# Patient Record
Sex: Female | Born: 1960 | Race: White | Hispanic: No | Marital: Married | Smoking: Current every day smoker
Health system: Southern US, Community
[De-identification: ages and names within clinical notes are randomized; demographics above are authoritative.]

## PROBLEM LIST (undated history)

## (undated) DIAGNOSIS — Z720 Tobacco use: Secondary | ICD-10-CM

## (undated) DIAGNOSIS — E785 Hyperlipidemia, unspecified: Secondary | ICD-10-CM

## (undated) DIAGNOSIS — E039 Hypothyroidism, unspecified: Secondary | ICD-10-CM

## (undated) DIAGNOSIS — L209 Atopic dermatitis, unspecified: Secondary | ICD-10-CM

## (undated) DIAGNOSIS — E119 Type 2 diabetes mellitus without complications: Secondary | ICD-10-CM

## (undated) DIAGNOSIS — I10 Essential (primary) hypertension: Secondary | ICD-10-CM

## (undated) DIAGNOSIS — J302 Other seasonal allergic rhinitis: Secondary | ICD-10-CM

## (undated) DIAGNOSIS — J45909 Unspecified asthma, uncomplicated: Secondary | ICD-10-CM

## (undated) DIAGNOSIS — K922 Gastrointestinal hemorrhage, unspecified: Secondary | ICD-10-CM

## (undated) DIAGNOSIS — F329 Major depressive disorder, single episode, unspecified: Secondary | ICD-10-CM

## (undated) DIAGNOSIS — I2510 Atherosclerotic heart disease of native coronary artery without angina pectoris: Secondary | ICD-10-CM

## (undated) DIAGNOSIS — F32A Depression, unspecified: Secondary | ICD-10-CM

## (undated) DIAGNOSIS — K219 Gastro-esophageal reflux disease without esophagitis: Secondary | ICD-10-CM

## (undated) DIAGNOSIS — L409 Psoriasis, unspecified: Secondary | ICD-10-CM

## (undated) DIAGNOSIS — J449 Chronic obstructive pulmonary disease, unspecified: Secondary | ICD-10-CM

## (undated) HISTORY — DX: Depression, unspecified: F32.A

## (undated) HISTORY — DX: Atherosclerotic heart disease of native coronary artery without angina pectoris: I25.10

## (undated) HISTORY — DX: Essential (primary) hypertension: I10

## (undated) HISTORY — DX: Major depressive disorder, single episode, unspecified: F32.9

## (undated) HISTORY — PX: DILATION AND CURETTAGE OF UTERUS: SHX78

## (undated) HISTORY — DX: Hypothyroidism, unspecified: E03.9

## (undated) HISTORY — DX: Tobacco use: Z72.0

## (undated) HISTORY — DX: Psoriasis, unspecified: L40.9

## (undated) HISTORY — DX: Type 2 diabetes mellitus without complications: E11.9

## (undated) HISTORY — PX: CORONARY ANGIOPLASTY WITH STENT PLACEMENT: SHX49

## (undated) HISTORY — PX: CARPAL TUNNEL RELEASE: SHX101

## (undated) HISTORY — DX: Gastro-esophageal reflux disease without esophagitis: K21.9

## (undated) HISTORY — DX: Hyperlipidemia, unspecified: E78.5

---

## 1997-06-23 ENCOUNTER — Other Ambulatory Visit: Admission: RE | Admit: 1997-06-23 | Discharge: 1997-06-23 | Payer: Self-pay | Admitting: Obstetrics and Gynecology

## 1997-08-02 ENCOUNTER — Ambulatory Visit (HOSPITAL_COMMUNITY): Admission: RE | Admit: 1997-08-02 | Discharge: 1997-08-02 | Payer: Self-pay | Admitting: Obstetrics and Gynecology

## 1997-08-08 ENCOUNTER — Ambulatory Visit (HOSPITAL_COMMUNITY): Admission: RE | Admit: 1997-08-08 | Discharge: 1997-08-08 | Payer: Self-pay | Admitting: Obstetrics and Gynecology

## 1998-03-01 ENCOUNTER — Other Ambulatory Visit: Admission: RE | Admit: 1998-03-01 | Discharge: 1998-03-01 | Payer: Self-pay | Admitting: *Deleted

## 1998-07-13 ENCOUNTER — Other Ambulatory Visit: Admission: RE | Admit: 1998-07-13 | Discharge: 1998-07-13 | Payer: Self-pay | Admitting: *Deleted

## 1999-03-25 ENCOUNTER — Other Ambulatory Visit: Admission: RE | Admit: 1999-03-25 | Discharge: 1999-03-25 | Payer: Self-pay | Admitting: *Deleted

## 1999-06-17 ENCOUNTER — Encounter: Payer: Self-pay | Admitting: *Deleted

## 1999-06-17 ENCOUNTER — Encounter: Admission: RE | Admit: 1999-06-17 | Discharge: 1999-06-17 | Payer: Self-pay | Admitting: *Deleted

## 2000-03-30 ENCOUNTER — Other Ambulatory Visit: Admission: RE | Admit: 2000-03-30 | Discharge: 2000-03-30 | Payer: Self-pay | Admitting: *Deleted

## 2000-08-27 ENCOUNTER — Encounter: Payer: Self-pay | Admitting: *Deleted

## 2000-08-27 ENCOUNTER — Encounter: Admission: RE | Admit: 2000-08-27 | Discharge: 2000-08-27 | Payer: Self-pay | Admitting: *Deleted

## 2000-11-10 ENCOUNTER — Other Ambulatory Visit: Admission: RE | Admit: 2000-11-10 | Discharge: 2000-11-10 | Payer: Self-pay | Admitting: *Deleted

## 2001-08-11 ENCOUNTER — Encounter: Admission: RE | Admit: 2001-08-11 | Discharge: 2001-08-11 | Payer: Self-pay | Admitting: Internal Medicine

## 2001-08-11 ENCOUNTER — Encounter: Payer: Self-pay | Admitting: Internal Medicine

## 2001-08-17 ENCOUNTER — Encounter: Admission: RE | Admit: 2001-08-17 | Discharge: 2001-09-02 | Payer: Self-pay | Admitting: Internal Medicine

## 2002-08-08 ENCOUNTER — Emergency Department (HOSPITAL_COMMUNITY): Admission: EM | Admit: 2002-08-08 | Discharge: 2002-08-08 | Payer: Self-pay | Admitting: Emergency Medicine

## 2003-01-22 ENCOUNTER — Emergency Department (HOSPITAL_COMMUNITY): Admission: EM | Admit: 2003-01-22 | Discharge: 2003-01-22 | Payer: Self-pay | Admitting: Emergency Medicine

## 2003-05-31 ENCOUNTER — Emergency Department (HOSPITAL_COMMUNITY): Admission: EM | Admit: 2003-05-31 | Discharge: 2003-05-31 | Payer: Self-pay | Admitting: Emergency Medicine

## 2003-06-06 ENCOUNTER — Emergency Department (HOSPITAL_COMMUNITY): Admission: EM | Admit: 2003-06-06 | Discharge: 2003-06-06 | Payer: Self-pay | Admitting: Family Medicine

## 2003-06-09 ENCOUNTER — Encounter (HOSPITAL_COMMUNITY): Admission: RE | Admit: 2003-06-09 | Discharge: 2003-09-07 | Payer: Self-pay | Admitting: Family Medicine

## 2003-10-23 ENCOUNTER — Encounter: Admission: RE | Admit: 2003-10-23 | Discharge: 2003-10-23 | Payer: Self-pay | Admitting: Occupational Medicine

## 2003-11-30 ENCOUNTER — Emergency Department (HOSPITAL_COMMUNITY): Admission: EM | Admit: 2003-11-30 | Discharge: 2003-11-30 | Payer: Self-pay | Admitting: Family Medicine

## 2004-05-06 ENCOUNTER — Emergency Department (HOSPITAL_COMMUNITY): Admission: EM | Admit: 2004-05-06 | Discharge: 2004-05-06 | Payer: Self-pay | Admitting: Family Medicine

## 2004-06-12 ENCOUNTER — Encounter: Admission: RE | Admit: 2004-06-12 | Discharge: 2004-06-12 | Payer: Self-pay | Admitting: Internal Medicine

## 2004-09-18 ENCOUNTER — Emergency Department (HOSPITAL_COMMUNITY): Admission: EM | Admit: 2004-09-18 | Discharge: 2004-09-18 | Payer: Self-pay | Admitting: Family Medicine

## 2005-01-07 ENCOUNTER — Emergency Department (HOSPITAL_COMMUNITY): Admission: EM | Admit: 2005-01-07 | Discharge: 2005-01-07 | Payer: Self-pay | Admitting: Family Medicine

## 2005-03-12 ENCOUNTER — Emergency Department (HOSPITAL_COMMUNITY): Admission: EM | Admit: 2005-03-12 | Discharge: 2005-03-12 | Payer: Self-pay | Admitting: Family Medicine

## 2005-05-21 ENCOUNTER — Emergency Department (HOSPITAL_COMMUNITY): Admission: EM | Admit: 2005-05-21 | Discharge: 2005-05-21 | Payer: Self-pay | Admitting: Family Medicine

## 2005-07-02 ENCOUNTER — Emergency Department (HOSPITAL_COMMUNITY): Admission: EM | Admit: 2005-07-02 | Discharge: 2005-07-02 | Payer: Self-pay | Admitting: Family Medicine

## 2005-08-01 ENCOUNTER — Other Ambulatory Visit: Admission: RE | Admit: 2005-08-01 | Discharge: 2005-08-01 | Payer: Self-pay | Admitting: Internal Medicine

## 2005-10-25 ENCOUNTER — Emergency Department (HOSPITAL_COMMUNITY): Admission: EM | Admit: 2005-10-25 | Discharge: 2005-10-25 | Payer: Self-pay | Admitting: Emergency Medicine

## 2006-04-12 ENCOUNTER — Emergency Department (HOSPITAL_COMMUNITY): Admission: EM | Admit: 2006-04-12 | Discharge: 2006-04-12 | Payer: Self-pay | Admitting: Emergency Medicine

## 2006-06-18 ENCOUNTER — Emergency Department (HOSPITAL_COMMUNITY): Admission: EM | Admit: 2006-06-18 | Discharge: 2006-06-18 | Payer: Self-pay | Admitting: Emergency Medicine

## 2006-06-24 ENCOUNTER — Emergency Department (HOSPITAL_COMMUNITY): Admission: EM | Admit: 2006-06-24 | Discharge: 2006-06-25 | Payer: Self-pay | Admitting: *Deleted

## 2006-08-18 ENCOUNTER — Emergency Department (HOSPITAL_COMMUNITY): Admission: EM | Admit: 2006-08-18 | Discharge: 2006-08-18 | Payer: Self-pay | Admitting: Emergency Medicine

## 2006-09-11 ENCOUNTER — Emergency Department (HOSPITAL_COMMUNITY): Admission: EM | Admit: 2006-09-11 | Discharge: 2006-09-11 | Payer: Self-pay | Admitting: Emergency Medicine

## 2007-01-05 ENCOUNTER — Emergency Department (HOSPITAL_COMMUNITY): Admission: EM | Admit: 2007-01-05 | Discharge: 2007-01-05 | Payer: Self-pay | Admitting: *Deleted

## 2007-01-20 ENCOUNTER — Emergency Department (HOSPITAL_COMMUNITY): Admission: EM | Admit: 2007-01-20 | Discharge: 2007-01-20 | Payer: Self-pay | Admitting: Emergency Medicine

## 2007-01-22 ENCOUNTER — Ambulatory Visit (HOSPITAL_COMMUNITY): Admission: RE | Admit: 2007-01-22 | Discharge: 2007-01-22 | Payer: Self-pay | Admitting: Emergency Medicine

## 2007-02-10 ENCOUNTER — Emergency Department (HOSPITAL_COMMUNITY): Admission: EM | Admit: 2007-02-10 | Discharge: 2007-02-10 | Payer: Self-pay | Admitting: Emergency Medicine

## 2007-04-29 ENCOUNTER — Emergency Department (HOSPITAL_COMMUNITY): Admission: EM | Admit: 2007-04-29 | Discharge: 2007-04-30 | Payer: Self-pay | Admitting: Emergency Medicine

## 2007-07-29 ENCOUNTER — Emergency Department (HOSPITAL_COMMUNITY): Admission: EM | Admit: 2007-07-29 | Discharge: 2007-07-29 | Payer: Self-pay | Admitting: Family Medicine

## 2007-09-05 ENCOUNTER — Emergency Department (HOSPITAL_COMMUNITY): Admission: EM | Admit: 2007-09-05 | Discharge: 2007-09-05 | Payer: Self-pay | Admitting: Emergency Medicine

## 2007-09-05 ENCOUNTER — Emergency Department (HOSPITAL_COMMUNITY): Admission: EM | Admit: 2007-09-05 | Discharge: 2007-09-05 | Payer: Self-pay | Admitting: Family Medicine

## 2007-11-27 ENCOUNTER — Emergency Department (HOSPITAL_COMMUNITY): Admission: EM | Admit: 2007-11-27 | Discharge: 2007-11-27 | Payer: Self-pay | Admitting: Family Medicine

## 2007-12-21 ENCOUNTER — Emergency Department (HOSPITAL_COMMUNITY): Admission: EM | Admit: 2007-12-21 | Discharge: 2007-12-21 | Payer: Self-pay | Admitting: Family Medicine

## 2008-01-16 ENCOUNTER — Emergency Department (HOSPITAL_COMMUNITY): Admission: EM | Admit: 2008-01-16 | Discharge: 2008-01-16 | Payer: Self-pay | Admitting: Emergency Medicine

## 2008-02-03 ENCOUNTER — Emergency Department (HOSPITAL_COMMUNITY): Admission: EM | Admit: 2008-02-03 | Discharge: 2008-02-04 | Payer: Self-pay | Admitting: Emergency Medicine

## 2008-03-16 ENCOUNTER — Emergency Department (HOSPITAL_COMMUNITY): Admission: EM | Admit: 2008-03-16 | Discharge: 2008-03-16 | Payer: Self-pay | Admitting: Family Medicine

## 2008-04-09 ENCOUNTER — Emergency Department (HOSPITAL_COMMUNITY): Admission: EM | Admit: 2008-04-09 | Discharge: 2008-04-09 | Payer: Self-pay | Admitting: Family Medicine

## 2008-04-17 ENCOUNTER — Emergency Department (HOSPITAL_COMMUNITY): Admission: EM | Admit: 2008-04-17 | Discharge: 2008-04-17 | Payer: Self-pay | Admitting: Emergency Medicine

## 2008-04-27 ENCOUNTER — Ambulatory Visit: Payer: Self-pay | Admitting: Internal Medicine

## 2008-04-27 ENCOUNTER — Encounter: Payer: Self-pay | Admitting: Internal Medicine

## 2008-04-27 DIAGNOSIS — J309 Allergic rhinitis, unspecified: Secondary | ICD-10-CM | POA: Insufficient documentation

## 2008-04-27 DIAGNOSIS — K219 Gastro-esophageal reflux disease without esophagitis: Secondary | ICD-10-CM | POA: Insufficient documentation

## 2008-04-27 DIAGNOSIS — E039 Hypothyroidism, unspecified: Secondary | ICD-10-CM | POA: Insufficient documentation

## 2008-04-27 DIAGNOSIS — E785 Hyperlipidemia, unspecified: Secondary | ICD-10-CM | POA: Insufficient documentation

## 2008-04-27 DIAGNOSIS — J45909 Unspecified asthma, uncomplicated: Secondary | ICD-10-CM | POA: Insufficient documentation

## 2008-04-27 DIAGNOSIS — F329 Major depressive disorder, single episode, unspecified: Secondary | ICD-10-CM | POA: Insufficient documentation

## 2008-04-27 DIAGNOSIS — F32A Depression, unspecified: Secondary | ICD-10-CM | POA: Insufficient documentation

## 2008-04-27 DIAGNOSIS — R21 Rash and other nonspecific skin eruption: Secondary | ICD-10-CM | POA: Insufficient documentation

## 2008-04-28 ENCOUNTER — Encounter: Payer: Self-pay | Admitting: Internal Medicine

## 2008-04-28 LAB — CONVERTED CEMR LAB
Basophils Absolute: 0.1 10*3/uL (ref 0.0–0.1)
Basophils Relative: 0 % (ref 0–1)
Eosinophils Absolute: 0.6 10*3/uL (ref 0.0–0.7)
Eosinophils Relative: 5 % (ref 0–5)
HCT: 44.4 % (ref 36.0–46.0)
Hemoglobin: 15.3 g/dL — ABNORMAL HIGH (ref 12.0–15.0)
Lymphocytes Relative: 33 % (ref 12–46)
Lymphs Abs: 3.9 10*3/uL (ref 0.7–4.0)
MCHC: 34.5 g/dL (ref 30.0–36.0)
MCV: 92.7 fL (ref 78.0–100.0)
Monocytes Absolute: 0.9 10*3/uL (ref 0.1–1.0)
Monocytes Relative: 8 % (ref 3–12)
Neutro Abs: 6.5 10*3/uL (ref 1.7–7.7)
Neutrophils Relative %: 54 % (ref 43–77)
Platelets: 262 10*3/uL (ref 150–400)
RBC: 4.79 M/uL (ref 3.87–5.11)
RDW: 13.4 % (ref 11.5–15.5)
WBC: 12 10*3/uL — ABNORMAL HIGH (ref 4.0–10.5)

## 2008-05-09 LAB — CONVERTED CEMR LAB
ALT: 29 units/L (ref 0–35)
AST: 22 units/L (ref 0–37)
Albumin: 4.4 g/dL (ref 3.5–5.2)
Alkaline Phosphatase: 83 units/L (ref 39–117)
BUN: 13 mg/dL (ref 6–23)
CO2: 24 meq/L (ref 19–32)
Calcium: 9.6 mg/dL (ref 8.4–10.5)
Chloride: 103 meq/L (ref 96–112)
Cholesterol: 231 mg/dL — ABNORMAL HIGH (ref 0–200)
Creatinine, Ser: 0.79 mg/dL (ref 0.40–1.20)
Free T4: 0.63 ng/dL — ABNORMAL LOW (ref 0.89–1.80)
Glucose, Bld: 111 mg/dL — ABNORMAL HIGH (ref 70–99)
HDL: 32 mg/dL — ABNORMAL LOW (ref >39)
LDL Cholesterol: 148 mg/dL — ABNORMAL HIGH (ref 0–99)
Potassium: 4.2 meq/L (ref 3.5–5.3)
Sodium: 142 meq/L (ref 135–145)
TSH: 12.089 microintl units/mL — ABNORMAL HIGH (ref 0.350–4.50)
Total Bilirubin: 0.3 mg/dL (ref 0.3–1.2)
Total CHOL/HDL Ratio: 7.2
Total Protein: 7.1 g/dL (ref 6.0–8.3)
Triglycerides: 254 mg/dL — ABNORMAL HIGH (ref <150)
VLDL: 51 mg/dL — ABNORMAL HIGH (ref 0–40)

## 2008-05-10 ENCOUNTER — Encounter (INDEPENDENT_AMBULATORY_CARE_PROVIDER_SITE_OTHER): Payer: Self-pay | Admitting: *Deleted

## 2008-05-10 ENCOUNTER — Encounter: Payer: Self-pay | Admitting: Internal Medicine

## 2008-05-10 ENCOUNTER — Other Ambulatory Visit: Admission: RE | Admit: 2008-05-10 | Discharge: 2008-05-10 | Payer: Self-pay | Admitting: *Deleted

## 2008-05-10 ENCOUNTER — Ambulatory Visit: Payer: Self-pay | Admitting: *Deleted

## 2008-05-10 DIAGNOSIS — I152 Hypertension secondary to endocrine disorders: Secondary | ICD-10-CM | POA: Insufficient documentation

## 2008-05-10 DIAGNOSIS — I10 Essential (primary) hypertension: Secondary | ICD-10-CM

## 2008-05-10 DIAGNOSIS — E1159 Type 2 diabetes mellitus with other circulatory complications: Secondary | ICD-10-CM | POA: Insufficient documentation

## 2008-05-11 ENCOUNTER — Encounter (INDEPENDENT_AMBULATORY_CARE_PROVIDER_SITE_OTHER): Payer: Self-pay | Admitting: *Deleted

## 2008-06-10 ENCOUNTER — Emergency Department (HOSPITAL_COMMUNITY): Admission: EM | Admit: 2008-06-10 | Discharge: 2008-06-10 | Payer: Self-pay | Admitting: Family Medicine

## 2008-06-20 ENCOUNTER — Ambulatory Visit: Payer: Self-pay | Admitting: Internal Medicine

## 2008-06-20 ENCOUNTER — Encounter: Payer: Self-pay | Admitting: Internal Medicine

## 2008-06-20 DIAGNOSIS — R82998 Other abnormal findings in urine: Secondary | ICD-10-CM | POA: Insufficient documentation

## 2008-06-20 DIAGNOSIS — K921 Melena: Secondary | ICD-10-CM | POA: Insufficient documentation

## 2008-06-20 DIAGNOSIS — J069 Acute upper respiratory infection, unspecified: Secondary | ICD-10-CM | POA: Insufficient documentation

## 2008-06-21 ENCOUNTER — Encounter: Payer: Self-pay | Admitting: Internal Medicine

## 2008-06-26 LAB — CONVERTED CEMR LAB
BUN: 13 mg/dL (ref 6–23)
Bacteria, UA: NONE SEEN
Bilirubin Urine: NEGATIVE
CO2: 23 meq/L (ref 19–32)
Calcium: 9.5 mg/dL (ref 8.4–10.5)
Chloride: 103 meq/L (ref 96–112)
Creatinine, Ser: 0.8 mg/dL (ref 0.40–1.20)
Glucose, Bld: 146 mg/dL — ABNORMAL HIGH (ref 70–99)
Hemoglobin, Urine: NEGATIVE
Ketones, ur: NEGATIVE mg/dL
Leukocytes, UA: NEGATIVE
Nitrite: NEGATIVE
Potassium: 4.2 meq/L (ref 3.5–5.3)
Protein, ur: 100 mg/dL — AB
Sodium: 139 meq/L (ref 135–145)
Specific Gravity, Urine: 1.02 (ref 1.005–1.030)
TSH: 3.105 microintl units/mL (ref 0.350–4.500)
Urine Glucose: NEGATIVE mg/dL
Urobilinogen, UA: 0.2 (ref 0.0–1.0)
WBC, UA: NONE SEEN cells/hpf (ref <3)
pH: 6 (ref 5.0–8.0)

## 2008-06-27 ENCOUNTER — Ambulatory Visit: Payer: Self-pay | Admitting: Internal Medicine

## 2008-06-27 DIAGNOSIS — R3129 Other microscopic hematuria: Secondary | ICD-10-CM | POA: Insufficient documentation

## 2008-06-27 LAB — CONVERTED CEMR LAB: Hgb A1c MFr Bld: 6.1 %

## 2008-06-29 ENCOUNTER — Ambulatory Visit: Payer: Self-pay | Admitting: Internal Medicine

## 2008-06-29 ENCOUNTER — Encounter (INDEPENDENT_AMBULATORY_CARE_PROVIDER_SITE_OTHER): Payer: Self-pay | Admitting: Internal Medicine

## 2008-06-30 LAB — CONVERTED CEMR LAB
BUN: 16 mg/dL (ref 6–23)
CO2: 26 meq/L (ref 19–32)
Calcium: 9.5 mg/dL (ref 8.4–10.5)
Chloride: 105 meq/L (ref 96–112)
Creatinine, Ser: 0.74 mg/dL (ref 0.40–1.20)
GFR calc Af Amer: >60 mL/min (ref >60)
GFR calc non Af Amer: >60 mL/min (ref >60)
Glucose, Bld: 108 mg/dL — ABNORMAL HIGH (ref 70–99)
Potassium: 4 meq/L (ref 3.5–5.3)
Sodium: 140 meq/L (ref 135–145)

## 2008-07-25 ENCOUNTER — Ambulatory Visit: Payer: Self-pay | Admitting: Internal Medicine

## 2008-07-25 ENCOUNTER — Encounter: Payer: Self-pay | Admitting: Internal Medicine

## 2008-07-25 LAB — HM PAP SMEAR

## 2008-08-24 ENCOUNTER — Ambulatory Visit: Payer: Self-pay | Admitting: Internal Medicine

## 2008-08-24 DIAGNOSIS — M25519 Pain in unspecified shoulder: Secondary | ICD-10-CM | POA: Insufficient documentation

## 2008-09-06 ENCOUNTER — Telehealth: Payer: Self-pay | Admitting: Infectious Diseases

## 2008-09-19 ENCOUNTER — Emergency Department (HOSPITAL_COMMUNITY): Admission: EM | Admit: 2008-09-19 | Discharge: 2008-09-19 | Payer: Self-pay | Admitting: Family Medicine

## 2008-10-18 ENCOUNTER — Ambulatory Visit: Payer: Self-pay | Admitting: Internal Medicine

## 2008-11-01 ENCOUNTER — Ambulatory Visit: Payer: Self-pay | Admitting: Internal Medicine

## 2008-11-01 DIAGNOSIS — M25559 Pain in unspecified hip: Secondary | ICD-10-CM | POA: Insufficient documentation

## 2008-11-28 ENCOUNTER — Ambulatory Visit: Payer: Self-pay | Admitting: Internal Medicine

## 2008-12-13 ENCOUNTER — Ambulatory Visit (HOSPITAL_COMMUNITY): Admission: RE | Admit: 2008-12-13 | Discharge: 2008-12-13 | Payer: Self-pay | Admitting: Internal Medicine

## 2008-12-13 LAB — HM MAMMOGRAPHY: HM Mammogram: NEGATIVE

## 2009-01-01 ENCOUNTER — Telehealth: Payer: Self-pay | Admitting: Internal Medicine

## 2009-01-17 ENCOUNTER — Ambulatory Visit: Payer: Self-pay | Admitting: Infectious Disease

## 2009-01-18 ENCOUNTER — Ambulatory Visit: Payer: Self-pay | Admitting: Internal Medicine

## 2009-01-18 LAB — CONVERTED CEMR LAB
ALT: 30 units/L (ref 0–35)
AST: 24 units/L (ref 0–37)
Albumin: 4.3 g/dL (ref 3.5–5.2)
Alkaline Phosphatase: 107 units/L (ref 39–117)
BUN: 11 mg/dL (ref 6–23)
CO2: 22 meq/L (ref 19–32)
Calcium: 9.7 mg/dL (ref 8.4–10.5)
Chloride: 102 meq/L (ref 96–112)
Cholesterol: 214 mg/dL — ABNORMAL HIGH (ref 0–200)
Creatinine, Ser: 0.68 mg/dL (ref 0.40–1.20)
Glucose, Bld: 113 mg/dL — ABNORMAL HIGH (ref 70–99)
HDL: 34 mg/dL — ABNORMAL LOW (ref >39)
LDL Cholesterol: 148 mg/dL — ABNORMAL HIGH (ref 0–99)
Potassium: 4.8 meq/L (ref 3.5–5.3)
Sodium: 139 meq/L (ref 135–145)
TSH: 25.8 microintl units/mL — ABNORMAL HIGH (ref 0.350–4.5)
Total Bilirubin: 0.3 mg/dL (ref 0.3–1.2)
Total CHOL/HDL Ratio: 6.3
Total Protein: 6.8 g/dL (ref 6.0–8.3)
Triglycerides: 162 mg/dL — ABNORMAL HIGH (ref <150)
VLDL: 32 mg/dL (ref 0–40)

## 2009-01-19 ENCOUNTER — Telehealth: Payer: Self-pay | Admitting: Internal Medicine

## 2009-01-22 ENCOUNTER — Telehealth: Payer: Self-pay | Admitting: Internal Medicine

## 2009-01-25 ENCOUNTER — Emergency Department (HOSPITAL_COMMUNITY): Admission: EM | Admit: 2009-01-25 | Discharge: 2009-01-25 | Payer: Self-pay | Admitting: Emergency Medicine

## 2009-01-29 ENCOUNTER — Ambulatory Visit: Payer: Self-pay | Admitting: Internal Medicine

## 2009-01-31 ENCOUNTER — Telehealth (INDEPENDENT_AMBULATORY_CARE_PROVIDER_SITE_OTHER): Payer: Self-pay | Admitting: *Deleted

## 2009-01-31 ENCOUNTER — Ambulatory Visit: Payer: Self-pay | Admitting: Internal Medicine

## 2009-01-31 ENCOUNTER — Encounter: Payer: Self-pay | Admitting: Internal Medicine

## 2009-02-05 ENCOUNTER — Telehealth: Payer: Self-pay | Admitting: Internal Medicine

## 2009-02-06 ENCOUNTER — Telehealth: Payer: Self-pay | Admitting: Internal Medicine

## 2009-03-06 ENCOUNTER — Emergency Department (HOSPITAL_COMMUNITY): Admission: EM | Admit: 2009-03-06 | Discharge: 2009-03-06 | Payer: Self-pay | Admitting: Emergency Medicine

## 2009-03-08 ENCOUNTER — Emergency Department (HOSPITAL_COMMUNITY): Admission: EM | Admit: 2009-03-08 | Discharge: 2009-03-08 | Payer: Self-pay | Admitting: Family Medicine

## 2009-05-28 ENCOUNTER — Emergency Department (HOSPITAL_COMMUNITY): Admission: EM | Admit: 2009-05-28 | Discharge: 2009-05-28 | Payer: Self-pay | Admitting: Emergency Medicine

## 2009-05-29 ENCOUNTER — Telehealth: Payer: Self-pay | Admitting: Internal Medicine

## 2009-05-29 ENCOUNTER — Ambulatory Visit: Payer: Self-pay | Admitting: Internal Medicine

## 2009-05-29 DIAGNOSIS — J441 Chronic obstructive pulmonary disease with (acute) exacerbation: Secondary | ICD-10-CM | POA: Insufficient documentation

## 2009-06-11 ENCOUNTER — Telehealth: Payer: Self-pay | Admitting: Internal Medicine

## 2009-06-28 ENCOUNTER — Telehealth: Payer: Self-pay | Admitting: Internal Medicine

## 2009-06-30 ENCOUNTER — Emergency Department (HOSPITAL_COMMUNITY): Admission: EM | Admit: 2009-06-30 | Discharge: 2009-06-30 | Payer: Self-pay | Admitting: Family Medicine

## 2009-07-05 ENCOUNTER — Emergency Department (HOSPITAL_COMMUNITY): Admission: EM | Admit: 2009-07-05 | Discharge: 2009-07-05 | Payer: Self-pay | Admitting: Emergency Medicine

## 2009-07-16 ENCOUNTER — Encounter: Payer: Self-pay | Admitting: Internal Medicine

## 2009-07-16 ENCOUNTER — Ambulatory Visit: Payer: Self-pay | Admitting: Internal Medicine

## 2009-07-20 LAB — CONVERTED CEMR LAB: TSH: 7.908 microintl units/mL — ABNORMAL HIGH (ref 0.350–4.5)

## 2009-08-01 ENCOUNTER — Emergency Department (HOSPITAL_COMMUNITY): Admission: EM | Admit: 2009-08-01 | Discharge: 2009-08-02 | Payer: Self-pay | Admitting: Emergency Medicine

## 2009-08-17 ENCOUNTER — Telehealth: Payer: Self-pay | Admitting: *Deleted

## 2009-08-20 ENCOUNTER — Ambulatory Visit: Payer: Self-pay | Admitting: Internal Medicine

## 2009-08-28 ENCOUNTER — Telehealth: Payer: Self-pay | Admitting: *Deleted

## 2009-11-01 ENCOUNTER — Telehealth: Payer: Self-pay | Admitting: Internal Medicine

## 2009-11-12 ENCOUNTER — Emergency Department (HOSPITAL_COMMUNITY): Admission: EM | Admit: 2009-11-12 | Discharge: 2009-11-12 | Payer: Self-pay | Admitting: Emergency Medicine

## 2009-11-14 ENCOUNTER — Emergency Department (HOSPITAL_COMMUNITY): Admission: EM | Admit: 2009-11-14 | Discharge: 2009-11-14 | Payer: Self-pay | Admitting: Emergency Medicine

## 2009-11-14 ENCOUNTER — Telehealth: Payer: Self-pay | Admitting: Internal Medicine

## 2009-11-21 ENCOUNTER — Ambulatory Visit: Payer: Self-pay | Admitting: Internal Medicine

## 2009-11-21 DIAGNOSIS — N390 Urinary tract infection, site not specified: Secondary | ICD-10-CM | POA: Insufficient documentation

## 2009-11-21 LAB — CONVERTED CEMR LAB
BUN: 12 mg/dL (ref 6–23)
CO2: 28 meq/L (ref 19–32)
Calcium: 9.7 mg/dL (ref 8.4–10.5)
Chloride: 103 meq/L (ref 96–112)
Creatinine, Ser: 0.83 mg/dL (ref 0.40–1.20)
Glucose, Bld: 128 mg/dL — ABNORMAL HIGH (ref 70–99)
Potassium: 4.2 meq/L (ref 3.5–5.3)
Sodium: 140 meq/L (ref 135–145)

## 2009-11-28 ENCOUNTER — Ambulatory Visit: Payer: Self-pay | Admitting: Internal Medicine

## 2009-11-30 ENCOUNTER — Telehealth (INDEPENDENT_AMBULATORY_CARE_PROVIDER_SITE_OTHER): Payer: Self-pay | Admitting: *Deleted

## 2009-12-01 ENCOUNTER — Emergency Department (HOSPITAL_COMMUNITY): Admission: EM | Admit: 2009-12-01 | Discharge: 2009-12-01 | Payer: Self-pay | Admitting: Emergency Medicine

## 2009-12-18 ENCOUNTER — Encounter: Payer: Self-pay | Admitting: Internal Medicine

## 2009-12-28 ENCOUNTER — Telehealth: Payer: Self-pay | Admitting: Internal Medicine

## 2009-12-31 ENCOUNTER — Telehealth: Payer: Self-pay | Admitting: Internal Medicine

## 2010-01-02 ENCOUNTER — Encounter: Payer: Self-pay | Admitting: Internal Medicine

## 2010-02-13 ENCOUNTER — Ambulatory Visit: Payer: Self-pay | Admitting: Internal Medicine

## 2010-02-13 DIAGNOSIS — R42 Dizziness and giddiness: Secondary | ICD-10-CM | POA: Insufficient documentation

## 2010-02-22 ENCOUNTER — Encounter: Payer: Self-pay | Admitting: Family Medicine

## 2010-02-22 ENCOUNTER — Ambulatory Visit: Payer: Self-pay | Admitting: Family Medicine

## 2010-03-06 LAB — CONVERTED CEMR LAB
ALT: 19 units/L (ref 0–35)
AST: 17 units/L (ref 0–37)
Albumin: 4.3 g/dL (ref 3.5–5.2)
Alkaline Phosphatase: 104 units/L (ref 39–117)
BUN: 15 mg/dL (ref 6–23)
CO2: 29 meq/L (ref 19–32)
Calcium: 9.8 mg/dL (ref 8.4–10.5)
Chloride: 103 meq/L (ref 96–112)
Cholesterol: 202 mg/dL — ABNORMAL HIGH (ref 0–200)
Creatinine, Ser: 0.71 mg/dL (ref 0.40–1.20)
Glucose, Bld: 135 mg/dL — ABNORMAL HIGH (ref 70–99)
HDL: 32 mg/dL — ABNORMAL LOW (ref >39)
LDL Cholesterol: 121 mg/dL — ABNORMAL HIGH (ref 0–99)
Potassium: 4.7 meq/L (ref 3.5–5.3)
Sodium: 140 meq/L (ref 135–145)
TSH: 6.177 microintl units/mL — ABNORMAL HIGH (ref 0.350–4.5)
Total Bilirubin: 0.3 mg/dL (ref 0.3–1.2)
Total CHOL/HDL Ratio: 6.3
Total Protein: 6.8 g/dL (ref 6.0–8.3)
Triglycerides: 245 mg/dL — ABNORMAL HIGH (ref <150)
VLDL: 49 mg/dL — ABNORMAL HIGH (ref 0–40)

## 2010-03-10 DIAGNOSIS — E118 Type 2 diabetes mellitus with unspecified complications: Secondary | ICD-10-CM | POA: Insufficient documentation

## 2010-03-14 ENCOUNTER — Ambulatory Visit: Admission: RE | Admit: 2010-03-14 | Discharge: 2010-03-14 | Payer: Self-pay | Source: Home / Self Care

## 2010-03-14 DIAGNOSIS — M542 Cervicalgia: Secondary | ICD-10-CM | POA: Insufficient documentation

## 2010-03-14 DIAGNOSIS — B351 Tinea unguium: Secondary | ICD-10-CM | POA: Insufficient documentation

## 2010-04-11 ENCOUNTER — Ambulatory Visit: Payer: Self-pay | Admitting: Internal Medicine

## 2010-04-11 NOTE — Assessment & Plan Note (Signed)
Summary: FU VISIT/DS   Vital Signs:  Patient profile:   50 year old female Height:      64 inches (162.56 cm) Weight:      251.2 pounds (114.18 kg) BMI:     43.27 Temp:     97.4 degrees F (36.33 degrees C) oral Pulse rate:   103 / minute BP sitting:   142 / 88  (right arm)  Vitals Entered By: Morrison Old RN (June 20, 2008 3:09 PM) CC: F/U on ?scabies - states bumps/itching resolved. States she has not had any childhood  diseases."Problem with vertigo" Is Patient Diabetic? No Pain Assessment Patient in pain? no      Nutritional Status BMI of > 30 = obese  Have you ever been in a relationship where you felt threatened, hurt or afraid?No   Does patient need assistance? Functional Status Self care Ambulation Normal   CC:  F/U on ?scabies - states bumps/itching resolved. States she has not had any childhood  diseases."Problem with vertigo".  History of Present Illness: 50 yr old woman with pmhx as described below comes to the clinic for follow up. Patient states that itching and rash has resolved.    Patient has compliants of chest congestion, coughing, clear sputum for 14 days. Associated with wheezing and subjective fevers. Last fever was sunday night.  Patient went to the urgent center and was prescribed Iophen C NR liquid, and erythromycin.  The wheezing has been controlled with albuterol mdi. Patient states to have a nebulizer at home but can't afford the liquid formulation of bronchodilators. Patient continues to smoke cigarettes.  Patient reports that  last week during a bowel movement she saw some blood in the toilet mixed with the bowel. Described as bright red blood. Only about a teaspoon of blood. Patient has not menstruated in 7 years. Denies weight loss or family history of colon cancer.  Patient also complains of having strong smelling urine, and frequency but denies dysuria.  Depression History:      The patient denies a depressed mood most of the day and a  diminished interest in her usual daily activities.         Preventive Screening-Counseling & Management     Alcohol type: occasionally     Smoking Status: current     Smoking Cessation Counseling: yes     Packs/Day: <1     Year Started: smoking 33 yrs     Does Patient Exercise: no  Problems Prior to Update: 1)  Tobacco Abuse  (ICD-305.1) 2)  Elevated Blood Pressure Without Diagnosis of Hypertension  (ICD-796.2) 3)  Skin Rash  (ICD-782.1) 4)  Family History Diabetes 1st Degree Relative  (ICD-V18.0) 5)  Hypothyroidism  (ICD-244.9) 6)  Hyperlipidemia  (ICD-272.4) 7)  Gerd  (ICD-530.81) 8)  Depression  (ICD-311) 9)  Asthma  (ICD-493.90) 10)  Allergic Rhinitis  (ICD-477.9)  Medications Prior to Update: 1)  Ventolin Hfa 108 (90 Base) Mcg/act Aers (Albuterol Sulfate) .... 2 Puffs Inhaled Every 4-6 Hours As Needed 2)  Fish Oil 1000 Mg Caps (Omega-3 Fatty Acids) .... Takes 2 Tablets By Mouth Daily 3)  Aspirin 81 Mg Tabs (Aspirin) .... Take One Tablet By Mouth Daily 4)  Cod Liver Oil 1000 Mg Caps (Cod Liver Oil) 5)  Chlor-Trimeton Allergy 12 Mg Cr-Tabs (Chlorpheniramine Maleate) .... Take Two Tablets By Mouth Dailly 6)  Vitamin B-6 250 Mg Tabs (Pyridoxine Hcl) .... Take Two Tables By Mouth Daily 7)  Dramamine 50 Mg Tabs (Dimenhydrinate) .... Taking  2 Tablets By Mouth Daily 8)  Hydrocortisone 2.5 % Oint (Hydrocortisone) .... Apply Thin Layer Over Area of Itching Twice A Day 9)  Celexa 20 Mg Tabs (Citalopram Hydrobromide) .... Take One Tablet By Mouth Daily 10)  Synthroid 50 Mcg Tabs (Levothyroxine Sodium) .... Take 1 Tablet By Mouth Once A Day 11)  Lisinopril 5 Mg Tabs (Lisinopril) .... Take 1 Tablet By Mouth Once A Day  Current Medications (verified): 1)  Ventolin Hfa 108 (90 Base) Mcg/act Aers (Albuterol Sulfate) .... 2 Puffs Inhaled Every 4-6 Hours As Needed 2)  Fish Oil 1000 Mg Caps (Omega-3 Fatty Acids) .... Takes 2 Tablets By Mouth Daily 3)  Aspirin 81 Mg Tabs (Aspirin) .... Take  One Tablet By Mouth Daily 4)  Cod Liver Oil 1000 Mg Caps (Cod Liver Oil) 5)  Chlor-Trimeton Allergy 12 Mg Cr-Tabs (Chlorpheniramine Maleate) .... Take Two Tablets By Mouth Dailly 6)  Vitamin B-6 250 Mg Tabs (Pyridoxine Hcl) .... Take Two Tables By Mouth Daily 7)  Dramamine 50 Mg Tabs (Dimenhydrinate) .... Taking 2 Tablets By Mouth Daily 8)  Hydrocortisone 2.5 % Oint (Hydrocortisone) .... Apply Thin Layer Over Area of Itching Twice A Day 9)  Celexa 20 Mg Tabs (Citalopram Hydrobromide) .... Take One Tablet By Mouth Daily 10)  Synthroid 50 Mcg Tabs (Levothyroxine Sodium) .... Take 1 Tablet By Mouth Once A Day 11)  Lisinopril 5 Mg Tabs (Lisinopril) .... Take 1 Tablet By Mouth Once A Day 12)  E.e.s. 400 400 Mg Tabs (Erythromycin Ethylsuccinate) .... Take 1 Tablet By Mouth Every 6 Hours  Allergies: 1)  ! Vicodin  Past History:  Past Medical History:    Carpal Tunnel syndrome (right hand surgery 01-Jun-1996, left hand surgery 1997/06/01)    Allergic rhinitis    Asthma    Depression    GERD    Hyperlipidemia    Hypothyroidism    Psoriasis  (04/27/2008)  Family History:    Family History Diabetes 1st degree relative    Family History Hypertension    Family History of Father died of Suicide (gunshot)  in 06-01-1996     (04/27/2008)  Social History:    Married    Current Smoker    Alcohol use-no    Drug use-no     (04/27/2008)  Risk Factors:    Alcohol Use: N/A    >5 drinks/d w/in last 3 months: N/A    Caffeine Use: N/A    Diet: N/A    Exercise: no (06/20/2008)  Risk Factors:    Smoking Status: current (06/20/2008)    Packs/Day: <1 (06/20/2008)    Cigars/wk: N/A    Pipe Use/wk: N/A    Cans of tobacco/wk: N/A    Passive Smoke Exposure: N/A  Family History:    Reviewed history from 04/27/2008 and no changes required:       Family History Diabetes 1st degree relative       Family History Hypertension       Family History of Father died of Suicide (gunshot)  in [place] History:     Reviewed history from 04/27/2008 and no changes required:       Married       Current Smoker       Alcohol use-no       Drug use-no  Review of Systems       The patient complains of hematochezia.  The patient denies fever, chest pain, dyspnea on exertion, peripheral edema, headaches, abdominal pain, and weight loss.  Physical Exam  General:  overweight-appearing.   Ears:  no external deformities.   Nose:  no external deformity.   Mouth:  pharynx pink and moist, no erythema, and no exudates.   Neck:  supple and full ROM.   Lungs:  No wheezes, rhonchi or crackles.  Heart:  Normal rate and regular rhythm. S1 and S2 normal without gallop, murmur, click, rub or other extra sounds. Abdomen:  soft, non-tender, and normal bowel sounds.   Rectal:  no external abnormalities, no hemorrhoids, normal sphincter tone, no masses, and no tenderness.  Kaufmann stool in vault. No melena or hematochezia. Hemoccult negative Msk:  no CVA tenderness bilaterally Skin:  scaly rash on hands     Impression & Recommendations:  Problem # 1:  UPPER RESPIRATORY INFECTION (ICD-465.9) Resolved. Patient's respiratory exam is normal and there are no exudates or erythema on examination on oropharynx.  No further treatment is needed. Don't think that patient even needs antibiotics but since she started taking them will have patient finish the course. Will have patient continue using albuterol inhaler as directed for wheezing or shortness of breath. Patient was instructed to return to the clinic if she develops worsening of shortness of breath, and was told that smoking is counterproductive and needed to be stopped. Will continue to provide smoking cessation tips and counseling on subsequent visits.  Her updated medication list for this problem includes:    Aspirin 81 Mg Tabs (Aspirin) .Marland Kitchen... Take one tablet by mouth daily    Chlor-trimeton Allergy 12 Mg Cr-tabs (Chlorpheniramine maleate) .Marland Kitchen... Take two tablets by mouth  dailly  Problem # 2:  SKIN RASH (ICD-782.1) Resolved. Skin biopsy results where discussed with patient.   Her updated medication list for this problem includes:    Hydrocortisone 2.5 % Oint (Hydrocortisone) .Marland Kitchen... Apply thin layer over area of itching twice a day  Problem # 3:  HEMATOCHEZIA (ICD-578.1) Rectal exam performed. Results as stated above. Recommended that patient have a colonoscopy done. Patient at this time  deferred referral to GI for colonoscopy because she has no insurance and would not be able to pay visit. Expressed my concern for colon cancer and the importance of screening. Patient understood consequences of not getting a colonoscopy done as soon as possible. Instructed patient to talk to Clarice Pole to see if she can get registered for card. Patient has receptive and agreed to take to [facility]. Patient was scheduled to meet with Clarice Pole on April 16. Will further inquire about card registration and schedule colonoscopy during the next visit.  Problem # 4:  OTHER NONSPECIFIC FINDING EXAMINATION OF URINE (ICD-791.9) Strong smell of urine and frequency concerning for urinary tract infection. Will further investigate symptoms. Will start proper antibiotic treatment if any evidence of urinary infection on labs. Will follow up.  Orders: T-Urinalysis ([id number]) T-Culture, Urine ([id number]) T-Basic Metabolic Panel ([id number]) T-TSH [phone])  Problem # 5:  HYPERTENSION (ICD-401.9) BP better almost at goal <140/90. No changes to medication today. Patient had not returned to clinic since starting medication. Will get bmet today. If BP still not at goal next visit will consider increasing lisinopril. Will continue to follow.  Her updated medication list for this problem includes:    Lisinopril 5 Mg Tabs (Lisinopril) .Marland Kitchen... Take 1 tablet by mouth once a day  BP today: 142/88 Prior BP: 149/96 (05/10/2008)  Labs Reviewed: K+: 4.2 (04/28/2008) Creat: : 0.79  (04/28/2008)   Chol: 231 (04/28/2008)   HDL: 32 (04/28/2008)   LDL: 148 (  04/28/2008)   TG: 254 (04/28/2008)  Problem # 6:  HYPOTHYROIDISM (ICD-244.9) 6 weeks since starting medication. Will check TSH and further titrate medication if needed.  Her updated medication list for this problem includes:    Synthroid 50 Mcg Tabs (Levothyroxine sodium) .Marland Kitchen... Take 1 tablet by mouth once a day  Labs Reviewed: TSH: 12.089 (04/28/2008)    Chol: 231 (04/28/2008)   HDL: 32 (04/28/2008)   LDL: 148 (04/28/2008)   TG: 254 (04/28/2008)  Problem # 7:  DEPRESSION (ICD-311) Mood has improved since starting medication. Continue current treatment. No HSI/SSI.  Her updated medication list for this problem includes:    Celexa 20 Mg Tabs (Citalopram hydrobromide) .Marland Kitchen... Take one tablet by mouth daily  Complete Medication List: 1)  Ventolin Hfa 108 (90 Base) Mcg/act Aers (Albuterol sulfate) .... 2 puffs inhaled every 4-6 hours as needed 2)  Fish Oil 1000 Mg Caps (Omega-3 fatty acids) .... Takes 2 tablets by mouth daily 3)  Aspirin 81 Mg Tabs (Aspirin) .... Take one tablet by mouth daily 4)  Cod Liver Oil 1000 Mg Caps (Cod liver oil) 5)  Chlor-trimeton Allergy 12 Mg Cr-tabs (Chlorpheniramine maleate) .... Take two tablets by mouth dailly 6)  Vitamin B-6 250 Mg Tabs (Pyridoxine hcl) .... Take two tables by mouth daily 7)  Dramamine 50 Mg Tabs (Dimenhydrinate) .... Taking 2 tablets by mouth daily 8)  Hydrocortisone 2.5 % Oint (Hydrocortisone) .... Apply thin layer over area of itching twice a day 9)  Celexa 20 Mg Tabs (Citalopram hydrobromide) .... Take one tablet by mouth daily 10)  Synthroid 50 Mcg Tabs (Levothyroxine sodium) .... Take 1 tablet by mouth once a day 11)  Lisinopril 5 Mg Tabs (Lisinopril) .... Take 1 tablet by mouth once a day 12)  E.e.s. 400 400 Mg Tabs (Erythromycin ethylsuccinate) .... Take 1 tablet by mouth every 6 hours  Patient Instructions: 1)  Please schedule a follow-up appointment in 1  month. 2)  You will be called with any abnormalities in the tests scheduled or performed today.  If you don't hear from [place] within a week from when the test was performed, you can assume that your test was normal.  3)  Continue taking all medication as indicated.

## 2010-04-11 NOTE — Assessment & Plan Note (Signed)
Summary: NP,RT SHOULDER PAIN,MC   Vital Signs:  Patient profile:   50 year old female Height:      63 inches Weight:      253 pounds BMI:     44.98 Pulse rate:   97 / minute BP sitting:   107 / 75  (right arm)  Vitals Entered By: Caesar Chestnut RN (February 22, 2010 10:38 AM) CC: rt anterior shoulder pain   Primary Care Provider:  Kathlen Brunswick MD  CC:  rt anterior shoulder pain.  History of Present Illness: Pt presents to clinic for evaluation of rt shoulder pain, anterior and lateral location.  States she has experienced the pain since 2008.  Has hx of bone spur removal on rt shoulder done by Dr. Noemi Chapel in 1998.  Pt states the pain is worse at rest, particularly when lying on that side.  She takes 2-3 aleve two times a day for pain, and this is helpful.  Is able to work in yard with minimal issue, but does have problem lifting things in front and over her head.  Has prior SA CSI with great benefit and would like to try again.  Habits & Providers  Alcohol-Tobacco-Diet     Tobacco Status: current     Tobacco Counseling: to quit use of tobacco products     Cigarette Packs/Day: 0.5  Allergies: 1)  ! Vicodin 2)  ! Tramadol Hcl  Social History: Smoking Status:  current  Physical Exam  General:  overweight-appearing.   Msk:  Shoulder: Inspection reveals no abnormalities, atrophy or asymmetry. Palpation  with no tenderness over AC joint, does have some mild ttp anteriorly sup to bic groove and also lateral over SSP. ROM is full in all planes, except mildly decreased with IR (T10) Rotator cuff strength mildly weak with ER and empty can (4+/5) + impingement with Neer, but neg Hawkin's tests Speeds and Yergason's tests normal. Nl bic strength. No labral pathology noted with negative Obrien's, negative clunk and good stability. Normal scapular function observed. Mildly painful arc, but no drop arm sign. 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  However never rehabed her shoulder. - SA CSI today, see procedure note - take a few days off, then begin rehab program.  I gave her handout and theraband - concentrate on IR, ER, F flexion, rows, and reverse rows. - f/u 4-6 weeks for re eval  Consent obtained and verified. Sterile betadine prep. Furthur cleansed with alcohol. Topical analgesic spray: Ethyl chloride. Joint: Rt SA Approached in typical fashion with:posterior approach Completed without difficulty Meds: 1 cc 40 mg kenalog and 4 cc 1% lidocaine Needle: 23 G 1.5 inch Aftercare instructions and Red flags advised.  Orders: Joint Aspirate / Injection, Large (20610) Kenalog 10 mg inj (J3301) Theraband per yard (A9300)  Complete Medication List: 1)  Ventolin Hfa 108 (90 Base) Mcg/act Aers (Albuterol sulfate) .... 2 puffs inhaled every 4-6 hours as needed 2)  Fish Oil 1360 Mg Caps (Omega-3 fatty acids) .... Take 1 tablet by mouth once a day 3)  Aspirin 81 Mg Tabs (Aspirin) .... Take one tablet by mouth daily 4)  Cod Liver Oil 1000 Mg Caps (Cod liver oil) 5)  Vitamin B-6 250 Mg Tabs (Pyridoxine hcl) .... Take two tables by mouth daily 6)  Hydrocortisone 2.5 %  Oint (Hydrocortisone) .... Apply thin layer over area of itching twice a day 7)  Synthroid 75 Mcg Tabs (Levothyroxine sodium) .... Take 1 tablet by mouth once a day 8)  Lisinopril 5 Mg Tabs (Lisinopril) .... Take 1 tablet by mouth once a day 9)  Omeprazole 40 Mg Cpdr (Omeprazole) .... Take 1 tablet by mouth two times a day 10)  Pravachol 40 Mg Tabs (Pravastatin sodium) .... Take 1 tablet by mouth once a day 11)  Sertraline Hcl 25 Mg Tabs (Sertraline hcl) .... Take 1 tab by mouth every night 12)  Advair Diskus 250-50 Mcg/dose Aepb (Fluticasone-salmeterol) .... Take 1 puff two times a day   Orders  Added: 1)  Est. Patient Level III [11941] 2)  Joint Aspirate / Injection, Large [20610] 3)  Kenalog 10 mg inj [J3301] 4)  Theraband per yard [D4081]

## 2010-04-11 NOTE — Progress Notes (Signed)
Summary: refill/gg  Phone Note Refill Request  on November 01, 2009 1:48 PM  Refills Requested: Medication #1:  SYNTHROID 75 MCG TABS Take 1 tablet by mouth once a day   Last Refilled: 09/28/2009  Method Requested: Electronic Initial call taken by: Gevena Cotton RN,  November 01, 2009 1:48 PM    Prescriptions: SYNTHROID 75 MCG TABS (LEVOTHYROXINE SODIUM) Take 1 tablet by mouth once a day  #30 x 3   Entered and Authorized by:   Rudie Meyer MD   Signed by:   Rudie Meyer MD on 11/01/2009   Method used:   Electronically to        [facility] [phone]* (retail)       [address]       [place], [place]  [address]       Ph: [phone]       Fax: [phone]   RxID:   [id number]

## 2010-04-11 NOTE — Assessment & Plan Note (Signed)
Summary: 37MONTH F/U/VEGA/VS   Vital Signs:  Patient profile:   50 year old female Height:      63 inches (160.02 cm) Weight:      256.2 pounds (116.45 kg) BMI:     45.55 Temp:     97.6 degrees F (36.44 degrees C) Pulse rate:   97 / minute BP sitting:   124 / 85  (left arm) Cuff size:   regular  Vitals Entered By: Yvonna Alanis RN (August 24, 2008 3:31 PM) CC: follow-up visit- needs refills on meds, Depression Is Patient Diabetic? No Pain Assessment Patient in pain? yes     Location: body pain Intensity: none now - occurs when starts to settle down at night Type: aching Onset of pain  family dx fibromyalgia- chronic- esp right hip, right shoulder Nutritional Status BMI of > 30 = obese  Have you ever been in a relationship where you felt threatened, hurt or afraid?No   Does patient need assistance? Functional Status Self care Ambulation Normal Comments Tylenol and Advil do not help with pain described above   Primary Care Provider:  Kathlen Brunswick MD  CC:  follow-up visit- needs refills on meds and Depression.  History of Present Illness: 50 years old female with Past Medical History: Carpal Tunnel syndrome (right hand surgery 1998, left hand surgery 1999), Allergic rhinitis, Asthma, Depression, Hyperlipidemia, Hypothyroidism and Psoriasis  presents for follow up.  She reports generalised body pain time to time that is easily relieved with over the counter medications. She especially hurts at right shoulder and tail bone area. Pain is chronic and not precipitated by any recent trauma. She often gets pain if she were to just lay in bed. She gets betters with movement and activity.   She has not used narcotics before. She has no fevers, chills, urinary or bowel symptoms. She is still getting occassional Mosley colored discharge but has not been able to see gynecologist. She has deferred colonoscopy in past until she obtains orange card.   Depression History:      The  patient denies a depressed mood most of the day and a diminished interest in her usual daily activities.        Comments:  has family member who stresses her.   Preventive Screening-Counseling & Management  Alcohol-Tobacco     Smoking Status: never     Smoking Cessation Counseling: yes     Packs/Day: <1.0  Comments: wants to try otc patch  Current Medications (verified): 1)  Ventolin Hfa 108 (90 Base) Mcg/act Aers (Albuterol Sulfate) .... 2 Puffs Inhaled Every 4-6 Hours As Needed 2)  Fish Oil 1360 Mg Caps (Omega-3 Fatty Acids) .... Take 1 Tablet By Mouth Once A Day 3)  Aspirin 81 Mg Tabs (Aspirin) .... Take One Tablet By Mouth Daily 4)  Cod Liver Oil 1000 Mg Caps (Cod Liver Oil) 5)  Chlor-Trimeton Allergy 12 Mg Cr-Tabs (Chlorpheniramine Maleate) .... Take Two Tablets By Mouth Dailly 6)  Vitamin B-6 250 Mg Tabs (Pyridoxine Hcl) .... Take Two Tables By Mouth Daily 7)  Hydrocortisone 2.5 % Oint (Hydrocortisone) .... Apply Thin Layer Over Area of Itching Twice A Day 8)  Synthroid 50 Mcg Tabs (Levothyroxine Sodium) .... Take 1 Tablet By Mouth Once A Day 9)  Lisinopril 5 Mg Tabs (Lisinopril) .... Take 1 Tablet By Mouth Once A Day 10)  Amitriptyline Hcl 25 Mg Tabs (Amitriptyline Hcl) .... Take 1 Tablet By Mouth At Bedtime For One Week Then Take 2 Tablets By Mouth  At Bedtime 11)  Omeprazole 20 Mg Tbec (Omeprazole) .... Take 1 Tablet By Mouth Once A Day  Allergies (verified): 1)  ! Vicodin  Past History:  Past Medical History: Last updated: 04/27/2008 Carpal Tunnel syndrome (right hand surgery May 15, 1996, left hand surgery May 15, 1997) Allergic rhinitis Asthma Depression GERD Hyperlipidemia Hypothyroidism Psoriasis   Family History: Last updated: 04/27/2008 Family History Diabetes 1st degree relative Family History Hypertension Family History of Father died of Suicide (gunshot)  in 1996-05-15  Social History: Last updated: 08/24/2008 Married Current Smoker- one pack per day Alcohol use-no  Drug use-no  Risk Factors: Smoking Status: never (08/24/2008) Packs/Day: <1.0 (08/24/2008)  Social History: Reviewed history from 04/27/2008 and no changes required. Married Current Smoker- one pack per day Alcohol use-no Drug use-no Smoking Status:  never Packs/Day:  <1.0  Review of Systems      See HPI  Physical Exam  General:  overweight-appearing.   Eyes:  vision grossly intact.   Ears:  no external deformities.   Nose:  no external deformity.   Mouth:  pharynx pink and moist, no erythema, and no exudates.   Neck:  supple and full ROM.   Lungs:  Normal respiratory effort, chest expands symmetrically. Lungs are clear to auscultation, no crackles or wheezes. Heart:  Normal rate and regular rhythm. S1 and S2 normal without gallop, murmur, click, rub or other extra sounds. Abdomen:  soft, non-tender, and normal bowel sounds.   Msk:  normal ROM.   Extremities:  No c/c/e Neurologic:  Grossly non-focal Psych:  Euthymic.   Impression & Recommendations:  Problem # 1:  PAIN IN JOINT, SHOULDER REGION (ICD-719.41) minimal pain. Could not be elicited by any movement or deep palpation. I have advised her to continue taking over the counter medication as needed. She does not want to start narcotics and I dont see any need to do so. She is high risk of dependence given her past history of depression. However, if her generalised body pain continue to worsen and somehow limit her ability to work, she can be given tramadol.   Her updated medication list for this problem includes:    Aspirin 81 Mg Tabs (Aspirin) .Marland Kitchen... Take one tablet by mouth daily  Problem # 2:  HYPERTENSION (ICD-401.9) Assessment: Improved WEll controled. No new therapy.  Her updated medication list for this problem includes:    Lisinopril 5 Mg Tabs (Lisinopril) .Marland Kitchen... Take 1 tablet by mouth once a day  BP today: 124/85 Prior BP: 128/87 (07/25/2008)  Labs Reviewed: K+: 4.0 (06/29/2008) Creat: : 0.74  (06/29/2008)   Chol: 231 (04/28/2008)   HDL: 32 (04/28/2008)   LDL: 148 (04/28/2008)   TG: 254 (04/28/2008)  Problem # 3:  HEMATOCHEZIA (ICD-578.1) Resolved.   Problem # 4:  HYPOTHYROIDISM (ICD-244.9) Continue levothyroxine. No new symptoms.  Her updated medication list for this problem includes:    Synthroid 50 Mcg Tabs (Levothyroxine sodium) .Marland Kitchen... Take 1 tablet by mouth once a day  Labs Reviewed: TSH: 3.105 (06/20/2008)    HgBA1c: 6.1 (06/27/2008) Chol: 231 (04/28/2008)   HDL: 32 (04/28/2008)   LDL: 148 (04/28/2008)   TG: 254 (04/28/2008)  Problem # 5:  HYPERLIPIDEMIA (ICD-272.4) Patient in past used to be on lipitor. She has no trouble with medications. I will start her on pravachol and monitor her progress.  Labs Reviewed: SGOT: 22 (04/28/2008)   SGPT: 29 (04/28/2008)   HDL:32 (04/28/2008)  LDL:148 (04/28/2008)  Chol:231 (04/28/2008)  Trig:254 (04/28/2008)  Her updated medication list for this problem includes:  Pravastatin Sodium 20 Mg Tabs (Pravastatin sodium) .Marland Kitchen... Take 1 tablet by mouth once a day  Problem # 6:  Preventive Health Care (ICD-V70.0) Had PAP smear last time that was not satisfactory. She wants to go to Gynecologist and at this time will not like to repeat pelvic exam or smear as her symptoms are very minimal   Complete Medication List: 1)  Ventolin Hfa 108 (90 Base) Mcg/act Aers (Albuterol sulfate) .... 2 puffs inhaled every 4-6 hours as needed 2)  Fish Oil 1360 Mg Caps (Omega-3 fatty acids) .... Take 1 tablet by mouth once a day 3)  Aspirin 81 Mg Tabs (Aspirin) .... Take one tablet by mouth daily 4)  Cod Liver Oil 1000 Mg Caps (Cod liver oil) 5)  Chlor-trimeton Allergy 12 Mg Cr-tabs (Chlorpheniramine maleate) .... Take two tablets by mouth dailly 6)  Vitamin B-6 250 Mg Tabs (Pyridoxine hcl) .... Take two tables by mouth daily 7)  Hydrocortisone 2.5 % Oint (Hydrocortisone) .... Apply thin layer over area of itching twice a day 8)  Synthroid 50 Mcg Tabs  (Levothyroxine sodium) .... Take 1 tablet by mouth once a day 9)  Lisinopril 5 Mg Tabs (Lisinopril) .... Take 1 tablet by mouth once a day 10)  Amitriptyline Hcl 25 Mg Tabs (Amitriptyline hcl) .... Take 1 tablet by mouth at bedtime for one week then take 2 tablets by mouth at bedtime 11)  Omeprazole 20 Mg Tbec (Omeprazole) .... Take 1 tablet by mouth once a day 12)  Pravastatin Sodium 20 Mg Tabs (Pravastatin sodium) .... Take 1 tablet by mouth once a day  Patient Instructions: 1)  Please schedule a follow-up appointment in 3 months. Prescriptions: OMEPRAZOLE 20 MG TBEC (OMEPRAZOLE) Take 1 tablet by mouth once a day  #30 x 3   Entered and Authorized by:   Pershing Cox MD   Signed by:   Pershing Cox MD on 08/24/2008   Method used:   Electronically to        [facility] [phone]* (retail)       [address]       [place], [place]  [address]       Ph: [phone]       Fax: [phone]   RxID:   [id number] AMITRIPTYLINE HCL 25 MG TABS (AMITRIPTYLINE HCL) Take 1 tablet by mouth at bedtime for one week then Take 2 tablets by mouth at bedtime  #60 x 3   Entered and Authorized by:   Pershing Cox MD   Signed by:   Pershing Cox MD on 08/24/2008   Method used:   Electronically to        [facility] [phone]* (retail)       [place], [place]  [address]       Ph: [phone]       Fax: [phone]   RxID:   [id number] LISINOPRIL 5 MG TABS (LISINOPRIL) Take 1 tablet by mouth once a day  #30 x 3   Entered and Authorized by:   Pershing Cox MD   Signed by:   Pershing Cox MD on 08/24/2008   Method used:   Electronically to        [facility] [phone]* (retail)       [address]       [place], [place]  [address]       Ph: [phone]       Fax: [phone]   RxID:  [id number] SYNTHROID 50 MCG TABS (LEVOTHYROXINE SODIUM) Take 1 tablet by mouth once a day  #30 x 3   Entered and Authorized by:   Pershing Cox MD   Signed by:   Pershing Cox MD on 08/24/2008   Method used:   Electronically to        [facility] [phone]* (retail)       [place], [place]  [address]       Ph: [phone]       Fax: [phone]   RxID:   [id number] PRAVASTATIN SODIUM 20 MG TABS (PRAVASTATIN SODIUM) Take 1 tablet by mouth once a day  #30 x 3   Entered and Authorized by:   Pershing Cox MD   Signed by:   Pershing Cox MD on 08/24/2008   Method used:   Electronically to        [facility] [phone]* (retail)       [address]       [place], [place]  [address]       Ph: [phone]       Fax: [phone]   RxID:   [id number]

## 2010-04-11 NOTE — Progress Notes (Signed)
Summary: med refill/gp  Phone Note Refill Request Message from:  Fax from Pharmacy on June 11, 2009 3:47 PM  Refills Requested: Medication #1:  PRAVACHOL 40 MG TABS Take 1 tablet by mouth once a day   Last Refilled: 04/30/2009  Method Requested: Electronic Initial call taken by: Morrison Old RN,  June 11, 2009 3:47 PM    Prescriptions: PRAVACHOL 40 MG TABS (PRAVASTATIN SODIUM) Take 1 tablet by mouth once a day  #30 x 6   Entered and Authorized by:   Rudie Meyer MD   Signed by:   Rudie Meyer MD on 06/12/2009   Method used:   Electronically to        [facility] [phone]* (retail)       [address]       [place], [place]  [address]       Ph: [phone]       Fax: [phone]   RxID:   [id number]

## 2010-04-11 NOTE — Assessment & Plan Note (Signed)
Summary: FU VISIT/DS/VEGA   Vital Signs:  Patient profile:   50 year old female Height:      63 inches Weight:      258.1 pounds BMI:     45.89 O2 Sat:      96 % on Room air Temp:     97.2 degrees F Pulse rate:   90 / minute BP sitting:   125 / 84  (right arm) BP standing:   114 / 82  (right arm) Cuff size:   large  Vitals Entered By: Yvonna Alanis RN (November 01, 2008 4:10 PM)  O2 Flow:  Room air CC: continued asthma - has nebulizer at home but no meds - (nebulizer was not ordered for pt - she got it from someone else) - states cough continues - occasional green "tint at times"- did note cough got better while on meds but "not enough b/c it came back" Is Patient Diabetic? No Pain Assessment Patient in pain? yes     Location: right buttocks/hip Intensity: 0 Type: "hurts" Onset of pain  on and off since 2000 when fell on keys on hip taking care of mother - hurts when sitting or lying down - not while ambulatory Nutritional Status BMI of > 30 = obese  Have you ever been in a relationship where you felt threatened, hurt or afraid?No   Does patient need assistance? Functional Status Self care Ambulation Normal Comments pulse 97 when standing - generic Vicodiin helps hip - got one from her cousin recently   Primary Care Provider:  Kathlen Brunswick MD  CC:  continued asthma - has nebulizer at home but no meds - (nebulizer was not ordered for pt - she got it from someone else) - states cough continues - occasional green "tint at times"- did note cough got better while on meds but "not enough b/c it came back".  History of Present Illness: 50 yo F, with pmh of seasonal asthma (spring, fall) for 10 years presented last week to the outpatient clinic c/o productive cough since last friday, sputum is thich green-yellow in the morning, throughout the day it becomes clear white.  Pt denies fever, chills, denies any blood in sputum. pt was given Prednisone taper and deoxcycline. pt  today still have cough, worse when lay down. sputum still green. pt denies chest pain. fever, chills.  Patient is otherwise well, and only complains of mild R.hip pain that has been there 2000 after an injury, and hot flashes 2-3/day for the last 5 years.   Preventive Screening-Counseling & Management  Alcohol-Tobacco     Smoking Status: current     Packs/Day: 0.5  Current Medications (verified): 1)  Ventolin Hfa 108 (90 Base) Mcg/act Aers (Albuterol Sulfate) .... 2 Puffs Inhaled Every 4-6 Hours As Needed 2)  Fish Oil 1360 Mg Caps (Omega-3 Fatty Acids) .... Take 1 Tablet By Mouth Once A Day 3)  Aspirin 81 Mg Tabs (Aspirin) .... Take One Tablet By Mouth Daily 4)  Cod Liver Oil 1000 Mg Caps (Cod Liver Oil) 5)  Chlor-Trimeton Allergy 12 Mg Cr-Tabs (Chlorpheniramine Maleate) .... Take Two Tablets By Mouth Dailly 6)  Vitamin B-6 250 Mg Tabs (Pyridoxine Hcl) .... Take Two Tables By Mouth Daily 7)  Hydrocortisone 2.5 % Oint (Hydrocortisone) .... Apply Thin Layer Over Area of Itching Twice A Day 8)  Synthroid 50 Mcg Tabs (Levothyroxine Sodium) .... Take 1 Tablet By Mouth Once A Day 9)  Lisinopril 5 Mg Tabs (Lisinopril) .... Take 1 Tablet By  Mouth Once A Day 10)  Amitriptyline Hcl 25 Mg Tabs (Amitriptyline Hcl) .... Take 1 Tablet By Mouth At Bedtime For One Week Then Take 2 Tablets By Mouth At Bedtime 11)  Omeprazole 20 Mg Tbec (Omeprazole) .... Take 1 Tablet By Mouth Once A Day 12)  Pravastatin Sodium 20 Mg Tabs (Pravastatin Sodium) .... Take 1 Tablet By Mouth Once A Day 13)  Doxycycline Hyclate 100 Mg Caps (Doxycycline Hyclate) .... Two Times A Day 14)  Prednisone 10 Mg Tabs (Prednisone) .... Take 4 Tablets For 2 Days, Then 3 Tablets For 2 Days,then 2 Tablets For 2 Days,then 1 Tablets For 2 Days.  Allergies (verified): 1)  ! Vicodin  Social History: Smoking Status:  current Packs/Day:  0.5  Review of Systems General:  Denies chills, fatigue, and fever. ENT:  Complains of nasal  congestion. CV:  Denies fainting, fatigue, swelling of feet, and swelling of hands. Resp:  Complains of cough; denies chest discomfort, chest pain with inspiration, and sputum productive. GI:  Denies abdominal pain, bloody stools, constipation, diarrhea, nausea, and vomiting. GU:  Denies discharge and dysuria. MS:  Denies joint pain, joint redness, and joint swelling. Derm:  Denies dryness. Neuro:  Denies poor balance, seizures, and sensation of room spinning. Psych:  Denies anxiety and depression. Endo:  Denies cold intolerance and excessive urination. Heme:  Denies bleeding. Allergy:  Denies seasonal allergies.  Physical Exam  General:  alert, well-developed, and cooperative to examination.    Head:  normocephalic and atraumatic.    Eyes:  vision grossly intact, pupils equal, pupils round, pupils reactive to light, no injection and anicteric.    Mouth:   pharynx pink and moist, no erythema, and no exudates.    Neck:  supple, full ROM, no thyromegaly, no JVD, and no carotid bruits.    Lungs:  normal respiratory effort, no accessory muscle use, normal breath sounds, no crackles, + end exp. wheezes Heart:  normal rate, regular rhythm, no murmur, no gallop, and no rub.    Abdomen:  soft, non-tender, normal bowel sounds, no distention, no guarding, no rebound tenderness, no hepatomegaly, and no splenomegaly.    Msk:  no joint swelling, no joint warmth, and no redness over joints.    Pulses:  2+ DP/PT pulses bilaterally  Extremities:  No cyanosis, clubbing, edema  Neurologic:  alert & oriented X3, cranial nerves II-XII intact, strength normal in all extremities, sensation intact to light touch, and gait normal.     Skin:   turgor normal and no rashes.  Psych:  Oriented X3, memory intact for recent and remote, normally interactive, good eye contact, not anxious appearing, and not depressed appearing.     Impression & Recommendations:  Problem # 1:  ASTHMA (ICD-493.90) gave adavir  sample. reassess 2 weeks.   The following medications were removed from the medication list:    Prednisone 10 Mg Tabs (Prednisone) .Marland Kitchen... Take 4 tablets for 2 days, then 3 tablets for 2 days,then 2 tablets for 2 days,then 1 tablets for 2 days. Her updated medication list for this problem includes:    Ventolin Hfa 108 (90 Base) Mcg/act Aers (Albuterol sulfate) .Marland Kitchen... 2 puffs inhaled every 4-6 hours as needed  Problem # 2:  HIP PAIN, RIGHT (ICD-719.45) Pain present for one week, if pain persists on next visit consider plain x ray to evaluate for osteoarthritis.   Complete Medication List: 1)  Ventolin Hfa 108 (90 Base) Mcg/act Aers (Albuterol sulfate) .... 2 puffs inhaled every 4-6 hours as  needed 2)  Fish Oil 1360 Mg Caps (Omega-3 fatty acids) .... Take 1 tablet by mouth once a day 3)  Aspirin 81 Mg Tabs (Aspirin) .... Take one tablet by mouth daily 4)  Cod Liver Oil 1000 Mg Caps (Cod liver oil) 5)  Chlor-trimeton Allergy 12 Mg Cr-tabs (Chlorpheniramine maleate) .... Take two tablets by mouth dailly 6)  Vitamin B-6 250 Mg Tabs (Pyridoxine hcl) .... Take two tables by mouth daily 7)  Hydrocortisone 2.5 % Oint (Hydrocortisone) .... Apply thin layer over area of itching twice a day 8)  Synthroid 50 Mcg Tabs (Levothyroxine sodium) .... Take 1 tablet by mouth once a day 9)  Lisinopril 5 Mg Tabs (Lisinopril) .... Take 1 tablet by mouth once a day 10)  Amitriptyline Hcl 25 Mg Tabs (Amitriptyline hcl) .... Take 1 tablet by mouth at bedtime for one week then take 2 tablets by mouth at bedtime 11)  Omeprazole 20 Mg Tbec (Omeprazole) .... Take 1 tablet by mouth once a day 12)  Pravastatin Sodium 20 Mg Tabs (Pravastatin sodium) .... Take 1 tablet by mouth once a day 13)  Doxycycline Hyclate 100 Mg Caps (Doxycycline hyclate) .... Two times a day  Patient Instructions: 1)  Please schedule a follow-up appointment in 2 weeks. 2)  use Advair, twice per day only.   Prevention & Chronic Care Immunizations    Influenza vaccine: Not documented   Influenza vaccine deferral: Deferred  (11/01/2008)    Tetanus booster: Not documented   Td booster deferral: Deferred  (11/01/2008)    Pneumococcal vaccine: Not documented  Other Screening   Pap smear: UNSATISFACTORY FOR EVALUATION.  THE SPECIMEN IS PROCESSED  (07/25/2008)    Mammogram: Not documented   Mammogram action/deferral: Ordered  (10/18/2008)   Smoking status: current  (11/01/2008)   Smoking cessation counseling: yes  (10/18/2008)  Lipids   Total Cholesterol: 231  (04/28/2008)   LDL: 148  (04/28/2008)   LDL Direct: Not documented   HDL: 32  (04/28/2008)   Triglycerides: 254  (04/28/2008)    SGOT (AST): 22  (04/28/2008)   SGPT (ALT): 29  (04/28/2008)   Alkaline phosphatase: 83  (04/28/2008)   Total bilirubin: 0.3  (04/28/2008)    Lipid flowsheet reviewed?: Yes   Progress toward LDL goal: Unchanged  Hypertension   Last Blood Pressure: 125 / 84  (11/01/2008)   Serum creatinine: 0.74  (06/29/2008)   Serum potassium 4.0  (06/29/2008)    Hypertension flowsheet reviewed?: Yes   Progress toward BP goal: At goal  Self-Management Support :    Patient will work on the following items until the next clinic visit to reach self-care goals:     Medications and monitoring: check my blood pressure  (11/01/2008)     Eating: eat more vegetables, use fresh or frozen vegetables, eat foods that are low in salt  (11/01/2008)     Activity: take a 30 minute walk every day  (11/01/2008)    Hypertension self-management support: Not documented    Lipid self-management support: Not documented    Appended Document: FU VISIT/DS/VEGA

## 2010-04-11 NOTE — Assessment & Plan Note (Signed)
Summary: CHECKUP/SB.   Vital Signs:  Patient profile:   50 year old female Height:      63 inches Weight:      264.0 pounds BMI:     46.93 Temp:     97.5 degrees F oral Pulse rate:   90 / minute BP sitting:   120 / 70  (right arm)  Vitals Entered By: Silverio Decamp NT II (January 18, 2009 3:35 PM) CC: NEED REFILLS ON THYROID MEDICINE, OMEPRAZOLE, AMITRIPTYLINE, Is Patient Diabetic? No Pain Assessment Patient in pain? no      Nutritional Status BMI of > 30 = obese  Have you ever been in a relationship where you felt threatened, hurt or afraid?No   Does patient need assistance? Functional Status Self care Ambulation Normal   Primary Care Provider:  Kathlen Brunswick MD  CC:  NEED REFILLS ON THYROID MEDICINE, OMEPRAZOLE, AMITRIPTYLINE, and .  History of Present Illness: 50 yr old woman with pmhx comes to the clinic for follow up. Patient no complains. Denies chest pain, SOB, abdominal pain, palpitations.   Patient has not been taking synthroid for 2 weeks because she ran out of medication.  Depression History:      The patient denies a depressed mood most of the day and a diminished interest in her usual daily activities.         Preventive Screening-Counseling & Management  Alcohol-Tobacco     Alcohol type: occasionally     Smoking Status: current     Smoking Cessation Counseling: yes     Packs/Day: 0.5     Year Started: smoking 33 yrs  Caffeine-Diet-Exercise     Does Patient Exercise: yes     Type of exercise: YARD WORK  Problems Prior to Update: 1)  Hip Pain, Right  (ICD-719.45) 2)  Pain in Joint, Shoulder Region  (ICD-719.41) 3)  Postmenopausal Bleeding  (ICD-627.1) 4)  Microscopic Hematuria  (ICD-599.72) 5)  Hematochezia  (ICD-578.1) 6)  Other Nonspecific Finding Examination of Urine  (ICD-791.9) 7)  Tobacco Abuse  (ICD-305.1) 8)  Hypertension  (ICD-401.9) 9)  Skin Rash  (ICD-782.1) 10)  Family History Diabetes 1st Degree Relative  (ICD-V18.0) 11)   Hypothyroidism  (ICD-244.9) 12)  Hyperlipidemia  (ICD-272.4) 13)  Gerd  (ICD-530.81) 14)  Depression  (ICD-311) 15)  Asthma  (ICD-493.90) 16)  Allergic Rhinitis  (ICD-477.9)  Medications Prior to Update: 1)  Ventolin Hfa 108 (90 Base) Mcg/act Aers (Albuterol Sulfate) .... 2 Puffs Inhaled Every 4-6 Hours As Needed 2)  Fish Oil 1360 Mg Caps (Omega-3 Fatty Acids) .... Take 1 Tablet By Mouth Once A Day 3)  Aspirin 81 Mg Tabs (Aspirin) .... Take One Tablet By Mouth Daily 4)  Cod Liver Oil 1000 Mg Caps (Cod Liver Oil) 5)  Chlor-Trimeton Allergy 12 Mg Cr-Tabs (Chlorpheniramine Maleate) .... Take Two Tablets By Mouth Dailly 6)  Vitamin B-6 250 Mg Tabs (Pyridoxine Hcl) .... Take Two Tables By Mouth Daily 7)  Hydrocortisone 2.5 % Oint (Hydrocortisone) .... Apply Thin Layer Over Area of Itching Twice A Day 8)  Synthroid 50 Mcg Tabs (Levothyroxine Sodium) .... Take 1 Tablet By Mouth Once A Day 9)  Lisinopril 5 Mg Tabs (Lisinopril) .... Take 1 Tablet By Mouth Once A Day 10)  Amitriptyline Hcl 25 Mg Tabs (Amitriptyline Hcl) .... Take 1 Tablet By Mouth At Bedtime For One Week Then Take 2 Tablets By Mouth At Bedtime 11)  Omeprazole 20 Mg Tbec (Omeprazole) .... Take 1 Tablet By Mouth Once A Day  12)  Pravastatin Sodium 20 Mg Tabs (Pravastatin Sodium) .... Take 2 Tablets By Mouth Once A Day 13)  Singulair 10 Mg Tabs (Montelukast Sodium) .... Take 1 Tablet By Mouth Once A Day 14)  Voltaren 1 % Gel (Diclofenac Sodium) .... Apply 4g of Gel To Affected Area 4 Times A Daily  Current Medications (verified): 1)  Ventolin Hfa 108 (90 Base) Mcg/act Aers (Albuterol Sulfate) .... 2 Puffs Inhaled Every 4-6 Hours As Needed 2)  Fish Oil 1360 Mg Caps (Omega-3 Fatty Acids) .... Take 1 Tablet By Mouth Once A Day 3)  Aspirin 81 Mg Tabs (Aspirin) .... Take One Tablet By Mouth Daily 4)  Cod Liver Oil 1000 Mg Caps (Cod Liver Oil) 5)  Chlor-Trimeton Allergy 12 Mg Cr-Tabs (Chlorpheniramine Maleate) .... Take Two Tablets By Mouth  Dailly 6)  Vitamin B-6 250 Mg Tabs (Pyridoxine Hcl) .... Take Two Tables By Mouth Daily 7)  Hydrocortisone 2.5 % Oint (Hydrocortisone) .... Apply Thin Layer Over Area of Itching Twice A Day 8)  Synthroid 50 Mcg Tabs (Levothyroxine Sodium) .... Take 1 Tablet By Mouth Once A Day 9)  Lisinopril 5 Mg Tabs (Lisinopril) .... Take 1 Tablet By Mouth Once A Day 10)  Amitriptyline Hcl 25 Mg Tabs (Amitriptyline Hcl) .... Take 1 Tablet By Mouth At Bedtime For One Week Then Take 2 Tablets By Mouth At Bedtime 11)  Omeprazole 20 Mg Tbec (Omeprazole) .... Take 1 Tablet By Mouth Once A Day 12)  Pravastatin Sodium 20 Mg Tabs (Pravastatin Sodium) .... Take 2 Tablets By Mouth Once A Day 13)  Singulair 10 Mg Tabs (Montelukast Sodium) .... Take 1 Tablet By Mouth Once A Day 14)  Voltaren 1 % Gel (Diclofenac Sodium) .... Apply 4g of Gel To Affected Area 4 Times A Daily  Allergies: 1)  ! Vicodin  Past History:  Past Medical History: Last updated: 04/27/2008 Carpal Tunnel syndrome (right hand surgery 1996-05-25, left hand surgery 05-25-1997) Allergic rhinitis Asthma Depression GERD Hyperlipidemia Hypothyroidism Psoriasis   Family History: Last updated: 04/27/2008 Family History Diabetes 1st degree relative Family History Hypertension Family History of Father died of Suicide (gunshot)  in 05/25/1996  Social History: Last updated: 08/24/2008 Married Current Smoker- one pack per day Alcohol use-no Drug use-no  Risk Factors: Exercise: yes (01/18/2009)  Risk Factors: Smoking Status: current (01/18/2009) Packs/Day: 0.5 (01/18/2009)  Family History: Reviewed history from 04/27/2008 and no changes required. Family History Diabetes 1st degree relative Family History Hypertension Family History of Father died of Suicide (gunshot)  in 05-25-96  Social History: Reviewed history from 08/24/2008 and no changes required. Married Current Smoker- one pack per day Alcohol use-no Drug use-no  Review of Systems       The  patient complains of difficulty walking.  The patient denies fever, chest pain, dyspnea on exertion, peripheral edema, headaches, hemoptysis, abdominal pain, melena, hematochezia, hematuria, and muscle weakness.    Physical Exam  General:  NAD Head:  normocephalic and atraumatic.    Eyes:  vision grossly intact.   Ears:  no external deformities.   Nose:  no external deformity.   Mouth:   pharynx pink and moist, no erythema, and no exudates.    Neck:  supple, full ROM, no thyromegaly, no JVD, and no carotid bruits.    Lungs:  normal respiratory effort, no accessory muscle use, normal breath sounds, no crackles, no wheezes Heart:  normal rate, regular rhythm, no murmur, no gallop, and no rub.    Abdomen:  soft, non-tender, normal bowel  sounds, no distention, no guarding, no rebound tenderness, no hepatomegaly, and no splenomegaly.    Extremities:  No cyanosis, clubbing, edema  Neurologic:  Nonfocal Psych:  normally interactive.     Impression & Recommendations:  Problem # 1:  HYPOTHYROIDISM (ICD-244.9) Patient will be restarted on medication. Recheck TSH in 6 weeks.  Her updated medication list for this problem includes:    Synthroid 50 Mcg Tabs (Levothyroxine sodium) .Marland Kitchen... Take 1 tablet by mouth once a day  Problem # 2:  HYPERTENSION (ICD-401.9) At goal. No change.  Her updated medication list for this problem includes:    Lisinopril 5 Mg Tabs (Lisinopril) .Marland Kitchen... Take 1 tablet by mouth once a day  BP today: 120/70 Prior BP: 128/93 (11/28/2008)  Labs Reviewed: K+: 4.8 (01/17/2009) Creat: : 0.68 (01/17/2009)   Chol: 214 (01/17/2009)   HDL: 34 (01/17/2009)   LDL: 148 (01/17/2009)   TG: 162 (01/17/2009)  Problem # 3:  HYPERLIPIDEMIA (ICD-272.4) Increase pravastatin to 44m by mouth daily. Recheck FLP next clinic visit.  Her updated medication list for this problem includes:    Pravachol 40 Mg Tabs (Pravastatin sodium) ..Marland Kitchen.. Take 1 tablet by mouth once a day  Labs Reviewed:  SGOT: 24 (01/17/2009)   SGPT: 30 (01/17/2009)   HDL:34 (01/17/2009), 32 (04/28/2008)  LDL:148 (01/17/2009), 148 (04/28/2008)  Chol:214 (01/17/2009), 231 (04/28/2008)  Trig:162 (01/17/2009), 254 (04/28/2008)  Problem # 4:  TOBACCO ABUSE (ICD-305.1) Encouraged smoking cessation and discussed different methods for smoking cessation.   Problem # 5:  ASTHMA (ICD-493.90) Stable. Patient did not pick up refill to singulair because it was too expensive. Information about MAP program was given. Will follow up.  Her updated medication list for this problem includes:    Ventolin Hfa 108 (90 Base) Mcg/act Aers (Albuterol sulfate) ..Marland Kitchen.. 2 puffs inhaled every 4-6 hours as needed    Singulair 10 Mg Tabs (Montelukast sodium) ..Marland Kitchen.. Take 1 tablet by mouth once a day  Problem # 6:  DEPRESSION (ICD-311) Stable. No SSI/HI.   Her updated medication list for this problem includes:    Amitriptyline Hcl 25 Mg Tabs (Amitriptyline hcl) ..Marland Kitchen.. Take 1 tablet by mouth at bedtime for one week then take 2 tablets by mouth at bedtime  Complete Medication List: 1)  Ventolin Hfa 108 (90 Base) Mcg/act Aers (Albuterol sulfate) .... 2 puffs inhaled every 4-6 hours as needed 2)  Fish Oil 1360 Mg Caps (Omega-3 fatty acids) .... Take 1 tablet by mouth once a day 3)  Aspirin 81 Mg Tabs (Aspirin) .... Take one tablet by mouth daily 4)  Cod Liver Oil 1000 Mg Caps (Cod liver oil) 5)  Chlor-trimeton Allergy 12 Mg Cr-tabs (Chlorpheniramine maleate) .... Take two tablets by mouth dailly 6)  Vitamin B-6 250 Mg Tabs (Pyridoxine hcl) .... Take two tables by mouth daily 7)  Hydrocortisone 2.5 % Oint (Hydrocortisone) .... Apply thin layer over area of itching twice a day 8)  Synthroid 50 Mcg Tabs (Levothyroxine sodium) .... Take 1 tablet by mouth once a day 9)  Lisinopril 5 Mg Tabs (Lisinopril) .... Take 1 tablet by mouth once a day 10)  Amitriptyline Hcl 25 Mg Tabs (Amitriptyline hcl) .... Take 1 tablet by mouth at bedtime for one week  then take 2 tablets by mouth at bedtime 11)  Omeprazole 20 Mg Tbec (Omeprazole) .... Take 1 tablet by mouth once a day 12)  Pravachol 40 Mg Tabs (Pravastatin sodium) .... Take 1 tablet by mouth once a day 13)  Singulair 10 Mg  Tabs (Montelukast sodium) .... Take 1 tablet by mouth once a day 14)  Voltaren 1 % Gel (Diclofenac sodium) .... Apply 4g of gel to affected area 4 times a daily  Patient Instructions: 1)  Please schedule a follow-up appointment in 2 months. 2)  Take all medication as directed. 3)  Tobacco is very bad for your health and your loved ones! You Should stop smoking!. 4)  Stop Smoking Tips: Choose a Quit date. Cut down before the Quit date. decide what you will do as a substitute when you feel the urge to smoke(gum,toothpick,exercise). 5)  Limit your Sodium (Salt). 6)  It is important that you exercise regularly at least 20 minutes 5 times a week. If you develop chest pain, have severe difficulty breathing, or feel very tired , stop exercising immediately and seek medical attention. Prescriptions: PRAVACHOL 40 MG TABS (PRAVASTATIN SODIUM) Take 1 tablet by mouth once a day  #30 x 3   Entered and Authorized by:   Rudie Meyer MD   Signed by:   Rudie Meyer MD on 01/21/2009   Method used:   Electronically to        [facility] [phone]* (retail)       [address]       [place], [place]  [address]       Ph: [phone]       Fax: [phone]   RxID:   [id number] SYNTHROID 50 MCG TABS (LEVOTHYROXINE SODIUM) Take 1 tablet by mouth once a day  #30 x 6   Entered and Authorized by:   Rudie Meyer MD   Signed by:   Rudie Meyer MD on 01/21/2009   Method used:   Electronically to        [facility] [phone]* (retail)       [address]       [place], [place]  [address]       Ph: [phone]       Fax: [phone]   RxID:   [id number]   Prevention & Chronic Care Immunizations   Influenza vaccine: Fluvax 3+   (11/28/2008)   Influenza vaccine deferral: Deferred  (11/01/2008)    Tetanus booster: Not documented   Td booster deferral: Deferred  (11/01/2008)    Pneumococcal vaccine: Not documented  Other Screening   Pap smear: UNSATISFACTORY FOR EVALUATION.  THE SPECIMEN IS PROCESSED  (07/25/2008)    Mammogram: ASSESSMENT: Negative - BI-RADS 1^MS DIGITAL SCREENING  (12/13/2008)   Mammogram action/deferral: Ordered  (10/18/2008)   Smoking status: current  (01/18/2009)   Smoking cessation counseling: yes  (01/18/2009)  Lipids   Total Cholesterol: 214  (01/17/2009)   Lipid panel action/deferral: Lipid Panel ordered   LDL: 148  (01/17/2009)   LDL Direct: Not documented   HDL: 34  (01/17/2009)   Triglycerides: 162  (01/17/2009)    SGOT (AST): 24  (01/17/2009)   BMP action: Ordered   SGPT (ALT): 30  (01/17/2009)   Alkaline phosphatase: 107  (01/17/2009)   Total bilirubin: 0.3  (01/17/2009)  Hypertension   Last Blood Pressure: 120 / 70  (01/18/2009)   Serum creatinine: 0.68  (01/17/2009)   BMP action: Ordered   Serum potassium 4.8  (01/17/2009)  Self-Management Support :   Personal Goals (by the next clinic visit) :      Personal blood pressure goal: 130/80  (11/28/2008)     Personal LDL goal: 130  (11/28/2008)    Patient will work on the following items until the  next clinic visit to reach self-care goals:     Medications and monitoring: take my medicines every day, bring all of my medications to every visit  (01/18/2009)     Eating: drink diet soda or water instead of juice or soda, eat more vegetables, use fresh or frozen vegetables, eat foods that are low in salt, eat baked foods instead of fried foods, eat fruit for snacks and desserts  (01/18/2009)     Activity: take a 30 minute walk every day  (01/18/2009)     Other: yard work  (01/18/2009)    Hypertension self-management support: Written self-care plan  (11/28/2008)    Lipid self-management support: Written self-care plan   (11/28/2008)

## 2010-04-11 NOTE — Assessment & Plan Note (Signed)
Summary: URI f/u , see note/pcp-vega/hla   Vital Signs:  Patient profile:   50 year old female Height:      63 inches (160.02 cm) Weight:      273.9 pounds (124.50 kg) BMI:     48.69 Temp:     98.6 degrees F (37.00 degrees C) oral Pulse rate:   112 / minute BP sitting:   118 / 87  (left arm) Cuff size:   large  Vitals Entered By: Nadine Counts Deborra Medina) (May 29, 2009 2:23 PM) CC: pt was seen in ED yesterday for URI symptoms that started 3-4 days ago,  and rx'd doxy 123m bid x 10 days, pt doesnt think this will work and has requested rx for Pen VK 506mTID for 10 days, neck and body pain  Is Patient Diabetic? No Pain Assessment Patient in pain? yes     Location: throat Intensity: 3 Type: sore Onset of pain  sx started 3-4 days ago Nutritional Status BMI of > 30 = obese  Have you ever been in a relationship where you felt threatened, hurt or afraid?No   Does patient need assistance? Functional Status Self care, Cook/clean Ambulation Normal   Primary Care Provider:  RaKathlen BrunswickD  CC:  pt was seen in ED yesterday for URI symptoms that started 3-4 days ago, and rx'd doxy 10076mid x 10 days, pt doesnt think this will work and has requested rx for Pen VK 500m50mD for 10 days, and neck and body pain .  History of Present Illness: Kendra Adams 48 o66 woman current smoker with PMH as described in EMR is here today for a ED follow up yesterday.  She went ot ED yesterday as she was using her rescue inhalers >6 times a day and she was short of breath and wheezing. She was evaluated with CXR and a lateral view of neck, both of them were negative for any active disease or abnormality. She was given doxycycline on discharge which she thinks is not going to work. She has been smoking 1 PPD for last 30 years now and has been having difficulty in breathing with multiple episodes of URI recently. She has not been ever evaluated for COPD before and says that she does have  asthamatic bronchitis. She had laryngitis a month ago and was treated with penicillin by an ENT doctor.   No other complaints at this time.  Preventive Screening-Counseling & Management  Alcohol-Tobacco     Alcohol type: occasionally     Smoking Status: quit     Smoking Cessation Counseling: yes     Packs/Day: 0.5     Year Started: smoking 33 yrs  Problems Prior to Update: 1)  Hip Pain, Right  (ICD-719.45) 2)  Pain in Joint, Shoulder Region  (ICD-719.41) 3)  Postmenopausal Bleeding  (ICD-627.1) 4)  Microscopic Hematuria  (ICD-599.72) 5)  Hematochezia  (ICD-578.1) 6)  Other Nonspecific Finding Examination of Urine  (ICD-791.9) 7)  Tobacco Abuse  (ICD-305.1) 8)  Hypertension  (ICD-401.9) 9)  Skin Rash  (ICD-782.1) 10)  Family History Diabetes 1st Degree Relative  (ICD-V18.0) 11)  Hypothyroidism  (ICD-244.9) 12)  Hyperlipidemia  (ICD-272.4) 13)  Gerd  (ICD-530.81) 14)  Depression  (ICD-311) 15)  Asthma  (ICD-493.90) 16)  Allergic Rhinitis  (ICD-477.9)  Medications Prior to Update: 1)  Ventolin Hfa 108 (90 Base) Mcg/act Aers (Albuterol Sulfate) .... 2 Puffs Inhaled Every 4-6 Hours As Needed 2)  Fish Oil 1360 Mg Caps (Omega-3 Fatty  Acids) .... Take 1 Tablet By Mouth Once A Day 3)  Aspirin 81 Mg Tabs (Aspirin) .... Take One Tablet By Mouth Daily 4)  Cod Liver Oil 1000 Mg Caps (Cod Liver Oil) 5)  Chlor-Trimeton Allergy 12 Mg Cr-Tabs (Chlorpheniramine Maleate) .... Take Two Tablets By Mouth Dailly 6)  Vitamin B-6 250 Mg Tabs (Pyridoxine Hcl) .... Take Two Tables By Mouth Daily 7)  Hydrocortisone 2.5 % Oint (Hydrocortisone) .... Apply Thin Layer Over Area of Itching Twice A Day 8)  Synthroid 50 Mcg Tabs (Levothyroxine Sodium) .... Take 1 Tablet By Mouth Once A Day 9)  Lisinopril 5 Mg Tabs (Lisinopril) .... Take 1 Tablet By Mouth Once A Day 10)  Amitriptyline Hcl 25 Mg Tabs (Amitriptyline Hcl) .... Take 1 Tablet By Mouth At Bedtime For One Week Then Take 2 Tablets By Mouth At  Bedtime 11)  Omeprazole 20 Mg Tbec (Omeprazole) .... Take 1 Tablet By Mouth Once A Day 12)  Pravachol 40 Mg Tabs (Pravastatin Sodium) .... Take 1 Tablet By Mouth Once A Day 13)  Singulair 10 Mg Tabs (Montelukast Sodium) .... Take 1 Tablet By Mouth Once A Day 14)  Voltaren 1 % Gel (Diclofenac Sodium) .... Apply 4g of Gel To Affected Area 4 Times A Daily 15)  Prednisone 10 Mg Tabs (Prednisone) .... Take 4 Tablet For 2 Days Then 3 Tablets For 1 Day The 2 Tablets For 1 Day The 1 Tablet For 1 Day. 16)  Guiatuss Ac 100-10 Mg/73m Syrp (Guaifenesin-Codeine) .... Take 531mThree Times  A Day For Cough As Needed.  Current Medications (verified): 1)  Ventolin Hfa 108 (90 Base) Mcg/act Aers (Albuterol Sulfate) .... 2 Puffs Inhaled Every 4-6 Hours As Needed 2)  Fish Oil 1360 Mg Caps (Omega-3 Fatty Acids) .... Take 1 Tablet By Mouth Once A Day 3)  Aspirin 81 Mg Tabs (Aspirin) .... Take One Tablet By Mouth Daily 4)  Cod Liver Oil 1000 Mg Caps (Cod Liver Oil) 5)  Vitamin B-6 250 Mg Tabs (Pyridoxine Hcl) .... Take Two Tables By Mouth Daily 6)  Hydrocortisone 2.5 % Oint (Hydrocortisone) .... Apply Thin Layer Over Area of Itching Twice A Day 7)  Synthroid 50 Mcg Tabs (Levothyroxine Sodium) .... Take 1 Tablet By Mouth Once A Day 8)  Lisinopril 5 Mg Tabs (Lisinopril) .... Take 1 Tablet By Mouth Once A Day 9)  Amitriptyline Hcl 25 Mg Tabs (Amitriptyline Hcl) .... Take 1 Tablet By Mouth At Bedtime For One Week and Then Stop 10)  Omeprazole 20 Mg Tbec (Omeprazole) .... Take 1 Tablet By Mouth Once A Day 11)  Pravachol 40 Mg Tabs (Pravastatin Sodium) .... Take 1 Tablet By Mouth Once A Day 12)  Singulair 10 Mg Tabs (Montelukast Sodium) .... Take 1 Tablet By Mouth Once A Day 13)  Sertraline Hcl 25 Mg Tabs (Sertraline Hcl) .... Take 1 Tab By Mouth Every Night 14)  Advair Hfa 115-21 Mcg/act Aero (Fluticasone-Salmeterol) .... Inhale 2 Puffs 2 Times A Day 15)  Doxycycline Hyclate 100 Mg Tabs (Doxycycline Hyclate) .... Take 1  Tablet By Mouth Two Times A Day 16)  Prednisone 20 Mg Tabs (Prednisone) .... Take 3 Tabs For 3 Days, Then 2 Tabs For 2 Days, 1 Tab For Next 2 Days and 1/2 Tab For Next 2 Days and Then Stop.  Allergies: 1)  ! Vicodin  Past History:  Past Medical History: Last updated: 04/27/2008 Carpal Tunnel syndrome (right hand surgery 1998, left hand surgery 1999) Allergic rhinitis Asthma Depression GERD Hyperlipidemia Hypothyroidism  Psoriasis   Family History: Last updated: 04/27/2008 Family History Diabetes 1st degree relative Family History Hypertension Family History of Father died of Suicide (gunshot)  in 1996/06/01  Social History: Last updated: 08/24/2008 Married Current Smoker- one pack per day Alcohol use-no Drug use-no  Risk Factors: Exercise: yes (01/31/2009)  Risk Factors: Smoking Status: quit (05/29/2009) Packs/Day: 0.5 (05/29/2009)  Review of Systems      See HPI  Physical Exam  Additional Exam:  Gen: AOx3, in no acute distress Eyes: PERRL, EOMI ENT:MMM, No erythema noted in posterior pharynx Neck: No JVD, No LAP Chest: CTAB with few scattered wheeze and bronchial breath sounds,  good respiratory effort CVS: regular rhythmic rate, NO M/R/G, S1 S2 normal Abdo: soft,ND, BS+x4, Non tender and No hepatosplenomegaly EXT: No odema noted Neuro: Non focal, gait is normal Skin: no rashes noted.    Impression & Recommendations:  Problem # 1:  CHRONIC OBSTRUCTIVE PULMONARY DISEASE, ACUTE EXACERBATION (ICD-491.21) Assessment Deteriorated Since patient has been a smoker for last 33 years and also since PE and CXR  is consistent with bronchial breathing, I will starte the patient on advair, give her prednisone taper, continue Doxy as prescribed and obtain PFT's for better assessment of her lung function. Will review in a month. Orders: PFT Baseline-Pre/Post Bronchodiolator (PFT Baseline-Pre/Pos)  Problem # 2:  TOBACCO ABUSE (ICD-305.1) Assessment: Unchanged Discussed in  detail about the the lung function being deteriorated by continued smoking and also she being at high risk of heart attacks and strokes if she continues to smoke.  Problem # 3:  HYPERTENSION (ICD-401.9) Assessment: Comment Only Well controlled at this time. Her updated medication list for this problem includes:    Lisinopril 5 Mg Tabs (Lisinopril) .Marland Kitchen... Take 1 tablet by mouth once a day  BP today: 118/87 Prior BP: 120/97 (01/31/2009)  Labs Reviewed: K+: 4.8 (01/17/2009) Creat: : 0.68 (01/17/2009)   Chol: 214 (01/17/2009)   HDL: 34 (01/17/2009)   LDL: 148 (01/17/2009)   TG: 162 (01/17/2009)  Problem # 4:  HYPERLIPIDEMIA (ICD-272.4) Assessment: Comment Only Patient is compliant with her cholesterol meds. Will check in another month when she comes for a follow up of her PFT results. Her updated medication list for this problem includes:    Pravachol 40 Mg Tabs (Pravastatin sodium) .Marland Kitchen... Take 1 tablet by mouth once a day  Labs Reviewed: SGOT: 24 (01/17/2009)   SGPT: 30 (01/17/2009)   HDL:34 (01/17/2009), 32 (04/28/2008)  LDL:148 (01/17/2009), 148 (04/28/2008)  Chol:214 (01/17/2009), 231 (04/28/2008)  Trig:162 (01/17/2009), 254 (04/28/2008)  Problem # 5:  DEPRESSION (ICD-311) Assessment: Deteriorated Discontinued amitryptaline but will taper it for 1 week. Started her on Sertraline as she was on it before and it worked better. No SI/HI. Her updated medication list for this problem includes:    Amitriptyline Hcl 25 Mg Tabs (Amitriptyline hcl) .Marland Kitchen... Take 1 tablet by mouth at bedtime for one week and then stop    Sertraline Hcl 25 Mg Tabs (Sertraline hcl) .Marland Kitchen... Take 1 tab by mouth every night  Discussed treatment options, including trial of antidpressant medication. Will refer to behavioral health. Follow-up call in in 24-48 hours and recheck in 2 weeks, sooner as needed. Patient agrees to call if any worsening of symptoms or thoughts of doing harm arise. Verified that the patient has no  suicidal ideation at this time.   Problem # 6:  Preventive Health Care (ICD-V70.0) Assessment: Comment Only Tetanus shot given.  Complete Medication List: 1)  Ventolin Hfa 108 (90 Base) Mcg/act  Aers (Albuterol sulfate) .... 2 puffs inhaled every 4-6 hours as needed 2)  Fish Oil 1360 Mg Caps (Omega-3 fatty acids) .... Take 1 tablet by mouth once a day 3)  Aspirin 81 Mg Tabs (Aspirin) .... Take one tablet by mouth daily 4)  Cod Liver Oil 1000 Mg Caps (Cod liver oil) 5)  Vitamin B-6 250 Mg Tabs (Pyridoxine hcl) .... Take two tables by mouth daily 6)  Hydrocortisone 2.5 % Oint (Hydrocortisone) .... Apply thin layer over area of itching twice a day 7)  Synthroid 50 Mcg Tabs (Levothyroxine sodium) .... Take 1 tablet by mouth once a day 8)  Lisinopril 5 Mg Tabs (Lisinopril) .... Take 1 tablet by mouth once a day 9)  Amitriptyline Hcl 25 Mg Tabs (Amitriptyline hcl) .... Take 1 tablet by mouth at bedtime for one week and then stop 10)  Omeprazole 20 Mg Tbec (Omeprazole) .... Take 1 tablet by mouth once a day 11)  Pravachol 40 Mg Tabs (Pravastatin sodium) .... Take 1 tablet by mouth once a day 12)  Singulair 10 Mg Tabs (Montelukast sodium) .... Take 1 tablet by mouth once a day 13)  Sertraline Hcl 25 Mg Tabs (Sertraline hcl) .... Take 1 tab by mouth every night 14)  Advair Hfa 115-21 Mcg/act Aero (Fluticasone-salmeterol) .... Inhale 2 puffs 2 times a day 15)  Doxycycline Hyclate 100 Mg Tabs (Doxycycline hyclate) .... Take 1 tablet by mouth two times a day 16)  Prednisone 20 Mg Tabs (Prednisone) .... Take 3 tabs for 3 days, then 2 tabs for 2 days, 1 tab for next 2 days and 1/2 tab for next 2 days and then stop.  Patient Instructions: 1)  Please schedule a follow-up appointment as needed. 2)  Please schedule an appointment with your primary doctor. 3)  Tobacco is very bad for your health and your loved ones! You Should stop smoking!. 4)  Stop Smoking Tips: Choose a Quit date. Cut down before the Quit  date. decide what you will do as a substitute when you feel the urge to smoke(gum,toothpick,exercise). 5)  It is important that you exercise regularly at least 20 minutes 5 times a week. If you develop chest pain, have severe difficulty breathing, or feel very tired , stop exercising immediately and seek medical attention. 6)  You need to lose weight. Consider a lower calorie diet and regular exercise.  7)  Check your Blood Pressure regularly. If it is above: 140/90 you should make an appointment. 8)  Get plenty of rest, drink lots of clear liquids, and use Tylenol or Ibuprofen for fever and comfort. Return in 7-10 days if you're not better:sooner if you're feeling worse. 9)  Take your antibiotic as prescribed until ALL of it is gone, but stop if you develop a rash or swelling and contact our office as soon as possible. Prescriptions: SERTRALINE HCL 25 MG TABS (SERTRALINE HCL) take 1 tab by mouth every night  #30 x 1   Entered and Authorized by:   Janell Quiet MD   Signed by:   Janell Quiet MD on 05/29/2009   Method used:   Electronically to        [facility] [phone]* (retail)       [place],   [address]       Ph: [phone]       Fax: [phone]   RxID:   [id number] PREDNISONE 20 MG TABS (PREDNISONE) take 3 tabs for 3  days, then 2 tabs for 2 days, 1 tab for next 2 days and 1/2 tab for next 2 days and then stop.  #16 x 0   Entered and Authorized by:   Janell Quiet MD   Signed by:   Janell Quiet MD on 05/29/2009   Method used:   Print then Give to Patient   RxID:   [id number] ADVAIR HFA 115-21 MCG/ACT AERO (FLUTICASONE-SALMETEROL) inhale 2 puffs 2 times a day  #1 x 11   Entered and Authorized by:   Janell Quiet MD   Signed by:   Janell Quiet MD on 05/29/2009   Method used:   Print then Give to Patient   RxID:   [id number] SERTRALINE HCL 25 MG TABS (SERTRALINE HCL) take 1 tab by mouth every night  #30 x 1   Entered and Authorized by:   Janell Quiet  MD   Signed by:   Janell Quiet MD on 05/29/2009   Method used:   Electronically to        [facility] [phone]* (retail)       [address]       [place], [place]  [address]       Ph: [phone]       Fax: [phone]   RxID:   [id number]    Prevention & Chronic Care Immunizations   Influenza vaccine: Fluvax 3+  (11/28/2008)   Influenza vaccine deferral: Deferred  (11/01/2008)    Tetanus booster: Not documented   Td booster deferral: Deferred  (11/01/2008)    Pneumococcal vaccine: Not documented  Colorectal Screening   Hemoccult: Not documented   Hemoccult action/deferral: Deferred  (05/29/2009)    Colonoscopy: Not documented   Colonoscopy action/deferral: Deferred  (05/29/2009)  Other Screening   Pap smear: UNSATISFACTORY FOR EVALUATION.  THE SPECIMEN IS PROCESSED  (07/25/2008)    Mammogram: ASSESSMENT: Negative - BI-RADS 1^Kendra DIGITAL SCREENING  (12/13/2008)   Mammogram action/deferral: Ordered  (10/18/2008)   Smoking status: quit  (05/29/2009)  Lipids   Total Cholesterol: 214  (01/17/2009)   Lipid panel action/deferral: Lipid Panel ordered   LDL: 148  (01/17/2009)   LDL Direct: Not documented   HDL: 34  (01/17/2009)   Triglycerides: 162  (01/17/2009)    SGOT (AST): 24  (01/17/2009)   BMP action: Ordered   SGPT (ALT): 30  (01/17/2009)   Alkaline phosphatase: 107  (01/17/2009)   Total bilirubin: 0.3  (01/17/2009)    Lipid flowsheet reviewed?: Yes   Progress toward LDL goal: Unchanged  Hypertension   Last Blood Pressure: 118 / 87  (05/29/2009)   Serum creatinine: 0.68  (01/17/2009)   BMP action: Ordered   Serum potassium 4.8  (01/17/2009)    Hypertension flowsheet reviewed?: Yes   Progress toward BP goal: At goal  Self-Management Support :   Personal Goals (by the next clinic visit) :      Personal blood pressure goal: 130/80  (11/28/2008)     Personal LDL goal: 130  (11/28/2008)    Patient will work on the following items until the next  clinic visit to reach self-care goals:     Medications and monitoring: take my medicines every day  (05/29/2009)     Eating: eat foods that are low in salt, eat baked foods instead of fried foods  (05/29/2009)     Activity: take a 30 minute walk every day  (05/29/2009)     Other: yard work  (01/18/2009)    Hypertension self-management support: Pre-printed educational  material, Resources for patients handout, Education handout  (05/29/2009)   Hypertension education handout printed    Lipid self-management support: Pre-printed educational material, Resources for patients handout, Education handout  (05/29/2009)     Lipid education handout printed      Resource handout printed.

## 2010-04-11 NOTE — Assessment & Plan Note (Signed)
Summary: allergies/gg   Vital Signs:  Patient profile:   50 year old female Height:      63 inches Weight:      268.5 pounds BMI:     47.73 Temp:     98.3 degrees F oral Pulse rate:   93 / minute BP sitting:   124 / 89  (right arm)  Vitals Entered By: Silverio Decamp NT II (August 20, 2009 2:37 PM) CC: [place], Depression Is Patient Diabetic? No Pain Assessment Patient in pain? no      Nutritional Status BMI of > 30 = obese  Have you ever been in a relationship where you felt threatened, hurt or afraid?No   Does patient need assistance? Functional Status Self care Ambulation Normal   Primary Care Provider:  Kathlen Brunswick MD  CC:  SINUSAND CHEST CONJESTION SINCE TUESDAY and Depression.  History of Present Illness: Patient has history of asthmatic bronchitis and has susceptibility for infections in the past per her report. One week ago, started having increased mucus production and trouble with chest congestion in upper airways. Patient has sick contact in household from whom she believes she and her husband contracted the illness. Patient denies feeling like there is any congestion in the lower lungs. Also complains of sinus pain and headaches and coughing spells that make the headaches worse. Husband has same complaints and was recently diagnosed with viral URI and given Mucinex. Patient states that she has felt warm at times, but no has not had fever or chills. Cough is mildly productive, but it seems like a lot of phlegm is stuck in upper airways. Has been using rescue inhaler somewhat more frequently during past week.  Depression History:      The patient denies a depressed mood most of the day and a diminished interest in her usual daily activities.         Preventive Screening-Counseling & Management  Alcohol-Tobacco     Alcohol type: occasionally     Smoking Status: quit     Smoking Cessation Counseling: yes     Packs/Day: 0.5  Year Started: smoking 33 yrs  Caffeine-Diet-Exercise     Does Patient Exercise: yes     Type of exercise: YARD WORK  Current Medications (verified): 1)  Ventolin Hfa 108 (90 Base) Mcg/act Aers (Albuterol Sulfate) .... 2 Puffs Inhaled Every 4-6 Hours As Needed 2)  Fish Oil 1360 Mg Caps (Omega-3 Fatty Acids) .... Take 1 Tablet By Mouth Once A Day 3)  Aspirin 81 Mg Tabs (Aspirin) .... Take One Tablet By Mouth Daily 4)  Cod Liver Oil 1000 Mg Caps (Cod Liver Oil) 5)  Vitamin B-6 250 Mg Tabs (Pyridoxine Hcl) .... Take Two Tables By Mouth Daily 6)  Hydrocortisone 2.5 % Oint (Hydrocortisone) .... Apply Thin Layer Over Area of Itching Twice A Day 7)  Synthroid 75 Mcg Tabs (Levothyroxine Sodium) .... Take 1 Tablet By Mouth Once A Day 8)  Lisinopril 5 Mg Tabs (Lisinopril) .... Take 1 Tablet By Mouth Once A Day 9)  Omeprazole 40 Mg Cpdr (Omeprazole) .... Take 1 Tablet By Mouth Two Times A Day 10)  Pravachol 40 Mg Tabs (Pravastatin Sodium) .... Take 1 Tablet By Mouth Once A Day 11)  Singulair 10 Mg Tabs (Montelukast Sodium) .... Take 1 Tablet By Mouth Once A Day 12)  Sertraline Hcl 25 Mg Tabs (Sertraline Hcl) .... Take 1 Tab By Mouth Every Night 13)  Advair Hfa 115-21 Mcg/act Aero (Fluticasone-Salmeterol) .... Inhale  2 Puffs 2 Times A Day  Allergies (verified): 1)  ! Vicodin  Past History:  Past Medical History: Last updated: 04/27/2008 Carpal Tunnel syndrome (right hand surgery 1998, left hand surgery 1999) Allergic rhinitis Asthma Depression GERD Hyperlipidemia Hypothyroidism Psoriasis   Social History: Last updated: 08/20/2009 Married Current Smoker- one quarter pack per day, slowly quitting. Alcohol use-no Drug use-no  Social History: Married Current Smoker- one quarter pack per day, slowly quitting. Alcohol use-no Drug use-no   Impression & Recommendations:  Problem # 1:  UPPER RESPIRATORY INFECTION (ICD-465.9) Patient with likely viral URI, but has been 7 days since  start of symptoms and patient with congestion and mild wheezing in upper airways. Given history of asthma, will provide sample of Advair 250/50 as she has been unable to afford prescription (which was for Advair HFA 115-21, 2 puffs twice daily), as well as a short course of doxy in case bacterial infection superimposed on viral is beginning. Patient states that a similar course in March was treated with doxy and she had a good outcome and resolution of her symptoms.  Her updated medication list for this problem includes:    Aspirin 81 Mg Tabs (Aspirin) .Marland Kitchen... Take one tablet by mouth daily  Problem # 2:  HYPERTENSION (ICD-401.9) Well controlled at appointment today. No changes.  Her updated medication list for this problem includes:    Lisinopril 5 Mg Tabs (Lisinopril) .Marland Kitchen... Take 1 tablet by mouth once a day  BP today: 124/89 Prior BP: 137/75 (07/16/2009)  Labs Reviewed: K+: 4.8 (01/17/2009) Creat: : 0.68 (01/17/2009)   Chol: 214 (01/17/2009)   HDL: 34 (01/17/2009)   LDL: 148 (01/17/2009)   TG: 162 (01/17/2009)  Problem # 3:  ASTHMA (ICD-493.90) Gave sample of Advair as patient has some wheezing on exam and URI. Will reassess more thouroughly once URI resolves. Cannot find history of PFTs and has another problem listed as COPD - may benefit from Advair Diskus (if cheaper than HFA) daily if we can get patient medications at lower cost which has been the prohibitive factor in her obtaining Advair HFA in the past. Referrred to the MAP program.  Her updated medication list for this problem includes:    Ventolin Hfa 108 (90 Base) Mcg/act Aers (Albuterol sulfate) .Marland Kitchen... 2 puffs inhaled every 4-6 hours as needed    Singulair 10 Mg Tabs (Montelukast sodium) .Marland Kitchen... Take 1 tablet by mouth once a day    Advair Diskus 250-50 Mcg/dose Aepb (Fluticasone-salmeterol) .Marland Kitchen... Take 1 puff two times a day  Pulmonary Functions Reviewed: O2 sat: 96 (01/31/2009)  Problem # 4:  Preventive Health Care  (ICD-V70.0) Patient refuses colonoscopy referral at this time as she is without any symptoms. Discussed reasons for screening and possibility of occult blood.  Problem # 5:  OBESITY (ICD-278.00) BMI over 40, discussed with patient to be at a goal BMI of less than 30 to improve health. Did not discuss changes at length, but patient aware of the need to decrease intake and increase exercise. Plan to followup with patient on progress and monitor weight loss at next visit(s). Congratulated patient about losing weight since prior visit as she has already started working on diet and exercise.  Problem # 6:  TOBACCO ABUSE (ICD-305.1) Patient continues to smoke but is cutting back. Advised patient to continue working toward quitting completely, but states that cutting back slowly is the only way that she can do so. Plan to continue to ask patient and provide medication/patch if not progressing at  next visit(s).  Complete Medication List: 1)  Ventolin Hfa 108 (90 Base) Mcg/act Aers (Albuterol sulfate) .... 2 puffs inhaled every 4-6 hours as needed 2)  Fish Oil 1360 Mg Caps (Omega-3 fatty acids) .... Take 1 tablet by mouth once a day 3)  Aspirin 81 Mg Tabs (Aspirin) .... Take one tablet by mouth daily 4)  Cod Liver Oil 1000 Mg Caps (Cod liver oil) 5)  Vitamin B-6 250 Mg Tabs (Pyridoxine hcl) .... Take two tables by mouth daily 6)  Hydrocortisone 2.5 % Oint (Hydrocortisone) .... Apply thin layer over area of itching twice a day 7)  Synthroid 75 Mcg Tabs (Levothyroxine sodium) .... Take 1 tablet by mouth once a day 8)  Lisinopril 5 Mg Tabs (Lisinopril) .... Take 1 tablet by mouth once a day 9)  Omeprazole 40 Mg Cpdr (Omeprazole) .... Take 1 tablet by mouth two times a day 10)  Pravachol 40 Mg Tabs (Pravastatin sodium) .... Take 1 tablet by mouth once a day 11)  Singulair 10 Mg Tabs (Montelukast sodium) .... Take 1 tablet by mouth once a day 12)  Sertraline Hcl 25 Mg Tabs (Sertraline hcl) .... Take 1 tab by  mouth every night 13)  Advair Diskus 250-50 Mcg/dose Aepb (Fluticasone-salmeterol) .... Take 1 puff two times a day 14)  Doxycycline Hyclate 100 Mg Caps (Doxycycline hyclate) .... Take 1 capsule by mouth two times a day  Patient Instructions: 1)  Please schedule a follow-up appointment in 3 months. 2)  Tobacco is very bad for your health and your loved ones! You Should stop smoking!. 3)  Stop Smoking Tips: Choose a Quit date. Cut down before the Quit date. decide what you will do as a substitute when you feel the urge to smoke(gum,toothpick,exercise). 4)  It is important that you exercise regularly at least 20 minutes 5 times a week. If you develop chest pain, have severe difficulty breathing, or feel very tired , stop exercising immediately and seek medical attention. 5)  Take your antibiotic as prescribed until ALL of it is gone, but stop if you develop a rash or swelling and contact our office as soon as possible. 6)  Please see the Health Department for medication assistance with Advair. Prescriptions: ADVAIR DISKUS 250-50 MCG/DOSE AEPB (FLUTICASONE-SALMETEROL) Take 1 puff two times a day  #1 x 0   Entered and Authorized by:   Luvenia Starch MD   Signed by:   Luvenia Starch MD on 08/21/2009   Method used:   Samples Given   RxID:   [id number] DOXYCYCLINE HYCLATE 100 MG CAPS (DOXYCYCLINE HYCLATE) Take 1 capsule by mouth two times a day  #10 x 0   Entered and Authorized by:   Luvenia Starch MD   Signed by:   Luvenia Starch MD on 08/20/2009   Method used:   Electronically to        [facility] [phone]* (retail)       [place], [place]  [address]       Ph: [phone]       Fax: [phone]   RxID:   [id number] SERTRALINE HCL 25 MG TABS (SERTRALINE HCL) take 1 tab by mouth every night  #30 x 1   Entered and Authorized by:   Luvenia Starch MD   Signed by:   Luvenia Starch MD on 08/20/2009   Method used:   Electronically to        [facility]  [phone]* (  retail)       [place], [place]  [address]       Ph: [phone]       Fax: [phone]   RxID:   [id number] OMEPRAZOLE 40 MG CPDR (OMEPRAZOLE) Take 1 tablet by mouth two times a day  #60 x 3   Entered and Authorized by:   Luvenia Starch MD   Signed by:   Luvenia Starch MD on 08/20/2009   Method used:   Electronically to        [facility] [phone]* (retail)       [address]       [place], [place]  [address]       Ph: [phone]       Fax: [phone]   RxID:   [id number]     Prevention & Chronic Care Immunizations   Influenza vaccine: Fluvax 3+  (11/28/2008)   Influenza vaccine deferral: Deferred  (11/01/2008)    Tetanus booster: Not documented   Td booster deferral: Deferred  (11/01/2008)    Pneumococcal vaccine: Not documented  Colorectal Screening   Hemoccult: Not documented   Hemoccult action/deferral: Deferred  (05/29/2009)    Colonoscopy: Not documented   Colonoscopy action/deferral: Refused  (08/20/2009)  Other Screening   Pap smear: UNSATISFACTORY FOR EVALUATION.  THE SPECIMEN IS PROCESSED  (07/25/2008)    Mammogram: ASSESSMENT: Negative - BI-RADS 1^MS DIGITAL SCREENING  (12/13/2008)   Mammogram action/deferral: Ordered  (10/18/2008)   Smoking status: quit  (08/20/2009)    Screening comments: No complaints with bowels at this time - wants to defer colonoscopy at this time.  Lipids   Total Cholesterol: 214  (01/17/2009)   Lipid panel action/deferral: Lipid Panel ordered   LDL: 148  (01/17/2009)   LDL Direct: Not documented   HDL: 34  (01/17/2009)   Triglycerides: 162  (01/17/2009)    SGOT (AST): 24  (01/17/2009)   BMP action: Ordered   SGPT (ALT): 30  (01/17/2009)   Alkaline phosphatase: 107  (01/17/2009)   Total bilirubin: 0.3  (01/17/2009)    Lipid flowsheet reviewed?: Yes   Progress toward LDL goal: At goal  Hypertension   Last Blood Pressure: 124 / 89  (08/20/2009)   Serum creatinine: 0.68   (01/17/2009)   BMP action: Ordered   Serum potassium 4.8  (01/17/2009)    Hypertension flowsheet reviewed?: Yes   Progress toward BP goal: At goal  Self-Management Support :   Personal Goals (by the next clinic visit) :      Personal blood pressure goal: 130/80  (11/28/2008)     Personal LDL goal: 130  (11/28/2008)    Hypertension self-management support: Written self-care plan  (08/20/2009)   Hypertension self-care plan printed.    Lipid self-management support: Written self-care plan  (08/20/2009)   Lipid self-care plan printed.

## 2010-04-11 NOTE — Progress Notes (Signed)
Summary: Refill/gh  Phone Note Refill Request Message from:  Fax from Pharmacy on September 06, 2008 9:19 AM  Refills Requested: Medication #1:  SYNTHROID 50 MCG TABS Take 1 tablet by mouth once a day   Last Refilled: 08/06/2008  Medication #2:  LISINOPRIL 5 MG TABS Take 1 tablet by mouth once a day   Last Refilled: 08/06/2008  Method Requested: Electronic Initial call taken by: Sander Nephew RN,  September 06, 2008 9:20 AM  Follow-up for Phone Call        Refilled electronically.  Follow-up by: Adrian Prows MD,  September 08, 2008 3:38 PM      Prescriptions: LISINOPRIL 5 MG TABS (LISINOPRIL) Take 1 tablet by mouth once a day  #30 x 3   Entered by:   Adrian Prows MD   Authorized by:   Rudie Meyer MD   Signed by:   Adrian Prows MD on 09/08/2008   Method used:   Electronically to        [facility] [phone]* (retail)       [place], [place]  [address]       Ph: [phone]       Fax: [phone]   RxID:   [id number] SYNTHROID 50 MCG TABS (LEVOTHYROXINE SODIUM) Take 1 tablet by mouth once a day  #30 x 3   Entered by:   Adrian Prows MD   Authorized by:   Rudie Meyer MD   Signed by:   Adrian Prows MD on 09/08/2008   Method used:   Electronically to        [facility] [phone]* (retail)       [address]       [place], [place]  [address]       Ph: [phone]       Fax: [phone]   RxID:   [id number]

## 2010-04-11 NOTE — Assessment & Plan Note (Signed)
Summary: NEW TO CLINIC AND MD/DS   Vital Signs:  Patient Profile:   50 Years Old Female Height:     64 inches (162.56 cm) Weight:      255.3 pounds (116.05 kg) BMI:     43.98 Temp:     97.1 degrees F (36.17 degrees C) oral Pulse rate:   112 / minute BP sitting:   121 / 87  (right arm)  Pt. in pain?   no  Vitals Entered By: Morrison Old RN (April 27, 2008 3:27 PM)              Is Patient Diabetic? No Nutritional Status BMI of > 30 = obese  Have you ever been in a relationship where you felt threatened, hurt or afraid?No   Does patient need assistance? Functional Status Self care Ambulation Normal Comments Husband' cousin, who is mentally challenged, lives with her.     Chief Complaint:  New to clinic.  History of Present Illness: 50 year old woman with pmhx as described below is new to the clinic. Main concern is depression. Patient has not been taking medication since 2 weeks. Since patient has not taking medication she is experiencing more mood swings.  Patient states that she would never commit suicide. Patient complains of feeling of the room spinning. Patient states that every time she gets off depression medication she gets these symptoms. Patient was taking celexa for depression. Patient states that she is taking dramamine which is helping feeling of the room spining. Patient went to emergency room in Feb 8 for this problem. 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   Patient has been itching and burning in her abdominal area, groin, lower extremities, crease of buttocks since three weeks. Husband also has symptoms. Feels it more at night. Patient is using scabies cream that she was given by emergency room.  [facility] was patient's primary physician.  Acute Visit History: The patient has a prior history of GERD.         Depression History:      The patient denies a depressed mood most of the day and a diminished interest in her usual daily activities.            Updated Prior Medication List: VENTOLIN HFA 108 (90 BASE) MCG/ACT AERS (ALBUTEROL SULFATE) 2 puffs inhaled every 4-6 hours as needed FISH OIL 1000 MG CAPS (OMEGA-3 FATTY ACIDS) Takes 2 tablets by mouth daily ASPIRIN 81 MG TABS (ASPIRIN) Take one tablet by mouth daily COD LIVER OIL 1000 MG CAPS (COD LIVER OIL)   Current Allergies: ! VICODIN  Past Medical History:    Reviewed history and no changes required:       Carpal Tunnel syndrome (right hand surgery 1998, left hand surgery 1999)       Allergic rhinitis       Asthma       Depression       GERD       Hyperlipidemia       Hypothyroidism       Psoriasis    Family History:    Reviewed history and no changes required:       Family History Diabetes 1st degree relative       Family History Hypertension       Family History of Father died of Suicide (gunshot)  in 31  Social History:    Reviewed history and no changes required:       Married  Current Smoker       Alcohol use-no       Drug use-no   Risk Factors:  Tobacco use:  current    Year started:  smoking 31 yrs    Cigarettes:  Yes -- <1 pack(s) per day    Counseled to quit/cut down tobacco use:  yes Drug use:  no Alcohol use:  no Exercise:  no Seatbelt use:  100 %   Review of Systems       The patient complains of dyspnea on exertion, headaches, and severe indigestion/heartburn.  The patient denies fever, chest pain, peripheral edema, prolonged cough, abdominal pain, melena, hematochezia, hematuria, muscle weakness, and abnormal bleeding.         Hot flashes for the last 3-4 years.   Physical Exam  General:     NAD Eyes:     EOMI, pupils equal, pupils round, and pupils reactive to light.   Ears:     no external deformities.   Nose:     no external erythema.   Mouth:     pharynx pink and moist.   Lungs:     Normal respiratory effort, chest expands symmetrically. Lungs are clear to auscultation, no crackles or wheezes. Heart:     Normal  rate and regular rhythm. S1 and S2 normal without gallop, murmur, click, rub or other extra sounds. Abdomen:     soft, non-tender, and normal bowel sounds.   Msk:     normal ROM.   Extremities:     No edema or cyanosis Neurologic:     alert & oriented X3, cranial nerves II-XII intact, strength normal in all extremities, and sensation intact to light touch.   Skin:     rash on crease of abdominal fold, groin area, crease of buttocks  scaly rash on hands    Impression & Recommendations:  Problem # 1:  SKIN RASH (ICD-782.1) Unlikely to be scabies. Patient has given herself 3 treatments of permetrin cream from neck to toe as directed without resolution of rashes. There is a possibility that husband could be reinfecting patient if this is truely scabies. Differential includes bedbugs (as there seemed to be a linear distribution in one area), and tinea. Will have patient stop using permetrin cream and start symptomatic relief with topical steroid. Will reasses in two weeks.  Her updated medication list for this problem includes:    Hydrocortisone 2.5 % Oint (Hydrocortisone) .Marland Kitchen... Apply thin layer over area of itching twice a day  Orders: T-CBC w/Diff ([id number])   Problem # 2:  DEPRESSION (ICD-311) Patient has not suicidal ideation at this time. Will restart patient on celexa. Will rule out other etiologies that may be causing symptoms such as hypothyroidism. Will get consent to get records from previous primary care physician.   Her updated medication list for this problem includes:    Celexa 20 Mg Tabs (Citalopram hydrobromide) .Marland Kitchen... Take one tablet by mouth daily  Future Orders: T-Lipid Profile ([id number]) ... 04/28/2008 T-Comprehensive Metabolic Panel ([id number]) ... 04/28/2008 T-TSH [id number]) ... 04/28/2008 T-T4, Free [id number]) ... 04/28/2008   Problem # 3:  HYPERLIPIDEMIA (ICD-272.4) Will review records from previous primary physician to see what medication if  any patient was on. In the mean time will check Lipid panel and cmet. Will review results of labs and reasses.  Problem # 4:  ASTHMA (ICD-493.90) Stable. Will have patient continue current treatment.   Her updated medication list for this problem includes:    Ventolin Hfa 108 (90  Base) Mcg/act Aers (Albuterol sulfate) .Marland Kitchen... 2 puffs inhaled every 4-6 hours as needed   Complete Medication List: 1)  Ventolin Hfa 108 (90 Base) Mcg/act Aers (Albuterol sulfate) .... 2 puffs inhaled every 4-6 hours as needed 2)  Fish Oil 1000 Mg Caps (Omega-3 fatty acids) .... Takes 2 tablets by mouth daily 3)  Aspirin 81 Mg Tabs (Aspirin) .... Take one tablet by mouth daily 4)  Cod Liver Oil 1000 Mg Caps (Cod liver oil) 5)  Chlor-trimeton Allergy 12 Mg Cr-tabs (Chlorpheniramine maleate) .... Take two tablets by mouth dailly 6)  Vitamin B-6 250 Mg Tabs (Pyridoxine hcl) .... Take two tables by mouth daily 7)  Dramamine 50 Mg Tabs (Dimenhydrinate) .... Taking 2 tablets by mouth daily 8)  Hydrocortisone 2.5 % Oint (Hydrocortisone) .... Apply thin layer over area of itching twice a day 9)  Celexa 20 Mg Tabs (Citalopram hydrobromide) .... Take one tablet by mouth daily   Patient Instructions: 1)  Please schedule a follow-up appointment in 2 weeks. 2)  Place hydrocortisone cream thin layer of area that is itching twice a day do not put on face. 3)  Start Celexa 45m one tablet by mouth daily. 4)  Come fasting to get blood test done. 5)  Tobacco is very bad for your health and your loved ones! You Should stop smoking!. 6)  Stop Smoking Tips: Choose a Quit date. Cut down before the Quit date. decide what you will do as a substitute when you feel the urge to smoke(gum,toothpick,exercise).   Prescriptions: CELEXA 20 MG TABS (CITALOPRAM HYDROBROMIDE) Take one tablet by mouth daily  #30 x 3   Entered and Authorized by:   RRudie MeyerMD   Signed by:   RRudie MeyerMD on 04/27/2008   Method used:    Print then Give to Patient   RxID:   [id number] 2.5 % OINT (HYDROCORTISONE) Apply thin layer over area of itching twice a day  #30gm x 1   Entered and Authorized by:   RRudie MeyerMD   Signed by:   RRudie MeyerMD on 04/27/2008   Method used:   Print then Give to Patient   RxID:   [id number]

## 2010-04-11 NOTE — Assessment & Plan Note (Signed)
Summary: 2WK FU/VEGA/VS   Vital Signs:  Patient Profile:   50 Years Old Female Height:     64 inches (162.56 cm) Weight:      256.6 pounds (116.64 kg) BMI:     44.20 Temp:     97.5 degrees F (36.39 degrees C) oral Pulse rate:   91 / minute BP sitting:   149 / 96  (right arm)  Pt. in pain?   no  Vitals Entered By: Morrison Old RN (May 10, 2008 2:44 PM)              Is Patient Diabetic? No Nutritional Status BMI of > 30 = obese  Have you ever been in a relationship where you felt threatened, hurt or afraid?No   Does patient need assistance? Functional Status Self care Ambulation Normal     Chief Complaint:  3 week f/u - states rash is better.  History of Present Illness: Pt is 50 yo F with depression, hypothyroidism who presents for 2 week follow-up. Pt had been seen previously for groin rash (erythematous, scaling, non-confluent, pruritic x 2 months), 1st in ED and given permerthin for potential scabies and then given hydrocortisone cream for rash. Pt's rash has improved some with hydrocortisone cream, but it is still bothering patient and she continues to scratch at it some. Of note, pt's husband has similar lesion and both of them had been treated with permerthin at same time. No recent travel, no new contact allergens.    Depression History:      The patient denies a depressed mood most of the day and a diminished interest in her usual daily activities.           Current Allergies: ! VICODIN    Risk Factors: Tobacco use:  current    Year started:  smoking 33 yrs    Cigarettes:  Yes -- <1 pack(s) per day Drug use:  no Alcohol use:  no Exercise:  no Seatbelt use:  100 %    Physical Exam  General:     alert and well-developed.   Lungs:     normal respiratory effort, no crackles, and no wheezes.   Heart:     normal rate, regular rhythm, no murmur, no gallop, and no rub.   Abdomen:     soft and non-tender.  Obese. Skin:     Scattered erythematous  round lesions on inner thighs bilaterally with scaling.   Confluent red rash in abdominal skin fold  Chronic thick scaling lesion in thenar crease - previously seen by dermatologist and diagnosed as eczema. Psych:     Euthymic.    Impression & Recommendations:  Problem # 1:  HYPOTHYROIDISM (ICD-244.9) Pt TSH elevated and T4 low on last visit. Pt had been previously on synthroid, but stopped it 2/2 finances. Will start back on synthroid at 50 mcg/ day and check TSH in 2 weeks.  Her updated medication list for this problem includes:    Synthroid 50 Mcg Tabs (Levothyroxine sodium) .Marland Kitchen... Take 1 tablet by mouth once a day  Labs Reviewed: TSH: 12.089 (04/28/2008)    Chol: 231 (04/28/2008)   HDL: 32 (04/28/2008)   LDL: 148 (04/28/2008)   TG: 254 (04/28/2008)   Problem # 2:  ELEVATED BLOOD PRESSURE WITHOUT DIAGNOSIS OF HYPERTENSION (ICD-796.2) Will start pt on lisinopril as pt concerned about blood pressure and only have one elevated reading, so can't technically diagnose her with HTN, but it is likely that she carries the diagnosis. Last  BMET done two weeks ago WNL. Will check BMET at next appt in two weeks.  BP today: 149/96 Prior BP: 121/87 (04/27/2008)  Labs Reviewed: Creat: 0.79 (04/28/2008) Chol: 231 (04/28/2008)   HDL: 32 (04/28/2008)   LDL: 148 (04/28/2008)   TG: 254 (04/28/2008)  Instructed in low sodium diet (DASH Handout) and behavior modification.   Her updated medication list for this problem includes:    Lisinopril 5 Mg Tabs (Lisinopril) .Marland Kitchen... Take 1 tablet by mouth once a day   Problem # 3:  SKIN RASH (ICD-782.1) Unsure of etiology. Have performed punch biopsy and will send results off for pathology and fungal culture. Will follow up results and see if this changes treatment. If scabies or fungal lesion, will treat accordingly. Will continue hydrocortisone for now.  Her updated medication list for this problem includes:    Hydrocortisone 2.5 % Oint (Hydrocortisone)  .Marland Kitchen... Apply thin layer over area of itching twice a day  Orders: T-KOH Prep Fungal ([id number]) Punch Biopsy- [facility] (11100)   Problem # 4:  DEPRESSION (ICD-311) Doing well with celexa. Will follow up at next appt.  Her updated medication list for this problem includes:    Celexa 20 Mg Tabs (Citalopram hydrobromide) .Marland Kitchen... Take one tablet by mouth daily   Problem # 5:  TOBACCO ABUSE (ICD-305.1) Pt smokes because she does not want to gain weight and become like an obese aunt that just died. I stressed to her the health risks of smoking. She acknowledged and will consider cessation in the future. Will follow-up at the next visit.   Complete Medication List: 1)  Ventolin Hfa 108 (90 Base) Mcg/act Aers (Albuterol sulfate) .... 2 puffs inhaled every 4-6 hours as needed 2)  Fish Oil 1000 Mg Caps (Omega-3 fatty acids) .... Takes 2 tablets by mouth daily 3)  Aspirin 81 Mg Tabs (Aspirin) .... Take one tablet by mouth daily 4)  Cod Liver Oil 1000 Mg Caps (Cod liver oil) 5)  Chlor-trimeton Allergy 12 Mg Cr-tabs (Chlorpheniramine maleate) .... Take two tablets by mouth dailly 6)  Vitamin B-6 250 Mg Tabs (Pyridoxine hcl) .... Take two tables by mouth daily 7)  Dramamine 50 Mg Tabs (Dimenhydrinate) .... Taking 2 tablets by mouth daily 8)  Hydrocortisone 2.5 % Oint (Hydrocortisone) .... Apply thin layer over area of itching twice a day 9)  Celexa 20 Mg Tabs (Citalopram hydrobromide) .... Take one tablet by mouth daily 10)  Synthroid 50 Mcg Tabs (Levothyroxine sodium) .... Take 1 tablet by mouth once a day 11)  Lisinopril 5 Mg Tabs (Lisinopril) .... Take 1 tablet by mouth once a day   Patient Instructions: 1)  Please schedule a follow-up appointment in 2 weeks. 2)  We should have the results of the skin biopsy from today within a few days. We may change your treatment based on those findings. 3)  Please take the synthroid as directed for low thyroid. We will check your thyroid level at your next  appointment.   Prescriptions: LISINOPRIL 5 MG TABS (LISINOPRIL) Take 1 tablet by mouth once a day  #30 x 3   Entered and Authorized by:   Darlyne Russian MD   Signed by:   Darlyne Russian MD on 05/10/2008   Method used:   Print then Give to Patient   RxID:   [id number] SYNTHROID 50 MCG TABS (LEVOTHYROXINE SODIUM) Take 1 tablet by mouth once a day  #30 x 3   Entered and Authorized by:   Darlyne Russian MD   Signed  by:   Darlyne Russian MD on 05/10/2008   Method used:   Print then Give to Patient   RxID:   [id number]   Appended Document: Orders Update    Clinical Lists Changes  Orders: Added new Test order of T-Culture, Fungus w/o Smear ([id number]) - Signed

## 2010-04-11 NOTE — Progress Notes (Signed)
Summary: head injury/ hla  Phone Note Call from Patient   Summary of Call: pt states she fell at home sun night, hit head on kitchen table, went to [facility] ED, states she was told "nothing was broke". she has started having severe sudden h/a's, blurry vision and N&V. she is asked to go to [facility] asap, do not drive. she is agreeable. she is asked to bring her meds w/ her. nothing aggravates, nothing relieves. she states she is leaving now and spouse will drive Initial call taken by: Freddy Finner RN,  November 14, 2009 11:41 AM  Follow-up for Phone Call        Agree with ER Eval. Thanks Follow-up by: Larey Dresser MD,  November 14, 2009 11:54 AM

## 2010-04-11 NOTE — Assessment & Plan Note (Signed)
Summary: PER GLADYS/SB.   Vital Signs:  Patient profile:   50 year old female Height:      63 inches (160.02 cm) Weight:      263.6 pounds (120.41 kg) BMI:     47.09 Temp:     97.0 degrees F (36.11 degrees C) oral Pulse rate:   108 / minute BP sitting:   120 / 97  (left arm)  Vitals Entered By: Hilda Blades Ditzler RN (January 31, 2009 1:36 PM) [MLI_FORM:[id number]] [MLI_FORM:[id number]][MLI_FORM:[id number]] [XYV_OPFY:[id number]][FBX_UXYB:[id number]][HTX_HFSF:[id number]][SHU_OHFG:[id number]][KPQ_AESL:[id number]][APO_LIDC:[id number]][QAS_UORV:[id number]] [MLI_FORM:[id number]][MLI_FORM:[id number]][MLI_FORM:[id number]][MLI_FORM:[id number]][MLI_FORM:[id number]] [KHV_FMBB:[id number]]

## 2010-04-11 NOTE — Progress Notes (Signed)
Summary: URI sxs  Phone Note Call from Patient Call back at [phone]   Caller: Patient Summary of Call: Patient called stating that she has URI sxs and was seen at the ED yesterday. Was given abx but she doesn't think it will work because the only thing that usually works is 500 mg pcn. Patients wants an appt. or change in med. Initial call taken by: Jarrett Ables CMA,  May 29, 2009 12:26 PM  Follow-up for Phone Call        called spoke w/ pt, she wants another abx, appt made for this pm. Follow-up by: Freddy Finner RN,  May 29, 2009 1:55 PM  Additional Follow-up for Phone Call Additional follow up Details #1::        Pt already here for her appt. Additional Follow-up by: Larey Dresser MD,  May 29, 2009 2:42 PM

## 2010-04-11 NOTE — Assessment & Plan Note (Signed)
Summary: asthma exacerbation/pcp-vega-[place]/hla   Vital Signs:  Patient profile:   50 year old female Height:      63 inches (160.02 cm) Weight:      257.4 pounds (117.00 kg) BMI:     45.76 O2 Sat:      95 % on Room air Temp:     97.2 degrees F (36.22 degrees C) oral Pulse rate:   104 / minute BP sitting:   146 / 99  (right arm)  Vitals Entered By: Hilda Blades Ditzler RN (October 18, 2008 1:45 PM)  O2 Flow:  Room air Is Patient Diabetic? No Pain Assessment Patient in pain? yes     Location: right hip Intensity: 5-10+ Onset of pain  Since '00 off and on Nutritional Status BMI of > 30 = obese Nutritional Status Detail appetite down  Have you ever been in a relationship where you felt threatened, hurt or afraid?denies   Does patient need assistance? Functional Status Self care Ambulation Normal Comments Past 5 days - chest congestio with lt green-yellow productive cough. Head congestion. Refill on Ventolin. Hot flashes.   Primary Care Provider:  Kathlen Brunswick MD   History of Present Illness: 50 yo F, with pmh of seasonal asthma (spring, fall) for 10 years presents to the outpatient clinic c/o productive cough since last friday, sputum is thich green-yellow in the morning, throughout the day it becomes clear white.  Pt denies fever, chills, denies any blood in sputum.  Patient is otherwise well, and only complains of mild R.hip pain that has been there 2000 after an injury, and hot flashes 2-3/day for the last 5 years.   On review of systems, she denies any prior history of stroke, TIA, deep venous thrombosis, pulmonary embolism, bleeding at the time of surgery, myalgias, joint pains, cough, hemoptysis, black stools or red stools. She denies recent fevers, chills or rigors. She denies exertional buttock or calf pain. All of the other review of systems were negative.     Depression History:      The patient denies a depressed mood most of the day and a diminished  interest in her usual daily activities.  Positive alarm features for depression include fatigue (loss of energy).  However, she denies recurrent thoughts of death or suicide.         Preventive Screening-Counseling & Management  Alcohol-Tobacco     Alcohol type: occasionally     Smoking Status: never     Smoking Cessation Counseling: yes     Packs/Day: 1/3 ppd     Year Started: smoking 33 yrs  Caffeine-Diet-Exercise     Does Patient Exercise: no  Current Medications (verified): 1)  Ventolin Hfa 108 (90 Base) Mcg/act Aers (Albuterol Sulfate) .... 2 Puffs Inhaled Every 4-6 Hours As Needed 2)  Fish Oil 1360 Mg Caps (Omega-3 Fatty Acids) .... Take 1 Tablet By Mouth Once A Day 3)  Aspirin 81 Mg Tabs (Aspirin) .... Take One Tablet By Mouth Daily 4)  Cod Liver Oil 1000 Mg Caps (Cod Liver Oil) 5)  Chlor-Trimeton Allergy 12 Mg Cr-Tabs (Chlorpheniramine Maleate) .... Take Two Tablets By Mouth Dailly 6)  Vitamin B-6 250 Mg Tabs (Pyridoxine Hcl) .... Take Two Tables By Mouth Daily 7)  Hydrocortisone 2.5 % Oint (Hydrocortisone) .... Apply Thin Layer Over Area of Itching Twice A Day 8)  Synthroid 50 Mcg Tabs (Levothyroxine Sodium) .... Take 1 Tablet By Mouth Once A Day 9)  Lisinopril 10 Mg Tabs (Lisinopril) .... Take 1 Tablet By Mouth  Once A Day 10)  Amitriptyline Hcl 25 Mg Tabs (Amitriptyline Hcl) .... Take 1 Tablet By Mouth At Bedtime For One Week Then Take 2 Tablets By Mouth At Bedtime 11)  Omeprazole 20 Mg Tbec (Omeprazole) .... Take 1 Tablet By Mouth Once A Day 12)  Pravastatin Sodium 20 Mg Tabs (Pravastatin Sodium) .... Take 1 Tablet By Mouth Once A Day  Allergies: 1)  ! Vicodin  Social History: Packs/Day:  1/3 ppd  Review of Systems Eyes:  Denies blurring, discharge, double vision, eye irritation, and eye pain. ENT:  Complains of difficulty swallowing, earache, and nasal congestion; dental cavity. . CV:  Complains of difficulty breathing while lying down and fatigue; denies chest pain  or discomfort, difficulty breathing at night, fainting, palpitations, and weight gain. Resp:  Complains of wheezing; denies chest discomfort, chest pain with inspiration, and cough. GI:  Denies abdominal pain, bloody stools, constipation, and diarrhea. GU:  Denies abnormal vaginal bleeding, discharge, and dysuria. MS:  Denies joint pain, joint redness, and joint swelling.  Physical Exam  General:  alert, well-developed, and cooperative to examination.    Head:  normocephalic and atraumatic.    Eyes:  vision grossly intact, pupils equal, pupils round, pupils reactive to light, no injection and anicteric.    Mouth:   pharynx pink and moist, no erythema, and no exudates.    Neck:  supple, full ROM, no thyromegaly, no JVD, and no carotid bruits.    Lungs:  normal respiratory effort, no accessory muscle use, no crackles, Mild B/L wheezes.  Heart:  normal rate, regular rhythm, no murmur, no gallop, and no rub.    Abdomen:  soft, non-tender, normal bowel sounds, no distention, no guarding, no rebound tenderness, no hepatomegaly, and no splenomegaly.    Msk:  no joint swelling, no joint warmth, and no redness over joints.    Pulses:  2+ DP/PT pulses bilaterally  Extremities:  No cyanosis, clubbing, edema  Neurologic:  alert & oriented X3, cranial nerves II-XII intact, strength normal in all extremities, sensation intact to light touch, and gait normal.     Skin:   turgor normal and no rashes.  Psych:  Oriented X3, memory intact for recent and remote, normally interactive, good eye contact, not anxious appearing, and not depressed appearing.    Impression & Recommendations:  Problem # 1:  ASTHMA (ICD-493.90) productive cough since last friday, sputum is thich green-yellow in the morning, throughout the day it becomes clear white.  Pt denies fever, chills, denies any blood in sputum. Will give prednisone taper for 8 days and deoxycycline for 1 week.  Her updated medication list for this problem  includes:    Ventolin Hfa 108 (90 Base) Mcg/act Aers (Albuterol sulfate) .Marland Kitchen... 2 puffs inhaled every 4-6 hours as needed    Prednisone 10 Mg Tabs (Prednisone) .Marland Kitchen... Take 4 tablets for 2 days, then 3 tablets for 2 days,then 2 tablets for 2 days,then 1 tablets for 2 days.  Pulmonary Functions Reviewed: O2 sat: 95 (10/18/2008)  Problem # 2:  TOBACCO ABUSE (ICD-305.1) Patient was counseled on smoking cessation strategies including medications and behavior modification options. Patient said she was not ready to stop smoking at this time.    Problem # 3:  HYPERTENSION (ICD-401.9) BP elevated today, if elevated by next visit consider increasing lisinopril to 71m.   BP today: 146/99 Prior BP: 124/85 (08/24/2008)  Labs Reviewed: K+: 4.0 (06/29/2008) Creat: : 0.74 (06/29/2008)   Chol: 231 (04/28/2008)   HDL: 32 (04/28/2008)  LDL: 148 (04/28/2008)   TG: 254 (04/28/2008)  Complete Medication List: 1)  Ventolin Hfa 108 (90 Base) Mcg/act Aers (Albuterol sulfate) .... 2 puffs inhaled every 4-6 hours as needed 2)  Fish Oil 1360 Mg Caps (Omega-3 fatty acids) .... Take 1 tablet by mouth once a day 3)  Aspirin 81 Mg Tabs (Aspirin) .... Take one tablet by mouth daily 4)  Cod Liver Oil 1000 Mg Caps (Cod liver oil) 5)  Chlor-trimeton Allergy 12 Mg Cr-tabs (Chlorpheniramine maleate) .... Take two tablets by mouth dailly 6)  Vitamin B-6 250 Mg Tabs (Pyridoxine hcl) .... Take two tables by mouth daily 7)  Hydrocortisone 2.5 % Oint (Hydrocortisone) .... Apply thin layer over area of itching twice a day 8)  Synthroid 50 Mcg Tabs (Levothyroxine sodium) .... Take 1 tablet by mouth once a day 9)  Lisinopril 5 Mg Tabs (Lisinopril) .... Take 1 tablet by mouth once a day 10)  Amitriptyline Hcl 25 Mg Tabs (Amitriptyline hcl) .... Take 1 tablet by mouth at bedtime for one week then take 2 tablets by mouth at bedtime 11)  Omeprazole 20 Mg Tbec (Omeprazole) .... Take 1 tablet by mouth once a day 12)  Pravastatin  Sodium 20 Mg Tabs (Pravastatin sodium) .... Take 1 tablet by mouth once a day 13)  Doxycycline Hyclate 100 Mg Caps (Doxycycline hyclate) .... Two times a day 14)  Prednisone 10 Mg Tabs (Prednisone) .... Take 4 tablets for 2 days, then 3 tablets for 2 days,then 2 tablets for 2 days,then 1 tablets for 2 days.  Other Orders: Mammogram (Screening) (Mammo)  Patient Instructions: 1)  Please schedule a follow-up appointment in 2 weeks. 2)  Take your medications as instructed Prescriptions: LISINOPRIL 5 MG TABS (LISINOPRIL) Take 1 tablet by mouth once a day  #30 x 3   Entered by:   Vertell Limber MD   Authorized by:   Thomes Lolling MD   Signed by:   Vertell Limber MD on 10/18/2008   Method used:   Electronically to        [facility] [phone]* (retail)       [place], [place]  [address]       Ph: [phone]       Fax: [phone]   RxID:   [id number] PREDNISONE 10 MG TABS (PREDNISONE) take 4 tablets for 2 days, then 3 tablets for 2 days,then 2 tablets for 2 days,then 1 tablets for 2 days.  #20 x 0   Entered by:   Vertell Limber MD   Authorized by:   Thomes Lolling MD   Signed by:   Vertell Limber MD on 10/18/2008   Method used:   Electronically to        [facility] [phone]* (retail)       [place], [place]  [address]       Ph: [phone]       Fax: [phone]   RxID:   [id number] DOXYCYCLINE HYCLATE 100 MG CAPS (DOXYCYCLINE HYCLATE) two times a day  #14 x 0   Entered by:   Vertell Limber MD   Authorized by:   Thomes Lolling MD   Signed by:   Vertell Limber MD on 10/18/2008   Method used:   Electronically to        [facility] [phone]* (retail)       [address]  [place], [place]  [address]       Ph: [phone]       Fax: [phone]   RxID:   [id number] PRAVASTATIN SODIUM 20 MG TABS (PRAVASTATIN SODIUM) Take 1 tablet by mouth once a day  #30 x 3   Entered by:   Vertell Limber MD   Authorized by:    Thomes Lolling MD   Signed by:   Vertell Limber MD on 10/18/2008   Method used:   Electronically to        [facility] [phone]* (retail)       [address]       [place], [place]  [address]       Ph: [phone]       Fax: [phone]   RxID:   [id number] VENTOLIN HFA 108 (90 BASE) MCG/ACT AERS (ALBUTEROL SULFATE) 2 puffs inhaled every 4-6 hours as needed  #1 x 0   Entered by:   Vertell Limber MD   Authorized by:   Thomes Lolling MD   Signed by:   Vertell Limber MD on 10/18/2008   Method used:   Electronically to        [facility] [phone]* (retail)       [address]       [place], [place]  [address]       Ph: [phone]       Fax: [phone]   RxID:   [id number] LISINOPRIL 10 MG TABS (LISINOPRIL) Take 1 tablet by mouth once a day  #30 x 3   Entered by:   Vertell Limber MD   Authorized by:   Thomes Lolling MD   Signed by:   Vertell Limber MD on 10/18/2008   Method used:   Electronically to        [facility] [phone]* (retail)       [place], [place]  [address]       Ph: [phone]       Fax: [phone]   RxID:   [id number] PRAVASTATIN SODIUM 40 MG TABS (PRAVASTATIN SODIUM) take one tablet by mouth daily  #30 x 3   Entered by:   Vertell Limber MD   Authorized by:   Thomes Lolling MD   Signed by:   Vertell Limber MD on 10/18/2008   Method used:   Electronically to        [facility] [phone]* (retail)       [address]       [place], [place]  [address]       Ph: [phone]       Fax: [phone]   RxID:   [id number]     Prevention & Chronic Care Immunizations   Influenza vaccine: Not documented    Tetanus booster: Not documented    Pneumococcal vaccine: Not documented  Other Screening   Pap smear: UNSATISFACTORY FOR EVALUATION.  THE SPECIMEN IS PROCESSED  (07/25/2008)    Mammogram: Not documented   Mammogram action/deferral: Ordered  (10/18/2008)   Smoking status: never  (10/18/2008)   Lipids   Total Cholesterol: 231  (04/28/2008)   LDL: 148  (04/28/2008)   LDL Direct: Not documented   HDL: 32  (04/28/2008)   Triglycerides: 254  (04/28/2008)    SGOT (AST): 22  (04/28/2008)   SGPT (ALT): 29  (04/28/2008)   Alkaline phosphatase: 83  (04/28/2008)   Total bilirubin: 0.3  (04/28/2008)  Hypertension   Last Blood  Pressure: 146 / 99  (10/18/2008)   Serum creatinine: 0.74  (06/29/2008)   Serum potassium 4.0  (06/29/2008)    Hypertension flowsheet reviewed?: Yes   Progress toward BP goal: Deteriorated  Self-Management Support :    Hypertension self-management support: Not documented    Lipid self-management support: Not documented    Nursing Instructions: Schedule screening mammogram (see order)

## 2010-04-11 NOTE — Progress Notes (Signed)
Summary: phone/gg  Phone Note Call from Patient   Caller: Patient Summary of Call: Pt called stating her chest congestion was getting better but since last night it has returned.  She has a productive cough, [place]/green color.  She is okay during the day but when she lays down cough returns.  She is using inhalers and meds. but not better.  Denies fever. Pt was seen for this on the 13th.   She has completed the antibiotic. Pt # [id number] Initial call taken by: Gevena Cotton RN,  August 28, 2009 2:17 PM  Follow-up for Phone Call        If she is getting worse, then she needs seen.  She might need a CXR.  If she is overall better but the cough is just lingerig, it can take a few weeks for the cough to completly go away.  I tried to all pt and got no answer.   Follow-up by: Larey Dresser MD,  August 28, 2009 3:09 PM  Additional Follow-up for Phone Call Additional follow up Details #1::        I talked to pt and explained above.  She is feeling better and will call for any changes.  She denies fever at this time. Additional Follow-up by: Gevena Cotton RN,  August 29, 2009 2:25 PM

## 2010-04-11 NOTE — Progress Notes (Signed)
Summary: ?yeast infection//kg  Phone Note Call from Patient Call back at [phone]   Caller: Patient Call For: Rudie Meyer MD Complaint: Urinary/GYN Problems Summary of Call: Received message from pt stating that she has a yeast infection.  Has them in the past and and knows what she has. Does not want to come in for a visit as she as no money for an office visit.  Has requested  rx be called into [facility].Mateo Flow ([facility])  December 28, 2009 5:00 PM     New/Updated Medications: DIFLUCAN 150 MG TABS (FLUCONAZOLE) Take 1 tablet by mouth once a day X 1 Prescriptions: DIFLUCAN 150 MG TABS (FLUCONAZOLE) Take 1 tablet by mouth once a day X 1  #1 x 0   Entered and Authorized by:   Rudie Meyer MD   Signed by:   Rudie Meyer MD on 12/28/2009   Method used:   Electronically to        [facility] [phone]* (retail)       [address]       [place], [place]  [address]       Ph: [phone]       Fax: [phone]   RxID:   [id number]

## 2010-04-11 NOTE — Op Note (Signed)
Summary: consent  consent   Imported By: Tobin Chad 02/25/2010 10:05:31  _____________________________________________________________________  External Attachment:    Type:   Image     Comment:   External Document

## 2010-04-11 NOTE — Progress Notes (Signed)
Summary: Sore throat  Phone Note Call from Patient   Caller: Patient Call For: Rudie Meyer MD Summary of Call: Call from pt was here on Monday.  Given Singular and Prednisone.  Her throat continues to be sore.  The Mucix is not working.  Wants 10 days of an Antibiotic.  No fevers.  Coughing " violently" nothing comes up.  Problem swallowing 8 hours ago but is better.  Uses the [facility] or [facility]. Sander Nephew RN  January 31, 2009 9:07 AM   Initial call taken by: Sander Nephew RN,  January 31, 2009 9:07 AM  Follow-up for Phone Call        Dr. Tyrell Antonio will see her this morning.  Be sure the patient knows this will be a very quick visit only to look at her throat and decide what kind of antibiotic. Follow-up by: Milta Deiters MD,  January 31, 2009 9:56 AM  Additional Follow-up for Phone Call Additional follow up Details #1::        Call to pt message left with a there to tell pt she has an appointment this afternoon at 1:30 in the Clinics. Additional Follow-up by: Sander Nephew RN,  January 31, 2009 11:51 AM    New/Updated Medications: GUIATUSS AC 100-10 MG/5ML SYRP (GUAIFENESIN-CODEINE) Take 31m three times  a day for cough as needed.

## 2010-04-11 NOTE — Progress Notes (Signed)
Summary: refill/gg  Phone Note Refill Request  on January 01, 2009 4:31 PM  Refills Requested: Medication #1:  SINGULAIR 10 MG TABS Take 1 tablet by mouth once a day Pt has appointment 11/11   Method Requested: Electronic Initial call taken by: Gevena Cotton RN,  January 01, 2009 4:31 PM    Prescriptions: SINGULAIR 10 MG TABS (MONTELUKAST SODIUM) Take 1 tablet by mouth once a day  #30 x 6   Entered and Authorized by:   Rudie Meyer MD   Signed by:   Rudie Meyer MD on 01/01/2009   Method used:   Electronically to        [facility] [phone]* (retail)       [address]       [place], [place]  [address]       Ph: [phone]       Fax: [phone]   RxID:   [id number]

## 2010-04-11 NOTE — Assessment & Plan Note (Signed)
Summary: ACUTE-F/U WITH HEMATURIA PER DR VEGA/[facility]   Vital Signs:  Patient profile:   50 year old female Height:      64 inches (162.56 cm) Weight:      250.0 pounds (113.64 kg) BMI:     43.07 Temp:     98.2 degrees F (36.78 degrees C) oral Pulse rate:   99 / minute BP sitting:   134 / 89  (right arm)  Vitals Entered By: Silverio Decamp NT II (June 27, 2008 4:20 PM) CC: follow-up visit/ LABS AND RESULTS OF BIOSEY Is Patient Diabetic? No Pain Assessment Patient in pain? yes     Location: lower back Intensity: 7 Type: aching Onset of pain  Chronic Nutritional Status BMI of > 30 = obese  Have you ever been in a relationship where you felt threatened, hurt or afraid?No   Does patient need assistance? Functional Status Self care Ambulation Normal   CC:  follow-up visit/ LABS AND RESULTS OF BIOSEY.  History of Present Illness: 50 y/o WF with PMH as outlined in EMR who presents for follow up on slightly abnormal UA about one week ago in which she was noticed to have microhematuria on UA.  No infection was noted on UA or culture however.  Pt has no fevers or chills over the past week.  Denies dysuria or vaginal discharge/irritation  but still having "foul smelling" urine as before which prompted her initial UA.  Pt has no personal or family h/o bladder or kidney cancer but does smoke 1ppd for 30 years.  States that she has no polyuria, polydyspia or wt loss recently.  Also denies any recent CP, SOB, N/V, abd pain, diarrhea.    Depression History:      The patient denies a depressed mood most of the day and a diminished interest in her usual daily activities.         Preventive Screening-Counseling & Management     Alcohol type: occasionally     Smoking Status: current     Smoking Cessation Counseling: yes     Packs/Day: <1     Year Started: smoking 33 yrs     Does Patient Exercise: no  Medications Prior to Update: 1)  Ventolin Hfa 108 (90 Base) Mcg/act Aers (Albuterol Sulfate)  .... 2 Puffs Inhaled Every 4-6 Hours As Needed 2)  Fish Oil 1000 Mg Caps (Omega-3 Fatty Acids) .... Takes 2 Tablets By Mouth Daily 3)  Aspirin 81 Mg Tabs (Aspirin) .... Take One Tablet By Mouth Daily 4)  Cod Liver Oil 1000 Mg Caps (Cod Liver Oil) 5)  Chlor-Trimeton Allergy 12 Mg Cr-Tabs (Chlorpheniramine Maleate) .... Take Two Tablets By Mouth Dailly 6)  Vitamin B-6 250 Mg Tabs (Pyridoxine Hcl) .... Take Two Tables By Mouth Daily 7)  Dramamine 50 Mg Tabs (Dimenhydrinate) .... Taking 2 Tablets By Mouth Daily 8)  Hydrocortisone 2.5 % Oint (Hydrocortisone) .... Apply Thin Layer Over Area of Itching Twice A Day 9)  Celexa 20 Mg Tabs (Citalopram Hydrobromide) .... Take One Tablet By Mouth Daily 10)  Synthroid 50 Mcg Tabs (Levothyroxine Sodium) .... Take 1 Tablet By Mouth Once A Day 11)  Lisinopril 5 Mg Tabs (Lisinopril) .... Take 1 Tablet By Mouth Once A Day 12)  E.e.s. 400 400 Mg Tabs (Erythromycin Ethylsuccinate) .... Take 1 Tablet By Mouth Every 6 Hours  Current Medications (verified): 1)  Ventolin Hfa 108 (90 Base) Mcg/act Aers (Albuterol Sulfate) .... 2 Puffs Inhaled Every 4-6 Hours As Needed 2)  Fish Oil  1000 Mg Caps (Omega-3 Fatty Acids) .... Takes 2 Tablets By Mouth Daily 3)  Aspirin 81 Mg Tabs (Aspirin) .... Take One Tablet By Mouth Daily 4)  Cod Liver Oil 1000 Mg Caps (Cod Liver Oil) 5)  Chlor-Trimeton Allergy 12 Mg Cr-Tabs (Chlorpheniramine Maleate) .... Take Two Tablets By Mouth Dailly 6)  Vitamin B-6 250 Mg Tabs (Pyridoxine Hcl) .... Take Two Tables By Mouth Daily 7)  Dramamine 50 Mg Tabs (Dimenhydrinate) .... Taking 2 Tablets By Mouth Daily 8)  Hydrocortisone 2.5 % Oint (Hydrocortisone) .... Apply Thin Layer Over Area of Itching Twice A Day 9)  Celexa 20 Mg Tabs (Citalopram Hydrobromide) .... Take One Tablet By Mouth Daily 10)  Synthroid 50 Mcg Tabs (Levothyroxine Sodium) .... Take 1 Tablet By Mouth Once A Day 11)  Lisinopril 5 Mg Tabs (Lisinopril) .... Take 1 Tablet By Mouth Once A  Day  Allergies (verified): 1)  ! Vicodin  Past History:  Past Medical History:    Carpal Tunnel syndrome (right hand surgery May 21, 1996, left hand surgery 05/21/97)    Allergic rhinitis    Asthma    Depression    GERD    Hyperlipidemia    Hypothyroidism    Psoriasis  (04/27/2008)  Family History:    Family History Diabetes 1st degree relative    Family History Hypertension    Family History of Father died of Suicide (gunshot)  in 1996-05-21     (04/27/2008)  Social History:    Married    Current Smoker    Alcohol use-no    Drug use-no     (04/27/2008)  Review of Systems      See HPI  Physical Exam  General:  NAD, A&Ox3 Lungs:  CTAB Heart:  RRR, No M/R/G Abdomen:  +BS, S/NTND, no suprapubic or CVA tenderness Extremities:  No c/c/e Neurologic:  Grossly non-focal   Impression & Recommendations:  Problem # 1:  MICROSCOPIC HEMATURIA (ICD-599.72) Discussed with Dr. Drucilla Schmidt.  Pt's microscopic hematuria unlikely to be due to neoplasm despite smoking history and more likely due to occult DM esp considering pt has mild protein on UA and has FH of DM.  Last random BMET showed elevated BG of 144 (although no overt symptoms of diabetes at that time) so will have pt's A1c checked today and have pt come back later this week for fasting BMET to see if she meets diagnostic criteria of DM.    Orders: T-Hgb A1C (in-house) ([id number])Future Orders: T-Basic Metabolic Panel ([id number]) ... 06/29/2008  Problem # 2:  TOBACCO ABUSE (ICD-305.1) Pt counseled on cutting back on tobacco use and she was agreeable. Did not want formal counseling at this time but will assess progress at next visit.    Problem # 3:  HYPERTENSION (ICD-401.9) Well controlled on current meds so will continue w/o changes.  Pt does not meet diagnostic criteria for DM but if she does in the future, will need tighter control.    Her updated medication list for this problem includes:    Lisinopril 5 Mg Tabs (Lisinopril) .Marland Kitchen... Take  1 tablet by mouth once a day  Problem # 4:  HEMATOCHEZIA (ICD-578.1) Pt met with Mrs. Hill earlier today but still working on paperwork to get her orange card.  Once this goes through, can set her up with colonoscopy.    Complete Medication List: 1)  Ventolin Hfa 108 (90 Base) Mcg/act Aers (Albuterol sulfate) .... 2 puffs inhaled every 4-6 hours as needed 2)  Fish Oil 1000 Mg Caps (Omega-3  fatty acids) .... Takes 2 tablets by mouth daily 3)  Aspirin 81 Mg Tabs (Aspirin) .... Take one tablet by mouth daily 4)  Cod Liver Oil 1000 Mg Caps (Cod liver oil) 5)  Chlor-trimeton Allergy 12 Mg Cr-tabs (Chlorpheniramine maleate) .... Take two tablets by mouth dailly 6)  Vitamin B-6 250 Mg Tabs (Pyridoxine hcl) .... Take two tables by mouth daily 7)  Dramamine 50 Mg Tabs (Dimenhydrinate) .... Taking 2 tablets by mouth daily 8)  Hydrocortisone 2.5 % Oint (Hydrocortisone) .... Apply thin layer over area of itching twice a day 9)  Celexa 20 Mg Tabs (Citalopram hydrobromide) .... Take one tablet by mouth daily 10)  Synthroid 50 Mcg Tabs (Levothyroxine sodium) .... Take 1 tablet by mouth once a day 11)  Lisinopril 5 Mg Tabs (Lisinopril) .... Take 1 tablet by mouth once a day  Patient Instructions: 1)  Please schedule an appointment with your primary doctor in at next available. 2)  Please come in for bloodwork in the morning later this week.  Do not eat or drink anything prior to coming in.    Laboratory Results   Blood Tests   Date/Time Received: June 27, 2008 5:12 PM. Date/Time Reported: Maryan Rued  June 27, 2008 5:12 PM  HGBA1C: 6.1%   (Normal Range: Non-Diabetic - 3-6%   Control Diabetic - 6-8%)

## 2010-04-11 NOTE — Progress Notes (Signed)
Summary: refill/gg  Phone Note Refill Request  on June 28, 2009 11:35 AM  Refills Requested: Medication #1:  SYNTHROID 50 MCG TABS Take 1 tablet by mouth once a day  Medication #2:  LISINOPRIL 5 MG TABS Take 1 tablet by mouth once a day  Method Requested: Electronic Initial call taken by: Gevena Cotton RN,  June 28, 2009 11:35 AM    Prescriptions: LISINOPRIL 5 MG TABS (LISINOPRIL) Take 1 tablet by mouth once a day  #30 x 3   Entered and Authorized by:   Rudie Meyer MD   Signed by:   Rudie Meyer MD on 06/28/2009   Method used:   Electronically to        [facility] [phone]* (retail)       [place], [place]  [address]       Ph: [phone]       Fax: [phone]   RxID:   [id number] SYNTHROID 50 MCG TABS (LEVOTHYROXINE SODIUM) Take 1 tablet by mouth once a day  #30 x 3   Entered and Authorized by:   Rudie Meyer MD   Signed by:   Rudie Meyer MD on 06/28/2009   Method used:   Electronically to        [facility] [phone]* (retail)       [address]       [place], [place]  [address]       Ph: [phone]       Fax: [phone]   RxID:   [id number]

## 2010-04-11 NOTE — Progress Notes (Signed)
Summary: phone/gg  Phone Note Call from Patient   Caller: Patient Summary of Call: Pt called c/o stuffy nose, sneezing,  congestion in chest .  Singular not helping.  Denies fever.   onset 2 - 3 days ago.   appointment given for Monday.  If better she will call and cancel. Initial call taken by: Gevena Cotton RN,  August 17, 2009 11:03 AM

## 2010-04-11 NOTE — Assessment & Plan Note (Signed)
Summary: ACUTE/VEGA/ED F/U FOR FALL/CH   Vital Signs:  Patient profile:   50 year old female Height:      63 inches Weight:      262.4 pounds BMI:     46.65 Temp:     97.0 degrees F oral Pulse rate:   86 / minute BP sitting:   124 / 85  (right arm)  Vitals Entered By: Silverio Decamp NT II (November 21, 2009 10:04 AM) CC: NEED REFILLS, ER FOLLOWUP,  Is Patient Diabetic? No Pain Assessment Patient in pain? yes     Location: HEADACHES Intensity: 3 Type: aching Onset of pain  1 week ago Nutritional Status BMI of > 30 = obese  Have you ever been in a relationship where you felt threatened, hurt or afraid?No   Does patient need assistance? Functional Status Self care Ambulation Normal   Primary Care Provider:  Kathlen Brunswick MD  CC:  NEED REFILLS, ER FOLLOWUP, and .  History of Present Illness: Kendra Adams is a 50 year old woman with pmh significant for COPD, Tobacco abuse, hypothyroidism, HLD, GERD, Depression who presents to clinic for ED follow up.  Patient was seen in the ED for fall and headache. Pt had fall one week ago. Pt states she had walked into the kitchen and looked down and saw that floor was wet and was wearing crocs and states she slipped and fell. She states she hit head on table. Pt had CT of head and cervical spine done which showed no acute abnormality. Shortly after patient started to develop headaches s/p fall. Went to ER again, and was given pain medicine, of which she cannot remember the name. She states her headaches have improved a lot.   Patient also believes she has a UTI. Denies burning on urination or vaginal discharge. Complains of frequency and some itchiness, pain in lower abdomen.     Depression History:      The patient denies a depressed mood most of the day and a diminished interest in her usual daily activities.         Preventive Screening-Counseling & Management  Alcohol-Tobacco     Alcohol type: occasionally     Smoking  Status: quit     Smoking Cessation Counseling: yes     Packs/Day: 0.5     Year Started: smoking 33 yrs  Caffeine-Diet-Exercise     Does Patient Exercise: yes     Type of exercise: YARD WORK  Current Medications (verified): 1)  Ventolin Hfa 108 (90 Base) Mcg/act Aers (Albuterol Sulfate) .... 2 Puffs Inhaled Every 4-6 Hours As Needed 2)  Fish Oil 1360 Mg Caps (Omega-3 Fatty Acids) .... Take 1 Tablet By Mouth Once A Day 3)  Aspirin 81 Mg Tabs (Aspirin) .... Take One Tablet By Mouth Daily 4)  Cod Liver Oil 1000 Mg Caps (Cod Liver Oil) 5)  Vitamin B-6 250 Mg Tabs (Pyridoxine Hcl) .... Take Two Tables By Mouth Daily 6)  Hydrocortisone 2.5 % Oint (Hydrocortisone) .... Apply Thin Layer Over Area of Itching Twice A Day 7)  Synthroid 75 Mcg Tabs (Levothyroxine Sodium) .... Take 1 Tablet By Mouth Once A Day 8)  Lisinopril 5 Mg Tabs (Lisinopril) .... Take 1 Tablet By Mouth Once A Day 9)  Omeprazole 40 Mg Cpdr (Omeprazole) .... Take 1 Tablet By Mouth Two Times A Day 10)  Pravachol 40 Mg Tabs (Pravastatin Sodium) .... Take 1 Tablet By Mouth Once A Day 11)  Sertraline Hcl 25 Mg Tabs (Sertraline Hcl) .Marland KitchenMarland KitchenMarland Kitchen  Take 1 Tab By Mouth Every Night 12)  Advair Diskus 250-50 Mcg/dose Aepb (Fluticasone-Salmeterol) .... Take 1 Puff Two Times A Day  Allergies (verified): 1)  ! Vicodin  Past History:  Past Medical History: Last updated: 04/27/2008 Carpal Tunnel syndrome (right hand surgery May 30, 1996, left hand surgery 1997-05-30) Allergic rhinitis Asthma Depression GERD Hyperlipidemia Hypothyroidism Psoriasis   Family History: Last updated: 04/27/2008 Family History Diabetes 1st degree relative Family History Hypertension Family History of Father died of Suicide (gunshot)  in 05-30-96  Social History: Last updated: 08/20/2009 Married Current Smoker- one quarter pack per day, slowly quitting. Alcohol use-no Drug use-no  Risk Factors: Exercise: yes (11/21/2009)  Risk Factors: Smoking Status: quit  (11/21/2009) Packs/Day: 0.5 (11/21/2009)  Review of Systems      See HPI  Physical Exam  General:  alert and well-developed.   Head:  normocephalic and atraumatic.  there is a bruise located behind her right ear from fall, mild residual bleeding, no other deformity  Eyes:  vision grossly intact, pupils equal, pupils round, and pupils reactive to light.   Neck:  supple.   Lungs:  normal respiratory effort, no accessory muscle use, and normal breath sounds.   Heart:  normal rate, regular rhythm, no murmur, no gallop, and no rub.   Abdomen:  soft, non-tender, and normal bowel sounds.   Msk:  normal ROM.   Neurologic:  alert & oriented X3 and cranial nerves II-XII intact.     Impression & Recommendations:  Problem # 1:  URINARY TRACT INFECTION (ICD-599.0) Urine dipstick positive for leukocytes, no nitrites. Pt is symptomatic. Will treat with bactrim and advised patient to return to clinic if symptoms worsen.  Her updated medication list for this problem includes:    Bactrim Ds 800-160 Mg Tabs (Sulfamethoxazole-trimethoprim) .Marland Kitchen... Take 1 tablet by mouth two times a day for 3 days.  Orders: T-Culture, Urine ([id number])  Problem # 2:  OTHER FALL (ICD-E888.8) Fall in kitchen due to wet floor. No acute abnormalities or fractures evident from CT head and spine scan. There is a bruise present from fall, but no new swelling or abnormalities seen.   Problem # 3:  HYPERTENSION (ICD-401.9) Well controlled. Continue current regimen and check Bmet today.   Her updated medication list for this problem includes:    Lisinopril 5 Mg Tabs (Lisinopril) .Marland Kitchen... Take 1 tablet by mouth once a day  BP today: 124/85 Prior BP: 124/89 (08/20/2009)  Labs Reviewed: K+: 4.8 (01/17/2009) Creat: : 0.68 (01/17/2009)   Chol: 214 (01/17/2009)   HDL: 34 (01/17/2009)   LDL: 148 (01/17/2009)   TG: 162 (01/17/2009)  Orders: T-Basic Metabolic Panel ([id number])  Problem # 4:  Preventive Health Care  (ICD-V70.0) Flu shot today.   Problem # 5:  HYPOTHYROIDISM (ICD-244.9) Continue current synthroid dose. Well controlled.  Her updated medication list for this problem includes:    Synthroid 75 Mcg Tabs (Levothyroxine sodium) .Marland Kitchen... Take 1 tablet by mouth once a day  Labs Reviewed: TSH: 7.908 (07/16/2009)    HgBA1c: 6.1 (06/27/2008) Chol: 214 (01/17/2009)   HDL: 34 (01/17/2009)   LDL: 148 (01/17/2009)   TG: 162 (01/17/2009)  Complete Medication List: 1)  Ventolin Hfa 108 (90 Base) Mcg/act Aers (Albuterol sulfate) .... 2 puffs inhaled every 4-6 hours as needed 2)  Fish Oil 1360 Mg Caps (Omega-3 fatty acids) .... Take 1 tablet by mouth once a day 3)  Aspirin 81 Mg Tabs (Aspirin) .... Take one tablet by mouth daily 4)  Cod Liver Oil 1000 Mg  Caps (Cod liver oil) 5)  Vitamin B-6 250 Mg Tabs (Pyridoxine hcl) .... Take two tables by mouth daily 6)  Hydrocortisone 2.5 % Oint (Hydrocortisone) .... Apply thin layer over area of itching twice a day 7)  Synthroid 75 Mcg Tabs (Levothyroxine sodium) .... Take 1 tablet by mouth once a day 8)  Lisinopril 5 Mg Tabs (Lisinopril) .... Take 1 tablet by mouth once a day 9)  Omeprazole 40 Mg Cpdr (Omeprazole) .... Take 1 tablet by mouth two times a day 10)  Pravachol 40 Mg Tabs (Pravastatin sodium) .... Take 1 tablet by mouth once a day 11)  Sertraline Hcl 25 Mg Tabs (Sertraline hcl) .... Take 1 tab by mouth every night 12)  Advair Diskus 250-50 Mcg/dose Aepb (Fluticasone-salmeterol) .... Take 1 puff two times a day 13)  Bactrim Ds 800-160 Mg Tabs (Sulfamethoxazole-trimethoprim) .... Take 1 tablet by mouth two times a day for 3 days.  Other Orders: Influenza Vaccine NON MCR ([id number])  Patient Instructions: 1)  Follow up in 6 months or as needed. 2)  Tobacco is very bad for your health and your loved ones! You Should stop smoking!. 3)  Stop Smoking Tips: Choose a Quit date. Cut down before the Quit date. decide what you will do as a substitute when you feel  the urge to smoke(gum,toothpick,exercise). 4)  It is important that you exercise regularly at least 20 minutes 5 times a week. If you develop chest pain, have severe difficulty breathing, or feel very tired , stop exercising immediately and seek medical attention. 5)  You need to lose weight. Consider a lower calorie diet and regular exercise.  Prescriptions: BACTRIM DS 800-160 MG TABS (SULFAMETHOXAZOLE-TRIMETHOPRIM) Take 1 tablet by mouth two times a day for 3 days.  #6 x 0   Entered and Authorized by:   Jolene Provost MD   Signed by:   Jolene Provost MD on 11/21/2009   Method used:   Electronically to        [facility] [phone]* (retail)       [place], [place]  [address]       Ph: [phone]       Fax: [phone]   RxID:   [id number] SERTRALINE HCL 25 MG TABS (SERTRALINE HCL) take 1 tab by mouth every night  #30 x 3   Entered and Authorized by:   Jolene Provost MD   Signed by:   Jolene Provost MD on 11/21/2009   Method used:   Electronically to        [facility] [phone]* (retail)       [place], [place]  [address]       Ph: [phone]       Fax: [phone]   RxID:   [id number] LISINOPRIL 5 MG TABS (LISINOPRIL) Take 1 tablet by mouth once a day  #30 x 6   Entered and Authorized by:   Jolene Provost MD   Signed by:   Jolene Provost MD on 11/21/2009   Method used:   Electronically to        [facility] [phone]* (retail)       [address]       [place], [place]  [address]       Ph: [phone]       Fax: [phone]   RxID:   [id number]   [place]  Influenza vaccine: Fluvax Non-MCR  (11/21/2009)   Influenza vaccine deferral: Deferred  (11/01/2008)    Tetanus booster: Not documented   Td booster deferral: Deferred  (11/01/2008)    Pneumococcal vaccine: Not documented  Colorectal Screening   Hemoccult: Not documented   Hemoccult action/deferral: Deferred   (05/29/2009)    Colonoscopy: Not documented   Colonoscopy action/deferral: Refused  (08/20/2009)  Other Screening   Pap smear: UNSATISFACTORY FOR EVALUATION.  THE SPECIMEN IS PROCESSED  (07/25/2008)    Mammogram: ASSESSMENT: Negative - BI-RADS 1^MS DIGITAL SCREENING  (12/13/2008)   Mammogram action/deferral: Ordered  (10/18/2008)   Smoking status: quit  (11/21/2009)  Lipids   Total Cholesterol: 214  (01/17/2009)   Lipid panel action/deferral: Lipid Panel ordered   LDL: 148  (01/17/2009)   LDL Direct: Not documented   HDL: 34  (01/17/2009)   Triglycerides: 162  (01/17/2009)    SGOT (AST): 24  (01/17/2009)   BMP action: Ordered   SGPT (ALT): 30  (01/17/2009)   Alkaline phosphatase: 107  (01/17/2009)   Total bilirubin: 0.3  (01/17/2009)    Lipid flowsheet reviewed?: Yes   Progress toward LDL goal: Unchanged  Hypertension   Last Blood Pressure: 124 / 85  (11/21/2009)   Serum creatinine: 0.68  (01/17/2009)   BMP action: Ordered   Serum potassium 4.8  (01/17/2009)    Hypertension flowsheet reviewed?: Yes   Progress toward BP goal: At goal  Self-Management Support :   Personal Goals (by the next clinic visit) :      Personal blood pressure goal: 140/90  (11/21/2009)     Personal LDL goal: 130  (11/28/2008)    Patient will work on the following items until the next clinic visit to reach self-care goals:     Medications and monitoring: take my medicines every day  (11/21/2009)     Eating: drink diet soda or water instead of juice or soda, eat more vegetables, use fresh or frozen vegetables, eat foods that are low in salt, eat baked foods instead of fried foods, eat fruit for snacks and desserts  (11/21/2009)     Activity: take a 30 minute walk every day  (11/21/2009)     Other: yard work  (01/18/2009)    Hypertension self-management support: Water quality scientist, Resources for patients handout, Written self-care plan  (11/21/2009)   Hypertension self-care plan printed.    Hypertension education handout printed    Lipid self-management support: Education handout, Resources for patients handout  (11/21/2009)     Lipid education handout printed      Resource handout printed.   Nursing Instructions: Give Flu vaccine today    Process Orders Check Orders Results:     [facility]: ABN not required for this insurance Tests Sent for requisitioning (November 21, 2009 11:12 AM):     11/21/2009: [facility] -- T-Culture, Urine [[id number]] (signed)     11/21/2009: [facility] -- T-Basic Metabolic Panel [[id number]] (signed)      Influenza Vaccine    Vaccine Type: Fluvax Non-MCR    Site: right deltoid    Mfr: [employer]    Dose: 0.5 ml    Route: IM    Given by: Hilda Blades Ditzler RN    Exp. Date: 09/07/2010    Lot #: [id number]    VIS given: 10/02/09 version given November 21, 2009.  Flu Vaccine Consent Questions    Do you have a history of severe allergic reactions to this vaccine? no    Any prior history  of allergic reactions to egg and/or gelatin? no    Do you have a sensitivity to the preservative Thimersol? no    Do you have a past history of Guillan-Barre Syndrome? no    Do you currently have an acute febrile illness? no    Have you ever had a severe reaction to latex? no    Vaccine information given and explained to patient? yes    Are you currently pregnant? no

## 2010-04-11 NOTE — Assessment & Plan Note (Signed)
Summary: asthma, whispering/pcp-vega/hla   Vital Signs:  Patient profile:   50 year old female Height:      63 inches (160.02 cm) Weight:      264.9 pounds (120.41 kg) BMI:     47.09 O2 Sat:      94 % on Room air Temp:     97.8 degrees F (36.56 degrees C) oral Pulse rate:   98 / minute BP sitting:   118 / 71  (left arm)  Vitals Entered By: Hilda Blades Ditzler RN (January 29, 2009 11:02 AM)  O2 Flow:  Room air Is Patient Diabetic? No Pain Assessment Patient in pain? yes     Location: throat Intensity: 7 Onset of pain  past week Nutritional Status BMI of > 30 = obese Nutritional Status Detail appetite fine  Have you ever been in a relationship where you felt threatened, hurt or afraid?denies   Does patient need assistance? Functional Status Self care Ambulation Normal Comments Problems with asthma - went to ER last week - finish with Prednisone. Voice comes  and goes. Has ins for Singulair.   Primary Care Provider:  Kathlen Brunswick MD   History of Present Illness: 50 year old Past Medical History: Carpal Tunnel syndrome (right hand surgery 1998, left hand surgery 1999) Allergic rhinitis Asthma Depression GERD Hyperlipidemia Hypothyroidism Psoriasis   She present to ED last  thursday and she was given prednisone, breathing treatment. She finished prednisone this morning. She is feeling shortness of breath since last night. She has been using albuterol  3 times in the day. She is having dry cough. She denies fever.  She is current smoker, she hasnt smoked for last 2 days. I encourage her to quit smoking.  Depression History:      The patient denies a depressed mood most of the day and a diminished interest in her usual daily activities.         Preventive Screening-Counseling & Management  Alcohol-Tobacco     Alcohol type: occasionally     Smoking Status: quit     Smoking Cessation Counseling: yes     Packs/Day: 0.5     Year Started: smoking 33 yrs   Caffeine-Diet-Exercise     Does Patient Exercise: yes     Type of exercise: YARD WORK  Current Medications (verified): 1)  Ventolin Hfa 108 (90 Base) Mcg/act Aers (Albuterol Sulfate) .... 2 Puffs Inhaled Every 4-6 Hours As Needed 2)  Fish Oil 1360 Mg Caps (Omega-3 Fatty Acids) .... Take 1 Tablet By Mouth Once A Day 3)  Aspirin 81 Mg Tabs (Aspirin) .... Take One Tablet By Mouth Daily 4)  Cod Liver Oil 1000 Mg Caps (Cod Liver Oil) 5)  Chlor-Trimeton Allergy 12 Mg Cr-Tabs (Chlorpheniramine Maleate) .... Take Two Tablets By Mouth Dailly 6)  Vitamin B-6 250 Mg Tabs (Pyridoxine Hcl) .... Take Two Tables By Mouth Daily 7)  Hydrocortisone 2.5 % Oint (Hydrocortisone) .... Apply Thin Layer Over Area of Itching Twice A Day 8)  Synthroid 50 Mcg Tabs (Levothyroxine Sodium) .... Take 1 Tablet By Mouth Once A Day 9)  Lisinopril 5 Mg Tabs (Lisinopril) .... Take 1 Tablet By Mouth Once A Day 10)  Amitriptyline Hcl 25 Mg Tabs (Amitriptyline Hcl) .... Take 1 Tablet By Mouth At Bedtime For One Week Then Take 2 Tablets By Mouth At Bedtime 11)  Omeprazole 20 Mg Tbec (Omeprazole) .... Take 1 Tablet By Mouth Once A Day 12)  Pravachol 40 Mg Tabs (Pravastatin Sodium) .... Take 1 Tablet  By Mouth Once A Day 13)  Singulair 10 Mg Tabs (Montelukast Sodium) .... Take 1 Tablet By Mouth Once A Day 14)  Voltaren 1 % Gel (Diclofenac Sodium) .... Apply 4g of Gel To Affected Area 4 Times A Daily  Allergies: 1)  ! Vicodin  Social History: Smoking Status:  quit  Review of Systems  The patient denies fever, chest pain, syncope, peripheral edema, headaches, hemoptysis, abdominal pain, and melena.    Physical Exam  General:  alert and well-developed.  Speaking full sentences. Head:  normocephalic and atraumatic.   Eyes:  vision grossly intact.   Lungs:  normal respiratory effort, no intercostal retractions, and no accessory muscle use.  Mild wheezes mild ronchy. Heart:  normal rate and regular rhythm.   Abdomen:  soft,  non-tender, normal bowel sounds, and no distention.   Extremities:  no edema.   Impression & Recommendations:  Problem # 1:  ASTHMA (ICD-493.90) She present with symptoms asthma exacerbation, she is only using albutero 3 times a day as needed. she was given 4 days of prednisone. I will give her 3 more days. I advised her to use albutero every 4 hours, schedule for 2-3 days. She can now afford singular, i will give refill. She was speaking in full sentences, Sat at 94 room air.  Her updated medication list for this problem includes:    Ventolin Hfa 108 (90 Base) Mcg/act Aers (Albuterol sulfate) .Marland Kitchen... 2 puffs inhaled every 4-6 hours as needed    Singulair 10 Mg Tabs (Montelukast sodium) .Marland Kitchen... Take 1 tablet by mouth once a day    Prednisone 10 Mg Tabs (Prednisone) .Marland Kitchen... Take 4 tablet for 2 days then 3 tablets for 1 day the 2 tablets for 1 day the 1 tablet for 1 day.  Problem # 2:  TOBACCO ABUSE (ICD-305.1) Counseling was provied. I stress the importance in quiting smoking for her asthma controlled.  Complete Medication List: 1)  Ventolin Hfa 108 (90 Base) Mcg/act Aers (Albuterol sulfate) .... 2 puffs inhaled every 4-6 hours as needed 2)  Fish Oil 1360 Mg Caps (Omega-3 fatty acids) .... Take 1 tablet by mouth once a day 3)  Aspirin 81 Mg Tabs (Aspirin) .... Take one tablet by mouth daily 4)  Cod Liver Oil 1000 Mg Caps (Cod liver oil) 5)  Chlor-trimeton Allergy 12 Mg Cr-tabs (Chlorpheniramine maleate) .... Take two tablets by mouth dailly 6)  Vitamin B-6 250 Mg Tabs (Pyridoxine hcl) .... Take two tables by mouth daily 7)  Hydrocortisone 2.5 % Oint (Hydrocortisone) .... Apply thin layer over area of itching twice a day 8)  Synthroid 50 Mcg Tabs (Levothyroxine sodium) .... Take 1 tablet by mouth once a day 9)  Lisinopril 5 Mg Tabs (Lisinopril) .... Take 1 tablet by mouth once a day 10)  Amitriptyline Hcl 25 Mg Tabs (Amitriptyline hcl) .... Take 1 tablet by mouth at bedtime for one week then take 2  tablets by mouth at bedtime 11)  Omeprazole 20 Mg Tbec (Omeprazole) .... Take 1 tablet by mouth once a day 12)  Pravachol 40 Mg Tabs (Pravastatin sodium) .... Take 1 tablet by mouth once a day 13)  Singulair 10 Mg Tabs (Montelukast sodium) .... Take 1 tablet by mouth once a day 14)  Voltaren 1 % Gel (Diclofenac sodium) .... Apply 4g of gel to affected area 4 times a daily 15)  Prednisone 10 Mg Tabs (Prednisone) .... Take 4 tablet for 2 days then 3 tablets for 1 day the 2 tablets  for 1 day the 1 tablet for 1 day.  Patient Instructions: 1)  I will give you couple more days of prednisone. 2)  Use inhale every for 4 hour, for 2-3 days then as needed. 3)  keep your prior appointment. Prescriptions: PREDNISONE 10 MG TABS (PREDNISONE) Take 4 tablet for 2 days then 3 tablets for 1 day the 2 tablets for 1 day the 1 tablet for 1 day.  #14 x 0   Entered and Authorized by:   Niel Hummer MD   Signed by:   Niel Hummer MD on 01/29/2009   Method used:   Print then Give to Patient   RxID:   [id number] SINGULAIR 10 MG TABS (MONTELUKAST SODIUM) Take 1 tablet by mouth once a day  #30 x 6   Entered and Authorized by:   Niel Hummer MD   Signed by:   Niel Hummer MD on 01/29/2009   Method used:   Print then Give to Patient   RxID:   [id number] VENTOLIN HFA 108 (90 BASE) MCG/ACT AERS (ALBUTEROL SULFATE) 2 puffs inhaled every 4-6 hours as needed  #1 x 6   Entered and Authorized by:   Niel Hummer MD   Signed by:   Niel Hummer MD on 01/29/2009   Method used:   Print then Give to Patient   RxID:   [id number]

## 2010-04-11 NOTE — Progress Notes (Signed)
Summary: refill/ hla  Phone Note Refill Request Message from:  Patient on January 19, 2009 11:01 AM  Refills Requested: Medication #1:  SYNTHROID 50 MCG TABS Take 1 tablet by mouth once a day Initial call taken by: Freddy Finner RN,  January 19, 2009 11:01 AM    Prescriptions: SYNTHROID 50 MCG TABS (LEVOTHYROXINE SODIUM) Take 1 tablet by mouth once a day  #30 x 3   Entered and Authorized by:   Rudie Meyer MD   Signed by:   Rudie Meyer MD on 01/19/2009   Method used:   Electronically to        [facility] [phone]* (retail)       [address]       [place], [place]  [address]       Ph: [phone]       Fax: [phone]   RxID:   [id number]
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
## 2010-04-11 NOTE — Assessment & Plan Note (Signed)
Summary: ACUTE-RIGHT SHOULDER PAIN/CFB(VEGA)   Vital Signs:  Patient profile:   50 year old female Height:      63 inches (160.02 cm) Weight:      253.4 pounds (115.18 kg) BMI:     45.05 Temp:     97.3 degrees F (36.28 degrees C) oral Pulse rate:   101 / minute BP sitting:   128 / 90  (left arm)  Vitals Entered By: Hilda Blades Ditzler RN (February 13, 2010 9:51 AM) Is Patient Diabetic? No Pain Assessment Patient in pain? yes     Location: all over Intensity: 8-9 Type: sharp Onset of pain  past month Nutritional Status BMI of > 30 = obese Nutritional Status Detail appetite down  Have you ever been in a relationship where you felt threatened, hurt or afraid?denies   Does patient need assistance? Functional Status Self care Ambulation Normal Comments Hot flashes - LMP years. Dizziness, back pain, shooting pains in head, more h/a, confusion, Tramadol makes her nausea, Refill Pravastatin and right shoulder pain 2-4 weeks.   Primary Care Provider:  Kathlen Brunswick MD   History of Present Illness: 50 yo woman with PMH of COPD, HTN, HLP, hypothyroidism, depression, asthma, pain in joint presents today for cortisone shot because of sharp right shoulder pain x few years and Dr. Wendee Beavers gave her a shot over the summer which relieved her pain for about 4 months.  She had 2 bone spurs and had been operated in 1998 but she reinjured her shoulder.  She has not been able to see an orthopedist because she does not have insurance.  She has been taking 2 aleve two times a day for pain.  Tramadol makes her nauseous.    She has been experience dizziness, even when she is in bed and moving her head she feels the room is moving/spinning x 2 days.        Depression History:      The patient denies a depressed mood most of the day and a diminished interest in her usual daily activities.         Preventive Screening-Counseling & Management  Alcohol-Tobacco     Alcohol type: occasionally  Smoking Status: quit     Smoking Cessation Counseling: yes     Packs/Day: 0.5     Year Started: smoking 33 yrs  Caffeine-Diet-Exercise     Does Patient Exercise: yes     Type of exercise: YARD WORK  Allergies: 1)  ! Vicodin  Social History: Smoking Status:  quit  Review of Systems       The patient complains of dyspnea on exertion.    Physical Exam  General:  alert, well-developed, well-nourished, and well-hydrated.   Lungs:  normal respiratory effort, no intercostal retractions, no accessory muscle use, normal breath sounds, no crackles, and no wheezes.   Heart:  normal rate, regular rhythm, no murmur, no gallop, and no JVD.   Abdomen:  soft, non-tender, normal bowel sounds, no distention, no masses, and no rebound tenderness.   Msk:  Full ROM on left shoulder. Mild limited ROM on right shoulder.   Normal internal ROM of left and right upper extremities.    Pulses:  R and L carotid,radial,femoral,dorsalis pedis and posterior tibial pulses are full and equal bilaterally Extremities:  No clubbing, cyanosis, edema, or deformity noted with normal full range of motion of all joints.   Neurologic:  alert & oriented X3, cranial nerves II-XII intact, strength normal in all extremities, and sensation intact  to light touch.     Impression & Recommendations:  Problem # 1:  PAIN IN JOINT, SHOULDER REGION (ICD-719.41) I reviewed her right shoulder Xray performed on 07/2009: no fracture/dislocation, but there is degenerative change on humeral head.  Patient reported that steroid injection help relieve pain x 4 months.  She does not want to continue taking Tramadol because it makes her nauseous, and she cannot take NSAIDs secondary to her GI issues.   Injection?  The following medications were removed from the medication list:    Tramadol Hcl 50 Mg Tabs (Tramadol hcl) .Marland Kitchen... Take 1 tab every 4 hours as needed for pain Her updated medication list for this problem includes:    Aspirin 81 Mg  Tabs (Aspirin) .Marland Kitchen... Take one tablet by mouth daily  Orders: Sports Medicine (Sports Med)  Problem # 2:  DIZZINESS (ICD-780.4) Most likely Benign Paroxysmal Positional Vertigo.  Patient has similar episodes in the past and resolved on its own.  Her dizziness is provoked with certain position/movement of her head. I explained to patient about BPPV and she admitted having that diagnosis in the past and received exercise techniques which helped with her dizziness.  She will try the same techniques again and does not think she needs to take antivert medication at this time.  Problem # 3:  HYPERTENSION (ICD-401.9)  Well-controlled. BP 128/90.  Will continue current medication regimen Her updated medication list for this problem includes:    Lisinopril 5 Mg Tabs (Lisinopril) .Marland Kitchen... Take 1 tablet by mouth once a day  Orders: T-CMP with Estimated GFR ([id number])  Problem # 4:  HYPOTHYROIDISM (ICD-244.9)  Continue Synthroid 106m by mouth once daily.  Last TSH was 7.908 in 07/2009.  Will recheck TSH level today.   Her updated medication list for this problem includes:    Synthroid 75 Mcg Tabs (Levothyroxine sodium) ..Marland Kitchen.. Take 1 tablet by mouth once a day  Orders: T-CMP with Estimated GFR ([id number] T-TSH (([id number]  Problem # 5:  HYPERLIPIDEMIA (ICD-272.4) Last Lipid Panel was 01/2009.  LDL 148, Total chol 214.  WIll recheck lipid panel and refill Pravachol 468mby mouth at bedtime.   Follow up with PCP in 2-4 weeks.  Her updated medication list for this problem includes:    Pravachol 40 Mg Tabs (Pravastatin sodium) ...Marland Kitchen. Take 1 tablet by mouth once a day  Orders: T-Lipid Profile ([id number] Complete Medication List: 1)  Ventolin Hfa 108 (90 Base) Mcg/act Aers (Albuterol sulfate) .... 2 puffs inhaled every 4-6 hours as needed 2)  Fish Oil 1360 Mg Caps (Omega-3 fatty acids) .... Take 1 tablet by mouth once a day 3)  Aspirin 81 Mg Tabs (Aspirin) .... Take one tablet by mouth  daily 4)  Cod Liver Oil 1000 Mg Caps (Cod liver oil) 5)  Vitamin B-6 250 Mg Tabs (Pyridoxine hcl) .... Take two tables by mouth daily 6)  Hydrocortisone 2.5 % Oint (Hydrocortisone) .... Apply thin layer over area of itching twice a day 7)  Synthroid 75 Mcg Tabs (Levothyroxine sodium) .... Take 1 tablet by mouth once a day 8)  Lisinopril 5 Mg Tabs (Lisinopril) .... Take 1 tablet by mouth once a day 9)  Omeprazole 40 Mg Cpdr (Omeprazole) .... Take 1 tablet by mouth two times a day 10)  Pravachol 40 Mg Tabs (Pravastatin sodium) .... Take 1 tablet by mouth once a day 11)  Sertraline Hcl 25 Mg Tabs (Sertraline hcl) .... Take 1 tab by mouth every night 12)  Advair Diskus 250-50  Mcg/dose Aepb (Fluticasone-salmeterol) .... Take 1 puff two times a day  Patient Instructions: 1)  Will get labworks today and I will call you for results 2)  Will refer you to sport medicine for right shoulder pain: physical therapy vs. injection 3)  Continue to take current medication 4)  Follow up with PCP in 2-3 weeks Prescriptions: PRAVACHOL 40 MG TABS (PRAVASTATIN SODIUM) Take 1 tablet by mouth once a day  #30 x 6   Entered and Authorized by:   Marcelle Smiling MD   Signed by:   Marcelle Smiling MD on 02/13/2010   Method used:   Electronically to        [facility] [phone]* (retail)       [address]       [place], [place]  [address]       Ph: [phone]       Fax: [phone]   RxID:   [id number]    Orders Added: 1)  T-CMP with Estimated GFR [[id number]] 2)  T-Lipid Profile [[id number]] 3)  T-TSH [[id number]] 4)  Sports Medicine [Sports Med] 5)  Est. Patient Level IV [73428]   Process Orders Check Orders Results:     [facility]: ABN not required for this insurance Tests Sent for requisitioning (February 13, 2010 10:54 AM):     02/13/2010: [facility] -- T-CMP with Estimated GFR [[id number]] (signed)     02/13/2010: [facility] -- T-Lipid  Profile [phone] (signed)     02/13/2010: [facility] -- T-TSH [phone] (signed)     Prevention & Chronic Care Immunizations   Influenza vaccine: Fluvax Non-MCR  (11/21/2009)   Influenza vaccine deferral: Deferred  (11/01/2008)    Tetanus booster: Not documented   Td booster deferral: Deferred  (11/01/2008)    Pneumococcal vaccine: Not documented  Colorectal Screening   Hemoccult: Not documented   Hemoccult action/deferral: Deferred  (05/29/2009)    Colonoscopy: Not documented   Colonoscopy action/deferral: GI referral  (11/28/2009)  Other Screening   Pap smear: UNSATISFACTORY FOR EVALUATION.  THE SPECIMEN IS PROCESSED  (07/25/2008)    Mammogram: ASSESSMENT: Negative - BI-RADS 1^MS DIGITAL SCREENING  (12/13/2008)   Mammogram action/deferral: Ordered  (10/18/2008)   Smoking status: quit  (02/13/2010)  Lipids   Total Cholesterol: 214  (01/17/2009)   Lipid panel action/deferral: Lipid Panel ordered   LDL: 148  (01/17/2009)   LDL Direct: Not documented   HDL: 34  (01/17/2009)   Triglycerides: 162  (01/17/2009)    SGOT (AST): 24  (01/17/2009)   BMP action: Ordered   SGPT (ALT): 30  (01/17/2009)   Alkaline phosphatase: 107  (01/17/2009)   Total bilirubin: 0.3  (01/17/2009)  Hypertension   Last Blood Pressure: 128 / 90  (02/13/2010)   Serum creatinine: 0.83  (11/21/2009)   BMP action: Ordered   Serum potassium 4.2  (11/21/2009)  Self-Management Support :   Personal Goals (by the next clinic visit) :      Personal blood pressure goal: 140/90  (11/21/2009)     Personal LDL goal: 130  (11/28/2008)    Patient will work on the following items until the next clinic visit to reach self-care goals:     Medications and monitoring: take my medicines every day, bring all of my medications to every visit  (02/13/2010)     Eating: eat more vegetables, use fresh or frozen vegetables, eat foods that are low in salt, eat fruit for snacks and desserts,  limit or avoid  alcohol  (02/13/2010)     Activity: take a 30 minute walk every day  (02/13/2010)     Other: yard work  (01/18/2009)    Hypertension self-management support: Written self-care plan, Education handout, Resources for patients handout  (02/13/2010)   Hypertension self-care plan printed.   Hypertension education handout printed    Lipid self-management support: Written self-care plan, Education handout, Resources for patients handout  (02/13/2010)   Lipid self-care plan printed.   Lipid education handout printed      Resource handout printed.

## 2010-04-11 NOTE — Miscellaneous (Signed)
Summary: HIPPA  HIPPA   Imported By: Susette Racer 04/27/2008 16:41:51  _____________________________________________________________________  External Attachment:    Type:   Image     Comment:   External Document

## 2010-04-11 NOTE — Assessment & Plan Note (Signed)
Summary: F/U VISIT/CH   Vital Signs:  Patient profile:   50 year old female Height:      63 inches (160.02 cm) Weight:      263.6 pounds (119.82 kg) BMI:     46.86 O2 Sat:      96 % on Room air Temp:     97.0 degrees F (36.11 degrees C) oral Pulse rate:   108 / minute BP sitting:   120 / 97  (left arm)  Vitals Entered By: Hilda Blades Ditzler RN (January 31, 2009 2:27 PM)  O2 Flow:  Room air Is Patient Diabetic? No Pain Assessment Patient in pain? yes     Location: throat Intensity: 7 Onset of pain  no change Nutritional Status BMI of > 30 = obese Nutritional Status Detail appetite ok  Have you ever been in a relationship where you felt threatened, hurt or afraid?denies   Does patient need assistance? Functional Status Self care Ambulation Normal Comments Breathing better - no change with throat.      Primary Care Provider:  Kathlen Brunswick MD   History of Present Illness: 50 year old with Past Medical History: Carpal Tunnel syndrome (right hand surgery 1998, left hand surgery 1999) Allergic rhinitis Asthma Depression GERD Hyperlipidemia Hypothyroidism Psoriasis  I saw patient two days ago for asthma exacerbation. She call today saying that she is having sorethroat, and guafenesin is not helping with the cough. She relates that her dyspnea is much better. She denies fever. Able to swallow well.    Depression History:      The patient denies a depressed mood most of the day and a diminished interest in her usual daily activities.         Preventive Screening-Counseling & Management  Alcohol-Tobacco     Alcohol type: occasionally     Smoking Status: quit     Smoking Cessation Counseling: yes     Packs/Day: 0.5     Year Started: smoking 33 yrs  Caffeine-Diet-Exercise     Does Patient Exercise: yes     Type of exercise: YARD WORK  Current Medications (verified): 1)  Ventolin Hfa 108 (90 Base) Mcg/act Aers (Albuterol Sulfate) .... 2 Puffs Inhaled  Every 4-6 Hours As Needed 2)  Fish Oil 1360 Mg Caps (Omega-3 Fatty Acids) .... Take 1 Tablet By Mouth Once A Day 3)  Aspirin 81 Mg Tabs (Aspirin) .... Take One Tablet By Mouth Daily 4)  Cod Liver Oil 1000 Mg Caps (Cod Liver Oil) 5)  Chlor-Trimeton Allergy 12 Mg Cr-Tabs (Chlorpheniramine Maleate) .... Take Two Tablets By Mouth Dailly 6)  Vitamin B-6 250 Mg Tabs (Pyridoxine Hcl) .... Take Two Tables By Mouth Daily 7)  Hydrocortisone 2.5 % Oint (Hydrocortisone) .... Apply Thin Layer Over Area of Itching Twice A Day 8)  Synthroid 50 Mcg Tabs (Levothyroxine Sodium) .... Take 1 Tablet By Mouth Once A Day 9)  Lisinopril 5 Mg Tabs (Lisinopril) .... Take 1 Tablet By Mouth Once A Day 10)  Amitriptyline Hcl 25 Mg Tabs (Amitriptyline Hcl) .... Take 1 Tablet By Mouth At Bedtime For One Week Then Take 2 Tablets By Mouth At Bedtime 11)  Omeprazole 20 Mg Tbec (Omeprazole) .... Take 1 Tablet By Mouth Once A Day 12)  Pravachol 40 Mg Tabs (Pravastatin Sodium) .... Take 1 Tablet By Mouth Once A Day 13)  Singulair 10 Mg Tabs (Montelukast Sodium) .... Take 1 Tablet By Mouth Once A Day 14)  Voltaren 1 % Gel (Diclofenac Sodium) .... Apply 4g of  Gel To Affected Area 4 Times A Daily 15)  Prednisone 10 Mg Tabs (Prednisone) .... Take 4 Tablet For 2 Days Then 3 Tablets For 1 Day The 2 Tablets For 1 Day The 1 Tablet For 1 Day. 16)  Guiatuss Ac 100-10 Mg/52m Syrp (Guaifenesin-Codeine) .... Take 551mThree Times  A Day For Cough As Needed.  Allergies: 1)  ! Vicodin  Physical Exam  General:  alert, well-developed, and well-nourished.   Mouth:  pharyngeal erythema.  no plaque no whitish secretion. Lungs:  normal respiratory effort, no intercostal retractions, no accessory muscle use, and normal breath sounds.   Heart:  normal rate and regular rhythm.     Impression & Recommendations:  Problem # 1:  ASTHMA (ICD-493.90) Her athma exacerbation has improved. She is having sore throat. I will give her augmentin for 7 days. I  will give guafenesin plus codein for cough. No sign of candidiasis on physical exam. Her updated medication list for this problem includes:    Ventolin Hfa 108 (90 Base) Mcg/act Aers (Albuterol sulfate) ...Marland Kitchen. 2 puffs inhaled every 4-6 hours as needed    Singulair 10 Mg Tabs (Montelukast sodium) ...Marland Kitchen. Take 1 tablet by mouth once a day    Prednisone 10 Mg Tabs (Prednisone) ...Marland Kitchen. Take 4 tablet for 2 days then 3 tablets for 1 day the 2 tablets for 1 day the 1 tablet for 1 day.  Complete Medication List: 1)  Ventolin Hfa 108 (90 Base) Mcg/act Aers (Albuterol sulfate) .... 2 puffs inhaled every 4-6 hours as needed 2)  Fish Oil 1360 Mg Caps (Omega-3 fatty acids) .... Take 1 tablet by mouth once a day 3)  Aspirin 81 Mg Tabs (Aspirin) .... Take one tablet by mouth daily 4)  Cod Liver Oil 1000 Mg Caps (Cod liver oil) 5)  Chlor-trimeton Allergy 12 Mg Cr-tabs (Chlorpheniramine maleate) .... Take two tablets by mouth dailly 6)  Vitamin B-6 250 Mg Tabs (Pyridoxine hcl) .... Take two tables by mouth daily 7)  Hydrocortisone 2.5 % Oint (Hydrocortisone) .... Apply thin layer over area of itching twice a day 8)  Synthroid 50 Mcg Tabs (Levothyroxine sodium) .... Take 1 tablet by mouth once a day 9)  Lisinopril 5 Mg Tabs (Lisinopril) .... Take 1 tablet by mouth once a day 10)  Amitriptyline Hcl 25 Mg Tabs (Amitriptyline hcl) .... Take 1 tablet by mouth at bedtime for one week then take 2 tablets by mouth at bedtime 11)  Omeprazole 20 Mg Tbec (Omeprazole) .... Take 1 tablet by mouth once a day 12)  Pravachol 40 Mg Tabs (Pravastatin sodium) .... Take 1 tablet by mouth once a day 13)  Singulair 10 Mg Tabs (Montelukast sodium) .... Take 1 tablet by mouth once a day 14)  Voltaren 1 % Gel (Diclofenac sodium) .... Apply 4g of gel to affected area 4 times a daily 15)  Prednisone 10 Mg Tabs (Prednisone) .... Take 4 tablet for 2 days then 3 tablets for 1 day the 2 tablets for 1 day the 1 tablet for 1 day. 16)  Guiatuss Ac  100-10 Mg/58m41myrp (Guaifenesin-codeine) .... Take 58ml103mree times  a day for cough as needed.

## 2010-04-11 NOTE — Assessment & Plan Note (Signed)
Summary: EST-CK-ASTHMA/BRONCHITIS/CFB   Vital Signs:  Patient profile:   50 year old female Height:      63 inches (160.02 cm) Weight:      258.6 pounds (117.32 kg) BMI:     45.89 Temp:     97.0 degrees F (36.11 degrees C) oral Pulse rate:   90 / minute BP sitting:   128 / 93  (right arm) Cuff size:   large  Vitals Entered By: Lucky Rathke NT II (November 28, 2008 3:29 PM) CC: ASTHMA  / BROCHITIS  /  FLU SHOT DONE  / MEDICATION REFILL  / , Depression Is Patient Diabetic? No Pain Assessment Patient in pain? yes     Location: RIGHT HIP Intensity:      11 Type: DULL AT TIMES Onset of pain  FOR THE PAST YEAR , PAIN HAS GOTTEN WORSE IN THE RIGHT HIP JOINT  Have you ever been in a relationship where you felt threatened, hurt or afraid?No   Does patient need assistance? Functional Status Self care Ambulation Normal Comments ASTHMA /  BRONCHITIS / FLU SHOT DONE / MEDICATION REFILL   Primary Care Provider:  Kathlen Brunswick MD  CC:  ASTHMA  / BROCHITIS  /  FLU SHOT DONE  / MEDICATION REFILL  /  and Depression.  History of Present Illness: 50 yr old woman with pmhx as described below comes to the clinicPatient has been doing well with her Asthma especially since starting Singulair which she was given by a friend of hers.   Patient complains of right lateral Hip pain that has gotten worse in the last year. Described as nagging pain. Intensity of pain is 11/10. Patient experiences pain when she closes legs. Patient tried ibuprofen, goody powder, "pain pill" has not helped pain.   Patient hasn't scheduled appointment with Gynecologist.   Depression History:      The patient denies a depressed mood most of the day but notes a diminished interest in her usual daily activities.         Preventive Screening-Counseling & Management  Alcohol-Tobacco     Alcohol type: occasionally     Smoking Status: current     Smoking Cessation Counseling: yes     Packs/Day: 0.5     Year  Started: smoking 33 yrs  Caffeine-Diet-Exercise     Does Patient Exercise: yes     Type of exercise: YARD WORK  Problems Prior to Update: 1)  Hip Pain, Right  (ICD-719.45) 2)  Pain in Joint, Shoulder Region  (ICD-719.41) 3)  Postmenopausal Bleeding  (ICD-627.1) 4)  Microscopic Hematuria  (ICD-599.72) 5)  Hematochezia  (ICD-578.1) 6)  Other Nonspecific Finding Examination of Urine  (ICD-791.9) 7)  Tobacco Abuse  (ICD-305.1) 8)  Hypertension  (ICD-401.9) 9)  Skin Rash  (ICD-782.1) 10)  Family History Diabetes 1st Degree Relative  (ICD-V18.0) 11)  Hypothyroidism  (ICD-244.9) 12)  Hyperlipidemia  (ICD-272.4) 13)  Gerd  (ICD-530.81) 14)  Depression  (ICD-311) 15)  Asthma  (ICD-493.90) 16)  Allergic Rhinitis  (ICD-477.9)  Medications Prior to Update: 1)  Ventolin Hfa 108 (90 Base) Mcg/act Aers (Albuterol Sulfate) .... 2 Puffs Inhaled Every 4-6 Hours As Needed 2)  Fish Oil 1360 Mg Caps (Omega-3 Fatty Acids) .... Take 1 Tablet By Mouth Once A Day 3)  Aspirin 81 Mg Tabs (Aspirin) .... Take One Tablet By Mouth Daily 4)  Cod Liver Oil 1000 Mg Caps (Cod Liver Oil) 5)  Chlor-Trimeton Allergy 12 Mg Cr-Tabs (Chlorpheniramine Maleate) .... Take Two  Tablets By Mouth Dailly 6)  Vitamin B-6 250 Mg Tabs (Pyridoxine Hcl) .... Take Two Tables By Mouth Daily 7)  Hydrocortisone 2.5 % Oint (Hydrocortisone) .... Apply Thin Layer Over Area of Itching Twice A Day 8)  Synthroid 50 Mcg Tabs (Levothyroxine Sodium) .... Take 1 Tablet By Mouth Once A Day 9)  Lisinopril 5 Mg Tabs (Lisinopril) .... Take 1 Tablet By Mouth Once A Day 10)  Amitriptyline Hcl 25 Mg Tabs (Amitriptyline Hcl) .... Take 1 Tablet By Mouth At Bedtime For One Week Then Take 2 Tablets By Mouth At Bedtime 11)  Omeprazole 20 Mg Tbec (Omeprazole) .... Take 1 Tablet By Mouth Once A Day 12)  Pravastatin Sodium 20 Mg Tabs (Pravastatin Sodium) .... Take 1 Tablet By Mouth Once A Day 13)  Doxycycline Hyclate 100 Mg Caps (Doxycycline Hyclate) .... Two  Times A Day  Current Medications (verified): 1)  Ventolin Hfa 108 (90 Base) Mcg/act Aers (Albuterol Sulfate) .... 2 Puffs Inhaled Every 4-6 Hours As Needed 2)  Fish Oil 1360 Mg Caps (Omega-3 Fatty Acids) .... Take 1 Tablet By Mouth Once A Day 3)  Aspirin 81 Mg Tabs (Aspirin) .... Take One Tablet By Mouth Daily 4)  Cod Liver Oil 1000 Mg Caps (Cod Liver Oil) 5)  Chlor-Trimeton Allergy 12 Mg Cr-Tabs (Chlorpheniramine Maleate) .... Take Two Tablets By Mouth Dailly 6)  Vitamin B-6 250 Mg Tabs (Pyridoxine Hcl) .... Take Two Tables By Mouth Daily 7)  Hydrocortisone 2.5 % Oint (Hydrocortisone) .... Apply Thin Layer Over Area of Itching Twice A Day 8)  Synthroid 50 Mcg Tabs (Levothyroxine Sodium) .... Take 1 Tablet By Mouth Once A Day 9)  Lisinopril 5 Mg Tabs (Lisinopril) .... Take 1 Tablet By Mouth Once A Day 10)  Amitriptyline Hcl 25 Mg Tabs (Amitriptyline Hcl) .... Take 1 Tablet By Mouth At Bedtime For One Week Then Take 2 Tablets By Mouth At Bedtime 11)  Omeprazole 20 Mg Tbec (Omeprazole) .... Take 1 Tablet By Mouth Once A Day 12)  Pravastatin Sodium 20 Mg Tabs (Pravastatin Sodium) .... Take 1 Tablet By Mouth Once A Day 13)  Singulair 10 Mg Tabs (Montelukast Sodium) .... Take 1 Tablet By Mouth Once A Day  Allergies: 1)  ! Vicodin  Past History:  Past Medical History: Last updated: 04/27/2008 Carpal Tunnel syndrome (right hand surgery 05/15/1996, left hand surgery 05-15-1997) Allergic rhinitis Asthma Depression GERD Hyperlipidemia Hypothyroidism Psoriasis   Family History: Last updated: 04/27/2008 Family History Diabetes 1st degree relative Family History Hypertension Family History of Father died of Suicide (gunshot)  in 1996/05/15  Social History: Last updated: 08/24/2008 Married Current Smoker- one pack per day Alcohol use-no Drug use-no  Risk Factors: Exercise: yes (11/28/2008)  Risk Factors: Smoking Status: current (11/28/2008) Packs/Day: 0.5 (11/28/2008)  Family History: Reviewed  history from 04/27/2008 and no changes required. Family History Diabetes 1st degree relative Family History Hypertension Family History of Father died of Suicide (gunshot)  in 05/15/1996  Social History: Reviewed history from 08/24/2008 and no changes required. Married Current Smoker- one pack per day Alcohol use-no Drug use-no Does Patient Exercise:  yes  Review of Systems  The patient denies fever, chest pain, dyspnea on exertion, headaches, hemoptysis, abdominal pain, melena, hematochezia, hematuria, muscle weakness, difficulty walking, unusual weight change, and abnormal bleeding.    Physical Exam  General:  NAD Eyes:  vision grossly intact.   Ears:  no external deformities.   Nose:  no external deformity.   Mouth:   pharynx pink and moist,  no erythema, and no exudates.    Neck:  supple, full ROM, no thyromegaly, no JVD, and no carotid bruits.    Lungs:  normal respiratory effort, no accessory muscle use, normal breath sounds, no crackles, + end exp. wheezes Heart:  normal rate, regular rhythm, no murmur, no gallop, and no rub.    Abdomen:  soft, non-tender, normal bowel sounds, no distention, no guarding, no rebound tenderness, no hepatomegaly, and no splenomegaly.    Msk:  point tenderness on lateral right hip area, no joint warmth, no redness over joints, no joint deformities, and no joint instability.   Pulses:  2+ DP/PT pulses bilaterally  Extremities:  No cyanosis, clubbing, edema  Neurologic:  Nonfocal Psych:  normally interactive.     Impression & Recommendations:  Problem # 1:  HYPERTENSION (ICD-401.9) At goal. Continue current regimen.  Her updated medication list for this problem includes:    Lisinopril 5 Mg Tabs (Lisinopril) .Marland Kitchen... Take 1 tablet by mouth once a day  BP today: 128/93 Prior BP: 114/82 (11/01/2008)  Labs Reviewed: K+: 4.0 (06/29/2008) Creat: : 0.74 (06/29/2008)   Chol: 231 (04/28/2008)   HDL: 32 (04/28/2008)   LDL: 148 (04/28/2008)   TG: 254  (04/28/2008)  Problem # 2:  HIP PAIN, RIGHT (ICD-719.45) Most likely due to bursitis. Patient may benefit from steroid injection but she has no insurance so deferred on Sports medicine referral because she will not be able to pay for office visit. Will have patient start appling voltaren get as directed. In subsequent visit will consider steroid injection if she continues to experience current pain.  Her updated medication list for this problem includes:    Aspirin 81 Mg Tabs (Aspirin) .Marland Kitchen... Take one tablet by mouth daily  Problem # 3:  HYPERLIPIDEMIA (ICD-272.4) Will rechech FLP and reasses.  Her updated medication list for this problem includes:    Pravastatin Sodium 20 Mg Tabs (Pravastatin sodium) .Marland Kitchen... Take 1 tablet by mouth once a day  Future Orders: T-Lipid Profile ([id number]) ... 11/29/2008  Labs Reviewed: SGOT: 22 (04/28/2008)   SGPT: 29 (04/28/2008)   HDL:32 (04/28/2008)  LDL:148 (04/28/2008)  Chol:231 (04/28/2008)  Trig:254 (04/28/2008)  Problem # 4:  HYPOTHYROIDISM (ICD-244.9) Will review labs and reasses need to change medicaiton  Her updated medication list for this problem includes:    Synthroid 50 Mcg Tabs (Levothyroxine sodium) .Marland Kitchen... Take 1 tablet by mouth once a day  Future Orders: T-TSH ([id number]) ... 11/29/2008  Labs Reviewed: TSH: 3.105 (06/20/2008)    HgBA1c: 6.1 (06/27/2008) Chol: 231 (04/28/2008)   HDL: 32 (04/28/2008)   LDL: 148 (04/28/2008)   TG: 254 (04/28/2008)  Problem # 5:  ASTHMA (ICD-493.90) Stable. Will continue current regimen.  Her updated medication list for this problem includes:    Ventolin Hfa 108 (90 Base) Mcg/act Aers (Albuterol sulfate) .Marland Kitchen... 2 puffs inhaled every 4-6 hours as needed    Singulair 10 Mg Tabs (Montelukast sodium) .Marland Kitchen... Take 1 tablet by mouth once a day  Problem # 6:  TOBACCO ABUSE (ICD-305.1) Encouraged smoking cessation and discussed different methods for smoking cessation.   Complete Medication List: 1)   Ventolin Hfa 108 (90 Base) Mcg/act Aers (Albuterol sulfate) .... 2 puffs inhaled every 4-6 hours as needed 2)  Fish Oil 1360 Mg Caps (Omega-3 fatty acids) .... Take 1 tablet by mouth once a day 3)  Aspirin 81 Mg Tabs (Aspirin) .... Take one tablet by mouth daily 4)  Cod Liver Oil 1000 Mg Caps (Cod liver oil) 5)  Chlor-trimeton Allergy 12 Mg Cr-tabs (Chlorpheniramine maleate) .... Take two tablets by mouth dailly 6)  Vitamin B-6 250 Mg Tabs (Pyridoxine hcl) .... Take two tables by mouth daily 7)  Hydrocortisone 2.5 % Oint (Hydrocortisone) .... Apply thin layer over area of itching twice a day 8)  Synthroid 50 Mcg Tabs (Levothyroxine sodium) .... Take 1 tablet by mouth once a day 9)  Lisinopril 5 Mg Tabs (Lisinopril) .... Take 1 tablet by mouth once a day 10)  Amitriptyline Hcl 25 Mg Tabs (Amitriptyline hcl) .... Take 1 tablet by mouth at bedtime for one week then take 2 tablets by mouth at bedtime 11)  Omeprazole 20 Mg Tbec (Omeprazole) .... Take 1 tablet by mouth once a day 12)  Pravastatin Sodium 20 Mg Tabs (Pravastatin sodium) .... Take 1 tablet by mouth once a day 13)  Singulair 10 Mg Tabs (Montelukast sodium) .... Take 1 tablet by mouth once a day 14)  Voltaren 1 % Gel (Diclofenac sodium) .... Apply 4g of gel to affected area 4 times a daily  Other Orders: Flu Vaccine 68yr + (([id number] Admin 1st Vaccine (([id number] Future Orders: T-Comprehensive Metabolic Panel ([id number] ... 11/29/2008  Patient Instructions: 1)  Please schedule a follow-up appointment in 2 months. 2)  You will be called with any abnormalities in the tests scheduled or performed today.  If you don't hear from [facility] [place] week from when the test was performed, you can assume that your test was normal. 3)  Return to clinic fasting to get blood work. 4)  Take all medication as directed. Prescriptions: VOLTAREN 1 % GEL (DICLOFENAC SODIUM) Apply 4g of gel to affected area 4 times a daily  #100g x 1   Entered and Authorized by:    RRudie MeyerMD   Signed by:   RRudie MeyerMD on 11/28/2008   Method used:   Print then Give to Patient   RxID:   [id number] Process Orders Check Orders Results:     [facility]: AGURnot required for this insurance Tests Sent for requisitioning (November 30, 2008 3:58 PM):     11/29/2008: [facility] -- T-Lipid Profile [[id number](signed)     11/29/2008: [facility] -- T-Comprehensive Metabolic Panel [[[id number]](signed)     11/29/2008: [facility] -- T-TSH [[id number](signed)    Prevention & Chronic Care Immunizations   Influenza vaccine: Fluvax 3+  (11/28/2008)   Influenza vaccine deferral: Deferred  (11/01/2008)    Tetanus booster: Not documented   Td booster deferral: Deferred  (11/01/2008)    Pneumococcal vaccine: Not documented  Other Screening   Pap smear: UNSATISFACTORY FOR EVALUATION.  THE SPECIMEN IS PROCESSED  (07/25/2008)    Mammogram: Not documented   Mammogram action/deferral: Ordered  (10/18/2008)   Smoking status: current  (11/28/2008)   Smoking cessation counseling: yes  (11/28/2008)  Lipids   Total Cholesterol: 231  (04/28/2008)   Lipid panel action/deferral: Lipid Panel ordered   LDL: 148  (04/28/2008)   LDL Direct: Not documented   HDL: 32  (04/28/2008)   Triglycerides: 254  (04/28/2008)    SGOT (AST): 22  (04/28/2008)   BMP action: Ordered   SGPT (ALT): 29  (04/28/2008) CMP ordered    Alkaline phosphatase: 83  (04/28/2008)   Total bilirubin: 0.3  (04/28/2008)    Lipid flowsheet reviewed?: Yes   Progress toward LDL goal: Unchanged  Hypertension   Last Blood Pressure: 128 / 93  (11/28/2008)   Serum creatinine: 0.74  (  06/29/2008)   BMP action: Ordered   Serum potassium 4.0  (06/29/2008) CMP ordered     Hypertension flowsheet reviewed?: Yes   Progress toward BP goal: At goal  Self-Management Support :   Personal Goals (by the next  clinic visit) :      Personal blood pressure goal: 130/80  (11/28/2008)     Personal LDL goal: 130  (11/28/2008)    Patient will work on the following items until the next clinic visit to reach self-care goals:     Medications and monitoring: take my medicines every day, bring all of my medications to every visit  (11/28/2008)     Eating: eat more vegetables, use fresh or frozen vegetables, eat foods that are low in salt, eat baked foods instead of fried foods, eat fruit for snacks and desserts  (11/28/2008)     Activity: take a 30 minute walk every day  (11/01/2008)    Hypertension self-management support: Written self-care plan  (11/28/2008)   Hypertension self-care plan printed.    Lipid self-management support: Written self-care plan  (11/28/2008)   Lipid self-care plan printed.   Process Orders Check Orders Results:     [facility]: [facility] not required for this insurance Tests Sent for requisitioning (November 30, 2008 3:58 PM):     11/29/2008: [facility] -- T-Lipid Profile [id number] (signed)     11/29/2008: [facility] -- T-Comprehensive Metabolic Panel [[id number]] (signed)     11/29/2008: [facility] -- T-TSH [phone] (signed)    Immunizations Administered:  Influenza Vaccine # 1:    Vaccine Type: Fluvax 3+    Site: left deltoid    Mfr: novartis    Dose: 0.5 ml    Route: IM    Given by: Jarrett Ables CMA    Exp. Date: 03/    Lot #: [id number]    VIS given: 10/01/06 version given November 28, 2008.  Flu Vaccine Consent Questions:    Do you have a history of severe allergic reactions to this vaccine? no    Any prior history of allergic reactions to egg and/or gelatin? no    Do you have a sensitivity to the preservative Thimersol? no    Do you have a past history of Guillan-Barre Syndrome? no    Do you currently have an acute febrile illness? no    Have you ever had a severe reaction to  latex? no    Vaccine information given and explained to patient? yes    Are you currently pregnant? no

## 2010-04-11 NOTE — Assessment & Plan Note (Signed)
Summary: 37MONTH F/U/EST/VS   Vital Signs:  Patient profile:   50 year old female Height:      64 inches (162.56 cm) Weight:      255.4 pounds (116.09 kg) BMI:     44.00 Temp:     97.8 degrees F (36.56 degrees C) oral Pulse rate:   99 / minute BP sitting:   128 / 87  (right arm)  Vitals Entered By: Silverio Decamp NT II (Jul 25, 2008 10:59 AM) CC: HIP PAIN, CREAKING IN NECK, ARM AND SHOULDER PAIN  TENSION HEADACHES, Depression Is Patient Diabetic? No Pain Assessment Patient in pain? yes     Location: shoulder Intensity: 5 Type: aching Nutritional Status BMI of > 30 = obese  Does patient need assistance? Functional Status Self care Ambulation Normal   CC:  HIP PAIN, CREAKING IN NECK, ARM AND SHOULDER PAIN  TENSION HEADACHES, and Depression.  History of Present Illness: 50 yr old woman with pmhx as described below comes to the clinic complaining of feeling more depressed than usual. Associated with body aches and headaches. Her depressed mood has been more severe in the last couple of days. Denies suicidal ideation or homocidal ideation.  Hasn't had a pap smear in 4 years. Patient states that she has had some House staining on her underwear recently that looks like dried blood and that sometimes after sexual intercourse she has noticed blood on undergarments. Patient has had symptoms for 2 months. Last menstrual period was 5 years ago.   Depression History:      The patient denies a depressed mood most of the day and a diminished interest in her usual daily activities.         Preventive Screening-Counseling & Management     Alcohol type: occasionally     Smoking Status: current     Smoking Cessation Counseling: yes     Packs/Day: <1     Year Started: smoking 33 yrs     Does Patient Exercise: no  Problems Prior to Update: 1)  Microscopic Hematuria  (ICD-599.72) 2)  Hematochezia  (ICD-578.1) 3)  Other Nonspecific Finding Examination of Urine  (ICD-791.9) 4)  Tobacco Abuse   (ICD-305.1) 5)  Hypertension  (ICD-401.9) 6)  Skin Rash  (ICD-782.1) 7)  Family History Diabetes 1st Degree Relative  (ICD-V18.0) 8)  Hypothyroidism  (ICD-244.9) 9)  Hyperlipidemia  (ICD-272.4) 10)  Gerd  (ICD-530.81) 11)  Depression  (ICD-311) 12)  Asthma  (ICD-493.90) 13)  Allergic Rhinitis  (ICD-477.9)  Medications Prior to Update: 1)  Ventolin Hfa 108 (90 Base) Mcg/act Aers (Albuterol Sulfate) .... 2 Puffs Inhaled Every 4-6 Hours As Needed 2)  Fish Oil 1000 Mg Caps (Omega-3 Fatty Acids) .... Takes 2 Tablets By Mouth Daily 3)  Aspirin 81 Mg Tabs (Aspirin) .... Take One Tablet By Mouth Daily 4)  Cod Liver Oil 1000 Mg Caps (Cod Liver Oil) 5)  Chlor-Trimeton Allergy 12 Mg Cr-Tabs (Chlorpheniramine Maleate) .... Take Two Tablets By Mouth Dailly 6)  Vitamin B-6 250 Mg Tabs (Pyridoxine Hcl) .... Take Two Tables By Mouth Daily 7)  Dramamine 50 Mg Tabs (Dimenhydrinate) .... Taking 2 Tablets By Mouth Daily 8)  Hydrocortisone 2.5 % Oint (Hydrocortisone) .... Apply Thin Layer Over Area of Itching Twice A Day 9)  Celexa 20 Mg Tabs (Citalopram Hydrobromide) .... Take One Tablet By Mouth Daily 10)  Synthroid 50 Mcg Tabs (Levothyroxine Sodium) .... Take 1 Tablet By Mouth Once A Day 11)  Lisinopril 5 Mg Tabs (Lisinopril) .... Take  1 Tablet By Mouth Once A Day  Current Medications (verified): 1)  Ventolin Hfa 108 (90 Base) Mcg/act Aers (Albuterol Sulfate) .... 2 Puffs Inhaled Every 4-6 Hours As Needed 2)  Fish Oil 1360 Mg Caps (Omega-3 Fatty Acids) .... Take 1 Tablet By Mouth Once A Day 3)  Aspirin 81 Mg Tabs (Aspirin) .... Take One Tablet By Mouth Daily 4)  Cod Liver Oil 1000 Mg Caps (Cod Liver Oil) 5)  Chlor-Trimeton Allergy 12 Mg Cr-Tabs (Chlorpheniramine Maleate) .... Take Two Tablets By Mouth Dailly 6)  Vitamin B-6 250 Mg Tabs (Pyridoxine Hcl) .... Take Two Tables By Mouth Daily 7)  Hydrocortisone 2.5 % Oint (Hydrocortisone) .... Apply Thin Layer Over Area of Itching Twice A Day 8)  Synthroid  50 Mcg Tabs (Levothyroxine Sodium) .... Take 1 Tablet By Mouth Once A Day 9)  Lisinopril 5 Mg Tabs (Lisinopril) .... Take 1 Tablet By Mouth Once A Day 10)  Amitriptyline Hcl 25 Mg Tabs (Amitriptyline Hcl) .... Take 1 Tablet By Mouth Once A Day For One Week Then Take 2 Tablets By Mouth Once A Day 11)  Acid Control Maximum Strength 150 Mg Tabs (Ranitidine Hcl)  Allergies: 1)  ! Vicodin  Past History:  Past Medical History:    Carpal Tunnel syndrome (right hand surgery May 22, 1996, left hand surgery 05/22/1997)    Allergic rhinitis    Asthma    Depression    GERD    Hyperlipidemia    Hypothyroidism    Psoriasis  (04/27/2008)  Family History:    Family History Diabetes 1st degree relative    Family History Hypertension    Family History of Father died of Suicide (gunshot)  in 05/22/1996     (04/27/2008)  Social History:    Married    Current Smoker    Alcohol use-no    Drug use-no     (04/27/2008)  Risk Factors:    Alcohol Use: N/A    >5 drinks/d w/in last 3 months: N/A    Caffeine Use: N/A    Diet: N/A    Exercise: no (07/25/2008)  Risk Factors:    Smoking Status: current (07/25/2008)    Packs/Day: <1 (07/25/2008)    Cigars/wk: N/A    Pipe Use/wk: N/A    Cans of tobacco/wk: N/A    Passive Smoke Exposure: N/A  Family History:    Reviewed history from 04/27/2008 and no changes required:       Family History Diabetes 1st degree relative       Family History Hypertension       Family History of Father died of Suicide (gunshot)  in May 22, 1996  Social History:    Reviewed history from 04/27/2008 and no changes required:       Married       Current Smoker       Alcohol use-no       Drug use-no  Review of Systems       The patient complains of headaches.  The patient denies fever, syncope, prolonged cough, abdominal pain, melena, hematochezia, severe indigestion/heartburn, hematuria, muscle weakness, and difficulty walking.    Physical Exam  General:  overweight-appearing.   Eyes:   vision grossly intact.   Ears:  no external deformities.   Nose:  no external deformity.   Neck:  supple and full ROM.   Lungs:  Normal respiratory effort, chest expands symmetrically. Lungs are clear to auscultation, no crackles or wheezes. Heart:  Normal rate and regular rhythm. S1 and S2 normal  without gallop, murmur, click, rub or other extra sounds. Abdomen:  soft, non-tender, and normal bowel sounds.   Msk:  normal ROM.    Pelvic Exam  Vulva:      normal appearance, normal hair distribution, no lesions or masses.   Vagina:      normal, ruggated, physiologic discharge, no lesions, no masses, no cystocele, adequate pelvic support.   Cervix:      scant petechia at 9 o'clock    Impression & Recommendations:  Problem # 1:  POSTMENOPAUSAL BLEEDING (ICD-627.1) Differential includes:Atrophy, Polyps, Endometrial cancer, Endometrial hyperplasia, Hormonal effect, Cervical cancer. Primary concern is to exclude malignancy. Will referr to Gynecologists for further management such as endometrial biopsy and/or transvaginal ultrasound.   Orders: Gynecologic Referral (Gyn) T-Pap Smear, Thin Prep ([id number])  Problem # 2:  HYPERTENSION (ICD-401.9) Good control. Will continue current treatment.   Her updated medication list for this problem includes:    Lisinopril 5 Mg Tabs (Lisinopril) .Marland Kitchen... Take 1 tablet by mouth once a day  BP today: 128/87 Prior BP: 134/89 (06/27/2008)  Labs Reviewed: K+: 4.0 (06/29/2008) Creat: : 0.74 (06/29/2008)   Chol: 231 (04/28/2008)   HDL: 32 (04/28/2008)   LDL: 148 (04/28/2008)   TG: 254 (04/28/2008)  Problem # 3:  DEPRESSION (ICD-311) Increased depressed mood with body aches and migraine headaches. Will change depression medication and call patient next week to follow up on symptoms.  Patient denies SSI/HSI.   The following medications were removed from the medication list:    Celexa 20 Mg Tabs (Citalopram hydrobromide) .Marland Kitchen... Take one tablet by mouth daily  Her updated medication list for this problem includes:    Amitriptyline Hcl 25 Mg Tabs (Amitriptyline hcl) .Marland Kitchen... Take 1 tablet by mouth at bedtime for one week then take 2 tablets by mouth at bedtime  Problem # 4:  GERD (ICD-530.81) Patient stated that Ranitidine providing minor relief of reflux symptoms. Will have her stop Ranitidine and start PPI. Will follow up.  Her updated medication list for this problem includes:    Omeprazole 20 Mg Tbec (Omeprazole) .Marland Kitchen... Take 1 tablet by mouth once a day  Problem # 5:  HEMATOCHEZIA (ICD-578.1) Patient denied any recent episodes of hematochezia. Patient deferred colonoscopy until orange card is acquired which she is still working on with [employer].  Complete Medication List: 1)  Ventolin Hfa 108 (90 Base) Mcg/act Aers (Albuterol sulfate) .... 2 puffs inhaled every 4-6 hours as needed 2)  Fish Oil 1360 Mg Caps (Omega-3 fatty acids) .... Take 1 tablet by mouth once a day 3)  Aspirin 81 Mg Tabs (Aspirin) .... Take one tablet by mouth daily 4)  Cod Liver Oil 1000 Mg Caps (Cod liver oil) 5)  Chlor-trimeton Allergy 12 Mg Cr-tabs (Chlorpheniramine maleate) .... Take two tablets by mouth dailly 6)  Vitamin B-6 250 Mg Tabs (Pyridoxine hcl) .... Take two tables by mouth daily 7)  Hydrocortisone 2.5 % Oint (Hydrocortisone) .... Apply thin layer over area of itching twice a day 8)  Synthroid 50 Mcg Tabs (Levothyroxine sodium) .... Take 1 tablet by mouth once a day 9)  Lisinopril 5 Mg Tabs (Lisinopril) .... Take 1 tablet by mouth once a day 10)  Amitriptyline Hcl 25 Mg Tabs (Amitriptyline hcl) .... Take 1 tablet by mouth at bedtime for one week then take 2 tablets by mouth at bedtime 11)  Omeprazole 20 Mg Tbec (Omeprazole) .... Take 1 tablet by mouth once a day  Patient Instructions: 1)  Please schedule a follow-up  appointment in 1 month. 2)  Go to Gynecology referral. 3)  Take all medication as indicated. Prescriptions: AMITRIPTYLINE HCL 25 MG TABS  (AMITRIPTYLINE HCL) Take 1 tablet by mouth at bedtime for one week then Take 2 tablets by mouth at bedtime  #60 x 1   Entered and Authorized by:   Rudie Meyer MD   Signed by:   Rudie Meyer MD on 07/25/2008   Method used:   Print then Give to Patient   RxID:   [id number] OMEPRAZOLE 20 MG TBEC (OMEPRAZOLE) Take 1 tablet by mouth once a day  #30 x 1   Entered and Authorized by:   Rudie Meyer MD   Signed by:   Rudie Meyer MD on 07/25/2008   Method used:   Print then Give to Patient   RxID:   [id number]

## 2010-04-11 NOTE — Progress Notes (Signed)
Summary: med refill/gp  Phone Note Refill Request Message from:  Fax from Pharmacy on December 31, 2009 2:39 PM  Refills Requested: Medication #1:  OMEPRAZOLE 40 MG CPDR Take 1 tablet by mouth two times a day   Last Refilled: 12/14/2009 Last appt. 11/28/09.   Method Requested: Electronic Initial call taken by: Morrison Old RN,  December 31, 2009 2:39 PM    Prescriptions: OMEPRAZOLE 40 MG CPDR (OMEPRAZOLE) Take 1 tablet by mouth two times a day  #60 x 3   Entered and Authorized by:   Rudie Meyer MD   Signed by:   Rudie Meyer MD on 12/31/2009   Method used:   Electronically to        [facility] [phone]* (retail)       [address]       [place], [place]  [address]       Ph: [phone]       Fax: [phone]   RxID:   [id number]

## 2010-04-11 NOTE — Miscellaneous (Signed)
Summary: [facility]   [facility]   Imported By: Garlan Fillers 07/31/2009 10:56:26  _____________________________________________________________________  External Attachment:    Type:   Image     Comment:   External Document

## 2010-04-11 NOTE — Assessment & Plan Note (Signed)
Summary: ACUTE- A LITTLE BLOOD IN STOOL X 2 WKS/CFB(VEGA)   Vital Signs:  Patient profile:   50 year old female Height:      63 inches (160.02 cm) Weight:      261.0 pounds (118.64 kg) BMI:     46.40 Temp:     97.2 degrees F oral Pulse rate:   96 / minute BP sitting:   104 / 72  (right arm) Cuff size:   large  Vitals Entered By: Morrison Old RN (November 28, 2009 2:13 PM) CC: Blood in stools - bright red- x 2 weeks. Right shoulder pain. Left arm was swollen; started  last night. Is Patient Diabetic? No Pain Assessment Patient in pain? yes     Location: right shoulder Intensity: 8 Type: sharp Onset of pain  Intermittent Nutritional Status BMI of > 30 = obese  Have you ever been in a relationship where you felt threatened, hurt or afraid?No   Does patient need assistance? Functional Status Self care Ambulation Normal   Primary Care Provider:  Kathlen Brunswick MD  CC:  Blood in stools - bright red- x 2 weeks. Right shoulder pain. Left arm was swollen; started  last night..  History of Present Illness: Kendra Adams is a 50 year old woman with pmh significant for COPD, Tobacco abuse, hypothyroidism, HLD, GERD, Depression who presents to clinic for follow up.  1) Patient was seen in the ED for fall and headache. Pt had fall 2 weeks ago. No further complications from fall. CT head and spine were normal.   2) Patient also had a UTI with UC showing EColi resistant to cipro, bactrim and levo and sensitive to macrobid, thus abx were changed. Pt has almost completed course.   3) Blood in stool - light streaks of blood in stool for the last 2 weeks. Pt has had this in the past. Denies abdominal pain, no nausea or vomiting. No hx of hemorrhoids. Denies straining on defecation, has normal and regular bm. No family hx of colon cancer. No previous colonoscopy.   4) R Shoulder pain - overworked from yardwork  5) Pain in left forearm from using wheel barrel      Depression  History:      The patient denies a depressed mood most of the day and a diminished interest in her usual daily activities.         Preventive Screening-Counseling & Management  Alcohol-Tobacco     Smoking Status: current     Smoking Cessation Counseling: yes     Packs/Day: 0.5     Year Started: smoking 33 yrs  Caffeine-Diet-Exercise     Does Patient Exercise: yes     Type of exercise: YARD WORK  Current Medications (verified): 1)  Ventolin Hfa 108 (90 Base) Mcg/act Aers (Albuterol Sulfate) .... 2 Puffs Inhaled Every 4-6 Hours As Needed 2)  Fish Oil 1360 Mg Caps (Omega-3 Fatty Acids) .... Take 1 Tablet By Mouth Once A Day 3)  Aspirin 81 Mg Tabs (Aspirin) .... Take One Tablet By Mouth Daily 4)  Cod Liver Oil 1000 Mg Caps (Cod Liver Oil) 5)  Vitamin B-6 250 Mg Tabs (Pyridoxine Hcl) .... Take Two Tables By Mouth Daily 6)  Hydrocortisone 2.5 % Oint (Hydrocortisone) .... Apply Thin Layer Over Area of Itching Twice A Day 7)  Synthroid 75 Mcg Tabs (Levothyroxine Sodium) .... Take 1 Tablet By Mouth Once A Day 8)  Lisinopril 5 Mg Tabs (Lisinopril) .... Take 1 Tablet By Mouth Once A  Day 9)  Omeprazole 40 Mg Cpdr (Omeprazole) .... Take 1 Tablet By Mouth Two Times A Day 10)  Pravachol 40 Mg Tabs (Pravastatin Sodium) .... Take 1 Tablet By Mouth Once A Day 11)  Sertraline Hcl 25 Mg Tabs (Sertraline Hcl) .... Take 1 Tab By Mouth Every Night 12)  Advair Diskus 250-50 Mcg/dose Aepb (Fluticasone-Salmeterol) .... Take 1 Puff Two Times A Day 13)  Macrobid 100 Mg Caps (Nitrofurantoin Monohyd Macro) .... Take 1 Tablet By Mouth Two Times A Day For 7 Days. 14)  Tramadol Hcl 50 Mg Tabs (Tramadol Hcl) .... Take 1 Tab Every 4 Hours As Needed For Pain  Allergies (verified): 1)  ! Vicodin  Past History:  Past Medical History: Last updated: 04/27/2008 Carpal Tunnel syndrome (right hand surgery Jun 01, 1996, left hand surgery 06-01-97) Allergic  rhinitis Asthma Depression GERD Hyperlipidemia Hypothyroidism Psoriasis   Family History: Last updated: 04/27/2008 Family History Diabetes 1st degree relative Family History Hypertension Family History of Father died of Suicide (gunshot)  in 01-Jun-1996  Social History: Last updated: 08/20/2009 Married Current Smoker- one quarter pack per day, slowly quitting. Alcohol use-no Drug use-no  Risk Factors: Exercise: yes (11/28/2009)  Risk Factors: Smoking Status: current (11/28/2009) Packs/Day: 0.5 (11/28/2009)  Social History: Smoking Status:  current  Review of Systems      See HPI  Physical Exam  General:  alert and well-developed.   Head:  normocephalic and atraumatic.   Eyes:  vision grossly intact, pupils equal, pupils round, pupils reactive to light, pupils react to accomodation, and corneas and lenses clear.   Neck:  supple.   Lungs:  normal respiratory effort and normal breath sounds.   Heart:  normal rate, regular rhythm, no murmur, no gallop, and no rub.   Abdomen:  soft, non-tender, and normal bowel sounds.   Rectal:  no external abnormalities, no hemorrhoids, normal sphincter tone, no masses, no tenderness, no fissures, no fistulae, and no perianal rash.   no blood found on glove FOBT negative   Impression & Recommendations:  Problem # 1:  HEMATOCHEZIA (ICD-578.1) Assessment Deteriorated Hx of hematochezia in the past. Pt not found to have hemorrhoids. Blood was in stool, not on toilet bowl or tissue paper. Pt has never had colonoscopy due to lack of insurance. Pt would like to pursue further work up now, and get a colonoscopy of which she will pay for it herself. Heme occult was negative. Pt also denies abdominal pain, no fever, unlikely to be infectious.  Plan: -Colonoscopy for further evaluation  Orders: Gastroenterology Referral (GI)  Complete Medication List: 1)  Ventolin Hfa 108 (90 Base) Mcg/act Aers (Albuterol sulfate) .... 2 puffs inhaled every  4-6 hours as needed 2)  Fish Oil 1360 Mg Caps (Omega-3 fatty acids) .... Take 1 tablet by mouth once a day 3)  Aspirin 81 Mg Tabs (Aspirin) .... Take one tablet by mouth daily 4)  Cod Liver Oil 1000 Mg Caps (Cod liver oil) 5)  Vitamin B-6 250 Mg Tabs (Pyridoxine hcl) .... Take two tables by mouth daily 6)  Hydrocortisone 2.5 % Oint (Hydrocortisone) .... Apply thin layer over area of itching twice a day 7)  Synthroid 75 Mcg Tabs (Levothyroxine sodium) .... Take 1 tablet by mouth once a day 8)  Lisinopril 5 Mg Tabs (Lisinopril) .... Take 1 tablet by mouth once a day 9)  Omeprazole 40 Mg Cpdr (Omeprazole) .... Take 1 tablet by mouth two times a day 10)  Pravachol 40 Mg Tabs (Pravastatin sodium) .... Take 1  tablet by mouth once a day 11)  Sertraline Hcl 25 Mg Tabs (Sertraline hcl) .... Take 1 tab by mouth every night 12)  Advair Diskus 250-50 Mcg/dose Aepb (Fluticasone-salmeterol) .... Take 1 puff two times a day 13)  Macrobid 100 Mg Caps (Nitrofurantoin monohyd macro) .... Take 1 tablet by mouth two times a day for 7 days. 14)  Tramadol Hcl 50 Mg Tabs (Tramadol hcl) .... Take 1 tab every 4 hours as needed for pain  Patient Instructions: 1)  Please follow up with [name] for colonoscopy  Prescriptions: TRAMADOL HCL 50 MG TABS (TRAMADOL HCL) Take 1 tab every 4 hours as needed for pain  #60 x 0   Entered and Authorized by:   Jolene Provost MD   Signed by:   Jolene Provost MD on 11/28/2009   Method used:   Electronically to        [facility] [phone]* (retail)       [address]       [place], [place]  [address]       Ph: [phone]       Fax: [phone]   RxID:   [id number]   Prevention & Chronic Care Immunizations   Influenza vaccine: Fluvax Non-MCR  (11/21/2009)   Influenza vaccine deferral: Deferred  (11/01/2008)    Tetanus booster: Not documented   Td booster deferral: Deferred  (11/01/2008)    Pneumococcal vaccine: Not documented  Colorectal Screening    Hemoccult: Not documented   Hemoccult action/deferral: Deferred  (05/29/2009)    Colonoscopy: Not documented   Colonoscopy action/deferral: GI referral  (11/28/2009)  Other Screening   Pap smear: UNSATISFACTORY FOR EVALUATION.  THE SPECIMEN IS PROCESSED  (07/25/2008)    Mammogram: ASSESSMENT: Negative - BI-RADS 1^MS DIGITAL SCREENING  (12/13/2008)   Mammogram action/deferral: Ordered  (10/18/2008)   Smoking status: current  (11/28/2009)   Smoking cessation counseling: yes  (11/28/2009)  Lipids   Total Cholesterol: 214  (01/17/2009)   Lipid panel action/deferral: Lipid Panel ordered   LDL: 148  (01/17/2009)   LDL Direct: Not documented   HDL: 34  (01/17/2009)   Triglycerides: 162  (01/17/2009)    SGOT (AST): 24  (01/17/2009)   BMP action: Ordered   SGPT (ALT): 30  (01/17/2009)   Alkaline phosphatase: 107  (01/17/2009)   Total bilirubin: 0.3  (01/17/2009)  Hypertension   Last Blood Pressure: 104 / 72  (11/28/2009)   Serum creatinine: 0.83  (11/21/2009)   BMP action: Ordered   Serum potassium 4.2  (11/21/2009)  Self-Management Support :   Personal Goals (by the next clinic visit) :      Personal blood pressure goal: 140/90  (11/21/2009)     Personal LDL goal: 130  (11/28/2008)    Patient will work on the following items until the next clinic visit to reach self-care goals:     Medications and monitoring: take my medicines every day, bring all of my medications to every visit  (11/28/2009)     Eating: eat more vegetables, use fresh or frozen vegetables  (11/28/2009)     Activity: take a 30 minute walk every day  (11/28/2009)     Other: yard work  (01/18/2009)    Hypertension self-management support: Written self-care plan  (11/28/2009)   Hypertension self-care plan printed.    Lipid self-management support: Written self-care plan  (11/28/2009)   Lipid self-care plan printed.   Nursing Instructions: GI referral for screening colonoscopy (see order)

## 2010-04-11 NOTE — Progress Notes (Signed)
Summary: phone/gg  Phone Note Call from Patient   Caller: Patient Summary of Call: Pt given tramadol on last visit 9/21 and it causes her nausea.  She has taken in past and was given promethazine ( 9/5 #5 ) with it.  She has tramadol but is asking for some more promethazine.   Initial call taken by: Gevena Cotton RN,  November 30, 2009 3:05 PM  Follow-up for Phone Call        I dont see phenergan on her med list??? Dr. Ina Homes just saw patient. Follow-up by: Rhea Pink  DO,  November 30, 2009 4:30 PM  Additional Follow-up for Phone Call Additional follow up Details #1::        Tawanna Sat was given in the ED by Dr Eulis Foster. She got #5 and cut them in half. Additional Follow-up by: Gevena Cotton RN,  November 30, 2009 5:07 PM    Additional Follow-up for Phone Call Additional follow up Details #2::    no phenergan will have to be seen -should not take tramadol if she is getting nauseated.  Follow-up by: Rhea Pink  DO,  November 30, 2009 5:09 PM  Additional Follow-up for Phone Call Additional follow up Details #3:: Details for Additional Follow-up Action Taken: Pt informed Patient/caller verbalizes understanding of these instructions.  Additional Follow-up by: Gevena Cotton RN,  November 30, 2009 5:13 PM

## 2010-04-11 NOTE — Consult Note (Signed)
Summary: [facility]   Imported By: Lacy Duverney 12/27/2009 15:03:50  _____________________________________________________________________  External Attachment:    Type:   Image     Comment:   External Document

## 2010-04-11 NOTE — Progress Notes (Signed)
Summary: refill/gg  Phone Note Refill Request  on February 06, 2009 4:08 PM  Refills Requested: Medication #1:  AMITRIPTYLINE HCL 25 MG TABS Take 1 tablet by mouth at bedtime for one week then Take 2 tablets by mouth at bedtime Initial call taken by: Gevena Cotton RN,  February 06, 2009 4:08 PM    Done.

## 2010-04-11 NOTE — Assessment & Plan Note (Signed)
Summary: CHECKUP/SB.   Vital Signs:  Patient profile:   50 year old female Height:      63 inches (160.02 cm) Weight:      275 pounds (125.00 kg) BMI:     48.89 Temp:     98.1 degrees F (36.72 degrees C) oral Pulse rate:   99 / minute BP sitting:   137 / 75  (left arm)  Vitals Entered By: Sander Nephew RN (Jul 16, 2009 3:44 PM) CC: Depression Is Patient Diabetic? No Pain Assessment Patient in pain? yes     Location: right shoulder Intensity: 10 plus Type: aching, sharp Onset of pain  Constant Nutritional Status BMI of > 30 = obese  Have you ever been in a relationship where you felt threatened, hurt or afraid?No   Does patient need assistance? Functional Status Self care Ambulation Normal Comments Problems with shoulder right rotator cuff.  Constantly hurting.  Taking Ibuprobenn 800 mg.  Helps sometimes.  Feeling poking in shoulder.  Dr. Para March did pt's surgery on her shoulder before.  Problems with 2 middle toes   Primary Care Provider:  Kathlen Brunswick MD  CC:  Depression.  History of Present Illness: 50 yr old woman with pmhx as described below comes to the clinic follow up. Patient states that hoarness has resolved after increasing omeprazole. Patient completed course of doxycycline and prednisone. Patient has right shoulder pain. Patient has pain at night and when lifting arm above head. Pattient has no complains. Denies shortness of breath, chest pain, palpitations, diaphoresis.   Depression History:      The patient denies a depressed mood most of the day and a diminished interest in her usual daily activities.        Comments:  Medication is helping.   Preventive Screening-Counseling & Management  Alcohol-Tobacco     Alcohol type: occasionally     Smoking Status: quit     Smoking Cessation Counseling: yes     Packs/Day: 0.5     Year Started: smoking 33 yrs  Comments: Smokes less than a pack per day sometimes less than that.  Problems Prior to  Update: 1)  Chronic Obstructive Pulmonary Disease, Acute Exacerbation  (ICD-491.21) 2)  Hip Pain, Right  (ICD-719.45) 3)  Pain in Joint, Shoulder Region  (ICD-719.41) 4)  Postmenopausal Bleeding  (ICD-627.1) 5)  Microscopic Hematuria  (ICD-599.72) 6)  Hematochezia  (ICD-578.1) 7)  Other Nonspecific Finding Examination of Urine  (ICD-791.9) 8)  Tobacco Abuse  (ICD-305.1) 9)  Hypertension  (ICD-401.9) 10)  Skin Rash  (ICD-782.1) 11)  Family History Diabetes 1st Degree Relative  (ICD-V18.0) 12)  Hypothyroidism  (ICD-244.9) 13)  Hyperlipidemia  (ICD-272.4) 14)  Gerd  (ICD-530.81) 15)  Depression  (ICD-311) 16)  Asthma  (ICD-493.90) 17)  Allergic Rhinitis  (ICD-477.9)  Medications Prior to Update: 1)  Ventolin Hfa 108 (90 Base) Mcg/act Aers (Albuterol Sulfate) .... 2 Puffs Inhaled Every 4-6 Hours As Needed 2)  Fish Oil 1360 Mg Caps (Omega-3 Fatty Acids) .... Take 1 Tablet By Mouth Once A Day 3)  Aspirin 81 Mg Tabs (Aspirin) .... Take One Tablet By Mouth Daily 4)  Cod Liver Oil 1000 Mg Caps (Cod Liver Oil) 5)  Vitamin B-6 250 Mg Tabs (Pyridoxine Hcl) .... Take Two Tables By Mouth Daily 6)  Hydrocortisone 2.5 % Oint (Hydrocortisone) .... Apply Thin Layer Over Area of Itching Twice A Day 7)  Synthroid 50 Mcg Tabs (Levothyroxine Sodium) .... Take 1 Tablet By Mouth Once A Day 8)  Lisinopril 5 Mg Tabs (Lisinopril) .... Take 1 Tablet By Mouth Once A Day 9)  Amitriptyline Hcl 25 Mg Tabs (Amitriptyline Hcl) .... Take 1 Tablet By Mouth At Bedtime For One Week and Then Stop 10)  Omeprazole 20 Mg Tbec (Omeprazole) .... Take 1 Tablet By Mouth Once A Day 11)  Pravachol 40 Mg Tabs (Pravastatin Sodium) .... Take 1 Tablet By Mouth Once A Day 12)  Singulair 10 Mg Tabs (Montelukast Sodium) .... Take 1 Tablet By Mouth Once A Day 13)  Sertraline Hcl 25 Mg Tabs (Sertraline Hcl) .... Take 1 Tab By Mouth Every Night 14)  Advair Hfa 115-21 Mcg/act Aero (Fluticasone-Salmeterol) .... Inhale 2 Puffs 2 Times A  Day 15)  Doxycycline Hyclate 100 Mg Tabs (Doxycycline Hyclate) .... Take 1 Tablet By Mouth Two Times A Day 16)  Prednisone 20 Mg Tabs (Prednisone) .... Take 3 Tabs For 3 Days, Then 2 Tabs For 2 Days, 1 Tab For Next 2 Days and 1/2 Tab For Next 2 Days and Then Stop.  Current Medications (verified): 1)  Ventolin Hfa 108 (90 Base) Mcg/act Aers (Albuterol Sulfate) .... 2 Puffs Inhaled Every 4-6 Hours As Needed 2)  Fish Oil 1360 Mg Caps (Omega-3 Fatty Acids) .... Take 1 Tablet By Mouth Once A Day 3)  Aspirin 81 Mg Tabs (Aspirin) .... Take One Tablet By Mouth Daily 4)  Cod Liver Oil 1000 Mg Caps (Cod Liver Oil) 5)  Vitamin B-6 250 Mg Tabs (Pyridoxine Hcl) .... Take Two Tables By Mouth Daily 6)  Hydrocortisone 2.5 % Oint (Hydrocortisone) .... Apply Thin Layer Over Area of Itching Twice A Day 7)  Synthroid 50 Mcg Tabs (Levothyroxine Sodium) .... Take 1 Tablet By Mouth Once A Day 8)  Lisinopril 5 Mg Tabs (Lisinopril) .... Take 1 Tablet By Mouth Once A Day 9)  Amitriptyline Hcl 25 Mg Tabs (Amitriptyline Hcl) .... Take 1 Tablet By Mouth At Bedtime For One Week and Then Stop 10)  Omeprazole 40 Mg Cpdr (Omeprazole) .... Take 1 Tablet By Mouth Two Times A Day 11)  Pravachol 40 Mg Tabs (Pravastatin Sodium) .... Take 1 Tablet By Mouth Once A Day 12)  Singulair 10 Mg Tabs (Montelukast Sodium) .... Take 1 Tablet By Mouth Once A Day 13)  Sertraline Hcl 25 Mg Tabs (Sertraline Hcl) .... Take 1 Tab By Mouth Every Night 14)  Advair Hfa 115-21 Mcg/act Aero (Fluticasone-Salmeterol) .... Inhale 2 Puffs 2 Times A Day  Allergies: 1)  ! Vicodin  Past History:  Past Medical History: Last updated: 04/27/2008 Carpal Tunnel syndrome (right hand surgery May 31, 1996, left hand surgery 31-May-1997) Allergic rhinitis Asthma Depression GERD Hyperlipidemia Hypothyroidism Psoriasis   Family History: Last updated: 04/27/2008 Family History Diabetes 1st degree relative Family History Hypertension Family History of Father died of  Suicide (gunshot)  in 05-31-1996  Social History: Last updated: 08/24/2008 Married Current Smoker- one pack per day Alcohol use-no Drug use-no  Risk Factors: Exercise: yes (01/31/2009)  Risk Factors: Smoking Status: quit (07/16/2009) Packs/Day: 0.5 (07/16/2009)  Family History: Reviewed history from 04/27/2008 and no changes required. Family History Diabetes 1st degree relative Family History Hypertension Family History of Father died of Suicide (gunshot)  in May 31, 1996  Social History: Reviewed history from 08/24/2008 and no changes required. Married Current Smoker- one pack per day Alcohol use-no Drug use-no  Review of Systems  The patient denies fever, chest pain, dyspnea on exertion, peripheral edema, headaches, hemoptysis, abdominal pain, melena, and hematochezia.    Physical Exam  General:  alert, well-developed,  and well-nourished.   Mouth:  MMM Neck:  supple.   Lungs:  normal respiratory effort, no intercostal retractions, no accessory muscle use, and normal breath sounds.   Heart:  normal rate and regular rhythm.   Abdomen:  Obese.  soft, non-tender, normal bowel sounds, and no distention.   Msk:  empty can test positive on right side, ttp anterior aspects of right shoulder Pulses:  2+ pulses throughout Extremities:  no edema. Neurologic:  Nonfocal   Impression & Recommendations:  Problem # 1:  PAIN IN JOINT, SHOULDER REGION (ICD-719.41)  Most likely rotator cuff tendinopathy, no evidence of rotator cuff tear on exam. After written consent was obtained, pt was placed in appropriate position. Landmarks identified by palpation. Skin was sterilized with iodine and alcohol. Numbing spray was applied to the injection site. The subacromial space was penetrated easily with an 22 gauge 1 1/2" needle. 27m of Kenalog (446mmL) and 88m43mf 1% lidocaine were injected in the joint w/o resistance. Pressure applied to injection site. Sterile dressing placed. Procedure well tolerated.  No complications.   Her updated medication list for this problem includes:    Aspirin 81 Mg Tabs (Aspirin) ....Marland Kitchen Take one tablet by mouth daily  Orders: Joint Aspirate / Injection, Large (20610)  Problem # 2:  HYPOTHYROIDISM (ICD-244.9) TSH elevated (7.9). Will increase Synthroid and recheck TSH in 6 weeks.   Her updated medication list for this problem includes:    Synthroid 75 Mcg Tabs (Levothyroxine sodium) ....Marland Kitchen Take 1 tablet by mouth once a day  Orders: T-TSH (84([id number] Reviewed: TSH: 25.800 (01/17/2009)    HgBA1c: 6.1 (06/27/2008) Chol: 214 (01/17/2009)   HDL: 34 (01/17/2009)   LDL: 148 (01/17/2009)   TG: 162 (01/17/2009)  Problem # 3:  HYPERTENSION (ICD-401.9) Controlled. Continue current regimen.  Her updated medication list for this problem includes:    Lisinopril 5 Mg Tabs (Lisinopril) ....Marland Kitchen Take 1 tablet by mouth once a day  BP today: 137/75 Prior BP: 118/87 (05/29/2009)  Labs Reviewed: K+: 4.8 (01/17/2009) Creat: : 0.68 (01/17/2009)   Chol: 214 (01/17/2009)   HDL: 34 (01/17/2009)   LDL: 148 (01/17/2009)   TG: 162 (01/17/2009)  Problem # 4:  HYPERLIPIDEMIA (ICD-272.4) Recheck FLP and cmet on follow up.   Her updated medication list for this problem includes:    Pravachol 40 Mg Tabs (Pravastatin sodium) ....Marland Kitchen Take 1 tablet by mouth once a day  Labs Reviewed: SGOT: 24 (01/17/2009)   SGPT: 30 (01/17/2009)   HDL:34 (01/17/2009), 32 (04/28/2008)  LDL:148 (01/17/2009), 148 (04/28/2008)  Chol:214 (01/17/2009), 231 (04/28/2008)  Trig:162 (01/17/2009), 254 (04/28/2008)  Problem # 5:  ASTHMA (ICD-493.90) Stable. continue current regimen.  The following medications were removed from the medication list:    Prednisone 20 Mg Tabs (Prednisone) ....Marland Kitchen Take 3 tabs for 3 days, then 2 tabs for 2 days, 1 tab for next 2 days and 1/2 tab for next 2 days and then stop. Her updated medication list for this problem includes:    Ventolin Hfa 108 (90 Base) Mcg/act Aers  (Albuterol sulfate) ....Marland Kitchen 2 puffs inhaled every 4-6 hours as needed    Singulair 10 Mg Tabs (Montelukast sodium) ....Marland Kitchen Take 1 tablet by mouth once a day    Advair Hfa 115-21 Mcg/act Aero (Fluticasone-salmeterol) ..... Inhale 2 puffs 2 times a day  Problem # 6:  DEPRESSION (ICD-311) Stable. No SSI/HSI. Continue current regimen.  Her updated medication list for this problem includes:    Amitriptyline Hcl 25 Mg Tabs (Amitriptyline  hcl) ..... Take 1 tablet by mouth at bedtime for one week and then stop    Sertraline Hcl 25 Mg Tabs (Sertraline hcl) .Marland Kitchen... Take 1 tab by mouth every night  Problem # 7:  TOBACCO ABUSE (ICD-305.1) Encouraged smoking cessation and discussed different methods for smoking cessation.   Problem # 8:  Preventive Health Care (ICD-V70.0) Will discuss Colonoscopy vs Hemoccult cards on follow up.  Complete Medication List: 1)  Ventolin Hfa 108 (90 Base) Mcg/act Aers (Albuterol sulfate) .... 2 puffs inhaled every 4-6 hours as needed 2)  Fish Oil 1360 Mg Caps (Omega-3 fatty acids) .... Take 1 tablet by mouth once a day 3)  Aspirin 81 Mg Tabs (Aspirin) .... Take one tablet by mouth daily 4)  Cod Liver Oil 1000 Mg Caps (Cod liver oil) 5)  Vitamin B-6 250 Mg Tabs (Pyridoxine hcl) .... Take two tables by mouth daily 6)  Hydrocortisone 2.5 % Oint (Hydrocortisone) .... Apply thin layer over area of itching twice a day 7)  Synthroid 75 Mcg Tabs (Levothyroxine sodium) .... Take 1 tablet by mouth once a day 8)  Lisinopril 5 Mg Tabs (Lisinopril) .... Take 1 tablet by mouth once a day 9)  Amitriptyline Hcl 25 Mg Tabs (Amitriptyline hcl) .... Take 1 tablet by mouth at bedtime for one week and then stop 10)  Omeprazole 40 Mg Cpdr (Omeprazole) .... Take 1 tablet by mouth two times a day 11)  Pravachol 40 Mg Tabs (Pravastatin sodium) .... Take 1 tablet by mouth once a day 12)  Singulair 10 Mg Tabs (Montelukast sodium) .... Take 1 tablet by mouth once a day 13)  Sertraline Hcl 25 Mg Tabs  (Sertraline hcl) .... Take 1 tab by mouth every night 14)  Advair Hfa 115-21 Mcg/act Aero (Fluticasone-salmeterol) .... Inhale 2 puffs 2 times a day  Patient Instructions: 1)  Please schedule a follow-up appointment in 3 months. 2)  Ice shoulder at least three times a day for a week. 3)  Take all medication as directed. 4)  You will be called with any abnormalities in the tests scheduled or performed today.  If you don't hear from [place] within a week from when the test was performed, you can assume that your test was normal.  Prescriptions: SYNTHROID 75 MCG TABS (LEVOTHYROXINE SODIUM) Take 1 tablet by mouth once a day  #30 x 2   Entered and Authorized by:   Rudie Meyer MD   Signed by:   Rudie Meyer MD on 07/20/2009   Method used:   Electronically to        [facility] [phone]* (retail)       [address]       [place], [place]  [address]       Ph: [phone]       Fax: [phone]   RxID:   [id number]   Prevention & Chronic Care Immunizations   Influenza vaccine: Fluvax 3+  (11/28/2008)   Influenza vaccine deferral: Deferred  (11/01/2008)    Tetanus booster: Not documented   Td booster deferral: Deferred  (11/01/2008)    Pneumococcal vaccine: Not documented  Colorectal Screening   Hemoccult: Not documented   Hemoccult action/deferral: Deferred  (05/29/2009)    Colonoscopy: Not documented   Colonoscopy action/deferral: Deferred  (05/29/2009)  Other Screening   Pap smear: UNSATISFACTORY FOR EVALUATION.  THE SPECIMEN IS PROCESSED  (07/25/2008)    Mammogram: ASSESSMENT: Negative - BI-RADS 1^MS DIGITAL SCREENING  (12/13/2008)   Mammogram action/deferral: Ordered  (10/18/2008)  Smoking status: quit  (07/16/2009)  Lipids   Total Cholesterol: 214  (01/17/2009)   Lipid panel action/deferral: Lipid Panel ordered   LDL: 148  (01/17/2009)   LDL Direct: Not documented   HDL: 34  (01/17/2009)   Triglycerides: 162  (01/17/2009)    SGOT (AST):  24  (01/17/2009)   BMP action: Ordered   SGPT (ALT): 30  (01/17/2009)   Alkaline phosphatase: 107  (01/17/2009)   Total bilirubin: 0.3  (01/17/2009)    Lipid flowsheet reviewed?: Yes   Progress toward LDL goal: Unchanged  Hypertension   Last Blood Pressure: 137 / 75  (07/16/2009)   Serum creatinine: 0.68  (01/17/2009)   BMP action: Ordered   Serum potassium 4.8  (01/17/2009)    Hypertension flowsheet reviewed?: Yes   Progress toward BP goal: At goal  Self-Management Support :   Personal Goals (by the next clinic visit) :      Personal blood pressure goal: 130/80  (11/28/2008)     Personal LDL goal: 130  (11/28/2008)    Patient will work on the following items until the next clinic visit to reach self-care goals:     Medications and monitoring: take my medicines every day, bring all of my medications to every visit  (07/16/2009)     Eating: drink diet soda or water instead of juice or soda, eat more vegetables, eat baked foods instead of fried foods, eat fruit for snacks and desserts, limit or avoid alcohol  (07/16/2009)     Activity: take a 30 minute walk every day  (07/16/2009)     Other: yard work  (01/18/2009)    Hypertension self-management support: Education handout, Engineer, technical sales, Written self-care plan  (07/16/2009)   Hypertension self-care plan printed.   Hypertension education handout printed    Lipid self-management support: Written self-care plan, Education handout, Pre-printed educational material  (07/16/2009)   Lipid self-care plan printed.   Lipid education handout printed  Process Orders Check Orders Results:     [facility]: ABN not required for this insurance Tests Sent for requisitioning (Jul 20, 2009 12:19 PM):     07/16/2009: [facility] -- T-TSH [phone] (signed)     Vital Signs:  Patient profile:   50 year old female Height:      63 inches (160.02 cm) Weight:      275 pounds (125.00  kg) BMI:     48.89 Temp:     98.1 degrees F (36.72 degrees C) oral Pulse rate:   99 / minute BP sitting:   137 / 75  (left arm)  Vitals Entered By: Sander Nephew RN (Jul 16, 2009 3:44 PM)

## 2010-04-11 NOTE — Consult Note (Signed)
Summary: [facility]   Imported By: Lacy Duverney 01/09/2010 16:27:39  _____________________________________________________________________  External Attachment:    Type:   Image     Comment:   External Document  Appended Document: [place] Repeat Colonoscopy in 5 years.

## 2010-04-19 ENCOUNTER — Encounter: Payer: Self-pay | Admitting: Internal Medicine

## 2010-04-19 ENCOUNTER — Ambulatory Visit (INDEPENDENT_AMBULATORY_CARE_PROVIDER_SITE_OTHER): Payer: Self-pay | Admitting: Internal Medicine

## 2010-04-19 DIAGNOSIS — M25519 Pain in unspecified shoulder: Secondary | ICD-10-CM

## 2010-04-19 DIAGNOSIS — I10 Essential (primary) hypertension: Secondary | ICD-10-CM

## 2010-04-19 MED ORDER — SERTRALINE HCL 25 MG PO TABS: 25.0000 mg | ORAL_TABLET | Freq: Every evening | ORAL | Status: DC

## 2010-04-19 MED ORDER — OMEPRAZOLE 40 MG PO CPDR: 40.0000 mg | DELAYED_RELEASE_CAPSULE | Freq: Two times a day (BID) | ORAL | Status: DC

## 2010-04-19 MED ORDER — HYDROCODONE-ACETAMINOPHEN 5-500 MG PO TABS: 1.0000 | ORAL_TABLET | ORAL | Status: AC | PRN

## 2010-04-19 NOTE — Patient Instructions (Signed)
Please make sure to make an appointment with sports medicine within the next 1-2 weeks. Whenever you have shoulder pain, pls make sure to call their office first. Let us know if you have any questions or concerns.

## 2010-04-19 NOTE — Progress Notes (Signed)
  Subjective:    Patient ID: Kendra Adams, female    DOB: November 19, 1960, 50 y.o.   MRN: [id number]  HPI Pt is a 50 y/o woman with pmh of COPD, obesity, HTN, HL, a host of other co morbidities as outlined in her chart, as well as rotator cuff syndrome presenting with right shoulder pain. She is actually being followed by Dr. Nori Riis of sports medicine for this very problem and has been undergoing conservative management that has also included joint injections. However, she feels that her pain isn't being well controlled so decided to come into the clinic for further evaluation. Otherwise, she has no other complaints today.   Review of Systems  Constitutional: Negative for fever and unexpected weight change.  Respiratory: Negative for cough and shortness of breath.   Cardiovascular: Negative for chest pain and palpitations.  Genitourinary: Negative for dysuria and frequency.  Musculoskeletal: Negative for back pain, joint swelling and gait problem.  Neurological: Negative for weakness.       Objective:   Physical Exam  Constitutional: She is oriented to person, place, and time. She appears well-developed and well-nourished. No distress.  Cardiovascular: Normal rate, regular rhythm and normal heart sounds.  Exam reveals no gallop and no friction rub.   No murmur heard. Pulmonary/Chest: Effort normal and breath sounds normal. No respiratory distress. She has no wheezes. She has no rales.  Abdominal: Soft. Bowel sounds are normal. There is no tenderness.  Musculoskeletal:       Right shoulder: ROM significantly limited by pain, especially on abduction. No joint swelling or erythema or warmth noted. Mildly tender to palpation.  Neurological: She is alert and oriented to person, place, and time.  Psychiatric: She has a normal mood and affect.          Assessment & Plan:

## 2010-04-24 NOTE — Assessment & Plan Note (Addendum)
2/2 Rotator cuff syndrome. Followed by sports medicine, conservative management so far. Pain not optimally controlled. For now, will treat symptomatically with a short course of Vicodin. Follow up with sports medicine as needed.

## 2010-04-24 NOTE — Assessment & Plan Note (Signed)
Very well controlled. Blood pressure 121/82 today. On Lisinopril, most recent labs in December 2011, electrolytes wnl. Continue current regimen.

## 2010-05-08 ENCOUNTER — Ambulatory Visit: Payer: Self-pay | Admitting: Family Medicine

## 2010-05-22 ENCOUNTER — Encounter: Payer: Self-pay | Admitting: Family Medicine

## 2010-05-22 ENCOUNTER — Ambulatory Visit (INDEPENDENT_AMBULATORY_CARE_PROVIDER_SITE_OTHER): Payer: Self-pay | Admitting: Family Medicine

## 2010-05-22 DIAGNOSIS — M25519 Pain in unspecified shoulder: Secondary | ICD-10-CM

## 2010-05-28 NOTE — Assessment & Plan Note (Signed)
Summary: RT SHOULDER PAIN,[facility] [phone]   Vital Signs:  Patient profile:   50 year old female BP sitting:   125 / 67  Vitals Entered By: Kendra Adams CMA (May 22, 2010 3:20 PM)  Primary Provider:  Kathlen Brunswick MD   History of Present Illness: 50 yo F f/u Rt shoulder pain, s/p CSI 3 months ago, got no relief.  Now pain is on the Lt side as well x 1-2 months.  Had to get vicodin from IM clinic 2 months ago, though I see no record of this in the chart.  Tried theraband exercises that made pain worse per her. Has not had x rays before. Pain worst with lifting in front of her. Has to sleep on back, unable to sleep on either side. Unable to hook bra in back. Denies F/S/C. She thinks there may be some rheum disease in her family.  Allergies: 1)  ! Vicodin 2)  ! Tramadol Hcl  Physical Exam  General:  overweight-appearing.   Msk:  Rt Shoulder: Inspection reveals no abnormalities, atrophy or asymmetry. Palpation is normal with mild  tenderness over bicipital groove, no ttp over SSP ROM is full in all planes. Rotator cuff strength 4+/5 on empty can, IR, ER. Mild impingement with Hawkin's tests, neg neer's. Speeds and Yergason's tests normal. No labral pathology noted with negative Obrien's, negative clunk and good stability. Normal scapular function observed. No painful arc and no drop arm sign. No apprehension sign  Lt Shoulder: Inspection reveals no abnormalities, atrophy or asymmetry. Palpation is normal with no tenderness over AC joint or bicipital groove. ROM is full in all planes. Rotator cuff strength normal throughout. No signs of impingement with negative Neer and Hawkin's tests, empty can. Speeds and Yergason's tests normal. No labral pathology noted with negative Obrien's, negative clunk and good stability. Normal scapular function observed. No painful arc and no drop arm sign. No apprehension sign  B/l hands: hypertrophy and synovitis of b/l 1st PIP  joints and obvious nodules on 1st DIP joints. No true finger or wrist ulnar deviation.   Impression & Recommendations:  Problem # 1:  PAIN IN JOINT, SHOULDER REGION (ICD-719.41) Assessment Deteriorated Persistent on Rt shoulder resistant to CSI and theraband (though I'm unsure how much effort she gave), but now spread to Lt shoulder as well.  No sign of frozen shoulder.  I am concerned based on this hx about some rheum/inflammtory condition.  Reviewing her chart, sounds like she has had shoulder pain for 4+ years now. - check b/l 3-V shoulders - rheum labs (CBC, CMP, Uric Acid, CRP, ESR, ANA, RF, Anti-CCP) - Mobic for pain - May benefit from [facility] [place] of shoulders, but unable to do 2/2 time costraints today as patient showed up 15-30 minutes late for her appt. - Stop home PT for now as doesn't seem to be tolerating - Will call pt with xray and lab results, based on these will determine management - Would likely benefit from formal PT, but deferred for now  Her updated medication list for this problem includes:    Aspirin 81 Mg Tabs (Aspirin) .Marland Kitchen... Take one tablet by mouth daily    Mobic 15 Mg Tabs (Meloxicam) .Marland Kitchen... Take 1 by mouth once daily with food  Orders: CBC-[facility] (80321) Comp Met-[facility] [id number]) CRP, high sensitivity-[facility] ([phone]) Sed Rate (ESR)-[facility] [phone]) ANA-[facility] [phone]) Rheum Fact-[facility] (91505) Uric Acid-[facility] ([id number]) Miscellaneous Lab Charge-[facility] (53748) Diagnostic X-Ray/Fluoroscopy (Diagnostic X-Ray/Flu)  Complete Medication List: 1)  Ventolin Hfa 108 (90 Base) Mcg/act  Aers (Albuterol sulfate) .... 2 puffs inhaled every 4-6 hours as needed 2)  Fish Oil 1360 Mg Caps (Omega-3 fatty acids) .... Take 1 tablet by mouth once a day 3)  Aspirin 81 Mg Tabs (Aspirin) .... Take one tablet by mouth daily 4)  Cod Liver Oil 1000 Mg Caps (Cod liver oil) 5)  Vitamin B-6 250 Mg Tabs (Pyridoxine hcl) .... Take two tables by mouth daily 6)  Hydrocortisone 2.5 % Oint  (Hydrocortisone) .... Apply thin layer over area of itching twice a day 7)  Synthroid 100 Mcg Tabs (Levothyroxine sodium) .... Take 1 tablet by mouth daily 8)  Lisinopril 5 Mg Tabs (Lisinopril) .... Take 1 tablet by mouth once a day 9)  Omeprazole 40 Mg Cpdr (Omeprazole) .... Take 1 tablet by mouth two times a day 10)  Pravachol 40 Mg Tabs (Pravastatin sodium) .... Take 1 tablet by mouth once a day 11)  Sertraline Hcl 25 Mg Tabs (Sertraline hcl) .... Take 1 tab by mouth every night 12)  Advair Diskus 250-50 Mcg/dose Aepb (Fluticasone-salmeterol) .... Take 1 puff two times a day 13)  Terbinafine Hcl 250 Mg Tabs (Terbinafine hcl) .... Take 1 tab by mouth daily for 12 weeks 14)  Mobic 15 Mg Tabs (Meloxicam) .... Take 1 by mouth once daily with food Prescriptions: MOBIC 15 MG TABS (MELOXICAM) take 1 by mouth once daily with food  #30 x 0   Entered by:   Caesar Chestnut RN   Authorized by:   Lennie Muckle MD   Signed by:   Caesar Chestnut RN on 05/22/2010   Method used:   Electronically to        [facility] [phone]* (retail)       [address]       [place], [place]  [address]       Ph: [phone]       Fax: [phone]   RxID:   [id number]    Orders Added: 1)  CBC-[facility] [96789] 2)  Comp Met-[facility] [[id number]] 3)  CRP, high sensitivity-[facility] [[id number]] 4)  Sed Rate (ESR)-[facility] [85651] 5)  ANA-[facility] [[id number]] 6)  Rheum Fact-[facility] [40086] 7)  Uric Acid-[facility] [[id number]] 8)  Miscellaneous Lab Charge-[facility] [99999] 9)  Diagnostic X-Ray/Fluoroscopy [Diagnostic X-Ray/Flu] 10)  Est. Patient Level IV [71245]

## 2010-05-29 ENCOUNTER — Ambulatory Visit (HOSPITAL_COMMUNITY)
Admission: RE | Admit: 2010-05-29 | Discharge: 2010-05-29 | Disposition: A | Discharge status: Home / Self Care | Payer: Self-pay | Source: Ambulatory Visit | Attending: Family Medicine | Admitting: Family Medicine

## 2010-05-29 ENCOUNTER — Other Ambulatory Visit: Payer: Self-pay

## 2010-05-29 ENCOUNTER — Other Ambulatory Visit: Payer: Self-pay | Admitting: Family Medicine

## 2010-05-29 DIAGNOSIS — M19019 Primary osteoarthritis, unspecified shoulder: Secondary | ICD-10-CM | POA: Insufficient documentation

## 2010-05-29 DIAGNOSIS — R52 Pain, unspecified: Secondary | ICD-10-CM

## 2010-05-29 DIAGNOSIS — M25519 Pain in unspecified shoulder: Secondary | ICD-10-CM

## 2010-05-29 LAB — COMPREHENSIVE METABOLIC PANEL
ALT: 21 U/L (ref 0–35)
AST: 18 U/L (ref 0–37)
Albumin: 4.2 g/dL (ref 3.5–5.2)
Alkaline Phosphatase: 108 U/L (ref 39–117)
BUN: 15 mg/dL (ref 6–23)
CO2: 24 mEq/L (ref 19–32)
Calcium: 9.8 mg/dL (ref 8.4–10.5)
Chloride: 104 mEq/L (ref 96–112)
Creat: 0.62 mg/dL (ref 0.40–1.20)
Glucose, Bld: 179 mg/dL — ABNORMAL HIGH (ref 70–99)
Potassium: 4 mEq/L (ref 3.5–5.3)
Sodium: 139 mEq/L (ref 135–145)
Total Bilirubin: 0.3 mg/dL (ref 0.3–1.2)
Total Protein: 6.8 g/dL (ref 6.0–8.3)

## 2010-05-29 LAB — URIC ACID: Uric Acid, Serum: 4 mg/dL (ref 2.4–7.0)

## 2010-05-30 LAB — ANTI-NEUTROPHIL ANTIBODY
Atypical p-ANCA Screen: NEGATIVE
c-ANCA Screen: NEGATIVE
p-ANCA Screen: NEGATIVE

## 2010-05-30 LAB — CBC
HCT: 43.6 % (ref 36.0–46.0)
Hemoglobin: 14.4 g/dL (ref 12.0–15.0)
MCH: 31.2 pg (ref 26.0–34.0)
MCHC: 33 g/dL (ref 30.0–36.0)
MCV: 94.4 fL (ref 78.0–100.0)
Platelets: 284 10*3/uL (ref 150–400)
RBC: 4.62 MIL/uL (ref 3.87–5.11)
RDW: 13.5 % (ref 11.5–15.5)
WBC: 14 10*3/uL — ABNORMAL HIGH (ref 4.0–10.5)

## 2010-05-30 LAB — HIGH SENSITIVITY CRP: CRP, High Sensitivity: 8.1 mg/L — ABNORMAL HIGH

## 2010-05-30 LAB — ANA: Anti Nuclear Antibody(ANA): NEGATIVE

## 2010-05-30 LAB — MPO/PR-3 (ANCA) ANTIBODIES
Myeloperoxidase Abs: <1 AU/mL (ref <20)
Serine Protease 3: 1 AU/mL (ref <20)

## 2010-05-30 LAB — SEDIMENTATION RATE: Sed Rate: 8 mm/hr (ref 0–22)

## 2010-05-30 LAB — RHEUMATOID FACTOR: Rhuematoid fact SerPl-aCnc: <10 IU/mL (ref <=14)

## 2010-06-03 ENCOUNTER — Encounter: Payer: Self-pay | Admitting: Family Medicine

## 2010-06-05 ENCOUNTER — Emergency Department (HOSPITAL_COMMUNITY)
Admission: EM | Admit: 2010-06-05 | Discharge: 2010-06-06 | Disposition: A | Discharge status: Home / Self Care | Payer: Self-pay | Attending: Emergency Medicine | Admitting: Emergency Medicine

## 2010-06-05 DIAGNOSIS — R0989 Other specified symptoms and signs involving the circulatory and respiratory systems: Secondary | ICD-10-CM | POA: Insufficient documentation

## 2010-06-05 DIAGNOSIS — F3289 Other specified depressive episodes: Secondary | ICD-10-CM | POA: Insufficient documentation

## 2010-06-05 DIAGNOSIS — R070 Pain in throat: Secondary | ICD-10-CM | POA: Insufficient documentation

## 2010-06-05 DIAGNOSIS — R059 Cough, unspecified: Secondary | ICD-10-CM | POA: Insufficient documentation

## 2010-06-05 DIAGNOSIS — R0982 Postnasal drip: Secondary | ICD-10-CM | POA: Insufficient documentation

## 2010-06-05 DIAGNOSIS — E669 Obesity, unspecified: Secondary | ICD-10-CM | POA: Insufficient documentation

## 2010-06-05 DIAGNOSIS — R0609 Other forms of dyspnea: Secondary | ICD-10-CM | POA: Insufficient documentation

## 2010-06-05 DIAGNOSIS — F329 Major depressive disorder, single episode, unspecified: Secondary | ICD-10-CM | POA: Insufficient documentation

## 2010-06-05 DIAGNOSIS — K219 Gastro-esophageal reflux disease without esophagitis: Secondary | ICD-10-CM | POA: Insufficient documentation

## 2010-06-05 DIAGNOSIS — R05 Cough: Secondary | ICD-10-CM | POA: Insufficient documentation

## 2010-06-05 DIAGNOSIS — J309 Allergic rhinitis, unspecified: Secondary | ICD-10-CM | POA: Insufficient documentation

## 2010-06-05 DIAGNOSIS — E039 Hypothyroidism, unspecified: Secondary | ICD-10-CM | POA: Insufficient documentation

## 2010-06-05 DIAGNOSIS — R0602 Shortness of breath: Secondary | ICD-10-CM | POA: Insufficient documentation

## 2010-06-05 DIAGNOSIS — J45909 Unspecified asthma, uncomplicated: Secondary | ICD-10-CM | POA: Insufficient documentation

## 2010-06-06 ENCOUNTER — Emergency Department (HOSPITAL_COMMUNITY): Payer: Self-pay

## 2010-06-10 LAB — RAPID STREP SCREEN (MED CTR MEBANE ONLY): Streptococcus, Group A Screen (Direct): NEGATIVE

## 2010-06-12 ENCOUNTER — Encounter: Payer: Self-pay | Admitting: Family Medicine

## 2010-06-12 ENCOUNTER — Ambulatory Visit (INDEPENDENT_AMBULATORY_CARE_PROVIDER_SITE_OTHER): Payer: Self-pay | Admitting: Family Medicine

## 2010-06-12 VITALS — BP 126/84 | HR 106

## 2010-06-12 DIAGNOSIS — M25519 Pain in unspecified shoulder: Secondary | ICD-10-CM

## 2010-06-12 MED ORDER — MELOXICAM 15 MG PO TABS: 15.0000 mg | ORAL_TABLET | Freq: Every day | ORAL | Status: DC
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
## 2010-06-13 ENCOUNTER — Telehealth: Payer: Self-pay | Admitting: *Deleted

## 2010-06-13 DIAGNOSIS — R059 Cough, unspecified: Secondary | ICD-10-CM

## 2010-06-13 DIAGNOSIS — R05 Cough: Secondary | ICD-10-CM

## 2010-06-13 MED ORDER — HYDROCODONE-HOMATROPINE 5-1.5 MG/5ML PO SYRP: 5.0000 mL | ORAL_SOLUTION | ORAL | Status: AC | PRN

## 2010-06-13 NOTE — Telephone Encounter (Signed)
Rx called in and pt informed

## 2010-06-13 NOTE — Telephone Encounter (Signed)
Pt called to report she was seen in ED 1 1/2 weeks ago for cough.  She  was given prednisone which has helped. She still has productive cough. Pt still smoking small amount, almost quit.   She is asking for cough meds, she used inhaler every 3 - 4 hours. Kristopher Oppenheim on [address]. Pt # [id number]

## 2010-06-13 NOTE — Telephone Encounter (Signed)
Call in hycodan cough syrup, 8 oz, 5 cc q 4-6 hrs prn no rf

## 2010-06-20 ENCOUNTER — Ambulatory Visit (INDEPENDENT_AMBULATORY_CARE_PROVIDER_SITE_OTHER): Payer: Self-pay | Admitting: Internal Medicine

## 2010-06-20 ENCOUNTER — Encounter: Payer: Self-pay | Admitting: Internal Medicine

## 2010-06-20 VITALS — BP 131/78 | HR 96 | Temp 98.1°F | Ht 63.0 in | Wt 257.7 lb

## 2010-06-20 DIAGNOSIS — M25519 Pain in unspecified shoulder: Secondary | ICD-10-CM

## 2010-06-20 DIAGNOSIS — S20219A Contusion of unspecified front wall of thorax, initial encounter: Secondary | ICD-10-CM | POA: Insufficient documentation

## 2010-06-20 DIAGNOSIS — J45909 Unspecified asthma, uncomplicated: Secondary | ICD-10-CM

## 2010-06-20 DIAGNOSIS — I10 Essential (primary) hypertension: Secondary | ICD-10-CM

## 2010-06-20 MED ORDER — LEVOTHYROXINE SODIUM 100 MCG PO TABS: 100.0000 ug | ORAL_TABLET | Freq: Every day | ORAL | Status: DC

## 2010-06-20 MED ORDER — BUDESONIDE 0.25 MG/2ML IN SUSP: 0.2500 mg | Freq: Every day | RESPIRATORY_TRACT | Status: DC

## 2010-06-20 MED ORDER — ACETAMINOPHEN-CODEINE 300-30 MG PO TABS: 1.0000 | ORAL_TABLET | Freq: Four times a day (QID) | ORAL | Status: DC | PRN

## 2010-06-20 MED ORDER — LISINOPRIL 5 MG PO TABS: 5.0000 mg | ORAL_TABLET | Freq: Every day | ORAL | Status: DC

## 2010-06-20 NOTE — Assessment & Plan Note (Signed)
Her pressure continues to be well controlled. No changes were made today.

## 2010-06-20 NOTE — Assessment & Plan Note (Signed)
Patient reports that she has psoriasis. I am unable to find evidence of such diagnosis. I am unclear if she had this diagnosis given to her by her physicians which she was going to recommend to her clinic. Her arthropathy may be related to the psoriasis as was her skin lesions. She might need further workup for rheumatological diseases when she is better.

## 2010-06-20 NOTE — Patient Instructions (Signed)
Return in two weeks.

## 2010-06-20 NOTE — Assessment & Plan Note (Signed)
I can't locate her function test on this patient. Patient does not have orange cordis she doesn't qualify for it. She says that her husband make too much money to qualify for it. Patient is unable to forward inhalers we are patient uses albuterol inhaler throughout the day. Her try to find steroid controller inhaler for her which would be reasonable cost and not succeeded. I prescribed her nebulizing solution for she will with the hope that she may be able to afford that. She's advised to take it once a day to control her symptoms. Especially during the spring season her breathing gets worse because of her probable allergic component to her asthma.
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
## 2010-06-24 LAB — POCT URINALYSIS DIP (DEVICE)
Glucose, UA: NEGATIVE mg/dL
Hgb urine dipstick: NEGATIVE
Nitrite: NEGATIVE
Protein, ur: 100 mg/dL — AB
Specific Gravity, Urine: 1.02 (ref 1.005–1.030)
Urobilinogen, UA: 1 mg/dL (ref 0.0–1.0)
pH: 6 (ref 5.0–8.0)

## 2010-06-24 LAB — URINE CULTURE
Colony Count: NO GROWTH
Culture: NO GROWTH

## 2010-06-24 LAB — POCT PREGNANCY, URINE: Preg Test, Ur: NEGATIVE

## 2010-06-25 LAB — POCT I-STAT, CHEM 8
BUN: 11 mg/dL (ref 6–23)
Calcium, Ion: 1.22 mmol/L (ref 1.12–1.32)
Chloride: 103 mEq/L (ref 96–112)
Creatinine, Ser: 0.9 mg/dL (ref 0.4–1.2)
Glucose, Bld: 133 mg/dL — ABNORMAL HIGH (ref 70–99)
HCT: 46 % (ref 36.0–46.0)
Hemoglobin: 15.6 g/dL — ABNORMAL HIGH (ref 12.0–15.0)
Potassium: 4.1 mEq/L (ref 3.5–5.1)
Sodium: 141 mEq/L (ref 135–145)
TCO2: 29 mmol/L (ref 0–100)

## 2010-06-25 LAB — URINE CULTURE: Colony Count: 30000

## 2010-06-25 LAB — URINALYSIS, ROUTINE W REFLEX MICROSCOPIC
Bilirubin Urine: NEGATIVE
Glucose, UA: NEGATIVE mg/dL
Hgb urine dipstick: NEGATIVE
Ketones, ur: NEGATIVE mg/dL
Nitrite: NEGATIVE
Protein, ur: NEGATIVE mg/dL
Specific Gravity, Urine: 1.023 (ref 1.005–1.030)
Urobilinogen, UA: 0.2 mg/dL (ref 0.0–1.0)
pH: 6.5 (ref 5.0–8.0)

## 2010-06-25 LAB — POCT PREGNANCY, URINE: Preg Test, Ur: NEGATIVE

## 2010-06-27 ENCOUNTER — Ambulatory Visit (INDEPENDENT_AMBULATORY_CARE_PROVIDER_SITE_OTHER): Payer: Self-pay | Admitting: Ophthalmology

## 2010-06-27 DIAGNOSIS — J441 Chronic obstructive pulmonary disease with (acute) exacerbation: Secondary | ICD-10-CM

## 2010-06-27 DIAGNOSIS — F329 Major depressive disorder, single episode, unspecified: Secondary | ICD-10-CM

## 2010-06-27 DIAGNOSIS — J309 Allergic rhinitis, unspecified: Secondary | ICD-10-CM

## 2010-06-27 DIAGNOSIS — S20219A Contusion of unspecified front wall of thorax, initial encounter: Secondary | ICD-10-CM

## 2010-06-27 MED ORDER — SALINE NASAL SPRAY 0.65 % NA SOLN: 1.0000 | NASAL | Status: DC | PRN

## 2010-06-27 MED ORDER — BENZONATATE 100 MG PO CAPS: 100.0000 mg | ORAL_CAPSULE | Freq: Four times a day (QID) | ORAL | Status: DC | PRN

## 2010-06-27 MED ORDER — FLUTICASONE-SALMETEROL 250-50 MCG/DOSE IN AEPB: 1.0000 | INHALATION_SPRAY | Freq: Two times a day (BID) | RESPIRATORY_TRACT | Status: DC

## 2010-06-27 MED ORDER — SERTRALINE HCL 25 MG PO TABS: 50.0000 mg | ORAL_TABLET | Freq: Every evening | ORAL | Status: DC

## 2010-06-27 NOTE — Assessment & Plan Note (Signed)
The patient has a history of allergic rhinitis, and I think it is likely that postnasal drip in combination with her COPD is likely responsible for her cough, I do not suspect a pneumonia or others more serious etiology at this time. At this point, I will prescribe the patient Tessalon Perles for cough suppression, as well as nasal saline spray for symptomatic relief of her postnasal drip.

## 2010-06-27 NOTE — Assessment & Plan Note (Signed)
Unfortunately, the patient has only been able to afford her albuterol inhalers, and needs a controlling medication. I will give the patient a sample of Advair today, with a plan for her to discuss the matter with Clarice Pole to determine whether or not she is eligible for any financial assistance. I do think that the patient's COPD is in part contributing to her cough over the last several weeks.
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
## 2010-06-27 NOTE — Assessment & Plan Note (Signed)
The patient's bruises are improving, but she continues to have some mild chest pain to palpation and with deep coughing.

## 2010-06-27 NOTE — Assessment & Plan Note (Signed)
This is under poor control, will increase the patient's sertraline from 25 mg daily to 50 mg today, with a plan for the patient to followup in 3-4 weeks for further assessment of her depression.

## 2010-06-27 NOTE — Patient Instructions (Signed)
Please plan to see Kendra Adams to try to get medication assistance for her inhalers, I like to see you again in 3-4 weeks to followup on your depression symptoms. Please call earlier if needed, and if you note worsening depression or thoughts of suicide please contact the clinic or go to the ED immediately.

## 2010-07-04 ENCOUNTER — Emergency Department (HOSPITAL_COMMUNITY): Payer: Self-pay

## 2010-07-04 ENCOUNTER — Emergency Department (HOSPITAL_COMMUNITY)
Admission: EM | Admit: 2010-07-04 | Discharge: 2010-07-04 | Disposition: A | Discharge status: Home / Self Care | Payer: Self-pay | Attending: Emergency Medicine | Admitting: Emergency Medicine

## 2010-07-04 DIAGNOSIS — Y92009 Unspecified place in unspecified non-institutional (private) residence as the place of occurrence of the external cause: Secondary | ICD-10-CM | POA: Insufficient documentation

## 2010-07-04 DIAGNOSIS — E039 Hypothyroidism, unspecified: Secondary | ICD-10-CM | POA: Insufficient documentation

## 2010-07-04 DIAGNOSIS — W1809XA Striking against other object with subsequent fall, initial encounter: Secondary | ICD-10-CM | POA: Insufficient documentation

## 2010-07-04 DIAGNOSIS — J45909 Unspecified asthma, uncomplicated: Secondary | ICD-10-CM | POA: Insufficient documentation

## 2010-07-04 DIAGNOSIS — E669 Obesity, unspecified: Secondary | ICD-10-CM | POA: Insufficient documentation

## 2010-07-04 DIAGNOSIS — R0789 Other chest pain: Secondary | ICD-10-CM | POA: Insufficient documentation

## 2010-07-10 ENCOUNTER — Encounter: Payer: Self-pay | Admitting: Family Medicine

## 2010-07-10 ENCOUNTER — Ambulatory Visit (INDEPENDENT_AMBULATORY_CARE_PROVIDER_SITE_OTHER): Payer: Self-pay | Admitting: Family Medicine

## 2010-07-10 VITALS — BP 136/89

## 2010-07-10 DIAGNOSIS — S8000XA Contusion of unspecified knee, initial encounter: Secondary | ICD-10-CM | POA: Insufficient documentation

## 2010-07-10 MED ORDER — CEPHALEXIN 500 MG PO CAPS: 500.0000 mg | ORAL_CAPSULE | Freq: Three times a day (TID) | ORAL | Status: AC

## 2010-07-10 MED ORDER — CEPHALEXIN 500 MG PO CAPS: 500.0000 mg | ORAL_CAPSULE | Freq: Three times a day (TID) | ORAL | Status: DC
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
## 2010-07-16 ENCOUNTER — Telehealth: Payer: Self-pay | Admitting: Licensed Clinical Social Worker

## 2010-07-16 ENCOUNTER — Encounter: Payer: Self-pay | Admitting: Internal Medicine

## 2010-07-16 NOTE — Telephone Encounter (Signed)
Left message for pt to call Social Work.  Also called Marlana Latus to see where she was with certification.
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
## 2010-07-18 NOTE — Telephone Encounter (Signed)
The patient was also encouraged to consult with Marlana Latus regarding outstanding bills as she would like to make payments and also to meet with social work at next appointment.

## 2010-07-26 ENCOUNTER — Telehealth: Payer: Self-pay | Admitting: *Deleted

## 2010-07-26 NOTE — Telephone Encounter (Signed)
Pt calls to ask for abx, states she has been coughing but this morning she coughed up green/ [place] thick mucous, she would like abx called in. Pt is informed that she would have to be seen for this and is offered an appt for early next week, she is agreeable and is transferred to scheduling

## 2010-07-29 ENCOUNTER — Encounter: Payer: Self-pay | Admitting: Internal Medicine

## 2010-07-29 ENCOUNTER — Ambulatory Visit (INDEPENDENT_AMBULATORY_CARE_PROVIDER_SITE_OTHER): Payer: Self-pay | Admitting: Internal Medicine

## 2010-07-29 VITALS — BP 147/98 | HR 74 | Temp 97.4°F | Ht 67.0 in | Wt 257.2 lb

## 2010-07-29 DIAGNOSIS — J309 Allergic rhinitis, unspecified: Secondary | ICD-10-CM

## 2010-07-29 DIAGNOSIS — J45909 Unspecified asthma, uncomplicated: Secondary | ICD-10-CM

## 2010-07-29 DIAGNOSIS — J40 Bronchitis, not specified as acute or chronic: Secondary | ICD-10-CM

## 2010-07-29 MED ORDER — HYDROCODONE-HOMATROPINE 5-1.5 MG/5ML PO SYRP: 2.5000 mL | ORAL_SOLUTION | Freq: Four times a day (QID) | ORAL | Status: AC | PRN
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
## 2010-07-29 NOTE — Assessment & Plan Note (Signed)
Increased use of inhaler lately. Explain her to quit smoking to prevent worsening of her COPD and preventing asthma exacerbation. She is understanding and says that she will try quit smoking but doesn't need any help for now. Try to counsel her again during next visit if she is still smoking.

## 2010-07-29 NOTE — Patient Instructions (Signed)
Please make a followup appointment in 3-4 months. Please come fasting at that time for about 8 hours so we can check cholesterol levels. Also take the cough syrup as instructed for severe cough and also continue taking over-the-counter allergy medication which you are taking right now. You don't seem to have any infection as of now and so I will avoid giving antibiotic to you due to the side effects and resistance development. Also as was discussed stopping smoking is the best thing for you to prevent this bronchitis and also preventing your COPD getting worse and also will help you using less inhalers and spending less money on medications.

## 2010-07-29 NOTE — Assessment & Plan Note (Signed)
She likely has the cough for due to her allergic rhinitis and chronic bronchitis. She doesn't seem to have any infection clinically considering her baseline on exam, no fever. Would not pursue any radiographic studies for now or will not given antibiotics. Explained to her to continue taking over-the-counter Benadryl and prescribed her Hycodan cough syrup 120 mL without any refills- to help break the vicious cycle of cough.

## 2010-08-02 ENCOUNTER — Telehealth: Payer: Self-pay | Admitting: *Deleted

## 2010-08-02 NOTE — Telephone Encounter (Signed)
Called patient. She's not having any fever, cough is better but sputum production is the same. She was worried about her having a pneumonia because she and her husband both had similar symptoms and her husband got antibiotic from his doctor and he was getting better. As she was afebrile and her cough is getting better, I reassured her and advised her to continue taking Benadryl and cough syrup as considering her COPD we just don't want to create antibiotic resistance in her. She is understanding and she said she will call if needed.

## 2010-08-02 NOTE — Telephone Encounter (Signed)
Pt called and states she was seen in clinic Wednesday and she states Dr did not feel there was infection in lungs and head. Her husband was seen by his doctor and got antibiotics and is doing better. She was given cough meds, which helps but she is asking for something for the infection.  She does not want to come back in and feels like she will get pneumonia if not treated.  She gets this every spring. Pt # [id number]  Please advise.

## 2010-08-08 ENCOUNTER — Emergency Department (HOSPITAL_COMMUNITY)
Admission: EM | Admit: 2010-08-08 | Discharge: 2010-08-08 | Disposition: A | Discharge status: Home / Self Care | Payer: Self-pay | Attending: Emergency Medicine | Admitting: Emergency Medicine

## 2010-08-08 ENCOUNTER — Emergency Department (HOSPITAL_COMMUNITY): Payer: Self-pay

## 2010-08-08 DIAGNOSIS — K219 Gastro-esophageal reflux disease without esophagitis: Secondary | ICD-10-CM | POA: Insufficient documentation

## 2010-08-08 DIAGNOSIS — R000 Tachycardia, unspecified: Secondary | ICD-10-CM | POA: Insufficient documentation

## 2010-08-08 DIAGNOSIS — J069 Acute upper respiratory infection, unspecified: Secondary | ICD-10-CM | POA: Insufficient documentation

## 2010-08-08 DIAGNOSIS — R059 Cough, unspecified: Secondary | ICD-10-CM | POA: Insufficient documentation

## 2010-08-08 DIAGNOSIS — F329 Major depressive disorder, single episode, unspecified: Secondary | ICD-10-CM | POA: Insufficient documentation

## 2010-08-08 DIAGNOSIS — J3489 Other specified disorders of nose and nasal sinuses: Secondary | ICD-10-CM | POA: Insufficient documentation

## 2010-08-08 DIAGNOSIS — Z79899 Other long term (current) drug therapy: Secondary | ICD-10-CM | POA: Insufficient documentation

## 2010-08-08 DIAGNOSIS — R0982 Postnasal drip: Secondary | ICD-10-CM | POA: Insufficient documentation

## 2010-08-08 DIAGNOSIS — E039 Hypothyroidism, unspecified: Secondary | ICD-10-CM | POA: Insufficient documentation

## 2010-08-08 DIAGNOSIS — J40 Bronchitis, not specified as acute or chronic: Secondary | ICD-10-CM | POA: Insufficient documentation

## 2010-08-08 DIAGNOSIS — R0609 Other forms of dyspnea: Secondary | ICD-10-CM | POA: Insufficient documentation

## 2010-08-08 DIAGNOSIS — R0989 Other specified symptoms and signs involving the circulatory and respiratory systems: Secondary | ICD-10-CM | POA: Insufficient documentation

## 2010-08-08 DIAGNOSIS — J45909 Unspecified asthma, uncomplicated: Secondary | ICD-10-CM | POA: Insufficient documentation

## 2010-08-08 DIAGNOSIS — R51 Headache: Secondary | ICD-10-CM | POA: Insufficient documentation

## 2010-08-08 DIAGNOSIS — R05 Cough: Secondary | ICD-10-CM | POA: Insufficient documentation

## 2010-08-08 DIAGNOSIS — F3289 Other specified depressive episodes: Secondary | ICD-10-CM | POA: Insufficient documentation

## 2010-08-08 DIAGNOSIS — J309 Allergic rhinitis, unspecified: Secondary | ICD-10-CM | POA: Insufficient documentation

## 2010-08-14 ENCOUNTER — Other Ambulatory Visit: Payer: Self-pay | Admitting: Sports Medicine

## 2010-08-19 ENCOUNTER — Other Ambulatory Visit: Payer: Self-pay | Admitting: *Deleted

## 2010-08-19 DIAGNOSIS — M25512 Pain in left shoulder: Secondary | ICD-10-CM

## 2010-08-19 DIAGNOSIS — M25511 Pain in right shoulder: Secondary | ICD-10-CM

## 2010-08-22 ENCOUNTER — Telehealth: Payer: Self-pay | Admitting: *Deleted

## 2010-08-22 NOTE — Telephone Encounter (Addendum)
Pt called stating she had not heard from PT yet, and she is requesting pain medication.   Called PT- they said they called her and left message with her husband to return call.   Per Dr. Truman Hayward he could rx for pain, but nothing else. Advised pt of the above- she states tramadol does not help her - she has been on it before.  Per Dr. Truman Hayward PT should help her pain, she should start the sessions and f/u here, he will not prescribe narcotics for this type of pain. Pt agreeable.

## 2010-09-05 ENCOUNTER — Ambulatory Visit: Payer: Self-pay | Attending: Family Medicine | Admitting: Physical Therapy

## 2010-09-05 DIAGNOSIS — M6281 Muscle weakness (generalized): Secondary | ICD-10-CM | POA: Insufficient documentation

## 2010-09-05 DIAGNOSIS — IMO0001 Reserved for inherently not codable concepts without codable children: Secondary | ICD-10-CM | POA: Insufficient documentation

## 2010-09-05 DIAGNOSIS — M2560 Stiffness of unspecified joint, not elsewhere classified: Secondary | ICD-10-CM | POA: Insufficient documentation

## 2010-09-05 DIAGNOSIS — R293 Abnormal posture: Secondary | ICD-10-CM | POA: Insufficient documentation

## 2010-09-17 ENCOUNTER — Ambulatory Visit: Payer: Self-pay | Attending: Family Medicine | Admitting: Physical Therapy

## 2010-09-17 DIAGNOSIS — R293 Abnormal posture: Secondary | ICD-10-CM | POA: Insufficient documentation

## 2010-09-17 DIAGNOSIS — M2560 Stiffness of unspecified joint, not elsewhere classified: Secondary | ICD-10-CM | POA: Insufficient documentation

## 2010-09-17 DIAGNOSIS — IMO0001 Reserved for inherently not codable concepts without codable children: Secondary | ICD-10-CM | POA: Insufficient documentation

## 2010-09-17 DIAGNOSIS — M6281 Muscle weakness (generalized): Secondary | ICD-10-CM | POA: Insufficient documentation

## 2010-09-23 ENCOUNTER — Ambulatory Visit: Payer: Self-pay | Admitting: Rehabilitative and Restorative Service Providers"

## 2010-09-25 ENCOUNTER — Ambulatory Visit: Payer: Self-pay | Admitting: Rehabilitative and Restorative Service Providers"

## 2010-09-30 ENCOUNTER — Ambulatory Visit: Payer: Self-pay | Admitting: Rehabilitative and Restorative Service Providers"

## 2010-10-02 ENCOUNTER — Ambulatory Visit: Payer: Self-pay | Admitting: Rehabilitative and Restorative Service Providers"

## 2010-10-07 ENCOUNTER — Ambulatory Visit: Payer: Self-pay | Admitting: Rehabilitative and Restorative Service Providers"

## 2010-10-07 ENCOUNTER — Other Ambulatory Visit: Payer: Self-pay | Admitting: *Deleted

## 2010-10-07 MED ORDER — BENZONATATE 100 MG PO CAPS: 100.0000 mg | ORAL_CAPSULE | Freq: Four times a day (QID) | ORAL | Status: DC | PRN

## 2010-10-07 NOTE — Telephone Encounter (Signed)
Benzonatate rx called to [facility].

## 2010-10-07 NOTE — Telephone Encounter (Signed)
Last appt 07/29/10.

## 2010-10-09 ENCOUNTER — Ambulatory Visit: Payer: Self-pay | Attending: Family Medicine | Admitting: Rehabilitative and Restorative Service Providers"

## 2010-10-09 DIAGNOSIS — R293 Abnormal posture: Secondary | ICD-10-CM | POA: Insufficient documentation

## 2010-10-09 DIAGNOSIS — IMO0001 Reserved for inherently not codable concepts without codable children: Secondary | ICD-10-CM | POA: Insufficient documentation

## 2010-10-09 DIAGNOSIS — M2560 Stiffness of unspecified joint, not elsewhere classified: Secondary | ICD-10-CM | POA: Insufficient documentation

## 2010-10-09 DIAGNOSIS — M6281 Muscle weakness (generalized): Secondary | ICD-10-CM | POA: Insufficient documentation

## 2010-10-16 ENCOUNTER — Ambulatory Visit: Payer: Self-pay | Admitting: Physical Therapy

## 2010-10-18 ENCOUNTER — Ambulatory Visit: Payer: Self-pay | Admitting: Physical Therapy

## 2010-10-21 ENCOUNTER — Encounter: Payer: Self-pay | Admitting: Physical Therapy

## 2010-10-23 ENCOUNTER — Ambulatory Visit: Payer: Self-pay | Admitting: Physical Therapy

## 2010-10-30 ENCOUNTER — Ambulatory Visit (INDEPENDENT_AMBULATORY_CARE_PROVIDER_SITE_OTHER): Payer: Self-pay | Admitting: Internal Medicine

## 2010-10-30 ENCOUNTER — Encounter: Payer: Self-pay | Admitting: Internal Medicine

## 2010-10-30 ENCOUNTER — Encounter: Payer: Self-pay | Admitting: Rehabilitative and Restorative Service Providers"

## 2010-10-30 VITALS — BP 132/90 | HR 97 | Temp 97.8°F | Resp 20 | Ht 62.5 in | Wt 244.8 lb

## 2010-10-30 DIAGNOSIS — R21 Rash and other nonspecific skin eruption: Secondary | ICD-10-CM

## 2010-10-30 DIAGNOSIS — I10 Essential (primary) hypertension: Secondary | ICD-10-CM

## 2010-10-30 DIAGNOSIS — E039 Hypothyroidism, unspecified: Secondary | ICD-10-CM

## 2010-10-30 DIAGNOSIS — E785 Hyperlipidemia, unspecified: Secondary | ICD-10-CM

## 2010-10-30 LAB — LIPID PANEL
Cholesterol: 234 mg/dL — ABNORMAL HIGH (ref 0–200)
HDL: 31 mg/dL — ABNORMAL LOW (ref >39)
LDL Cholesterol: 155 mg/dL — ABNORMAL HIGH (ref 0–99)
Total CHOL/HDL Ratio: 7.5 Ratio
Triglycerides: 238 mg/dL — ABNORMAL HIGH (ref <150)
VLDL: 48 mg/dL — ABNORMAL HIGH (ref 0–40)

## 2010-10-31 ENCOUNTER — Encounter: Payer: Self-pay | Admitting: Internal Medicine

## 2010-10-31 LAB — TSH: TSH: 9.611 u[IU]/mL — ABNORMAL HIGH (ref 0.350–4.500)

## 2010-10-31 MED ORDER — PRAVASTATIN SODIUM 40 MG PO TABS: 40.0000 mg | ORAL_TABLET | Freq: Every day | ORAL | Status: DC

## 2010-10-31 NOTE — Assessment & Plan Note (Signed)
Macular rash mostly consistent with allergic reaction and currently of unclear etiology. Possibly secondary to work-related exposure. I have advised patient to wash her skin regularly with lukewarm water and gentle soap, avoid changing detergents and soaps. I have also educated her on supportive care with Benadryl over-the-counter as needed for itching either topical or by mouth. I have advised her to call us back or come back to clinic for further evaluation if her symptoms get worse or do not improve over the next several days.

## 2010-10-31 NOTE — Progress Notes (Signed)
  Subjective:    Patient ID: Kendra Adams, female    DOB: 12-Apr-1960, 50 y.o.   MRN: [id number]  HPI  Patient is 50 year old female with past medical history outlined below who presents to clinic for followup on blood pressure, cholesterol, thyroid, and has additional concern about new skin rash she has developed approximately 4 days ago. She is unsure what the provoking factor was but tells me she has been working in the garden and her job is Biomedical scientist. In addition she has been taking care of friends dogs and was bathing them and is not sure if it could be related to the rash. Her rash is located around the stomach area as well as thighs, she tells me it is not spreading over the past 2-3 days, she notices multiple small red dots, no blisters, no pus or blood noted, itching slightly and mostly at nighttime. She has had similar episodes of skin rash in the past and was told it was related to medication but she is not sure which medication was it. She tells me she has not started any new medicines recently. Last medication she was started on was approximately month ago and was codeine with Tylenol. She tells me that last time she took codeine with Tylenol was approximately one week ago. In addition she denies recent sicknesses or hospitalizations, no episodes of chest pain or shortness of breath, no abdominal or urinary concerns, no weakness, no headaches.  Review of Systems Constitutional: Denies fever, chills, diaphoresis, appetite change and fatigue.  HEENT: Denies photophobia, eye pain, redness, hearing loss, ear pain, congestion, sore throat, rhinorrhea, sneezing, mouth sores, trouble swallowing, neck pain, neck stiffness and tinnitus.   Respiratory: Denies SOB, DOE, cough, chest tightness,  and wheezing.   Cardiovascular: Denies chest pain, palpitations and leg swelling.  Gastrointestinal: Denies nausea, vomiting, abdominal pain, diarrhea, constipation, blood in stool and abdominal distention.    Genitourinary: Denies dysuria, urgency, frequency, hematuria, flank pain and difficulty urinating.  Musculoskeletal: Denies myalgias, back pain, joint swelling, arthralgias and gait problem.  Neurological: Reports dizziness when turning head in bed, intermittent, denies seizures, syncope, weakness, light-headedness, numbness and headaches.  Hematological: Denies adenopathy. Easy bruising, personal or family bleeding history  Psychiatric/Behavioral: Denies suicidal ideation, mood changes, confusion, nervousness, sleep disturbance and agitation      Objective:   Physical Exam Constitutional: Vital signs reviewed.  Patient is a well-developed and well-nourished in no acute distress and cooperative with exam. Alert and oriented x3.  Neck: Supple, Trachea midline normal ROM, No JVD, mass, thyromegaly, or carotid bruit present.  Cardiovascular: RRR, S1 normal, S2 normal, no MRG, pulses symmetric and intact bilaterally Pulmonary/Chest: CTAB, no wheezes, rales, or rhonchi Abdominal: Soft. Non-tender, non-distended, bowel sounds are normal, no masses, organomegaly, or guarding present.  Musculoskeletal: No joint deformities, erythema, or stiffness, ROM full and no nontender Hematology: no cervical, inginal, or axillary adenopathy.  Neurological: A&O x3, Strenght is normal and symmetric bilaterally, cranial nerve II-XII are grossly intact, no focal motor deficit, sensory intact to light touch bilaterally.  Skin: Warm, dry and intact. Rash present abdominal area, macular rash, no papules or blisters noted, spots or approximately half centimeter to 1 cm in size, round, and  nonbleeding with no pus. The same type of rash present on upper thighs mostly above the knees bilaterally. There are a few areas of excoriations consistent with itching activities.           Assessment & Plan:

## 2010-10-31 NOTE — Assessment & Plan Note (Signed)
TSH checked today and slightly elevated compared to last value. Patient reports compliance with medication and denies skipping any doses. We will increase the dose of Synthroid to 125 mcg tablet every day. TSH will need to be rechecked in 2-3 months.

## 2010-10-31 NOTE — Assessment & Plan Note (Signed)
Fasting lipid panel checked today and LDL is elevated compared to last reading. This could be related to hypothyroidism but patient tells me she has not been taking medication because she did not know she was supposed to take anticholesterol med. We will provide refill today and I have educated her on importance of compliance with medication as well as recommended diet and exercise.

## 2010-10-31 NOTE — Assessment & Plan Note (Signed)
Well-controlled on current medication regimen. I have rechecked blood pressure 15 minutes into the visit and the reading was 125/77. We will continue same medication regimen. I advised patient to check her blood pressure regularly and to call us back if the numbers are higher than 140/90. We'll provide refill today.

## 2010-12-03 ENCOUNTER — Other Ambulatory Visit: Payer: Self-pay | Admitting: *Deleted

## 2010-12-03 MED ORDER — OMEPRAZOLE 40 MG PO CPDR: 40.0000 mg | DELAYED_RELEASE_CAPSULE | Freq: Two times a day (BID) | ORAL | Status: DC

## 2010-12-10 LAB — DIFFERENTIAL
Basophils Absolute: 0.1
Basophils Relative: 1
Eosinophils Absolute: 0.6
Eosinophils Relative: 5
Lymphocytes Relative: 33
Lymphs Abs: 3.7
Monocytes Absolute: 0.9
Monocytes Relative: 8
Neutro Abs: 6.2
Neutrophils Relative %: 54

## 2010-12-10 LAB — CBC
HCT: 42
Hemoglobin: 14.5
MCHC: 34.5
MCV: 94.2
Platelets: 261
RBC: 4.46
RDW: 13.8
WBC: 11.4 — ABNORMAL HIGH

## 2010-12-10 LAB — POCT I-STAT, CHEM 8
BUN: 12
Calcium, Ion: 1.17
Chloride: 104
Creatinine, Ser: 0.9
Glucose, Bld: 101 — ABNORMAL HIGH
HCT: 44
Hemoglobin: 15
Potassium: 4.2
Sodium: 141
TCO2: 30

## 2010-12-10 LAB — POCT CARDIAC MARKERS
CKMB, poc: 2.3
Myoglobin, poc: 78.1
Troponin i, poc: <0.05

## 2010-12-10 LAB — D-DIMER, QUANTITATIVE (NOT AT ARMC): D-Dimer, Quant: 0.36

## 2010-12-11 ENCOUNTER — Emergency Department (HOSPITAL_COMMUNITY): Payer: Self-pay

## 2010-12-11 ENCOUNTER — Inpatient Hospital Stay (HOSPITAL_COMMUNITY)
Admission: EM | Admit: 2010-12-11 | Discharge: 2010-12-14 | DRG: 249 | Disposition: A | Discharge status: Home / Self Care | Payer: Self-pay | Attending: Cardiology | Admitting: Cardiology

## 2010-12-11 DIAGNOSIS — K219 Gastro-esophageal reflux disease without esophagitis: Secondary | ICD-10-CM | POA: Diagnosis present

## 2010-12-11 DIAGNOSIS — F3289 Other specified depressive episodes: Secondary | ICD-10-CM | POA: Diagnosis present

## 2010-12-11 DIAGNOSIS — E039 Hypothyroidism, unspecified: Secondary | ICD-10-CM | POA: Diagnosis present

## 2010-12-11 DIAGNOSIS — M129 Arthropathy, unspecified: Secondary | ICD-10-CM | POA: Diagnosis present

## 2010-12-11 DIAGNOSIS — E669 Obesity, unspecified: Secondary | ICD-10-CM | POA: Diagnosis present

## 2010-12-11 DIAGNOSIS — F329 Major depressive disorder, single episode, unspecified: Secondary | ICD-10-CM | POA: Diagnosis present

## 2010-12-11 DIAGNOSIS — E785 Hyperlipidemia, unspecified: Secondary | ICD-10-CM | POA: Diagnosis present

## 2010-12-11 DIAGNOSIS — I10 Essential (primary) hypertension: Secondary | ICD-10-CM | POA: Diagnosis present

## 2010-12-11 DIAGNOSIS — R7309 Other abnormal glucose: Secondary | ICD-10-CM | POA: Diagnosis present

## 2010-12-11 DIAGNOSIS — I2510 Atherosclerotic heart disease of native coronary artery without angina pectoris: Principal | ICD-10-CM | POA: Diagnosis present

## 2010-12-11 DIAGNOSIS — E7800 Pure hypercholesterolemia, unspecified: Secondary | ICD-10-CM | POA: Diagnosis present

## 2010-12-11 LAB — URINALYSIS, ROUTINE W REFLEX MICROSCOPIC
Bilirubin Urine: NEGATIVE
Glucose, UA: NEGATIVE mg/dL
Hgb urine dipstick: NEGATIVE
Ketones, ur: NEGATIVE mg/dL
Leukocytes, UA: NEGATIVE
Nitrite: NEGATIVE
Protein, ur: NEGATIVE mg/dL
Specific Gravity, Urine: 1.027 (ref 1.005–1.030)
Urobilinogen, UA: 0.2 mg/dL (ref 0.0–1.0)
pH: 6.5 (ref 5.0–8.0)

## 2010-12-11 LAB — DIFFERENTIAL
Basophils Absolute: 0.1 10*3/uL (ref 0.0–0.1)
Basophils Relative: 1 % (ref 0–1)
Eosinophils Absolute: 0.4 10*3/uL (ref 0.0–0.7)
Eosinophils Relative: 4 % (ref 0–5)
Lymphocytes Relative: 32 % (ref 12–46)
Lymphs Abs: 3.5 10*3/uL (ref 0.7–4.0)
Monocytes Absolute: 0.7 10*3/uL (ref 0.1–1.0)
Monocytes Relative: 7 % (ref 3–12)
Neutro Abs: 6.3 10*3/uL (ref 1.7–7.7)
Neutrophils Relative %: 57 % (ref 43–77)

## 2010-12-11 LAB — HEPATIC FUNCTION PANEL
ALT: 20 U/L (ref 0–35)
AST: 15 U/L (ref 0–37)
Albumin: 3.5 g/dL (ref 3.5–5.2)
Alkaline Phosphatase: 114 U/L (ref 39–117)
Bilirubin, Direct: <0.1 mg/dL (ref 0.0–0.3)
Total Bilirubin: 0.1 mg/dL — ABNORMAL LOW (ref 0.3–1.2)
Total Protein: 6.7 g/dL (ref 6.0–8.3)

## 2010-12-11 LAB — POCT I-STAT, CHEM 8
BUN: 14 mg/dL (ref 6–23)
Calcium, Ion: 1.19 mmol/L (ref 1.12–1.32)
Chloride: 103 mEq/L (ref 96–112)
Creatinine, Ser: 0.6 mg/dL (ref 0.50–1.10)
Glucose, Bld: 141 mg/dL — ABNORMAL HIGH (ref 70–99)
HCT: 43 % (ref 36.0–46.0)
Hemoglobin: 14.6 g/dL (ref 12.0–15.0)
Potassium: 3.7 mEq/L (ref 3.5–5.1)
Sodium: 141 mEq/L (ref 135–145)
TCO2: 26 mmol/L (ref 0–100)

## 2010-12-11 LAB — CBC
HCT: 41.3 % (ref 36.0–46.0)
Hemoglobin: 14.1 g/dL (ref 12.0–15.0)
MCH: 30.7 pg (ref 26.0–34.0)
MCHC: 34.1 g/dL (ref 30.0–36.0)
MCV: 89.8 fL (ref 78.0–100.0)
Platelets: 246 10*3/uL (ref 150–400)
RBC: 4.6 MIL/uL (ref 3.87–5.11)
RDW: 13.7 % (ref 11.5–15.5)
WBC: 11 10*3/uL — ABNORMAL HIGH (ref 4.0–10.5)

## 2010-12-11 LAB — POCT I-STAT TROPONIN I: Troponin i, poc: 0.01 ng/mL (ref 0.00–0.08)

## 2010-12-12 ENCOUNTER — Observation Stay (HOSPITAL_COMMUNITY): Payer: Self-pay

## 2010-12-12 DIAGNOSIS — R079 Chest pain, unspecified: Secondary | ICD-10-CM

## 2010-12-12 LAB — CK TOTAL AND CKMB (NOT AT ARMC)
CK, MB: 2.9 ng/mL (ref 0.3–4.0)
Relative Index: INVALID (ref 0.0–2.5)
Total CK: 76 U/L (ref 7–177)

## 2010-12-12 LAB — POCT I-STAT TROPONIN I
Troponin i, poc: 0 ng/mL (ref 0.00–0.08)
Troponin i, poc: 0.01 ng/mL (ref 0.00–0.08)

## 2010-12-12 LAB — CARDIAC PANEL(CRET KIN+CKTOT+MB+TROPI)
CK, MB: 3 ng/mL (ref 0.3–4.0)
Relative Index: INVALID (ref 0.0–2.5)
Total CK: 76 U/L (ref 7–177)
Troponin I: <0.3 ng/mL (ref <0.30)

## 2010-12-12 LAB — TROPONIN I: Troponin I: <0.3 ng/mL (ref <0.30)

## 2010-12-12 LAB — TSH: TSH: 4.411 u[IU]/mL (ref 0.350–4.500)

## 2010-12-12 LAB — HEMOGLOBIN A1C
Hgb A1c MFr Bld: 6.9 % — ABNORMAL HIGH (ref <5.7)
Mean Plasma Glucose: 151 mg/dL — ABNORMAL HIGH (ref <117)

## 2010-12-12 LAB — PROTIME-INR
INR: 1 (ref 0.00–1.49)
Prothrombin Time: 13.4 seconds (ref 11.6–15.2)

## 2010-12-12 LAB — PRO B NATRIURETIC PEPTIDE: Pro B Natriuretic peptide (BNP): 35.9 pg/mL (ref 0–125)

## 2010-12-12 LAB — APTT: aPTT: 28 seconds (ref 24–37)

## 2010-12-12 MED ORDER — IOHEXOL 350 MG/ML SOLN
80.0000 mL | Freq: Once | INTRAVENOUS | Status: AC | PRN
  Administered 2010-12-12: 80 mL via INTRAVENOUS

## 2010-12-13 DIAGNOSIS — I2510 Atherosclerotic heart disease of native coronary artery without angina pectoris: Secondary | ICD-10-CM

## 2010-12-13 LAB — COMPREHENSIVE METABOLIC PANEL
ALT: 18 U/L (ref 0–35)
AST: 16 U/L (ref 0–37)
Albumin: 3.4 g/dL — ABNORMAL LOW (ref 3.5–5.2)
Alkaline Phosphatase: 118 U/L — ABNORMAL HIGH (ref 39–117)
BUN: 14 mg/dL (ref 6–23)
CO2: 30 mEq/L (ref 19–32)
Calcium: 9.6 mg/dL (ref 8.4–10.5)
Chloride: 102 mEq/L (ref 96–112)
Creatinine, Ser: 0.59 mg/dL (ref 0.50–1.10)
GFR calc Af Amer: >90 mL/min (ref >90)
GFR calc non Af Amer: >90 mL/min (ref >90)
Glucose, Bld: 147 mg/dL — ABNORMAL HIGH (ref 70–99)
Potassium: 3.7 mEq/L (ref 3.5–5.1)
Sodium: 138 mEq/L (ref 135–145)
Total Bilirubin: 0.2 mg/dL — ABNORMAL LOW (ref 0.3–1.2)
Total Protein: 6.4 g/dL (ref 6.0–8.3)

## 2010-12-13 LAB — CARDIAC PANEL(CRET KIN+CKTOT+MB+TROPI)
CK, MB: 3.2 ng/mL (ref 0.3–4.0)
CK, MB: 3.1 ng/mL (ref 0.3–4.0)
Relative Index: INVALID (ref 0.0–2.5)
Relative Index: INVALID (ref 0.0–2.5)
Total CK: 76 U/L (ref 7–177)
Total CK: 76 U/L (ref 7–177)
Troponin I: <0.3 ng/mL (ref <0.30)
Troponin I: <0.3 ng/mL (ref <0.30)

## 2010-12-13 LAB — GLUCOSE, CAPILLARY: Glucose-Capillary: 151 mg/dL — ABNORMAL HIGH (ref 70–99)

## 2010-12-13 LAB — LIPID PANEL
Cholesterol: 192 mg/dL (ref 0–200)
HDL: 24 mg/dL — ABNORMAL LOW (ref >39)
LDL Cholesterol: 93 mg/dL (ref 0–99)
Total CHOL/HDL Ratio: 8 RATIO
Triglycerides: 375 mg/dL — ABNORMAL HIGH (ref <150)
VLDL: 75 mg/dL — ABNORMAL HIGH (ref 0–40)

## 2010-12-13 LAB — HEPARIN LEVEL (UNFRACTIONATED): Heparin Unfractionated: 0.1 IU/mL — ABNORMAL LOW (ref 0.30–0.70)

## 2010-12-13 LAB — CBC
HCT: 42.2 % (ref 36.0–46.0)
Hemoglobin: 14.4 g/dL (ref 12.0–15.0)
MCH: 30.6 pg (ref 26.0–34.0)
MCHC: 34.1 g/dL (ref 30.0–36.0)
MCV: 89.6 fL (ref 78.0–100.0)
Platelets: 250 10*3/uL (ref 150–400)
RBC: 4.71 MIL/uL (ref 3.87–5.11)
RDW: 13.6 % (ref 11.5–15.5)
WBC: 12.1 10*3/uL — ABNORMAL HIGH (ref 4.0–10.5)

## 2010-12-13 LAB — POCT ACTIVATED CLOTTING TIME: Activated Clotting Time: 386 seconds

## 2010-12-14 LAB — URINE CULTURE
Colony Count: 50000
Culture  Setup Time: 201210051034
Special Requests: NEGATIVE

## 2010-12-14 LAB — BASIC METABOLIC PANEL
BUN: 9 mg/dL (ref 6–23)
CO2: 27 mEq/L (ref 19–32)
Calcium: 9.5 mg/dL (ref 8.4–10.5)
Chloride: 103 mEq/L (ref 96–112)
Creatinine, Ser: 0.55 mg/dL (ref 0.50–1.10)
GFR calc Af Amer: >90 mL/min (ref >90)
GFR calc non Af Amer: >90 mL/min (ref >90)
Glucose, Bld: 136 mg/dL — ABNORMAL HIGH (ref 70–99)
Potassium: 3.9 mEq/L (ref 3.5–5.1)
Sodium: 138 mEq/L (ref 135–145)

## 2010-12-14 LAB — GLUCOSE, CAPILLARY
Glucose-Capillary: 143 mg/dL — ABNORMAL HIGH (ref 70–99)
Glucose-Capillary: 161 mg/dL — ABNORMAL HIGH (ref 70–99)

## 2010-12-14 LAB — CBC
HCT: 42.7 % (ref 36.0–46.0)
Hemoglobin: 14.7 g/dL (ref 12.0–15.0)
MCH: 30.4 pg (ref 26.0–34.0)
MCHC: 34.4 g/dL (ref 30.0–36.0)
MCV: 88.2 fL (ref 78.0–100.0)
Platelets: 254 10*3/uL (ref 150–400)
RBC: 4.84 MIL/uL (ref 3.87–5.11)
RDW: 13.5 % (ref 11.5–15.5)
WBC: 15 10*3/uL — ABNORMAL HIGH (ref 4.0–10.5)

## 2010-12-16 LAB — DIFFERENTIAL
Basophils Absolute: 0
Basophils Relative: 0
Eosinophils Absolute: 0.3
Eosinophils Relative: 3
Lymphocytes Relative: 23
Lymphs Abs: 2.1
Monocytes Absolute: 0.8
Monocytes Relative: 9
Neutro Abs: 5.9
Neutrophils Relative %: 65

## 2010-12-16 LAB — I-STAT 8, (EC8 V) (CONVERTED LAB)
Acid-Base Excess: 1
BUN: 11
Bicarbonate: 26.8 — ABNORMAL HIGH
Chloride: 105
Glucose, Bld: 121 — ABNORMAL HIGH
HCT: 49 — ABNORMAL HIGH
Hemoglobin: 16.7 — ABNORMAL HIGH
Operator id: 196461
Potassium: 4.3
Sodium: 139
TCO2: 28
pCO2, Ven: 46.7
pH, Ven: 7.367 — ABNORMAL HIGH

## 2010-12-16 LAB — ETHANOL: Alcohol, Ethyl (B): <5

## 2010-12-16 LAB — RAPID URINE DRUG SCREEN, HOSP PERFORMED
Amphetamines: NOT DETECTED
Barbiturates: NOT DETECTED
Benzodiazepines: NOT DETECTED
Cocaine: POSITIVE — AB
Opiates: NOT DETECTED
Tetrahydrocannabinol: POSITIVE — AB

## 2010-12-16 LAB — CBC
HCT: 45.3
Hemoglobin: 15.5 — ABNORMAL HIGH
MCHC: 34.1
MCV: 93.2
Platelets: 273
RBC: 4.86
RDW: 13.8
WBC: 9

## 2010-12-17 NOTE — Cardiovascular Report (Signed)
  NAME:  Kendra Adams, GRANLUND NO.:  [id number]  MEDICAL RECORD NO.:  [id number]  LOCATION:  2506                         FACILITY:  [facility]  PHYSICIAN:  Peter M. Martinique, M.D.  DATE OF BIRTH:  07-20-1960  DATE OF PROCEDURE: DATE OF DISCHARGE:                           CARDIAC CATHETERIZATION   INDICATIONS FOR PROCEDURE:  A 50 year old white female with history of hypertension, hypercholesterolemia and obesity presents with unstable angina.  Diagnostic cardiac catheterization demonstrates a sequential 95% lesions in the mid right coronary artery.  This was a very large dominant vessel.  Percutaneous intervention was recommended.  PROCEDURE:  Intracoronary stenting of the mid right coronary artery with a bare-metal stent.  ACCESS:  Via the right femoral artery from her diagnostic cardiac catheterization.  We exchanged out for a 6-French sheath.  She received Angiomax 0.75 mg/kg IV followed by continuous infusion of 1.75 mg/kg/hour.  Subsequent ACT was 387 seconds.  She received an additional 2 mg of Versed and 50 mg of fentanyl IV, Plavix 600 mg p.o. was given, nitroglycerin 200 mcg intracoronary x1.  After initial guide shots were obtained, we used a 6-French FR-4 guide and a Prowater wire to cross the lesion with modest difficulty.  We then predilated the lesion using a 3.0 x 20 mm Sprinter balloon to 8 atmospheres x2.  We then attempted to cross with a 5.0 x 38 mm Multi- Link Ultra stent.  We were unable to cross this because of the tortuosity in the proximal vessel.  Using a BMW as a buddy wire and deep seating of the guide, we were able to pass the stent and deployed this at 12 atmospheres.  We then postdilated the stent using a 5.5 x 20 mm Aviator balloon to 10 atmospheres x2.  This yielded an excellent angiographic result with 0% residual stenosis and TIMI grade 3 flow.  FINAL ASSESSMENT:  Successful stenting of the mid right coronary artery with a bare-metal  stent.  PLAN:  Continue on aspirin and Plavix.          ______________________________ Peter M. Martinique, M.D.     PMJ/MEDQ  D:  12/13/2010  T:  12/14/2010  Job:  [id number]  cc:   Thomas C. Verl Blalock, MD, [facility] Thayer Headings, M.D.  Electronically Signed by PETER Martinique M.D. on 12/17/2010 01:03:53 PM

## 2010-12-24 ENCOUNTER — Telehealth: Payer: Self-pay | Admitting: *Deleted

## 2010-12-24 LAB — I-STAT 8, (EC8 V) (CONVERTED LAB)
Acid-Base Excess: 1
BUN: 8
Bicarbonate: 26.7 — ABNORMAL HIGH
Chloride: 108
Glucose, Bld: 112 — ABNORMAL HIGH
HCT: 45
Hemoglobin: 15.3 — ABNORMAL HIGH
Operator id: 272551
Potassium: 3.8
Sodium: 141
TCO2: 28
pCO2, Ven: 45.4
pH, Ven: 7.378 — ABNORMAL HIGH

## 2010-12-24 LAB — POCT CARDIAC MARKERS
CKMB, poc: 3.1
Myoglobin, poc: 144
Operator id: 272551
Troponin i, poc: <0.05

## 2010-12-24 LAB — POCT I-STAT CREATININE
Creatinine, Ser: 0.9
Operator id: 272551

## 2010-12-24 NOTE — Telephone Encounter (Signed)
Refill request form Kendra Adams for Benzonatate 100 mg Cap.  Call to pt to ask about cough.  Said that coughing up slightly green phlegm x 2 weeks.  No fevers or chills.  Said that she does have hot flashes at times.  Pt said that she has been around a 84 month old who has had a cold.  Pt also has an appointment this coming Friday to see Dr. Marcello Moores.  Pt also said that she had a Stent put in an artery a few days ago.  Is doing ok from this.  Pt said that she had th e cough before she had the Stent put in as well.

## 2010-12-26 ENCOUNTER — Encounter: Payer: Self-pay | Admitting: Physician Assistant

## 2010-12-26 ENCOUNTER — Ambulatory Visit (INDEPENDENT_AMBULATORY_CARE_PROVIDER_SITE_OTHER): Payer: Self-pay | Admitting: Physician Assistant

## 2010-12-26 ENCOUNTER — Inpatient Hospital Stay (INDEPENDENT_AMBULATORY_CARE_PROVIDER_SITE_OTHER)
Admission: RE | Admit: 2010-12-26 | Discharge: 2010-12-26 | Disposition: A | Discharge status: Home / Self Care | Payer: Self-pay | Source: Ambulatory Visit | Attending: Family Medicine | Admitting: Family Medicine

## 2010-12-26 VITALS — BP 120/88 | HR 88 | Ht 63.0 in | Wt 234.0 lb

## 2010-12-26 DIAGNOSIS — I2510 Atherosclerotic heart disease of native coronary artery without angina pectoris: Secondary | ICD-10-CM

## 2010-12-26 DIAGNOSIS — E119 Type 2 diabetes mellitus without complications: Secondary | ICD-10-CM

## 2010-12-26 DIAGNOSIS — E785 Hyperlipidemia, unspecified: Secondary | ICD-10-CM

## 2010-12-26 DIAGNOSIS — S91009A Unspecified open wound, unspecified ankle, initial encounter: Secondary | ICD-10-CM

## 2010-12-26 DIAGNOSIS — R58 Hemorrhage, not elsewhere classified: Secondary | ICD-10-CM

## 2010-12-26 DIAGNOSIS — S81009A Unspecified open wound, unspecified knee, initial encounter: Secondary | ICD-10-CM

## 2010-12-26 DIAGNOSIS — I10 Essential (primary) hypertension: Secondary | ICD-10-CM

## 2010-12-26 NOTE — Cardiovascular Report (Signed)
  NAMEMarland Kitchen  LATONDRA, GEBHART NO.:  [id number]  MEDICAL RECORD NO.:  [id number]  LOCATION:  2506                         FACILITY:  [facility]  PHYSICIAN:  Thayer Headings, M.D. DATE OF BIRTH:  1960/12/10  DATE OF PROCEDURE:  12/13/2010 DATE OF DISCHARGE:                           CARDIAC CATHETERIZATION   INDICATIONS:  Kendra Adams is a 50 year old female with a recent onset of chest pain.  She had a CT angiogram performed in the emergency room that revealed significant sequential stenosis in the right coronary artery. She was admitted for cardiac catheterization.  PROCEDURE:  Left heart catheterization with coronary angiography.  PROCEDURE TECHNIQUE:  The right femoral artery was easily cannulated using modified Seldinger technique.  HEMODYNAMICS:  LV pressure was 152/0.  The aortic pressure was 153/91.  ANGIOGRAPHY:  Left main:  The left main is normal.  Left anterior descending artery is smooth and normal.  There is a large diagonal branch, which is normal.  The left circumflex artery is smooth and normal.  The right coronary artery is extremely large.  There are tandem 99% stenosis in the mid right coronary artery.  There is still TIMI grade 2 flow through the stenosis.  The posterior descending artery and posterolateral segment artery are fairly normal.  Left ventriculogram was performed in a 30-RAO position.  It reveals overall normal left ventricular systolic function.  In particular, the inferior wall contracts normally.  Ejection fraction is around 65% to 70%.  COMPLICATIONS:  None.  CONCLUSIONS: 1. Significant coronary artery disease involving the mid right     coronary artery.  I have discussed the case with Dr. Martinique.  She     will need PCI today. 2. Normal left ventricular systolic function.    Thayer Headings, M.D.    PJN/MEDQ  D:  12/13/2010  T:  12/14/2010  Job:  [id number]  Electronically Signed by Mertie Moores M.D. on 12/26/2010  09:52:29 AM

## 2010-12-26 NOTE — Assessment & Plan Note (Signed)
Managed by PCP

## 2010-12-26 NOTE — Assessment & Plan Note (Signed)
New dx.  Follow up with PCP.

## 2010-12-26 NOTE — Assessment & Plan Note (Signed)
Leg wound will need a stitch to stop bleeding.  She cannot stop ASA or Plavix.  Will send her to urgent care or ED now.

## 2010-12-26 NOTE — Patient Instructions (Addendum)
NO CHANGES TODAY.   PLEASE CALL AND MAKE AN APPT TO SEE DR. WALL IN 3 MONTHS.  ....03/25/11 11:30 AM  PLEASE FOLLOW UP WITH YOUR PRIMARY CARE PHYSICIAN 12/27/10 AS PER SCOTT WEAVER, PA-C  PLEASE GO TO [facility] TODAY TO HAVE YOUR LEFT LEG EVALUATED FOR A STITCH PER SCOTT WEAVER, PA-C

## 2010-12-26 NOTE — Assessment & Plan Note (Signed)
Controlled.  Continue current therapy.  

## 2010-12-26 NOTE — Assessment & Plan Note (Signed)
Currently doing well post PCI.  No angina.  Continue aspirin and Plavix.  She will continue to increase activity on her own.  She is trying to quit smoking.  Followup with Dr. Verl Blalock in 3 months.

## 2010-12-26 NOTE — Progress Notes (Signed)
History of Present Illness: Primary Cardiologist:  Dr. Jenell Milliner   Kendra Adams is a 50 y.o. female who presents for post hospital follow up.  She was admitted 10/4-10/6 with unstable angina.  MI was ruled out.  Cath 10/5 demonstrated tandem 99% mRCA lesions treated with BMS.  EF was 65-70%.  Labs: Hgb 14.7, K 3.9, creatinine 0.55, ALT 18, A1C 6.9 (new dx), TC 192, TG 375, HDL 24, LDL 93, TSH 4.411.  CXR was unremarkable.    She cut her right leg shaving this am.  She was in the restroom and noted to a significant amount of bleeding from the area.  I looked at her leg and she has a small cut with a steady high pressure stream of blood coming from the area.  She will be sent directly to the ED from here for possible stitch to stop the bleeding.   The patient denies chest pain, shortness of breath, syncope, orthopnea, PND or significant pedal edema.   Past Medical History  Diagnosis Date  . Carpal tunnel syndrome on both sides     right hand surgery 1998, left hand surgery 1999.  . Allergic rhinitis   . Asthma   . Depression   . GERD (gastroesophageal reflux disease)   . Hyperlipidemia   . Hypothyroidism   . Psoriasis   . CAD (coronary artery disease)     cath 12/13/10: mRCA 99%, EF 65-70%;  s/p BMS to mRCA  . HTN (hypertension)   . DM2 (diabetes mellitus, type 2)     Current Outpatient Prescriptions  Medication Sig Dispense Refill  . albuterol (VENTOLIN HFA) 108 (90 BASE) MCG/ACT inhaler Inhale 2 puffs into the lungs. Every 4 - 6 hours as needed       . aspirin 81 MG tablet Take 81 mg by mouth daily.        . clopidogrel (PLAVIX) 75 MG tablet Take 75 mg by mouth daily.        Marland Kitchen Cod Liver Oil 1000 MG CAPS Take by mouth.        . Fluticasone-Salmeterol (ADVAIR) 250-50 MCG/DOSE AEPB Inhale 1 puff into the lungs 2 (two) times daily. Uses as needed       . levothyroxine (SYNTHROID, LEVOTHROID) 125 MCG tablet Take 125 mcg by mouth daily.        Marland Kitchen lisinopril (PRINIVIL,ZESTRIL) 5 MG tablet  Take 1 tablet (5 mg total) by mouth daily.  30 tablet  5  . metoprolol tartrate (LOPRESSOR) 25 MG tablet Take 25 mg by mouth 2 (two) times daily.        . Omega-3 Fatty Acids (FISH OIL) 1360 MG CAPS Take 1 capsule by mouth daily.        Marland Kitchen omeprazole (PRILOSEC) 40 MG capsule Take 1 capsule (40 mg total) by mouth 2 (two) times daily.  180 capsule  1  . pravastatin (PRAVACHOL) 40 MG tablet Take 40 mg by mouth 2 (two) times daily.        . Pyridoxine HCl (VITAMIN B-6) 250 MG tablet Take 500 mg by mouth daily.        . sertraline (ZOLOFT) 25 MG tablet Take 50 mg by mouth Nightly. Taking 61m daily       . sodium chloride (OCEAN NASAL SPRAY) 0.65 % nasal spray 1 spray by Nasal route as needed for congestion.  45 mL  3  . DISCONTD: pravastatin (PRAVACHOL) 40 MG tablet Take 1 tablet (40 mg total) by mouth daily.  31 tablet  11    Allergies: Allergies  Allergen Reactions  . Tramadol Hcl     REACTION: nausea    Vital Signs: BP 120/88  Pulse 88  Ht 5' 3"  (1.6 m)  Wt 234 lb (106.142 kg)  BMI 41.45 kg/m2  PHYSICAL EXAM: Well nourished, well developed, in no acute distress HEENT: normal Neck: no JVD At 90 Cardiac:  normal S1, S2; RRR; no murmur Lungs:  clear to auscultation bilaterally, no wheezing, rhonchi or rales Abd: soft, nontender, no hepatomegaly Ext: no edema; Right femoral arteriotomy site without hematoma or bruit, small cut on right leg with high pressure stream of blood Skin: warm and dry Neuro:  CNs 2-12 intact, no focal abnormalities noted Psych: normal affect  EKG:  Sinus rhythm, heart rate 85, Normal axis, no ischemic changes  ASSESSMENT AND PLAN:

## 2010-12-27 ENCOUNTER — Encounter: Payer: Self-pay | Admitting: Ophthalmology

## 2010-12-27 ENCOUNTER — Ambulatory Visit (INDEPENDENT_AMBULATORY_CARE_PROVIDER_SITE_OTHER): Payer: Self-pay | Admitting: Ophthalmology

## 2010-12-27 DIAGNOSIS — I2510 Atherosclerotic heart disease of native coronary artery without angina pectoris: Secondary | ICD-10-CM

## 2010-12-27 DIAGNOSIS — E119 Type 2 diabetes mellitus without complications: Secondary | ICD-10-CM

## 2010-12-27 DIAGNOSIS — E039 Hypothyroidism, unspecified: Secondary | ICD-10-CM

## 2010-12-27 DIAGNOSIS — B351 Tinea unguium: Secondary | ICD-10-CM | POA: Insufficient documentation

## 2010-12-27 LAB — GLUCOSE, CAPILLARY: Glucose-Capillary: 150 mg/dL — ABNORMAL HIGH (ref 70–99)

## 2010-12-27 MED ORDER — SERTRALINE HCL 50 MG PO TABS: 75.0000 mg | ORAL_TABLET | Freq: Every evening | ORAL | Status: DC

## 2010-12-27 MED ORDER — LEVOTHYROXINE SODIUM 150 MCG PO TABS: 150.0000 ug | ORAL_TABLET | Freq: Every day | ORAL | Status: DC

## 2010-12-27 MED ORDER — LISINOPRIL 5 MG PO TABS: 5.0000 mg | ORAL_TABLET | Freq: Every day | ORAL | Status: DC

## 2010-12-27 NOTE — Assessment & Plan Note (Signed)
Patient is post cath and bare metal stent. Is asymptomatic currently.

## 2010-12-27 NOTE — Assessment & Plan Note (Signed)
Patient was recently diagnosed while inpatient with DM type II via HgA1c of 6.9. Is currently at a level that can be treated with diet alone. Will refer to diabetes educator to help patient make good dietary decisions.

## 2010-12-27 NOTE — Assessment & Plan Note (Signed)
Patient started treatment with terbinafine recently. Will refer to podiatry to filing down of toenails which are very raised and difficult to wear proper shoes with.

## 2010-12-27 NOTE — Patient Instructions (Signed)
-  Please work on your diet, focusing on less sugars and carbohydrates with a goal of weight loss in next 3 months. Will refer to Nutritionist/diabetes educator.  -podiatry for toenail fungus -please come into lab to have thyroid levels checked in next week

## 2010-12-27 NOTE — Assessment & Plan Note (Signed)
Patient has been taking 150 mcg of levothyroxine. Will check TSH this week to assess her on this level.

## 2010-12-27 NOTE — Progress Notes (Signed)
Subjective:   Patient ID: BREEANNA GALGANO female   DOB: 1961-02-10 50 y.o.   MRN: [id number]  HPI: Ms.Howard D Krone is a 50 y.o. woman with several complaints today:  Irritable- 'gets bent out of shape' if things don't go smoothly In the past has taken zoloft in 2003 but lost job and went without meds for several years. Was using lexapro that she got from her aunt. She would take every other day to conserve meds. She says lexapro made her almost completely emotionless at that time. Patient says she has been taking 1.5 tablet of zoloft which is 63m/day. Don't see a change in her mood in past month. Reports good relationship with her husband but he is somewhat 'spoiled' so that gets on her nerves.  Patient has been taking 1.5 tablets of levothyroxine which is  150 mcg levothroxine a day. She was instructed to take 1258m but this was difficult to administer in real life with pill cutting. She was borderline high in TSH at last visit so this may be appropriate dosing.  Newly diagnosed type II diabetic with HgbA1c of 6.9. Limiting soda to one can since had stent put in. Is switching to calorie free sparkling beverage. Has lost about 20 lbs in last few years.  Stent- had chest discomfort, radiated to both side os of neck- felt full. Radiated down both arms. Had sweatiness. Has not had any symptoms since her hospitalization. Is taking aspirin, plavix, pravachol.  Toenail fungus, Very distorted and yellowed. Had been prescribed terbinafine for 12 weeks earlier in the year but didn't fill due to high cost. [facility] [place]   Past Medical History  Diagnosis Date  . Carpal tunnel syndrome on both sides     right hand surgery 1998, left hand surgery 1999.  . Allergic rhinitis   . Asthma   . Depression   . GERD (gastroesophageal reflux disease)   . Hyperlipidemia   . Hypothyroidism   . Psoriasis   . CAD (coronary artery disease)    cath 12/13/10: mRCA 99%, EF 65-70%;  s/p BMS to mRCA  . HTN (hypertension)   . DM2 (diabetes mellitus, type 2)    Current Outpatient Prescriptions  Medication Sig Dispense Refill  . albuterol (VENTOLIN HFA) 108 (90 BASE) MCG/ACT inhaler Inhale 2 puffs into the lungs. Every 4 - 6 hours as needed       . aspirin 81 MG tablet Take 81 mg by mouth daily.        . clopidogrel (PLAVIX) 75 MG tablet Take 75 mg by mouth daily.        . Fluticasone-Salmeterol (ADVAIR) 250-50 MCG/DOSE AEPB Inhale 1 puff into the lungs 2 (two) times daily. Uses as needed       . lisinopril (PRINIVIL,ZESTRIL) 5 MG tablet Take 1 tablet (5 mg total) by mouth daily.  90 tablet  3  . metoprolol tartrate (LOPRESSOR) 25 MG tablet Take 25 mg by mouth 2 (two) times daily.        . Omega-3 Fatty Acids (FISH OIL) 1360 MG CAPS Take 1 capsule by mouth daily.        . Marland Kitchenmeprazole (PRILOSEC) 40 MG capsule Take 1 capsule (40 mg total) by mouth 2 (two) times daily.  180 capsule  1  . pravastatin (PRAVACHOL) 40 MG tablet Take 40 mg by mouth 2 (two) times daily.        . sertraline (ZOLOFT) 50 MG tablet  Take 1.5 tablets (75 mg total) by mouth Nightly. Taking 73m daily  135 tablet  3  . sodium chloride (OCEAN NASAL SPRAY) 0.65 % nasal spray 1 spray by Nasal route as needed for congestion.  45 mL  3  . Cod Liver Oil 1000 MG CAPS Take by mouth.        . levothyroxine (SYNTHROID, LEVOTHROID) 150 MCG tablet Take 1 tablet (150 mcg total) by mouth daily.  90 tablet  3  . Pyridoxine HCl (VITAMIN B-6) 250 MG tablet Take 500 mg by mouth daily.         Family History  Problem Relation Age of Onset  . Diabetes Mother   . Hypertension Mother   . Diabetes Father   . Hypertension Father    History   Social History  . Marital Status: Married    Spouse Name: N/A    Number of Children: 0  . Years of Education: 10th grade   Occupational History  . lawn mower     self-employed   Social History Main Topics  . Smoking status: Current Everyday  Smoker -- 0.5 packs/day for 35 years    Types: Cigarettes  . Smokeless tobacco: Current User    Types: Chew   Comment: trying to quit/ USES SMOKELESS CIGARETTES  . Alcohol Use: No  . Drug Use: No  . Sexually Active: None   Other Topics Concern  . 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 Thickened, yellow green toenails that are raised almost 0.5 inch. Patient wearing crocs. Neuro: alert and oriented X3, cranial nerves II-XII grossly intact, strength and sensation to light touch equal in bilateral upper and lower extremities  Assessment & Plan:

## 2010-12-29 NOTE — H&P (Signed)
Kendra Adams, Kendra Adams NO.:  [id number]  MEDICAL RECORD NO.:  [id number]  LOCATION:  2020                         FACILITY:  [facility]  PHYSICIAN:  Marijo Conception. Briell Paulette, MD, FACCDATE OF BIRTH:  1960-09-05  DATE OF ADMISSION:  12/11/2010 DATE OF DISCHARGE:                             HISTORY & PHYSICAL   CHIEF COMPLAINT:  Chest tightness and burning.  HISTORY OF PRESENT ILLNESS:  [name] is a very pleasant 50 year old married white female who has noted substernal chest tightness with exertion for the last several months.  It usually goes away after a couple of minutes of rest.  Yesterday, while doing some yard work, she began to have this discomfort that went into her jaw.  It would last about 3 minutes and then it would go away but it came back.  She came to the emergency room.  This was associated with shortness of breath and mild nausea.  Cardiac markers here have been negative.  EKG showed no acute changeswith normal sinus rhythm.  Cardiac CT showed what appeared to be a normal left main, focal calcified noncalcified plaque in the mid LAD that was nonobstructive, calcification in the left circumflex, which was nonobstructive plaque, and the right coronary artery that had 2 focal segments consistent with significant obstruction of the mid right coronary artery caused predominantly by noncalcified plaque.  Her left ventricular measurements were normal in terms of size as well as Jernard Reiber thickness.  Aortic root was also normal.  We were asked to see her as Cardiology consult and admission.  PAST MEDICAL HISTORY: 1. She has a history of hypertension. 2. Hyperlipidemia on Crestor. 3. GERD. 4. Osteoarthritis. 5. History of colonic polyps with a history of polypectomy and     colonoscopy.  There is no history of diabetes.  MEDICATIONS ON ADMISSION: 1. Crestor unknown dose daily. 2. Aspirin 81 mg a day. 3. Zoloft unknown dose daily. 4. Synthroid unknown  dose daily. 5. Some blood pressure medicine which she cannot recall. 6. Prilosec.  SOCIAL HISTORY:  She lives in [place] with her husband who is in the room with her.  She smokes less than a pack of cigarettes per day and has for years.  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  She does not drink alcohol.  She is followed for weight issues for years.  FAMILY HISTORY:  Positive for diabetes in her father.  She had a sister and a father who had hypertension and a brother who died of hypertensive cardiomyopathy.  REVIEW OF SYSTEMS:  She has lost about 20 pounds over the summer but is not tired.  She denies any fever, chills, sweats.  There is no bleeding diathesis.  She has a good appetite and normal bowels.  There is no melena or hematochezia.  She does have GERD reflux symptoms.  She denies any polyuria, polydipsia, heat intolerance, cold intolerance.  All other review of systems are negative.  She is a full code.  PHYSICAL EXAMINATION:  GENERAL:  Pleasant lady in no acute distress.VITAL SIGNS:  Her blood pressure is 144/87, pulse is 72 and regular.  She is in sinus rhythm.  O2 sats 94% on room air, respiratory rate 18. She is afebrile. HEENT:  Normocephalic, atraumatic.  PERRLA.  Extraocular movements intact.  Sclerae are clear.  Facial symmetry is normal.  Dentition is fair. NECK:  Supple.  Carotids upstrokes are equal bilaterally without bruits. No thyromegaly.  No tracheal deviation. CHEST:  Lungs are clear to auscultation and percussion except for some expiratory rhonchi.  She has a raspy cough. HEART:  Normal PMI, normal S1-S2, soft heart sounds.  No gallop, rub. ABDOMEN:  Soft, good bowel sounds.  Nondistended.  No tenderness. EXTREMITIES:  No cyanosis, clubbing or edema.  Pulses are present both dorsalis pedis and posterior tibial. NEURO:  Intact. SKIN:  Unremarkable.  All laboratory data, chest CT, and x-rays  reviewed.  ASSESSMENT: 1. New-onset exertional angina. 2. Probable obstructive right coronary artery disease by cardiac CT. 3. Multiple cardiac risk factors including tobacco use,     hyperlipidemia, hypertension, and obesity.  PLAN: 1. Hydrate tonight after receiving 80 mL of contrast today for a     cardiac CT. 2. Cardiac cath tomorrow.  I discussed this at length with the     patient, husband, and her niece who is a Marine scientist here on 5500.     Indications, risks, potential benefits were discussed.  They agreed     to proceed.     Kendra Adams C. Verl Blalock, MD, [facility]     TCW/MEDQ  D:  12/12/2010  T:  12/13/2010  Job:  [id number]  Electronically Signed by Kendra Milliner MD [facility] on 12/29/2010 01:50:45 PM

## 2011-01-10 NOTE — Discharge Summary (Signed)
[name], Adams NO.:  [id number]  MEDICAL RECORD NO.:  [id number]  LOCATION:                                 FACILITY:  PHYSICIAN:  Shaune Pascal. Bensimhon, MDDATE OF BIRTH:  [birth year]  DATE OF ADMISSION:  12/12/2010 DATE OF DISCHARGE:  12/14/2010                              DISCHARGE SUMMARY   PRIMARY CARDIOLOGIST:  Marcello Moores C. Wall, MD, [facility]  PRIMARY CARE PROVIDER:  Joya Martyr, MD at [facility]: 1. Coronary artery disease status post bare-metal stent to the mid     right coronary artery this admission.     a.     Normal left ventricular systolic function. 2. Hypertension. 3. Hyperlipidemia. 4. Borderline diabetes mellitus.  SECONDARY DIAGNOSES: 1. Gastroesophageal reflux disease. 2. Osteoarthritis. 3. Depression. 4. Hypothyroidism.  PROCEDURES/DIAGNOSTICS PERFORMED DURING HOSPITALIZATION: 1. Left heart catheterization with coronary angiography.     a.     Left main is normal, left anterior descending is normal, and      left circumflex is normal.  Right coronary artery with 10%-99%      stenosis in the mid right coronary artery with TIMI grade 2 flow      through the stenosis.  Posterior descending artery and      posterolateral segment artery are fairly normal.     b.     Normal left ventricular systolic function, ejection fraction      65%-70%. 2. Cardiac CT demonstrating a coronary calcium total score of 287     demonstrating positive for coronary artery disease.  Significant     cannulation of the mid right coronary artery due to noncalcified     plaque with likely hemodynamically significant stenosis present.     Nonobstructive disease present in the proximal RCA, proximal LAD,     and mid left circumflex artery.  Right coronary artery dominant. 3. Chest x-ray on December 11, 2010, no active cardiopulmonary disease     or interval change.  REASON FOR HOSPITALIZATION:  This is a  50 year old female with the above- stated problem list who noted exertional substernal chest tightness over the last several months that is resolved with rest.  On the day of admission, her chest discomfort appeared while she was in the yard and went to her jaw.  She had complaints of shortness of breath and mild nausea.  The patient underwent a cardiac CT demonstrating 2 focal segments in the right coronary artery consistent with significant obstruction, otherwise nonobstructive disease.  Therefore, the patient was admitted by the Cardiology Service for new-onset exertional angina and cardiac catheterization was scheduled.  HOSPITAL COURSE:  The patient was admitted to the telemetry unit.  She ruled out for myocardial infarction.  Her EKG was without acute changes. To the catheterization lab on December 13, 2010, for diagnostic left heart catheterization.  As above, there was significant coronary artery disease involving the mid right coronary artery.  After discussion with Dr. Martinique, they felt the patient should proceed with PCI.  Dr. Martinique then completed successful stenting of the mid right coronary artery with a 5.0 x 38-mm Multi-Link Ultra stent.  The patient tolerated the procedure well and was taken for overnight observation.  The patient had no further complaints of chest pain or shortness of breath.  She was ambulating without difficulty.  Her right groin site was without evidence of hematoma.  The patient has been continued on aspirin, Pravachol, and lisinopril.  Lopressor and Plavix have been added to her medical regimen, she tolerated these well.  It was noted that the patient's hemoglobin A1c was elevated at 6.9, and diabetes education was discussed with the patient.  We will have her follow up with her primary care provider for further management; and at this time, we will not start given her recent cardiac CT and catheterization.  On the day of discharge, the patient was  felt stable for home. Discharge plans and instructions were discussed with the patient, and she voiced understanding.  She will follow up in the outpatient setting.  DISCHARGE LABORATORY DATA:  Cardiac enzymes negative x3.  Sodium 138, potassium 3.9, BUN 9, creatinine 0.55.  Hemoglobin 14.7, hematocrit 42.7.  DISCHARGE INSTRUCTIONS: 1. The patient is to increase activity as tolerated.  She is not to     lift anything, weightbearing 5 pounds.  She is not to drive for 2     days.  She is not to have sexual activity for 1 week.  She is to     continue with low-sodium, heart-healthy, and diabetic diet.  She is     to keep her cath site clean and dry and call the office for any     problems. 2. The patient will follow up with [facility] in approximately     2 weeks, call and schedule this appointment. 3. The patient is to follow up with her primary care provider within     the next 2-3 weeks, the patient states she will call to schedule     this. 4. The patient should avoid straining and stop any activity that     causes chest pain or shortness of breath. 5. The patient is to call office in the interim for any problems or     concerns.  DISCHARGE MEDICATIONS: 1. Plavix 75 mg 1 tablet daily. 2. Metoprolol tartrate 25 mg 1 tablet twice daily. 3. Nitroglycerin sublingual 0.4 mg 1 tablet under tongue at onset of     chest pain and may repeat every 5 minutes up to 3 doses as needed. 4. Aspirin 81 mg daily. 5. Benzonatate 100 mg 1 capsule every 6 hours as needed for cough. 6. Fish oil over-the-counter 1 tablet daily. 7. Lisinopril 5 mg 1 tablet daily. 8. Omeprazole 40 mg 1 tablet daily. 9. Pravachol 80 mg 1 tablet daily. 10.Synthroid 125 mcg 1 tablet daily. 11.Zinc OTC 1 tablet daily. 12.Zoloft 50 mg 1 tablet daily.  DURATION OF DISCHARGE:  Greater than 30 minutes with physician and physician extender time.     Cecille Amsterdam,  PA-C   ______________________________ Shaune Pascal. Bensimhon, MD    NB/MEDQ  D:  12/14/2010  T:  12/14/2010  Job:  [id number]  cc:   Thomas C. Wall, MD, [facility] Joya Martyr, MD  Electronically Signed by Pennie Rushing P.A. on 12/24/2010 11:49:15 AM Electronically Signed by Glori Bickers MD on 01/10/2011 02:04:50 PM

## 2011-01-23 ENCOUNTER — Telehealth: Payer: Self-pay | Admitting: Cardiology

## 2011-01-23 NOTE — Telephone Encounter (Signed)
New problem Pt has had headache every day for a month since starting these meds She has been taking metoprolol tartrate and plavix since 12/14/10 Please call

## 2011-01-23 NOTE — Telephone Encounter (Signed)
Complaining of headache since starting the Metoprolol and Plavix.  She states she didn't get headaches in the past.  The headache is the only side effect she is having.  She is taking tylenol for the headache which relieves it and is taking her meds with a meal.  She is aware that Dr Verl Blalock will not be in the office until tomorrow and is ok waiting for his advice.

## 2011-01-24 NOTE — Progress Notes (Signed)
Addended by: Hulan Fray on: 01/24/2011 02:44 PM   Modules accepted: Orders

## 2011-01-24 NOTE — Progress Notes (Signed)
Addended by: Hulan Fray on: 01/24/2011 02:53 PM   Modules accepted: Orders

## 2011-01-24 NOTE — Telephone Encounter (Signed)
I have tried to reach pt on mobile phone and have called home phone. I have left message with,friend, Mickel Baas, per Dr. Verl Blalock pt can stop the metoprolol. Continue Plavix.  She will call back if she has further questions. Horton Chin RN

## 2011-01-24 NOTE — Telephone Encounter (Signed)
She can stop the metoprolol which is probably causing the headaches.

## 2011-02-03 ENCOUNTER — Encounter: Payer: Self-pay | Admitting: Ophthalmology

## 2011-02-03 ENCOUNTER — Ambulatory Visit (INDEPENDENT_AMBULATORY_CARE_PROVIDER_SITE_OTHER): Payer: Self-pay | Admitting: Ophthalmology

## 2011-02-03 VITALS — BP 125/85 | HR 84 | Temp 97.7°F | Ht 63.5 in | Wt 238.9 lb

## 2011-02-03 DIAGNOSIS — B351 Tinea unguium: Secondary | ICD-10-CM

## 2011-02-03 DIAGNOSIS — E039 Hypothyroidism, unspecified: Secondary | ICD-10-CM

## 2011-02-03 DIAGNOSIS — E119 Type 2 diabetes mellitus without complications: Secondary | ICD-10-CM

## 2011-02-03 DIAGNOSIS — Z0000 Encounter for general adult medical examination without abnormal findings: Secondary | ICD-10-CM

## 2011-02-03 DIAGNOSIS — R1032 Left lower quadrant pain: Secondary | ICD-10-CM | POA: Insufficient documentation

## 2011-02-03 LAB — GLUCOSE, CAPILLARY: Glucose-Capillary: 130 mg/dL — ABNORMAL HIGH (ref 70–99)

## 2011-02-03 NOTE — Progress Notes (Signed)
Subjective:   Patient ID: Kendra Adams female   DOB: Jan 01, 1961 50 y.o.   MRN: [id number]  HPI: [name] is a 50 y.o. woman for routine follow up.  Hip pain: When lay on left side, have pain in her left groin. Doesn't bother her if she is on her stomach or back. Watches a lot of TV on her left side. Pain for at least 2 years, worse over the last year, tired of dealing with it. Takes a couple of minutes after laying on her side.   Gyn: Had burning with intercourse, has never had in the past. No changes in condom or positions. Pain during deep thrust not external tissue. History of chlamydia and gonorrhea in her 30s. Does not have sex with her husband due to his morbid obesity. Has other partner for last 10 years, he is married.  Always uses a condom with her partner. Doesn't know for certain if he has other partners. Hasn't been to gynecologist for financial reasons. Hasn't had menses for 9-10 years. Had other partner 2 years ago who likely had other partners. Hasn't been tested for STIs in may years. No constipation.  DM: Didn't go to see diabetes educator for concern for cost. Blood sugar is normal today. Patient says she is not working on her diet very much since her budget is very tight. Has been trying to eat more vegetables.  Onychomycosis: is taking terbinafine regularly. Not able to go to podiatrist due to financial reasons. Has papers to see if she qualifies for orange card.  Hypothyroidism: will check TSH today since not done after last visit to see her TSH on 150 mcg.  Past Medical History  Diagnosis Date  . Carpal tunnel syndrome on both sides     right hand surgery 1998, left hand surgery 1999.  . Allergic rhinitis   . Asthma   . Depression   . GERD (gastroesophageal reflux disease)   . Hyperlipidemia   . Hypothyroidism   . Psoriasis   . CAD (coronary artery disease)     cath 12/13/10: mRCA 99%, EF 65-70%;  s/p BMS to mRCA  . HTN (hypertension)   . DM2 (diabetes  mellitus, type 2)    Current Outpatient Prescriptions  Medication Sig Dispense Refill  . albuterol (VENTOLIN HFA) 108 (90 BASE) MCG/ACT inhaler Inhale 2 puffs into the lungs. Every 4 - 6 hours as needed       . aspirin 81 MG tablet Take 81 mg by mouth daily.        . clopidogrel (PLAVIX) 75 MG tablet Take 75 mg by mouth daily.        Marland Kitchen Cod Liver Oil 1000 MG CAPS Take by mouth.        . Fluticasone-Salmeterol (ADVAIR) 250-50 MCG/DOSE AEPB Inhale 1 puff into the lungs 2 (two) times daily. Uses as needed       . levothyroxine (SYNTHROID, LEVOTHROID) 150 MCG tablet Take 1 tablet (150 mcg total) by mouth daily.  90 tablet  3  . lisinopril (PRINIVIL,ZESTRIL) 5 MG tablet Take 1 tablet (5 mg total) by mouth daily.  90 tablet  3  . metoprolol tartrate (LOPRESSOR) 25 MG tablet Take 25 mg by mouth 2 (two) times daily.        . Omega-3 Fatty Acids (FISH OIL) 1360 MG CAPS Take 1 capsule by mouth daily.        Marland Kitchen omeprazole (PRILOSEC) 40 MG capsule Take 1 capsule (40 mg total) by mouth  2 (two) times daily.  180 capsule  1  . pravastatin (PRAVACHOL) 40 MG tablet Take 40 mg by mouth 2 (two) times daily.        . Pyridoxine HCl (VITAMIN B-6) 250 MG tablet Take 500 mg by mouth daily.        . sertraline (ZOLOFT) 50 MG tablet Take 1.5 tablets (75 mg total) by mouth Nightly. Taking 52m daily  135 tablet  3  . sodium chloride (OCEAN NASAL SPRAY) 0.65 % nasal spray 1 spray by Nasal route as needed for congestion.  45 mL  3   Family History  Problem Relation Age of Onset  . Diabetes Mother   . Hypertension Mother   . Diabetes Father   . Hypertension Father    History   Social History  . Marital Status: Married    Spouse Name: N/A    Number of Children: 0  . Years of Education: 10th grade   Occupational History  . lawn mower     self-employed   Social History Main Topics  . Smoking status: Current Everyday Smoker -- 0.5 packs/day for 35 years    Types: Cigarettes  . Smokeless tobacco: Current User      Types: Chew   Comment: trying to quit/ USES SMOKELESS CIGARETTES  . Alcohol Use: No  . Drug Use: No  . Sexually Active: None   Other Topics Concern  . 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  Patient is a well-developed and well-nourished woman in no acute distress and cooperative with exam. Eyes: PERRL, EOMI, conjunctivae normal, No scleral icterus.  Abdominal: Soft. Non-tender, non-distended, bowel sounds are normal, no masses, or guarding present. Spongy, fibrous tissue present in LLQ, less notable in RLQ. No inguinal hernia present with cough. Neurological: A&Ox 3, cranial nerve II-XII are grossly intact.  Pelvic: external genitalia is free of lesions, slightly erythematous in peri-introitus area, no cervical motion tenderness or pain with bimanual exam of adnexa. No masses felt.  Psychiatric: Normal mood and affect. speech and behavior is normal. Judgment and thought content normal. Cognition and memory are normal.  Foot: bunion present on Left foot. Reddened area proximal to the left 2nd toe nail bed. Thickened yellow toenails diffusely with sparing of 1-2 nails on R foot.  Assessment & Plan:

## 2011-02-03 NOTE — Patient Instructions (Signed)
Please continue to work on your diet and try to lose some weight. Will call with any abnormal test results.

## 2011-02-03 NOTE — Assessment & Plan Note (Signed)
Continue terbinafine and topical OTC solution. Try to go to podiatrist for reddened nail bed.

## 2011-02-03 NOTE — Assessment & Plan Note (Signed)
Check HgA1c at next visit and determine if patient should be started on metformin. Has only been 1 month since her last reading at this visit so not performed yet.

## 2011-02-03 NOTE — Assessment & Plan Note (Signed)
Will check GC and chlamydia. Checking TSH and RPR as well. Patient reports pain with position so it is likely musculoskeletal in origin. Advised her to use a pillow between her legs when laying on her side to keep alignment neutral.

## 2011-02-03 NOTE — Assessment & Plan Note (Signed)
Pap smear performed today. 

## 2011-02-03 NOTE — Assessment & Plan Note (Signed)
Will check TSH on 148mg of levothyroxine.

## 2011-02-04 LAB — GC/CHLAMYDIA PROBE AMP, GENITAL
Chlamydia, DNA Probe: NEGATIVE
GC Probe Amp, Genital: NEGATIVE

## 2011-02-04 LAB — TSH: TSH: 2.549 u[IU]/mL (ref 0.350–4.500)

## 2011-02-04 LAB — RPR

## 2011-02-04 LAB — WET PREP BY MOLECULAR PROBE
Candida species: NEGATIVE
Gardnerella vaginalis: NEGATIVE
Trichomonas vaginosis: NEGATIVE

## 2011-02-04 LAB — HIV ANTIBODY (ROUTINE TESTING W REFLEX): HIV: NONREACTIVE

## 2011-02-05 NOTE — Progress Notes (Addendum)
I agree with assessment and plan as per Dr. Marcello Moores.

## 2011-02-14 ENCOUNTER — Emergency Department (INDEPENDENT_AMBULATORY_CARE_PROVIDER_SITE_OTHER)
Admission: EM | Admit: 2011-02-14 | Discharge: 2011-02-14 | Disposition: A | Discharge status: Home / Self Care | Payer: Self-pay | Source: Home / Self Care | Attending: Family Medicine | Admitting: Family Medicine

## 2011-02-14 ENCOUNTER — Telehealth: Payer: Self-pay | Admitting: *Deleted

## 2011-02-14 ENCOUNTER — Encounter (HOSPITAL_COMMUNITY): Payer: Self-pay | Admitting: *Deleted

## 2011-02-14 DIAGNOSIS — J329 Chronic sinusitis, unspecified: Secondary | ICD-10-CM

## 2011-02-14 DIAGNOSIS — J45901 Unspecified asthma with (acute) exacerbation: Secondary | ICD-10-CM

## 2011-02-14 MED ORDER — PSEUDOEPH-HYDROCODONE 60-5 MG/5ML PO SOLN: 5.0000 mL | Freq: Three times a day (TID) | ORAL | Status: DC | PRN

## 2011-02-14 MED ORDER — AMOXICILLIN 500 MG PO CAPS: 500.0000 mg | ORAL_CAPSULE | Freq: Three times a day (TID) | ORAL | Status: AC

## 2011-02-14 MED ORDER — PREDNISONE 20 MG PO TABS: ORAL_TABLET | ORAL | Status: AC

## 2011-02-14 NOTE — Telephone Encounter (Signed)
Call from pt stated having congestion.  Coughing up clear -white mucous.  No fevers .  Wants to be seen.  Has been around a child with a cold.  Pt was advised to go to the Urgent Care or the ED for assessment. Sander Nephew, RN 02/14/2011 2:34 PM.

## 2011-02-14 NOTE — ED Provider Notes (Signed)
History     CSN: [id number] Arrival date & time: 02/14/2011  6:28 PM   First MD Initiated Contact with Patient 02/14/11 1720      Chief Complaint  Patient presents with  . Cough  . Nasal Congestion    (Consider location/radiation/quality/duration/timing/severity/associated sxs/prior treatment) HPI Comments: H/o asthma and recurrent sinusitis here c/o cold like symptoms about 2 weeks ago while taking care of her infant grand son who also had a cold. Most symptoms have resolved but has perssitent sinus pressure congestion and non productive cough making her to use her recue inhaler more frequent than usual used 2 times today. She barely uses her albuterol normally. No fever, good apetite. No chest pain.  The history is provided by the patient.    Past Medical History  Diagnosis Date  . Carpal tunnel syndrome on both sides     right hand surgery 1998, left hand surgery 1999.  . Allergic rhinitis   . Asthma   . Depression   . GERD (gastroesophageal reflux disease)   . Hyperlipidemia   . Hypothyroidism   . Psoriasis   . CAD (coronary artery disease)     cath 12/13/10: mRCA 99%, EF 65-70%;  s/p BMS to mRCA  . HTN (hypertension)   . DM2 (diabetes mellitus, type 2)   . Hyperlipidemia     Past Surgical History  Procedure Date  . Coronary angioplasty with stent placement   . Carpal tunnel release     w/ bone spurs  . Dilation and curettage of uterus     Family History  Problem Relation Age of Onset  . Diabetes Mother   . Hypertension Mother   . Diabetes Father   . Hypertension Father     History  Substance Use Topics  . Smoking status: Current Some Day Smoker -- 0.5 packs/day for 35 years    Types: Cigarettes  . Smokeless tobacco: Current User    Types: Chew   Comment: trying to quit/ USES SMOKELESS CIGARETTES  . Alcohol Use: No    OB History    Grav Para Term Preterm Abortions TAB SAB Ect Mult Living                  Review of Systems  Constitutional:  Negative for fever, chills and appetite change.  HENT: Positive for congestion and sinus pressure.   Respiratory: Positive for cough and shortness of breath.     Allergies  Tramadol hcl  Home Medications   Current Outpatient Rx  Name Route Sig Dispense Refill  . ALBUTEROL SULFATE HFA 108 (90 BASE) MCG/ACT IN AERS Inhalation Inhale 2 puffs into the lungs. Every 4 - 6 hours as needed     . ASPIRIN 81 MG PO TABS Oral Take 81 mg by mouth daily.      Marland Kitchen CLOPIDOGREL BISULFATE 75 MG PO TABS Oral Take 75 mg by mouth daily.      Marland Kitchen FLUTICASONE-SALMETEROL 250-50 MCG/DOSE IN AEPB Inhalation Inhale 1 puff into the lungs 2 (two) times daily. Uses as needed     . LEVOTHYROXINE SODIUM 150 MCG PO TABS Oral Take 1 tablet (150 mcg total) by mouth daily. 90 tablet 3  . LISINOPRIL 5 MG PO TABS Oral Take 1 tablet (5 mg total) by mouth daily. 90 tablet 3  . FISH OIL 1360 MG PO CAPS Oral Take 1 capsule by mouth daily.      Marland Kitchen OMEPRAZOLE 40 MG PO CPDR Oral Take 1 capsule (40 mg total) by  mouth 2 (two) times daily. 180 capsule 1  . PRAVASTATIN SODIUM 40 MG PO TABS Oral Take 40 mg by mouth 2 (two) times daily.      . SERTRALINE HCL 50 MG PO TABS Oral Take 1.5 tablets (75 mg total) by mouth Nightly. Taking 53m daily 135 tablet 3  . SALINE NASAL SPRAY 0.65 % NA SOLN Nasal 1 spray by Nasal route as needed for congestion. 45 mL 3  . AMOXICILLIN 500 MG PO CAPS Oral Take 1 capsule (500 mg total) by mouth 3 (three) times daily. 30 capsule 0    Take for 10 days  . PREDNISONE 20 MG PO TABS  2 tabs po daily for 5 days 10 tablet 0  . PSEUDOEPH-HYDROCODONE 60-5 MG/5ML PO SOLN Oral Take 5 mLs by mouth 3 (three) times daily as needed. 80 mL 0    BP 135/91  Pulse 108  Temp(Src) 98.2 F (36.8 C) (Oral)  Resp 20  SpO2 97%  Physical Exam  Nursing note and vitals reviewed. Constitutional: She is oriented to person, place, and time. She appears well-developed and well-nourished. No distress.  HENT:  Head: Normocephalic and  atraumatic.  Right Ear: External ear normal.  Left Ear: External ear normal.       Nasal congestion with swelling of nasal turbinates. Rhinorrhea. Pharyngeal erythema no exudates. Postnasal drip. Reported frontomaxillary sinus pressure and tenderness to palpation.  Eyes: Conjunctivae and EOM are normal. Pupils are equal, round, and reactive to light.  Neck: Neck supple.  Cardiovascular: Normal rate, regular rhythm and normal heart sounds.   Pulmonary/Chest: Effort normal and breath sounds normal. No respiratory distress. She has no wheezes. She has no rales. She exhibits no tenderness.  Lymphadenopathy:    She has no cervical adenopathy.  Neurological: She is alert and oriented to person, place, and time.  Skin: No rash noted.    ED Course  Procedures (including critical care time)  Labs Reviewed - No data to display No results found.   1. Sinusitis   2. Asthma exacerbation       MDM  Treated with amoxicillin, prednisone and (decongestant for short period). Discussed risk for increase blood pressure and blood sugar with decongestant and prednisone. pt will monitor and continue BP and diabetes med's. Encouraged to quit smoking.        ARanda Spike MD 02/15/11 1511

## 2011-02-14 NOTE — ED Notes (Signed)
C/O productive cough "that has turned into dry cough" x 14 days.  C/O head and chest congestion w/ post-nasal drainage.  Has been taking OTC allergy med, Tylenol, Robitussin DM, Delsym (only slight relief), albuterol HFA bid-tid over past 2-3 days.  Unsure if any fevers.

## 2011-03-17 ENCOUNTER — Ambulatory Visit (INDEPENDENT_AMBULATORY_CARE_PROVIDER_SITE_OTHER): Payer: Self-pay | Admitting: Internal Medicine

## 2011-03-17 ENCOUNTER — Ambulatory Visit (HOSPITAL_COMMUNITY)
Admission: RE | Admit: 2011-03-17 | Discharge: 2011-03-17 | Disposition: A | Discharge status: Home / Self Care | Payer: Self-pay | Source: Ambulatory Visit | Attending: Ophthalmology | Admitting: Ophthalmology

## 2011-03-17 ENCOUNTER — Encounter: Payer: Self-pay | Admitting: Internal Medicine

## 2011-03-17 VITALS — BP 120/85 | HR 80 | Temp 97.4°F | Ht 63.5 in | Wt 239.6 lb

## 2011-03-17 DIAGNOSIS — R059 Cough, unspecified: Secondary | ICD-10-CM

## 2011-03-17 DIAGNOSIS — R079 Chest pain, unspecified: Secondary | ICD-10-CM | POA: Insufficient documentation

## 2011-03-17 DIAGNOSIS — E119 Type 2 diabetes mellitus without complications: Secondary | ICD-10-CM

## 2011-03-17 DIAGNOSIS — R05 Cough: Secondary | ICD-10-CM

## 2011-03-17 HISTORY — DX: Cough, unspecified: R05.9

## 2011-03-17 LAB — GLUCOSE, CAPILLARY: Glucose-Capillary: 144 mg/dL — ABNORMAL HIGH (ref 70–99)

## 2011-03-17 LAB — POCT GLYCOSYLATED HEMOGLOBIN (HGB A1C): Hemoglobin A1C: 7

## 2011-03-17 MED ORDER — DOXYCYCLINE HYCLATE 100 MG PO TABS: 100.0000 mg | ORAL_TABLET | Freq: Two times a day (BID) | ORAL | Status: DC

## 2011-03-17 MED ORDER — DOXYCYCLINE HYCLATE 100 MG PO TABS: 100.0000 mg | ORAL_TABLET | Freq: Two times a day (BID) | ORAL | Status: AC

## 2011-03-17 NOTE — Assessment & Plan Note (Addendum)
Atypical chest pain. Has been experiencing chest pain next 2 weeks, very localized to right side had episode of nausea which was similar to discomfort she felt during stent placement. An EKG was obtained which did not show any significant changes. An appointment with her cardiologist was scheduled for Wednesday, 03/18/2010. I recommended the patient if she has worsening symptoms she needs to be seen in the ED urgently. I do not think any changes to her medication regimen at this point.

## 2011-03-17 NOTE — Progress Notes (Signed)
Appt has scheduled w/Dr. Verl Blalock for Wednesday at [facility] [place].

## 2011-03-17 NOTE — Patient Instructions (Addendum)
1. If you experience worsening chest pain you should call 911  to be evaluated in the emergency room for your pain. 2. Please check your sugars twice a day and bring in the meter with you at the next office visit

## 2011-03-17 NOTE — Progress Notes (Signed)
Subjective:   Patient ID: Kendra Adams female   DOB: 1960/05/10 51 y.o.   MRN: [id number]  HPI: Kendra Adams is a 51 y.o. female with past any significant as outlined below who presented to the clinic for chest pain and on cough's since one week. 1. cough started when half weeks ago which has been progressing getting worse." Reports it is productive in nature with whitish tissue yellowish greenish brownish discharge. Noted significant nasal congestion and postnasal dripping. Has hot flashes. No recent travel or sick contact. Unclear she got the flu vaccination. 2. Chest pain which started last week after lifting heavy things. Located on the right-hand side sharp in character nonradiating. She has tried nitroglycerin which improved her pain. Since then has been having twice a day symptoms not associated with exertion. Denies any shortness of breath nausea or or dizziness. Although the patient reports the fact she had one episode of severe nausea on 03/10/2011 culture a heavy meal. She did not drink any alcohol. She felt similar take when she caught her stent placed in October 2012.    Past Medical History  Diagnosis Date  . Carpal tunnel syndrome on both sides     right hand surgery 1998, left hand surgery 1999.  . Allergic rhinitis   . Asthma   . Depression   . GERD (gastroesophageal reflux disease)   . Hyperlipidemia   . Hypothyroidism   . Psoriasis   . CAD (coronary artery disease)     cath 12/13/10: mRCA 99%, EF 65-70%;  s/p BMS to mRCA  . HTN (hypertension)   . DM2 (diabetes mellitus, type 2)   . Hyperlipidemia    Current Outpatient Prescriptions  Medication Sig Dispense Refill  . albuterol (VENTOLIN HFA) 108 (90 BASE) MCG/ACT inhaler Inhale 2 puffs into the lungs. Every 4 - 6 hours as needed       . aspirin 81 MG tablet Take 81 mg by mouth daily.        . clopidogrel (PLAVIX) 75 MG tablet Take 75 mg by mouth daily.        . Fluticasone-Salmeterol (ADVAIR) 250-50 MCG/DOSE AEPB  Inhale 1 puff into the lungs 2 (two) times daily. Uses as needed       . levothyroxine (SYNTHROID, LEVOTHROID) 150 MCG tablet Take 1 tablet (150 mcg total) by mouth daily.  90 tablet  3  . lisinopril (PRINIVIL,ZESTRIL) 5 MG tablet Take 1 tablet (5 mg total) by mouth daily.  90 tablet  3  . Omega-3 Fatty Acids (FISH OIL) 1360 MG CAPS Take 1 capsule by mouth daily.        Marland Kitchen omeprazole (PRILOSEC) 40 MG capsule Take 1 capsule (40 mg total) by mouth 2 (two) times daily.  180 capsule  1  . pravastatin (PRAVACHOL) 40 MG tablet Take 40 mg by mouth 2 (two) times daily.        . Pseudoeph-Hydrocodone 60-5 MG/5ML SOLN Take 5 mLs by mouth 3 (three) times daily as needed.  80 mL  0  . sertraline (ZOLOFT) 50 MG tablet Take 1.5 tablets (75 mg total) by mouth Nightly. Taking 86m daily  135 tablet  3  . sodium chloride (OCEAN NASAL SPRAY) 0.65 % nasal spray 1 spray by Nasal route as needed for congestion.  45 mL  3  Review of Systems: Constitutional: Denies fever, chills, diaphoresis, appetite change and fatigue.  Respiratory: Denies SOB, but noted chest tightness,  and wheezing.   Cardiovascular: Noted  chest pain, palpitations  but denies leg swelling.  Gastrointestinal: Denies nausea, vomiting, abdominal pain, diarrhea, constipation, blood in stool and abdominal distention.  Genitourinary: Denies dysuria, urgency, frequency, hematuria, flank pain and difficulty urinating.  Skin: Denies pallor, rash and wound.  Neurological: Denies dizziness, yncope, weakness, light-headedness, numbness and headaches.    Objective:  Physical Exam: Filed Vitals:   03/17/11 1005  BP: 120/85  Pulse: 80  Temp: 97.4 F (36.3 C)  TempSrc: Oral  Height: 5' 3.5" (1.613 m)  Weight: 239 lb 9.6 oz (108.682 kg)  SpO2: 96%   Constitutional: Vital signs reviewed.  Patient is a well-developed and well-nourished woman in no acute distress and cooperative with exam. Alert and oriented x3.  Mouth: no erythema or exudates, MMM Neck:  Supple,   Cardiovascular: RRR, S1 normal, S2 normal, no MRG, pulses symmetric and intact bilaterally., mild chest wall tenderness on the right side Pulmonary/Chest: few rhonchi at the basis, good air movent,  no wheezes or rales, Abdominal: Soft. Non-tender, non-distended, bowel sounds are normal, no masses, organomegaly, or guarding present.  GU: no CVA tenderness Neurological: A&O x3,no focal motor deficit, sensory intact to light touch bilaterally.  Skin: Warm, dry and intact. No rash, cyanosis, or clubbing.

## 2011-03-18 NOTE — Assessment & Plan Note (Signed)
Most likely bronchitis in combination with sinusitis. Prescribed a seven-day course of doxycycline I recommended over-the-counter cough syrup. I don't think so there is an indication at this point for steroids. Patient has received amoxicillin and prednisone beginning of December for similar symptoms urgent care.

## 2011-03-19 ENCOUNTER — Encounter: Payer: Self-pay | Admitting: Cardiology

## 2011-03-19 ENCOUNTER — Ambulatory Visit (INDEPENDENT_AMBULATORY_CARE_PROVIDER_SITE_OTHER): Payer: Self-pay | Admitting: Cardiology

## 2011-03-19 VITALS — BP 138/92 | HR 96 | Ht 63.0 in | Wt 234.0 lb

## 2011-03-19 DIAGNOSIS — I2510 Atherosclerotic heart disease of native coronary artery without angina pectoris: Secondary | ICD-10-CM

## 2011-03-19 DIAGNOSIS — E785 Hyperlipidemia, unspecified: Secondary | ICD-10-CM

## 2011-03-19 DIAGNOSIS — I10 Essential (primary) hypertension: Secondary | ICD-10-CM

## 2011-03-19 DIAGNOSIS — E118 Type 2 diabetes mellitus with unspecified complications: Secondary | ICD-10-CM

## 2011-03-19 MED ORDER — VARENICLINE TARTRATE 0.5 MG X 11 & 1 MG X 42 PO MISC: ORAL | Status: DC

## 2011-03-19 MED ORDER — METOPROLOL SUCCINATE ER 50 MG PO TB24: 50.0000 mg | ORAL_TABLET | Freq: Every day | ORAL | Status: DC

## 2011-03-19 MED ORDER — VARENICLINE TARTRATE 1 MG PO TABS: 1.0000 mg | ORAL_TABLET | Freq: Two times a day (BID) | ORAL | Status: DC

## 2011-03-19 NOTE — Patient Instructions (Addendum)
Stop taking Plavix.  Your physician recommends that you return for fasting lab work.  Lipids and Liver Panel.  Your physician wants you to follow-up in: October 2013    You will receive a reminder letter in the mail two months in advance. If you don't receive a letter, please call our office to schedule the follow-up appointment.  Your physician has recommended you make the following change in your medication: Begin taking Chantix as directed to help stop smoking.

## 2011-03-19 NOTE — Assessment & Plan Note (Signed)
She can stop her Plavix for her dental extraction. We will not restart it.  She must stop smoking. We have prescribed Chantix.  With a resting heart rate in the 90s, we'll add metoprolol succinate 50 mg q.a.m. This should also help her blood pressure.

## 2011-03-19 NOTE — Progress Notes (Signed)
HPI Kendra Adams comes in today for evaluation and management of her history of unstable angina, history of 2 bare-metal stents to the proximal right coronary artery in October, hyperlipidemia, tobacco use, type 2 diabetes and hypertension.  He is concerned that she did have some dental extraction. She needs to come off Plavix.  He still smokes but has cut way back. Her heart rate is increased today as a pressure. Her last hemoglobin A1c was 7.0. She needs lipids and LFTs drawn.  She wants to know if she can take Chantix.  Past Medical History  Diagnosis Date  . Carpal tunnel syndrome on both sides     right hand surgery 1998, left hand surgery 1999.  . Allergic rhinitis   . Asthma   . Depression   . GERD (gastroesophageal reflux disease)   . Hyperlipidemia   . Hypothyroidism   . Psoriasis   . CAD (coronary artery disease)     cath 12/13/10: mRCA 99%, EF 65-70%;  s/p BMS to mRCA  . HTN (hypertension)   . DM2 (diabetes mellitus, type 2)   . Hyperlipidemia     Current Outpatient Prescriptions  Medication Sig Dispense Refill  . albuterol (VENTOLIN HFA) 108 (90 BASE) MCG/ACT inhaler Inhale 2 puffs into the lungs. Every 4 - 6 hours as needed       . aspirin 81 MG tablet Take 81 mg by mouth daily.        . clopidogrel (PLAVIX) 75 MG tablet Take 75 mg by mouth daily.        Marland Kitchen doxycycline (VIBRA-TABS) 100 MG tablet Take 1 tablet (100 mg total) by mouth 2 (two) times daily.  14 tablet  0  . Fluticasone-Salmeterol (ADVAIR) 250-50 MCG/DOSE AEPB Inhale 1 puff into the lungs 2 (two) times daily. Uses as needed       . levothyroxine (SYNTHROID, LEVOTHROID) 150 MCG tablet Take 1 tablet (150 mcg total) by mouth daily.  90 tablet  3  . lisinopril (PRINIVIL,ZESTRIL) 5 MG tablet Take 1 tablet (5 mg total) by mouth daily.  90 tablet  3  . Omega-3 Fatty Acids (FISH OIL) 1360 MG CAPS Take 1 capsule by mouth daily.        Marland Kitchen omeprazole (PRILOSEC) 40 MG capsule Take 1 capsule (40 mg total) by mouth 2 (two)  times daily.  180 capsule  1  . pravastatin (PRAVACHOL) 40 MG tablet Take 40 mg by mouth 2 (two) times daily.        . sertraline (ZOLOFT) 50 MG tablet Take 1.5 tablets (75 mg total) by mouth Nightly. Taking 68m daily  135 tablet  3  . sodium chloride (OCEAN NASAL SPRAY) 0.65 % nasal spray 1 spray by Nasal route as needed for congestion.  45 mL  3  . terbinafine (LAMISIL) 250 MG tablet Take 250 mg by mouth daily. Prescribed by Dr JMartinique10/2012 for 12 weeks.         Allergies  Allergen Reactions  . Tramadol Hcl     REACTION: nausea    Family History  Problem Relation Age of Onset  . Diabetes Mother   . Hypertension Mother   . Diabetes Father   . Hypertension Father     History   Social History  . Marital Status: Married    Spouse Name: N/A    Number of Children: 0  . Years of Education: 10th grade   Occupational History  . lConservation officer, nature    self-employed  . 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 Smoking status: Current Some Day Smoker -- 0.3 packs/day for 35 years    Types: Cigarettes  . Smokeless tobacco: Current User    Types: Chew   Comment: trying to quit/ USES SMOKELESS CIGARETTES  . Alcohol Use: No  . Drug Use: No  . Sexually Active: Not on file   Other Topics Concern  . Not on file   Social History Narrative   Father died of suicide (gunshot) in 23. 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    ROS ALL NEGATIVE EXCEPT THOSE NOTED IN HPI  PE  General Appearance: well developed, well nourished in no acute distress, obese HEENT: symmetrical face, PERRLA,  Poor dentition Neck: no JVD, thyromegaly, or adenopathy, trachea midline Chest: symmetric without deformity Cardiac: PMI non-displaced, RRR, normal S1, S2, no gallop or murmur Lung: clear to ausculation and percussion Vascular: all pulses full without bruits  Abdominal: nondistended,  nontender, good bowel sounds, no HSM, no bruits Extremities: no cyanosis, clubbing or edema, no sign of DVT, no varicosities  Skin: normal color, no rashes Neuro: alert and oriented x 3, non-focal Pysch: normal affect  EKG  BMET    Component Value Date/Time   NA 138 12/14/2010 0500   K 3.9 12/14/2010 0500   CL 103 12/14/2010 0500   CO2 27 12/14/2010 0500   GLUCOSE 136* 12/14/2010 0500   BUN 9 12/14/2010 0500   CREATININE 0.55 12/14/2010 0500   CREATININE 0.62 05/29/2010 1607   CALCIUM 9.5 12/14/2010 0500   GFRNONAA >90 12/14/2010 0500   GFRAA >90 12/14/2010 0500    Lipid Panel     Component Value Date/Time   CHOL 192 12/13/2010 0335   TRIG 375* 12/13/2010 0335   HDL 24* 12/13/2010 0335   CHOLHDL 8.0 12/13/2010 0335   VLDL 75* 12/13/2010 0335   LDLCALC 93 12/13/2010 0335    CBC    Component Value Date/Time   WBC 15.0* 12/14/2010 0500   RBC 4.84 12/14/2010 0500   HGB 14.7 12/14/2010 0500   HCT 42.7 12/14/2010 0500   PLT 254 12/14/2010 0500   MCV 88.2 12/14/2010 0500   MCH 30.4 12/14/2010 0500   MCHC 34.4 12/14/2010 0500   RDW 13.5 12/14/2010 0500   LYMPHSABS 3.5 12/11/2010 2116   MONOABS 0.7 12/11/2010 2116   EOSABS 0.4 12/11/2010 2116   BASOSABS 0.1 12/11/2010 2116

## 2011-03-21 ENCOUNTER — Other Ambulatory Visit (INDEPENDENT_AMBULATORY_CARE_PROVIDER_SITE_OTHER): Payer: Self-pay | Admitting: *Deleted

## 2011-03-21 ENCOUNTER — Other Ambulatory Visit: Payer: Self-pay | Admitting: Cardiology

## 2011-03-21 DIAGNOSIS — E785 Hyperlipidemia, unspecified: Secondary | ICD-10-CM

## 2011-03-21 DIAGNOSIS — E118 Type 2 diabetes mellitus with unspecified complications: Secondary | ICD-10-CM

## 2011-03-21 LAB — LDL CHOLESTEROL, DIRECT: Direct LDL: 142.4 mg/dL

## 2011-03-21 LAB — HEPATIC FUNCTION PANEL
ALT: 19 U/L (ref 0–35)
AST: 17 U/L (ref 0–37)
Albumin: 3.8 g/dL (ref 3.5–5.2)
Alkaline Phosphatase: 110 U/L (ref 39–117)
Bilirubin, Direct: 0 mg/dL (ref 0.0–0.3)
Total Bilirubin: 0.3 mg/dL (ref 0.3–1.2)
Total Protein: 6.8 g/dL (ref 6.0–8.3)

## 2011-03-21 LAB — LIPID PANEL
Cholesterol: 211 mg/dL — ABNORMAL HIGH (ref 0–200)
HDL: 36.9 mg/dL — ABNORMAL LOW (ref >39.00)
Total CHOL/HDL Ratio: 6
Triglycerides: 170 mg/dL — ABNORMAL HIGH (ref 0.0–149.0)
VLDL: 34 mg/dL (ref 0.0–40.0)

## 2011-03-25 ENCOUNTER — Ambulatory Visit: Payer: Self-pay | Admitting: Cardiology

## 2011-03-27 ENCOUNTER — Other Ambulatory Visit: Payer: Self-pay | Admitting: *Deleted

## 2011-03-27 DIAGNOSIS — E785 Hyperlipidemia, unspecified: Secondary | ICD-10-CM

## 2011-03-27 MED ORDER — ATORVASTATIN CALCIUM 80 MG PO TABS: 80.0000 mg | ORAL_TABLET | Freq: Every day | ORAL | Status: DC

## 2011-04-11 ENCOUNTER — Other Ambulatory Visit: Payer: Self-pay | Admitting: Internal Medicine

## 2011-04-18 ENCOUNTER — Encounter: Payer: Self-pay | Admitting: Ophthalmology

## 2011-05-02 ENCOUNTER — Ambulatory Visit (INDEPENDENT_AMBULATORY_CARE_PROVIDER_SITE_OTHER): Payer: Self-pay | Admitting: Ophthalmology

## 2011-05-02 ENCOUNTER — Encounter: Payer: Self-pay | Admitting: Ophthalmology

## 2011-05-02 VITALS — BP 126/85 | HR 79 | Temp 97.0°F | Ht 63.5 in | Wt 247.3 lb

## 2011-05-02 DIAGNOSIS — E118 Type 2 diabetes mellitus with unspecified complications: Secondary | ICD-10-CM

## 2011-05-02 DIAGNOSIS — Z0000 Encounter for general adult medical examination without abnormal findings: Secondary | ICD-10-CM

## 2011-05-02 DIAGNOSIS — G8929 Other chronic pain: Secondary | ICD-10-CM

## 2011-05-02 DIAGNOSIS — F329 Major depressive disorder, single episode, unspecified: Secondary | ICD-10-CM

## 2011-05-02 DIAGNOSIS — E785 Hyperlipidemia, unspecified: Secondary | ICD-10-CM

## 2011-05-02 DIAGNOSIS — E119 Type 2 diabetes mellitus without complications: Secondary | ICD-10-CM | POA: Insufficient documentation

## 2011-05-02 DIAGNOSIS — R1032 Left lower quadrant pain: Secondary | ICD-10-CM

## 2011-05-02 DIAGNOSIS — I10 Essential (primary) hypertension: Secondary | ICD-10-CM

## 2011-05-02 LAB — CBC
HCT: 43 % (ref 36.0–46.0)
Hemoglobin: 14.3 g/dL (ref 12.0–15.0)
MCH: 29.7 pg (ref 26.0–34.0)
MCHC: 33.3 g/dL (ref 30.0–36.0)
MCV: 89.2 fL (ref 78.0–100.0)
Platelets: 278 10*3/uL (ref 150–400)
RBC: 4.82 MIL/uL (ref 3.87–5.11)
RDW: 13.9 % (ref 11.5–15.5)
WBC: 11.8 10*3/uL — ABNORMAL HIGH (ref 4.0–10.5)

## 2011-05-02 LAB — GLUCOSE, CAPILLARY: Glucose-Capillary: 101 mg/dL — ABNORMAL HIGH (ref 70–99)

## 2011-05-02 LAB — FERRITIN: Ferritin: 24 ng/mL (ref 10–291)

## 2011-05-02 MED ORDER — METFORMIN HCL 500 MG PO TABS: 500.0000 mg | ORAL_TABLET | Freq: Two times a day (BID) | ORAL | Status: DC

## 2011-05-02 NOTE — Assessment & Plan Note (Signed)
Well controlled currently.

## 2011-05-02 NOTE — Assessment & Plan Note (Signed)
Well controlled 

## 2011-05-02 NOTE — Assessment & Plan Note (Addendum)
Patient has had this pain since she saw me last in November. Concerning for GI or gynecologic pathology. She had colonoscopy which was essentially normal in 2011 so will send her for pelvic ultrasound. She will have to pay out of pocket.

## 2011-05-02 NOTE — Assessment & Plan Note (Addendum)
She is due for mammogram, last done in 2010. Please discuss at next visit.

## 2011-05-02 NOTE — Patient Instructions (Addendum)
-  Go have pelvic ultrasound done to evaluate your lower belly pain. -Please keep measuring your blood sugar sp we can see how metformin is working

## 2011-05-02 NOTE — Progress Notes (Signed)
Subjective:   Patient ID: Kendra Adams female   DOB: 03/03/1961 51 y.o.   MRN: [id number]  HPI: [name] is a 51 y.o. presents for routine follow up  Smoking cessation: patient has been trying to quit smoking since January when she saw her cardiologist. Had samples of chantix from her sister in law. Is tempted to smoke cigarettes since she is around people who smoke at home.  She has a HgA1c that has been diabetic since October. Is not on any medications currently but she has a blood sugar log for the last 2 months since she saw Kendra Adams.   She recently saw cardiologist Kendra Adams and he wanted her to stop plavix and started metoprolol.  She has not needed inhalers since she stopped smoking. She was getting coughing fits and these are also better.  Is also concerned for pain in left lower abdomen since November. Improves with applying pressure to the area. Is not constipated. Will have 2-3 stools a day. Has blood in stool occasionally, is mostly on outside of stool. Had noticed before she had colonoscopy 2 years ago. Had 2 polyps removed. Then bleeding stopped and has restarted in last couple of months. Doesn't take any ibuprofen or other pain meds. Gastroenterologist told not to take.   Has sweats at night. Not enough to have to change the sheets or changes pajamas. No fevers that she knows of. Has lost quite a bit of weight in past 2 years, she she relates to being depressed and not feeling hungry.   Past Medical History  Diagnosis Date  . Carpal tunnel syndrome on both sides     right hand surgery 1998, left hand surgery 1999.  . Allergic rhinitis   . Asthma   . Depression   . GERD (gastroesophageal reflux disease)   . Hyperlipidemia   . Hypothyroidism   . Psoriasis   . CAD (coronary artery disease)     cath 12/13/10: mRCA 99%, EF 65-70%;  s/p BMS to mRCA  . HTN (hypertension)   . DM2 (diabetes mellitus, type 2)   . Hyperlipidemia    Current Outpatient Prescriptions    Medication Sig Dispense Refill  . aspirin 81 MG tablet Take 81 mg by mouth daily.        Marland Kitchen atorvastatin (LIPITOR) 80 MG tablet Take 1 tablet (80 mg total) by mouth at bedtime.  30 tablet  11  . Fluticasone-Salmeterol (ADVAIR) 250-50 MCG/DOSE AEPB Inhale 1 puff into the lungs 2 (two) times daily. Uses as needed       . levothyroxine (SYNTHROID, LEVOTHROID) 150 MCG tablet Take 1 tablet (150 mcg total) by mouth daily.  90 tablet  3  . lisinopril (PRINIVIL,ZESTRIL) 5 MG tablet Take 1 tablet (5 mg total) by mouth daily.  90 tablet  3  . metoprolol (TOPROL XL) 50 MG 24 hr tablet Take 1 tablet (50 mg total) by mouth daily.  30 tablet  11  . Omega-3 Fatty Acids (FISH OIL) 1360 MG CAPS Take 1 capsule by mouth daily.        Marland Kitchen omeprazole (PRILOSEC) 40 MG capsule Take 1 capsule (40 mg total) by mouth 2 (two) times daily.  180 capsule  1  . sertraline (ZOLOFT) 50 MG tablet Take 1.5 tablets (75 mg total) by mouth Nightly. Taking 41m daily  135 tablet  3  . sodium chloride (OCEAN NASAL SPRAY) 0.65 % nasal spray 1 spray by Nasal route as needed for congestion.  45 mL  3  . terbinafine (LAMISIL) 250 MG tablet Take 250 mg by mouth daily. Prescribed by Kendra Adams 12/2010 for 12 weeks.       . varenicline (CHANTIX CONTINUING MONTH PAK) 1 MG tablet Take 1 tablet (1 mg total) by mouth 2 (two) times daily.  60 tablet  1  . varenicline (CHANTIX STARTING MONTH PAK) 0.5 MG X 11 & 1 MG X 42 tablet Take one 0.39m tablet by mouth once daily for 3 days, then increase to one 0.583mtablet twice daily for 3 days, then increase to one 47m23mablet twice daily.  53 tablet  0  . VENTOLIN HFA 108 (90 BASE) MCG/ACT inhaler INHALE 2 PUFFS BY MOUTH EVERY 4 TO 6 HOURS AS NEEDED  18 g  5   Family History  Problem Relation Age of Onset  . Diabetes Mother   . Hypertension Mother   . Diabetes Father   . Hypertension Father    History   Social History  . Marital Status: Married    Spouse Name: N/A    Number of Children: 0  . Years  of Education: 10th grade   Occupational History  . 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 Smoking status: Current Some Day Smoker -- 0.3 packs/day for 35 years    Types: Cigarettes  . Smokeless tobacco: Current User    Types: Chew   Comment: trying to quit/ USES SMOKELESS CIGARETTES  . Alcohol Use: No  . Drug Use: No  . Sexually Active: Not on file   Other Topics Concern  . 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 [employer]  Objective:  Physical Exam: Filed Vitals:   05/02/11 1515  BP: 126/85  Pulse: 79  Temp: 97 F (36.1 C)  TempSrc: Oral  Height: 5' 3.5" (1.613 m)  Weight: 247 lb 4.8 oz (112.175 kg)  SpO2: 96%   General: obese talkative woman sitting in chair in no acute distress HEENT: PERRL, EOMI, no scleral icterus Cardiac: RRR, no rubs, murmurs or gallops Pulm: clear to auscultation bilaterally, moving normal volumes of air Abd: soft, mild tenderness to left lower quadrant in focal area, nondistended, BS present, no visible hernia or mass Ext: warm and well perfused, no pedal edema Hip Exam: no pain elicited by hip flexion, extension, internal or external rotation or palpation of joints Neuro: alert and oriented X3, cranial nerves II-XII grossly intact  Assessment & Plan:

## 2011-05-02 NOTE — Assessment & Plan Note (Signed)
Starting patient on metformin 500 mg BID. Her blood sugars have been 90-160 mostly in 140s-150s. Asked her to keep measuring blood sugar and educated her about the symptoms of hypoglycemia. Will return in 1 month. Should refer to [facility] since new diagnosis.

## 2011-05-08 ENCOUNTER — Other Ambulatory Visit: Payer: Self-pay

## 2011-05-15 ENCOUNTER — Other Ambulatory Visit (INDEPENDENT_AMBULATORY_CARE_PROVIDER_SITE_OTHER): Payer: Self-pay

## 2011-05-15 DIAGNOSIS — E785 Hyperlipidemia, unspecified: Secondary | ICD-10-CM

## 2011-05-16 ENCOUNTER — Telehealth: Payer: Self-pay | Admitting: *Deleted

## 2011-05-16 LAB — BASIC METABOLIC PANEL
BUN: 12 mg/dL (ref 6–23)
CO2: 29 mEq/L (ref 19–32)
Calcium: 9 mg/dL (ref 8.4–10.5)
Chloride: 103 mEq/L (ref 96–112)
Creatinine, Ser: 0.6 mg/dL (ref 0.4–1.2)
GFR: 105.35 mL/min (ref >60.00)
Glucose, Bld: 97 mg/dL (ref 70–99)
Potassium: 4.4 mEq/L (ref 3.5–5.1)
Sodium: 137 mEq/L (ref 135–145)

## 2011-05-16 LAB — HEPATIC FUNCTION PANEL
ALT: 19 U/L (ref 0–35)
AST: 19 U/L (ref 0–37)
Albumin: 3.8 g/dL (ref 3.5–5.2)
Alkaline Phosphatase: 107 U/L (ref 39–117)
Bilirubin, Direct: 0 mg/dL (ref 0.0–0.3)
Total Bilirubin: 0.2 mg/dL — ABNORMAL LOW (ref 0.3–1.2)
Total Protein: 6.5 g/dL (ref 6.0–8.3)

## 2011-05-16 LAB — LIPID PANEL
Cholesterol: 142 mg/dL (ref 0–200)
HDL: 34.2 mg/dL — ABNORMAL LOW (ref >39.00)
LDL Cholesterol: 79 mg/dL (ref 0–99)
Total CHOL/HDL Ratio: 4
Triglycerides: 146 mg/dL (ref 0.0–149.0)
VLDL: 29.2 mg/dL (ref 0.0–40.0)

## 2011-05-16 NOTE — Telephone Encounter (Signed)
Pt is aware of lab results. She would like Dr. Verl Blalock to know she has quit smoking. She has changed her dietary habits.   Her CBGs are better at home. She is working on cutting out the candy bars.   Horton Chin RN

## 2011-05-16 NOTE — Telephone Encounter (Signed)
Message copied by Verna Czech on Fri May 16, 2011  3:43 PM ------      Message from: Jenell Milliner C      Created: Fri May 16, 2011  1:19 PM       Numbers look good. No change in treatment.

## 2011-06-01 ENCOUNTER — Emergency Department (HOSPITAL_COMMUNITY)
Admission: EM | Admit: 2011-06-01 | Discharge: 2011-06-01 | Disposition: A | Discharge status: Home / Self Care | Payer: Self-pay | Attending: Emergency Medicine | Admitting: Emergency Medicine

## 2011-06-01 ENCOUNTER — Encounter (HOSPITAL_COMMUNITY): Payer: Self-pay | Admitting: *Deleted

## 2011-06-01 DIAGNOSIS — R000 Tachycardia, unspecified: Secondary | ICD-10-CM | POA: Insufficient documentation

## 2011-06-01 DIAGNOSIS — G8929 Other chronic pain: Secondary | ICD-10-CM | POA: Insufficient documentation

## 2011-06-01 DIAGNOSIS — R112 Nausea with vomiting, unspecified: Secondary | ICD-10-CM | POA: Insufficient documentation

## 2011-06-01 DIAGNOSIS — R1110 Vomiting, unspecified: Secondary | ICD-10-CM | POA: Insufficient documentation

## 2011-06-01 DIAGNOSIS — I10 Essential (primary) hypertension: Secondary | ICD-10-CM | POA: Insufficient documentation

## 2011-06-01 DIAGNOSIS — E119 Type 2 diabetes mellitus without complications: Secondary | ICD-10-CM | POA: Insufficient documentation

## 2011-06-01 DIAGNOSIS — J4489 Other specified chronic obstructive pulmonary disease: Secondary | ICD-10-CM | POA: Insufficient documentation

## 2011-06-01 DIAGNOSIS — J449 Chronic obstructive pulmonary disease, unspecified: Secondary | ICD-10-CM | POA: Insufficient documentation

## 2011-06-01 DIAGNOSIS — R5383 Other fatigue: Secondary | ICD-10-CM | POA: Insufficient documentation

## 2011-06-01 DIAGNOSIS — R197 Diarrhea, unspecified: Secondary | ICD-10-CM | POA: Insufficient documentation

## 2011-06-01 DIAGNOSIS — Z951 Presence of aortocoronary bypass graft: Secondary | ICD-10-CM | POA: Insufficient documentation

## 2011-06-01 DIAGNOSIS — E785 Hyperlipidemia, unspecified: Secondary | ICD-10-CM | POA: Insufficient documentation

## 2011-06-01 DIAGNOSIS — Z7982 Long term (current) use of aspirin: Secondary | ICD-10-CM | POA: Insufficient documentation

## 2011-06-01 DIAGNOSIS — R5381 Other malaise: Secondary | ICD-10-CM | POA: Insufficient documentation

## 2011-06-01 DIAGNOSIS — R109 Unspecified abdominal pain: Secondary | ICD-10-CM | POA: Insufficient documentation

## 2011-06-01 LAB — COMPREHENSIVE METABOLIC PANEL
ALT: 21 U/L (ref 0–35)
AST: 18 U/L (ref 0–37)
Albumin: 3.7 g/dL (ref 3.5–5.2)
Alkaline Phosphatase: 114 U/L (ref 39–117)
BUN: 12 mg/dL (ref 6–23)
CO2: 27 mEq/L (ref 19–32)
Calcium: 9.4 mg/dL (ref 8.4–10.5)
Chloride: 102 mEq/L (ref 96–112)
Creatinine, Ser: 0.55 mg/dL (ref 0.50–1.10)
GFR calc Af Amer: >90 mL/min (ref >90)
GFR calc non Af Amer: >90 mL/min (ref >90)
Glucose, Bld: 138 mg/dL — ABNORMAL HIGH (ref 70–99)
Potassium: 3.8 mEq/L (ref 3.5–5.1)
Sodium: 139 mEq/L (ref 135–145)
Total Bilirubin: 0.5 mg/dL (ref 0.3–1.2)
Total Protein: 6.7 g/dL (ref 6.0–8.3)

## 2011-06-01 LAB — URINALYSIS, ROUTINE W REFLEX MICROSCOPIC
Bilirubin Urine: NEGATIVE
Glucose, UA: NEGATIVE mg/dL
Hgb urine dipstick: NEGATIVE
Ketones, ur: NEGATIVE mg/dL
Leukocytes, UA: NEGATIVE
Nitrite: NEGATIVE
Protein, ur: NEGATIVE mg/dL
Specific Gravity, Urine: 1.011 (ref 1.005–1.030)
Urobilinogen, UA: 0.2 mg/dL (ref 0.0–1.0)
pH: 6.5 (ref 5.0–8.0)

## 2011-06-01 LAB — CBC
HCT: 43.3 % (ref 36.0–46.0)
Hemoglobin: 14.9 g/dL (ref 12.0–15.0)
MCH: 30.7 pg (ref 26.0–34.0)
MCHC: 34.4 g/dL (ref 30.0–36.0)
MCV: 89.3 fL (ref 78.0–100.0)
Platelets: 232 10*3/uL (ref 150–400)
RBC: 4.85 MIL/uL (ref 3.87–5.11)
RDW: 14.1 % (ref 11.5–15.5)
WBC: 9.4 10*3/uL (ref 4.0–10.5)

## 2011-06-01 LAB — GLUCOSE, CAPILLARY: Glucose-Capillary: 145 mg/dL — ABNORMAL HIGH (ref 70–99)

## 2011-06-01 LAB — DIFFERENTIAL
Basophils Absolute: 0 10*3/uL (ref 0.0–0.1)
Basophils Relative: 0 % (ref 0–1)
Eosinophils Absolute: 0.1 10*3/uL (ref 0.0–0.7)
Eosinophils Relative: 1 % (ref 0–5)
Lymphocytes Relative: 10 % — ABNORMAL LOW (ref 12–46)
Lymphs Abs: 1 10*3/uL (ref 0.7–4.0)
Monocytes Absolute: 0.6 10*3/uL (ref 0.1–1.0)
Monocytes Relative: 6 % (ref 3–12)
Neutro Abs: 7.8 10*3/uL — ABNORMAL HIGH (ref 1.7–7.7)
Neutrophils Relative %: 82 % — ABNORMAL HIGH (ref 43–77)

## 2011-06-01 LAB — LIPASE, BLOOD: Lipase: 16 U/L (ref 11–59)

## 2011-06-01 MED ORDER — SODIUM CHLORIDE 0.9 % IV BOLUS (SEPSIS)
2000.0000 mL | Freq: Once | INTRAVENOUS | Status: AC
  Administered 2011-06-01: 1000 mL via INTRAVENOUS

## 2011-06-01 MED ORDER — SODIUM CHLORIDE 0.9 % IV SOLN
INTRAVENOUS | Status: DC
  Administered 2011-06-01: 18:00:00 via INTRAVENOUS

## 2011-06-01 MED ORDER — ONDANSETRON HCL 4 MG/2ML IJ SOLN
4.0000 mg | Freq: Once | INTRAMUSCULAR | Status: AC
Start: 2011-06-01 — End: 2011-06-01
  Administered 2011-06-01: 4 mg via INTRAVENOUS
  Filled 2011-06-01: qty 2

## 2011-06-01 MED ORDER — LOPERAMIDE HCL 2 MG PO CAPS: ORAL_CAPSULE | ORAL | Status: AC

## 2011-06-01 MED ORDER — ONDANSETRON 8 MG PO TBDP: ORAL_TABLET | ORAL | Status: AC

## 2011-06-01 MED ORDER — METOCLOPRAMIDE HCL 10 MG PO TABS: 10.0000 mg | ORAL_TABLET | Freq: Four times a day (QID) | ORAL | Status: AC | PRN

## 2011-06-01 NOTE — ED Notes (Signed)
Pt presents to department for evaluation of N/V, abdominal pain and bloody stools. Pt states bloody stools x1 month. Also states 7/10 diffuse abdominal pain with several episodes of vomiting. No bloody stools today. Abdomen tender to palpation. Bowel sounds present all quadrants. Skin warm and dry. She is alert and oriented x4. No signs of distress noted at the time.

## 2011-06-01 NOTE — ED Provider Notes (Signed)
History     CSN: [id number]  Arrival date & time 06/01/11  1429   First MD Initiated Contact with Patient 06/01/11 1523      Chief Complaint  Patient presents with  . Emesis  . Diarrhea    (Consider location/radiation/quality/duration/timing/severity/associated sxs/prior treatment) HPI This 51 year old female has chronic abdominal pain 24 hours a day for several months mostly localized left lower quadrant described as a mild chronic baseline pain, for the last 3-4 weeks she has also noticed some intermittent bright red blood mixed in with her form stools, now she describes about 24 hours of nonbloody vomiting and nonbloody diarrhea multiple episodes of each with no change in her baseline chronic abdominal pain which is still very mild to the left lower quadrant. She is no fever no confusion no chest pain no shortness of breath no body aches. There's been no treatment prior to arrival. She has prior diverticulosis diagnosed by colonoscopy but no episodes of diverticulitis. Past Medical History  Diagnosis Date  . Carpal tunnel syndrome on both sides     right hand surgery 1998, left hand surgery 1999.  . Allergic rhinitis   . Asthma   . Depression   . GERD (gastroesophageal reflux disease)   . Hyperlipidemia   . Hypothyroidism   . Psoriasis   . CAD (coronary artery disease)     cath 12/13/10: mRCA 99%, EF 65-70%;  s/p BMS to mRCA  . HTN (hypertension)   . DM2 (diabetes mellitus, type 2)   . Hyperlipidemia    asthma, COPD, former smoker  Past Surgical History  Procedure Date  . Coronary angioplasty with stent placement   . Carpal tunnel release     w/ bone spurs  . Dilation and curettage of uterus     Family History  Problem Relation Age of Onset  . Diabetes Mother   . Hypertension Mother   . Diabetes Father   . Hypertension Father     History  Substance Use Topics  . Smoking status: Current Some Day Smoker -- 0.2 packs/day for 35 years    Types: Cigarettes  .  Smokeless tobacco: Current User    Types: Chew   Comment: trying to quit/ USES SMOKELESS CIGARETTES.  Cutting back." '"`  . Alcohol Use: No    OB History    Grav Para Term Preterm Abortions TAB SAB Ect Mult Living                  Review of Systems  Constitutional: Negative for fever.       10 Systems reviewed and are negative for acute change except as noted in the HPI.  HENT: Negative for congestion.   Eyes: Negative for discharge and redness.  Respiratory: Negative for cough and shortness of breath.   Cardiovascular: Negative for chest pain.  Gastrointestinal: Positive for nausea, vomiting, abdominal pain and diarrhea.  Genitourinary: Negative for dysuria.  Musculoskeletal: Negative for back pain.  Skin: Negative for rash.  Neurological: Positive for weakness. Negative for syncope, numbness and headaches.  Psychiatric/Behavioral:       No behavior change.    Allergies  Tramadol hcl  Home Medications   Current Outpatient Rx  Name Route Sig Dispense Refill  . ALBUTEROL SULFATE HFA 108 (90 BASE) MCG/ACT IN AERS Inhalation Inhale 2 puffs into the lungs every 4 (four) hours as needed. For shortness of breath    . ASPIRIN 325 MG PO TABS Oral Take 81 mg by mouth daily.    Marland Kitchen  ATORVASTATIN CALCIUM 80 MG PO TABS Oral Take 80 mg by mouth at bedtime.    Marland Kitchen LEVOTHYROXINE SODIUM 150 MCG PO TABS Oral Take 150 mcg by mouth daily.    Marland Kitchen LISINOPRIL 5 MG PO TABS Oral Take 5 mg by mouth daily.    Marland Kitchen METFORMIN HCL 500 MG PO TABS Oral Take 500 mg by mouth 2 (two) times daily with a meal.    . METOPROLOL SUCCINATE ER 50 MG PO TB24 Oral Take 50 mg by mouth daily.    Marland Kitchen FISH OIL 1360 MG PO CAPS Oral Take 1 capsule by mouth daily.     Marland Kitchen OMEPRAZOLE 40 MG PO CPDR Oral Take 40 mg by mouth 2 (two) times daily.    . SERTRALINE HCL 50 MG PO TABS Oral Take 75 mg by mouth daily.    Marland Kitchen VARENICLINE TARTRATE 1 MG PO TABS Oral Take 1 mg by mouth 2 (two) times daily.    Marland Kitchen LOPERAMIDE HCL 2 MG PO CAPS  Take two  tabs po initially, then one tab after each loose stool: max 8 tabs in 24 hours 12 capsule 0  . METOCLOPRAMIDE HCL 10 MG PO TABS Oral Take 1 tablet (10 mg total) by mouth every 6 (six) hours as needed (nausea/headache). 6 tablet 0  . ONDANSETRON 8 MG PO TBDP  53m ODT q4 hours prn nausea 4 tablet 0    BP 143/81  Pulse 86  Temp(Src) 98.1 F (36.7 C) (Oral)  Resp 16  Ht 5' 3"  (1.6 m)  Wt 248 lb (112.492 kg)  BMI 43.93 kg/m2  SpO2 98%  Physical Exam  Nursing note and vitals reviewed. Constitutional:       Awake, alert, nontoxic appearance.  HENT:  Head: Atraumatic.  Eyes: Right eye exhibits no discharge. Left eye exhibits no discharge.  Neck: Neck supple.  Cardiovascular: Regular rhythm.   No murmur heard.      Tachycardic rate  Pulmonary/Chest: Effort normal and breath sounds normal. No respiratory distress. She has no wheezes. She has no rales. She exhibits no tenderness.  Abdominal: Soft. Bowel sounds are normal. She exhibits no distension and no mass. There is tenderness. There is no rebound and no guarding.       Minimal left lower quadrant tenderness with rest the abdomen nontender the patient states this is baseline tenderness if not less tender than at baseline for her  Musculoskeletal: She exhibits no tenderness.       Baseline ROM, no obvious new focal weakness.  Neurological:       Mental status and motor strength appears baseline for patient and situation.  Skin: No rash noted.  Psychiatric: She has a normal mood and affect.    ED Course  Procedures (including critical care time)  Labs Reviewed  GLUCOSE, CAPILLARY - Abnormal; Notable for the following:    Glucose-Capillary 145 (*)    All other components within normal limits  DIFFERENTIAL - Abnormal; Notable for the following:    Neutrophils Relative 82 (*)    Neutro Abs 7.8 (*)    Lymphocytes Relative 10 (*)    All other components within normal limits  COMPREHENSIVE METABOLIC PANEL - Abnormal; Notable for the  following:    Glucose, Bld 138 (*)    All other components within normal limits  CBC  LIPASE, BLOOD  URINALYSIS, ROUTINE W REFLEX MICROSCOPIC  LAB REPORT - SCANNED   No results found.   1. Vomiting and diarrhea   2. Chronic abdominal pain  MDM  Patient / Family / Caregiver understand and agree with initial ED impression and plan with expectations set for ED visit.  Pt stable in ED with no significant deterioration in condition.  Patient / Family / Caregiver informed of clinical course, understand medical decision-making process, and agree with plan.  I doubt any other EMC precluding discharge at this time including, but not necessarily limited to the following:SBI.        Babette Relic, MD 06/02/11 [id number]

## 2011-06-01 NOTE — ED Notes (Signed)
Pt reports n/v/d since yesterday, unable to eat or drink, has severe headache. Mask on pt at triage.

## 2011-06-01 NOTE — Discharge Instructions (Signed)

## 2011-06-21 ENCOUNTER — Other Ambulatory Visit: Payer: Self-pay | Admitting: Ophthalmology

## 2011-07-09 ENCOUNTER — Ambulatory Visit (INDEPENDENT_AMBULATORY_CARE_PROVIDER_SITE_OTHER): Payer: Self-pay | Admitting: Ophthalmology

## 2011-07-09 ENCOUNTER — Encounter: Payer: Self-pay | Admitting: Ophthalmology

## 2011-07-09 VITALS — BP 121/83 | HR 83 | Temp 97.9°F | Ht 63.0 in | Wt 248.8 lb

## 2011-07-09 DIAGNOSIS — K219 Gastro-esophageal reflux disease without esophagitis: Secondary | ICD-10-CM

## 2011-07-09 DIAGNOSIS — M25552 Pain in left hip: Secondary | ICD-10-CM

## 2011-07-09 DIAGNOSIS — K08409 Partial loss of teeth, unspecified cause, unspecified class: Secondary | ICD-10-CM

## 2011-07-09 DIAGNOSIS — E039 Hypothyroidism, unspecified: Secondary | ICD-10-CM

## 2011-07-09 DIAGNOSIS — Z79899 Other long term (current) drug therapy: Secondary | ICD-10-CM

## 2011-07-09 DIAGNOSIS — I10 Essential (primary) hypertension: Secondary | ICD-10-CM

## 2011-07-09 DIAGNOSIS — F329 Major depressive disorder, single episode, unspecified: Secondary | ICD-10-CM

## 2011-07-09 DIAGNOSIS — F3289 Other specified depressive episodes: Secondary | ICD-10-CM

## 2011-07-09 DIAGNOSIS — I2510 Atherosclerotic heart disease of native coronary artery without angina pectoris: Secondary | ICD-10-CM

## 2011-07-09 DIAGNOSIS — M25559 Pain in unspecified hip: Secondary | ICD-10-CM

## 2011-07-09 DIAGNOSIS — E785 Hyperlipidemia, unspecified: Secondary | ICD-10-CM

## 2011-07-09 DIAGNOSIS — E118 Type 2 diabetes mellitus with unspecified complications: Secondary | ICD-10-CM

## 2011-07-09 LAB — GLUCOSE, CAPILLARY: Glucose-Capillary: 145 mg/dL — ABNORMAL HIGH (ref 70–99)

## 2011-07-09 LAB — POCT GLYCOSYLATED HEMOGLOBIN (HGB A1C): Hemoglobin A1C: 6.7

## 2011-07-09 MED ORDER — LISINOPRIL 5 MG PO TABS: 5.0000 mg | ORAL_TABLET | Freq: Every day | ORAL | Status: DC

## 2011-07-09 MED ORDER — CLOPIDOGREL BISULFATE 75 MG PO TABS: 75.0000 mg | ORAL_TABLET | Freq: Every day | ORAL | Status: DC

## 2011-07-09 MED ORDER — HYDROCODONE-ACETAMINOPHEN 5-500 MG PO TABS: 2.0000 | ORAL_TABLET | Freq: Four times a day (QID) | ORAL | Status: DC | PRN

## 2011-07-09 MED ORDER — SERTRALINE HCL 50 MG PO TABS: 75.0000 mg | ORAL_TABLET | Freq: Every day | ORAL | Status: DC

## 2011-07-09 MED ORDER — OMEPRAZOLE 40 MG PO CPDR: 40.0000 mg | DELAYED_RELEASE_CAPSULE | Freq: Two times a day (BID) | ORAL | Status: DC

## 2011-07-09 MED ORDER — LEVOTHYROXINE SODIUM 150 MCG PO TABS: 150.0000 ug | ORAL_TABLET | Freq: Every day | ORAL | Status: DC

## 2011-07-09 NOTE — Assessment & Plan Note (Signed)
Patient reports being more stressed after having to comfort her god-daughter after her boyfriend's death.

## 2011-07-09 NOTE — Patient Instructions (Signed)
-  Take tylenol regularly, every 6 hours. -heat area for 15-20 minutes - Please do the following stretching routine:  -cross leg stretches  -lower back stretches -avoid laying on your left side -when you sit please sit with right leg out   To perform cross-leg pulls, the affected leg is crossed over the other leg while sitting with the spine in a neutral position either in a chair or on the floor. The knee on the affected side is grasped, and the leg should be pulled to the opposite side. The buttocks should be kept flat, and the patient should not roll the pelvis, as the maximum amount of stretch is obtained when both ischial tuberosities of the buttocks are kept flat on a hard surface. A gentle pulling sensation should be felt in the outer buttocks or hip area. After a brief rest, the maneuver should be repeated 20 times. Cross-leg pulls are followed by low back and sacroiliac stretches, of which a set of each is also initially performed once daily. To enhance the flexibility of the lower lumbosacral spine, knee-chest pulls (first pulling one knee up to the chest followed by the other and finally lying flat and gently pulling up the knees to the chest) and side bends (standing straight and bending to the right then to the left) are each performed in sets of 20 repetitions. The pelvic rock exercise provides increased flexibility to the sacroiliac joint (on all fours the pelvis is gently rotated forward and backwards). Sets of 20 repetitions of the sacroiliac exercise are performed after the lower back stretches. Stretching these areas increases flexibility of the lower spine, the sacroiliac joints, and the hips. Therapeutic ultrasound provides deep heating to the area and can be combined with stretching exercises. Daily stretching exercises may be cut back to three times a week once local symptoms have resolved.

## 2011-07-09 NOTE — Assessment & Plan Note (Addendum)
Patient has complained of this pain for several months, current symptoms are consistent with trochanteric bursitis. Advised patient to take NSAIDs scheduled but she says she is unable to take them since she had a GI bleed when taking ibuprofen. Told her to do heat treatments and 2 sets of stretches and to follow up with [place] in 3 weeks. If she has not improved will refer her to sports medicine doctors who may be able to do an injection.

## 2011-07-09 NOTE — Assessment & Plan Note (Signed)
Well controlled, no changes 

## 2011-07-09 NOTE — Assessment & Plan Note (Signed)
Well controlled, total cholesterol 140s and HDL in mid 30's

## 2011-07-09 NOTE — Assessment & Plan Note (Signed)
A1c is 6.7 today. Well controlled. Patient is trying to avoid candy bars.

## 2011-07-09 NOTE — Progress Notes (Signed)
Subjective:   Patient ID: Kendra Adams female   DOB: Jul 17, 1960 51 y.o.   MRN: [id number]  HPI: Kendra Adams is a 51 y.o. woman who presents for follow up for chronic issues.  DM- sugars seem to be fairly well controlled. Has had hypoglycemic episodes where she feels nauseous and weak and she knows that she has to eat something. Weight is unchanged. She feels like she has lost weight and like her bra size is one size less.  HTN- well controlled currently  Depression- she has been biting heads off. Tammy, husband's cousin lives with them for 6-7 years. She is mentally challenged and Ms. Thoennes is her payee. Tammy's boyfriend died at the end of Apr 13, 2022 so she has had to give much more attention to [facility] since that time.  HLD- well controlled   CAD- patient stopped plavix due to having teeth pulled, wants to know if she can start it on Thursday as dentist said.  Tobacco cessation- patient is on chantix currently. Her goddaughter and her best friend who live in the house both smoke so she feels that she needs it. Seems like she has been on chantix since 2022/04/13.   Back pain- patient fell in yard and hurt back and ever since then she has had pain with leaing forward and is unable to do a sit up.   Hip pain- patient has had lateral hip pain for several months that is irritated by laying on the side. She has a lot of pain when she first stands up that is improved with walking.  Past Medical History  Diagnosis Date  . Carpal tunnel syndrome on both sides     right hand surgery 1998, left hand surgery 1999.  . Allergic rhinitis   . Asthma   . Depression   . GERD (gastroesophageal reflux disease)   . Hyperlipidemia   . Hypothyroidism   . Psoriasis   . CAD (coronary artery disease)     cath 12/13/10: mRCA 99%, EF 65-70%;  s/p BMS to mRCA  . HTN (hypertension)   . DM2 (diabetes mellitus, type 2)   . Hyperlipidemia    Current Outpatient Prescriptions  Medication Sig Dispense Refill  .  albuterol (PROVENTIL HFA;VENTOLIN HFA) 108 (90 BASE) MCG/ACT inhaler Inhale 2 puffs into the lungs every 4 (four) hours as needed. For shortness of breath      . aspirin 325 MG tablet Take 81 mg by mouth daily.      Marland Kitchen atorvastatin (LIPITOR) 80 MG tablet Take 80 mg by mouth at bedtime.      Marland Kitchen levothyroxine (SYNTHROID, LEVOTHROID) 150 MCG tablet Take 150 mcg by mouth daily.      Marland Kitchen lisinopril (PRINIVIL,ZESTRIL) 5 MG tablet Take 5 mg by mouth daily.      . metFORMIN (GLUCOPHAGE) 500 MG tablet Take 500 mg by mouth 2 (two) times daily with a meal.      . metFORMIN (GLUCOPHAGE) 500 MG tablet TAKE 1 TABLET (500 MG TOTAL) BY MOUTH 2 (TWO) TIMES DAILY WITH A MEAL.  60 tablet  4  . metoprolol succinate (TOPROL-XL) 50 MG 24 hr tablet Take 50 mg by mouth daily.      . Omega-3 Fatty Acids (FISH OIL) 1360 MG CAPS Take 1 capsule by mouth daily.       Marland Kitchen omeprazole (PRILOSEC) 40 MG capsule Take 40 mg by mouth 2 (two) times daily.      . sertraline (ZOLOFT) 50 MG tablet Take 75 mg by  mouth daily.      Marland Kitchen DISCONTD: Fluticasone-Salmeterol (ADVAIR DISKUS) 250-50 MCG/DOSE AEPB Inhale 1 puff into the lungs 2 (two) times daily.      Marland Kitchen DISCONTD: sodium chloride (OCEAN NASAL SPRAY) 0.65 % nasal spray 1 spray by Nasal route as needed for congestion.  45 mL  3   Family History  Problem Relation Age of Onset  . Diabetes Mother   . Hypertension Mother   . Diabetes Father   . Hypertension Father    History   Social History  . Marital Status: Married    Spouse Name: N/A    Number of Children: 0  . Years of Education: 10th grade   Occupational History  . Conservation officer, nature     self-employed  . 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 Smoking status: Current Some Day Smoker -- 0.2 packs/day for 35 years    Types: Cigarettes  . Smokeless tobacco: Current User    Types: Chew   Comment: trying to quit/ USES SMOKELESS CIGARETTES.  Cutting back." '"`  . Alcohol Use: No  . Drug Use: No  . Sexually Active:  None   Other Topics Concern  . None   Social History Narrative   Father died of suicide (gunshot) in 05-22-96. 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   Hematological: Denies adenopathy. Easy bruising, personal or family bleeding history  Psychiatric/Behavioral: Denies suicidal ideation, mood changes, confusion, nervousness, sleep disturbance and agitation  Objective:  Physical Exam: Filed Vitals:   07/09/11 1119  BP: 121/83  Pulse: 83  Temp: 97.9 F (36.6 C)  TempSrc: Oral  Height: 5' 3"  (1.6 m)  Weight: 248 lb 12.8 oz (112.855 kg)  SpO2: 97%   General: talkative pleasant woman sitting in chair in no distress HEENT: PERRL, EOMI, no scleral icterus, bruises present on bilateral chin with raw gums where teeth have been extracted Hip: patient has point tenderness over greater trochanter. No ASIS tenderness or SI tenderness. Gait: patient has uneven gait and seems to walk tilting to the side. Neuro: alert and oriented X3, cranial nerves II-XII grossly intact  Assessment & Plan:

## 2011-08-05 ENCOUNTER — Ambulatory Visit (INDEPENDENT_AMBULATORY_CARE_PROVIDER_SITE_OTHER): Payer: Self-pay | Admitting: Ophthalmology

## 2011-08-05 ENCOUNTER — Encounter: Payer: Self-pay | Admitting: Ophthalmology

## 2011-08-05 VITALS — BP 113/83 | HR 80 | Temp 97.7°F | Ht 63.0 in | Wt 242.8 lb

## 2011-08-05 DIAGNOSIS — M545 Low back pain, unspecified: Secondary | ICD-10-CM

## 2011-08-05 DIAGNOSIS — R8290 Unspecified abnormal findings in urine: Secondary | ICD-10-CM

## 2011-08-05 DIAGNOSIS — E118 Type 2 diabetes mellitus with unspecified complications: Secondary | ICD-10-CM

## 2011-08-05 DIAGNOSIS — K08409 Partial loss of teeth, unspecified cause, unspecified class: Secondary | ICD-10-CM

## 2011-08-05 DIAGNOSIS — N3941 Urge incontinence: Secondary | ICD-10-CM

## 2011-08-05 LAB — POCT URINALYSIS DIPSTICK
Bilirubin, UA: NEGATIVE
Blood, UA: NEGATIVE
Glucose, UA: NEGATIVE
Ketones, UA: NEGATIVE
Leukocytes, UA: NEGATIVE
Nitrite, UA: NEGATIVE
Spec Grav, UA: >=1.03
Urobilinogen, UA: 0.2
pH, UA: 6

## 2011-08-05 LAB — GLUCOSE, CAPILLARY: Glucose-Capillary: 118 mg/dL — ABNORMAL HIGH (ref 70–99)

## 2011-08-05 MED ORDER — HYDROCODONE-ACETAMINOPHEN 5-500 MG PO TABS: 1.0000 | ORAL_TABLET | Freq: Four times a day (QID) | ORAL | Status: DC | PRN

## 2011-08-05 NOTE — Progress Notes (Signed)
Subjective:   Patient ID: Kendra Adams female   DOB: October 17, 1960 51 y.o.   MRN: [id number]  HPI: Kendra Adams is a 51 y.o. woman here for back pain. Started 4 days ago. Had fall but was about 10 days ago. Is having trouble with bending down due to pain. She initially had right flank pain but now pain is mostly central lumbar. She fell in '09 but didn't have any imaging. She says she was in an unusual sexual position about 5 days ago. Hurts back when she coughs. Denies vaginal discharge or dysuria. Noticed strong urine smell. No fevers or chills. No history of kidney stones. No blood in urine. Wears a pad when she goes out since she has some incontinence. Not changed. Has urge incontinence. She sometimes doesn't make it to bathroom without urinating. Been taking tylenol since she has had GI bleeding in the past.  Made a splint for her thumb when she fell last week.  Now stopped using the splint.  Past Medical History  Diagnosis Date  . Carpal tunnel syndrome on both sides     right hand surgery 1998, left hand surgery 1999.  . Allergic rhinitis   . Asthma   . Depression   . GERD (gastroesophageal reflux disease)   . Hyperlipidemia   . Hypothyroidism   . Psoriasis   . CAD (coronary artery disease)     cath 12/13/10: mRCA 99%, EF 65-70%;  s/p BMS to mRCA  . HTN (hypertension)   . DM2 (diabetes mellitus, type 2)   . Hyperlipidemia    Current Outpatient Prescriptions  Medication Sig Dispense Refill  . aspirin 325 MG tablet Take 81 mg by mouth daily.      Marland Kitchen atorvastatin (LIPITOR) 80 MG tablet Take 80 mg by mouth at bedtime.      . clopidogrel (PLAVIX) 75 MG tablet Take 1 tablet (75 mg total) by mouth daily.  90 tablet  4  . HYDROcodone-acetaminophen (VICODIN) 5-500 MG per tablet Take 2 tablets by mouth every 6 (six) hours as needed for pain.  10 tablet  0  . levothyroxine (SYNTHROID, LEVOTHROID) 150 MCG tablet Take 1 tablet (150 mcg total) by mouth daily.  90 tablet  4  . lisinopril  (PRINIVIL,ZESTRIL) 5 MG tablet Take 1 tablet (5 mg total) by mouth daily.  90 tablet  4  . metFORMIN (GLUCOPHAGE) 500 MG tablet TAKE 1 TABLET (500 MG TOTAL) BY MOUTH 2 (TWO) TIMES DAILY WITH A MEAL.  60 tablet  4  . metoprolol succinate (TOPROL-XL) 50 MG 24 hr tablet Take 50 mg by mouth daily.      . Omega-3 Fatty Acids (FISH OIL) 1360 MG CAPS Take 1 capsule by mouth daily.       Marland Kitchen omeprazole (PRILOSEC) 40 MG capsule Take 1 capsule (40 mg total) by mouth 2 (two) times daily.  90 capsule  4  . sertraline (ZOLOFT) 50 MG tablet Take 1.5 tablets (75 mg total) by mouth daily.  90 tablet  4  . DISCONTD: Fluticasone-Salmeterol (ADVAIR DISKUS) 250-50 MCG/DOSE AEPB Inhale 1 puff into the lungs 2 (two) times daily.      Marland Kitchen DISCONTD: sodium chloride (OCEAN NASAL SPRAY) 0.65 % nasal spray 1 spray by Nasal route as needed for congestion.  45 mL  3   Family History  Problem Relation Age of Onset  . Diabetes Mother   . Hypertension Mother   . Diabetes Father   . Hypertension Father    History  Social History  . Marital Status: Married    Spouse Name: N/A    Number of Children: 0  . Years of Education: 10th grade   Occupational History  . Conservation officer, nature     self-employed  . 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 Smoking status: Current Some Day Smoker -- 0.2 packs/day for 35 years    Types: Cigarettes  . Smokeless tobacco: Current User    Types: Chew   Comment: trying to quit/ USES SMOKELESS CIGARETTES.  Cutting back." '"`  . Alcohol Use: No  . Drug Use: No  . Sexually Active: None   Other Topics Concern  . None   Social History Narrative   Father died of suicide (gunshot) in 05/18/96. 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 Patient lives with her husband, her husband's niece Tammy who is mentally retarded and her best friend and the friend's daughter and 6 dogs    Objective:    Physical Exam: Filed Vitals:   08/05/11 1503  BP: 141/91  Pulse: 80  Temp: 97.7 F (36.5 C)  TempSrc: Oral  Height: 5' 3"  (1.6 m)  Weight: 242 lb 12.8 oz (110.133 kg)  SpO2: 95%   General: obese pleasant woman sitting in chair HEENT: PERRL, EOMI, no scleral icterus Back: pain is elicited in right paraspinal area with right hip flexion, and back flexion, palpation of lumbar spine and right paraspinal area. Ext: warm and well perfused, no pedal edema Neuro: alert and oriented X3, cranial nerves II-XII grossly intact, sensation and strength full in bilateral lower extremities  Assessment & Plan:

## 2011-08-05 NOTE — Patient Instructions (Addendum)
-  Heat pads for 20 minutes 1-2 times a day -Massage area for 10 minutes daily -Do cat stretch and child's pose stretch for 20 minutes every day - Call us back if you are not improving in a week

## 2011-08-05 NOTE — Assessment & Plan Note (Signed)
Did not address this issue on this visit, please discuss possibility of anticholinergic agents vs. Estrogen topical therapy with patient based on severity of symptoms.

## 2011-08-05 NOTE — Assessment & Plan Note (Signed)
Is due for pneumovax, ophthalmology exam. Please address at next visit.

## 2011-08-05 NOTE — Assessment & Plan Note (Signed)
Advised patient to try conservative treatment with vicodin, heating pads, stretching and massage. Demonstrated two lower back stretches for her. Asked to call back if pain not improved in 1 week.

## 2011-08-26 ENCOUNTER — Encounter: Payer: Self-pay | Admitting: Internal Medicine

## 2011-08-26 ENCOUNTER — Ambulatory Visit (INDEPENDENT_AMBULATORY_CARE_PROVIDER_SITE_OTHER): Payer: Self-pay | Admitting: Internal Medicine

## 2011-08-26 ENCOUNTER — Telehealth: Payer: Self-pay | Admitting: Internal Medicine

## 2011-08-26 ENCOUNTER — Ambulatory Visit (HOSPITAL_COMMUNITY)
Admission: RE | Admit: 2011-08-26 | Discharge: 2011-08-26 | Disposition: A | Discharge status: Home / Self Care | Payer: Self-pay | Source: Ambulatory Visit | Attending: Internal Medicine | Admitting: Internal Medicine

## 2011-08-26 VITALS — BP 109/78 | HR 73 | Temp 97.1°F | Ht 63.0 in | Wt 244.9 lb

## 2011-08-26 DIAGNOSIS — S6980XA Other specified injuries of unspecified wrist, hand and finger(s), initial encounter: Secondary | ICD-10-CM | POA: Insufficient documentation

## 2011-08-26 DIAGNOSIS — S6991XA Unspecified injury of right wrist, hand and finger(s), initial encounter: Secondary | ICD-10-CM

## 2011-08-26 DIAGNOSIS — S6990XA Unspecified injury of unspecified wrist, hand and finger(s), initial encounter: Secondary | ICD-10-CM

## 2011-08-26 DIAGNOSIS — E039 Hypothyroidism, unspecified: Secondary | ICD-10-CM

## 2011-08-26 DIAGNOSIS — X58XXXA Exposure to other specified factors, initial encounter: Secondary | ICD-10-CM

## 2011-08-26 MED ORDER — ACETAMINOPHEN 325 MG PO TABS: 650.0000 mg | ORAL_TABLET | ORAL | Status: DC | PRN

## 2011-08-26 MED ORDER — IBUPROFEN 200 MG PO TABS: 400.0000 mg | ORAL_TABLET | Freq: Three times a day (TID) | ORAL | Status: DC | PRN

## 2011-08-26 NOTE — Telephone Encounter (Signed)
   Patient hand x-ray shows No acute fracture. Degenerative change particularly involving the articulation of the base of the first metacarpal with the trapezium.  I called the patient and I told her that she does not have any bone fracture in her right thumb. She is happy about it  Ivor Costa, MD PGY1, Internal Medicine Teaching Service Pager: [phone]

## 2011-08-26 NOTE — Patient Instructions (Addendum)
1. Please get X-ray of your right hand to rule out any bony fracture. We will call you for the results. I will give you tylenol prescription for your pain. 2. Please take all medications as prescribed.  3. If you have worsening of your symptoms or new symptoms arise, please call the clinic ([phone]), or go to the ER immediately if symptoms are severe.  You have done great job in taking all your medications. I appreciate it very much. Please continue doing that.

## 2011-08-26 NOTE — Assessment & Plan Note (Signed)
Right thumb injury: After injury, patient has been having pain and mild swelling in her right thumb for more than 4 weeks. It is concerning for fracture of bone. Will get x-ray of right hand. Will treat accordingly depending on the x-ray result. Patient reports that she cannot take ibuprofen because of past GI bleeding. Will give her Tylenol prescription for her pain.

## 2011-08-26 NOTE — Progress Notes (Signed)
Patient ID: Kendra Adams, female   DOB: April 27, 1960, 51 y.o.   MRN: [id number]  Subjective:   Patient ID: Kendra Adams female   DOB: [birth year] 51 y.o.   MRN: [id number]  HPI: Kendra Adams is a 51 y.o. with a past medical history of hypothyroidism, diabetes, CAD, hyperlipidemia, asthma, depression, GERD, psoriasis and hypertension, who presents for an acute visit because of right thumb pain.  Per patient, proximately 4-5 weeks ago when she was trimming bush, she tripped her step and fell on the ground and injured her right thumb and index finger. She started having pain and swelling in her thumb and index finger after that fall. Her index finger pain recovered. But the thumb pain persists until now. She described the pain is 10 out of 10 in severity in her PIP and MCP joints, and also in her proximal phalanx of the thumb. This thumb pain interferes with her routine activities. She cannot use it normally without pain. Her sensation in right thumb is normal.   Regarding her hypothyroidism, she does not have cold feeling all the time, no dry skin or dry hairs. She is not constipated. She has been taking her Synthroid regularly.   Past Medical History  Diagnosis Date  . Carpal tunnel syndrome on both sides     right hand surgery 1998, left hand surgery 1999.  . Allergic rhinitis   . Asthma   . Depression   . GERD (gastroesophageal reflux disease)   . Hyperlipidemia   . Hypothyroidism   . Psoriasis   . CAD (coronary artery disease)     cath 12/13/10: mRCA 99%, EF 65-70%;  s/p BMS to mRCA  . HTN (hypertension)   . DM2 (diabetes mellitus, type 2)   . Hyperlipidemia    Current Outpatient Prescriptions  Medication Sig Dispense Refill  . aspirin 325 MG tablet Take 81 mg by mouth daily.      Marland Kitchen atorvastatin (LIPITOR) 80 MG tablet Take 80 mg by mouth at bedtime.      . clopidogrel (PLAVIX) 75 MG tablet Take 1 tablet (75 mg total) by mouth daily.  90 tablet  4  . HYDROcodone-acetaminophen  (VICODIN) 5-500 MG per tablet Take 1 tablet by mouth every 6 (six) hours as needed for pain.  18 tablet  0  . levothyroxine (SYNTHROID, LEVOTHROID) 150 MCG tablet Take 1 tablet (150 mcg total) by mouth daily.  90 tablet  4  . lisinopril (PRINIVIL,ZESTRIL) 5 MG tablet Take 1 tablet (5 mg total) by mouth daily.  90 tablet  4  . metFORMIN (GLUCOPHAGE) 500 MG tablet TAKE 1 TABLET (500 MG TOTAL) BY MOUTH 2 (TWO) TIMES DAILY WITH A MEAL.  60 tablet  4  . metoprolol succinate (TOPROL-XL) 50 MG 24 hr tablet Take 50 mg by mouth daily.      . Omega-3 Fatty Acids (FISH OIL) 1360 MG CAPS Take 1 capsule by mouth daily.       . sertraline (ZOLOFT) 50 MG tablet Take 1.5 tablets (75 mg total) by mouth daily.  90 tablet  4  . omeprazole (PRILOSEC) 40 MG capsule Take 1 capsule (40 mg total) by mouth 2 (two) times daily.  90 capsule  4  . DISCONTD: Fluticasone-Salmeterol (ADVAIR DISKUS) 250-50 MCG/DOSE AEPB Inhale 1 puff into the lungs 2 (two) times daily.      Marland Kitchen DISCONTD: sodium chloride (OCEAN NASAL SPRAY) 0.65 % nasal spray 1 spray by Nasal route as needed for congestion.  45 mL  3   Family History  Problem Relation Age of Onset  . Diabetes Mother   . Hypertension Mother   . Diabetes Father   . Hypertension Father    History   Social History  . Marital Status: Married    Spouse Name: N/A    Number of Children: 0  . Years of Education: 10th grade   Occupational History  . Conservation officer, nature     self-employed  . 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 Smoking status: Current Some Day Smoker -- 0.2 packs/day for 35 years    Types: Cigarettes  . Smokeless tobacco: Current User    Types: Chew   Comment: trying to quit/ USES SMOKELESS CIGARETTES.  Cutting back." '"`  . Alcohol Use: No  . Drug Use: No  . Sexually Active: None   Other Topics Concern  . None   Social History Narrative   Father died of suicide (gunshot) in 1996-05-28. 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 Patient lives with her husband, her husband's niece Tammy who is mentally retarded and her best friend and the friend's daughter and 6 dogs   Review of Systems: Constitutional: Denies fever, chills, diaphoresis, appetite change and fatigue.  HEENT: Denies photophobia, eye pain, redness, hearing loss, ear pain, congestion, sore throat, rhinorrhea, sneezing, mouth sores, trouble swallowing, neck pain, neck stiffness and tinnitus.   Respiratory: Denies SOB, DOE, cough, chest tightness,  and wheezing.   Cardiovascular: Denies chest pain, palpitations and leg swelling.  Gastrointestinal: Denies nausea, vomiting, abdominal pain, diarrhea, constipation, blood in stool and abdominal distention.  Genitourinary: Denies dysuria, urgency, frequency, hematuria, flank pain and difficulty urinating.  Musculoskeletal: pain in right thumb  Skin: Denies pallor, rash and wound.  Neurological: Denies dizziness, seizures, syncope, weakness, light-headedness, numbness and headaches.  Hematological: Denies adenopathy. Easy bruising, personal or family bleeding history  Psychiatric/Behavioral: Denies suicidal ideation, mood changes, confusion, nervousness, sleep disturbance and agitation  Objective:  Physical Exam: Filed Vitals:   08/26/11 1535  BP: 109/78  Pulse: 73  Temp: 97.1 F (36.2 C)  TempSrc: Oral  Height: 5' 3"  (1.6 m)  Weight: 244 lb 14.4 oz (111.086 kg)  SpO2: 100%   General: resting in bed, not in acute distress HEENT: PERRL, EOMI, no scleral icterus Cardiac: S1/S2, RRR, No murmurs, gallops or rubs Pulm: Good air movement bilaterally, Clear to auscultation bilaterally, No rales, wheezing, rhonchi or rubs. Abd: Soft,  nondistended, nontender, no rebound pain, no organomegaly, BS present Ext: No rashes or edema, 2+DP/PT pulse bilaterally Musculoskeletal: There is mild swelling in the PIP joint of right thumb. There is  tenderness over PIP and MCP joints, and also over proximal phalanx of the right thumb. There is no decreased sensation. Skin: no rashes. No skin bruise. Neuro: alert and oriented X3, cranial nerves II-XII grossly intact, muscle strength 5/5 in all extremeties,  sensation to light touch intact.  Psych.: patient is not psychotic, no suicidal or hemocidal ideation.   Assessment & Plan:   #. Right thumb injury: After injury, patient has been having pain and mild swelling in her right thumb for more than 4 weeks. It is concerning for fracture of bone. Will get x-ray of right hand. Will treat accordingly depending on the x-ray result. Patient reports that she cannot take ibuprofen because of past GI bleeding.  Will give her Tylenol prescription for her pain.   #. Hypothyroidism: She reports that she has been taking her Synthroid regularly as prescribed. Patient doesn't have symptoms for hypothyroidism, such as feeling cold all the time, dry skin, dry hair and constipation. Will not change management today.  Ivor Costa, MD PGY1, Internal Medicine Teaching Service Pager: [phone]

## 2011-09-02 ENCOUNTER — Encounter: Payer: Self-pay | Admitting: Internal Medicine

## 2011-09-03 ENCOUNTER — Emergency Department (HOSPITAL_COMMUNITY)
Admission: EM | Admit: 2011-09-03 | Discharge: 2011-09-03 | Disposition: A | Discharge status: Home / Self Care | Payer: Self-pay | Attending: Emergency Medicine | Admitting: Emergency Medicine

## 2011-09-03 DIAGNOSIS — W57XXXA Bitten or stung by nonvenomous insect and other nonvenomous arthropods, initial encounter: Secondary | ICD-10-CM | POA: Insufficient documentation

## 2011-09-03 DIAGNOSIS — Z7982 Long term (current) use of aspirin: Secondary | ICD-10-CM | POA: Insufficient documentation

## 2011-09-03 DIAGNOSIS — Z1889 Other specified retained foreign body fragments: Secondary | ICD-10-CM | POA: Insufficient documentation

## 2011-09-03 DIAGNOSIS — F3289 Other specified depressive episodes: Secondary | ICD-10-CM | POA: Insufficient documentation

## 2011-09-03 DIAGNOSIS — E119 Type 2 diabetes mellitus without complications: Secondary | ICD-10-CM | POA: Insufficient documentation

## 2011-09-03 DIAGNOSIS — E039 Hypothyroidism, unspecified: Secondary | ICD-10-CM | POA: Insufficient documentation

## 2011-09-03 DIAGNOSIS — K219 Gastro-esophageal reflux disease without esophagitis: Secondary | ICD-10-CM | POA: Insufficient documentation

## 2011-09-03 DIAGNOSIS — F17200 Nicotine dependence, unspecified, uncomplicated: Secondary | ICD-10-CM | POA: Insufficient documentation

## 2011-09-03 DIAGNOSIS — E785 Hyperlipidemia, unspecified: Secondary | ICD-10-CM | POA: Insufficient documentation

## 2011-09-03 DIAGNOSIS — I10 Essential (primary) hypertension: Secondary | ICD-10-CM | POA: Insufficient documentation

## 2011-09-03 DIAGNOSIS — F329 Major depressive disorder, single episode, unspecified: Secondary | ICD-10-CM | POA: Insufficient documentation

## 2011-09-03 DIAGNOSIS — J45909 Unspecified asthma, uncomplicated: Secondary | ICD-10-CM | POA: Insufficient documentation

## 2011-09-03 DIAGNOSIS — Z886 Allergy status to analgesic agent status: Secondary | ICD-10-CM | POA: Insufficient documentation

## 2011-09-03 DIAGNOSIS — M795 Residual foreign body in soft tissue: Secondary | ICD-10-CM | POA: Insufficient documentation

## 2011-09-03 DIAGNOSIS — Z79899 Other long term (current) drug therapy: Secondary | ICD-10-CM | POA: Insufficient documentation

## 2011-09-03 DIAGNOSIS — S30860A Insect bite (nonvenomous) of lower back and pelvis, initial encounter: Secondary | ICD-10-CM | POA: Insufficient documentation

## 2011-09-03 MED ORDER — DOXYCYCLINE HYCLATE 100 MG PO CAPS: 100.0000 mg | ORAL_CAPSULE | Freq: Two times a day (BID) | ORAL | Status: AC

## 2011-09-03 NOTE — ED Notes (Signed)
Patient states she works outside every day and found a tick on her this evening.

## 2011-09-03 NOTE — Discharge Instructions (Signed)
use tick repellent when outside and especially when mowing lawns. be evaluated immediately for any fever, rash or any concerning symptoms. Take antibiotics as prescribed.  Wood Tick Bite  Ticks are insects that attach themselves to the skin. Most tick bites are harmless, but sometimes ticks carry diseases that can make a person quite ill. The chance of getting ill depends on:  The kind of tick that bites you.  Time of year.  How long the tick is attached.  Geographic location.  Wood ticks are also called dog ticks. They are generally black. They can have white markings. They live in shrubs and grassy areas. They are larger than deer ticks. Wood ticks are about the size of a watermelon seed. They have a hard body.  The most common places for ticks to attach themselves are the scalp, neck, armpits, waist, and groin. Wood ticks may stay attached for up to 2 weeks.  TICKS MUST BE REMOVED AS SOON AS POSSIBLE TO HELP PREVENT DISEASES CAUSED BY TICK BITES.  TO REMOVE A TICK:  1. If available, put on latex gloves before trying to remove a tick.  2. Grasp the tick as close to the skin as possible, with curved forceps, fine tweezers or a special tick removal tool.  3. Pull gently with steady pressure until the tick lets go. Do not twist the tick or jerk it suddenly. This may break off the tick's head or mouth parts.  4. Do not crush the tick's body. This could force disease-carrying fluids from the tick into your body.  5. After the tick is removed, wash the bite area and your hands with soap and water or other disinfectant.  6. Apply a small amount of antiseptic cream or ointment to the bite site.  7. Wash and disinfect any instruments that were used.  8. Save the tick in a jar or plastic bag for later identification. Preserve the tick with a bit of alcohol or put it in the freezer.  9. Do not apply a hot match, petroleum jelly, or fingernail polish to the tick. This does not work and may increase the  chances of disease from the tick bite.  YOU MAY NEED TO SEE YOUR CAREGIVER FOR A TETANUS SHOT NOW IF:  You have no idea when you had the last one.  You have never had a tetanus shot before.  If you need a tetanus shot, and you decide not to get one, there is a rare chance of getting tetanus. Sickness from tetanus can be serious.  If you get a tetanus shot, your arm may swell, get red and warm to the touch at the shot site. This is common and not a problem.  PREVENTION  Wear protective clothing. Long sleeves and pants are best.  Wear white clothes to see ticks more easily  Tuck your pant legs into your socks.  If walking on trail, stay in the middle of the trail to avoid brushing against bushes.  Put insect repellent on all exposed skin and along boot tops, pant legs and sleeve cuffs  Check clothing, hair and skin repeatedly and before coming inside.  Brush off any ticks that are not attached.  SEEK MEDICAL CARE IF:  You cannot remove a tick or part of the tick that is left in the skin.  Unexplained fever.  Redness and swelling in the area of the tick bite.  Tender, swollen lymph glands.  Diarrhea.  Weight loss.  Cough.  Fatigue.  Muscle, joint  or bone pain.  Belly pain.  Headache.  Rash.  SEEK IMMEDIATE MEDICAL CARE IF:  You develop an oral temperature above 102 F (38.9 C).  You are having trouble walking or moving your legs.  Numbness in the legs.  Shortness of breath.  Confusion.  Repeated vomiting.

## 2011-09-03 NOTE — ED Provider Notes (Signed)
History     CSN: [id number]  Arrival date & time 09/03/11  7628   First MD Initiated Contact with Patient 09/03/11 [phone]      Chief Complaint  Patient presents with  . Insect Bite    (Consider location/radiation/quality/duration/timing/severity/associated sxs/prior treatment) HPI History provided by patient. Found a tick on her left flank tonight. Had been itching for a while and patient was not able to see it. At a family member look to try to pull it out but only got half of the tick. No fevers or rash. Uncertain how long it had been there. Patient mows lawns and works outside without any other known tick bites. Moderate in severity. No history of Lyme disease or known tick borne illness. Past Medical History  Diagnosis Date  . Carpal tunnel syndrome on both sides     right hand surgery 1998, left hand surgery 1999.  . Allergic rhinitis   . Asthma   . Depression   . GERD (gastroesophageal reflux disease)   . Hyperlipidemia   . Hypothyroidism   . Psoriasis   . CAD (coronary artery disease)     cath 12/13/10: mRCA 99%, EF 65-70%;  s/p BMS to mRCA  . HTN (hypertension)   . DM2 (diabetes mellitus, type 2)   . Hyperlipidemia     Past Surgical History  Procedure Date  . Coronary angioplasty with stent placement   . Carpal tunnel release     w/ bone spurs  . Dilation and curettage of uterus     Family History  Problem Relation Age of Onset  . Diabetes Mother   . Hypertension Mother   . Diabetes Father   . Hypertension Father     History  Substance Use Topics  . Smoking status: Current Some Day Smoker -- 0.2 packs/day for 35 years    Types: Cigarettes  . Smokeless tobacco: Current User    Types: Chew   Comment: trying to quit/ USES SMOKELESS CIGARETTES.  Cutting back." '"`  . Alcohol Use: No    OB History    Grav Para Term Preterm Abortions TAB SAB Ect Mult Living                  Review of Systems  Constitutional: Negative for fever and chills.  HENT:  Negative for neck pain and neck stiffness.   Eyes: Negative for pain.  Respiratory: Negative for shortness of breath.   Cardiovascular: Negative for chest pain.  Gastrointestinal: Negative for abdominal pain.  Genitourinary: Negative for dysuria.  Musculoskeletal: Negative for back pain.  Skin: Positive for wound. Negative for rash.  Neurological: Negative for headaches.  All other systems reviewed and are negative.    Allergies  Tramadol hcl  Home Medications   Current Outpatient Rx  Name Route Sig Dispense Refill  . ACETAMINOPHEN 325 MG PO TABS Oral Take 2 tablets (650 mg total) by mouth every 4 (four) hours as needed for pain. 50 tablet 2  . ALBUTEROL SULFATE HFA 108 (90 BASE) MCG/ACT IN AERS Inhalation Inhale 2 puffs into the lungs every 6 (six) hours as needed. For breathing    . ASPIRIN 81 MG PO CHEW Oral Chew 81 mg by mouth daily.    . ATORVASTATIN CALCIUM 80 MG PO TABS Oral Take 80 mg by mouth at bedtime.    . CLOPIDOGREL BISULFATE 75 MG PO TABS Oral Take 1 tablet (75 mg total) by mouth daily. 90 tablet 4  . FLUTICASONE-SALMETEROL 250-50 MCG/DOSE IN AEPB Inhalation  Inhale 1 puff into the lungs every 12 (twelve) hours.    Marland Kitchen LEVOTHYROXINE SODIUM 150 MCG PO TABS Oral Take 1 tablet (150 mcg total) by mouth daily. 90 tablet 4  . LISINOPRIL 5 MG PO TABS Oral Take 1 tablet (5 mg total) by mouth daily. 90 tablet 4  . METFORMIN HCL 500 MG PO TABS  TAKE 1 TABLET (500 MG TOTAL) BY MOUTH 2 (TWO) TIMES DAILY WITH A MEAL. 60 tablet 4    No refills available  . METOPROLOL SUCCINATE ER 50 MG PO TB24 Oral Take 50 mg by mouth daily.    Marland Kitchen FISH OIL 1360 MG PO CAPS Oral Take 1 capsule by mouth daily.     Marland Kitchen OMEPRAZOLE 40 MG PO CPDR Oral Take 1 capsule (40 mg total) by mouth 2 (two) times daily. 90 capsule 4  . SERTRALINE HCL 50 MG PO TABS Oral Take 1.5 tablets (75 mg total) by mouth daily. 90 tablet 4  . VARENICLINE TARTRATE 1 MG PO TABS Oral Take 1 mg by mouth 2 (two) times daily.      BP  140/75  Temp 97.4 F (36.3 C) (Oral)  Resp 18  SpO2 96%  Physical Exam  Constitutional: She is oriented to person, place, and time. She appears well-developed and well-nourished.  HENT:  Head: Normocephalic and atraumatic.  Eyes: Conjunctivae and EOM are normal. Pupils are equal, round, and reactive to light.  Neck: Trachea normal. Neck supple. No thyromegaly present.  Cardiovascular: Normal rate, regular rhythm, S1 normal, S2 normal and normal pulses.     No systolic murmur is present   No diastolic murmur is present  Pulses:      Radial pulses are 2+ on the right side, and 2+ on the left side.  Pulmonary/Chest: Effort normal and breath sounds normal. She has no wheezes. She has no rhonchi. She has no rales. She exhibits no tenderness.  Abdominal: Soft. Normal appearance and bowel sounds are normal. There is no tenderness. There is no CVA tenderness and negative Murphy's sign.  Musculoskeletal:       Small area of erythema to left posterior flank with tick embedded in the skin. No rash otherwise. No tenderness. No swelling.  Neurological: She is alert and oriented to person, place, and time. She has normal strength. No cranial nerve deficit or sensory deficit. GCS eye subscore is 4. GCS verbal subscore is 5. GCS motor subscore is 6.  Skin: Skin is warm and dry. No rash noted. She is not diaphoretic.  Psychiatric: Her speech is normal.       Cooperative and appropriate    ED Course  FOREIGN BODY REMOVAL Date/Time: 09/03/2011 4:22 AM Performed by: Teressa Lower Authorized by: Teressa Lower Consent: Verbal consent obtained. Risks and benefits: risks, benefits and alternatives were discussed Consent given by: patient Patient understanding: patient states understanding of the procedure being performed Patient consent: the patient's understanding of the procedure matches consent given Procedure consent: procedure consent matches procedure scheduled Required items: required blood  products, implants, devices, and special equipment available Patient identity confirmed: verbally with patient Time out: Immediately prior to procedure a "time out" was called to verify the correct patient, procedure, equipment, support staff and site/side marked as required. Body area: skin General location: trunk Location details: back Patient cooperative: yes Removal mechanism: forceps Dressing: antibiotic ointment Depth: subcutaneous Complexity: simple 1 objects recovered. Objects recovered: 1 Post-procedure assessment: foreign body removed Patient tolerance: Patient tolerated the procedure well with no immediate complications.  Comments: Alcohol prep and tick removed. No residual tick parts identified. No bleeding.   (including critical care time)    MDM   Residual tick part removed as above. Given uncertain timeframe/ exposure, prescription for doxycycline provided. Reliable historian verbalizes understanding all discharge and followup instructions.       Teressa Lower, MD 09/03/11 [phone]

## 2011-09-15 ENCOUNTER — Telehealth: Payer: Self-pay | Admitting: *Deleted

## 2011-09-15 NOTE — Telephone Encounter (Signed)
Pt called was treated recently for tick bite in ER with antibiotics. Now has fungus vag infection. Uses [facility]. Hilda Blades Jams Trickett RN 09/15/11 5:30PM

## 2011-09-15 NOTE — Telephone Encounter (Signed)
I would advise her to try over-the-counter Monistat vaginal cream with dosing as directed on the product.  If her problems persist or worsen, she should be seen in [facility] and examined.

## 2011-09-25 ENCOUNTER — Encounter (HOSPITAL_COMMUNITY): Payer: Self-pay | Admitting: *Deleted

## 2011-09-25 ENCOUNTER — Emergency Department (HOSPITAL_COMMUNITY): Payer: Self-pay

## 2011-09-25 ENCOUNTER — Observation Stay (HOSPITAL_COMMUNITY)
Admission: EM | Admit: 2011-09-25 | Discharge: 2011-09-25 | Disposition: A | Discharge status: Home / Self Care | Payer: Self-pay | Attending: Internal Medicine | Admitting: Internal Medicine

## 2011-09-25 DIAGNOSIS — X500XXA Overexertion from strenuous movement or load, initial encounter: Secondary | ICD-10-CM | POA: Insufficient documentation

## 2011-09-25 DIAGNOSIS — E669 Obesity, unspecified: Secondary | ICD-10-CM

## 2011-09-25 DIAGNOSIS — B373 Candidiasis of vulva and vagina: Secondary | ICD-10-CM | POA: Diagnosis present

## 2011-09-25 DIAGNOSIS — F329 Major depressive disorder, single episode, unspecified: Secondary | ICD-10-CM

## 2011-09-25 DIAGNOSIS — F3289 Other specified depressive episodes: Secondary | ICD-10-CM

## 2011-09-25 DIAGNOSIS — E118 Type 2 diabetes mellitus with unspecified complications: Secondary | ICD-10-CM

## 2011-09-25 DIAGNOSIS — E039 Hypothyroidism, unspecified: Secondary | ICD-10-CM

## 2011-09-25 DIAGNOSIS — R079 Chest pain, unspecified: Secondary | ICD-10-CM

## 2011-09-25 DIAGNOSIS — S6991XA Unspecified injury of right wrist, hand and finger(s), initial encounter: Secondary | ICD-10-CM

## 2011-09-25 DIAGNOSIS — E785 Hyperlipidemia, unspecified: Secondary | ICD-10-CM | POA: Insufficient documentation

## 2011-09-25 DIAGNOSIS — I2510 Atherosclerotic heart disease of native coronary artery without angina pectoris: Secondary | ICD-10-CM

## 2011-09-25 DIAGNOSIS — B3731 Acute candidiasis of vulva and vagina: Secondary | ICD-10-CM | POA: Diagnosis present

## 2011-09-25 DIAGNOSIS — Z9861 Coronary angioplasty status: Secondary | ICD-10-CM | POA: Insufficient documentation

## 2011-09-25 DIAGNOSIS — IMO0002 Reserved for concepts with insufficient information to code with codable children: Principal | ICD-10-CM | POA: Insufficient documentation

## 2011-09-25 DIAGNOSIS — S29011A Strain of muscle and tendon of front wall of thorax, initial encounter: Secondary | ICD-10-CM

## 2011-09-25 DIAGNOSIS — I152 Hypertension secondary to endocrine disorders: Secondary | ICD-10-CM | POA: Diagnosis present

## 2011-09-25 DIAGNOSIS — J45909 Unspecified asthma, uncomplicated: Secondary | ICD-10-CM | POA: Insufficient documentation

## 2011-09-25 DIAGNOSIS — I10 Essential (primary) hypertension: Secondary | ICD-10-CM

## 2011-09-25 DIAGNOSIS — Y92009 Unspecified place in unspecified non-institutional (private) residence as the place of occurrence of the external cause: Secondary | ICD-10-CM | POA: Insufficient documentation

## 2011-09-25 DIAGNOSIS — E1159 Type 2 diabetes mellitus with other circulatory complications: Secondary | ICD-10-CM | POA: Diagnosis present

## 2011-09-25 LAB — POCT I-STAT, CHEM 8
BUN: 15 mg/dL (ref 6–23)
Calcium, Ion: 1.26 mmol/L — ABNORMAL HIGH (ref 1.12–1.23)
Chloride: 104 mEq/L (ref 96–112)
Creatinine, Ser: 0.8 mg/dL (ref 0.50–1.10)
Glucose, Bld: 122 mg/dL — ABNORMAL HIGH (ref 70–99)
HCT: 39 % (ref 36.0–46.0)
Hemoglobin: 13.3 g/dL (ref 12.0–15.0)
Potassium: 3.6 mEq/L (ref 3.5–5.1)
Sodium: 142 mEq/L (ref 135–145)
TCO2: 24 mmol/L (ref 0–100)

## 2011-09-25 LAB — HEMOGLOBIN A1C
Hgb A1c MFr Bld: 6.6 % — ABNORMAL HIGH (ref <5.7)
Mean Plasma Glucose: 143 mg/dL — ABNORMAL HIGH (ref <117)

## 2011-09-25 LAB — GLUCOSE, CAPILLARY
Glucose-Capillary: 159 mg/dL — ABNORMAL HIGH (ref 70–99)
Glucose-Capillary: 149 mg/dL — ABNORMAL HIGH (ref 70–99)

## 2011-09-25 LAB — LIPID PANEL
Cholesterol: 155 mg/dL (ref 0–200)
HDL: 26 mg/dL — ABNORMAL LOW (ref >39)
LDL Cholesterol: 94 mg/dL (ref 0–99)
Total CHOL/HDL Ratio: 6 RATIO
Triglycerides: 176 mg/dL — ABNORMAL HIGH (ref <150)
VLDL: 35 mg/dL (ref 0–40)

## 2011-09-25 LAB — CBC
HCT: 37.1 % (ref 36.0–46.0)
Hemoglobin: 13 g/dL (ref 12.0–15.0)
MCH: 31.3 pg (ref 26.0–34.0)
MCHC: 35 g/dL (ref 30.0–36.0)
MCV: 89.4 fL (ref 78.0–100.0)
Platelets: 227 10*3/uL (ref 150–400)
RBC: 4.15 MIL/uL (ref 3.87–5.11)
RDW: 14.1 % (ref 11.5–15.5)
WBC: 12.4 10*3/uL — ABNORMAL HIGH (ref 4.0–10.5)

## 2011-09-25 LAB — CARDIAC PANEL(CRET KIN+CKTOT+MB+TROPI)
CK, MB: 3.7 ng/mL (ref 0.3–4.0)
CK, MB: 3.8 ng/mL (ref 0.3–4.0)
Relative Index: 2.3 (ref 0.0–2.5)
Relative Index: 2.7 — ABNORMAL HIGH (ref 0.0–2.5)
Total CK: 164 U/L (ref 7–177)
Total CK: 143 U/L (ref 7–177)
Troponin I: <0.3 ng/mL (ref <0.30)
Troponin I: <0.3 ng/mL (ref <0.30)

## 2011-09-25 LAB — BASIC METABOLIC PANEL
BUN: 15 mg/dL (ref 6–23)
CO2: 25 mEq/L (ref 19–32)
Calcium: 8.8 mg/dL (ref 8.4–10.5)
Chloride: 105 mEq/L (ref 96–112)
Creatinine, Ser: 0.62 mg/dL (ref 0.50–1.10)
GFR calc Af Amer: >90 mL/min (ref >90)
GFR calc non Af Amer: >90 mL/min (ref >90)
Glucose, Bld: 176 mg/dL — ABNORMAL HIGH (ref 70–99)
Potassium: 3.3 mEq/L — ABNORMAL LOW (ref 3.5–5.1)
Sodium: 140 mEq/L (ref 135–145)

## 2011-09-25 LAB — POCT I-STAT TROPONIN I: Troponin i, poc: 0 ng/mL (ref 0.00–0.08)

## 2011-09-25 LAB — TSH: TSH: 3.85 u[IU]/mL (ref 0.350–4.500)

## 2011-09-25 LAB — D-DIMER, QUANTITATIVE (NOT AT ARMC): D-Dimer, Quant: 0.3 ug/mL-FEU (ref 0.00–0.48)

## 2011-09-25 MED ORDER — METFORMIN HCL 500 MG PO TABS
500.0000 mg | ORAL_TABLET | Freq: Every day | ORAL | Status: DC
  Administered 2011-09-25: 500 mg via ORAL
  Filled 2011-09-25 (×2): qty 1

## 2011-09-25 MED ORDER — ONDANSETRON HCL 4 MG PO TABS: 4.0000 mg | ORAL_TABLET | Freq: Four times a day (QID) | ORAL | Status: DC | PRN

## 2011-09-25 MED ORDER — SODIUM CHLORIDE 0.9 % IJ SOLN: 3.0000 mL | INTRAMUSCULAR | Status: DC | PRN

## 2011-09-25 MED ORDER — MORPHINE SULFATE 4 MG/ML IJ SOLN
4.0000 mg | Freq: Once | INTRAMUSCULAR | Status: AC
  Administered 2011-09-25: 4 mg via INTRAVENOUS
  Filled 2011-09-25: qty 1

## 2011-09-25 MED ORDER — SERTRALINE HCL 50 MG PO TABS
75.0000 mg | ORAL_TABLET | Freq: Every day | ORAL | Status: DC
  Administered 2011-09-25: 75 mg via ORAL
  Filled 2011-09-25: qty 1

## 2011-09-25 MED ORDER — ALBUTEROL SULFATE HFA 108 (90 BASE) MCG/ACT IN AERS: 2.0000 | INHALATION_SPRAY | Freq: Four times a day (QID) | RESPIRATORY_TRACT | Status: DC | PRN

## 2011-09-25 MED ORDER — ENOXAPARIN SODIUM 40 MG/0.4ML ~~LOC~~ SOLN
40.0000 mg | Freq: Every day | SUBCUTANEOUS | Status: DC
  Administered 2011-09-25: 40 mg via SUBCUTANEOUS
  Filled 2011-09-25: qty 0.4

## 2011-09-25 MED ORDER — PNEUMOCOCCAL VAC POLYVALENT 25 MCG/0.5ML IJ INJ
0.5000 mL | INJECTION | INTRAMUSCULAR | Status: DC
  Filled 2011-09-25: qty 0.5

## 2011-09-25 MED ORDER — ACETAMINOPHEN 325 MG PO TABS: 650.0000 mg | ORAL_TABLET | ORAL | Status: AC | PRN

## 2011-09-25 MED ORDER — CLOPIDOGREL BISULFATE 75 MG PO TABS
75.0000 mg | ORAL_TABLET | Freq: Every day | ORAL | Status: DC
  Administered 2011-09-25: 75 mg via ORAL
  Filled 2011-09-25: qty 1

## 2011-09-25 MED ORDER — MORPHINE SULFATE 2 MG/ML IJ SOLN: 1.0000 mg | INTRAMUSCULAR | Status: DC | PRN

## 2011-09-25 MED ORDER — NITROGLYCERIN 0.4 MG SL SUBL: 0.4000 mg | SUBLINGUAL_TABLET | SUBLINGUAL | Status: DC | PRN

## 2011-09-25 MED ORDER — METOPROLOL SUCCINATE ER 50 MG PO TB24
50.0000 mg | ORAL_TABLET | Freq: Every day | ORAL | Status: DC
  Administered 2011-09-25: 50 mg via ORAL
  Filled 2011-09-25: qty 1

## 2011-09-25 MED ORDER — LEVOTHYROXINE SODIUM 150 MCG PO TABS
150.0000 ug | ORAL_TABLET | Freq: Every day | ORAL | Status: DC
  Administered 2011-09-25: 150 ug via ORAL
  Filled 2011-09-25 (×2): qty 1

## 2011-09-25 MED ORDER — LISINOPRIL 5 MG PO TABS
5.0000 mg | ORAL_TABLET | Freq: Every day | ORAL | Status: DC
  Administered 2011-09-25: 5 mg via ORAL
  Filled 2011-09-25: qty 1

## 2011-09-25 MED ORDER — PNEUMOCOCCAL VAC POLYVALENT 25 MCG/0.5ML IJ INJ: 0.5000 mL | INJECTION | INTRAMUSCULAR | Status: DC

## 2011-09-25 MED ORDER — ONDANSETRON HCL 4 MG/2ML IJ SOLN: 4.0000 mg | Freq: Four times a day (QID) | INTRAMUSCULAR | Status: DC | PRN

## 2011-09-25 MED ORDER — ASPIRIN 81 MG PO CHEW
324.0000 mg | CHEWABLE_TABLET | Freq: Once | ORAL | Status: AC
  Administered 2011-09-25: 324 mg via ORAL
  Filled 2011-09-25: qty 4

## 2011-09-25 MED ORDER — FLUCONAZOLE 100 MG PO TABS
150.0000 mg | ORAL_TABLET | Freq: Once | ORAL | Status: AC
  Administered 2011-09-25: 150 mg via ORAL
  Filled 2011-09-25: qty 2

## 2011-09-25 MED ORDER — ASPIRIN 81 MG PO CHEW: 81.0000 mg | CHEWABLE_TABLET | Freq: Every day | ORAL | Status: DC

## 2011-09-25 MED ORDER — ONDANSETRON HCL 4 MG/2ML IJ SOLN
4.0000 mg | Freq: Once | INTRAMUSCULAR | Status: AC
  Administered 2011-09-25: 4 mg via INTRAVENOUS
  Filled 2011-09-25: qty 2

## 2011-09-25 MED ORDER — ATORVASTATIN CALCIUM 80 MG PO TABS
80.0000 mg | ORAL_TABLET | Freq: Every day | ORAL | Status: DC
  Filled 2011-09-25: qty 1

## 2011-09-25 NOTE — Progress Notes (Signed)
Subjective: Pt sitting up in bed eating breakfast and talking on phone when I enter. Says she is pain free and feels great. Denies any chest pain, chest wall tenderness, shortness of breath, orthopnea or PND. 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 Denies N/V, new swelling, dizziness. Pt comes in on plavix after receiving BMS in 12/2010. Per her cardiology note from 03/2011, Plavix was to be discontinued in January. Pt said she misunderstood instructions, and thought she was supposed to stop taking it just for a dental procedure.   Objective: Vital signs in last 24 hours: Filed Vitals:   09/25/11 0600 09/25/11 0615 09/25/11 0648 09/25/11 0949  BP: 127/62 103/63 109/68 110/76  Pulse: 72 70 63 76  Temp:   97.8 F (36.6 C)   TempSrc:   Oral   Resp: 19 16 16    Height:   5' 3"  (1.6 m)   Weight:   246 lb 11.1 oz (111.9 kg)   SpO2: 96% 93% 94%    Weight change:  No intake or output data in the 24 hours ending 09/25/11 1058  Vitals reviewed. General: sitting up in bed, eating breakfast and talking on the phone, NAD HEENT: partial absent dentition, PERRL, EOMI, no scleral icterus Cardiac: RRR, no rubs, murmurs or gallops Pulm: clear to auscultation bilaterally, no wheezes, rales, or rhonchi Abd: soft, nontender, nondistended, BS present Ext: warm and well perfused, no pedal edema Neuro: alert and oriented X3, cranial nerves II-XII grossly intact  Lab Results: Basic Metabolic Panel:  Lab 94/49/67 0840 09/25/11 0336  NA 140 142  K 3.3* 3.6  CL 105 104  CO2 25 --  GLUCOSE 176* 122*  BUN 15 15  CREATININE 0.62 0.80  CALCIUM 8.8 --  MG -- --  PHOS -- --   CBC:  Lab 09/25/11 0336 09/25/11 0317  WBC -- 12.4*  NEUTROABS -- --  HGB 13.3 13.0  HCT 39.0 37.1  MCV -- 89.4  PLT -- 227   Cardiac Enzymes:  Lab 09/25/11 0831  CKTOTAL 164  CKMB 3.7  CKMBINDEX --  TROPONINI <0.30    D-Dimer:  Lab 09/25/11 0431  DDIMER 0.30   CBG:  Lab 09/25/11 0733  GLUCAP 159*   Fasting Lipid Panel:  Lab 09/25/11 0832  CHOL 155  HDL 26*  LDLCALC 94  TRIG 176*  CHOLHDL 6.0  LDLDIRECT --   Studies/Results: Dg Chest Portable 1 View  09/25/2011  *RADIOLOGY REPORT*  Clinical Data: Sternal chest pain.  PORTABLE CHEST - 1 VIEW  Comparison: 12/11/2010  Findings: Shallow inspiration.  Normal heart size and pulmonary vascularity.  Scattered fibrosis and peribronchial thickening suggesting chronic bronchitis.  Linear atelectasis in the left lung base.  No focal consolidation.  No edema.  No blunting of costophrenic angles.  IMPRESSION: Shallow inspiration with linear atelectasis in the left lung base. Chronic bronchitic changes.  No active consolidation.  Original Report Authenticated By: Neale Burly, M.D.   Medications: I have reviewed the patient's current medications. Scheduled Meds:   . aspirin  324 mg Oral Once  . aspirin  81 mg Oral Daily  . atorvastatin  80 mg Oral QHS  . enoxaparin (LOVENOX) injection  40 mg Subcutaneous Daily  . fluconazole  150 mg Oral Once  . levothyroxine  150 mcg Oral QAC breakfast  . lisinopril  5 mg Oral Daily  . metFORMIN  500 mg Oral Q  breakfast  . metoprolol succinate  50 mg Oral Daily  .  morphine injection  4 mg Intravenous Once  . ondansetron  4 mg Intravenous Once  . pneumococcal 23 valent vaccine  0.5 mL Intramuscular Tomorrow-1000  . sertraline  75 mg Oral Daily  . DISCONTD: clopidogrel  75 mg Oral Q breakfast   Continuous Infusions:  PRN Meds:.albuterol, morphine injection, nitroGLYCERIN, ondansetron (ZOFRAN) IV, ondansetron, sodium chloride  Assessment/Plan: 1. Chest pain  Some concern for ACS given risk factors of DM II, HTN, CAD with history of stents so pt admitted to r/o ACS. No EKG changes and first CE negative. Likely to be musculoskeletal given recent lifting and stress.  -Cycle CE, 3 sets pending  - Morphine and  nitro prn for pain.  -Watch on Tele  - Risk stratification  - FLP: TChol 155, LDL 95, HDL 26  - HbA1c pending, TSH pending  2. Hypothyroidism -  Chronic history. Will f/u TSH, continue home dose synthroid  3. CAD s/p stents in 10/12.  Still taking ASA and plavix daily and had BMS in 12/2010. Reviewed records and saw that cardiology recommended d/c'ing Plavix in January, pt unaware that she supposed to stop taking Plavix. - D/c Plavix, told pt she no longer needs to continue.  4. DM II -  On metformin and last HgA1c was 6.7. Repeat HbA1c pending. - Continue metformin  5. Hyperlipidemia -  Patient is on simvastatin 80 mg daily. FLP w low HDL as above. - Continue simvastatin while inpatient.  6. Asthma -  Stable, no exacerbation. Continue prn albuterol but suspect that multiple pets in the home may contribute more allergies than asthma.   7. Morbid Obesity -  BMI 43.2, advised weight loss and extensively counseled about dietary indiscretions.   8. Yeast infection-  She called in earlier for a possible yeast infection s/p doxy tx for tick bite and received fluconazole from the ED.  - No further treatment  9. DVT ppx - lovenox [place] daily.   LOS: 0 days   Tonia Brooms 09/25/2011, 10:58 AM

## 2011-09-25 NOTE — Progress Notes (Signed)
Pt and family provided with d/c instructions and education. Pt verbalized understanding. All medications reviewed with pt with verbalized understanding back. NO questions at this time. IV removed with tip intact. Heart monitor cleaned and returend to front. Raffi Milstein burris, Rn

## 2011-09-25 NOTE — ED Notes (Signed)
Pt also st's she has not taken her Metoprolol in 2 days.

## 2011-09-25 NOTE — H&P (Signed)
Hospital Admission Note Date: 09/25/2011  Patient name: Kendra Adams Medical record number: [id number] Date of birth: Aug 05, 1960 Age: 51 y.o. Gender: female PCP: Clinton Gallant, MD  Medical Service: [facility]  Attending physician:  Dr. Eppie Gibson    Internal Medicine Teaching Service Contact Information  1st Contact:  Dr. Sandy Salaam  Pager: [phone] 2nd Contact:  Dr. Posey Pronto  Pager: [phone] After 5 pm or weekends: 1st Contact:      Pager: [phone] 2nd Contact:      Pager: [phone]  Chief Complaint: R sided CP  History of Present Illness: Kendra Adams pmh DM type 2, HTN, and CAD s/p stent 10/12. She states that for the past couple days she has had some intermittent R CP that sometimes radiates to her right mandible and sometimes accompanies a HA. She said pain is worse with exertion and is somewhat relieved by rest. The pain is intermittent in nature and lasts about 1-2 min at a time she was more concerned that the frequency of the pain episodes have increased and therefore came in to be evaluated. She states this doesn't feel like her previous heart attack. She has been compliant with her medication including the plavix and ASA. She has noticed an increase in work lately as she [employer] for a living. Her work is only seasonal and therefore she doesn't do anything during "off season" and with current rain she hasn't been busy. She doesn't do anyother work except during the [place]. So on Tuesday she noticed a slipped belt on the mower and therefore decided to lift the front of the sit-down mower to fix it and she identifies this as the start of her pain. She has been nauseous at home but no vomiting. She denied any abdominal pain, LOC, diaphoresis, SOB, URI sx, or decreased exercise tolerance. She does express some increased difficulty in walking down stairs 2/2 hip pain and worsening OA. Her other main concern is increased stress and frustration with dealing with the MR niece that lives with  her. She feels they are experiencing more strain on their relationship and that is causing some stress and anxiety combined with the increase demands of her job currently.   Meds: Current Outpatient Rx  Name Route Sig Dispense Refill  . ACETAMINOPHEN 325 MG PO TABS Oral Take 2 tablets (650 mg total) by mouth every 4 (four) hours as needed for pain. 50 tablet 2  . ALBUTEROL SULFATE HFA 108 (90 BASE) MCG/ACT IN AERS Inhalation Inhale 2 puffs into the lungs every 6 (six) hours as needed. For asthma    . ASPIRIN 81 MG PO CHEW Oral Chew 81 mg by mouth daily.    . ATORVASTATIN CALCIUM 80 MG PO TABS Oral Take 80 mg by mouth at bedtime.    . CLOPIDOGREL BISULFATE 75 MG PO TABS Oral Take 1 tablet (75 mg total) by mouth daily. 90 tablet 4  . LEVOTHYROXINE SODIUM 150 MCG PO TABS Oral Take 1 tablet (150 mcg total) by mouth daily. 90 tablet 4  . LISINOPRIL 5 MG PO TABS Oral Take 1 tablet (5 mg total) by mouth daily. 90 tablet 4  . METFORMIN HCL 500 MG PO TABS Oral Take 500 mg by mouth daily with breakfast.    . METOPROLOL SUCCINATE ER 50 MG PO TB24 Oral Take 50 mg by mouth daily.    Marland Kitchen FISH OIL 1000 MG PO CAPS Oral Take 1 capsule by mouth daily.    Marland Kitchen OMEPRAZOLE 40 MG  PO CPDR Oral Take 1 capsule (40 mg total) by mouth 2 (two) times daily. 90 capsule 4  . SERTRALINE HCL 50 MG PO TABS Oral Take 1.5 tablets (75 mg total) by mouth daily. 90 tablet 4  . VARENICLINE TARTRATE 1 MG PO TABS Oral Take 1 mg by mouth 2 (two) times daily.      Allergies: Allergies as of 09/25/2011 - Review Complete 09/25/2011  Allergen Reaction Noted  . Nsaids  09/25/2011  . Tramadol hcl Nausea Only 02/22/2010   Past Medical History  Diagnosis Date  . Carpal tunnel syndrome on both sides     right hand surgery 1998, left hand surgery 1999.  . Allergic rhinitis   . Asthma   . Depression   . GERD (gastroesophageal reflux disease)   . Hyperlipidemia   . Hypothyroidism   . Psoriasis   . CAD (coronary artery disease)     cath  12/13/10: mRCA 99%, EF 65-70%;  s/p BMS to mRCA  . HTN (hypertension)   . DM2 (diabetes mellitus, type 2)   . Hyperlipidemia   . MI (myocardial infarction)    Past Surgical History  Procedure Date  . Coronary angioplasty with stent placement   . Carpal tunnel release     w/ bone spurs  . Dilation and curettage of uterus    Family History  Problem Relation Age of Onset  . Diabetes Mother   . Hypertension Mother   . Diabetes Father   . Hypertension Father    History   Social History  . Marital Status: Married    Spouse Name: N/A    Number of Children: 0  . Years of Education: 10th grade   Occupational History  . Conservation officer, nature     self-employed  . 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 Smoking status: Former Smoker -- 0.2 packs/day for 35 years    Types: Cigarettes  . Smokeless tobacco: Current User    Types: Chew   Comment: trying to quit/ USES SMOKELESS CIGARETTES.  Cutting back." '"`  . Alcohol Use: No  . Drug Use: No  . Sexually Active: Not on file   Other Topics Concern  . Not on file   Social History Narrative   Father died of suicide (gunshot) in 83. 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 Patient lives with her husband, her husband's niece Tammy who is mentally retarded and her best friend and the friend's daughter and 6 dogs    Review of Systems: Pertinent items are noted in HPI.  Physical Exam: Blood pressure 108/58, pulse 102, temperature 97.9 F (36.6 C), resp. rate 16, SpO2 96.00%. General: resting in bed comfortably, pleasant HEENT: PERRL, EOMI, no scleral icterus, very poor dentition missing most teeth, no JVD Cardiac: soft, RRR, normal S1/S2, no S3/S4, rubs, murmurs or gallops Pulm: CTAB, moving normal volumes of air, no crackles or wheezes Abd: obese, soft, nontender, nondistended, BS present, no organomegaly Ext: warm and well  perfused, no pedal edema, R pectoralis muscle tight with no tenderness to palpation Neuro: alert and oriented X3, cranial nerves II-XII grossly intact  Lab results: Basic Metabolic Panel:  Basename 09/25/11 0336  NA 142  K 3.6  CL 104  CO2 --  GLUCOSE 122*  BUN 15  CREATININE 0.80  CALCIUM --  MG --  PHOS --  CBC:  Basename 09/25/11 0336 09/25/11 0317  WBC -- 12.4*  NEUTROABS -- --  HGB 13.3 13.0  HCT 39.0 37.1  MCV -- 89.4  PLT -- 227   Cardiac Enzymes: Troponin [facility] Test)  [facility] 09/25/11 0334  TROPIPOC 0.00   Imaging results:  Dg Chest Portable 1 View  09/25/2011  *RADIOLOGY REPORT*  Clinical Data: Sternal chest pain.  PORTABLE CHEST - 1 VIEW  Comparison: 12/11/2010  Findings: Shallow inspiration.  Normal heart size and pulmonary vascularity.  Scattered fibrosis and peribronchial thickening suggesting chronic bronchitis.  Linear atelectasis in the left lung base.  No focal consolidation.  No edema.  No blunting of costophrenic angles.  IMPRESSION: Shallow inspiration with linear atelectasis in the left lung base. Chronic bronchitic changes.  No active consolidation.  Original Report Authenticated By: Neale Burly, M.D.    Other results: EKG: unchanged from previous tracings, sinus tachycardia.  Assessment & Plan by Problem: 1. Chest pain - some concern for ACS given risk factors of DM II, HTN, CAD with history of stents and will need to rule out ACS. No EKG changes and first CE negative. Likely to be musculoskeletal given recent lifting and stress.  -Cycle CE  - Morphine and nitro prn for pain.  -Watch on Tele  -Check TSH, FLP, HgA1c   2. Hypothyroidism - Check TSH, on stable dose of synthroid and will continue.   3. CAD s/p stents in 10/12. Still taking ASA and plavix daily and had BMS. See above, on toprol x-l.   4. DM II - On metformin and last HgA1c was 6.7 and will recheck and leave her on metformin.   5. Hyperlipidemia - Patient is on  simvastatin 80 mg daily and will continue and check FLP.   6. Asthma - Continue prn albuterol but suspect that multiple pets in the home may contribute more allergies than asthma.   7. Morbid Obesity - BMI 43.2, advised weight loss and extensively counseled about dietary indiscretions.   8. Yeast infection- She called in earlier for a possible yeast infection s/p doxy tx for tick bite and received fluconazole from the ED.   9. DVT ppx - lovenox [place] daily.   Signed: Clinton Gallant 09/25/2011, 5:01 AM

## 2011-09-25 NOTE — ED Notes (Signed)
Pt states pain also radiates to jaw

## 2011-09-25 NOTE — H&P (Signed)
Internal Medicine Attending Admission Note Date: 09/25/2011  Patient name: Kendra Adams Medical record number: [id number] Date of birth: 09/16/1960 Age: 51 y.o. Gender: female  I saw and evaluated the patient. I reviewed the resident's note and I agree with the resident's findings and plan as documented in the resident's note with the following corrections.  Chief Complaint(s): Sharp right-sided chest pain.  History - key components related to admission:  Ms. Bagsby is a 51 year old woman with a history of coronary artery disease status post a bare-metal stent in October 2012, diabetes, hypertension, hyperlipidemia, and history of tobacco abuse who presents to the hospital with a four-day history of intermittent sharp chest pain. She was in her usual state of health until approximately 4 days ago when she lifted a heavy lawnmower up on a log in order to change a belt. Her husband and her have also been doing a lot of over the head work by trimming branches using a prolonged cutter. She began to notice intermittent sharp chest pains along the right side of her chest. She stated these pains would occur at any time whether with exertion or rest and last from one to several seconds. The pains would spontaneously resolve. They would then would return at various times, in seconds, minutes, or hours. These very short pains were not associated with shortness of breath, diaphoresis, dizziness, or nausea. These pains are different than the previous anginal pain that led to her coronary stent. She received morphine in the emergency department last night and has had no further episodes of pain. She is without other complaints at this time.  Physical Exam - key components related to admission:  Filed Vitals:   09/25/11 0615 09/25/11 0648 09/25/11 0949 09/25/11 1400  BP: 103/63 109/68 110/76 102/68  Pulse: 70 63 76 68  Temp:  97.8 F (36.6 C)  97.8 F (36.6 C)  TempSrc:  Oral  Oral  Resp: 16 16  16   Height:   5' 3"  (1.6 m)    Weight:  246 lb 11.1 oz (111.9 kg)    SpO2: 93% 94%  98%   General: Well-developed, well-nourished, woman lying comfortably in bed in no acute distress. Chest wall: No muscle spasm, pain not reproducible with chest palpation. Lungs: Clear to auscultation bilaterally without wheezes, rhonchi, or rales. Heart: Regular rate and rhythm without murmurs, rubs, or gallops. Abdomen: Soft, nontender, active bowel sounds. Extremities: Without edema.  Lab results:  Basic Metabolic Panel:  Basename 09/25/11 0840 09/25/11 0336  NA 140 142  K 3.3* 3.6  CL 105 104  CO2 25 --  GLUCOSE 176* 122*  BUN 15 15  CREATININE 0.62 0.80  CALCIUM 8.8 --  MG -- --  PHOS -- --   CBC:  Basename 09/25/11 0336 09/25/11 0317  WBC -- 12.4*  NEUTROABS -- --  HGB 13.3 13.0  HCT 39.0 37.1  MCV -- 89.4  PLT -- 227   Cardiac Enzymes:  Basename 09/25/11 1353 09/25/11 0831  CKTOTAL 143 164  CKMB 3.8 3.7  CKMBINDEX -- --  TROPONINI <0.30 <0.30   D-Dimer:  Basename 09/25/11 0431  DDIMER 0.30   CBG:  Basename 09/25/11 1157 09/25/11 0733  GLUCAP 149* 159*   Hemoglobin A1C:  Basename 09/25/11 0840  HGBA1C 6.6*   Fasting Lipid Panel:  Basename 09/25/11 0832  CHOL 155  HDL 26*  LDLCALC 94  TRIG 176*  CHOLHDL 6.0  LDLDIRECT --   Thyroid Function Tests:  Basename 09/25/11 0840  TSH 3.850  T4TOTAL --  FREET4 --  T3FREE --  THYROIDAB --   Imaging results:  Dg Chest Portable 1 View  09/25/2011  *RADIOLOGY REPORT*  Clinical Data: Sternal chest pain.  PORTABLE CHEST - 1 VIEW  Comparison: 12/11/2010  Findings: Shallow inspiration.  Normal heart size and pulmonary vascularity.  Scattered fibrosis and peribronchial thickening suggesting chronic bronchitis.  Linear atelectasis in the left lung base.  No focal consolidation.  No edema.  No blunting of costophrenic angles.  IMPRESSION: Shallow inspiration with linear atelectasis in the left lung base. Chronic bronchitic changes.   No active consolidation.  Original Report Authenticated By: Neale Burly, M.D.   Other results:  EKG: Normal sinus tachycardia at 102 beats per minute, normal axis, normal intervals, no LVH, no significant Q waves, good R wave progression, nonspecific ST-T changes unchanged from the previous ECG on 03/17/2011.  Assessment & Plan by Problem:  Ms. Mccollister is a 51 year old woman with a history of coronary artery disease status post a previous bare-metal stent, hypertension, hyperlipidemia, and diabetes who presents with a four-day history of intermittent chest pain that would last for seconds and spontaneously resolve. This pain was right-sided and without any other associated symptoms. It was therefore felt to be very atypical chest pain not consistent with coronary artery disease. She has ruled out by serial enzymes. The likely cause of her pain is musculoskeletal related to her heavy lifting and overhead work cutting branches.  1) Right-sided chest pain: Atypical and likely noncardiac, has ruled out for MI. Will start Tylenol over-the-counter for chest pain. She has a followup in 2 weeks in the [place] with her primary care provider which will be kept. She will also followup with Dr. Verl Blalock her cardiologist in October. She's been encouraged to work on risk factor modification in particular to continue to not smoke and to try to lose weight with diet. She will be continued on her aspirin but there is no need to maintain the Plavix and this will be stopped. Her other chronic medications will be continued. I agree with the housestaff's plan to discharge her home today.

## 2011-09-25 NOTE — ED Provider Notes (Signed)
History     CSN: [id number]  Arrival date & time 09/25/11  0235   First MD Initiated Contact with Patient 09/25/11 [phone]      Chief Complaint  Patient presents with  . Chest Pain    (Consider location/radiation/quality/duration/timing/severity/associated sxs/prior treatment) HPI History provided by patient. Right-sided chest pain that radiates to her jaw with onset yesterday. Has history of coronary artery disease with a stent placed and followed by Dr. Jenell Milliner. Pain seems to be worse with exertion and walking up stairs - is somewhat relieved by rest. Pain not worse with movement or deep breath. Moderate in severity. Currently pain-free. No difficulty breathing. Has nausea. No vomiting. Patient had recently lifted up a lawnmower to change a belt and she is concerned that she may have pulled a muscle. No rash. No trauma otherwise. Has stopped smoking and takes all medications as prescribed. Recently finished a prescription of antibiotics after a tick bite believes she has developed a yeast infection, is requesting medication for the same. Past Medical History  Diagnosis Date  . Carpal tunnel syndrome on both sides     right hand surgery 1998, left hand surgery 1999.  . Allergic rhinitis   . Asthma   . Depression   . GERD (gastroesophageal reflux disease)   . Hyperlipidemia   . Hypothyroidism   . Psoriasis   . CAD (coronary artery disease)     cath 12/13/10: mRCA 99%, EF 65-70%;  s/p BMS to mRCA  . HTN (hypertension)   . DM2 (diabetes mellitus, type 2)   . Hyperlipidemia   . MI (myocardial infarction)     Past Surgical History  Procedure Date  . Coronary angioplasty with stent placement   . Carpal tunnel release     w/ bone spurs  . Dilation and curettage of uterus     Family History  Problem Relation Age of Onset  . Diabetes Mother   . Hypertension Mother   . Diabetes Father   . Hypertension Father     History  Substance Use Topics  . Smoking status: Former  Smoker -- 0.2 packs/day for 35 years    Types: Cigarettes  . Smokeless tobacco: Current User    Types: Chew   Comment: trying to quit/ USES SMOKELESS CIGARETTES.  Cutting back." '"`  . Alcohol Use: No    OB History    Grav Para Term Preterm Abortions TAB SAB Ect Mult Living                  Review of Systems  Constitutional: Negative for fever and chills.  HENT: Negative for neck pain and neck stiffness.   Eyes: Negative for pain.  Respiratory: Negative for shortness of breath.   Cardiovascular: Positive for chest pain.  Gastrointestinal: Negative for abdominal pain.  Genitourinary: Negative for dysuria.  Musculoskeletal: Negative for back pain.  Skin: Negative for rash.  Neurological: Negative for headaches.  All other systems reviewed and are negative.    Allergies  Nsaids and Tramadol hcl  Home Medications   Current Outpatient Rx  Name Route Sig Dispense Refill  . ACETAMINOPHEN 325 MG PO TABS Oral Take 2 tablets (650 mg total) by mouth every 4 (four) hours as needed for pain. 50 tablet 2  . ALBUTEROL SULFATE HFA 108 (90 BASE) MCG/ACT IN AERS Inhalation Inhale 2 puffs into the lungs every 6 (six) hours as needed. For asthma    . ASPIRIN 81 MG PO CHEW Oral Chew 81 mg by mouth  daily.    . ATORVASTATIN CALCIUM 80 MG PO TABS Oral Take 80 mg by mouth at bedtime.    . CLOPIDOGREL BISULFATE 75 MG PO TABS Oral Take 1 tablet (75 mg total) by mouth daily. 90 tablet 4  . LEVOTHYROXINE SODIUM 150 MCG PO TABS Oral Take 1 tablet (150 mcg total) by mouth daily. 90 tablet 4  . LISINOPRIL 5 MG PO TABS Oral Take 1 tablet (5 mg total) by mouth daily. 90 tablet 4  . METFORMIN HCL 500 MG PO TABS Oral Take 500 mg by mouth daily with breakfast.    . METOPROLOL SUCCINATE ER 50 MG PO TB24 Oral Take 50 mg by mouth daily.    Marland Kitchen FISH OIL 1000 MG PO CAPS Oral Take 1 capsule by mouth daily.    Marland Kitchen OMEPRAZOLE 40 MG PO CPDR Oral Take 1 capsule (40 mg total) by mouth 2 (two) times daily. 90 capsule 4  .  SERTRALINE HCL 50 MG PO TABS Oral Take 1.5 tablets (75 mg total) by mouth daily. 90 tablet 4  . VARENICLINE TARTRATE 1 MG PO TABS Oral Take 1 mg by mouth 2 (two) times daily.      BP 108/58  Pulse 102  Temp 97.9 F (36.6 C)  Resp 16  SpO2 96%  Physical Exam  Constitutional: She is oriented to person, place, and time. She appears well-developed and well-nourished.  HENT:  Head: Normocephalic and atraumatic.  Eyes: Conjunctivae and EOM are normal. Pupils are equal, round, and reactive to light.  Neck: Trachea normal. Neck supple. No thyromegaly present.  Cardiovascular: Normal rate, regular rhythm, S1 normal, S2 normal and normal pulses.     No systolic murmur is present   No diastolic murmur is present  Pulses:      Radial pulses are 2+ on the right side, and 2+ on the left side.  Pulmonary/Chest: Effort normal and breath sounds normal. She has no wheezes. She has no rhonchi. She has no rales. She exhibits no tenderness.       No rash or crepitus  Abdominal: Soft. Normal appearance and bowel sounds are normal. There is no tenderness. There is no CVA tenderness and negative Murphy's sign.  Musculoskeletal:       BLE:s Calves nontender, no cords or erythema, negative Homans sign  Neurological: She is alert and oriented to person, place, and time. She has normal strength. No cranial nerve deficit or sensory deficit. GCS eye subscore is 4. GCS verbal subscore is 5. GCS motor subscore is 6.  Skin: Skin is warm and dry. No rash noted. She is not diaphoretic.  Psychiatric: Her speech is normal.       Cooperative and appropriate    ED Course  Procedures (including critical care time)  Results for orders placed during the hospital encounter of 09/25/11  CBC      Component Value Range   WBC 12.4 (*) 4.0 - 10.5 K/uL   RBC 4.15  3.87 - 5.11 MIL/uL   Hemoglobin 13.0  12.0 - 15.0 g/dL   HCT 37.1  36.0 - 46.0 %   MCV 89.4  78.0 - 100.0 fL   MCH 31.3  26.0 - 34.0 pg   MCHC 35.0  30.0 -  36.0 g/dL   RDW 14.1  11.5 - 15.5 %   Platelets 227  150 - 400 K/uL  POCT I-STAT, CHEM 8      Component Value Range   Sodium 142  135 - 145 mEq/L  Potassium 3.6  3.5 - 5.1 mEq/L   Chloride 104  96 - 112 mEq/L   BUN 15  6 - 23 mg/dL   Creatinine, Ser 0.80  0.50 - 1.10 mg/dL   Glucose, Bld 122 (*) 70 - 99 mg/dL   Calcium, Ion 1.26 (*) 1.12 - 1.23 mmol/L   TCO2 24  0 - 100 mmol/L   Hemoglobin 13.3  12.0 - 15.0 g/dL   HCT 39.0  36.0 - 46.0 %  POCT I-STAT TROPONIN I      Component Value Range   Troponin i, poc 0.00  0.00 - 0.08 ng/mL   Comment 3            Dg Chest Portable 1 View  09/25/2011  *RADIOLOGY REPORT*  Clinical Data: Sternal chest pain.  PORTABLE CHEST - 1 VIEW  Comparison: 12/11/2010  Findings: Shallow inspiration.  Normal heart size and pulmonary vascularity.  Scattered fibrosis and peribronchial thickening suggesting chronic bronchitis.  Linear atelectasis in the left lung base.  No focal consolidation.  No edema.  No blunting of costophrenic angles.  IMPRESSION: Shallow inspiration with linear atelectasis in the left lung base. Chronic bronchitic changes.  No active consolidation.  Original Report Authenticated By: Neale Burly, M.D.     Date: 09/25/2011  Rate: 103  Rhythm: sinus tachycardia  QRS Axis: normal  Intervals: normal  ST/T Wave abnormalities: nonspecific ST changes  Conduction Disutrbances:none  Narrative Interpretation:   Old EKG Reviewed: unchanged  Aspirin. Nitroglycerin. IV morphine.  EKG, labs and x-ray obtained and reviewed as above. Medicine consult for admission.  4:24 AM case discussed as above with outpatient clinics resident on call who agrees to evaluation and admission. MDM   Atypical chest pain in patient with history of coronary artery disease and stenting. Pain-free and emergency department with workup as above. Plan medical admission.        Teressa Lower, MD 09/25/11 [phone]

## 2011-09-25 NOTE — ED Notes (Signed)
Pt st's the pain she is having comes and goes and is sharp.  While assessing pt she stated that she was having a pain at that time.  No rhythm change on monitor noted.

## 2011-09-25 NOTE — Discharge Summary (Signed)
[facility] Discharge Note  Name: Kendra Adams MRN: [id number] DOB: 1960/12/20 51 y.o.  Date of Admission: 09/25/2011  2:42 AM Date of Discharge: 09/25/2011 Attending Physician: Karren Cobble, MD  Discharge Diagnosis: Principal Problem:  *Muscle strain of chest wall Active Problems:  HYPOTHYROIDISM  HYPERLIPIDEMIA  HYPERTENSION  ASTHMA  CAD (coronary artery disease)  Diabetes mellitus type 2 with complications  Obesity   Discharge Medications: Medication List  As of 09/25/2011  3:58 PM   STOP taking these medications         clopidogrel 75 MG tablet         TAKE these medications         acetaminophen 325 MG tablet   Commonly known as: TYLENOL   Take 2 tablets (650 mg total) by mouth every 4 (four) hours as needed for pain.      albuterol 108 (90 BASE) MCG/ACT inhaler   Commonly known as: PROVENTIL HFA;VENTOLIN HFA   Inhale 2 puffs into the lungs every 6 (six) hours as needed. For asthma      aspirin 81 MG chewable tablet   Chew 81 mg by mouth daily.      atorvastatin 80 MG tablet   Commonly known as: LIPITOR   Take 80 mg by mouth at bedtime.      Fish Oil 1000 MG Caps   Take 1 capsule by mouth daily.      levothyroxine 150 MCG tablet   Commonly known as: SYNTHROID, LEVOTHROID   Take 1 tablet (150 mcg total) by mouth daily.      lisinopril 5 MG tablet   Commonly known as: PRINIVIL,ZESTRIL   Take 1 tablet (5 mg total) by mouth daily.      metFORMIN 500 MG tablet   Commonly known as: GLUCOPHAGE   Take 500 mg by mouth daily with breakfast.      metoprolol succinate 50 MG 24 hr tablet   Commonly known as: TOPROL-XL   Take 50 mg by mouth daily.      omeprazole 40 MG capsule   Commonly known as: PRILOSEC   Take 1 capsule (40 mg total) by mouth 2 (two) times daily.      sertraline 50 MG tablet   Commonly known as: ZOLOFT   Take 1.5 tablets (75 mg total) by mouth daily.      varenicline 1 MG tablet   Commonly known as:  CHANTIX   Take 1 mg by mouth 2 (two) times daily.            Disposition and follow-up:   Kendra Adams was discharged from [facility] in Good condition.  At the hospital follow up visit please address   1) Chest pain  - Pt's symptoms not consistent with cardiac etiology, suspect muscle strain of R pectoralis muscle. Please evaluate for resolution/improvement of symptoms.  2) H/o CAD s/p BMS x2 in 12/2010  - Pt s/p BMS in 2012. Per cardiology note, pt was to d/c plavix in January; however, she has continued taking it in the interim. Instructed her to d/c plavix but continue ASA. Please make sure pt is not taking Plavix at follow-up.  3) Hyperlipidemia  - Pt w low HDL (26) on FLP. On Lipitor as outpatient. May benefit from agent that will increase HDL, please assess.   Follow-up Appointments: Follow-up Information    Follow up with Clinton Gallant, MD on 10/10/2011. (Please come to your appointment at the Internal Medicine  Center at 10:45 am.)    Contact information:   [address] [place] [phone]         Discharge Orders    Future Appointments: Provider: Department: Dept Phone: Center:   10/10/2011 11:00 AM Clinton Gallant, MD [facility] [phone] [facility]     Future Orders Please Complete By Expires   Diet - low sodium heart healthy      Increase activity slowly      Discharge instructions      Comments:   1. We do not think your pain is coming from your heart. It is probably caused by a muscle strain. Please take Tylenol 675m every 4 hours as needed for pain. 2. Please STOP TAKING Plavix. You no longer need this medication. Continue taking aspirin. 3. Please go to your appointment with Dr[facility]g[facility] [facility]. Please seek immediate medical attention for crushing chest pain, shortness of breath, dizziness, or persistent nausea/vomiting.   No wound care      Call MD  for:  persistant nausea and vomiting      Call MD for:  temperature >100.4      Call MD for:  severe uncontrolled pain      Call MD for:  difficulty breathing, headache or visual disturbances      Call MD for:  extreme fatigue      Call MD for:  persistant dizziness or light-headedness         Procedures Performed:  Dg Chest Portable 1 View  09/25/2011  *RADIOLOGY REPORT*  Clinical Data: Sternal chest pain.  PORTABLE CHEST - 1 VIEW  Comparison: 12/11/2010  Findings: Shallow inspiration.  Normal heart size and pulmonary vascularity.  Scattered fibrosis and peribronchial thickening suggesting chronic bronchitis.  Linear atelectasis in the left lung base.  No focal consolidation.  No edema.  No blunting of costophrenic angles.  IMPRESSION: Shallow inspiration with linear atelectasis in the left lung base. Chronic bronchitic changes.  No active consolidation.  Original Report Authenticated By: WNeale Burly M.D.    Admission HPI: Ms. BBrionpmh DM type 2, HTN, and CAD s/p stent 10/12. She states that for the past couple days she has had some intermittent R CP that sometimes radiates to her right mandible and sometimes accompanies a HA. She said pain is worse with exertion and is somewhat relieved by rest. The pain is intermittent in nature and lasts about 1-2 min at a time she was more concerned that the frequency of the pain episodes have increased and therefore came in to be evaluated. She states this doesn't feel like her previous heart attack. She has been compliant with her medication including the plavix and ASA. 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 She doesn't do anyother work except during the [employer] So on Tuesday she noticed a slipped belt on the mower and therefore decided to lift the front of the sit-down mower to fix  it and she identifies this as the start of her pain. She has been nauseous at home but no vomiting. She denied any abdominal pain, LOC, diaphoresis, SOB, URI sx, or decreased exercise tolerance. She does express some increased difficulty in walking down stairs 2/2 hip pain and worsening OA. Her other  main concern is increased stress and frustration with dealing with the MR niece that lives with her. She feels they are experiencing more strain on their relationship and that is causing some stress and anxiety combined with the increase demands of her job currently.    Hospital Course by problem list:  1. Chest wall muscle strain Initially, there was concern for ACS as source of chest pain as pt w DM, HTN, and h/o CAD s/p BMSx2 in October of 2012 and a question of worsening chest pain on exertion. On further evaluation, pt does not describe true chest pain or DOE, but increased pain w lifting. No EKG changes, tropo istat and CE negative x2. Risk stratification notable for Tchol 155, LDL 95, HDL 26, normal HbA1c and TSH. CXR poor quality, no acute changes.   Overnight pt did well and did not complain of any chest pain or shortness of breath. Said that after morphine her pain vanished. Suspect pain is likely to be musculoskeletal given recent lifting and stress, and mild tenderness to palpation of R pectoralis muscle (however exam performed after pain med administration). Pt discharged on Tylenol for pain and will follow up w Dr. Algis Liming in clinic 10/10/11.  2. CAD s/p stents in 10/12.  Pt is s/p  BMSx2 in 12/2010 and still taking ASA and plavix daily. Reviewed records and saw that cardiology recommended d/c'ing Plavix in January, pt unaware that she supposed to stop taking Plavix. Discontinued it during this admission and instructed her that she no longer needs this medication.   3. Hypothyroidism -  Chronic history, TSH wnl (3.85) on admission. Continued home synthroid dose.   4. DM II  On metformin and  last HgA1c was 6.7 in May 2013. Repeat HbA1c 6.6 during this admission. Continued metformin.    5. Hyperlipidemia -  Patient wh/o hyperlipidemia and is on atorvastatin 80 mg daily. FLP notable for low HDL as above. Continued simvastatin while inpatient. Recommend PCP consider adding agent to raise HDL.   6. Asthma -  Stable, no exacerbation. Continued prn albuterol but suspect that multiple pets in the home may contribute more allergies than asthma.   7. Morbid Obesity -  BMI 43.2, advised weight loss and extensively counseled about dietary indiscretions.   8. Yeast infection-  She described symptoms of yeast infection and recently completed course of doxy for tick bite a few weeks ago. Was given one time Diflucan in ED. No further treatment required.  Discharge Vitals:  BP 110/76  Pulse 76  Temp 97.8 F (36.6 C) (Oral)  Resp 16  Ht 5' 3"  (1.6 m)  Wt 246 lb 11.1 oz (111.9 kg)  BMI 43.70 kg/m2  SpO2 94%  Discharge Day Physical Exam General: sitting up in bed, eating breakfast and talking on the phone, NAD  HEENT: partial absent dentition, PERRL, EOMI, no scleral icterus  Cardiac: RRR, no rubs, murmurs or gallops  Pulm: clear to auscultation bilaterally, no wheezes, rales, or rhonchi  Abd: soft, nontender, nondistended, BS present  Ext: warm and well perfused, no pedal edema  Neuro: alert and oriented X3, cranial nerves II-XII grossly intact  Discharge Labs:  Results for orders placed during the hospital encounter of 09/25/11 (from the past 24 hour(s))  CBC     Status: Abnormal   Collection Time   09/25/11  3:17 AM      Component Value Range   WBC 12.4 (*) 4.0 - 10.5 K/uL   RBC 4.15  3.87 - 5.11 MIL/uL  Hemoglobin 13.0  12.0 - 15.0 g/dL   HCT 37.1  36.0 - 46.0 %   MCV 89.4  78.0 - 100.0 fL   MCH 31.3  26.0 - 34.0 pg   MCHC 35.0  30.0 - 36.0 g/dL   RDW 14.1  11.5 - 15.5 %   Platelets 227  150 - 400 K/uL  POCT I-STAT TROPONIN I     Status: Normal   Collection Time    09/25/11  3:34 AM      Component Value Range   Troponin i, poc 0.00  0.00 - 0.08 ng/mL   Comment 3           POCT I-STAT, CHEM 8     Status: Abnormal   Collection Time   09/25/11  3:36 AM      Component Value Range   Sodium 142  135 - 145 mEq/L   Potassium 3.6  3.5 - 5.1 mEq/L   Chloride 104  96 - 112 mEq/L   BUN 15  6 - 23 mg/dL   Creatinine, Ser 0.80  0.50 - 1.10 mg/dL   Glucose, Bld 122 (*) 70 - 99 mg/dL   Calcium, Ion 1.26 (*) 1.12 - 1.23 mmol/L   TCO2 24  0 - 100 mmol/L   Hemoglobin 13.3  12.0 - 15.0 g/dL   HCT 39.0  36.0 - 46.0 %  D-DIMER, QUANTITATIVE     Status: Normal   Collection Time   09/25/11  4:31 AM      Component Value Range   D-Dimer, Quant 0.30  0.00 - 0.48 ug/mL-FEU  GLUCOSE, CAPILLARY     Status: Abnormal   Collection Time   09/25/11  7:33 AM      Component Value Range   Glucose-Capillary 159 (*) 70 - 99 mg/dL   Comment 1 Notify RN    CARDIAC PANEL(CRET KIN+CKTOT+MB+TROPI)     Status: Normal   Collection Time   09/25/11  8:31 AM      Component Value Range   Total CK 164  7 - 177 U/L   CK, MB 3.7  0.3 - 4.0 ng/mL   Troponin I <0.30  <0.30 ng/mL   Relative Index 2.3  0.0 - 2.5  LIPID PANEL     Status: Abnormal   Collection Time   09/25/11  8:32 AM      Component Value Range   Cholesterol 155  0 - 200 mg/dL   Triglycerides 176 (*) <150 mg/dL   HDL 26 (*) >39 mg/dL   Total CHOL/HDL Ratio 6.0     VLDL 35  0 - 40 mg/dL   LDL Cholesterol 94  0 - 99 mg/dL  BASIC METABOLIC PANEL     Status: Abnormal   Collection Time   09/25/11  8:40 AM      Component Value Range   Sodium 140  135 - 145 mEq/L   Potassium 3.3 (*) 3.5 - 5.1 mEq/L   Chloride 105  96 - 112 mEq/L   CO2 25  19 - 32 mEq/L   Glucose, Bld 176 (*) 70 - 99 mg/dL   BUN 15  6 - 23 mg/dL   Creatinine, Ser 0.62  0.50 - 1.10 mg/dL   Calcium 8.8  8.4 - 10.5 mg/dL   GFR calc non Af Amer >90  >90 mL/min   GFR calc Af Amer >90  >90 mL/min  TSH     Status: Normal   Collection Time   09/25/11  8:40  AM       Component Value Range   TSH 3.850  0.350 - 4.500 uIU/mL  HEMOGLOBIN A1C     Status: Abnormal   Collection Time   09/25/11  8:40 AM      Component Value Range   Hemoglobin A1C 6.6 (*) <5.7 %   Mean Plasma Glucose 143 (*) <117 mg/dL  GLUCOSE, CAPILLARY     Status: Abnormal   Collection Time   09/25/11 11:57 AM      Component Value Range   Glucose-Capillary 149 (*) 70 - 99 mg/dL   Comment 1 Notify RN    CARDIAC PANEL(CRET KIN+CKTOT+MB+TROPI)     Status: Abnormal   Collection Time   09/25/11  1:53 PM      Component Value Range   Total CK 143  7 - 177 U/L   CK, MB 3.8  0.3 - 4.0 ng/mL   Troponin I <0.30  <0.30 ng/mL   Relative Index 2.7 (*) 0.0 - 2.5    Signed: Tonia Brooms 09/25/2011, 3:58 PM   Time Spent on Discharge: 76mn

## 2011-09-25 NOTE — Care Management Note (Signed)
    Page 1 of 1   09/25/2011     4:25:08 PM   CARE MANAGEMENT NOTE 09/25/2011  Patient:  NALDA, SHACKLEFORD   Account Number:  [id number]  Date Initiated:  09/25/2011  Documentation initiated by:  GRAVES-BIGELOW,Defne Gerling  Subjective/Objective Assessment:   Pt admitted with cp.     Action/Plan:   CM will speak to pt about PCP  and medication assist. Willf/u   Anticipated DC Date:  09/26/2011   Anticipated DC Plan:  [place]  CM consult      Choice offered to / List presented to:             Status of service:  Completed, signed off Medicare Important Message given?   (If response is "NO", the following Medicare IM given date fields will be blank) Date Medicare IM given:   Date Additional Medicare IM given:    Discharge Disposition:  HOME/SELF CARE  Per UR Regulation:  Reviewed for med. necessity/level of care/duration of stay  If discussed at [place] of Stay Meetings, dates discussed:    Comments:  09-25-11 Kane, RN,BSN [phone] CM did speak to pt and she goes to the clinic and she uses [facility] for meds cost 3.99. No needs at this time from CM.

## 2011-09-25 NOTE — ED Notes (Signed)
Pt c/o CP "on and off for the past couple days", no SOB.  Associated with nausea, HA, and dizziness.

## 2011-09-27 ENCOUNTER — Other Ambulatory Visit: Payer: Self-pay | Admitting: Internal Medicine

## 2011-09-30 ENCOUNTER — Telehealth: Payer: Self-pay | Admitting: *Deleted

## 2011-09-30 NOTE — Telephone Encounter (Signed)
Dr. Eulas Post called the patient on all available numbers. Unable to get in touch of patient. I have not seen the patient in hospital as was off that day. Reviewing the chart and hospital stay, no mention of Anxiety and so agree with Dr. Marinda Elk to call patient for early appointment in clinic for further evaluation and management.

## 2011-09-30 NOTE — Telephone Encounter (Signed)
Pt called back. She has been seen in clinic for Anxiety and Depression by previous PCP- Dr. Marcello Moores. Pt on Zoloft- 75 mg daily. Discussed with patient in detail about her current anxiety and she says she has "a lot on her plate" at present and so is stressed out. Also she has some anxiety prior to recent hospital admission, but didn't mention to the team. She says is okay to wait until 10/10/2011 to see her new Doc - Dr. Algis Liming, who she met in hospital and discuss about this issue again and go ahead from there. Offered her to be seen earlier by some other MD, but she is okay to wait to see her new PCP. She will call back if needed.  Kendra Adams.

## 2011-09-30 NOTE — Telephone Encounter (Signed)
Pt called - was admitted to hospital 09/25/11. Still having some problem with anxiety. Has appt in clinic 10/10/11. Pt would like something for anxiety to have on hand till appt time. Hilda Blades Richelle Glick RN 09/30/11 2:30PM

## 2011-09-30 NOTE — Telephone Encounter (Signed)
Talked with pt. Hilda Blades Brezlyn Manrique RN 09/30/11 10:30AM

## 2011-09-30 NOTE — Telephone Encounter (Signed)
I would refer this question to the inpatient team who recently discharged patient from the hospital.  Anxiety is not listed in her problem list.  She may need to be seen and assessed in clinic if she is having significant anxiety.

## 2011-10-10 ENCOUNTER — Encounter: Payer: Self-pay | Admitting: Internal Medicine

## 2011-10-21 ENCOUNTER — Encounter: Payer: Self-pay | Admitting: Internal Medicine

## 2011-10-21 ENCOUNTER — Ambulatory Visit (INDEPENDENT_AMBULATORY_CARE_PROVIDER_SITE_OTHER): Payer: Self-pay | Admitting: Internal Medicine

## 2011-10-21 VITALS — BP 102/72 | HR 77 | Temp 97.2°F | Ht 65.3 in | Wt 245.5 lb

## 2011-10-21 DIAGNOSIS — F411 Generalized anxiety disorder: Secondary | ICD-10-CM

## 2011-10-21 DIAGNOSIS — E118 Type 2 diabetes mellitus with unspecified complications: Secondary | ICD-10-CM

## 2011-10-21 DIAGNOSIS — E785 Hyperlipidemia, unspecified: Secondary | ICD-10-CM

## 2011-10-21 DIAGNOSIS — Z23 Encounter for immunization: Secondary | ICD-10-CM

## 2011-10-21 DIAGNOSIS — Z0000 Encounter for general adult medical examination without abnormal findings: Secondary | ICD-10-CM

## 2011-10-21 DIAGNOSIS — S29011A Strain of muscle and tendon of front wall of thorax, initial encounter: Secondary | ICD-10-CM

## 2011-10-21 DIAGNOSIS — IMO0002 Reserved for concepts with insufficient information to code with codable children: Secondary | ICD-10-CM

## 2011-10-21 DIAGNOSIS — I2510 Atherosclerotic heart disease of native coronary artery without angina pectoris: Secondary | ICD-10-CM

## 2011-10-21 DIAGNOSIS — E039 Hypothyroidism, unspecified: Secondary | ICD-10-CM

## 2011-10-21 DIAGNOSIS — F329 Major depressive disorder, single episode, unspecified: Secondary | ICD-10-CM

## 2011-10-21 LAB — GLUCOSE, CAPILLARY: Glucose-Capillary: 128 mg/dL — ABNORMAL HIGH (ref 70–99)

## 2011-10-21 MED ORDER — SERTRALINE HCL 50 MG PO TABS: 150.0000 mg | ORAL_TABLET | Freq: Every day | ORAL | Status: DC

## 2011-10-21 NOTE — Assessment & Plan Note (Signed)
Pt was given Tdap shot today. Pt refused colonoscopy and states had a mammogram 1 year ago at a different facility that she believes was normal.

## 2011-10-21 NOTE — Assessment & Plan Note (Signed)
Pt continues to have feelings of sadness regarding passing of her immediate family members. Describes feelings of loneliness and has bouts of tearfulness at home. She is compliant with her Zoloft. New life stressors including financial poverty, incarcerated mentally challenged niece whom she is primary caregiver, and generalized anxiety. Pt is denies SI or HI at this time.  -educated pt on crisis line if at any point felt suicidal/homicidal -gave pt information on [place] services and encouraged pt to set up counseling for depression and anxiety -increased Zoloft 13m

## 2011-10-21 NOTE — Patient Instructions (Signed)
It was a pleasure seeing you again. I am glad you are feeling better after your recent hospitalization. Please stop taking the plavix totally starting today.   In terms of your mood and feelings. Please increase your Zoloft to 177m to help with your feelings about your family and frustration. I highly recommend that you see MBeverly Sessionsto discuss things and hope it will help with your family relationships.  In terms of your smoking. I think improving how you feel and keep talking with JSonia Sideit will help :) And I know you can quit soon, keep trying.  I hope also as your mood starts feeling better you will be able to eat better also once you get your dentures.   Thank you.

## 2011-10-21 NOTE — Assessment & Plan Note (Signed)
Pt reports no symptoms and compliant with medications. No changes.  -continue Synthroid 115mg
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
## 2011-10-21 NOTE — Assessment & Plan Note (Signed)
Pt last HgbA1c was 6.6 on 7/13. Pt reports compliance with her Metformin.  -will continue current medications and no new changes

## 2011-10-21 NOTE — Assessment & Plan Note (Signed)
Pt main concern today was "nerves." Her husband, Sonia Side, also states pt has been more anxious lately and easily irritable. GAD-7 was performed with pt and her score was a 19 along with somewhat difficulty performing daily activities.  -Pt was given information of [facility] services and highly encouraged to seek counseling options there -Increase of Zoloft to 18m  -did inform pt on the need to consistently take new prescribed dose and that mood changes may take affect in 2-4 weeks after this change.  -f/u with pt in 1 month for possible re-evaluation of medication changes

## 2011-10-21 NOTE — Assessment & Plan Note (Signed)
Pt on last hospital admission 7/13 found to have elevated TG and low HDL. She is currently taking Atorvastatin 26m and reports compliance with this medication. Pt reports life style changes of increasingly poor eating habits (fast food, red meats, and fried foods) along with continuation of smoking recently. She believes her barrier to smoking cessation is the life stresses that she is under. Medication options of niacin or a fibrate would seem plausible but both increase the risk of statin myopathy. Also pt seems to intermittently take Omega-3-fatty acids.  -educated pt on positive life-style changes of better food choices, smoking cessation, continuing Omega-3 fish oil, exercise benefits -will continue to monitor with no medication changes at this point

## 2011-10-21 NOTE — Assessment & Plan Note (Signed)
Pt had BMS placed in 9/12 and was on Plavix at recent hospitalization. Pt still states that she takes plavix "intermittently," believing it has protective qualities. Per Cardiology recommendations pt should d/c Plavix at this point and continue ASA 65m daily.  -Pt was educated on the uses of Plavix and that she no longer needs to continue taking Plavix -Pt understood and agreed to d/c Plavix

## 2011-10-21 NOTE — Progress Notes (Signed)
INTERNAL MEDICINE TEACHING ATTENDING ADDENDUM - Karren Cobble, MD: I personally saw and evaluated Ms. Tomasetti in this clinic visit in conjunction with the resident, Dr. Algis Liming. I have discussed the patient's plan of care with Dr. Algis Liming during this visit. I have confirmed the physical exam findings and have read and agree with the clinic note including the plan.

## 2011-10-21 NOTE — Assessment & Plan Note (Signed)
Pt here to f/u after recent hospitalization regarding CP 2/2 to muscle strain after lifting a lawn mower. Pt states that pain is fully resolved and doing well. She is no longer lifting heavy machinery on her own.  -Pt was counseled on proper limits and techniques of lifting heavy objects

## 2011-11-06 ENCOUNTER — Telehealth: Payer: Self-pay | Admitting: *Deleted

## 2011-11-06 NOTE — Telephone Encounter (Signed)
Pt left message that her nerves were bad and Zoloft is not helping. Message forward to Dr Algis Liming and left message that we are working on her problem.  Phone # pt left on recording - [phone]. Hilda Blades Saher Davee RN 11/06/11 2:10PM

## 2011-12-10 ENCOUNTER — Encounter: Payer: Self-pay | Admitting: Sports Medicine

## 2011-12-10 ENCOUNTER — Ambulatory Visit
Admission: RE | Admit: 2011-12-10 | Discharge: 2011-12-10 | Disposition: A | Discharge status: Home / Self Care | Payer: No Typology Code available for payment source | Source: Ambulatory Visit | Attending: Sports Medicine | Admitting: Sports Medicine

## 2011-12-10 ENCOUNTER — Ambulatory Visit (INDEPENDENT_AMBULATORY_CARE_PROVIDER_SITE_OTHER): Payer: Self-pay | Admitting: Sports Medicine

## 2011-12-10 VITALS — BP 114/77 | HR 84 | Ht 63.0 in | Wt 245.0 lb

## 2011-12-10 DIAGNOSIS — M79609 Pain in unspecified limb: Secondary | ICD-10-CM

## 2011-12-10 DIAGNOSIS — M79641 Pain in right hand: Secondary | ICD-10-CM

## 2011-12-10 NOTE — Progress Notes (Signed)
  Subjective:    Patient ID: Kendra Adams, female    DOB: 1960-12-28, 51 y.o.   MRN: [id number]  HPI Pt states that about 1 month she got the 5th digit of the R hand caught in the wheels of a lawn mower while loading it onto a truck.  Since that time she has had intermittent sharp pain at the MCP joint with occasional swelling and bruising at the 5th metacarpal.    Pt states that she is L-handed and perhaps ambidextrous.   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 Right fifth finger pain with ultrasound evidence of possible avulsion fracture  Three-view x-ray of the right hand was obtained. He was independently viewed by myself. Although the radiologist interpretation is for no acute findings, there does appear to be an area of lucency along the ulnar most aspect of the proximal phalanx at the MCP joint. It is best seen on the AP view. There is no joint subluxation. She has diffuse degenerative changes throughout the hand and wrist.  My recommendation is for the patient to buddy taped her fourth and fifth fingers for the next month and followup with me in 4 weeks. We will repeat her ultrasound. I explained to her that these types of fractures are quite stable but just take a little time to heal. At her followup visit she would also like to address some chronic bilateral knee pain.

## 2011-12-15 ENCOUNTER — Encounter: Payer: Self-pay | Admitting: Internal Medicine

## 2011-12-15 ENCOUNTER — Telehealth: Payer: Self-pay | Admitting: *Deleted

## 2011-12-15 ENCOUNTER — Ambulatory Visit (INDEPENDENT_AMBULATORY_CARE_PROVIDER_SITE_OTHER): Payer: Self-pay | Admitting: Internal Medicine

## 2011-12-15 VITALS — BP 126/85 | HR 85 | Temp 98.4°F | Ht 65.3 in | Wt 244.6 lb

## 2011-12-15 DIAGNOSIS — E118 Type 2 diabetes mellitus with unspecified complications: Secondary | ICD-10-CM

## 2011-12-15 DIAGNOSIS — W57XXXA Bitten or stung by nonvenomous insect and other nonvenomous arthropods, initial encounter: Secondary | ICD-10-CM | POA: Insufficient documentation

## 2011-12-15 DIAGNOSIS — T148 Other injury of unspecified body region: Secondary | ICD-10-CM

## 2011-12-15 DIAGNOSIS — Z0000 Encounter for general adult medical examination without abnormal findings: Secondary | ICD-10-CM

## 2011-12-15 LAB — GLUCOSE, CAPILLARY: Glucose-Capillary: 129 mg/dL — ABNORMAL HIGH (ref 70–99)

## 2011-12-15 NOTE — Patient Instructions (Signed)
-  Apply benadryl cream as directed.  You may also try Claritin 53m daily to help with itching.  -If you notice increased wound woozing or discharge, return to clinic  Please be sure to bring all of your medications with you to every visit.  Should you have any new or worsening symptoms, please be sure to call the clinic at [phone]

## 2011-12-15 NOTE — Assessment & Plan Note (Signed)
Flu shot today 

## 2011-12-15 NOTE — Assessment & Plan Note (Signed)
Suggest application of benadryl topical cream and antihistamine use (claritin daily) to treat pruritis.  No new fatigue/fevers/arthralgias so less likely tick borne.  Instructed to RTC if symptoms worsen or fail to improve.  Also instructed to avoid use of EtOH or H2O2

## 2011-12-15 NOTE — Progress Notes (Signed)
Subjective:   Patient ID: Kendra Adams female   DOB: [birth year] 51 y.o.   MRN: [id number]  HPI: Kendra Adams is a 51 y.o. woman with h/o depression, hypothyroidism, CAD, HLD & DM presents for an acute visit.  On Friday (12/12/11) she was working in yard and cutting some wistera (a type of strong vine), a vine struck her neck and when she went in she had started itching and had two marks on her neck, like a bite, it has gotten progressively worse and looks like a burn now, it is "itching me to death, i could claw the skin off" she is afraid it is a spider bite and wishes to be seen. Has tried hydrogen peroxide & rubbing alcohol, which worsened the itching. Has not tried anything else. No new joint pains/fevers/chills.  No new nausea/vomiting.    Past Medical History  Diagnosis Date  . Carpal tunnel syndrome on both sides     right hand surgery 1998, left hand surgery 1999.  . Allergic rhinitis   . Asthma   . Depression   . GERD (gastroesophageal reflux disease)   . Hyperlipidemia   . Hypothyroidism   . Psoriasis   . CAD (coronary artery disease)     cath 12/13/10: mRCA 99%, EF 65-70%;  s/p BMS to mRCA  . HTN (hypertension)   . DM2 (diabetes mellitus, type 2)   . Hyperlipidemia   . MI (myocardial infarction)    Current Outpatient Prescriptions  Medication Sig Dispense Refill  . acetaminophen (TYLENOL) 325 MG tablet Take 2 tablets (650 mg total) by mouth every 4 (four) hours as needed for pain.  50 tablet  2  . albuterol (PROVENTIL HFA;VENTOLIN HFA) 108 (90 BASE) MCG/ACT inhaler Inhale 2 puffs into the lungs every 6 (six) hours as needed. For asthma      . aspirin 81 MG chewable tablet Chew 81 mg by mouth daily.      Marland Kitchen atorvastatin (LIPITOR) 80 MG tablet Take 80 mg by mouth at bedtime.      Marland Kitchen levothyroxine (SYNTHROID, LEVOTHROID) 150 MCG tablet Take 1 tablet (150 mcg total) by mouth daily.  90 tablet  4  . lisinopril (PRINIVIL,ZESTRIL) 5 MG tablet Take 1 tablet (5 mg total) by mouth  daily.  90 tablet  4  . metFORMIN (GLUCOPHAGE) 500 MG tablet Take 500 mg by mouth daily with breakfast.      . metoprolol succinate (TOPROL-XL) 50 MG 24 hr tablet Take 50 mg by mouth daily.      . Omega-3 Fatty Acids (FISH OIL) 1000 MG CAPS Take 1 capsule by mouth daily.      Marland Kitchen omeprazole (PRILOSEC) 40 MG capsule TAKE 1 CAPSULE BY MOUTH TWICE DAILY  60 capsule  2  . sertraline (ZOLOFT) 50 MG tablet Take 3 tablets (150 mg total) by mouth daily.  90 tablet  4  . varenicline (CHANTIX) 1 MG tablet Take 1 mg by mouth 2 (two) times daily.      Marland Kitchen DISCONTD: sodium chloride (OCEAN NASAL SPRAY) 0.65 % nasal spray 1 spray by Nasal route as needed for congestion.  45 mL  3   Family History  Problem Relation Age of Onset  . Diabetes Mother   . Hypertension Mother   . Diabetes Father   . Hypertension Father    History   Social History  . Marital Status: Married    Spouse Name: N/A    Number of Children: 0  . Years of Education:  10th grade   Occupational History  . Conservation officer, nature     self-employed  . 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 Smoking status: Current Some Day Smoker -- 0.4 packs/day for 35 years    Types: Cigarettes  . Smokeless tobacco: Current User    Types: Chew   Comment: trying to quit/ USES SMOKELESS CIGARETTES.  Cutting back." '"`  . Alcohol Use: No  . Drug Use: No  . Sexually Active: None   Other Topics Concern  . None   Social History Narrative   Father died of suicide (gunshot) in May 07, 1996. 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 Patient lives with her husband, her husband's niece Tammy who is mentally retarded and her best friend and the friend's daughter and 6 dogs   Review of Systems: Constitutional: Denies fever, chills, diaphoresis, appetite change and fatigue.  HEENT: Denies photophobia, eye pain, redness, hearing loss, ear pain, congestion, sore  throat, rhinorrhea, sneezing, mouth sores, trouble swallowing, neck pain, neck stiffness and tinnitus.   Respiratory: Denies SOB, DOE, cough, chest tightness,  and wheezing.   Cardiovascular: Denies chest pain, palpitations and leg swelling.  Gastrointestinal: Denies nausea, vomiting, abdominal pain, diarrhea, constipation, blood in stool and abdominal distention.  Genitourinary: Denies dysuria, urgency, frequency, hematuria, flank pain and difficulty urinating.  Musculoskeletal: Denies myalgias, back pain, joint swelling, arthralgias and gait problem.  Skin: Denies pallor, rash and wound.  Neurological: Denies dizziness, seizures, syncope, weakness, light-headedness, numbness and headaches.  Psychiatric/Behavioral: Denies suicidal ideation, mood changes, confusion, nervousness, sleep disturbance and agitation  Objective:  Physical Exam: Filed Vitals:   12/15/11 1552  BP: 126/85  Pulse: 85  Temp: 98.4 F (36.9 C)  TempSrc: Oral  Height: 5' 5.3" (1.659 m)  Weight: 244 lb 9.6 oz (110.95 kg)  SpO2: 94%   Constitutional: Vital signs reviewed.  Patient is a well-developed and well-nourished woman in no acute distress and cooperative with exam.  Mouth: no erythema or exudates, MMM Eyes: PERRL, EOMI, conjunctivae normal, No scleral icterus.  Cardiovascular: RRR, S1 normal, S2 normal, no MRG, pulses symmetric and intact bilaterally Pulmonary/Chest: CTAB, no wheezes, rales, or rhonchi Abdominal: Soft. Non-tender, non-distended, bowel sounds are normal, no masses, organomegaly, or guarding present.  Neurological: A&O x3 Skin: four 0.5 cm diameter brownish skin lesions with surrounding erythema but without purulent discharge/serous drainage - 2 on right anterior chest, one on right neck and one behind right ear Psychiatric: Normal mood and affect. speech and behavior is normal. Judgment and thought content normal. Cognition and memory are normal.   Assessment & Plan:  Case and care discussed  with Dr. Murlean Caller. Please see problem oriented charting for further details. Patient to return prn regarding acute issue, otherwise as per PCP.

## 2011-12-15 NOTE — Telephone Encounter (Signed)
States Friday she was working in yard and cutting some wisteria, a vine struck her neck and when she went in she had started itching and had two marks on her neck, like a bite, it has gotten progressively worse and looks like a burn now, it is "itching me to death, i could claw the skin off" she is afraid it is a spider bite and wishes to be seen. appt per [place]. [id number] today dr Burnard Bunting

## 2011-12-15 NOTE — Telephone Encounter (Signed)
Needs to be seen today or tomorrow, if not go to the ED. Thanks.

## 2012-01-13 ENCOUNTER — Other Ambulatory Visit: Payer: Self-pay | Admitting: Ophthalmology

## 2012-01-13 DIAGNOSIS — E118 Type 2 diabetes mellitus with unspecified complications: Secondary | ICD-10-CM

## 2012-01-13 DIAGNOSIS — I10 Essential (primary) hypertension: Secondary | ICD-10-CM

## 2012-01-14 MED ORDER — LISINOPRIL 5 MG PO TABS: 5.0000 mg | ORAL_TABLET | Freq: Every day | ORAL | Status: DC

## 2012-03-18 ENCOUNTER — Encounter: Payer: Self-pay | Admitting: Internal Medicine

## 2012-04-05 ENCOUNTER — Other Ambulatory Visit: Payer: Self-pay

## 2012-04-05 ENCOUNTER — Other Ambulatory Visit: Payer: Self-pay | Admitting: *Deleted

## 2012-04-05 DIAGNOSIS — F411 Generalized anxiety disorder: Secondary | ICD-10-CM

## 2012-04-05 DIAGNOSIS — F329 Major depressive disorder, single episode, unspecified: Secondary | ICD-10-CM

## 2012-04-05 MED ORDER — METOPROLOL SUCCINATE ER 50 MG PO TB24: 50.0000 mg | ORAL_TABLET | Freq: Every day | ORAL | Status: DC

## 2012-04-05 MED ORDER — SERTRALINE HCL 50 MG PO TABS: 150.0000 mg | ORAL_TABLET | Freq: Every day | ORAL | Status: DC

## 2012-04-07 ENCOUNTER — Other Ambulatory Visit: Payer: Self-pay

## 2012-04-08 ENCOUNTER — Ambulatory Visit: Payer: Self-pay | Admitting: Internal Medicine

## 2012-04-08 ENCOUNTER — Encounter: Payer: Self-pay | Admitting: Internal Medicine

## 2012-04-08 ENCOUNTER — Ambulatory Visit (HOSPITAL_COMMUNITY)
Admission: RE | Admit: 2012-04-08 | Discharge: 2012-04-08 | Disposition: A | Discharge status: Home / Self Care | Payer: Self-pay | Source: Ambulatory Visit | Attending: Internal Medicine | Admitting: Internal Medicine

## 2012-04-08 ENCOUNTER — Ambulatory Visit (INDEPENDENT_AMBULATORY_CARE_PROVIDER_SITE_OTHER): Payer: Self-pay | Admitting: Internal Medicine

## 2012-04-08 VITALS — BP 108/76 | HR 78 | Temp 97.6°F | Ht 64.0 in | Wt 248.0 lb

## 2012-04-08 DIAGNOSIS — M545 Low back pain, unspecified: Secondary | ICD-10-CM | POA: Insufficient documentation

## 2012-04-08 DIAGNOSIS — Z79899 Other long term (current) drug therapy: Secondary | ICD-10-CM

## 2012-04-08 DIAGNOSIS — E119 Type 2 diabetes mellitus without complications: Secondary | ICD-10-CM

## 2012-04-08 DIAGNOSIS — M47817 Spondylosis without myelopathy or radiculopathy, lumbosacral region: Secondary | ICD-10-CM | POA: Insufficient documentation

## 2012-04-08 DIAGNOSIS — I10 Essential (primary) hypertension: Secondary | ICD-10-CM

## 2012-04-08 DIAGNOSIS — M549 Dorsalgia, unspecified: Secondary | ICD-10-CM

## 2012-04-08 DIAGNOSIS — G8929 Other chronic pain: Secondary | ICD-10-CM

## 2012-04-08 DIAGNOSIS — F17200 Nicotine dependence, unspecified, uncomplicated: Secondary | ICD-10-CM

## 2012-04-08 DIAGNOSIS — E118 Type 2 diabetes mellitus with unspecified complications: Secondary | ICD-10-CM

## 2012-04-08 LAB — POCT GLYCOSYLATED HEMOGLOBIN (HGB A1C): Hemoglobin A1C: 6.8

## 2012-04-08 LAB — GLUCOSE, CAPILLARY: Glucose-Capillary: 132 mg/dL — ABNORMAL HIGH (ref 70–99)

## 2012-04-08 MED ORDER — VARENICLINE TARTRATE 1 MG PO TABS: 1.0000 mg | ORAL_TABLET | Freq: Two times a day (BID) | ORAL | Status: DC

## 2012-04-08 NOTE — Progress Notes (Signed)
Subjective:   Patient ID: Kendra Adams female   DOB: 08-30-1960 52 y.o.   MRN: [id number]  HPI: [name] is a 52 y.o. woman with a past medical history of hyperlipidemia, diabetes type 2, and hypertension here to address some ongoing back pain. Patient states that she had a very remote injury several years ago and was even using a walker for extended period of time. Patient stated that she did not seek medical care as symptoms improved. Patient has not noticed any saddle anesthesia, shooting pain, trouble ambulating, weakness, urinary or bowel incontinence, or limited range of motion. Patient has not had any associated fevers or chills or rashes. Patient states the pain is mild aching in nature intermittent and usually self resolving, with worse in the morning and then gradual improvement. Patient is still interested in quitting smoking at this time and ran out of prescription of Chantix. Patient smoked since the age of 61 and feels that Chantix is helping her. Patient has been compliant with all other medications  Patient also complaining of mild temporal headache self resolving for the past one to 2 days. Patient has had increase anxiety and stress regarding Medicare applications. Patient is still able to sleep well. Patient denied any photophobia or phonophobia associated with the headache and no blurry vision.   Past Medical History  Diagnosis Date  . Carpal tunnel syndrome on both sides     right hand surgery 1998, left hand surgery 1999.  . Allergic rhinitis   . Asthma   . Depression   . GERD (gastroesophageal reflux disease)   . Hyperlipidemia   . Hypothyroidism   . Psoriasis   . CAD (coronary artery disease)     cath 12/13/10: mRCA 99%, EF 65-70%;  s/p BMS to mRCA  . HTN (hypertension)   . DM2 (diabetes mellitus, type 2)   . Hyperlipidemia   . MI (myocardial infarction)    Current Outpatient Prescriptions  Medication Sig Dispense Refill  . acetaminophen (TYLENOL) 325 MG  tablet Take 2 tablets (650 mg total) by mouth every 4 (four) hours as needed for pain.  50 tablet  2  . albuterol (PROVENTIL HFA;VENTOLIN HFA) 108 (90 BASE) MCG/ACT inhaler Inhale 2 puffs into the lungs every 6 (six) hours as needed. For asthma      . aspirin 81 MG chewable tablet Chew 81 mg by mouth daily.      Marland Kitchen atorvastatin (LIPITOR) 80 MG tablet Take 80 mg by mouth at bedtime.      Marland Kitchen levothyroxine (SYNTHROID, LEVOTHROID) 150 MCG tablet Take 1 tablet (150 mcg total) by mouth daily.  90 tablet  4  . lisinopril (PRINIVIL,ZESTRIL) 5 MG tablet Take 1 tablet (5 mg total) by mouth daily.  90 tablet  1  . metFORMIN (GLUCOPHAGE) 500 MG tablet Take 500 mg by mouth daily with breakfast.      . metoprolol succinate (TOPROL-XL) 50 MG 24 hr tablet Take 1 tablet (50 mg total) by mouth daily.  30 tablet  0  . Omega-3 Fatty Acids (FISH OIL) 1000 MG CAPS Take 1 capsule by mouth daily.      Marland Kitchen omeprazole (PRILOSEC) 40 MG capsule TAKE 1 CAPSULE BY MOUTH TWICE DAILY  60 capsule  2  . sertraline (ZOLOFT) 50 MG tablet Take 3 tablets (150 mg total) by mouth daily.  270 tablet  1  . varenicline (CHANTIX) 1 MG tablet Take 1 mg by mouth 2 (two) times daily.      . [  DISCONTINUED] sodium chloride (OCEAN NASAL SPRAY) 0.65 % nasal spray 1 spray by Nasal route as needed for congestion.  45 mL  3   Family History  Problem Relation Age of Onset  . Diabetes Mother   . Hypertension Mother   . Diabetes Father   . Hypertension Father    History   Social History  . Marital Status: Married    Spouse Name: N/A    Number of Children: 0  . Years of Education: 10th grade   Occupational History  . Conservation officer, nature     self-employed  . 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 Smoking status: Current Some Day Smoker -- 0.4 packs/day for 35 years    Types: Cigarettes  . Smokeless tobacco: Current User    Types: Chew     Comment: trying to quit/ USES SMOKELESS CIGARETTES.  Cutting back." '"`  . Alcohol Use:  No  . Drug Use: No  . Sexually Active: None   Other Topics Concern  . None   Social History Narrative   Father died of suicide (gunshot) in 27-May-1996. 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 Patient lives with her husband, her husband's niece Kendra Adams who is mentally retarded and her best friend and the friend's daughter and 6 dogs   Review of Systems: otherwise negative unless listed in HPI  Objective:  Physical Exam: Filed Vitals:   04/08/12 1015  BP: 108/76  Pulse: 78  Temp: 97.6 F (36.4 C)  TempSrc: Oral  Height: 5' 4"  (1.626 m)  Weight: 248 lb (112.492 kg)  SpO2: 96%   General: NAD HEENT: PERRL, EOMI, no scleral icterus Cardiac: RRR, no rubs, murmurs or gallops Pulm: clear to auscultation bilaterally, moving normal volumes of air Abd: soft, obese, nontender, nondistended, BS present MS: no paraspinal tenderness, no bone tenderness palpation over spine, FROM, normal walking on heels and tip topes w/o pain, normal ambulation, normal sensation, normal reflexes Ext: warm and well perfused, no pedal edema Neuro: alert and oriented X3, cranial nerves II-XII grossly intact  Assessment & Plan:  1.chronic low back pain: Patient states that had a remote injury back in 1998-05-27 and has to use a walker for an extended period of time and has had some residual problems at that time. Per her extensive chart review no indication that patient sought care for this injury or that any imaging of her back has ever been done in the past. Patient's physical exam is nonlocalizing and normal today. Pt does appear to possibly have hx of psoriasis and therefore psoriatic arthritis maybe a possibility will await imaging results as pt did describe some morning stiffness.  -X-ray lumbar spine -Possible referral to PT  2.DM: Patient hemoglobin A1c is stable today at 6.8 compared to 6.6 on 7/13. Patient is compliant with  medications and no new changes were made today. -Patient received foot exam -Patient had flu shot back in 10/13 -Smoking cessation materials and prescription for Chantix  3.hypertension: Patient continues to have good control on current medications and no changes were made.  Pt discussed with Dr. Daryll Drown

## 2012-04-12 ENCOUNTER — Other Ambulatory Visit: Payer: Self-pay | Admitting: *Deleted

## 2012-04-12 MED ORDER — METOPROLOL SUCCINATE ER 50 MG PO TB24: 50.0000 mg | ORAL_TABLET | Freq: Every day | ORAL | Status: DC

## 2012-04-12 NOTE — Telephone Encounter (Signed)
Calling to refill Metoprolol ER 46m, #30, 0 refills Patient needs to schedule follow for more refills (jan 2013)

## 2012-04-23 ENCOUNTER — Other Ambulatory Visit: Payer: Self-pay | Admitting: Cardiology

## 2012-04-23 ENCOUNTER — Other Ambulatory Visit: Payer: Self-pay | Admitting: Ophthalmology

## 2012-04-24 ENCOUNTER — Other Ambulatory Visit: Payer: Self-pay | Admitting: Ophthalmology

## 2012-04-26 MED ORDER — METFORMIN HCL 500 MG PO TABS: 500.0000 mg | ORAL_TABLET | Freq: Every day | ORAL | Status: DC

## 2012-04-29 ENCOUNTER — Encounter: Payer: Self-pay | Admitting: Internal Medicine

## 2012-04-29 ENCOUNTER — Ambulatory Visit (INDEPENDENT_AMBULATORY_CARE_PROVIDER_SITE_OTHER): Payer: Self-pay | Admitting: Internal Medicine

## 2012-04-29 VITALS — BP 115/82 | HR 99 | Temp 99.0°F | Ht 64.0 in | Wt 253.9 lb

## 2012-04-29 DIAGNOSIS — R059 Cough, unspecified: Secondary | ICD-10-CM

## 2012-04-29 DIAGNOSIS — J019 Acute sinusitis, unspecified: Secondary | ICD-10-CM

## 2012-04-29 DIAGNOSIS — E119 Type 2 diabetes mellitus without complications: Secondary | ICD-10-CM

## 2012-04-29 DIAGNOSIS — J329 Chronic sinusitis, unspecified: Secondary | ICD-10-CM | POA: Insufficient documentation

## 2012-04-29 DIAGNOSIS — E118 Type 2 diabetes mellitus with unspecified complications: Secondary | ICD-10-CM

## 2012-04-29 DIAGNOSIS — R05 Cough: Secondary | ICD-10-CM

## 2012-04-29 LAB — GLUCOSE, CAPILLARY: Glucose-Capillary: 140 mg/dL — ABNORMAL HIGH (ref 70–99)

## 2012-04-29 MED ORDER — AMOXICILLIN 875 MG PO TABS: 875.0000 mg | ORAL_TABLET | Freq: Two times a day (BID) | ORAL | Status: DC

## 2012-04-29 MED ORDER — NITROGLYCERIN 0.4 MG SL SUBL: 0.4000 mg | SUBLINGUAL_TABLET | SUBLINGUAL | Status: DC | PRN

## 2012-04-29 NOTE — Progress Notes (Signed)
Patient ID: Kendra Adams, female   DOB: 02-27-1961, 52 y.o.   MRN: [id number] History of present illness: Kendra Adams is a 52 year woman with past medical history of hypertension, GERD, asthma, hypothyroidism, generalized anxiety disorder presents today for sinus problem. She reported having yellow purulent nasal drainage in the past 3 weeks. She also has tenderness under her left eye as well as a headache. She has subjective fever but did not measure her temperature. She also has green sputum along with her cough. She has recent sick contact with her great nephew who recently had a cold. She states that she just feels "bad" overall.  She has some midsternal chest pain that was sharp yesterday lasting for a few seconds. No radiation. Denies any nausea vomiting, shortness of breath. She denies any ear pain.  Review of system: As per history of present illness  Physical examination: General: alert, well-developed, and cooperative to examination.  HEENT: Erythematous nasal mucosa with purulent drainage, tenderness to palpation of the maxillary sinus. No tenderness over the frontal sinus. Tympanic membranes unremarkable. Mouth no oral exudate or erythema noted.  Lungs: normal respiratory effort, no accessory muscle use, normal breath sounds, no crackles, and no wheezes. Heart: normal rate, regular rhythm, no murmur, no gallop, and no rub.  Abdomen: soft, non-tender, normal bowel sounds, no distention, no guarding, no rebound tenderness Neurologic: nonfocal Skin: turgor normal and no rashes.  Psych: appropriate

## 2012-04-29 NOTE — Assessment & Plan Note (Signed)
She probably has viral sinusitis with superimposed bacterial infection given the 3 weeks duration as well as purulent nasal drainage as well as green sputum. She also has subjective fever and cough. -Will treat with amoxicillin 875 mg by mouth twice a day. Although it is recommended that we treat bacterial sinusitis with Augmentin however patient does not have insurance and will not be able to afford Augmentin) -Will also get a chest x-ray to rule out pneumonia -Patient was instructed to followup in 2 weeks if no improvement

## 2012-04-29 NOTE — Patient Instructions (Addendum)
Get Chest Xray today Take Amoxicillin 875 mg one tablet twice daily x 14 days Follow up in 2 weeks if no improvement

## 2012-05-07 ENCOUNTER — Other Ambulatory Visit: Payer: Self-pay | Admitting: Cardiology

## 2012-05-12 ENCOUNTER — Other Ambulatory Visit: Payer: Self-pay | Admitting: *Deleted

## 2012-05-12 MED ORDER — METOPROLOL SUCCINATE ER 50 MG PO TB24: 50.0000 mg | ORAL_TABLET | Freq: Every day | ORAL | Status: DC

## 2012-07-19 ENCOUNTER — Other Ambulatory Visit: Payer: Self-pay | Admitting: Ophthalmology

## 2012-07-19 ENCOUNTER — Other Ambulatory Visit: Payer: Self-pay | Admitting: Cardiology

## 2012-07-20 ENCOUNTER — Other Ambulatory Visit: Payer: Self-pay | Admitting: Cardiology

## 2012-07-22 ENCOUNTER — Other Ambulatory Visit: Payer: Self-pay | Admitting: *Deleted

## 2012-07-22 DIAGNOSIS — E118 Type 2 diabetes mellitus with unspecified complications: Secondary | ICD-10-CM

## 2012-07-22 DIAGNOSIS — I10 Essential (primary) hypertension: Secondary | ICD-10-CM

## 2012-07-22 MED ORDER — LISINOPRIL 5 MG PO TABS: 5.0000 mg | ORAL_TABLET | Freq: Every day | ORAL | Status: DC

## 2012-08-03 ENCOUNTER — Other Ambulatory Visit: Payer: Self-pay | Admitting: *Deleted

## 2012-08-03 MED ORDER — OMEPRAZOLE 40 MG PO CPDR: DELAYED_RELEASE_CAPSULE | ORAL | Status: DC

## 2012-08-18 ENCOUNTER — Encounter: Payer: Self-pay | Admitting: Dietician

## 2012-09-16 ENCOUNTER — Other Ambulatory Visit: Payer: Self-pay

## 2012-09-29 ENCOUNTER — Other Ambulatory Visit: Payer: Self-pay | Admitting: *Deleted

## 2012-09-29 MED ORDER — METOPROLOL SUCCINATE ER 50 MG PO TB24: ORAL_TABLET | ORAL | Status: DC

## 2012-10-04 ENCOUNTER — Telehealth: Payer: Self-pay | Admitting: *Deleted

## 2012-10-04 NOTE — Telephone Encounter (Signed)
Pt called again, left message to call her.  No answer.

## 2012-10-04 NOTE — Telephone Encounter (Signed)
Return call to pt but no answer.  Message left

## 2012-10-19 ENCOUNTER — Other Ambulatory Visit: Payer: Self-pay | Admitting: *Deleted

## 2012-10-19 DIAGNOSIS — F329 Major depressive disorder, single episode, unspecified: Secondary | ICD-10-CM

## 2012-10-19 DIAGNOSIS — F411 Generalized anxiety disorder: Secondary | ICD-10-CM

## 2012-10-22 MED ORDER — SERTRALINE HCL 50 MG PO TABS: 150.0000 mg | ORAL_TABLET | Freq: Every day | ORAL | Status: DC

## 2012-11-19 ENCOUNTER — Ambulatory Visit (INDEPENDENT_AMBULATORY_CARE_PROVIDER_SITE_OTHER): Payer: Self-pay | Admitting: Internal Medicine

## 2012-11-19 ENCOUNTER — Encounter: Payer: Self-pay | Admitting: Internal Medicine

## 2012-11-19 VITALS — BP 103/73 | HR 90 | Temp 97.3°F | Ht 64.0 in | Wt 265.5 lb

## 2012-11-19 DIAGNOSIS — L408 Other psoriasis: Secondary | ICD-10-CM

## 2012-11-19 DIAGNOSIS — L600 Ingrowing nail: Secondary | ICD-10-CM

## 2012-11-19 DIAGNOSIS — Z23 Encounter for immunization: Secondary | ICD-10-CM

## 2012-11-19 DIAGNOSIS — E118 Type 2 diabetes mellitus with unspecified complications: Secondary | ICD-10-CM

## 2012-11-19 DIAGNOSIS — L409 Psoriasis, unspecified: Secondary | ICD-10-CM

## 2012-11-19 LAB — GLUCOSE, CAPILLARY: Glucose-Capillary: 139 mg/dL — ABNORMAL HIGH (ref 70–99)

## 2012-11-19 LAB — POCT GLYCOSYLATED HEMOGLOBIN (HGB A1C): Hemoglobin A1C: 7.3

## 2012-11-19 MED ORDER — HYDROCORTISONE VALERATE 0.2 % EX OINT: TOPICAL_OINTMENT | CUTANEOUS | Status: DC

## 2012-11-19 NOTE — Patient Instructions (Addendum)
Goals (1 Years of Data) as of 11/19/12   None

## 2012-11-19 NOTE — Progress Notes (Signed)
Subjective:   Patient ID: Kendra Adams female   DOB: 04-03-1960 52 y.o.   MRN: [id number]  HPI: [name] is a 52 y.o. woman with past medical history of hypertension, hyperlipidemia, diabetes, hypothyroidism and anxiety/depression who comes in for acute visit complaining of bilateral toe pain and worsening psoriasis on hands bilaterally. The patient states that she has been lost to dermatology followup and has run out of her cream therefore is having an outbreak that is immensely itchy and is occasionally opening and bleeding secondary to her scratching. The patient has not tried any over-the-counter creams or over-the-counter medications to help. The patient has denied any fever/chills, or any other new or suspicious rashes, nausea/vomiting/diarrhea, muscle weakness or burning or shooting pain in any of her extremities or joints, any new joint swelling.  The patient continues to smoke although the Chantix is helping. Stressors have included taking care of early Alzheimer/dementia on and a grandson who has antisocial attitude tendenciesthat are immensely difficult for the patient to handle.    Past Medical History  Diagnosis Date  . Carpal tunnel syndrome on both sides     right hand surgery 1998, left hand surgery 1999.  . Allergic rhinitis   . Asthma   . Depression   . GERD (gastroesophageal reflux disease)   . Hyperlipidemia   . Hypothyroidism   . Psoriasis   . CAD (coronary artery disease)     cath 12/13/10: mRCA 99%, EF 65-70%;  s/p BMS to mRCA  . HTN (hypertension)   . DM2 (diabetes mellitus, type 2)   . Hyperlipidemia   . MI (myocardial infarction)    Current Outpatient Prescriptions  Medication Sig Dispense Refill  . albuterol (PROVENTIL HFA;VENTOLIN HFA) 108 (90 BASE) MCG/ACT inhaler Inhale 2 puffs into the lungs every 6 (six) hours as needed. For asthma      . amoxicillin (AMOXIL) 875 MG tablet Take 1 tablet (875 mg total) by mouth 2 (two) times daily.  28 tablet  0   . aspirin 81 MG chewable tablet Chew 81 mg by mouth daily.      Marland Kitchen atorvastatin (LIPITOR) 80 MG tablet Take 80 mg by mouth at bedtime.      Marland Kitchen atorvastatin (LIPITOR) 80 MG tablet TAKE 1 TABLET BY MOUTH AT BEDTIME  30 tablet  5  . levothyroxine (SYNTHROID, LEVOTHROID) 150 MCG tablet TAKE 1 TABLET BY MOUTH DAILY  30 tablet  3  . lisinopril (PRINIVIL,ZESTRIL) 5 MG tablet Take 1 tablet (5 mg total) by mouth daily.  90 tablet  12  . metFORMIN (GLUCOPHAGE) 500 MG tablet TAKE 1 TABLET (500 MG TOTAL) BY MOUTH 2 (TWO) TIMES DAILY WITH A MEAL.  60 tablet  3  . metFORMIN (GLUCOPHAGE) 500 MG tablet Take 1 tablet (500 mg total) by mouth daily with breakfast.  30 tablet  3  . metoprolol succinate (TOPROL-XL) 50 MG 24 hr tablet Take one tablet by mouth daily. NO FURTHER REFILLS UNTIL SCHEDULES APPOINTMENT  15 tablet  0  . nitroGLYCERIN (NITROSTAT) 0.4 MG SL tablet Place 1 tablet (0.4 mg total) under the tongue every 5 (five) minutes as needed for chest pain.  30 tablet  1  . Omega-3 Fatty Acids (FISH OIL) 1000 MG CAPS Take 1 capsule by mouth daily.      Marland Kitchen omeprazole (PRILOSEC) 40 MG capsule TAKE 1 CAPSULE BY MOUTH TWICE DAILY  60 capsule  11  . sertraline (ZOLOFT) 50 MG tablet Take 3 tablets (150 mg total) by mouth  daily.  270 tablet  1  . varenicline (CHANTIX) 1 MG tablet Take 1 tablet (1 mg total) by mouth 2 (two) times daily.  30 tablet  2  . [DISCONTINUED] sodium chloride (OCEAN NASAL SPRAY) 0.65 % nasal spray 1 spray by Nasal route as needed for congestion.  45 mL  3   No current facility-administered medications for this visit.   Family History  Problem Relation Age of Onset  . Diabetes Mother   . Hypertension Mother   . Diabetes Father   . Hypertension Father    History   Social History  . Marital Status: Married    Spouse Name: N/A    Number of Children: 0  . Years of Education: 10th grade   Occupational History  . Conservation officer, nature     self-employed  . 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 Smoking status: Former Smoker -- 35 years    Types: Cigarettes  . Smokeless tobacco: Current User    Types: Chew     Comment: Quit x 1 week.  . Alcohol Use: No  . Drug Use: No  . Sexual Activity: None   Other Topics Concern  . None   Social History Narrative   Father died of suicide (gunshot) in 05/18/96. 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       Patient lives with her husband, her husband's niece Tammy who is mentally retarded and her best friend and the friend's daughter and 6 dogs               Review of Systems: otherwise negative unless listed in HPI  Objective:  Physical Exam: Filed Vitals:   11/19/12 1341  BP: 103/73  Pulse: 90  Temp: 97.3 F (36.3 C)  TempSrc: Oral  Height: 5' 4"  (1.626 m)  Weight: 265 lb 8 oz (120.43 kg)  SpO2: 95%   General: sitting in chair, NAD HEENT: PERRL, EOMI, no scleral icterus Cardiac: RRR, no rubs, murmurs or gallops Pulm: clear to auscultation bilaterally, moving normal volumes of air Abd: soft, nontender, nondistended, BS present Ext: warm and well perfused, no pedal edema, 1st and 2nd digit toes of right and left feet tender to palpation slightly swollen toenails curved medially into skin unable to lift free, bilateral hands with multiple excoriations and silver/gray plaques on the soles of both hands Neuro: alert and oriented X3, cranial nerves II-XII grossly intact   Assessment & Plan:  1. Ingrown toe nails: Pt has bilateral ingrown toe nails of 1st and 2nd foot digits w/o infection.  -referral to podiatry once approved for orange card  2. Psoriasis: Patient has been lost to dermatology followup given lack of insurance and is used to having appointment/steroid creams that helped with plaque breakouts on hands. -Hydrocortisone ointment -Referral to dermatology when patient is approved for orange card  Pt  discussed with Dr. Ellwood Dense.

## 2012-11-22 ENCOUNTER — Other Ambulatory Visit: Payer: Self-pay

## 2012-11-22 NOTE — Progress Notes (Signed)
Case discussed with Dr. Sadek soon after the resident saw the patient.  We reviewed the resident's history and exam and pertinent patient test results.  I agree with the assessment, diagnosis, and plan of care documented in the resident's note. 

## 2012-11-23 ENCOUNTER — Other Ambulatory Visit: Payer: Self-pay

## 2012-11-23 MED ORDER — ATORVASTATIN CALCIUM 80 MG PO TABS: ORAL_TABLET | ORAL | Status: DC

## 2012-11-24 ENCOUNTER — Other Ambulatory Visit: Payer: Self-pay | Admitting: *Deleted

## 2012-11-24 NOTE — Telephone Encounter (Signed)
Last TSH 7/13

## 2012-11-25 MED ORDER — LEVOTHYROXINE SODIUM 150 MCG PO TABS: ORAL_TABLET | ORAL | Status: DC

## 2012-12-09 ENCOUNTER — Emergency Department (HOSPITAL_COMMUNITY)
Admission: EM | Admit: 2012-12-09 | Discharge: 2012-12-09 | Disposition: A | Discharge status: Home / Self Care | Payer: Self-pay | Attending: Emergency Medicine | Admitting: Emergency Medicine

## 2012-12-09 ENCOUNTER — Encounter (HOSPITAL_COMMUNITY): Payer: Self-pay | Admitting: *Deleted

## 2012-12-09 DIAGNOSIS — F3289 Other specified depressive episodes: Secondary | ICD-10-CM | POA: Insufficient documentation

## 2012-12-09 DIAGNOSIS — S59919A Unspecified injury of unspecified forearm, initial encounter: Secondary | ICD-10-CM | POA: Insufficient documentation

## 2012-12-09 DIAGNOSIS — S59909A Unspecified injury of unspecified elbow, initial encounter: Secondary | ICD-10-CM | POA: Insufficient documentation

## 2012-12-09 DIAGNOSIS — E119 Type 2 diabetes mellitus without complications: Secondary | ICD-10-CM | POA: Insufficient documentation

## 2012-12-09 DIAGNOSIS — F329 Major depressive disorder, single episode, unspecified: Secondary | ICD-10-CM | POA: Insufficient documentation

## 2012-12-09 DIAGNOSIS — Y939 Activity, unspecified: Secondary | ICD-10-CM | POA: Insufficient documentation

## 2012-12-09 DIAGNOSIS — Y9289 Other specified places as the place of occurrence of the external cause: Secondary | ICD-10-CM | POA: Insufficient documentation

## 2012-12-09 DIAGNOSIS — E785 Hyperlipidemia, unspecified: Secondary | ICD-10-CM | POA: Insufficient documentation

## 2012-12-09 DIAGNOSIS — Z9861 Coronary angioplasty status: Secondary | ICD-10-CM | POA: Insufficient documentation

## 2012-12-09 DIAGNOSIS — S4980XA Other specified injuries of shoulder and upper arm, unspecified arm, initial encounter: Secondary | ICD-10-CM | POA: Insufficient documentation

## 2012-12-09 DIAGNOSIS — F17200 Nicotine dependence, unspecified, uncomplicated: Secondary | ICD-10-CM | POA: Insufficient documentation

## 2012-12-09 DIAGNOSIS — I252 Old myocardial infarction: Secondary | ICD-10-CM | POA: Insufficient documentation

## 2012-12-09 DIAGNOSIS — M25512 Pain in left shoulder: Secondary | ICD-10-CM

## 2012-12-09 DIAGNOSIS — Z79899 Other long term (current) drug therapy: Secondary | ICD-10-CM | POA: Insufficient documentation

## 2012-12-09 DIAGNOSIS — Z8669 Personal history of other diseases of the nervous system and sense organs: Secondary | ICD-10-CM | POA: Insufficient documentation

## 2012-12-09 DIAGNOSIS — M25522 Pain in left elbow: Secondary | ICD-10-CM

## 2012-12-09 DIAGNOSIS — I2510 Atherosclerotic heart disease of native coronary artery without angina pectoris: Secondary | ICD-10-CM | POA: Insufficient documentation

## 2012-12-09 DIAGNOSIS — K219 Gastro-esophageal reflux disease without esophagitis: Secondary | ICD-10-CM | POA: Insufficient documentation

## 2012-12-09 DIAGNOSIS — I10 Essential (primary) hypertension: Secondary | ICD-10-CM | POA: Insufficient documentation

## 2012-12-09 DIAGNOSIS — Z872 Personal history of diseases of the skin and subcutaneous tissue: Secondary | ICD-10-CM | POA: Insufficient documentation

## 2012-12-09 DIAGNOSIS — J45909 Unspecified asthma, uncomplicated: Secondary | ICD-10-CM | POA: Insufficient documentation

## 2012-12-09 DIAGNOSIS — S6990XA Unspecified injury of unspecified wrist, hand and finger(s), initial encounter: Secondary | ICD-10-CM | POA: Insufficient documentation

## 2012-12-09 DIAGNOSIS — E039 Hypothyroidism, unspecified: Secondary | ICD-10-CM | POA: Insufficient documentation

## 2012-12-09 DIAGNOSIS — W010XXA Fall on same level from slipping, tripping and stumbling without subsequent striking against object, initial encounter: Secondary | ICD-10-CM | POA: Insufficient documentation

## 2012-12-09 DIAGNOSIS — S46909A Unspecified injury of unspecified muscle, fascia and tendon at shoulder and upper arm level, unspecified arm, initial encounter: Secondary | ICD-10-CM | POA: Insufficient documentation

## 2012-12-09 NOTE — ED Notes (Signed)
Pt slipped on stairs and fell on her L elbow and L shoulder.  Denies loc or neck pain.  Pain increases with extension.  No swelling or deformity noted at this time.

## 2012-12-09 NOTE — ED Provider Notes (Signed)
CSN: [id number]     Arrival date & time 12/09/12  3267 History  This chart was scribed for non-physician practitioner Abigail Butts, PA-C working with Houston Siren, MD by Rolanda Lundborg, ED Scribe. This patient was seen in room TR09C/TR09C and the patient's care was started at 7:27 PM.   Chief Complaint  Patient presents with  . Elbow Pain   The history is provided by the patient. No language interpreter was used.   HPI Comments: Kendra Adams is a 52 y.o. female who presents to the Emergency Department complaining of left shoulder pain after stumbling and falling down three stairs and landing on her left elbow. She states the pain is made worse by lifting her arm, but nothing else makes the pain worse.  She has not attempted to take any OTC medications and rates the pain at minimal. She denies pain with ROM.  She denies hitting her head and LOC. She also denies neck pain and back pain. She denies any prior injuries in her left arm. Pt cannot take ibuprofen.  PCP- Dr. Clinton Gallant  Past Medical History  Diagnosis Date  . Carpal tunnel syndrome on both sides     right hand surgery 1998, left hand surgery 1999.  . Allergic rhinitis   . Asthma   . Depression   . GERD (gastroesophageal reflux disease)   . Hyperlipidemia   . Hypothyroidism   . Psoriasis   . CAD (coronary artery disease)     cath 12/13/10: mRCA 99%, EF 65-70%;  s/p BMS to mRCA  . HTN (hypertension)   . DM2 (diabetes mellitus, type 2)   . Hyperlipidemia   . MI (myocardial infarction)    Past Surgical History  Procedure Laterality Date  . Coronary angioplasty with stent placement    . Carpal tunnel release      w/ bone spurs  . Dilation and curettage of uterus     Family History  Problem Relation Age of Onset  . Diabetes Mother   . Hypertension Mother   . Diabetes Father   . Hypertension Father    History  Substance Use Topics  . Smoking status: Current Every Day Smoker -- 0.15 packs/day for 35 years    Types: Cigarettes  . Smokeless tobacco: Current User     Comment: Quit x 1 week.  . Alcohol Use: No   OB History   Grav Para Term Preterm Abortions TAB SAB Ect Mult Living                 Review of Systems  Constitutional: Negative for fever, diaphoresis, appetite change, fatigue and unexpected weight change.  HENT: Negative for mouth sores and neck stiffness.   Eyes: Negative for visual disturbance.  Respiratory: Negative for cough, chest tightness, shortness of breath and wheezing.   Cardiovascular: Negative for chest pain.  Gastrointestinal: Negative for nausea, vomiting, abdominal pain, diarrhea and constipation.  Endocrine: Negative for polydipsia, polyphagia and polyuria.  Genitourinary: Negative for dysuria, urgency, frequency and hematuria.  Musculoskeletal: Positive for arthralgias. Negative for back pain.  Skin: Negative for rash.  Allergic/Immunologic: Negative for immunocompromised state.  Neurological: Negative for syncope, light-headedness and headaches.  Hematological: Does not bruise/bleed easily.  Psychiatric/Behavioral: Negative for sleep disturbance. The patient is not nervous/anxious.     Allergies  Nsaids and Tramadol hcl  Home Medications   Current Outpatient Rx  Name  Route  Sig  Dispense  Refill  . albuterol (PROVENTIL HFA;VENTOLIN HFA) 108 (90 BASE) MCG/ACT  inhaler   Inhalation   Inhale 2 puffs into the lungs every 6 (six) hours as needed. For asthma         . amoxicillin (AMOXIL) 875 MG tablet   Oral   Take 1 tablet (875 mg total) by mouth 2 (two) times daily.   28 tablet   0   . aspirin 81 MG chewable tablet   Oral   Chew 81 mg by mouth daily.         Marland Kitchen EXPIRED: atorvastatin (LIPITOR) 80 MG tablet   Oral   Take 80 mg by mouth at bedtime.         Marland Kitchen atorvastatin (LIPITOR) 80 MG tablet      TAKE 1 TABLET BY MOUTH AT BEDTIME   30 tablet   2     .Marland KitchenPatient needs to contact office to schedule  App ...   . hydrocortisone valerate  ointment (WESTCORT) 0.2 %      Apply to affected area daily   45 g   1   . levothyroxine (SYNTHROID, LEVOTHROID) 150 MCG tablet      TAKE 1 TABLET BY MOUTH DAILY   30 tablet   3     Rx has expired - unused refills remain   . lisinopril (PRINIVIL,ZESTRIL) 5 MG tablet   Oral   Take 1 tablet (5 mg total) by mouth daily.   90 tablet   12   . metFORMIN (GLUCOPHAGE) 500 MG tablet      TAKE 1 TABLET (500 MG TOTAL) BY MOUTH 2 (TWO) TIMES DAILY WITH A MEAL.   60 tablet   3     No refills available   . metFORMIN (GLUCOPHAGE) 500 MG tablet   Oral   Take 1 tablet (500 mg total) by mouth daily with breakfast.   30 tablet   3   . metoprolol succinate (TOPROL-XL) 50 MG 24 hr tablet      Take one tablet by mouth daily. NO FURTHER REFILLS UNTIL SCHEDULES APPOINTMENT   15 tablet   0     Patient needs to schedule follow up   . nitroGLYCERIN (NITROSTAT) 0.4 MG SL tablet   Sublingual   Place 1 tablet (0.4 mg total) under the tongue every 5 (five) minutes as needed for chest pain.   30 tablet   1   . Omega-3 Fatty Acids (FISH OIL) 1000 MG CAPS   Oral   Take 1 capsule by mouth daily.         Marland Kitchen omeprazole (PRILOSEC) 40 MG capsule      TAKE 1 CAPSULE BY MOUTH TWICE DAILY   60 capsule   11     No refills available   . sertraline (ZOLOFT) 50 MG tablet   Oral   Take 3 tablets (150 mg total) by mouth daily.   270 tablet   1   . varenicline (CHANTIX) 1 MG tablet   Oral   Take 1 tablet (1 mg total) by mouth 2 (two) times daily.   30 tablet   2    BP 112/79  Pulse 100  Temp(Src) 98.8 F (37.1 C) (Oral)  Resp 20  SpO2 97% Physical Exam  Nursing note and vitals reviewed. Constitutional: She is oriented to person, place, and time. She appears well-developed and well-nourished. No distress.  HENT:  Head: Normocephalic and atraumatic.  Eyes: Conjunctivae are normal.  Neck: Normal range of motion and full passive range of motion without pain. Neck supple. No spinous  process tenderness and no muscular tenderness present. No rigidity. Normal range of motion present.  Cardiovascular: Normal rate, regular rhythm, S1 normal, S2 normal, normal heart sounds and intact distal pulses.   No murmur heard. Pulses:      Radial pulses are 2+ on the right side, and 2+ on the left side.       Dorsalis pedis pulses are 2+ on the right side, and 2+ on the left side.       Posterior tibial pulses are 2+ on the right side, and 2+ on the left side.  Capillary refill less than 3 seconds  Pulmonary/Chest: Effort normal and breath sounds normal. No respiratory distress. She has no decreased breath sounds. She has no wheezes. She has no rhonchi. She exhibits no tenderness, no bony tenderness, no crepitus, no deformity and no retraction.  No crepitus. No pain to palpation of the ribs. No tachypnea, no hypoxia, no accessory muscle use, and no difficulty breathing.  Abdominal: Soft. Bowel sounds are normal.  Musculoskeletal: She exhibits tenderness. She exhibits no edema.       Left shoulder: Normal. She exhibits normal range of motion, no tenderness, no bony tenderness, no swelling, no effusion, no crepitus, no deformity, no laceration, no pain, no spasm, normal pulse and normal strength.       Left elbow: Normal. She exhibits normal range of motion, no swelling, no effusion, no deformity and no laceration. No tenderness found. No radial head, no medial epicondyle, no lateral epicondyle and no olecranon process tenderness noted.       Left wrist: She exhibits normal range of motion, no tenderness, no bony tenderness, no swelling, no effusion, no crepitus, no deformity and no laceration.       Cervical back: Normal.       Thoracic back: Normal.       Lumbar back: Normal.  ROM: full rom without pain of all joints of the LUE without pain. No tenderness to palpation of any joints  Neurological: She is alert and oriented to person, place, and time. Coordination normal.  Reflex Scores:       Tricep reflexes are 2+ on the right side and 2+ on the left side.      Bicep reflexes are 2+ on the right side and 2+ on the left side.      Brachioradialis reflexes are 2+ on the right side and 2+ on the left side.      Patellar reflexes are 2+ on the right side and 2+ on the left side.      Achilles reflexes are 2+ on the right side and 2+ on the left side. Strength 5/5. All major joints of the left extremtiy: sensation is intact.   Skin: Skin is warm and dry. No rash noted. She is not diaphoretic. No erythema.  No tenting of the skin. No echymosis or contusion noted to the LUE or chest wall. No abrasions or lacerations  Psychiatric: She has a normal mood and affect.    ED Course  Procedures (including critical care time) Medications - No data to display  DIAGNOSTIC STUDIES: Oxygen Saturation is 97% on room air, normal by my interpretation.    COORDINATION OF CARE: 7:40 PM- Discussed treatment plan with pt which includes treatment with tylenol and pt agrees to plan.    Labs Review Labs Reviewed - No data to display Imaging Review No results found.  MDM   1. Left elbow pain   2. Shoulder pain, left  Wilburt Finlay presents with extremity pain after a fall, but full ROM with minimal to no pain.  NO pain to palpation of any of the joint spaces or lateral ribs.  No clinical evidence of fracture or dislocation. No imaging indicated at this time. Pain managed in ED. Pt advised to follow up with orthopedics if symptoms persist for possibility of missed fracture diagnosis. Patient given conservative therapy recommended and discussed. Patient will be dc home & is agreeable with above plan.  It has been determined that no acute conditions requiring further emergency intervention are present at this time. The patient/guardian have been advised of the diagnosis and plan. We have discussed signs and symptoms that warrant return to the ED, such as changes or worsening in symptoms.   Vital  signs are stable at discharge.   BP 117/79  Pulse 94  Temp(Src) 98.8 F (37.1 C) (Oral)  Resp 16  SpO2 99%  Patient/guardian has voiced understanding and agreed to follow-up with the PCP or specialist.    I personally performed the services described in this documentation, which was scribed in my presence. The recorded information has been reviewed and is accurate.    Jarrett Soho Jasmine Maceachern, PA-C 12/10/12 [phone]

## 2012-12-10 NOTE — ED Provider Notes (Signed)
Medical screening examination/treatment/procedure(s) were performed by non-physician practitioner and as supervising physician I was immediately available for consultation/collaboration.    Houston Siren, MD 12/10/12 1057

## 2012-12-16 ENCOUNTER — Encounter: Payer: Self-pay | Admitting: Cardiology

## 2012-12-17 ENCOUNTER — Ambulatory Visit (INDEPENDENT_AMBULATORY_CARE_PROVIDER_SITE_OTHER): Payer: Self-pay | Admitting: Internal Medicine

## 2012-12-17 VITALS — BP 115/77 | HR 104 | Temp 97.5°F | Wt 259.9 lb

## 2012-12-17 DIAGNOSIS — E118 Type 2 diabetes mellitus with unspecified complications: Secondary | ICD-10-CM

## 2012-12-17 DIAGNOSIS — M67919 Unspecified disorder of synovium and tendon, unspecified shoulder: Secondary | ICD-10-CM

## 2012-12-17 DIAGNOSIS — M7591 Shoulder lesion, unspecified, right shoulder: Secondary | ICD-10-CM

## 2012-12-17 DIAGNOSIS — M7590 Shoulder lesion, unspecified, unspecified shoulder: Secondary | ICD-10-CM | POA: Insufficient documentation

## 2012-12-17 LAB — GLUCOSE, CAPILLARY: Glucose-Capillary: 190 mg/dL — ABNORMAL HIGH (ref 70–99)

## 2012-12-17 NOTE — Patient Instructions (Signed)
Rotator Cuff Tendonitis  The rotator cuff is the collection of all the muscles and tendons (the supraspinatus, infraspinatus, subscapularis, and teres minor muscles and their tendons) that help your shoulder stay in place. This unit holds the head of the upper arm bone (humerus) in the cup (fossa) of the shoulder blade (scapula). Basically, it connects the arm to the shoulder. Tendinitis is a swelling and irritation of the tissue, called cord like structures (tendons) that connect muscle to bone. It usually is caused by overusing the joint involved. When the tissue surrounding a tendon (the synovium) becomes inflamed, it is called tenosynovitis. This also is often the result of overuse in people whose jobs require repetitive (over and over again) types of motion. HOME CARE INSTRUCTIONS   Use a sling or splint for as long as directed by your caregiver until the pain decreases.  Apply ice to the injury for 15-20 minutes, 3-4 times per day. Put the ice in a plastic bag and place a towel between the bag of ice and your skin.  Try to avoid use other than gentle range of motion while your shoulder is painful. Use and exercise only as directed by your caregiver. Stop exercises or range of motion if pain or discomfort increases, unless directed otherwise by your caregiver.  Only take over-the-counter or prescription medicines for pain, discomfort, or fever as directed by your caregiver.  If you were give a shoulder sling and straps (immobilizer), do not remove it except as directed, or until you see a caregiver for a follow-up examination. If you need to remove it, move your arm as little as possible or as directed.  You may want to sleep on several pillows at night to lessen swelling and pain. SEEK IMMEDIATE MEDICAL CARE IF:   Pain in your shoulder increases or new pain develops in your arm, hand, or fingers and is not relieved with medications.  You develop new, unexplained symptoms, especially  increased numbness in the hands or loss of strength, or you develop any worsening of the problems which brought you in for care.  Your arm, hand, or fingers are numb or tingling.  Your arm, hand, or fingers are swollen, painful, or turn white or blue. Document Released: 05/17/2003 Document Revised: 05/19/2011 Document Reviewed: 12/23/2007 [facility] Patient Information 2014 [place].

## 2012-12-18 ENCOUNTER — Encounter: Payer: Self-pay | Admitting: Internal Medicine

## 2012-12-18 MED ORDER — NAPROXEN 375 MG PO TABS: 375.0000 mg | ORAL_TABLET | Freq: Two times a day (BID) | ORAL | Status: DC

## 2012-12-18 NOTE — Progress Notes (Signed)
  Subjective:    Patient ID: Kendra Adams, female    DOB: [birth year], 52 y.o.   MRN: [id number]  Shoulder Pain  The pain is present in the right shoulder. This is a new problem. The current episode started in the past 7 days. There has been no history of extremity trauma. The problem occurs intermittently. The problem has been gradually improving. The quality of the pain is described as aching. The pain is at a severity of 4/10. The pain is mild. Associated symptoms include stiffness. Pertinent negatives include no fever, inability to bear weight, itching, joint locking, joint swelling, limited range of motion, numbness or tingling. The symptoms are aggravated by contact. She has tried cold, heat and rest for the symptoms. The treatment provided mild relief. Family history does not include gout or rheumatoid arthritis. Her past medical history is significant for diabetes.      Review of Systems  Constitutional: Negative for fever.  Musculoskeletal: Positive for stiffness. Negative for neck stiffness.  Skin: Negative for itching.  Neurological: Negative for tingling, weakness and numbness.       Objective:   Physical Exam  Constitutional: She appears well-developed.  Neck: Normal range of motion.  Cardiovascular: Normal rate and regular rhythm.   Pulmonary/Chest: Breath sounds normal.  Musculoskeletal: Normal range of motion. She exhibits tenderness (over supraspinatus insertion site). She exhibits no edema.       Right shoulder: She exhibits no effusion, no crepitus, no spasm and normal strength.  Pt with + empty can sign, no atrophy or muscle wasting of shoulders bilaterally, negative Apley scratch test/ Neers test/ Hawkins test and cross arm test  Neurological: She has normal reflexes. No cranial nerve deficit. She exhibits normal muscle tone.  Skin: Skin is warm. No rash noted. No erythema.          Assessment & Plan:  See problem based charting.   Pt discussed with Dr.  Lynnae January

## 2012-12-18 NOTE — Assessment & Plan Note (Signed)
Pt has good FROM of shoulder with only +empty can test and point tenderness over insertion of supraspinatus. Pain relieved with heat and massage. Pt has not been taking any OTC meds for relief.  -naproxen  -continuation of heat/ice/massage

## 2012-12-27 ENCOUNTER — Other Ambulatory Visit: Payer: Self-pay | Admitting: Internal Medicine

## 2012-12-27 ENCOUNTER — Other Ambulatory Visit: Payer: Self-pay | Admitting: *Deleted

## 2012-12-27 MED ORDER — METFORMIN HCL 500 MG PO TABS: ORAL_TABLET | ORAL | Status: DC

## 2012-12-27 NOTE — Telephone Encounter (Signed)
Should pt be taking Metformin 0nce or twice daily?  There's 2 different rxs on med list.  Thanks

## 2012-12-27 NOTE — Progress Notes (Signed)
Case discussed with Dr. Sadek soon after the resident saw the patient.  We reviewed the resident's history and exam and pertinent patient test results.  I agree with the assessment, diagnosis, and plan of care documented in the resident's note. 

## 2013-01-06 ENCOUNTER — Telehealth: Payer: Self-pay | Admitting: *Deleted

## 2013-01-06 NOTE — Telephone Encounter (Signed)
C/o L foot, ankle burning and painful, slightly swollen, temp slightly warmer to touch than R. Denies any accidents, insect bites. Can't stand to touch area appt given at pt's request fri 10/31 at 1430, will arrive early and ask for wh/ch. She will go to ED or urg care if pain becomes too much

## 2013-01-06 NOTE — Telephone Encounter (Signed)
I will see her when she comes.

## 2013-01-06 NOTE — Telephone Encounter (Signed)
Pt calls and leaves a message stating that she has done something to her foot, causing her have great pain, attempted to rtc, no answer, left message

## 2013-01-07 ENCOUNTER — Ambulatory Visit (INDEPENDENT_AMBULATORY_CARE_PROVIDER_SITE_OTHER): Payer: Self-pay | Admitting: Internal Medicine

## 2013-01-07 ENCOUNTER — Encounter: Payer: Self-pay | Admitting: Dietician

## 2013-01-07 ENCOUNTER — Encounter: Payer: Self-pay | Admitting: Internal Medicine

## 2013-01-07 VITALS — BP 114/79 | HR 78 | Temp 98.1°F | Ht 63.25 in | Wt 260.9 lb

## 2013-01-07 DIAGNOSIS — M7591 Shoulder lesion, unspecified, right shoulder: Secondary | ICD-10-CM

## 2013-01-07 DIAGNOSIS — M79672 Pain in left foot: Secondary | ICD-10-CM

## 2013-01-07 DIAGNOSIS — Z23 Encounter for immunization: Secondary | ICD-10-CM

## 2013-01-07 DIAGNOSIS — I10 Essential (primary) hypertension: Secondary | ICD-10-CM

## 2013-01-07 DIAGNOSIS — M79673 Pain in unspecified foot: Secondary | ICD-10-CM | POA: Insufficient documentation

## 2013-01-07 DIAGNOSIS — R300 Dysuria: Secondary | ICD-10-CM

## 2013-01-07 DIAGNOSIS — E118 Type 2 diabetes mellitus with unspecified complications: Secondary | ICD-10-CM

## 2013-01-07 DIAGNOSIS — M79609 Pain in unspecified limb: Secondary | ICD-10-CM

## 2013-01-07 DIAGNOSIS — E785 Hyperlipidemia, unspecified: Secondary | ICD-10-CM

## 2013-01-07 DIAGNOSIS — N390 Urinary tract infection, site not specified: Secondary | ICD-10-CM | POA: Insufficient documentation

## 2013-01-07 LAB — LIPID PANEL
Cholesterol: 157 mg/dL (ref 0–200)
HDL: 33 mg/dL — ABNORMAL LOW (ref >39)
LDL Cholesterol: 82 mg/dL (ref 0–99)
Total CHOL/HDL Ratio: 4.8 Ratio
Triglycerides: 211 mg/dL — ABNORMAL HIGH (ref <150)
VLDL: 42 mg/dL — ABNORMAL HIGH (ref 0–40)

## 2013-01-07 LAB — BASIC METABOLIC PANEL WITH GFR
BUN: 13 mg/dL (ref 6–23)
CO2: 30 mEq/L (ref 19–32)
Calcium: 9.4 mg/dL (ref 8.4–10.5)
Chloride: 103 mEq/L (ref 96–112)
Creat: 0.79 mg/dL (ref 0.50–1.10)
GFR, Est African American: >89 mL/min
GFR, Est Non African American: 86 mL/min
Glucose, Bld: 109 mg/dL — ABNORMAL HIGH (ref 70–99)
Potassium: 4.2 mEq/L (ref 3.5–5.3)
Sodium: 139 mEq/L (ref 135–145)

## 2013-01-07 LAB — HM DIABETES EYE EXAM

## 2013-01-07 MED ORDER — NAPROXEN 500 MG PO TABS: 500.0000 mg | ORAL_TABLET | Freq: Two times a day (BID) | ORAL | Status: DC

## 2013-01-07 MED ORDER — PNEUMOCOCCAL VAC POLYVALENT 25 MCG/0.5ML IJ INJ: 0.5000 mL | INJECTION | Freq: Once | INTRAMUSCULAR | Status: DC

## 2013-01-07 NOTE — Assessment & Plan Note (Addendum)
Assessment: Most like diagnosis: MT stress fracture in view of clinical history and physical examination findings. 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 Pain has not responded to current dose of Naprosyn.  Ddx: Ankle joint arthritis, cuboid pain syndrome or even neuropathic pain.     Plan: 1. Labs/imaging: discussed with the patient about cost and benefit for imaging of the foot. An MRI would be the most sensitive test for a stress fracture. Plain x-rays is another option, but may not be able to detect a stress fracture due to poor sensitivity. Patient opted not to pursue imaging at this time. 2. Therapy: trial of Increased dose of naproxen from 375 mg twice a day to 500 mg twice a day. Will try for the next 4 weeks. Encouraged the patient also to rest her left foot and avoid excessive use. Use heating pads, ice and massage 3. Follow up: 4 weeks with PCP

## 2013-01-07 NOTE — Progress Notes (Signed)
Patient ID: Kendra Adams, female   DOB: 1961-01-13, 52 y.o.   MRN: [id number]   Subjective:   HPI: Kendra Adams is a 52 y.o. woman with past medical history of depression, diabetes, obesity, CAD, and other medical problems presents to the clinic with left foot pain.  Left foot pain: Patient presents for presents evaluation of left foot pain. Location: left lateral dorsal foot. Pain reaches intensity of 5-6/10. Symptoms have been present for 1 year  and include shooting and dull pain . Initial inciting event: none, no hx of trauma to her foot. She reports spending a lot of time on her feet taking care of her animals and her husband. She does not wear high heeled shoes. Symptoms are intermittent. Alleviating factors identifiable by patient are recumbency. Exacerbating factors identifiable by patient are standing for a long time of. She sometime has to use a wheelchair when doing shopping. No treatments so far initiated by patient: She takes naproxen 375 mg twice a day, when necessary for her arthritis. This has not helped her foot pain and she feels the pain is becoming worse. No prev feet problems. Previous workup: none. Previous treatments: none. No history of fevers, chills, rigors, increased fatigue, nausea, or vomiting.   Dysuria: Patient reports that she has been experiencing increasing episodes of dysuria over the last 1 week. No abdominal pain, right flank pain, nausea, vomiting, or change in bowel movements. No hematuria, or incontinence. She is concerned about her possibly to a urine tract infection.    Kindly see the A&P for the status of the pt's chronic medical problems.   Past Medical History  Diagnosis Date  . Carpal tunnel syndrome on both sides     right hand surgery 1998, left hand surgery 1999.  . Allergic rhinitis   . Asthma   . Depression   . GERD (gastroesophageal reflux disease)   . Hyperlipidemia   . Hypothyroidism   . Psoriasis   . CAD (coronary artery disease)     cath 12/13/10: mRCA 99%, EF 65-70%;  s/p BMS to mRCA  . HTN (hypertension)   . DM2 (diabetes mellitus, type 2)   . Hyperlipidemia   . MI (myocardial infarction)    Current Outpatient Prescriptions  Medication Sig Dispense Refill  . albuterol (PROVENTIL HFA;VENTOLIN HFA) 108 (90 BASE) MCG/ACT inhaler Inhale 2 puffs into the lungs every 6 (six) hours as needed. For asthma      . aspirin 81 MG chewable tablet Chew 81 mg by mouth daily.      Marland Kitchen atorvastatin (LIPITOR) 80 MG tablet TAKE 1 TABLET BY MOUTH AT BEDTIME  30 tablet  2  . hydrocortisone valerate ointment (WESTCORT) 0.2 % Apply to affected area daily  45 g  1  . levothyroxine (SYNTHROID, LEVOTHROID) 150 MCG tablet TAKE 1 TABLET BY MOUTH DAILY  30 tablet  3  . lisinopril (PRINIVIL,ZESTRIL) 5 MG tablet Take 1 tablet (5 mg total) by mouth daily.  90 tablet  12  . metFORMIN (GLUCOPHAGE) 500 MG tablet TAKE 1 TABLET (500 MG TOTAL) BY MOUTH 2 (TWO) TIMES DAILY WITH A MEAL.  60 tablet  3  . metoprolol succinate (TOPROL-XL) 50 MG 24 hr tablet Take one tablet by mouth daily. NO FURTHER REFILLS UNTIL SCHEDULES APPOINTMENT  15 tablet  0  . naproxen (NAPROSYN) 500 MG tablet Take 1 tablet (500 mg total) by mouth 2 (two) times daily with a meal.  60 tablet  2  . nitroGLYCERIN (NITROSTAT) 0.4 MG  SL tablet Place 1 tablet (0.4 mg total) under the tongue every 5 (five) minutes as needed for chest pain.  30 tablet  1  . Omega-3 Fatty Acids (FISH OIL) 1000 MG CAPS Take 1 capsule by mouth daily.      Marland Kitchen omeprazole (PRILOSEC) 40 MG capsule TAKE 1 CAPSULE BY MOUTH TWICE DAILY  60 capsule  11  . sertraline (ZOLOFT) 50 MG tablet Take 3 tablets (150 mg total) by mouth daily.  270 tablet  1  . atorvastatin (LIPITOR) 80 MG tablet Take 80 mg by mouth at bedtime.      . [DISCONTINUED] sodium chloride (OCEAN NASAL SPRAY) 0.65 % nasal spray 1 spray by Nasal route as needed for congestion.  45 mL  3   Current Facility-Administered Medications  Medication Dose Route  Frequency Provider Last Rate Last Dose  . pneumococcal 23 valent vaccine (PNU-IMMUNE) injection 0.5 mL  0.5 mL Intramuscular Once Jessee Avers, MD       Family History  Problem Relation Age of Onset  . Diabetes Mother   . Hypertension Mother   . Diabetes Father   . Hypertension Father    History   Social History  . Marital Status: Married    Spouse Name: N/A    Number of Children: 0  . Years of Education: 10th grade   Occupational History  . Conservation officer, nature     self-employed  . 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 Smoking status: Former Smoker -- 0.15 packs/day for 35 years    Types: Cigarettes  . Smokeless tobacco: Current User     Comment: Quit x 1 week.  . Alcohol Use: No  . Drug Use: No  . Sexual Activity: None   Other Topics Concern  . None   Social History Narrative   Father died of suicide (gunshot) in 12-May-1996. 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       Patient lives with her husband, her husband's niece Tammy who is mentally retarded and her best friend and the friend's daughter and 6 dogs               Review of Systems: Constitutional: Denies fever, chills, diaphoresis, appetite change and fatigue.  Respiratory: Denies SOB, DOE, cough, chest tightness, and wheezing. Denies chest pain. Cardiovascular: No chest pain, palpitations and leg swelling.  Musculoskeletal: No myalgias, back pain, joint swelling, arthralgias  Psych: No depression symptoms. No SI or SA.   Objective:  Physical Exam: Filed Vitals:   01/07/13 1438 01/07/13 1518  BP: 149/80 114/79  Pulse: 80 78  Temp: 98.1 F (36.7 C)   TempSrc: Oral   Height: 5' 3.25" (1.607 m)   Weight: 260 lb 14.4 oz (118.343 kg)   SpO2: 96%    General: obesity. No acute distress. Appears comfortable. HEENT: Normal oral mucosa. MMM.  Lungs: CTA bilaterally. Heart: RRR; no extra sounds or  murmurs  Abdomen: Non-distended, normal BS, soft, nontender; no hepatosplenomegaly  Extremities: No pedal edema in lower extremities.  Left foot: Inspection appears normal without any open wounds, erythema, skin appears normal. Normal interdigital spaces. Ankle joint with good range of motion. Sensation is intact. Pulses are present and strong. There is a specific area of tenderness just distal to the lateral malleolus. The pain is reproducible on deep palpation.  Right foot  Exam - normal. Neurologic: Normal EOM,  Alert and oriented x3. No obvious neurologic/cranial nerve deficits.  Assessment & Plan:  I have discussed my assessment and plan  with  my attending in the clinic, Dr. Eppie Gibson  as detailed under problem based charting.

## 2013-01-07 NOTE — Patient Instructions (Signed)
Please take naproxen 500 mg two daily for 45 weeks and we see how you do We will check you blood and urine today. I will call you with results  Please comeback in 1 month Metatarsal Stress Fracture A stress fracture is a break in a bone of the body that is caused by repeated stress (trauma) that slowly weakens the bone until it eventually breaks. The metatarsal bones are in the middle of the feet, connecting the toes to the ankle. They are vulnerable to stress fractures. Metatarsal stress fractures are the second most common type of stress fracture in athletes. The metatarsal of the pointer toe (second metatarsal) is the most common metatarsal to suffer a stress fracture. SYMPTOMS   Vague, spread out pain or ache. Sometimes, tenderness and swelling in the foot.  Uncommonly, bleeding and bruising in the foot.   Weakness and inability to bear weight on the injured foot.  Paleness and deformity (sometimes). CAUSES  A stress fracture is caused by repeated trauma. This slowly weakens the bone, faster than it can heal itself, until the bone breaks. Stress fractures often follow a sudden change in training schedule. Stress fractures may be related to the loss of menstrual period in women.  RISK INCREASES WITH:   Previous stress fracture.  Sudden changes in training intensity, frequency, or duration Geophysical data processor recruits, distance runners).  Bony abnormalities (osteoporosis, tumors).  Metabolism disorders or hormone problems.  Nutrition deficiencies or eating disorders (anorexia or bulimia).  The loss of or irregular menstrual periods in women.  Poor strength and flexibility.  Running on hard surfaces. Poor leg and foot alignment. This includes flat feet.  Poor footwear with poor shock absorbers.  Poor running technique. PREVENTION   Warm up and stretch properly before activity.  Maintain physical fitness:  Muscle strength.  Endurance and flexibility.  Wear proper and correctly  fitted footwear. Replace shoes after 300 to 500 miles of running.  Learn and use proper technique with training and activity.  Increase activity and training gradually.  Treat hormonal disorders. Birth control pills can be helpful for women with menstrual period irregularity.  Correct metabolism and nutrition disorders.  Wear cushioned arch supports for runners with flat feet. PROGNOSIS  With proper treatment, stress fractures usually heal within 6 to 12 weeks. RELATED COMPLICATIONS   Failure to heal (nonunion), especially with stress fractures of the outer foot (upper part of the fifth metatarsal).  Healing in a poor position (malunion).  Recurring stress fracture.  Progression to a complete or displaced fracture.  Risks of surgery: infection, bleeding, injury to nerves (numbness, weakness, paralysis), and need for further surgery.  Repeated stress fracture, not necessarily at the same site. (Occurs in 1 of every 10 patients). TREATMENT Treatment first involves ice and medicine to reduce pain and inflammation. You must rest from any aggravating activity, to avoid making the fracture worse. For severe stress fractures, crutches may be advised, to take weight off the injured foot. Depending on your caregiver's instructions, you may be permitted to perform activities that do not cause pain. Any menstrual, hormonal, or nutritional problems must be addressed and treated. Return to activity must be performed gradually, to avoid reinjuring the foot. Physical therapy may be advised, to help strengthen the foot and regain full function. On rare occasions, surgery is needed. This may be offered if non-surgical treatment is ineffective after 3 to 6 months. MEDICATION   If pain medicine is needed, nonsteroidal anti-inflammatory medicines (NSAIDS) or other minor pain relievers are often  advised.  Do not take pain medicine for 7 days before surgery.  Only take over-the-counter or prescription  medicines for pain, discomfort, or fever as directed by your caregiver. SEEK IMMEDIATE MEDICAL CARE IF:  Symptoms get worse or do not improve in 2 weeks, despite treatment. The following occur after immobilization or surgery:  Swelling above or below the fracture site.  Severe, persistent pain.  Blue or gray skin below the fracture site, especially under the toenails. Numbness or loss of feeling below the fracture site.  New, unexplained symptoms develop. (Drugs used in treatment may produce side effects.) Document Released: 02/24/2005 Document Revised: 05/19/2011 Document Reviewed: 06/08/2008 [facility] Patient Information 2014 [place], [place].

## 2013-01-08 LAB — URINALYSIS, ROUTINE W REFLEX MICROSCOPIC
Bilirubin Urine: NEGATIVE
Glucose, UA: NEGATIVE mg/dL
Hgb urine dipstick: NEGATIVE
Ketones, ur: NEGATIVE mg/dL
Leukocytes, UA: NEGATIVE
Nitrite: NEGATIVE
Protein, ur: NEGATIVE mg/dL
Specific Gravity, Urine: 1.025 (ref 1.005–1.030)
Urobilinogen, UA: 0.2 mg/dL (ref 0.0–1.0)
pH: 6 (ref 5.0–8.0)

## 2013-01-08 NOTE — Assessment & Plan Note (Signed)
BP Readings from Last 3 Encounters:  01/07/13 114/79  12/17/12 115/77  12/09/12 117/79    Lab Results  Component Value Date   NA 139 01/07/2013   K 4.2 01/07/2013   CREATININE 0.79 01/07/2013    Assessment: Blood pressure control:  well controlled  Progress toward BP goal:    Comments: BP good.  Plan: Medications:  continue current medications Educational resources provided: brochure;handout;video Self management tools provided:   Other plans: f/u with PCP in 1 month

## 2013-01-08 NOTE — Assessment & Plan Note (Signed)
Assessment: Most like diagnosis UTI given dysuria. But no other symptoms and exam is normal.  U/A came back normal. DDx: interstitial cystitis.  Plan: 1. Labs/imaging: U/A >>normal  2. Therapy: encouraged pt to rehydrate 3. Follow up prn for persistence/worsening of symptoms.

## 2013-01-08 NOTE — Assessment & Plan Note (Signed)
Lab Results  Component Value Date   HGBA1C 7.3 11/19/2012   HGBA1C 6.8 04/08/2012   HGBA1C 6.6* 09/25/2011     Assessment: Diabetes control:   controlled  Progress toward A1C goal:    Comments: no change in meds   Plan: Medications:  continue current medications Home glucose monitoring: Frequency:   Timing:   Instruction/counseling given: reminded to bring blood glucose meter & log to each visit Educational resources provided: brochure;handout Self management tools provided:   Other plans: continue meds and follow up with PCP in 1 month.

## 2013-01-13 ENCOUNTER — Encounter: Payer: Self-pay | Admitting: Cardiology

## 2013-01-13 ENCOUNTER — Encounter (INDEPENDENT_AMBULATORY_CARE_PROVIDER_SITE_OTHER): Payer: Self-pay

## 2013-01-13 ENCOUNTER — Ambulatory Visit (INDEPENDENT_AMBULATORY_CARE_PROVIDER_SITE_OTHER): Payer: Self-pay | Admitting: Cardiology

## 2013-01-13 VITALS — BP 147/81 | HR 98 | Ht 64.0 in | Wt 257.0 lb

## 2013-01-13 DIAGNOSIS — R079 Chest pain, unspecified: Secondary | ICD-10-CM

## 2013-01-13 DIAGNOSIS — I10 Essential (primary) hypertension: Secondary | ICD-10-CM

## 2013-01-13 DIAGNOSIS — E118 Type 2 diabetes mellitus with unspecified complications: Secondary | ICD-10-CM

## 2013-01-13 MED ORDER — LISINOPRIL 10 MG PO TABS: 10.0000 mg | ORAL_TABLET | Freq: Every day | ORAL | Status: DC

## 2013-01-13 NOTE — Patient Instructions (Signed)
Your physician has requested that you have an exercise stress myoview. For further information please visit [email or web address]. Please follow instruction sheet, as given.  Your physician has recommended you make the following change in your medication: increase Lisinopril to 10 mg daily  Your physician recommends that you schedule a follow-up appointment in: after stress test

## 2013-01-13 NOTE — Progress Notes (Signed)
Patient ID: Kendra Adams, female   DOB: Oct 06, 1960, 52 y.o.   MRN: [id number]    Patient Name: Kendra Adams Date of Encounter: 01/13/2013  Primary Care Provider:  Clinton Gallant, MD Primary Cardiologist:  Kendra Adams, H  Patient Profile  Follow up after 2 years, chest pain  Problem List   Past Medical History  Diagnosis Date  . Carpal tunnel syndrome on both sides     right hand surgery 1998, left hand surgery 1999.  . Allergic rhinitis   . Asthma   . Depression   . GERD (gastroesophageal reflux disease)   . Hyperlipidemia   . Hypothyroidism   . Psoriasis   . CAD (coronary artery disease)     cath 12/13/10: mRCA 99%, EF 65-70%;  s/p BMS to mRCA  . HTN (hypertension)   . DM2 (diabetes mellitus, type 2)   . Hyperlipidemia   . MI (myocardial infarction)    Past Surgical History  Procedure Laterality Date  . Coronary angioplasty with stent placement    . Carpal tunnel release      w/ bone spurs  . Dilation and curettage of uterus      Allergies  Allergies  Allergen Reactions  . Nsaids     Rectal bleeds  . Tramadol Hcl Nausea Only    agitation    HPI  Kendra Adams comes in today for evaluation and management of her chest pain, she has history of 2 bare-metal stents to the proximal right coronary artery in October 2009, hyperlipidemia, tobacco use, type 2 diabetes and hypertension.  He still smokes but has cut way back. Her heart rate is increased today as a pressure.   Last couple of months she started to develop exertional chest pain, that feels more like indigestion and is a little bit different from the pain that she had prior to placement of her stents. She only take nitroglycerin when the pain is bad and it relieves chest pain 5 minutes. She also states that she has dyspnea on exertion. No palpitations and no syncope.  Home Medications  Prior to Admission medications   Medication Sig Start Date End Date Taking? Authorizing Provider  albuterol (PROVENTIL  HFA;VENTOLIN HFA) 108 (90 BASE) MCG/ACT inhaler Inhale 2 puffs into the lungs every 6 (six) hours as needed. For asthma    Historical Provider, MD  aspirin 81 MG chewable tablet Chew 81 mg by mouth daily.    Historical Provider, MD  atorvastatin (LIPITOR) 80 MG tablet Take 80 mg by mouth at bedtime. 03/27/11 03/26/12  Kendra Cunas, MD  atorvastatin (LIPITOR) 80 MG tablet TAKE 1 TABLET BY MOUTH AT BEDTIME 11/23/12   Kendra Spark, MD  hydrocortisone valerate ointment (WESTCORT) 0.2 % Apply to affected area daily 11/19/12 11/19/13  Kendra Gallant, MD  levothyroxine (SYNTHROID, LEVOTHROID) 150 MCG tablet TAKE 1 TABLET BY MOUTH DAILY 11/25/12   Kendra Gallant, MD  lisinopril (PRINIVIL,ZESTRIL) 5 MG tablet Take 1 tablet (5 mg total) by mouth daily. 07/22/12   Kendra Gallant, MD  metFORMIN (GLUCOPHAGE) 500 MG tablet TAKE 1 TABLET (500 MG TOTAL) BY MOUTH 2 (TWO) TIMES DAILY WITH A MEAL. 12/27/12   Kendra Gallant, MD  metoprolol succinate (TOPROL-XL) 50 MG 24 hr tablet Take one tablet by mouth daily. NO FURTHER REFILLS UNTIL SCHEDULES APPOINTMENT 09/29/12   Kendra Cunas, MD  naproxen (NAPROSYN) 500 MG tablet Take 1 tablet (500 mg total) by mouth 2 (two) times daily with a meal. 01/07/13 01/07/14  Kendra  Alice Rieger, MD  nitroGLYCERIN (NITROSTAT) 0.4 MG SL tablet Place 1 tablet (0.4 mg total) under the tongue every 5 (five) minutes as needed for chest pain. 04/29/12   Kendra Bong, MD  Omega-3 Fatty Acids (FISH OIL) 1000 MG CAPS Take 1 capsule by mouth daily.    Historical Provider, MD  omeprazole (PRILOSEC) 40 MG capsule TAKE 1 CAPSULE BY MOUTH TWICE DAILY 08/03/12   Kendra Gallant, MD  sertraline (ZOLOFT) 50 MG tablet Take 3 tablets (150 mg total) by mouth daily. 10/19/12   Kendra Gallant, MD    Family History  Family History  Problem Relation Age of Onset  . Diabetes Mother   . Hypertension Mother   . Diabetes Father   . Hypertension Father     Social History  History   Social History  . Marital Status: Married    Spouse  Name: N/A    Number of Children: 0  . Years of Education: 10th grade   Occupational History  . Conservation officer, nature     self-employed  . 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 Smoking status: Former Smoker -- 0.15 packs/day for 35 years    Types: Cigarettes  . Smokeless tobacco: Current User     Comment: Quit x 1 week.  . Alcohol Use: No  . Drug Use: No  . Sexual Activity: Not on file   Other Topics Concern  . Not on file   Social History Narrative   Father died of suicide (gunshot) in 56. 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       Patient lives with her husband, her husband's niece Kendra Adams who is mentally retarded and her best friend and the friend's daughter and 6 dogs                 Review of Systems General:  No chills, fever, night sweats or weight changes.  Cardiovascular:  No chest pain, dyspnea on exertion, edema, orthopnea, palpitations, paroxysmal nocturnal dyspnea. Dermatological: No rash, lesions/masses Respiratory: No cough, dyspnea Urologic: No hematuria, dysuria Abdominal:   No nausea, vomiting, diarrhea, bright red blood per rectum, melena, or hematemesis Neurologic:  No visual changes, wkns, changes in mental status. All other systems reviewed and are otherwise negative except as noted above.  Physical Exam  Blood pressure 147/81, pulse 98, height 5' 4"  (1.626 m), weight 257 lb (116.574 kg).  General: Pleasant, NAD, obese Psych: Normal affect. Neuro: Alert and oriented X 3. Moves all extremities spontaneously. HEENT: Normal  Neck: Supple without bruits or JVD. Lungs:  Resp regular and unlabored, CTA. Heart: RRR no s3, s4, or murmurs. Abdomen: Soft, non-tender, non-distended, BS + x 4.  Extremities: No clubbing, cyanosis or edema. DP/PT/Radials 2+ and equal bilaterally.  Accessory Clinical Findings  ECG - sinus rhythm, 80 beats per minute,  normal EKG    Assessment & Plan  52 year old female with known CAD  1. Exertional chest pain, relieved by nitroglycerin but atypical character - we will order an exercise nuclear stress test  2. Hypertension - uncontrolled, however the patient states she hasn't taken her meds today yet  3. Lipid profile - normal limits HDL and LDL, elevated TAG, proper diet was discussed  Follow up in 1 month   Kendra Adams, Lemmie Evens, MD 01/13/2013, 11:52 AM

## 2013-01-14 ENCOUNTER — Ambulatory Visit: Payer: Self-pay

## 2013-01-17 NOTE — Progress Notes (Signed)
Case discussed with Dr. Kazibwe at the time of the visit.  We reviewed the resident's history and exam and pertinent patient test results.  I agree with the assessment, diagnosis and plan of care documented in the resident's note. 

## 2013-01-19 ENCOUNTER — Ambulatory Visit: Payer: Self-pay

## 2013-01-19 NOTE — Addendum Note (Signed)
Addended by: Orson Gear on: 01/19/2013 08:55 AM   Modules accepted: Orders

## 2013-01-19 NOTE — Addendum Note (Signed)
Addended by: Orson Gear on: 01/19/2013 08:53 AM   Modules accepted: Orders

## 2013-01-28 ENCOUNTER — Ambulatory Visit: Payer: Self-pay

## 2013-01-28 ENCOUNTER — Other Ambulatory Visit: Payer: Self-pay | Admitting: Cardiology

## 2013-02-16 ENCOUNTER — Telehealth: Payer: Self-pay | Admitting: *Deleted

## 2013-02-16 NOTE — Telephone Encounter (Signed)
Pt calls and leaves message that she has been "snotty" for several days now, she states it is getting harder to swallow the "snot". Triage called pt to make appt, no answer, had to leave vmail for pt to call back

## 2013-02-19 ENCOUNTER — Emergency Department (HOSPITAL_COMMUNITY): Payer: Self-pay

## 2013-02-19 ENCOUNTER — Emergency Department (HOSPITAL_COMMUNITY)
Admission: EM | Admit: 2013-02-19 | Discharge: 2013-02-19 | Disposition: A | Discharge status: Home / Self Care | Payer: Self-pay | Attending: Emergency Medicine | Admitting: Emergency Medicine

## 2013-02-19 ENCOUNTER — Encounter (HOSPITAL_COMMUNITY): Payer: Self-pay | Admitting: Emergency Medicine

## 2013-02-19 DIAGNOSIS — F3289 Other specified depressive episodes: Secondary | ICD-10-CM | POA: Insufficient documentation

## 2013-02-19 DIAGNOSIS — Z9861 Coronary angioplasty status: Secondary | ICD-10-CM | POA: Insufficient documentation

## 2013-02-19 DIAGNOSIS — J45909 Unspecified asthma, uncomplicated: Secondary | ICD-10-CM | POA: Insufficient documentation

## 2013-02-19 DIAGNOSIS — K219 Gastro-esophageal reflux disease without esophagitis: Secondary | ICD-10-CM | POA: Insufficient documentation

## 2013-02-19 DIAGNOSIS — Z87891 Personal history of nicotine dependence: Secondary | ICD-10-CM | POA: Insufficient documentation

## 2013-02-19 DIAGNOSIS — I10 Essential (primary) hypertension: Secondary | ICD-10-CM | POA: Insufficient documentation

## 2013-02-19 DIAGNOSIS — Z791 Long term (current) use of non-steroidal anti-inflammatories (NSAID): Secondary | ICD-10-CM | POA: Insufficient documentation

## 2013-02-19 DIAGNOSIS — E669 Obesity, unspecified: Secondary | ICD-10-CM | POA: Insufficient documentation

## 2013-02-19 DIAGNOSIS — E785 Hyperlipidemia, unspecified: Secondary | ICD-10-CM | POA: Insufficient documentation

## 2013-02-19 DIAGNOSIS — J029 Acute pharyngitis, unspecified: Secondary | ICD-10-CM | POA: Insufficient documentation

## 2013-02-19 DIAGNOSIS — Z7982 Long term (current) use of aspirin: Secondary | ICD-10-CM | POA: Insufficient documentation

## 2013-02-19 DIAGNOSIS — E119 Type 2 diabetes mellitus without complications: Secondary | ICD-10-CM | POA: Insufficient documentation

## 2013-02-19 DIAGNOSIS — E039 Hypothyroidism, unspecified: Secondary | ICD-10-CM | POA: Insufficient documentation

## 2013-02-19 DIAGNOSIS — Z872 Personal history of diseases of the skin and subcutaneous tissue: Secondary | ICD-10-CM | POA: Insufficient documentation

## 2013-02-19 DIAGNOSIS — Z79899 Other long term (current) drug therapy: Secondary | ICD-10-CM | POA: Insufficient documentation

## 2013-02-19 DIAGNOSIS — F329 Major depressive disorder, single episode, unspecified: Secondary | ICD-10-CM | POA: Insufficient documentation

## 2013-02-19 DIAGNOSIS — Z8669 Personal history of other diseases of the nervous system and sense organs: Secondary | ICD-10-CM | POA: Insufficient documentation

## 2013-02-19 DIAGNOSIS — I252 Old myocardial infarction: Secondary | ICD-10-CM | POA: Insufficient documentation

## 2013-02-19 DIAGNOSIS — IMO0002 Reserved for concepts with insufficient information to code with codable children: Secondary | ICD-10-CM | POA: Insufficient documentation

## 2013-02-19 DIAGNOSIS — I2510 Atherosclerotic heart disease of native coronary artery without angina pectoris: Secondary | ICD-10-CM | POA: Insufficient documentation

## 2013-02-19 LAB — CBC WITH DIFFERENTIAL/PLATELET
Basophils Absolute: 0 10*3/uL (ref 0.0–0.1)
Basophils Relative: 0 % (ref 0–1)
Eosinophils Absolute: 0.3 10*3/uL (ref 0.0–0.7)
Eosinophils Relative: 2 % (ref 0–5)
HCT: 41.9 % (ref 36.0–46.0)
Hemoglobin: 14.5 g/dL (ref 12.0–15.0)
Lymphocytes Relative: 12 % (ref 12–46)
Lymphs Abs: 1.9 10*3/uL (ref 0.7–4.0)
MCH: 32.1 pg (ref 26.0–34.0)
MCHC: 34.6 g/dL (ref 30.0–36.0)
MCV: 92.7 fL (ref 78.0–100.0)
Monocytes Absolute: 1.4 10*3/uL — ABNORMAL HIGH (ref 0.1–1.0)
Monocytes Relative: 8 % (ref 3–12)
Neutro Abs: 12.5 10*3/uL — ABNORMAL HIGH (ref 1.7–7.7)
Neutrophils Relative %: 78 % — ABNORMAL HIGH (ref 43–77)
Platelets: 242 10*3/uL (ref 150–400)
RBC: 4.52 MIL/uL (ref 3.87–5.11)
RDW: 14 % (ref 11.5–15.5)
WBC: 16.1 10*3/uL — ABNORMAL HIGH (ref 4.0–10.5)

## 2013-02-19 LAB — COMPREHENSIVE METABOLIC PANEL
ALT: 29 U/L (ref 0–35)
AST: 35 U/L (ref 0–37)
Albumin: 4 g/dL (ref 3.5–5.2)
Alkaline Phosphatase: 124 U/L — ABNORMAL HIGH (ref 39–117)
BUN: 21 mg/dL (ref 6–23)
CO2: 25 mEq/L (ref 19–32)
Calcium: 9.8 mg/dL (ref 8.4–10.5)
Chloride: 100 mEq/L (ref 96–112)
Creatinine, Ser: 0.67 mg/dL (ref 0.50–1.10)
GFR calc Af Amer: >90 mL/min (ref >90)
GFR calc non Af Amer: >90 mL/min (ref >90)
Glucose, Bld: 154 mg/dL — ABNORMAL HIGH (ref 70–99)
Potassium: 3.7 mEq/L (ref 3.5–5.1)
Sodium: 138 mEq/L (ref 135–145)
Total Bilirubin: 0.4 mg/dL (ref 0.3–1.2)
Total Protein: 7.5 g/dL (ref 6.0–8.3)

## 2013-02-19 LAB — URINALYSIS, ROUTINE W REFLEX MICROSCOPIC
Glucose, UA: NEGATIVE mg/dL
Hgb urine dipstick: NEGATIVE
Ketones, ur: 15 mg/dL — AB
Leukocytes, UA: NEGATIVE
Nitrite: NEGATIVE
Protein, ur: NEGATIVE mg/dL
Specific Gravity, Urine: 1.03 (ref 1.005–1.030)
Urobilinogen, UA: 1 mg/dL (ref 0.0–1.0)
pH: 5.5 (ref 5.0–8.0)

## 2013-02-19 LAB — LIPASE, BLOOD: Lipase: 18 U/L (ref 11–59)

## 2013-02-19 LAB — POCT I-STAT TROPONIN I: Troponin i, poc: 0 ng/mL (ref 0.00–0.08)

## 2013-02-19 MED ORDER — PANTOPRAZOLE SODIUM 40 MG IV SOLR
40.0000 mg | Freq: Once | INTRAVENOUS | Status: AC
  Administered 2013-02-19: 40 mg via INTRAVENOUS
  Filled 2013-02-19: qty 40

## 2013-02-19 MED ORDER — GI COCKTAIL ~~LOC~~
30.0000 mL | Freq: Once | ORAL | Status: AC
  Administered 2013-02-19: 30 mL via ORAL
  Filled 2013-02-19: qty 30

## 2013-02-19 MED ORDER — ONDANSETRON 4 MG PO TBDP
8.0000 mg | ORAL_TABLET | Freq: Once | ORAL | Status: AC
  Administered 2013-02-19: 8 mg via ORAL
  Filled 2013-02-19: qty 2

## 2013-02-19 NOTE — ED Provider Notes (Signed)
CSN: [id number]     Arrival date & time 02/19/13  1543 History   First MD Initiated Contact with Patient 02/19/13 1635     Chief Complaint  Patient presents with  . Sore Throat  . Gastrophageal Reflux   (Consider location/radiation/quality/duration/timing/severity/associated sxs/prior Treatment) HPI  This is a 1 are old female who presents with a five-day history of worsening burning epigastric and chest discomfort. Patient also reports sore throat and feeling like her throat is "raw."  Patient has a history of GERD and is on 40 mg of omeprazole twice a day. Patient states her symptoms are worse with food. She's also had increasing belching. She denies any other chest pain or shortness of breath. She denies any exertional component. Patient denies any difficulty swallowing but does state that her throat feels very dry.  Past Medical History  Diagnosis Date  . Carpal tunnel syndrome on both sides     right hand surgery 1998, left hand surgery 1999.  . Allergic rhinitis   . Asthma   . Depression   . GERD (gastroesophageal reflux disease)   . Hyperlipidemia   . Hypothyroidism   . Psoriasis   . CAD (coronary artery disease)     cath 12/13/10: mRCA 99%, EF 65-70%;  s/p BMS to mRCA  . HTN (hypertension)   . DM2 (diabetes mellitus, type 2)   . Hyperlipidemia   . MI (myocardial infarction)    Past Surgical History  Procedure Laterality Date  . Coronary angioplasty with stent placement    . Carpal tunnel release      w/ bone spurs  . Dilation and curettage of uterus     Family History  Problem Relation Age of Onset  . Diabetes Mother   . Hypertension Mother   . Diabetes Father   . Hypertension Father    History  Substance Use Topics  . Smoking status: Former Smoker -- 0.15 packs/day for 35 years    Types: Cigarettes  . Smokeless tobacco: Current User     Comment: Quit x 1 week.  . Alcohol Use: No   OB History   Grav Para Term Preterm Abortions TAB SAB Ect Mult Living                Review of Systems  Constitutional: Negative for fever.  HENT: Positive for sore throat. Negative for trouble swallowing.   Respiratory: Negative for cough, chest tightness and shortness of breath.   Cardiovascular: Positive for chest pain.  Gastrointestinal: Positive for abdominal pain. Negative for nausea and vomiting.  Genitourinary: Negative for dysuria.  Musculoskeletal: Negative for back pain.  Neurological: Negative for headaches.  All other systems reviewed and are negative.    Allergies  Nsaids and Tramadol hcl  Home Medications   Current Outpatient Rx  Name  Route  Sig  Dispense  Refill  . acetaminophen (TYLENOL) 325 MG tablet   Oral   Take 650 mg by mouth every 4 (four) hours as needed for mild pain.         Marland Kitchen albuterol (PROVENTIL HFA;VENTOLIN HFA) 108 (90 BASE) MCG/ACT inhaler   Inhalation   Inhale 2 puffs into the lungs every 6 (six) hours as needed. For asthma         . aspirin 81 MG chewable tablet   Oral   Chew 81 mg by mouth daily.         Marland Kitchen atorvastatin (LIPITOR) 80 MG tablet   Oral   Take 80 mg by mouth  at bedtime.         . Fluticasone-Salmeterol (ADVAIR) 100-50 MCG/DOSE AEPB   Inhalation   Inhale 2 puffs into the lungs 2 (two) times daily.         . hydrocortisone valerate ointment (WESTCORT) 0.2 %      Apply to affected area daily   45 g   1   . levothyroxine (SYNTHROID, LEVOTHROID) 150 MCG tablet   Oral   Take 150 mcg by mouth daily before breakfast.         . lisinopril (PRINIVIL,ZESTRIL) 10 MG tablet   Oral   Take 10 mg by mouth daily.         . metFORMIN (GLUCOPHAGE) 500 MG tablet   Oral   Take 500 mg by mouth 2 (two) times daily.         . metoprolol succinate (TOPROL-XL) 50 MG 24 hr tablet   Oral   Take 50 mg by mouth daily. Take with or immediately following a meal.         . naproxen (NAPROSYN) 500 MG tablet   Oral   Take 1 tablet (500 mg total) by mouth 2 (two) times daily with a meal.    60 tablet   2   . nitroGLYCERIN (NITROSTAT) 0.4 MG SL tablet   Sublingual   Place 1 tablet (0.4 mg total) under the tongue every 5 (five) minutes as needed for chest pain.   30 tablet   1   . Omega-3 Fatty Acids (FISH OIL) 1000 MG CAPS   Oral   Take 1 capsule by mouth daily.         Marland Kitchen omeprazole (PRILOSEC) 40 MG capsule   Oral   Take 40 mg by mouth 2 (two) times daily.         . sertraline (ZOLOFT) 50 MG tablet   Oral   Take 3 tablets (150 mg total) by mouth daily.   270 tablet   1   . EXPIRED: atorvastatin (LIPITOR) 80 MG tablet   Oral   Take 80 mg by mouth at bedtime.          BP 108/65  Pulse 94  Temp(Src) 98.1 F (36.7 C) (Oral)  Resp 18  SpO2 93% Physical Exam  Nursing note and vitals reviewed. Constitutional: She is oriented to person, place, and time. No distress.  Obese  HENT:  Head: Normocephalic and atraumatic.  Mouth/Throat: Oropharynx is clear and moist. No oropharyngeal exudate.  Neck: Neck supple.  Cardiovascular: Normal rate, regular rhythm and normal heart sounds.   No murmur heard. Pulmonary/Chest: Effort normal and breath sounds normal. No respiratory distress.  Abdominal: Soft. Bowel sounds are normal. There is tenderness. There is no rebound and no guarding.  Mild tenderness to palpation of the epigastrium without rebound or guarding  Musculoskeletal: She exhibits no edema.  Neurological: She is alert and oriented to person, place, and time.  Skin: Skin is warm and dry.  Psychiatric: She has a normal mood and affect.    ED Course  Procedures (including critical care time) Labs Review Labs Reviewed  CBC WITH DIFFERENTIAL - Abnormal; Notable for the following:    WBC 16.1 (*)    Neutrophils Relative % 78 (*)    Neutro Abs 12.5 (*)    Monocytes Absolute 1.4 (*)    All other components within normal limits  COMPREHENSIVE METABOLIC PANEL - Abnormal; Notable for the following:    Glucose, Bld 154 (*)    Alkaline  Phosphatase 124 (*)     All other components within normal limits  URINALYSIS, ROUTINE W REFLEX MICROSCOPIC - Abnormal; Notable for the following:    APPearance CLOUDY (*)    Bilirubin Urine SMALL (*)    Ketones, ur 15 (*)    All other components within normal limits  LIPASE, BLOOD  POCT I-STAT TROPONIN I   Imaging Review Dg Chest 2 View  02/19/2013   CLINICAL DATA:  Five day history of sore throat and shortness of breath. Current history of coronary artery disease, asthma, diabetes and hypertension.  EXAM: CHEST  2 VIEW  COMPARISON:  12/11/2010, 08/08/2010.  FINDINGS: Cardiac silhouette normal in size, unchanged. Thoracic aorta mildly atherosclerotic, unchanged. Hilar and mediastinal contours otherwise unremarkable. Stable minimal linear scarring in the left lower lobe. Lungs otherwise clear. No localized airspace consolidation. No pleural effusions. No pneumothorax. Normal pulmonary vascularity. Degenerative changes throughout the thoracic spine. No significant interval change.  IMPRESSION: No acute cardiopulmonary disease. Minimal linear scarring in the left lower lobe. Stable examination.   Electronically Signed   By: Evangeline Dakin M.D.   On: 02/19/2013 18:25    EKG Interpretation   None      EKG independently reviewed by myself: Sinus tachycardia with a rate of 103, nonspecific ST changes  Medications  ondansetron (ZOFRAN-ODT) disintegrating tablet 8 mg (8 mg Oral Given 02/19/13 1555)  gi cocktail (Maalox,Lidocaine,Donnatal) (30 mLs Oral Given 02/19/13 1735)  pantoprazole (PROTONIX) injection 40 mg (40 mg Intravenous Given 02/19/13 1735)    MDM   1. GERD (gastroesophageal reflux disease)     Patient presents with burning epigastric pain and has a history of GERD. Presentation is consistent with worsening reflux. Less likely ACS; however patient does have a history and risk factors. Screening EKG nonischemic. Basic labwork was obtained and is reassuring including troponin. Chest x-ray is negative.  Patient was given a GI cocktail with some improvement of her symptoms. She was given an IV PPI as well. Her symptoms may be a result of peptic ulcer disease given her tenderness on exam. Patient will be given followup with GI.  Patient was given return precautions.  After history, exam, and medical workup I feel the patient has been appropriately medically screened and is safe for discharge home. Pertinent diagnoses were discussed with the patient. Patient was given return precautions.    Merryl Hacker, MD 02/19/13 [id number]

## 2013-02-19 NOTE — ED Notes (Signed)
Pt has multiple complaints. Reports having acid reflux but x 5 days having sore throat, indigestion, difficulty swallowing, n/v, sob. ekg done at triage. Airway intact.

## 2013-02-21 ENCOUNTER — Encounter (HOSPITAL_COMMUNITY): Payer: Self-pay

## 2013-02-24 ENCOUNTER — Ambulatory Visit: Payer: Self-pay | Admitting: Cardiology

## 2013-02-26 ENCOUNTER — Other Ambulatory Visit: Payer: Self-pay | Admitting: Cardiology

## 2013-03-01 ENCOUNTER — Telehealth: Payer: Self-pay | Admitting: *Deleted

## 2013-03-01 NOTE — Telephone Encounter (Signed)
Pt called - has bronchitis again. Talked with Dr Murlean Caller - needs to go to Urgent Care- there are no appt in clinic for this afternoon. Clinic is closed rest of week. Hilda Blades Viridiana Spaid RN 03/01/13 12N

## 2013-03-08 ENCOUNTER — Encounter: Payer: Self-pay | Admitting: Cardiology

## 2013-04-08 ENCOUNTER — Other Ambulatory Visit: Payer: Self-pay | Admitting: *Deleted

## 2013-04-08 MED ORDER — LEVOTHYROXINE SODIUM 150 MCG PO TABS: 150.0000 ug | ORAL_TABLET | Freq: Every day | ORAL | Status: DC

## 2013-04-22 ENCOUNTER — Ambulatory Visit (INDEPENDENT_AMBULATORY_CARE_PROVIDER_SITE_OTHER): Payer: Self-pay | Admitting: Internal Medicine

## 2013-04-22 ENCOUNTER — Encounter: Payer: Self-pay | Admitting: Internal Medicine

## 2013-04-22 VITALS — BP 119/63 | HR 98 | Temp 98.0°F | Ht 63.5 in | Wt 257.3 lb

## 2013-04-22 DIAGNOSIS — E118 Type 2 diabetes mellitus with unspecified complications: Secondary | ICD-10-CM

## 2013-04-22 DIAGNOSIS — R300 Dysuria: Secondary | ICD-10-CM

## 2013-04-22 DIAGNOSIS — J069 Acute upper respiratory infection, unspecified: Secondary | ICD-10-CM | POA: Insufficient documentation

## 2013-04-22 LAB — POCT URINALYSIS DIPSTICK
Bilirubin, UA: NEGATIVE
Blood, UA: NEGATIVE
Glucose, UA: NEGATIVE
Ketones, UA: NEGATIVE
Leukocytes, UA: NEGATIVE
Nitrite, UA: NEGATIVE
Protein, UA: NEGATIVE
Spec Grav, UA: >=1.03
Urobilinogen, UA: 0.2
pH, UA: 5.5

## 2013-04-22 LAB — POCT GLYCOSYLATED HEMOGLOBIN (HGB A1C): Hemoglobin A1C: 7.3

## 2013-04-22 LAB — GLUCOSE, CAPILLARY: Glucose-Capillary: 107 mg/dL — ABNORMAL HIGH (ref 70–99)

## 2013-04-22 MED ORDER — ALBUTEROL SULFATE HFA 108 (90 BASE) MCG/ACT IN AERS
2.0000 | INHALATION_SPRAY | Freq: Four times a day (QID) | RESPIRATORY_TRACT | Status: DC | PRN
Start: 2013-04-22 — End: 2013-11-01

## 2013-04-22 MED ORDER — DEXTROMETHORPHAN-GUAIFENESIN 10-100 MG/5ML PO LIQD: 5.0000 mL | ORAL | Status: DC | PRN

## 2013-04-22 MED ORDER — SALINE SPRAY 0.65 % NA SOLN: 2.0000 | NASAL | Status: DC | PRN

## 2013-04-22 MED ORDER — FLUTICASONE-SALMETEROL 100-50 MCG/DOSE IN AEPB: 2.0000 | INHALATION_SPRAY | Freq: Two times a day (BID) | RESPIRATORY_TRACT | Status: DC

## 2013-04-22 MED ORDER — AMOXICILLIN-POT CLAVULANATE 875-125 MG PO TABS: 1.0000 | ORAL_TABLET | Freq: Two times a day (BID) | ORAL | Status: AC

## 2013-04-22 NOTE — Assessment & Plan Note (Addendum)
Pt with 6-8 wks+ symptoms with nasal congestion, PND, and sinus ttp. No marked hypoxia. This in the setting of asthma. Pt wheezing on exam and improved with duonebs. ddx include bronchitis, viral URI, or possible sinusitis.  -duoneb in clinic -refill inhalers -symptomatic management with normal saline spray -augmentin

## 2013-04-22 NOTE — Patient Instructions (Signed)
Bronchitis °Bronchitis is swelling (inflammation) of the air tubes leading to your lungs (bronchi). This causes mucus and a cough. If the swelling gets bad, you may have trouble breathing. °HOME CARE  °· Rest. °· Drink enough fluids to keep your pee (urine) clear or pale yellow (unless you have a condition where you have to watch how much you drink). °· Only take medicine as told by your doctor. If you were given antibiotic medicines, finish them even if you start to feel better. °· Avoid smoke, irritating chemicals, and strong smells. These make the problem worse. Quit smoking if you smoke. This helps your lungs heal faster. °· Use a cool mist humidifier. Change the water in the humidifier every day. You can also sit in the bathroom with hot shower running for 5 10 minutes. Keep the door closed. °· See your health care provider as told. °· Wash your hands often. °GET HELP IF: °Your problems do not get better after 1 week. °GET HELP RIGHT AWAY IF:  °· Your fever gets worse. °· You have chills. °· Your chest hurts. °· Your problems breathing get worse. °· You have blood in your mucus. °· You pass out (faint). °· You feel lightheaded. °· You have a bad headache. °· You throw up (vomit) again and again. °MAKE SURE YOU: °· Understand these instructions. °· Will watch your condition. °· Will get help right away if you are not doing well or get worse. °Document Released: 08/13/2007 Document Revised: 12/15/2012 Document Reviewed: 10/19/2012 °[employer]® Patient Information ©2014 [employer]. ° °

## 2013-04-22 NOTE — Progress Notes (Signed)
   Subjective:    Patient ID: Kendra Adams, female    DOB: 1960-05-27, 53 y.o.   MRN: [id number]  Cough Associated symptoms include a fever, postnasal drip, a sore throat, shortness of breath and wheezing. Pertinent negatives include no chest pain, chills, ear pain, headaches or myalgias.  Urinary Tract Infection  Associated symptoms include frequency. Pertinent negatives include no chills, flank pain, hematuria, nausea, urgency or vomiting.   Kendra Adams is a 53 yo woman pmh as listed below presents for cough and urinary frequency.    Pt states that for the past 6-8 wks she has had sinus pressure, congestion, cough sometimes productive with thick yellow phlegm, and subjective fevers. This sequence of symptoms began when her grandnephew was sick with URI symptoms. She has tried OTC sudafed, mucinex, cough syrup with minimal to no relief. She feels this congestion is now obstructing her breathing at night. Pt is also having some SOB with wheezing and using her inhalers more frequently (up to 5x/dy) with minimal relief.    In terms of the pyuria she states " i have had UTIs and this just feels like one." no flank pain, but the pt has noticed some increased frequency especially associated with coughing.   Review of Systems  Constitutional: Positive for fever. Negative for chills, appetite change and fatigue.  HENT: Positive for congestion, postnasal drip, sinus pressure, sore throat and voice change (some hoarseness since coughing). Negative for drooling, ear discharge, ear pain and tinnitus.   Eyes: Negative for discharge.  Respiratory: Positive for cough, shortness of breath and wheezing. Negative for choking and chest tightness.   Cardiovascular: Negative for chest pain, palpitations and leg swelling.  Gastrointestinal: Negative for nausea, vomiting, abdominal pain, diarrhea and constipation.  Endocrine: Negative for polydipsia and polyuria.  Genitourinary: Positive for frequency. Negative for  dysuria, urgency, hematuria, flank pain, decreased urine volume, difficulty urinating and pelvic pain.  Musculoskeletal: Negative for arthralgias, back pain and myalgias.  Neurological: Negative for dizziness and headaches.    Past Medical History  Diagnosis Date  . Carpal tunnel syndrome on both sides     right hand surgery 1998, left hand surgery 1999.  . Allergic rhinitis   . Asthma   . Depression   . GERD (gastroesophageal reflux disease)   . Hyperlipidemia   . Hypothyroidism   . Psoriasis   . CAD (coronary artery disease)     cath 12/13/10: mRCA 99%, EF 65-70%;  s/p BMS to mRCA  . HTN (hypertension)   . DM2 (diabetes mellitus, type 2)   . Hyperlipidemia   . MI (myocardial infarction)    Social, surgical, family history reviewed with patient and updated in appropriate chart locations.     Objective:   Physical Exam Filed Vitals:   04/22/13 1433  BP: 119/63  Pulse: 98  Temp: 98 F (36.7 C)   General: sitting in chair, NAD HEENT: PERRL, EOMI, no scleral icterus, thick yellow PND, no oropharynx exudates or erythema, maxillary sinus ttp Cardiac: RRR, no rubs, murmurs or gallops Pulm: significant wheezing both inspiratory and expiratory, no crackles or rhonchi, moving normal volumes of air after duoneb no crackles or wheezing  Abd: soft, nontender, nondistended, BS present Ext: warm and well perfused, no pedal edema Neuro: alert and oriented X3, cranial nerves II-XII grossly intact    Assessment & Plan:  Please see problem oriented charting  Pt discussed with Dr. Stann Mainland

## 2013-04-23 NOTE — Assessment & Plan Note (Signed)
This most likely some stress incontinence as it is associated with cough. Urine dip was negative for nitrates/leuk esterase.

## 2013-05-06 ENCOUNTER — Other Ambulatory Visit: Payer: Self-pay | Admitting: Cardiology

## 2013-05-09 ENCOUNTER — Encounter: Payer: Self-pay | Admitting: Internal Medicine

## 2013-05-09 ENCOUNTER — Telehealth: Payer: Self-pay | Admitting: *Deleted

## 2013-05-09 ENCOUNTER — Ambulatory Visit (INDEPENDENT_AMBULATORY_CARE_PROVIDER_SITE_OTHER): Payer: Self-pay | Admitting: Internal Medicine

## 2013-05-09 VITALS — BP 122/84 | HR 79 | Temp 99.2°F | Ht 63.5 in | Wt 256.8 lb

## 2013-05-09 DIAGNOSIS — J309 Allergic rhinitis, unspecified: Secondary | ICD-10-CM

## 2013-05-09 DIAGNOSIS — J069 Acute upper respiratory infection, unspecified: Secondary | ICD-10-CM

## 2013-05-09 DIAGNOSIS — R05 Cough: Secondary | ICD-10-CM

## 2013-05-09 DIAGNOSIS — E118 Type 2 diabetes mellitus with unspecified complications: Secondary | ICD-10-CM

## 2013-05-09 DIAGNOSIS — J45909 Unspecified asthma, uncomplicated: Secondary | ICD-10-CM

## 2013-05-09 DIAGNOSIS — R058 Other specified cough: Secondary | ICD-10-CM

## 2013-05-09 LAB — GLUCOSE, CAPILLARY: Glucose-Capillary: 119 mg/dL — ABNORMAL HIGH (ref 70–99)

## 2013-05-09 MED ORDER — TRIAMCINOLONE ACETONIDE 55 MCG/ACT NA AERO: 2.0000 | INHALATION_SPRAY | Freq: Every day | NASAL | Status: DC

## 2013-05-09 MED ORDER — CHLORPHENIRAMINE MALEATE 4 MG PO TABS: 4.0000 mg | ORAL_TABLET | ORAL | Status: DC | PRN

## 2013-05-09 MED ORDER — AZITHROMYCIN 250 MG PO TABS: ORAL_TABLET | ORAL | Status: DC

## 2013-05-09 NOTE — Telephone Encounter (Signed)
Agree. Thanks

## 2013-05-09 NOTE — Assessment & Plan Note (Signed)
Likely viral with upper airway cough syndrome component.  Will defer immediate antibiotics for now.  If no improvement by the end of the week may fill RX for Augmentin course. -chlorpheniramine -nasacort

## 2013-05-09 NOTE — Telephone Encounter (Signed)
Pt called stating she was seen in Clinic on 2/13 for URI. She was given antibiotic, Augmentin for 5 days. Now she chest is again congested, head is congested.  Productive cough. No fever today. Last night had chills. Will see today at 2:45  Pt # [phone]

## 2013-05-09 NOTE — Assessment & Plan Note (Signed)
Without exacerbation, controlled with albuterol

## 2013-05-09 NOTE — Patient Instructions (Signed)
You likely have a respiratory infection caused by a virus which would not need an antibiotic. We have prescribed an antihistamine and nasal spray to help with the nasal drainage and cough. If the cough does not improve in several days, you may fill the antibiotic prescription. Continue to use your albuterol inhaler prn.

## 2013-05-09 NOTE — Progress Notes (Signed)
   Subjective:    Patient ID: Kendra Adams, female    DOB: 02-02-1961, 53 y.o.   MRN: [id number]  HPI  Pt states that she developed burning in nasal passages and sneezing after her grandson was ill. Evaluated in clinic and received Augmentin which patient states helped.  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  Pt states that she has given it about a week to go away but symptoms havent subsided. Using albuterol twice a day.  Also states that her voice sounds raspy. Would like more antibiotics.   Review of Systems  Constitutional: Negative for fever and fatigue.  HENT: Positive for postnasal drip, rhinorrhea and sinus pressure.   Eyes: Negative.   Respiratory: Positive for cough. Negative for chest tightness, shortness of breath and wheezing.   Cardiovascular: Negative for chest pain.  Gastrointestinal: Negative.   Endocrine: Negative.   Genitourinary: Negative.   Neurological: Negative for light-headedness and headaches.  Psychiatric/Behavioral: Negative.        Objective:   Physical Exam  Constitutional: She is oriented to person, place, and time. She appears well-developed and well-nourished. No distress.  HENT:  Head: Normocephalic and atraumatic.  Eyes: Conjunctivae and EOM are normal. Pupils are equal, round, and reactive to light.  Neck: Normal range of motion. Neck supple.  Cardiovascular: Normal rate, regular rhythm, normal heart sounds and intact distal pulses.   Pulmonary/Chest: Effort normal and breath sounds normal. No respiratory distress. She has no wheezes. She has no rales.  Abdominal: Soft. Bowel sounds are normal.  Musculoskeletal: She exhibits no edema.  Neurological: She is alert and oriented to person, place, and time.  Skin: Skin is warm and dry.  Psychiatric: She has a normal mood and affect.          Assessment & Plan:  See problem list charting:

## 2013-05-10 ENCOUNTER — Other Ambulatory Visit: Payer: Self-pay | Admitting: Cardiology

## 2013-05-10 ENCOUNTER — Other Ambulatory Visit: Payer: Self-pay | Admitting: *Deleted

## 2013-05-10 DIAGNOSIS — F329 Major depressive disorder, single episode, unspecified: Secondary | ICD-10-CM

## 2013-05-10 DIAGNOSIS — F3289 Other specified depressive episodes: Secondary | ICD-10-CM

## 2013-05-10 DIAGNOSIS — F411 Generalized anxiety disorder: Secondary | ICD-10-CM

## 2013-05-10 NOTE — Progress Notes (Signed)
Case discussed with Dr. Sadek soon after the resident saw the patient.  We reviewed the resident's history and exam and pertinent patient test results.  I agree with the assessment, diagnosis, and plan of care documented in the resident's note. 

## 2013-05-11 MED ORDER — SERTRALINE HCL 50 MG PO TABS: 150.0000 mg | ORAL_TABLET | Freq: Every day | ORAL | Status: DC

## 2013-05-11 NOTE — Progress Notes (Signed)
Case discussed with Dr. Michail Sermon at the time of the visit.  We reviewed the resident's history and exam and pertinent patient test results.  I agree with the assessment, diagnosis, and plan of care documented in the resident's note. Dr Michail Sermon advised to

## 2013-05-30 ENCOUNTER — Other Ambulatory Visit: Payer: Self-pay | Admitting: Cardiology

## 2013-06-13 ENCOUNTER — Other Ambulatory Visit: Payer: Self-pay | Admitting: *Deleted

## 2013-06-13 MED ORDER — METFORMIN HCL 500 MG PO TABS: 500.0000 mg | ORAL_TABLET | Freq: Two times a day (BID) | ORAL | Status: DC

## 2013-06-14 ENCOUNTER — Other Ambulatory Visit: Payer: Self-pay | Admitting: Cardiology

## 2013-06-23 NOTE — Addendum Note (Signed)
Addended by: Hulan Fray on: 06/23/2013 02:18 PM   Modules accepted: Orders

## 2013-06-29 ENCOUNTER — Ambulatory Visit (INDEPENDENT_AMBULATORY_CARE_PROVIDER_SITE_OTHER): Payer: Self-pay | Admitting: Internal Medicine

## 2013-06-29 ENCOUNTER — Encounter: Payer: Self-pay | Admitting: Internal Medicine

## 2013-06-29 VITALS — BP 121/77 | HR 77 | Temp 97.3°F | Ht 63.0 in | Wt 255.4 lb

## 2013-06-29 DIAGNOSIS — E119 Type 2 diabetes mellitus without complications: Secondary | ICD-10-CM

## 2013-06-29 DIAGNOSIS — R32 Unspecified urinary incontinence: Secondary | ICD-10-CM | POA: Insufficient documentation

## 2013-06-29 LAB — GLUCOSE, CAPILLARY: Glucose-Capillary: 177 mg/dL — ABNORMAL HIGH (ref 70–99)

## 2013-06-29 NOTE — Patient Instructions (Signed)
Do Kegel exercises daily as instructed in the printed material provided to you. Consider stopping smoking. Losing weight also could help improve your symptoms.

## 2013-06-29 NOTE — Progress Notes (Signed)
Subjective:   Patient ID: ALTHEA BACKS female   DOB: [birth year] 53 y.o.   MRN: [id number]  HPI: Ms.Ryllie D Mule is a 53 y.o. woman with PMH significant for HTN, DM-II, HLD, Hypothyroidism comes to the office with a concern for a "urine infection."  Patient reports that 3 days ago when he had to go to restroom to urinate, she could not able to hold her urine and wet her clothes. She states that another such episode happened yesterday and that she is concerned that she might have urine infection. Patient denies any dysuria, burning micturition, increase in the frequency of urination, foul smelling of urine, hematuria, fever, chills, nausea or vomiting.  Patient reports that she can feel the fullness of her bladder and can control the bladder "most of the times" . She denies any changes in the stream of urine, and denies any post voidal fullness of her bladder.   She states that doesn't have kids and had never delivered a live baby even though she has had several miscarriages and an abortion. She states that she had attained menopause in the year 1999 and had never seen a gynecologist in the last 10-15 years.  She denies any other complaints.  Past Medical History  Diagnosis Date  . Carpal tunnel syndrome on both sides     right hand surgery 1998, left hand surgery 1999.  . Allergic rhinitis   . Asthma   . Depression   . GERD (gastroesophageal reflux disease)   . Hyperlipidemia   . Hypothyroidism   . Psoriasis   . CAD (coronary artery disease)     cath 12/13/10: mRCA 99%, EF 65-70%;  s/p BMS to mRCA  . HTN (hypertension)   . DM2 (diabetes mellitus, type 2)   . Hyperlipidemia   . MI (myocardial infarction)    Current Outpatient Prescriptions  Medication Sig Dispense Refill  . acetaminophen (TYLENOL) 325 MG tablet Take 650 mg by mouth every 4 (four) hours as needed for mild pain.      Marland Kitchen albuterol (PROVENTIL HFA;VENTOLIN HFA) 108 (90 BASE) MCG/ACT inhaler Inhale 2 puffs into the lungs  every 6 (six) hours as needed. For asthma  6.7 g  12  . aspirin 81 MG chewable tablet Chew 81 mg by mouth daily.      Marland Kitchen atorvastatin (LIPITOR) 80 MG tablet TAKE 1 TABLET BY MOUTH AT BEDTIME  30 tablet  0  . Fluticasone-Salmeterol (ADVAIR) 100-50 MCG/DOSE AEPB Inhale 2 puffs into the lungs 2 (two) times daily.  60 each  12  . hydrocortisone valerate ointment (WESTCORT) 0.2 % Apply to affected area daily  45 g  1  . levothyroxine (SYNTHROID, LEVOTHROID) 150 MCG tablet Take 1 tablet (150 mcg total) by mouth daily before breakfast.  30 tablet  12  . lisinopril (PRINIVIL,ZESTRIL) 10 MG tablet TAKE 1 TABLET BY MOUTH DAILY  90 tablet  0  . metFORMIN (GLUCOPHAGE) 500 MG tablet Take 1 tablet (500 mg total) by mouth 2 (two) times daily.  60 tablet  0  . metoprolol succinate (TOPROL-XL) 50 MG 24 hr tablet Take 50 mg by mouth daily. Take with or immediately following a meal.      . naproxen (NAPROSYN) 500 MG tablet Take 1 tablet (500 mg total) by mouth 2 (two) times daily with a meal.  60 tablet  2  . nitroGLYCERIN (NITROSTAT) 0.4 MG SL tablet Place 1 tablet (0.4 mg total) under the tongue every 5 (five) minutes as needed for  chest pain.  30 tablet  1  . Omega-3 Fatty Acids (FISH OIL) 1000 MG CAPS Take 1 capsule by mouth daily.      Marland Kitchen omeprazole (PRILOSEC) 40 MG capsule Take 40 mg by mouth 2 (two) times daily.      . sertraline (ZOLOFT) 50 MG tablet Take 3 tablets (150 mg total) by mouth daily.  270 tablet  4  . triamcinolone (NASACORT) 55 MCG/ACT AERO nasal inhaler Place 2 sprays into the nose daily.  1 Inhaler  12   No current facility-administered medications for this visit.   Family History  Problem Relation Age of Onset  . Diabetes Mother   . Hypertension Mother   . Diabetes Father   . Hypertension Father    History   Social History  . Marital Status: Married    Spouse Name: N/A    Number of Children: 0  . Years of Education: 10th grade   Occupational History  . Conservation officer, nature      self-employed  . 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 Smoking status: Former Smoker -- 0.15 packs/day for 35 years    Types: Cigarettes  . Smokeless tobacco: Current User     Comment: Quit x 1 week.  Does 4 puffs and then again then done.  Has an E-cigerrette  . Alcohol Use: No  . Drug Use: No  . Sexual Activity: None   Other Topics Concern  . None   Social History Narrative   Father died of suicide (gunshot) in 1996-05-30. 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       Patient lives with her husband, her husband's niece Tammy who is mentally retarded and her best friend and the friend's daughter and 6 dogs               Review of Systems: Pertinent items are noted in HPI. Objective:  Physical Exam: Filed Vitals:   06/29/13 0836  BP: 121/77  Pulse: 77  Temp: 97.3 F (36.3 C)  TempSrc: Oral  Height: 5' 3"  (1.6 m)  Weight: 255 lb 6.4 oz (115.849 kg)  SpO2: 94%   Constitutional: Vital signs reviewed.   Patient is a well-developed and well-nourished and is in no acute distress and cooperative with exam. Alert and oriented x3.  Nose: No erythema or drainage noted.  Turbinates normal Mouth: no erythema or exudates, MMM Cardiovascular: RRR, S1 normal, S2 normal, no MRG, pulses symmetric and intact bilaterally Pulmonary/Chest: normal respiratory effort, CTAB, no wheezes, rales, or rhonchi Abdominal: Soft, obese. Non-tender, non-distended, bowel sounds are normal, no masses, organomegaly, or guarding present.  GU: no CVA tenderness Neurological: A&O x3, Strength is normal and symmetric bilaterally, cranial nerve II-XII are grossly intact, no focal motor deficit, sensory intact to light touch bilaterally.  Skin: Warm, dry and intact. No rash, cyanosis, or clubbing.  Psychiatric: Normal mood and affect. speech and behavior is normal. Judgment and thought content normal.  Cognition and memory are normal.    Assessment & Plan:

## 2013-06-29 NOTE — Assessment & Plan Note (Signed)
Clinical symptoms suspicious for urge incontinence given leakage of urine with sudden urgency. Urine dip stick negative for nitrites and leukocyte esterase. Discussed with the attending regarding further management.  Plans: Check U/A and urine cultures to rule out infection. Check Bladder scan to evaluate baldder volume, post-residual volume and bladder obstructions. Printed material about kegel exercises is handed to the patient and recommended her to practice it as many times a day as possible. If symptoms worsen or interfere with her life style, a referral to the urology will be considered.

## 2013-06-30 LAB — URINALYSIS, COMPLETE
Bilirubin Urine: NEGATIVE
Casts: NONE SEEN
Crystals: NONE SEEN
Glucose, UA: NEGATIVE mg/dL
Ketones, ur: NEGATIVE mg/dL
Leukocytes, UA: NEGATIVE
Nitrite: POSITIVE — AB
Protein, ur: 30 mg/dL — AB
Specific Gravity, Urine: 1.026 (ref 1.005–1.030)
Urobilinogen, UA: 0.2 mg/dL (ref 0.0–1.0)
pH: 5.5 (ref 5.0–8.0)

## 2013-06-30 NOTE — Progress Notes (Signed)
INTERNAL MEDICINE TEACHING ATTENDING ADDENDUM - Aldine Contes, MD: I reviewed and discussed at the time of visit with the resident Dr.Boggala, the patient's medical history, physical examination, diagnosis and results of tests and treatment and I agree with the patient's care as documented.

## 2013-07-01 ENCOUNTER — Other Ambulatory Visit: Payer: Self-pay | Admitting: Internal Medicine

## 2013-07-01 ENCOUNTER — Telehealth: Payer: Self-pay | Admitting: *Deleted

## 2013-07-01 ENCOUNTER — Telehealth: Payer: Self-pay | Admitting: Internal Medicine

## 2013-07-01 DIAGNOSIS — R32 Unspecified urinary incontinence: Secondary | ICD-10-CM

## 2013-07-01 LAB — URINE CULTURE: Colony Count: >=100000

## 2013-07-01 MED ORDER — CIPROFLOXACIN HCL 500 MG PO TABS: ORAL_TABLET | ORAL | Status: DC

## 2013-07-01 MED ORDER — NITROFURANTOIN MONOHYD MACRO 100 MG PO CAPS: 100.0000 mg | ORAL_CAPSULE | Freq: Two times a day (BID) | ORAL | Status: AC

## 2013-07-01 NOTE — Telephone Encounter (Signed)
Pharmacist calls to let MD know that pt takes 146m of zoloft that combined with cipro may increase chances of arrythmia, do you want them to go ahead and fill the cipro?

## 2013-07-01 NOTE — Telephone Encounter (Signed)
Called patient to discuss about the urine culture findings.  Patient is growing >100K colonies of E.coli. Talked to the patient over the phone and patient reports that she still has symptoms. Will treat her with cipro for 5 days. Patient is aware of this.

## 2013-07-01 NOTE — Telephone Encounter (Signed)
SPOKE WITH PATIENT ABOUT HER INSURANCE. PATIENT STATES THAT HER INSURANCE WILL NOT START UNTIL June OF 2015. MESSAGE TO TRIAGE (KAYE TOOK NOTE TO HELEN) PATIENT STATES SHE WAS ON PHONE WITH DRUG STORE, WHILE TALKING TO ME, SAYS THAT RX THE DOCTOR GAVE HER  WILL NOT WORK WITH HER OTHER MEDICATION.  Kendra Adams NTII [id number]@ 4:26PM

## 2013-07-01 NOTE — Assessment & Plan Note (Signed)
Patients urine dip stick was negative but Urine culture showed >100K E.coli. Given her symptom presentation, will go ahead and treat her with Ciprofloxacin for 5 days. Plans Cipro for 5 days.

## 2013-07-15 ENCOUNTER — Other Ambulatory Visit: Payer: Self-pay | Admitting: Cardiology

## 2013-08-06 ENCOUNTER — Other Ambulatory Visit: Payer: Self-pay | Admitting: Internal Medicine

## 2013-08-16 ENCOUNTER — Other Ambulatory Visit: Payer: Self-pay | Admitting: Cardiology

## 2013-08-17 ENCOUNTER — Other Ambulatory Visit: Payer: Self-pay | Admitting: Cardiology

## 2013-09-06 ENCOUNTER — Other Ambulatory Visit: Payer: Self-pay | Admitting: *Deleted

## 2013-09-06 MED ORDER — ATORVASTATIN CALCIUM 80 MG PO TABS: 80.0000 mg | ORAL_TABLET | Freq: Every day | ORAL | Status: DC

## 2013-09-09 ENCOUNTER — Other Ambulatory Visit: Payer: Self-pay | Admitting: Cardiology

## 2013-09-13 ENCOUNTER — Other Ambulatory Visit: Payer: Self-pay | Admitting: *Deleted

## 2013-09-13 MED ORDER — METOPROLOL SUCCINATE ER 50 MG PO TB24: ORAL_TABLET | ORAL | Status: DC

## 2013-09-13 MED ORDER — OMEPRAZOLE 40 MG PO CPDR: 40.0000 mg | DELAYED_RELEASE_CAPSULE | Freq: Every day | ORAL | Status: DC

## 2013-10-10 ENCOUNTER — Other Ambulatory Visit: Payer: Self-pay | Admitting: Cardiology

## 2013-10-12 ENCOUNTER — Ambulatory Visit (INDEPENDENT_AMBULATORY_CARE_PROVIDER_SITE_OTHER): Payer: BC Managed Care – PPO | Admitting: Cardiology

## 2013-10-12 ENCOUNTER — Encounter: Payer: Self-pay | Admitting: Cardiology

## 2013-10-12 VITALS — BP 112/70 | HR 82 | Ht 63.0 in | Wt 250.0 lb

## 2013-10-12 DIAGNOSIS — R079 Chest pain, unspecified: Secondary | ICD-10-CM | POA: Insufficient documentation

## 2013-10-12 MED ORDER — NITROGLYCERIN 0.4 MG SL SUBL: 0.4000 mg | SUBLINGUAL_TABLET | SUBLINGUAL | Status: DC | PRN

## 2013-10-12 MED ORDER — METOPROLOL SUCCINATE ER 50 MG PO TB24: ORAL_TABLET | ORAL | Status: DC

## 2013-10-12 MED ORDER — LISINOPRIL 10 MG PO TABS: ORAL_TABLET | ORAL | Status: DC

## 2013-10-12 MED ORDER — ATORVASTATIN CALCIUM 80 MG PO TABS: ORAL_TABLET | ORAL | Status: DC

## 2013-10-12 NOTE — Patient Instructions (Signed)
Your physician recommends that you continue on your current medications as directed. Please refer to the Current Medication list given to you today.  REFILLED ALL YOUR CARDIAC MEDICATIONS FOR 1 YEAR  Your physician has requested that you have en exercise stress myoview. For further information please visit [email or web address]. Please follow instruction sheet, as given.  Your physician wants you to follow-up in: [place], UNLESS YOUR EXERCISE MYOVIEW IS ABNORMAL, THEN DR NELSON WILL CALL YOU TO FURTHER ADVISE You will receive a reminder letter in the mail two months in advance. If you don't receive a letter, please call our office to schedule the follow-up appointment.

## 2013-10-12 NOTE — Progress Notes (Signed)
Patient ID: CAYLEEN BENJAMIN, female   DOB: May 03, 1960, 53 y.o.   MRN: [id number]    Patient Name: Kendra Adams Date of Encounter: 10/12/2013  Primary Care Provider:  Clinton Gallant, MD Primary Cardiologist:  Kendra Adams  Patient Profile  Follow up after 2 years, chest pain  Problem List   Past Medical History  Diagnosis Date  . Carpal tunnel syndrome on both sides     right hand surgery 1998, left hand surgery 1999.  . Allergic rhinitis   . Asthma   . Depression   . GERD (gastroesophageal reflux disease)   . Hyperlipidemia   . Hypothyroidism   . Psoriasis   . CAD (coronary artery disease)     cath 12/13/10: mRCA 99%, EF 65-70%;  s/p BMS to mRCA  . HTN (hypertension)   . DM2 (diabetes mellitus, type 2)   . Hyperlipidemia   . MI (myocardial infarction)    Past Surgical History  Procedure Laterality Date  . Coronary angioplasty with stent placement    . Carpal tunnel release      w/ bone spurs  . Dilation and curettage of uterus      Allergies  Allergies  Allergen Reactions  . Nsaids     Rectal bleeds  . Tramadol Hcl Nausea Only    agitation    HPI  Kendra Adams comes in today for evaluation and management of her chest pain, she has history of 2 bare-metal stents to the proximal right coronary artery in October 2009, hyperlipidemia, tobacco use, type 2 diabetes and hypertension.  He still smokes but has cut way back. Her heart rate is increased today as a pressure.   Last couple of months she started to develop exertional chest pain, that feels more like indigestion and is a little bit different from the pain that she had prior to placement of her stents. She only take nitroglycerin when the pain is bad and it relieves chest pain 5 minutes. She also states that she has dyspnea on exertion. No palpitations and no syncope.  10/12/2013 - the patient canceled the stress test as she didn't have insurance. She is coming back she is having daily exertional chest pain and  shortness of breath. She does have occasional palpitations but no syncope. She denies orthopnea paroxysmal nocturnal dyspnea lower extremity edema or claudications. She also needs medication refill.  Home Medications  Prior to Admission medications   Medication Sig Start Date End Date Taking? Authorizing Provider  albuterol (PROVENTIL HFA;VENTOLIN HFA) 108 (90 BASE) MCG/ACT inhaler Inhale 2 puffs into the lungs every 6 (six) hours as needed. For asthma    Historical Provider, MD  aspirin 81 MG chewable tablet Chew 81 mg by mouth daily.    Historical Provider, MD  atorvastatin (LIPITOR) 80 MG tablet Take 80 mg by mouth at bedtime. 03/27/11 03/26/12  Kendra Cunas, MD  atorvastatin (LIPITOR) 80 MG tablet TAKE 1 TABLET BY MOUTH AT BEDTIME 11/23/12   Dorothy Spark, MD  hydrocortisone valerate ointment (WESTCORT) 0.2 % Apply to affected area daily 11/19/12 11/19/13  Kendra Gallant, MD  levothyroxine (SYNTHROID, LEVOTHROID) 150 MCG tablet TAKE 1 TABLET BY MOUTH DAILY 11/25/12   Kendra Gallant, MD  lisinopril (PRINIVIL,ZESTRIL) 5 MG tablet Take 1 tablet (5 mg total) by mouth daily. 07/22/12   Kendra Gallant, MD  metFORMIN (GLUCOPHAGE) 500 MG tablet TAKE 1 TABLET (500 MG TOTAL) BY MOUTH 2 (TWO) TIMES DAILY WITH A MEAL. 12/27/12   Kendra Gallant, MD  metoprolol succinate (TOPROL-XL) 50 MG 24 hr tablet Take one tablet by mouth daily. NO FURTHER REFILLS UNTIL SCHEDULES APPOINTMENT 09/29/12   Kendra Cunas, MD  naproxen (NAPROSYN) 500 MG tablet Take 1 tablet (500 mg total) by mouth 2 (two) times daily with a meal. 01/07/13 01/07/14  Jessee Avers, MD  nitroGLYCERIN (NITROSTAT) 0.4 MG SL tablet Place 1 tablet (0.4 mg total) under the tongue every 5 (five) minutes as needed for chest pain. 04/29/12   Ansel Bong, MD  Omega-3 Fatty Acids (FISH OIL) 1000 MG CAPS Take 1 capsule by mouth daily.    Historical Provider, MD  omeprazole (PRILOSEC) 40 MG capsule TAKE 1 CAPSULE BY MOUTH TWICE DAILY 08/03/12   Kendra Gallant, MD  sertraline  (ZOLOFT) 50 MG tablet Take 3 tablets (150 mg total) by mouth daily. 10/19/12   Kendra Gallant, MD    Family History  Family History  Problem Relation Age of Onset  . Diabetes Mother   . Hypertension Mother   . Diabetes Father   . Hypertension Father     Social History  History   Social History  . Marital Status: Married    Spouse Name: N/A    Number of Children: 0  . Years of Education: 10th grade   Occupational History  . Conservation officer, nature     self-employed  . 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 Smoking status: Former Smoker -- 0.15 packs/day for 35 years    Types: Cigarettes  . Smokeless tobacco: Current User     Comment: Quit x 1 week.  Does 4 puffs and then again then done.  Has an E-cigerrette  . Alcohol Use: No  . Drug Use: No  . Sexual Activity: Not on file   Other Topics Concern  . Not on file   Social History Narrative   Father died of suicide (gunshot) in 20. 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       Patient lives with her husband, her husband's niece Tammy who is mentally retarded and her best friend and the friend's daughter and 6 dogs                 Review of Systems General:  No chills, fever, night sweats or weight changes.  Cardiovascular:  No chest pain, dyspnea on exertion, edema, orthopnea, palpitations, paroxysmal nocturnal dyspnea. Dermatological: No rash, lesions/masses Respiratory: No cough, dyspnea Urologic: No hematuria, dysuria Abdominal:   No nausea, vomiting, diarrhea, bright red blood per rectum, melena, or hematemesis Neurologic:  No visual changes, wkns, changes in mental status. All other systems reviewed and are otherwise negative except as noted above.  Physical Exam  Blood pressure 112/70, pulse 82, height 5' 3"  (1.6 m), weight 250 lb (113.399 kg), SpO2 96.00%.  General: Pleasant, NAD, obese Psych: Normal  affect. Neuro: Alert and oriented X 3. Moves all extremities spontaneously. HEENT: Normal  Neck: Supple without bruits or JVD. Lungs:  Resp regular and unlabored, CTA. Heart: RRR no s3, s4, or murmurs. Abdomen: Soft, non-tender, non-distended, BS + x 4.  Extremities: No clubbing, cyanosis or edema. DP/PT/Radials 2+ and equal bilaterally.  Accessory Clinical Findings  ECG - sinus rhythm, 80 beats per minute, normal EKG    Assessment & Plan  53 year old female with known CAD  1. Exertional chest pain, relieved by  nitroglycerin but atypical character - we will order an exercise nuclear stress test, the origin of this was canceled because of no insurance we'll reschedule.  2. Hypertension - controlled today  3. chest pain - at rest, a day-to-day during the pain was normal and unchanged from prior in December of 2014.  4. Lipid profile - normal limits HDL and LDL, elevated TAG, proper diet was discussed  5. Smoking - trying to quit on e-cigarettes  Follow up in 1 year unless abnormal stress test.  Dorothy Spark, MD 10/12/2013, 8:32 AM

## 2013-10-17 ENCOUNTER — Other Ambulatory Visit: Payer: Self-pay | Admitting: *Deleted

## 2013-10-17 MED ORDER — OMEPRAZOLE 40 MG PO CPDR: 40.0000 mg | DELAYED_RELEASE_CAPSULE | Freq: Every day | ORAL | Status: DC

## 2013-10-19 ENCOUNTER — Ambulatory Visit (HOSPITAL_COMMUNITY): Payer: BC Managed Care – PPO | Attending: Internal Medicine | Admitting: Radiology

## 2013-10-19 VITALS — BP 116/78 | HR 72 | Ht 63.0 in | Wt 240.0 lb

## 2013-10-19 DIAGNOSIS — I10 Essential (primary) hypertension: Secondary | ICD-10-CM | POA: Diagnosis not present

## 2013-10-19 DIAGNOSIS — I252 Old myocardial infarction: Secondary | ICD-10-CM | POA: Insufficient documentation

## 2013-10-19 DIAGNOSIS — E785 Hyperlipidemia, unspecified: Secondary | ICD-10-CM | POA: Insufficient documentation

## 2013-10-19 DIAGNOSIS — Z9861 Coronary angioplasty status: Secondary | ICD-10-CM | POA: Diagnosis not present

## 2013-10-19 DIAGNOSIS — F17200 Nicotine dependence, unspecified, uncomplicated: Secondary | ICD-10-CM | POA: Insufficient documentation

## 2013-10-19 DIAGNOSIS — R079 Chest pain, unspecified: Secondary | ICD-10-CM | POA: Diagnosis present

## 2013-10-19 DIAGNOSIS — J45909 Unspecified asthma, uncomplicated: Secondary | ICD-10-CM | POA: Insufficient documentation

## 2013-10-19 DIAGNOSIS — I2510 Atherosclerotic heart disease of native coronary artery without angina pectoris: Secondary | ICD-10-CM | POA: Insufficient documentation

## 2013-10-19 DIAGNOSIS — R569 Unspecified convulsions: Secondary | ICD-10-CM | POA: Diagnosis not present

## 2013-10-19 DIAGNOSIS — E119 Type 2 diabetes mellitus without complications: Secondary | ICD-10-CM | POA: Insufficient documentation

## 2013-10-19 MED ORDER — TECHNETIUM TC 99M SESTAMIBI GENERIC - CARDIOLITE
33.0000 | Freq: Once | INTRAVENOUS | Status: AC | PRN
Start: 2013-10-19 — End: 2013-10-19
  Administered 2013-10-19: 33 via INTRAVENOUS

## 2013-10-19 NOTE — Progress Notes (Signed)
[facility] [address] [place], [place] [address] [phone]    Cardiology Nuclear Med Study  Kendra Adams is a 53 y.o. female     MRN : [id number]     DOB: September 18, 1960  Procedure Date: 10/19/2013  Nuclear Med Background Indication for Stress Test:  Evaluation for Ischemia and Stent Patency History:  CAD, MI, Cath, Stent, hx. seizures, Asthma Cardiac Risk Factors: Hypertension, Lipids, NIDDM and Smoker  Symptoms:  Chest Pain with Exertion (last date of chest discomfort was yesterday), Dizziness, DOE and Palpitations   Nuclear Pre-Procedure Caffeine/Decaff Intake:2:00am sip of caffeinated mountain dew one teaspoon per patient NPO After: 2:00am   Lungs:  clear O2 Sat: 93% on room air. IV 0.9% NS with Angio Cath:  22g  IV Site: R Antecubital x 1, tolerated well IV Started by:  Irven Baltimore, RN  Chest Size (in):  46 Cup Size: D  Height: 5' 3"  (1.6 m)  Weight:  240 lb (108.863 kg)  BMI:  Body mass index is 42.52 kg/(m^2). Tech Comments:  No medications (Glucophage) this am. Held Toprol x 24 hrs. Irven Baltimore, RN.    Nuclear Med Study 1 or 2 day study: 2 day  Stress Test Type:  Stress  Reading MD: N/A  Order Authorizing Provider:  Ena Dawley, MD  Resting Radionuclide: Technetium 23mSestamibi  Resting Radionuclide Dose: 33.0 mCi on 10/24/13   Stress Radionuclide:  Technetium 955mestamibi  Stress Radionuclide Dose: 33.0 mCi on 10/19/13           Stress Protocol Rest HR: 72 Stress HR: 151  Rest BP: 116/78 Stress BP: 108/91  Exercise Time (min): 5:00 METS: 7.0           Dose of Adenosine (mg):  n/a Dose of Lexiscan: n/a mg  Dose of Atropine (mg): n/a Dose of Dobutamine: n/a mcg/kg/min (at max HR)  Stress Test Technologist: ShGlade LloydBS-ES  Nuclear Technologist:  ElVedia PereyraCNMT     Rest Procedure:  Myocardial perfusion imaging was performed at rest 45 minutes following the intravenous administration of Technetium 9932mestamibi. Rest ECG: NSR - Normal EKG  Stress Procedure:  The patient exercised on the treadmill utilizing the Bruce Protocol for 5:00 minutes. The patient stopped due to being very SOB and denied any chest pain.  Technetium 41m26mtamibi was injected at peak exercise and myocardial perfusion imaging was performed after a brief delay. Stress ECG: No significant ST segment change suggestive of ischemia.  QPS Raw Data Images:  Normal; no motion artifact; normal heart/lung ratio. Stress Images:  Fixed inferior, apical and anterior defect Rest Images:  Fixed inferior, apical and anterior defect Subtraction (SDS):  No evidence of ischemia. Transient Ischemic Dilatation (Normal <1.22):  0.83 Lung/Heart Ratio (Normal <0.45):  0.37  Quantitative Gated Spect Images QGS EDV:  111 ml QGS ESV:  52 ml  Impression Exercise Capacity:  Poor exercise capacity. BP Response:  Normal blood pressure response. Patient was hypotensive during Stage 1, then became hypertensive. Clinical Symptoms:  Significant dyspnea ECG Impression:  No significant ST segment change suggestive of ischemia. Comparison with Prior Nuclear Study: No previous nuclear study performed  Overall Impression:  Low risk stress nuclear study with fixed inferior, apical and anterior attenuation artifacts despite 2 day imaging. Poor exercise capacity..  LV Ejection Fraction: 53%.  LV Wall Motion:  NL LV Function; NL Wall Motion  KennPixie Casino, FACCGov Juan [facility] Luis Hospital & Medical Ctrrd Certified in Nuclear Cardiology Attending Cardiologist  [facility]

## 2013-10-24 ENCOUNTER — Ambulatory Visit (HOSPITAL_COMMUNITY): Payer: BC Managed Care – PPO | Attending: Cardiology

## 2013-10-24 DIAGNOSIS — R0989 Other specified symptoms and signs involving the circulatory and respiratory systems: Secondary | ICD-10-CM

## 2013-10-24 MED ORDER — TECHNETIUM TC 99M SESTAMIBI GENERIC - CARDIOLITE
33.0000 | Freq: Once | INTRAVENOUS | Status: AC | PRN
  Administered 2013-10-24: 33 via INTRAVENOUS

## 2013-11-01 ENCOUNTER — Emergency Department (HOSPITAL_COMMUNITY)
Admission: EM | Admit: 2013-11-01 | Discharge: 2013-11-01 | Disposition: A | Discharge status: Home / Self Care | Payer: BC Managed Care – PPO | Attending: Emergency Medicine | Admitting: Emergency Medicine

## 2013-11-01 ENCOUNTER — Encounter (HOSPITAL_COMMUNITY): Payer: Self-pay | Admitting: Emergency Medicine

## 2013-11-01 ENCOUNTER — Emergency Department (HOSPITAL_COMMUNITY): Payer: BC Managed Care – PPO

## 2013-11-01 DIAGNOSIS — E119 Type 2 diabetes mellitus without complications: Secondary | ICD-10-CM | POA: Diagnosis not present

## 2013-11-01 DIAGNOSIS — I252 Old myocardial infarction: Secondary | ICD-10-CM | POA: Diagnosis not present

## 2013-11-01 DIAGNOSIS — F329 Major depressive disorder, single episode, unspecified: Secondary | ICD-10-CM | POA: Diagnosis not present

## 2013-11-01 DIAGNOSIS — Z872 Personal history of diseases of the skin and subcutaneous tissue: Secondary | ICD-10-CM | POA: Insufficient documentation

## 2013-11-01 DIAGNOSIS — F3289 Other specified depressive episodes: Secondary | ICD-10-CM | POA: Diagnosis not present

## 2013-11-01 DIAGNOSIS — Z8669 Personal history of other diseases of the nervous system and sense organs: Secondary | ICD-10-CM | POA: Insufficient documentation

## 2013-11-01 DIAGNOSIS — Z87891 Personal history of nicotine dependence: Secondary | ICD-10-CM | POA: Insufficient documentation

## 2013-11-01 DIAGNOSIS — J45901 Unspecified asthma with (acute) exacerbation: Secondary | ICD-10-CM | POA: Insufficient documentation

## 2013-11-01 DIAGNOSIS — E785 Hyperlipidemia, unspecified: Secondary | ICD-10-CM | POA: Insufficient documentation

## 2013-11-01 DIAGNOSIS — Z791 Long term (current) use of non-steroidal anti-inflammatories (NSAID): Secondary | ICD-10-CM | POA: Diagnosis not present

## 2013-11-01 DIAGNOSIS — E039 Hypothyroidism, unspecified: Secondary | ICD-10-CM | POA: Diagnosis not present

## 2013-11-01 DIAGNOSIS — I10 Essential (primary) hypertension: Secondary | ICD-10-CM | POA: Insufficient documentation

## 2013-11-01 DIAGNOSIS — Z7982 Long term (current) use of aspirin: Secondary | ICD-10-CM | POA: Diagnosis not present

## 2013-11-01 DIAGNOSIS — J40 Bronchitis, not specified as acute or chronic: Secondary | ICD-10-CM

## 2013-11-01 DIAGNOSIS — R509 Fever, unspecified: Secondary | ICD-10-CM | POA: Insufficient documentation

## 2013-11-01 DIAGNOSIS — J029 Acute pharyngitis, unspecified: Secondary | ICD-10-CM | POA: Insufficient documentation

## 2013-11-01 DIAGNOSIS — IMO0002 Reserved for concepts with insufficient information to code with codable children: Secondary | ICD-10-CM | POA: Insufficient documentation

## 2013-11-01 DIAGNOSIS — K219 Gastro-esophageal reflux disease without esophagitis: Secondary | ICD-10-CM | POA: Insufficient documentation

## 2013-11-01 DIAGNOSIS — Z9861 Coronary angioplasty status: Secondary | ICD-10-CM | POA: Insufficient documentation

## 2013-11-01 LAB — BASIC METABOLIC PANEL
Anion gap: 14 (ref 5–15)
BUN: 11 mg/dL (ref 6–23)
CO2: 24 mEq/L (ref 19–32)
Calcium: 9.5 mg/dL (ref 8.4–10.5)
Chloride: 100 mEq/L (ref 96–112)
Creatinine, Ser: 0.69 mg/dL (ref 0.50–1.10)
GFR calc Af Amer: >90 mL/min (ref >90)
GFR calc non Af Amer: >90 mL/min (ref >90)
Glucose, Bld: 225 mg/dL — ABNORMAL HIGH (ref 70–99)
Potassium: 4 mEq/L (ref 3.7–5.3)
Sodium: 138 mEq/L (ref 137–147)

## 2013-11-01 LAB — CBC WITH DIFFERENTIAL/PLATELET
Basophils Absolute: 0 10*3/uL (ref 0.0–0.1)
Basophils Relative: 0 % (ref 0–1)
Eosinophils Absolute: 0.1 10*3/uL (ref 0.0–0.7)
Eosinophils Relative: 1 % (ref 0–5)
HCT: 41.2 % (ref 36.0–46.0)
Hemoglobin: 13.9 g/dL (ref 12.0–15.0)
Lymphocytes Relative: 14 % (ref 12–46)
Lymphs Abs: 2.9 10*3/uL (ref 0.7–4.0)
MCH: 31.3 pg (ref 26.0–34.0)
MCHC: 33.7 g/dL (ref 30.0–36.0)
MCV: 92.8 fL (ref 78.0–100.0)
Monocytes Absolute: 2 10*3/uL — ABNORMAL HIGH (ref 0.1–1.0)
Monocytes Relative: 10 % (ref 3–12)
Neutro Abs: 15.7 10*3/uL — ABNORMAL HIGH (ref 1.7–7.7)
Neutrophils Relative %: 75 % (ref 43–77)
Platelets: 264 10*3/uL (ref 150–400)
RBC: 4.44 MIL/uL (ref 3.87–5.11)
RDW: 13.6 % (ref 11.5–15.5)
WBC: 20.7 10*3/uL — ABNORMAL HIGH (ref 4.0–10.5)

## 2013-11-01 LAB — RAPID STREP SCREEN (MED CTR MEBANE ONLY): Streptococcus, Group A Screen (Direct): NEGATIVE

## 2013-11-01 MED ORDER — ACETAMINOPHEN 500 MG PO TABS
1000.0000 mg | ORAL_TABLET | Freq: Once | ORAL | Status: AC
  Administered 2013-11-01: 1000 mg via ORAL
  Filled 2013-11-01: qty 2

## 2013-11-01 MED ORDER — HYDROCOD POLST-CHLORPHEN POLST 10-8 MG/5ML PO LQCR: 5.0000 mL | Freq: Two times a day (BID) | ORAL | Status: DC | PRN

## 2013-11-01 MED ORDER — SODIUM CHLORIDE 0.9 % IV BOLUS (SEPSIS)
1000.0000 mL | Freq: Once | INTRAVENOUS | Status: AC
  Administered 2013-11-01: 1000 mL via INTRAVENOUS

## 2013-11-01 MED ORDER — AZITHROMYCIN 250 MG PO TABS: ORAL_TABLET | ORAL | Status: DC

## 2013-11-01 MED ORDER — IOHEXOL 300 MG/ML  SOLN
80.0000 mL | Freq: Once | INTRAMUSCULAR | Status: AC | PRN
  Administered 2013-11-01: 80 mL via INTRAVENOUS

## 2013-11-01 MED ORDER — DEXAMETHASONE SODIUM PHOSPHATE 10 MG/ML IJ SOLN
10.0000 mg | Freq: Once | INTRAMUSCULAR | Status: AC
  Administered 2013-11-01: 10 mg via INTRAVENOUS
  Filled 2013-11-01: qty 1

## 2013-11-01 NOTE — ED Notes (Signed)
Pt is here with cough and fever for at least one week.  Pt states started with left ear infection one week ago and then noted blood in ear.  Pt states that she was coughing all nite and had a fever last nite.  Mask on

## 2013-11-01 NOTE — ED Notes (Signed)
Patient transported to CT 

## 2013-11-01 NOTE — ED Notes (Signed)
Pt reports sore throat.

## 2013-11-01 NOTE — ED Provider Notes (Signed)
CSN: [id number]     Arrival date & time 11/01/13  1201 History  This chart was scribed for non-physician practitioner, Cleatrice Burke, PA-C working with Delora Fuel, MD by Frederich Balding, ED scribe. This patient was seen in room C25C/C25C and the patient's care was started at 4:49 PM.   Chief Complaint  Patient presents with  . Fever  . Cough   The history is provided by the patient. No language interpreter was used.   HPI Comments: Kendra Adams is a 53 y.o. female who presents to the Emergency Department complaining of gradual onset, worsening sore throat that started one week ago. She states it feels like her throat is starting to constrict. Reports associated productive cough, subjective fever and intermittent chills that started last night. States she has had sore throats like this in that past that are normally relieved by amoxicillin. Pt states she had left ear pain about 2 weeks ago. She tried putting alcohol in her ear with no relief. States she tried to put a Q-tip in it and saw nothing but blood on it. Denies any ear pain now. Denies trouble swallowing. She has not recently been on steroids. Pt smokes cigarettes daily but states she has not had any today. She states she is trying to quit but has decreased over the last few days due to being sick.   Past Medical History  Diagnosis Date  . Carpal tunnel syndrome on both sides     right hand surgery 1998, left hand surgery 1999.  . Allergic rhinitis   . Asthma   . Depression   . GERD (gastroesophageal reflux disease)   . Hyperlipidemia   . Hypothyroidism   . Psoriasis   . CAD (coronary artery disease)     cath 12/13/10: mRCA 99%, EF 65-70%;  s/p BMS to mRCA  . HTN (hypertension)   . DM2 (diabetes mellitus, type 2)   . Hyperlipidemia   . MI (myocardial infarction)    Past Surgical History  Procedure Laterality Date  . Coronary angioplasty with stent placement    . Carpal tunnel release      w/ bone spurs  . Dilation and  curettage of uterus     Family History  Problem Relation Age of Onset  . Diabetes Mother   . Hypertension Mother   . Diabetes Father   . Hypertension Father    History  Substance Use Topics  . Smoking status: Former Smoker -- 0.15 packs/day for 35 years    Types: Cigarettes  . Smokeless tobacco: Current User     Comment: Quit x 1 week.  Does 4 puffs and then again then done.  Has an E-cigerrette  . Alcohol Use: No   OB History   Grav Para Term Preterm Abortions TAB SAB Ect Mult Living                 Review of Systems  Constitutional: Positive for fever and chills.  HENT: Positive for sore throat. Negative for ear pain and trouble swallowing.   Respiratory: Positive for cough.   All other systems reviewed and are negative.  Allergies  Nsaids and Tramadol hcl  Home Medications   Prior to Admission medications   Medication Sig Start Date End Date Taking? Authorizing Provider  acetaminophen (TYLENOL) 325 MG tablet Take 650 mg by mouth every 4 (four) hours as needed for mild pain.    Historical Provider, MD  albuterol (PROVENTIL HFA;VENTOLIN HFA) 108 (90 BASE) MCG/ACT inhaler Inhale 2  puffs into the lungs every 6 (six) hours as needed. For asthma 04/22/13   Clinton Gallant, MD  aspirin 81 MG chewable tablet Chew 81 mg by mouth daily.    Historical Provider, MD  atorvastatin (LIPITOR) 80 MG tablet TAKE 1 TABLET (80 MG TOTAL) BY MOUTH AT BEDTIME. 10/12/13   Dorothy Spark, MD  Fluticasone-Salmeterol (ADVAIR) 100-50 MCG/DOSE AEPB Inhale 2 puffs into the lungs 2 (two) times daily. 04/22/13   Clinton Gallant, MD  hydrocortisone valerate ointment (WESTCORT) 0.2 % Apply to affected area daily 11/19/12 11/19/13  Clinton Gallant, MD  levothyroxine (SYNTHROID, LEVOTHROID) 150 MCG tablet Take 1 tablet (150 mcg total) by mouth daily before breakfast. 04/08/13   Clinton Gallant, MD  lisinopril (PRINIVIL,ZESTRIL) 10 MG tablet TAKE 1 TABLET BY MOUTH DAILY 10/12/13   Dorothy Spark, MD  metFORMIN (GLUCOPHAGE) 500 MG  tablet TAKE 1 TABLET (500 MG TOTAL) BY MOUTH 2 (TWO) TIMES DAILY.    Clinton Gallant, MD  metoprolol succinate (TOPROL-XL) 50 MG 24 hr tablet TAKE 1 TABLET BY MOUTH DAILY *NEED DR. APPOINTMENT* 10/12/13   Dorothy Spark, MD  naproxen (NAPROSYN) 500 MG tablet Take 1 tablet (500 mg total) by mouth 2 (two) times daily with a meal. 01/07/13 01/07/14  Jessee Avers, MD  nitroGLYCERIN (NITROSTAT) 0.4 MG SL tablet Place 1 tablet (0.4 mg total) under the tongue every 5 (five) minutes as needed for chest pain. 10/12/13   Dorothy Spark, MD  Omega-3 Fatty Acids (FISH OIL) 1000 MG CAPS Take 1 capsule by mouth daily.    Historical Provider, MD  omeprazole (PRILOSEC) 40 MG capsule Take 1 capsule (40 mg total) by mouth daily. 10/17/13   Clinton Gallant, MD  sertraline (ZOLOFT) 50 MG tablet Take 3 tablets (150 mg total) by mouth daily.    Clinton Gallant, MD  triamcinolone (NASACORT) 55 MCG/ACT AERO nasal inhaler Place 2 sprays into the nose daily. 05/09/13   Valaria Good, MD   BP 123/81  Pulse 96  Temp(Src) 100.6 F (38.1 C) (Oral)  Resp 14  SpO2 94%  Physical Exam  Nursing note and vitals reviewed. Constitutional: She is oriented to person, place, and time. She appears well-developed and well-nourished.  Non-toxic appearance. She does not have a sickly appearance. She does not appear ill. No distress.  Morbidly obese.  HENT:  Head: Normocephalic and atraumatic.  Right Ear: Tympanic membrane, external ear and ear canal normal.  Left Ear: Tympanic membrane, external ear and ear canal normal.  Nose: Nose normal.  Mouth/Throat: No trismus in the jaw. Posterior oropharyngeal edema and posterior oropharyngeal erythema present. No oropharyngeal exudate.  No signs of peritonsillar abscess. No trismus, submental edema or tongue elevation. 2+ tonsillar enlargement bilaterally.   Eyes: Conjunctivae are normal.  Neck: Normal range of motion.  Cardiovascular: Normal rate, regular rhythm and normal heart sounds.    Pulmonary/Chest: Effort normal. No stridor. No respiratory distress. She has wheezes. She has no rales.  Abdominal: Soft. She exhibits no distension.  Musculoskeletal: Normal range of motion.  Lymphadenopathy:    She has no cervical adenopathy.  Neurological: She is alert and oriented to person, place, and time. She has normal strength.  Skin: Skin is warm and dry. She is not diaphoretic. No erythema.  Psychiatric: She has a normal mood and affect. Her behavior is normal.    ED Course  Procedures (including critical care time)  DIAGNOSTIC STUDIES: Oxygen Saturation is 96% on RA, normal by my interpretation.    COORDINATION  OF CARE: 4:58 PM-Discussed treatment plan which includes rapid strep test with pt at bedside and pt agreed to plan.   Labs Review Labs Reviewed  CBC WITH DIFFERENTIAL - Abnormal; Notable for the following:    WBC 20.7 (*)    Neutro Abs 15.7 (*)    Monocytes Absolute 2.0 (*)    All other components within normal limits  BASIC METABOLIC PANEL - Abnormal; Notable for the following:    Glucose, Bld 225 (*)    All other components within normal limits  RAPID STREP SCREEN  CULTURE, GROUP A STREP    Imaging Review Dg Chest 2 View  11/01/2013   CLINICAL DATA:  High fever.  EXAM: CHEST  2 VIEW  COMPARISON:  02/19/2013.  FINDINGS: Normal cardiomediastinal silhouette. Clear lung fields. No effusion or pneumothorax. Degenerative change thoracic spine. Stable linear scarring at the LEFT lung base.  IMPRESSION: Stable chest.  No active infiltrates.   Electronically Signed   By: Rolla Flatten M.D.   On: 11/01/2013 13:09   Ct Soft Tissue Neck W Contrast  11/01/2013   CLINICAL DATA:  Dysphagia. Sensation of throat swelling causing shortness of breath. Productive cough. Leukocytosis.  EXAM: CT NECK WITH CONTRAST  TECHNIQUE: Multidetector CT imaging of the neck was performed using the standard protocol following the bolus administration of intravenous contrast.  CONTRAST:  60m  OMNIPAQUE IOHEXOL 300 MG/ML  SOLN  COMPARISON:  None.  FINDINGS: Mildly enlarged level 2 jugulodigastric lymph nodes bilaterally. The largest is on the right, with a short axis diameter of 11.8 mm on image number 41. No masses, fluid collections or soft tissue swelling demonstrated. Cervical spine degenerative changes. Small amount of probable pleural scarring in the posterior aspect of the right upper lobe on the last image. Otherwise, clear lung apices.  IMPRESSION: 1. Mild bilateral level 2 jugulodigastric adenopathy, most likely reactive. 2. No masses or fluid collections. 3. Normal airway with the exception of an asymmetrically small piriform sinus on the left, especially inferiorly. No mass is visualized. However, consideration of direct visualization is recommended to exclude a subtle mass.   Electronically Signed   By: SEnrique SackM.D.   On: 11/01/2013 19:28     EKG Interpretation None      MDM   Final diagnoses:  Bronchitis  Pharyngitis    Patient presents to ED for evaluation of pharyngitis and cough. Patient is non toxic, non septic in appearance. Elevated WBC of 20.9. CT neck was done which shows enlarged lymph nodes. No airway compromise deep space infection. Patient's chest XR was clear. Patient is a cigarette smoker and has wheezes throughout her lungs. Will give azithromycin for patient's bronchitis as she is at a higher risk for bacterial etiology. Discussed reasons to return to ED immediatly. Vital signs stable for discharge. Discussed case with Dr. GRoxanne Minswho agrees with plan. Patient / Family / Caregiver informed of clinical course, understand medical decision-making process, and agree with plan.    I personally performed the services described in this documentation, which was scribed in my presence. The recorded information has been reviewed and is accurate.  HElwyn Lade PA-C 11/01/13 [id number]

## 2013-11-01 NOTE — Discharge Instructions (Signed)
Pharyngitis Pharyngitis is redness, pain, and swelling (inflammation) of your pharynx.  CAUSES  Pharyngitis is usually caused by infection. Most of the time, these infections are from viruses (viral) and are part of a cold. However, sometimes pharyngitis is caused by bacteria (bacterial). Pharyngitis can also be caused by allergies. Viral pharyngitis may be spread from person to person by coughing, sneezing, and personal items or utensils (cups, forks, spoons, toothbrushes). Bacterial pharyngitis may be spread from person to person by more intimate contact, such as kissing.  SIGNS AND SYMPTOMS  Symptoms of pharyngitis include:   Sore throat.   Tiredness (fatigue).   Low-grade fever.   Headache.  Joint pain and muscle aches.  Skin rashes.  Swollen lymph nodes.  Plaque-like film on throat or tonsils (often seen with bacterial pharyngitis). DIAGNOSIS  Your health care provider will ask you questions about your illness and your symptoms. Your medical history, along with a physical exam, is often all that is needed to diagnose pharyngitis. Sometimes, a rapid strep test is done. Other lab tests may also be done, depending on the suspected cause.  TREATMENT  Viral pharyngitis will usually get better in 3-4 days without the use of medicine. Bacterial pharyngitis is treated with medicines that kill germs (antibiotics).  HOME CARE INSTRUCTIONS   Drink enough water and fluids to keep your urine clear or pale yellow.   Only take over-the-counter or prescription medicines as directed by your health care provider:   If you are prescribed antibiotics, make sure you finish them even if you start to feel better.   Do not take aspirin.   Get lots of rest.   Gargle with 8 oz of salt water ( tsp of salt per 1 qt of water) as often as every 1-2 hours to soothe your throat.   Throat lozenges (if you are not at risk for choking) or sprays may be used to soothe your throat. SEEK MEDICAL  CARE IF:   You have large, tender lumps in your neck.  You have a rash.  You cough up green, yellow-Coffin, or bloody spit. SEEK IMMEDIATE MEDICAL CARE IF:   Your neck becomes stiff.  You drool or are unable to swallow liquids.  You vomit or are unable to keep medicines or liquids down.  You have severe pain that does not go away with the use of recommended medicines.  You have trouble breathing (not caused by a stuffy nose). MAKE SURE YOU:   Understand these instructions.  Will watch your condition.  Will get help right away if you are not doing well or get worse. Document Released: 02/24/2005 Document Revised: 12/15/2012 Document Reviewed: 11/01/2012 [facility] Patient Information 2015 [place], [place]. This information is not intended to replace advice given to you by your health care provider. Make sure you discuss any questions you have with your health care provider.  Upper Respiratory Infection, Adult An upper respiratory infection (URI) is also sometimes known as the common cold. The upper respiratory tract includes the nose, sinuses, throat, trachea, and bronchi. Bronchi are the airways leading to the lungs. Most people improve within 1 week, but symptoms can last up to 2 weeks. A residual cough may last even longer.  CAUSES Many different viruses can infect the tissues lining the upper respiratory tract. The tissues become irritated and inflamed and often become very moist. Mucus production is also common. A cold is contagious. You can easily spread the virus to others by oral contact. This includes kissing, sharing a glass,  coughing, or sneezing. Touching your mouth or nose and then touching a surface, which is then touched by another person, can also spread the virus. SYMPTOMS  Symptoms typically develop 1 to 3 days after you come in contact with a cold virus. Symptoms vary from person to person. They may include:  Runny nose.  Sneezing.  Nasal congestion.  Sinus  irritation.  Sore throat.  Loss of voice (laryngitis).  Cough.  Fatigue.  Muscle aches.  Loss of appetite.  Headache.  Low-grade fever. DIAGNOSIS  You might diagnose your own cold based on familiar symptoms, since most people get a cold 2 to 3 times a year. Your caregiver can confirm this based on your exam. Most importantly, your caregiver can check that your symptoms are not due to another disease such as strep throat, sinusitis, pneumonia, asthma, or epiglottitis. Blood tests, throat tests, and X-rays are not necessary to diagnose a common cold, but they may sometimes be helpful in excluding other more serious diseases. Your caregiver will decide if any further tests are required. RISKS AND COMPLICATIONS  You may be at risk for a more severe case of the common cold if you smoke cigarettes, have chronic heart disease (such as heart failure) or lung disease (such as asthma), or if you have a weakened immune system. The very young and very old are also at risk for more serious infections. Bacterial sinusitis, middle ear infections, and bacterial pneumonia can complicate the common cold. The common cold can worsen asthma and chronic obstructive pulmonary disease (COPD). Sometimes, these complications can require emergency medical care and may be life-threatening. PREVENTION  The best way to protect against getting a cold is to practice good hygiene. Avoid oral or hand contact with people with cold symptoms. Wash your hands often if contact occurs. There is no clear evidence that vitamin C, vitamin E, echinacea, or exercise reduces the chance of developing a cold. However, it is always recommended to get plenty of rest and practice good nutrition. TREATMENT  Treatment is directed at relieving symptoms. There is no cure. Antibiotics are not effective, because the infection is caused by a virus, not by bacteria. Treatment may include:  Increased fluid intake. Sports drinks offer valuable  electrolytes, sugars, and fluids.  Breathing heated mist or steam (vaporizer or shower).  Eating chicken soup or other clear broths, and maintaining good nutrition.  Getting plenty of rest.  Using gargles or lozenges for comfort.  Controlling fevers with ibuprofen or acetaminophen as directed by your caregiver.  Increasing usage of your inhaler if you have asthma. Zinc gel and zinc lozenges, taken in the first 24 hours of the common cold, can shorten the duration and lessen the severity of symptoms. Pain medicines may help with fever, muscle aches, and throat pain. A variety of non-prescription medicines are available to treat congestion and runny nose. Your caregiver can make recommendations and may suggest nasal or lung inhalers for other symptoms.  HOME CARE INSTRUCTIONS   Only take over-the-counter or prescription medicines for pain, discomfort, or fever as directed by your caregiver.  Use a warm mist humidifier or inhale steam from a shower to increase air moisture. This may keep secretions moist and make it easier to breathe.  Drink enough water and fluids to keep your urine clear or pale yellow.  Rest as needed.  Return to work when your temperature has returned to normal or as your caregiver advises. You may need to stay home longer to avoid infecting others. You  can also use a face mask and careful hand washing to prevent spread of the virus. SEEK MEDICAL CARE IF:   After the first few days, you feel you are getting worse rather than better.  You need your caregiver's advice about medicines to control symptoms.  You develop chills, worsening shortness of breath, or [place] or red sputum. These may be signs of pneumonia.  You develop yellow or Gehling nasal discharge or pain in the face, especially when you bend forward. These may be signs of sinusitis.  You develop a fever, swollen neck glands, pain with swallowing, or white areas in the back of your throat. These may be signs  of strep throat. SEEK IMMEDIATE MEDICAL CARE IF:   You have a fever.  You develop severe or persistent headache, ear pain, sinus pain, or chest pain.  You develop wheezing, a prolonged cough, cough up blood, or have a change in your usual mucus (if you have chronic lung disease).  You develop sore muscles or a stiff neck. Document Released: 08/20/2000 Document Revised: 05/19/2011 Document Reviewed: 06/01/2013 [facility] Patient Information 2015 [place], [place]. This information is not intended to replace advice given to you by your health care provider. Make sure you discuss any questions you have with your health care provider.

## 2013-11-01 NOTE — ED Notes (Signed)
Patient transported to X-ray via Thomasena Edis, transporter.

## 2013-11-02 NOTE — ED Provider Notes (Signed)
Medical screening examination/treatment/procedure(s) were performed by non-physician practitioner and as supervising physician I was immediately available for consultation/collaboration.    Delora Fuel, MD 21/11/73 [id number]

## 2013-11-03 LAB — CULTURE, GROUP A STREP

## 2013-11-11 ENCOUNTER — Encounter: Payer: BC Managed Care – PPO | Admitting: Internal Medicine

## 2013-11-14 ENCOUNTER — Other Ambulatory Visit: Payer: Self-pay | Admitting: Cardiology

## 2013-12-14 ENCOUNTER — Encounter: Payer: Self-pay | Admitting: *Deleted

## 2014-01-27 ENCOUNTER — Encounter: Payer: BC Managed Care – PPO | Admitting: Dietician

## 2014-01-27 ENCOUNTER — Encounter: Payer: BC Managed Care – PPO | Admitting: Internal Medicine

## 2014-02-10 ENCOUNTER — Other Ambulatory Visit: Payer: Self-pay | Admitting: Internal Medicine

## 2014-02-10 ENCOUNTER — Observation Stay (HOSPITAL_COMMUNITY)
Admission: AD | Admit: 2014-02-10 | Discharge: 2014-02-11 | Disposition: A | Discharge status: Home / Self Care | Payer: Self-pay | Source: Ambulatory Visit | Attending: Internal Medicine | Admitting: Internal Medicine

## 2014-02-10 ENCOUNTER — Ambulatory Visit (INDEPENDENT_AMBULATORY_CARE_PROVIDER_SITE_OTHER): Payer: Self-pay | Admitting: Dietician

## 2014-02-10 ENCOUNTER — Encounter: Payer: Self-pay | Admitting: Internal Medicine

## 2014-02-10 ENCOUNTER — Encounter: Payer: Self-pay | Admitting: Dietician

## 2014-02-10 ENCOUNTER — Ambulatory Visit (INDEPENDENT_AMBULATORY_CARE_PROVIDER_SITE_OTHER): Payer: Self-pay | Admitting: Internal Medicine

## 2014-02-10 ENCOUNTER — Observation Stay (HOSPITAL_COMMUNITY): Payer: Self-pay

## 2014-02-10 VITALS — BP 115/77 | HR 73 | Temp 97.9°F | Ht 63.0 in | Wt 255.7 lb

## 2014-02-10 DIAGNOSIS — I252 Old myocardial infarction: Secondary | ICD-10-CM | POA: Insufficient documentation

## 2014-02-10 DIAGNOSIS — E119 Type 2 diabetes mellitus without complications: Secondary | ICD-10-CM | POA: Insufficient documentation

## 2014-02-10 DIAGNOSIS — J449 Chronic obstructive pulmonary disease, unspecified: Secondary | ICD-10-CM | POA: Insufficient documentation

## 2014-02-10 DIAGNOSIS — R29898 Other symptoms and signs involving the musculoskeletal system: Secondary | ICD-10-CM | POA: Insufficient documentation

## 2014-02-10 DIAGNOSIS — E785 Hyperlipidemia, unspecified: Secondary | ICD-10-CM | POA: Insufficient documentation

## 2014-02-10 DIAGNOSIS — Z87891 Personal history of nicotine dependence: Secondary | ICD-10-CM | POA: Insufficient documentation

## 2014-02-10 DIAGNOSIS — Z9181 History of falling: Secondary | ICD-10-CM | POA: Insufficient documentation

## 2014-02-10 DIAGNOSIS — G459 Transient cerebral ischemic attack, unspecified: Secondary | ICD-10-CM

## 2014-02-10 DIAGNOSIS — M79606 Pain in leg, unspecified: Secondary | ICD-10-CM | POA: Insufficient documentation

## 2014-02-10 DIAGNOSIS — R079 Chest pain, unspecified: Secondary | ICD-10-CM | POA: Insufficient documentation

## 2014-02-10 DIAGNOSIS — M545 Low back pain, unspecified: Secondary | ICD-10-CM

## 2014-02-10 DIAGNOSIS — F329 Major depressive disorder, single episode, unspecified: Secondary | ICD-10-CM | POA: Insufficient documentation

## 2014-02-10 DIAGNOSIS — Z6841 Body Mass Index (BMI) 40.0 and over, adult: Secondary | ICD-10-CM | POA: Insufficient documentation

## 2014-02-10 DIAGNOSIS — Z794 Long term (current) use of insulin: Secondary | ICD-10-CM | POA: Insufficient documentation

## 2014-02-10 DIAGNOSIS — E039 Hypothyroidism, unspecified: Secondary | ICD-10-CM | POA: Insufficient documentation

## 2014-02-10 DIAGNOSIS — F17200 Nicotine dependence, unspecified, uncomplicated: Secondary | ICD-10-CM | POA: Insufficient documentation

## 2014-02-10 DIAGNOSIS — E669 Obesity, unspecified: Secondary | ICD-10-CM | POA: Insufficient documentation

## 2014-02-10 DIAGNOSIS — I10 Essential (primary) hypertension: Secondary | ICD-10-CM | POA: Insufficient documentation

## 2014-02-10 DIAGNOSIS — G458 Other transient cerebral ischemic attacks and related syndromes: Secondary | ICD-10-CM

## 2014-02-10 DIAGNOSIS — G8929 Other chronic pain: Secondary | ICD-10-CM | POA: Insufficient documentation

## 2014-02-10 DIAGNOSIS — J45909 Unspecified asthma, uncomplicated: Secondary | ICD-10-CM | POA: Insufficient documentation

## 2014-02-10 DIAGNOSIS — I2510 Atherosclerotic heart disease of native coronary artery without angina pectoris: Secondary | ICD-10-CM | POA: Insufficient documentation

## 2014-02-10 DIAGNOSIS — Z7982 Long term (current) use of aspirin: Secondary | ICD-10-CM | POA: Insufficient documentation

## 2014-02-10 DIAGNOSIS — K219 Gastro-esophageal reflux disease without esophagitis: Secondary | ICD-10-CM | POA: Insufficient documentation

## 2014-02-10 DIAGNOSIS — E118 Type 2 diabetes mellitus with unspecified complications: Secondary | ICD-10-CM

## 2014-02-10 DIAGNOSIS — R42 Dizziness and giddiness: Principal | ICD-10-CM | POA: Insufficient documentation

## 2014-02-10 DIAGNOSIS — Z955 Presence of coronary angioplasty implant and graft: Secondary | ICD-10-CM | POA: Insufficient documentation

## 2014-02-10 DIAGNOSIS — R531 Weakness: Secondary | ICD-10-CM | POA: Insufficient documentation

## 2014-02-10 DIAGNOSIS — Z8673 Personal history of transient ischemic attack (TIA), and cerebral infarction without residual deficits: Secondary | ICD-10-CM | POA: Insufficient documentation

## 2014-02-10 LAB — CBC
HCT: 42.6 % (ref 36.0–46.0)
Hemoglobin: 14.3 g/dL (ref 12.0–15.0)
MCH: 31 pg (ref 26.0–34.0)
MCHC: 33.6 g/dL (ref 30.0–36.0)
MCV: 92.2 fL (ref 78.0–100.0)
Platelets: 250 10*3/uL (ref 150–400)
RBC: 4.62 MIL/uL (ref 3.87–5.11)
RDW: 13.6 % (ref 11.5–15.5)
WBC: 11.1 10*3/uL — ABNORMAL HIGH (ref 4.0–10.5)

## 2014-02-10 LAB — COMPREHENSIVE METABOLIC PANEL
ALT: 24 U/L (ref 0–35)
AST: 23 U/L (ref 0–37)
Albumin: 3.7 g/dL (ref 3.5–5.2)
Alkaline Phosphatase: 112 U/L (ref 39–117)
Anion gap: 11 (ref 5–15)
BUN: 10 mg/dL (ref 6–23)
CO2: 28 mEq/L (ref 19–32)
Calcium: 9.9 mg/dL (ref 8.4–10.5)
Chloride: 101 mEq/L (ref 96–112)
Creatinine, Ser: 0.68 mg/dL (ref 0.50–1.10)
GFR calc Af Amer: >90 mL/min (ref >90)
GFR calc non Af Amer: >90 mL/min (ref >90)
Glucose, Bld: 160 mg/dL — ABNORMAL HIGH (ref 70–99)
Potassium: 4 mEq/L (ref 3.7–5.3)
Sodium: 140 mEq/L (ref 137–147)
Total Bilirubin: 0.4 mg/dL (ref 0.3–1.2)
Total Protein: 6.8 g/dL (ref 6.0–8.3)

## 2014-02-10 LAB — GLUCOSE, CAPILLARY
Glucose-Capillary: 123 mg/dL — ABNORMAL HIGH (ref 70–99)
Glucose-Capillary: 143 mg/dL — ABNORMAL HIGH (ref 70–99)

## 2014-02-10 LAB — HM DIABETES EYE EXAM

## 2014-02-10 LAB — TSH: TSH: 10.63 u[IU]/mL — ABNORMAL HIGH (ref 0.350–4.500)

## 2014-02-10 LAB — POCT GLYCOSYLATED HEMOGLOBIN (HGB A1C): Hemoglobin A1C: 7.9

## 2014-02-10 MED ORDER — SERTRALINE HCL 50 MG PO TABS
150.0000 mg | ORAL_TABLET | Freq: Every day | ORAL | Status: DC
  Administered 2014-02-10 – 2014-02-11 (×2): 150 mg via ORAL
  Filled 2014-02-10 (×2): qty 1

## 2014-02-10 MED ORDER — FLUTICASONE PROPIONATE 50 MCG/ACT NA SUSP
2.0000 | Freq: Every day | NASAL | Status: DC
  Administered 2014-02-10 – 2014-02-11 (×2): 2 via NASAL
  Filled 2014-02-10: qty 16

## 2014-02-10 MED ORDER — ALBUTEROL SULFATE (2.5 MG/3ML) 0.083% IN NEBU: 2.5000 mg | INHALATION_SOLUTION | Freq: Four times a day (QID) | RESPIRATORY_TRACT | Status: DC | PRN

## 2014-02-10 MED ORDER — ATORVASTATIN CALCIUM 80 MG PO TABS
80.0000 mg | ORAL_TABLET | Freq: Every day | ORAL | Status: DC
  Administered 2014-02-10 – 2014-02-11 (×2): 80 mg via ORAL
  Filled 2014-02-10: qty 1

## 2014-02-10 MED ORDER — METOPROLOL SUCCINATE ER 25 MG PO TB24
50.0000 mg | ORAL_TABLET | Freq: Every day | ORAL | Status: DC
  Administered 2014-02-10 – 2014-02-11 (×2): 50 mg via ORAL
  Filled 2014-02-10: qty 2

## 2014-02-10 MED ORDER — HEPARIN SODIUM (PORCINE) 5000 UNIT/ML IJ SOLN
5000.0000 [IU] | Freq: Three times a day (TID) | INTRAMUSCULAR | Status: DC
  Administered 2014-02-10 – 2014-02-11 (×3): 5000 [IU] via SUBCUTANEOUS
  Filled 2014-02-10: qty 1

## 2014-02-10 MED ORDER — TRIAMCINOLONE ACETONIDE 55 MCG/ACT NA AERO
2.0000 | INHALATION_SPRAY | Freq: Every day | NASAL | Status: DC
Start: 2014-02-10 — End: 2014-02-10

## 2014-02-10 MED ORDER — PANTOPRAZOLE SODIUM 40 MG PO TBEC
40.0000 mg | DELAYED_RELEASE_TABLET | Freq: Every day | ORAL | Status: DC
  Administered 2014-02-10 – 2014-02-11 (×2): 40 mg via ORAL
  Filled 2014-02-10: qty 1

## 2014-02-10 MED ORDER — ASPIRIN 81 MG PO CHEW
81.0000 mg | CHEWABLE_TABLET | Freq: Every day | ORAL | Status: DC
  Administered 2014-02-11: 81 mg via ORAL
  Filled 2014-02-10: qty 1

## 2014-02-10 MED ORDER — ALBUTEROL SULFATE HFA 108 (90 BASE) MCG/ACT IN AERS: 2.0000 | INHALATION_SPRAY | Freq: Four times a day (QID) | RESPIRATORY_TRACT | Status: DC | PRN

## 2014-02-10 MED ORDER — MOMETASONE FURO-FORMOTEROL FUM 100-5 MCG/ACT IN AERO
2.0000 | INHALATION_SPRAY | Freq: Two times a day (BID) | RESPIRATORY_TRACT | Status: DC
  Administered 2014-02-10 – 2014-02-11 (×3): 2 via RESPIRATORY_TRACT
  Filled 2014-02-10: qty 8.8

## 2014-02-10 MED ORDER — INSULIN ASPART 100 UNIT/ML ~~LOC~~ SOLN
0.0000 [IU] | Freq: Three times a day (TID) | SUBCUTANEOUS | Status: DC
  Administered 2014-02-11: 2 [IU] via SUBCUTANEOUS

## 2014-02-10 MED ORDER — OMEGA-3-ACID ETHYL ESTERS 1 G PO CAPS
1.0000 g | ORAL_CAPSULE | Freq: Every day | ORAL | Status: DC
  Administered 2014-02-10 – 2014-02-11 (×2): 1 g via ORAL
  Filled 2014-02-10: qty 1

## 2014-02-10 MED ORDER — NITROGLYCERIN 0.4 MG SL SUBL: 0.4000 mg | SUBLINGUAL_TABLET | SUBLINGUAL | Status: DC | PRN

## 2014-02-10 MED ORDER — LEVOTHYROXINE SODIUM 50 MCG PO TABS
150.0000 ug | ORAL_TABLET | Freq: Every day | ORAL | Status: DC
  Administered 2014-02-11: 150 ug via ORAL
  Filled 2014-02-10 (×2): qty 1

## 2014-02-10 MED ORDER — FISH OIL 1000 MG PO CAPS: 1.0000 | ORAL_CAPSULE | Freq: Every day | ORAL | Status: DC

## 2014-02-10 MED ORDER — ACETAMINOPHEN 325 MG PO TABS: 650.0000 mg | ORAL_TABLET | ORAL | Status: DC | PRN

## 2014-02-10 MED ORDER — LISINOPRIL 10 MG PO TABS
10.0000 mg | ORAL_TABLET | Freq: Every day | ORAL | Status: DC
  Administered 2014-02-10 – 2014-02-11 (×2): 10 mg via ORAL
  Filled 2014-02-10: qty 1

## 2014-02-10 NOTE — Patient Instructions (Signed)
General Instructions:   Please bring your medicines with you each time you come to clinic.  Medicines may include prescription medications, over-the-counter medications, herbal remedies, eye drops, vitamins, or other pills.   Progress Toward Treatment Goals:  Treatment Goal 11/19/2012  Hemoglobin A1C improved  Blood pressure at goal    Self Care Goals & Plans:  Self Care Goal 02/10/2014  Manage my medications take my medicines as prescribed; bring my medications to every visit; refill my medications on time  Monitor my health -  Eat healthy foods drink diet soda or water instead of juice or soda; eat more vegetables; eat foods that are low in salt; eat baked foods instead of fried foods; eat fruit for snacks and desserts  Be physically active -    No flowsheet data found.   Care Management & Community Referrals:  Referral 11/19/2012  Referrals made for care management support smoking cessation counselor; financial counselor  Referrals made to community resources smoking cessation

## 2014-02-10 NOTE — Progress Notes (Signed)
One of Dr.Butcher associate returned paged and verbalized that he will come see patient shortly. Patient notified. Awaiting for MD to assess patient for further order.  Ave Filter, RN

## 2014-02-10 NOTE — Progress Notes (Addendum)
Patient arrived to room from [facility] Internal Medicine office by wheel chair. She denied any distress or pain at this time. She appears very pleasant. Safety precautions reviewed with patient. No orders active at this time. Dr. Lynnae January paged. Awaiting on call back for order.   Ave Filter, RN

## 2014-02-10 NOTE — Progress Notes (Signed)
Report was called to Nurse on [place].  Pt transported via wheelchair to [address].  Patient alert and oriented.  Saline lock right hand.  Sander Nephew, RN 02/10/2014 4:45 PM.

## 2014-02-10 NOTE — Progress Notes (Signed)
Subjective:   Patient ID: Kendra Adams female   DOB: [birth year] 53 y.o.   MRN: [id number]  HPI: [name] is a 53 y.o. woman with a past medical history as listed below presents for a constellation of symptoms including dizziness, staggering and weakness for about the past month. The patient states that over the past 2 weeks she started noticing dysarthria and stuttering speech which has never occurred to her in the past that was associated with some dizziness and then right and left leg weakness. Since that time her right leg has regained strength but she has not regained strength in the left leg. She feels that she is off balance and cannot walk straight. She has not lost consciousness and had no increased dyspnea on exertion. She has had some blurry vision as well but no urinary or stool incontinence and no numbness at any point. She does continue to smoke about half pack per day and daily marijuana use. She has not had symptoms similar to this in the past.  She states that yesterday she had a sharp sternal chest pain that lasted about 5 minutes relieved by deep breathing did not radiate anywhere and is associated with coughing and is reproducible to touch.    Past Medical History  Diagnosis Date  . Carpal tunnel syndrome on both sides     right hand surgery 1998, left hand surgery 1999.  . Allergic rhinitis   . Asthma   . Depression   . GERD (gastroesophageal reflux disease)   . Hyperlipidemia   . Hypothyroidism   . Psoriasis   . CAD (coronary artery disease)     cath 12/13/10: mRCA 99%, EF 65-70%;  s/p BMS to mRCA  . HTN (hypertension)   . DM2 (diabetes mellitus, type 2)   . Hyperlipidemia   . MI (myocardial infarction)    Current Outpatient Prescriptions  Medication Sig Dispense Refill  . acetaminophen (TYLENOL) 325 MG tablet Take 650 mg by mouth every 4 (four) hours as needed for mild pain.    Marland Kitchen albuterol (PROVENTIL HFA;VENTOLIN HFA) 108 (90 BASE) MCG/ACT inhaler Inhale  2 puffs into the lungs every 6 (six) hours as needed for wheezing or shortness of breath.    Marland Kitchen aspirin 81 MG chewable tablet Chew 81 mg by mouth daily.    Marland Kitchen atorvastatin (LIPITOR) 80 MG tablet Take 80 mg by mouth at bedtime.    Marland Kitchen azithromycin (ZITHROMAX Z-PAK) 250 MG tablet 2 po day one, then 1 daily x 4 days 6 tablet 0  . chlorpheniramine-HYDROcodone (TUSSIONEX PENNKINETIC ER) 10-8 MG/5ML LQCR Take 5 mLs by mouth every 12 (twelve) hours as needed for cough (Cough). 115 mL 0  . Fluticasone-Salmeterol (ADVAIR) 100-50 MCG/DOSE AEPB Inhale 2 puffs into the lungs 2 (two) times daily.    . hydrocortisone valerate ointment (WEST-CORT) 0.2 % Apply 1 application topically 2 (two) times daily.    Marland Kitchen levothyroxine (SYNTHROID, LEVOTHROID) 150 MCG tablet Take 150 mcg by mouth daily before breakfast.    . lisinopril (PRINIVIL,ZESTRIL) 10 MG tablet Take 10 mg by mouth daily.    . metFORMIN (GLUCOPHAGE) 500 MG tablet Take 500 mg by mouth 2 (two) times daily with a meal.    . metoprolol succinate (TOPROL-XL) 50 MG 24 hr tablet Take 50 mg by mouth daily. Take with or immediately following a meal.    . metoprolol succinate (TOPROL-XL) 50 MG 24 hr tablet Take 1 tablet (50 mg total) by mouth daily. 30 tablet 5  .  naproxen (NAPROSYN) 500 MG tablet Take 500 mg by mouth 2 (two) times daily with a meal.    . nitroGLYCERIN (NITROSTAT) 0.4 MG SL tablet Place 0.4 mg under the tongue every 5 (five) minutes as needed for chest pain.    . Omega-3 Fatty Acids (FISH OIL) 1000 MG CAPS Take 1 capsule by mouth daily.    Marland Kitchen omeprazole (PRILOSEC) 40 MG capsule Take 40 mg by mouth daily.    . sertraline (ZOLOFT) 50 MG tablet Take 150 mg by mouth daily.    Marland Kitchen triamcinolone (NASACORT AQ) 55 MCG/ACT AERO nasal inhaler Place 2 sprays into the nose daily.     No current facility-administered medications for this visit.   Family History  Problem Relation Age of Onset  . Diabetes Mother   . Hypertension Mother   . Diabetes Father   .  Hypertension Father    History   Social History  . Marital Status: Married    Spouse Name: N/A    Number of Children: 0  . Years of Education: 10th grade   Occupational History  . Conservation officer, nature     self-employed  . 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 Smoking status: Former Smoker -- 0.15 packs/day for 35 years    Types: Cigarettes  . Smokeless tobacco: Current User     Comment: Quit x 1 week.  Does 4 puffs and then again then done.  Has an E-cigerrette  . Alcohol Use: No  . Drug Use: No  . Sexual Activity: None   Other Topics Concern  . None   Social History Narrative   Father died of suicide (gunshot) in 1996/05/05. 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       Patient lives with her husband, her husband's niece Tammy who is mentally retarded and her best friend and the friend's daughter and 6 dogs               Review of Systems: Pertinent items are noted in HPI. Objective:  Physical Exam: Filed Vitals:   02/10/14 1431  BP: 115/77  Pulse: 73  Temp: 97.9 F (36.6 C)  TempSrc: Oral  Height: 5' 3"  (1.6 m)  Weight: 255 lb 11.2 oz (115.985 kg)  SpO2: 97%   General: sitting in chair, NAD, slightly disheveled and foul-smelling HEENT: PERRL, EOMI, no scleral icterus Cardiac: RRR, no rubs, murmurs or gallops Pulm: clear to auscultation bilaterally, no crackles wheezes or rhonchi moving normal volumes of air Abd: soft, nontender, nondistended, BS present Ext: warm and well perfused, no pedal edema Neuro: alert and oriented X3, cranial nerves II-XII grossly intact, normal sensations bilaterally left lower externally 4/5 strength, 5 out of 5 hand grip, upper extremity and right lower extremity strength, left favored gait  Assessment & Plan:  Please see problem oriented charting  Pt discussed with Dr. Dareen Piano

## 2014-02-10 NOTE — Assessment & Plan Note (Signed)
There was indeed leg weakness noticed on this exam. It does appear that the patient has had waxing and waning symptoms. The most concerning etiology for this would be TIAs. The patient does have high risk factors including smoking, hypertension, hyperlipidemia, diabetes, and previous CAD status post stents. The patient's ABCD 2 score is 4 making her at moderate risk for stroke within 24 hours. -The patient agrees to admission for TIA workup at this time

## 2014-02-10 NOTE — H&P (Signed)
Date: 02/10/2014               Patient Name:  Kendra Adams MRN: [id number]  DOB: 07-18-1960 Age / Sex: 53 y.o., female   PCP: Clinton Gallant, MD         Medical Service: Internal Medicine Teaching Service         Attending Physician: Dr. Bartholomew Crews, MD    First Contact: Dr. Genene Churn Pager: [phone]  Second Contact: Dr. Gordy Levan Pager: [phone]       After Hours (After 5p/  First Contact Pager: [phone]  weekends / holidays): Second Contact Pager: [phone]   Chief Complaint: vertigo, back pain  History of Present Illness:   53 yo female with CAD, MI, Asthma, COPD, HTN, DM II, chronic back pain here with vertigo and LBP and leg pain. She was admitted from the clinic. She has been having vertigo when she turns her head towards right for a long time. Has on going hearing deficit but denies ringing in the ears. Also had 1 episode of when she was walking last week when her right leg "gave away" for few secs then also left leg "gave away" for few seconds and got back to normal. She didn't lose her posture or fall. No other neuro symptoms during that time. Does have chronic LBP and leg pain. Had fell and hit her back several times in the past. Also has chronic b/l knee pain. When last fell few days ago, the episode of leg giving away happened few days after the fall. Never went to ED after the falls because of not having insurance. At one point she even used a walker that she borrowed from someone for few weeks but did not see a doctor for it. Denies any change in vision. Has stress urinary incontinence with coughing, no loss of bowel control.   She does feel dizzy when getting up. States not drinking enough water. This is in addition to her vertigo with head turning tot he right.   She fell on her chest once in the past. Had some sharp pain yesterday lasting about 10 secs and relieving without radiation. Had pain in the chest in the past from injury. Has hx of chronic exertional chest pain, on  nitro, unchanged. Follows with Dr. Meda Coffee for cardiology. nuc test on 10/19/13 showed fixed inferior, apical, and anterior attenuation, poor exercise capacity.   Smokes e-cig and THC. Occasional alcohol use. Compliance with her meds.  Meds: Current Facility-Administered Medications  Medication Dose Route Frequency Provider Last Rate Last Dose  . acetaminophen (TYLENOL) tablet 650 mg  650 mg Oral Q4H PRN Jones Bales, MD      . albuterol (PROVENTIL) (2.5 MG/3ML) 0.083% nebulizer solution 2.5 mg  2.5 mg Nebulization Q6H PRN Bartholomew Crews, MD      . Derrill Memo ON 02/11/2014] aspirin chewable tablet 81 mg  81 mg Oral Daily Jones Bales, MD      . atorvastatin (LIPITOR) tablet 80 mg  80 mg Oral Daily Jones Bales, MD      . fluticasone (FLONASE) 50 MCG/ACT nasal spray 2 spray  2 spray Each Nare Daily Bartholomew Crews, MD      . heparin injection 5,000 Units  5,000 Units Subcutaneous 3 times per day Jones Bales, MD      . Derrill Memo ON 02/11/2014] insulin aspart (novoLOG) injection 0-9 Units  0-9 Units Subcutaneous TID WC Jones Bales, MD      . [  START ON 02/11/2014] levothyroxine (SYNTHROID, LEVOTHROID) tablet 150 mcg  150 mcg Oral QAC breakfast Jones Bales, MD      . lisinopril (PRINIVIL,ZESTRIL) tablet 10 mg  10 mg Oral Daily Jones Bales, MD      . metoprolol succinate (TOPROL-XL) 24 hr tablet 50 mg  50 mg Oral Daily Jones Bales, MD      . mometasone-formoterol (DULERA) 100-5 MCG/ACT inhaler 2 puff  2 puff Inhalation BID Jones Bales, MD      . nitroGLYCERIN (NITROSTAT) SL tablet 0.4 mg  0.4 mg Sublingual Q5 min PRN Jones Bales, MD      . omega-3 acid ethyl esters (LOVAZA) capsule 1 g  1 g Oral Daily Bartholomew Crews, MD      . pantoprazole (PROTONIX) EC tablet 40 mg  40 mg Oral Daily Jones Bales, MD      . sertraline (ZOLOFT) tablet 150 mg  150 mg Oral Daily Jones Bales, MD        Allergies: Allergies as of 02/10/2014 - Review Complete  02/10/2014  Allergen Reaction Noted  . Nsaids  09/25/2011  . Tramadol hcl Nausea Only 02/22/2010   Past Medical History  Diagnosis Date  . Carpal tunnel syndrome on both sides     right hand surgery 1998, left hand surgery 1999.  . Allergic rhinitis   . Asthma   . Depression   . GERD (gastroesophageal reflux disease)   . Hyperlipidemia   . Hypothyroidism   . Psoriasis   . CAD (coronary artery disease)     cath 12/13/10: mRCA 99%, EF 65-70%;  s/p BMS to mRCA  . HTN (hypertension)   . DM2 (diabetes mellitus, type 2)   . Hyperlipidemia   . MI (myocardial infarction)    Past Surgical History  Procedure Laterality Date  . Coronary angioplasty with stent placement    . Carpal tunnel release      w/ bone spurs  . Dilation and curettage of uterus     Family History  Problem Relation Age of Onset  . Diabetes Mother   . Hypertension Mother   . Diabetes Father   . Hypertension Father    History   Social History  . Marital Status: Married    Spouse Name: N/A    Number of Children: 0  . Years of Education: 10th grade   Occupational History  . Conservation officer, nature     self-employed  . 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 Smoking status: Former Smoker -- 0.15 packs/day for 35 years    Types: Cigarettes  . Smokeless tobacco: Current User     Comment: Quit x 1 week.  Does 4 puffs and then again then done.  Has an E-cigerrette  . Alcohol Use: No  . Drug Use: No  . Sexual Activity: Not on file   Other Topics Concern  . Not on file   Social History Narrative   Father died of suicide (gunshot) in 9. 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       Patient lives with her husband, her husband's niece Tammy who is mentally retarded and her best friend and the friend's daughter and 6 dogs                Review of Systems:  Review of Systems  Constitutional: Negative  for fever, chills, weight loss, malaise/fatigue and diaphoresis.  HENT: Negative for congestion, ear pain, hearing loss, nosebleeds, sore throat and tinnitus.   Eyes: Negative for blurred vision, double vision, photophobia, pain, discharge and redness.  Respiratory: Negative for stridor.   Cardiovascular: Positive for chest pain. Negative for palpitations, orthopnea, claudication, leg swelling and PND.       Chronic chest pain with exertion  Gastrointestinal: Negative for heartburn, nausea, vomiting, abdominal pain, diarrhea, constipation, blood in stool and melena.  Genitourinary: Negative for dysuria, urgency, frequency, hematuria and flank pain.       Stress urinary incontinence  Musculoskeletal: Positive for back pain, joint pain and falls.       Knee pain,   Skin: Negative.   Neurological: Positive for dizziness. Negative for tingling, tremors, sensory change, speech change, focal weakness, seizures, loss of consciousness, weakness and headaches.  Endo/Heme/Allergies: Negative.   Psychiatric/Behavioral: Positive for depression and substance abuse. Negative for suicidal ideas, hallucinations and memory loss. The patient is not nervous/anxious and does not have insomnia.      Physical Exam: Blood pressure 124/77, pulse 71, temperature 97.5 F (36.4 C), temperature source Oral, resp. rate 18, SpO2 95 %.  Physical Exam  Constitutional: She is oriented to person, place, and time. She appears well-developed and well-nourished. No distress.  HENT:  Head: Normocephalic and atraumatic.  Right Ear: External ear normal.  Left Ear: External ear normal.  Nose: Nose normal.  Mouth/Throat: Oropharynx is clear and moist. No oropharyngeal exudate.  Eyes: Conjunctivae and EOM are normal. Pupils are equal, round, and reactive to light. Right eye exhibits no discharge. Left eye exhibits no discharge.  Neck: Normal range of motion. Neck supple. No JVD present.  Cardiovascular: Normal rate, regular  rhythm and normal heart sounds.  Exam reveals no gallop and no friction rub.   No murmur heard. Respiratory: Effort normal and breath sounds normal. No stridor. No respiratory distress. She has no wheezes. She has no rales. She exhibits no tenderness.  GI: Soft. Bowel sounds are normal. She exhibits no distension and no mass. There is no tenderness. There is no rebound and no guarding.  obese  Musculoskeletal: Normal range of motion. She exhibits no edema or tenderness.  Lymphadenopathy:    She has no cervical adenopathy.  Neurological: She is alert and oriented to person, place, and time. She has normal reflexes. She displays normal reflexes. No cranial nerve deficit. She exhibits normal muscle tone. Coordination normal.  5/5 strength on all extremities. Negative leg raise sign. 2+ reflex on both legs.  Skin: She is not diaphoretic.     Lab results: Basic Metabolic Panel: No results for input(s): NA, K, CL, CO2, GLUCOSE, BUN, CREATININE, CALCIUM, MG, PHOS in the last 72 hours. Liver Function Tests: No results for input(s): AST, ALT, ALKPHOS, BILITOT, PROT, ALBUMIN in the last 72 hours. No results for input(s): LIPASE, AMYLASE in the last 72 hours. No results for input(s): AMMONIA in the last 72 hours. CBC: No results for input(s): WBC, NEUTROABS, HGB, HCT, MCV, PLT in the last 72 hours. Cardiac Enzymes: No results for input(s): CKTOTAL, CKMB, CKMBINDEX, TROPONINI in the last 72 hours. BNP: No results for input(s): PROBNP in the last 72 hours. D-Dimer: No results for input(s): DDIMER in the last 72 hours. CBG:  Recent Labs  02/10/14 1446  GLUCAP 123*   Hemoglobin A1C:  Recent Labs  02/10/14 1458  HGBA1C 7.9   Fasting Lipid Panel: No  results for input(s): CHOL, HDL, LDLCALC, TRIG, CHOLHDL, LDLDIRECT in the last 72 hours. Thyroid Function Tests: No results for input(s): TSH, T4TOTAL, FREET4, T3FREE, THYROIDAB in the last 72 hours. Anemia Panel: No results for input(s):  VITAMINB12, FOLATE, FERRITIN, TIBC, IRON, RETICCTPCT in the last 72 hours. Coagulation: No results for input(s): LABPROT, INR in the last 72 hours. Urine Drug Screen: Drugs of Abuse     Component Value Date/Time   LABOPIA NONE DETECTED 02/10/2007 2022   COCAINSCRNUR POSITIVE* 02/10/2007 2022   LABBENZ NONE DETECTED 02/10/2007 2022   AMPHETMU NONE DETECTED 02/10/2007 2022   THCU POSITIVE* 02/10/2007 2022   LABBARB  02/10/2007 2022    NONE DETECTED        DRUG SCREEN FOR MEDICAL PURPOSES ONLY.  IF CONFIRMATION IS NEEDED FOR ANY PURPOSE, NOTIFY LAB WITHIN 5 DAYS.    Alcohol Level: No results for input(s): ETH in the last 72 hours. Urinalysis: No results for input(s): COLORURINE, LABSPEC, PHURINE, GLUCOSEU, HGBUR, BILIRUBINUR, KETONESUR, PROTEINUR, UROBILINOGEN, NITRITE, LEUKOCYTESUR in the last 72 hours.  Invalid input(s): APPERANCEUR Misc. Labs:  Imaging results:  No results found.  Other results: EKG:   Assessment & Plan by Problem: Active Problems:   Vertigo  53 yo female with MI, CAD s/p bare metal stents 2x prox RCA 2009, HLD, smoker, DM II, HTN, chronic back pain, depression here with vertigo.  Vertigo - unilateral, only with position changes to right sided head turning, neuro intact otherwise, able to ambulate without problem. Likely peripheral vertigo rather than central. No tinnitus. Does have some chronic hearing deficit per patient. Could be meniere's or BPPV. It is under control. Worsens when patient skips zoloft dose.  - will get orthostats to rule out dizziness 2/2 to volume depletion - may consider getting MRI to rule out brain lesions or vestibular neuroma - PT eval and treat (verstibular therapy?)  Chronic back pain and leg pain with one episode of legs giving away for few secs but didn't fall or have any other neuro deficits. - I don't think she has any weakness on exam and the episode of leg giving away was just from knee pain and back pain rather than TIA  or stroke. - had xray of lumbar spine previously which didn't show anything abnormality 2014. Did fall 1 week ago and did have some feeling of leg giving away transiently. Continuing to have hip pain and leg pain. No neuro deficits, fever, loss of bladder or bowel.  - will repeat Xray lumbar spine. Chronic knee pain without change.  - tylenol for pain.   Chronic exertional chest pain. Hx of MI and CAD. already followed by Dr. Meda Coffee outpatient. Recent nuc stress test shows low prob - not having CP currently so will not do cardiac workup for now unless he has CP. Does have 2 bare metal stents on right circ since 2009. Also DM, HTN, smoker, so high risk for MI. - cont nitro PRN exertional chest pain. - cont asa. Cont lisinopril 19m daily, metoprolol 561mdaily  DM II - takes metformin at home 50018mID. hgba1c 7.9 today.  - cont SSI here. May need to increase metformin dose as hgba1c increased from 04/2013 (7.3)  HTN - under control - cont metoprolol 6m58mily, lisinopril 10mg73mly,   COPD - cont dulera and PRN albuterol. Allergic rhinitis - cont flonase.  HLD - cont lipitor  Depression - under control. Cont zoloft 16mg 28my  Dispo: Disposition is deferred at this time, awaiting improvement of  current medical problems. Anticipated discharge in approximately 1-2 day(s).   The patient does have a current PCP Clinton Gallant, MD) and does need an [facility] hospital follow-up appointment after discharge.  The patient does have transportation limitations that hinder transportation to clinic appointments.  Signed: Dellia Nims, MD 02/10/2014, 7:13 PM

## 2014-02-11 ENCOUNTER — Observation Stay (HOSPITAL_COMMUNITY): Payer: Self-pay

## 2014-02-11 ENCOUNTER — Encounter (HOSPITAL_COMMUNITY): Payer: Self-pay | Admitting: *Deleted

## 2014-02-11 LAB — GLUCOSE, CAPILLARY
Glucose-Capillary: 183 mg/dL — ABNORMAL HIGH (ref 70–99)
Glucose-Capillary: 152 mg/dL — ABNORMAL HIGH (ref 70–99)
Glucose-Capillary: 175 mg/dL — ABNORMAL HIGH (ref 70–99)

## 2014-02-11 LAB — LIPID PANEL
Cholesterol: 152 mg/dL (ref 0–200)
HDL: 28 mg/dL — ABNORMAL LOW (ref >39)
LDL Cholesterol: 96 mg/dL (ref 0–99)
Total CHOL/HDL Ratio: 5.4 RATIO
Triglycerides: 141 mg/dL (ref <150)
VLDL: 28 mg/dL (ref 0–40)

## 2014-02-11 MED ORDER — METFORMIN HCL 500 MG PO TABS: 1000.0000 mg | ORAL_TABLET | Freq: Two times a day (BID) | ORAL | Status: DC

## 2014-02-11 MED ORDER — INSULIN ASPART 100 UNIT/ML ~~LOC~~ SOLN
0.0000 [IU] | Freq: Three times a day (TID) | SUBCUTANEOUS | Status: DC
  Administered 2014-02-11 (×2): 3 [IU] via SUBCUTANEOUS

## 2014-02-11 MED ORDER — GADOBENATE DIMEGLUMINE 529 MG/ML IV SOLN
20.0000 mL | Freq: Once | INTRAVENOUS | Status: AC
  Administered 2014-02-11: 20 mL via INTRAVENOUS

## 2014-02-11 MED ORDER — LEVOTHYROXINE SODIUM 25 MCG PO TABS: 25.0000 ug | ORAL_TABLET | Freq: Every day | ORAL | Status: DC

## 2014-02-11 MED ORDER — INFLUENZA VAC SPLIT QUAD 0.5 ML IM SUSY: 0.5000 mL | PREFILLED_SYRINGE | INTRAMUSCULAR | Status: DC

## 2014-02-11 MED ORDER — LEVOTHYROXINE SODIUM 50 MCG PO TABS: 175.0000 ug | ORAL_TABLET | Freq: Every day | ORAL | Status: DC

## 2014-02-11 NOTE — Progress Notes (Signed)
UR completed 

## 2014-02-11 NOTE — Progress Notes (Signed)
OT Cancellation Note  Patient Details Name: Kendra Adams MRN: [id number] DOB: Jul 09, 1960   Cancelled Treatment:    Reason Eval/Treat Not Completed: Other (comment). Order is for OT is OT/PT order for vestibular. PT will see and then let OT know if we are needed.  Almon Register [phone] 02/11/2014, 7:28 AM

## 2014-02-11 NOTE — Progress Notes (Signed)
Pt. [place] home via car with family.  DC instructions given to patient and fully understood.  Vital signs and assessments were stable prior to discharge.

## 2014-02-11 NOTE — Progress Notes (Signed)
PT Cancellation Note  Patient Details Name: Kendra Adams MRN: [id number] DOB: 1960-12-05   Cancelled Treatment:    Reason Eval/Treat Not Completed: Patient at procedure or test/unavailable.  Pt is out of room at a test.  PT will try to check back later today or tomorrow as time allows.  Thanks,    Barbarann Ehlers. Anali Cabanilla, PT, DPT [phone]   02/11/2014, 10:37 AM

## 2014-02-11 NOTE — Progress Notes (Cosign Needed)
Subjective:  Doing well. No dizziness currently. Felt somewhat anxious at MRI. Denies any sob, cp, cough, n/v, fever, chills, headache, numbness, weakness, tingling. Feels fine today. Has her chronic back pain. Wants to go home.   Objective: Vital signs in last 24 hours: Filed Vitals:   02/11/14 0209 02/11/14 0542 02/11/14 1100 02/11/14 1432  BP: 113/60 110/74 135/99 102/59  Pulse: 65 70 77 62  Temp: 97.6 F (36.4 C) 97.6 F (36.4 C) 98.1 F (36.7 C) 98.1 F (36.7 C)  TempSrc: Oral Oral Oral Oral  Resp: 16 16 18 18   Height:      Weight:      SpO2: 94% 96% 95% 95%   Weight change:  No intake or output data in the 24 hours ending 02/11/14 1505 Vitals reviewed. General: resting in bed, NAD HEENT: PERRL, EOMI, no scleral icterus Cardiac: RRR, no rubs, murmurs or gallops Pulm: clear to auscultation bilaterally, no wheezes, rales, or rhonchi Abd: soft, nontender, nondistended, BS present, obese. Ext: warm and well perfused, no pedal edema Neuro: alert and oriented X3, cranial nerves II-XII grossly intact, strength and sensation to light touch equal in bilateral upper and lower extremities  Lab Results: Basic Metabolic Panel:  Recent Labs Lab 02/10/14 2237  NA 140  K 4.0  CL 101  CO2 28  GLUCOSE 160*  BUN 10  CREATININE 0.68  CALCIUM 9.9   Liver Function Tests:  Recent Labs Lab 02/10/14 2237  AST 23  ALT 24  ALKPHOS 112  BILITOT 0.4  PROT 6.8  ALBUMIN 3.7   No results for input(s): LIPASE, AMYLASE in the last 168 hours. No results for input(s): AMMONIA in the last 168 hours. CBC:  Recent Labs Lab 02/10/14 2237  WBC 11.1*  HGB 14.3  HCT 42.6  MCV 92.2  PLT 250   Cardiac Enzymes: No results for input(s): CKTOTAL, CKMB, CKMBINDEX, TROPONINI in the last 168 hours. BNP: No results for input(s): PROBNP in the last 168 hours. D-Dimer: No results for input(s): DDIMER in the last 168 hours. CBG:  Recent Labs Lab 02/10/14 1446 02/10/14 2128  02/11/14 0544 02/11/14 1133  GLUCAP 123* 143* 183* 152*   Hemoglobin A1C:  Recent Labs Lab 02/10/14 1458  HGBA1C 7.9   Fasting Lipid Panel:  Recent Labs Lab 02/11/14 0852  CHOL 152  HDL 28*  LDLCALC 96  TRIG 141  CHOLHDL 5.4   Thyroid Function Tests:  Recent Labs Lab 02/10/14 2237  TSH 10.630*   Coagulation: No results for input(s): LABPROT, INR in the last 168 hours. Anemia Panel: No results for input(s): VITAMINB12, FOLATE, FERRITIN, TIBC, IRON, RETICCTPCT in the last 168 hours. Urine Drug Screen: Drugs of Abuse     Component Value Date/Time   LABOPIA NONE DETECTED 02/10/2007 2022   COCAINSCRNUR POSITIVE* 02/10/2007 2022   LABBENZ NONE DETECTED 02/10/2007 2022   AMPHETMU NONE DETECTED 02/10/2007 2022   THCU POSITIVE* 02/10/2007 2022   LABBARB  02/10/2007 2022    NONE DETECTED        DRUG SCREEN FOR MEDICAL PURPOSES ONLY.  IF CONFIRMATION IS NEEDED FOR ANY PURPOSE, NOTIFY LAB WITHIN 5 DAYS.    Alcohol Level: No results for input(s): ETH in the last 168 hours. Urinalysis: No results for input(s): COLORURINE, LABSPEC, PHURINE, GLUCOSEU, HGBUR, BILIRUBINUR, KETONESUR, PROTEINUR, UROBILINOGEN, NITRITE, LEUKOCYTESUR in the last 168 hours.  Invalid input(s): APPERANCEUR Misc. Labs:  Micro Results: No results found for this or any previous visit (from the past 240 hour(s)). Studies/Results: Dg  Lumbar Spine Complete  02/10/2014   CLINICAL DATA:  Initial evaluation for low back pain with fall 6 days ago, personal history of chronic back pain  EXAM: LUMBAR SPINE - COMPLETE 4+ VIEW  COMPARISON:  April 08, 2012  FINDINGS: Large bilateral bridging osteophytes throughout the lumbar spine. Normal anterior-posterior alignment. No fracture. Degenerative facet changes at every level through the lumbar spine.  IMPRESSION: Prominent osteophyte formation with relative maintenance of disc space throughout the lumbar spine. Osteophytes are progressively enlarged when  compared to prior study. Significant degenerative facet change is stable.   Electronically Signed   By: Skipper Cliche M.D.   On: 02/10/2014 20:26   Mr Jeri Cos GE Contrast  02/11/2014   CLINICAL DATA:  Vertigo. No neurologic deficit. History of hypertension and diabetes.  EXAM: MRI HEAD WITHOUT AND WITH CONTRAST  TECHNIQUE: Multiplanar, multiecho pulse sequences of the brain and surrounding structures were obtained without and with intravenous contrast.  CONTRAST:  MultiHance 20 mL.  COMPARISON:  CT head 11/12/2009.  FINDINGS: No evidence for acute infarction, hemorrhage, mass lesion, hydrocephalus, or extra-axial fluid. Chronic RIGHT parietal infarct with gliosis and encephalomalacia. Trace mineralization. Otherwise normal for age cerebral volume. Mild subcortical and periventricular T2 and FLAIR hyperintensities, likely chronic microvascular ischemic change.  Pituitary, pineal, and cerebellar tonsils unremarkable. No upper cervical lesions. Flow voids are maintained in the major intracranial vessels. Visualized calvarium, skull base, and upper cervical osseous structures unremarkable. Scalp and extracranial soft tissues, orbits, sinuses, and mastoids show no acute process.  Post infusion, no abnormal enhancement of the brain or meninges. Within limits of detection on routine brain MR, no visible vestibular schwannoma or posterior fossa mass.  IMPRESSION: Chronic RIGHT parietal infarct. No acute intracranial findings. No abnormal postcontrast enhancement. No obvious acute or focal temporal bone abnormality.   Electronically Signed   By: Rolla Flatten M.D.   On: 02/11/2014 12:00   Medications: I have reviewed the patient's current medications. Scheduled Meds: . aspirin  81 mg Oral Daily  . atorvastatin  80 mg Oral Daily  . fluticasone  2 spray Each Nare Daily  . heparin  5,000 Units Subcutaneous 3 times per day  . [START ON 02/12/2014] Influenza vac split quadrivalent PF  0.5 mL Intramuscular Tomorrow-1000   . insulin aspart  0-15 Units Subcutaneous TID WC  . levothyroxine  150 mcg Oral QAC breakfast  . lisinopril  10 mg Oral Daily  . metoprolol succinate  50 mg Oral Daily  . mometasone-formoterol  2 puff Inhalation BID  . omega-3 acid ethyl esters  1 g Oral Daily  . pantoprazole  40 mg Oral Daily  . sertraline  150 mg Oral Daily   Continuous Infusions:  PRN Meds:.acetaminophen, albuterol, nitroGLYCERIN Assessment/Plan: Active Problems:   Vertigo  53 yo with CAD, CAD s/p bms 2x RCA 2009, HLD, smoker, DM II, HTN, chornic back pain, depression comes in with vertigo, and frequent falls  Vertigo - unilateral, only with position changes to right sided head turning, neuro intact otherwise, able to ambulate without problem. Likely peripheral vertigo rather than central. No tinnitus. Does have some chronic hearing deficit per patient. Could be meniere's or BPPV. It is under control.  - will get orthostats to rule out dizziness 2/2 to volume depletion - has chornic right parietal infarct seen on MRI but this will not explain her vertigo or the leg weakness she had. - PT eval and treat (verstibular therapy?)  Chronic right parietal stroke - is on asa. Not  sure if neuro consult is warranted. Main thing will be risk reduction. - cont asa? Control diabetes and HTN, is on statin for HLD.  Chronic back pain and leg pain with one episode of legs giving away for few secs but didn't fall or have any other neuro deficits. - I don't think she has any weakness on exam and the episode of leg giving away was just from knee pain and back pain rather than TIA or stroke. - had xray of lumbar spine previously which didn't show anything abnormality 2014. Did fall 1 week ago and did have some feeling of leg giving away transiently. Continuing to have hip pain and leg pain. No neuro deficits, fever, loss of bladder or bowel. - repeat xray lumbar spine shows severe degenerative changes. - tylenol for pain.  - mri shows  chronic right parietal infarct. This will not explain her leg weakness/giving away though.  Hypothyroidism - patient was taking 163mg synthyroid at home. Has been compliant. But TSH 10.630.  - will increase synthyroid to 1751m.   Chronic exertional chest pain. Hx of MI and CAD. already followed by Dr. NeMeda Coffeeutpatient. Recent nuc stress test shows low prob - not having CP currently so will not do cardiac workup for now unless he has CP. Does have 2 bare metal stents on right circ since 2009. Also DM, HTN, smoker, so high risk for MI. - cont nitro PRN exertional chest pain. - cont asa. Cont lisinopril 1085maily, metoprolol 61m62mily  DM II - takes metformin at home 500mg10m. hgba1c 7.9 today.  - cont SSI here. May need to increase metformin dose as hgba1c increased from 04/2013 (7.3)  HTN - under control - cont metoprolol 61mg 22my, lisinopril 10mg d92m,   COPD - cont dulera and PRN albuterol. Allergic rhinitis - cont flonase.  HLD - cont lipitor   Dispo: Disposition is deferred at this time, awaiting improvement of current medical problems.  Anticipated discharge in approximately 1-2 day(s).   The patient does have a current PCP (Nora SClinton [facility]antnd [facility].  The patient does have transportation limitations that hinder transportation to clinic appointments.  .Services Needed at time of discharge: Y = Yes, Blank = No PT:   OT:   RN:   Equipment:   Other:     LOS: 1 day   Zeshan Sena Dellia Nims/07/2013, 3:05 PM

## 2014-02-11 NOTE — Discharge Summary (Signed)
Name: Kendra Adams MRN: [id number] DOB: 1960/09/07 53 y.o. PCP: Kendra Gallant, MD  Date of Admission: 02/10/2014  4:54 PM Date of Discharge: 02/11/2014 Attending Physician: Bartholomew Crews, MD  Discharge Diagnosis:  Active Problems:   Vertigo  Discharge Medications:   Medication List    STOP taking these medications        azithromycin 250 MG tablet  Commonly known as:  ZITHROMAX Z-PAK     naproxen 500 MG tablet  Commonly known as:  NAPROSYN      TAKE these medications        acetaminophen 325 MG tablet  Commonly known as:  TYLENOL  Take 650 mg by mouth every 4 (four) hours as needed for mild pain.     albuterol 108 (90 BASE) MCG/ACT inhaler  Commonly known as:  PROVENTIL HFA;VENTOLIN HFA  Inhale 2 puffs into the lungs every 6 (six) hours as needed for wheezing or shortness of breath.     aspirin 81 MG chewable tablet  Chew 81 mg by mouth daily.     atorvastatin 80 MG tablet  Commonly known as:  LIPITOR  Take 80 mg by mouth daily.     chlorpheniramine-HYDROcodone 10-8 MG/5ML Lqcr  Commonly known as:  TUSSIONEX PENNKINETIC ER  Take 5 mLs by mouth every 12 (twelve) hours as needed for cough (Cough).     Fish Oil 1000 MG Caps  Take 1 capsule by mouth daily.     Fluticasone-Salmeterol 100-50 MCG/DOSE Aepb  Commonly known as:  ADVAIR  Inhale 2 puffs into the lungs 2 (two) times daily.     hydrocortisone valerate ointment 0.2 %  Commonly known as:  WEST-CORT  Apply 1 application topically 2 (two) times daily.     levothyroxine 150 MCG tablet  Commonly known as:  SYNTHROID, LEVOTHROID  Take 150 mcg by mouth daily before breakfast.     levothyroxine 25 MCG tablet  Commonly known as:  SYNTHROID, LEVOTHROID  Take 1 tablet (25 mcg total) by mouth daily before breakfast.  Start taking on:  02/12/2014     lisinopril 10 MG tablet  Commonly known as:  PRINIVIL,ZESTRIL  Take 10 mg by mouth daily.     metFORMIN 500 MG tablet  Commonly known as:  GLUCOPHAGE    Take 2 tablets (1,000 mg total) by mouth 2 (two) times daily with a meal.     metoprolol succinate 50 MG 24 hr tablet  Commonly known as:  TOPROL-XL  Take 50 mg by mouth daily. Take with or immediately following a meal.     metoprolol succinate 50 MG 24 hr tablet  Commonly known as:  TOPROL-XL  Take 1 tablet (50 mg total) by mouth daily.     NASACORT AQ 55 MCG/ACT Aero nasal inhaler  Generic drug:  triamcinolone  Place 2 sprays into the nose daily.     nitroGLYCERIN 0.4 MG SL tablet  Commonly known as:  NITROSTAT  Place 0.4 mg under the tongue every 5 (five) minutes as needed for chest pain.     omeprazole 40 MG capsule  Commonly known as:  PRILOSEC  Take 40 mg by mouth daily.     sertraline 50 MG tablet  Commonly known as:  ZOLOFT  Take 150 mg by mouth daily.        Disposition and follow-up:   [name] was discharged from [facility] in Stable condition.  At the hospital follow up visit please address:  1.  Her MRI showed chronic/old infarct of right parietal lobe. This will not explain her vertigo or leg "giving away". I doubt her recent leg issue was TIA. This is likely just knee pain/back pain related.  However, to prevent future CVA, should optimize therapy for DM II (increased metformin to 1081m bid), continue same HTN regimen as it's well controlled, continue statin for HLD, asked to quit smoking. Continue asa 863mdaily. Did not consult neurology as it didn't seem to be required this admission. If desired, this can be arranged outpatient.  Increased synthyroid to 17553motal (150 mcg ab + 81m33mab) daily as TSH was high despite being on synthyoid (did state missing 5 days but that's not enough to increase TSH that much).  Also complained of restless leg around time of discharge, checked ferritin, it can be followed outpatient and can start therapy for restless if appropriate.    If vestibular therapy desired for likely BPPV, please arrange  outpatient.  2.  Labs / imaging needed at time of follow-up:    3.  Pending labs/ test needing follow-up: Ferritin  Follow-up Appointments:     Follow-up Information    Follow up with SadeClinton Adams.   Specialty:  Internal Medicine   Contact information:   [phone]   Discharge Instructions:   Consultations:    Procedures Performed:  Dg Lumbar Spine Complete  02/10/2014   CLINICAL DATA:  Initial evaluation for low back pain with fall 6 days ago, personal history of chronic back pain  EXAM: LUMBAR SPINE - COMPLETE 4+ VIEW  COMPARISON:  April 08, 2012  FINDINGS: Large bilateral bridging osteophytes throughout the lumbar spine. Normal anterior-posterior alignment. No fracture. Degenerative facet changes at every level through the lumbar spine.  IMPRESSION: Prominent osteophyte formation with relative maintenance of disc space throughout the lumbar spine. Osteophytes are progressively enlarged when compared to prior study. Significant degenerative facet change is stable.   Electronically Signed   By: RaymSkipper Cliche.   On: 02/10/2014 20:26   Mr [name]  02/11/2014   CLINICAL DATA:  Vertigo. No neurologic deficit. History of hypertension and diabetes.  EXAM: MRI HEAD WITHOUT AND WITH CONTRAST  TECHNIQUE: Multiplanar, multiecho pulse sequences of the brain and surrounding structures were obtained without and with intravenous contrast.  CONTRAST:  MultiHance 20 mL.  COMPARISON:  CT head 11/12/2009.  FINDINGS: No evidence for acute infarction, hemorrhage, mass lesion, hydrocephalus, or extra-axial fluid. Chronic RIGHT parietal infarct with gliosis and encephalomalacia. Trace mineralization. Otherwise normal for age cerebral volume. Mild subcortical and periventricular T2 and FLAIR hyperintensities, likely chronic microvascular ischemic change.  Pituitary, pineal, and cerebellar tonsils unremarkable. No upper cervical lesions. Flow  voids are maintained in the major intracranial vessels. Visualized calvarium, skull base, and upper cervical osseous structures unremarkable. Scalp and extracranial soft tissues, orbits, sinuses, and mastoids show no acute process.  Post infusion, no abnormal enhancement of the brain or meninges. Within limits of detection on routine brain MR, no visible vestibular schwannoma or posterior fossa mass.  IMPRESSION: Chronic RIGHT parietal infarct. No acute intracranial findings. No abnormal postcontrast enhancement. No obvious acute or focal temporal bone abnormality.   Electronically Signed   By: JohnRolla Flatten.   On: 02/11/2014 12:00    2D Echo:  Cardiac Cath:   Admission HPI:   53 y46female with CAD, MI, Asthma, COPD, HTN, DM II, chronic back pain here with  vertigo and LBP and leg pain. She was admitted from the clinic. She has been having vertigo when she turns her head towards right for a long time. Has on going hearing deficit but denies ringing in the ears. Also had 1 episode of when she was walking last week when her right leg "gave away" for few secs then also left leg "gave away" for few seconds and got back to normal. She didn't lose her posture or fall. No other neuro symptoms during that time. Does have chronic LBP and leg pain. Had fell and hit her back several times in the past. Also has chronic b/l knee pain. When last fell few days ago, the episode of leg giving away happened few days after the fall. Never went to ED after the falls because of not having insurance. At one point she even used a walker that she borrowed from someone for few weeks but did not see a doctor for it. Denies any change in vision. Has stress urinary incontinence with coughing, no loss of bowel control.   She does feel dizzy when getting up. States not drinking enough water. This is in addition to her vertigo with head turning tot he right.   She fell on her chest once in the past. Had some sharp pain yesterday  lasting about 10 secs and relieving without radiation. Had pain in the chest in the past from injury. Has hx of chronic exertional chest pain, on nitro, unchanged. Follows with Dr. Meda Coffee for cardiology. nuc test on 10/19/13 showed fixed inferior, apical, and anterior attenuation, poor exercise capacity.   Smokes e-cig and THC. Occasional alcohol use. States compliance with her meds.  Hospital Course by problem list: Active Problems:   Vertigo    53 yo with CAD, CAD s/p bms 2x RCA 2009, HLD, smoker, DM II, HTN, chornic back pain, depression comes in with vertigo, and chronic leg/back pain with recent fall.  Vertigo - unilateral, only with position changes to right sided head turning, neuro intact otherwise, able to ambulate without problem. Likely peripheral vertigo rather than central. No tinnitus. Does have some chronic hearing deficit per patient. Could be meniere's or BPPV. It is under control.  - negative orthostats. Did MRI head which shows chronic right parietal infarct but this will not explain her vertigo or the leg weakness she had. - PT hasn't evaluated patient here as they came when she was on MRI. I didn't want to keep her here longer just for this. May consider doing vestibular PT outpatient for likely BPPV as patient is desiring to leave today.   Chronic right parietal stroke - is on asa. Not sure if neuro consult is warranted. Main thing will be risk reduction - cont asa 68m daily - Control diabetes and HTN as below, is on statin for HLD. - encouraged smoking cessation  Chronic back pain and leg pain with one episode of legs giving away for few secs but didn't fall or have any other neuro deficits. - I don't think she has any weakness on exam and the episode of leg giving away was just from knee pain and back pain rather than TIA or stroke. - had xray of lumbar spine previously which didn't show anything abnormality 2014. Did fall 1 week ago and did have some feeling of leg  giving away transiently. Continuing to have hip pain and leg pain. No neuro deficits, fever, loss of bladder or bowel. - repeat xray lumbar spine shows severe degenerative changes. -  mri shows chronic right parietal infarct. This will not explain her leg weakness/giving away though. - may refer her to sports medicine for pain control if desired outpatient  Hypothyroidism - patient was taking 152mg synthyroid at home. Has been compliant. But TSH 10.630.  - will increase synthyroid to 1778m.   Chronic exertional chest pain. Hx of MI and CAD. already followed by Dr. NeMeda Coffeeutpatient. Recent nuc stress test shows low prob - not having CP currently so will not do cardiac workup for now unless he has CP. Does have 2 bare metal stents on right circ since 2009. Also DM, HTN, smoker, so high risk for MI. - cont nitro PRN exertional chest pain. - cont asa. Cont lisinopril 1031maily, metoprolol 68m19mily.  DM II - takes metformin at home 500mg63m. hgba1c 7.9 last 02/10/14.  - cont SSI here. Will increase metformin to 1000mg 22mgoing home as hgba1c increased from 04/2013 (7.3)  HTN - under control - cont metoprolol 68mg d10m, lisinopril 10mg da17m   COPD - cont dulera and PRN albuterol. Back to home inhalers on discharge. Allergic rhinitis - cont flonase.  HLD - cont lipitor  Depression - zoloft.   Discharge Vitals:   BP 102/59 mmHg  Pulse 62  Temp(Src) 98.1 F (36.7 C) (Oral)  Resp 18  Ht 5' 3"  (1.6 m)  Wt 250 lb 3.6 oz (113.5 kg)  BMI 44.34 kg/m2  SpO2 96%  Discharge Labs:  Results for orders placed or performed during the hospital encounter of 02/10/14 (from the past 24 hour(s))  Glucose, capillary     Status: Abnormal   Collection Time: 02/10/14  9:28 PM  Result Value Ref Range   Glucose-Capillary 143 (H) 70 - 99 mg/dL   Comment 1 Documented in Chart    Comment 2 Notify RN   CBC     Status: Abnormal   Collection Time: 02/10/14 10:37 PM  Result Value Ref Range   WBC  11.1 (H) 4.0 - 10.5 K/uL   RBC 4.62 3.87 - 5.11 MIL/uL   Hemoglobin 14.3 12.0 - 15.0 g/dL   HCT 42.6 36.0 - 46.0 %   MCV 92.2 78.0 - 100.0 fL   MCH 31.0 26.0 - 34.0 pg   MCHC 33.6 30.0 - 36.0 g/dL   RDW 13.6 11.5 - 15.5 %   Platelets 250 150 - 400 K/uL  Comprehensive metabolic panel     Status: Abnormal   Collection Time: 02/10/14 10:37 PM  Result Value Ref Range   Sodium 140 137 - 147 mEq/L   Potassium 4.0 3.7 - 5.3 mEq/L   Chloride 101 96 - 112 mEq/L   CO2 28 19 - 32 mEq/L   Glucose, Bld 160 (H) 70 - 99 mg/dL   BUN 10 6 - 23 mg/dL   Creatinine, Ser 0.68 0.50 - 1.10 mg/dL   Calcium 9.9 8.4 - 10.5 mg/dL   Total Protein 6.8 6.0 - 8.3 g/dL   Albumin 3.7 3.5 - 5.2 g/dL   AST 23 0 - 37 U/L   ALT 24 0 - 35 U/L   Alkaline Phosphatase 112 39 - 117 U/L   Total Bilirubin 0.4 0.3 - 1.2 mg/dL   GFR calc non Af Amer >90 >90 mL/min   GFR calc Af Amer >90 >90 mL/min   Anion gap 11 5 - 15  TSH     Status: Abnormal   Collection Time: 02/10/14 10:37 PM  Result Value Ref Range   TSH 10.630 (H)  0.350 - 4.500 uIU/mL  Glucose, capillary     Status: Abnormal   Collection Time: 02/11/14  5:44 AM  Result Value Ref Range   Glucose-Capillary 183 (H) 70 - 99 mg/dL   Comment 1 Documented in Chart    Comment 2 Notify RN   Lipid panel     Status: Abnormal   Collection Time: 02/11/14  8:52 AM  Result Value Ref Range   Cholesterol 152 0 - 200 mg/dL   Triglycerides 141 <150 mg/dL   HDL 28 (L) >39 mg/dL   Total CHOL/HDL Ratio 5.4 RATIO   VLDL 28 0 - 40 mg/dL   LDL Cholesterol 96 0 - 99 mg/dL  Glucose, capillary     Status: Abnormal   Collection Time: 02/11/14 11:33 AM  Result Value Ref Range   Glucose-Capillary 152 (H) 70 - 99 mg/dL   Comment 1 Documented in Chart     Signed: Dellia Nims, MD 02/11/2014, 4:48 PM    Services Ordered on Discharge:  Equipment Ordered on Discharge:

## 2014-02-11 NOTE — Discharge Instructions (Addendum)
You were admitted for vertigo and leg weakness. MRI shows chronic (old) stroke but this does not explain your symptom. You should follow up closely outpatient. We checked your ferritin for restless leg syndrome and this can be followed outpatient.  Increase your metformin to 500 mg twice a day. Increase your synthyroid medicine to total 140mg daily. (take your 1532m tablet AND also 2519mtablet to make it total 175m29m Follow up closely with your doctor for other chronic problems.

## 2014-02-12 LAB — FERRITIN: Ferritin: 31 ng/mL (ref 10–291)

## 2014-02-13 ENCOUNTER — Encounter: Payer: Self-pay | Admitting: Dietician

## 2014-02-13 ENCOUNTER — Encounter: Payer: Self-pay | Admitting: *Deleted

## 2014-02-13 NOTE — Progress Notes (Signed)
INTERNAL MEDICINE TEACHING ATTENDING ADDENDUM - Aldine Contes, MD: I reviewed and discussed at the time of visit with the resident Dr. Algis Liming, the patient's medical history, physical examination, diagnosis and results of pertinent tests and treatment and I agree with the patient's care as documented. - Pt to be admitted for evaluation of possible TIA

## 2014-02-13 NOTE — Progress Notes (Signed)
Retinal images done and transmitted.

## 2014-02-27 ENCOUNTER — Ambulatory Visit: Payer: Self-pay | Admitting: Internal Medicine

## 2014-02-27 ENCOUNTER — Ambulatory Visit (INDEPENDENT_AMBULATORY_CARE_PROVIDER_SITE_OTHER): Payer: Self-pay | Admitting: Internal Medicine

## 2014-02-27 ENCOUNTER — Encounter: Payer: Self-pay | Admitting: Internal Medicine

## 2014-02-27 VITALS — BP 126/66 | HR 79 | Temp 97.9°F | Ht 63.5 in | Wt 258.0 lb

## 2014-02-27 DIAGNOSIS — R42 Dizziness and giddiness: Secondary | ICD-10-CM

## 2014-02-27 DIAGNOSIS — E1165 Type 2 diabetes mellitus with hyperglycemia: Secondary | ICD-10-CM

## 2014-02-27 DIAGNOSIS — E118 Type 2 diabetes mellitus with unspecified complications: Secondary | ICD-10-CM

## 2014-02-27 DIAGNOSIS — F17210 Nicotine dependence, cigarettes, uncomplicated: Secondary | ICD-10-CM

## 2014-02-27 DIAGNOSIS — I10 Essential (primary) hypertension: Secondary | ICD-10-CM

## 2014-02-27 DIAGNOSIS — M79673 Pain in unspecified foot: Secondary | ICD-10-CM

## 2014-02-27 DIAGNOSIS — E039 Hypothyroidism, unspecified: Secondary | ICD-10-CM

## 2014-02-27 LAB — GLUCOSE, CAPILLARY: Glucose-Capillary: 176 mg/dL — ABNORMAL HIGH (ref 70–99)

## 2014-02-27 MED ORDER — IRON 325 (65 FE) MG PO TABS: 325.0000 mg | ORAL_TABLET | Freq: Every day | ORAL | Status: DC

## 2014-02-27 MED ORDER — CYCLOBENZAPRINE HCL 10 MG PO TABS: 10.0000 mg | ORAL_TABLET | Freq: Three times a day (TID) | ORAL | Status: DC | PRN

## 2014-02-27 NOTE — Progress Notes (Signed)
Internal Medicine Clinic Attending  Case discussed with Dr. Emokpae soon after the resident saw the patient.  We reviewed the resident's history and exam and pertinent patient test results.  I agree with the assessment, diagnosis, and plan of care documented in the resident's note. 

## 2014-02-27 NOTE — Progress Notes (Signed)
Patient ID: Kendra Adams, female   DOB: 08-23-1960, 53 y.o.   MRN: [id number]   Subjective:   Patient ID: Kendra Adams female   DOB: 11-11-1960 53 y.o.   MRN: [id number]  HPI: [name] is a 53 y.o. with PMh listed below. Presented today for hospital follow up visit. Pt was admitted- 12/4 to 12/5. Pt was managed of vertigo. Pt has been taking an over the counter medication which she says has helped but she does not know the name. Pt says she has been having cramps in her lower extremity- the right lower extremity. Worse in the evening, intemittent. She denies burning. She also was told that her ferritin was low, Says she has had a colonscopy done about 5 years ago, 2 polyps were removed, and was told to repeat it 5 years which will be about now. No dark blood in stools.   Past Medical History  Diagnosis Date  . Carpal tunnel syndrome on both sides     right hand surgery 1998, left hand surgery 1999.  . Allergic rhinitis   . Asthma   . Depression   . GERD (gastroesophageal reflux disease)   . Hyperlipidemia   . Hypothyroidism   . Psoriasis   . CAD (coronary artery disease)     cath 12/13/10: mRCA 99%, EF 65-70%;  s/p BMS to mRCA  . HTN (hypertension)   . DM2 (diabetes mellitus, type 2)   . Hyperlipidemia   . MI (myocardial infarction)    Current Outpatient Prescriptions  Medication Sig Dispense Refill  . acetaminophen (TYLENOL) 325 MG tablet Take 650 mg by mouth every 4 (four) hours as needed for mild pain.    Marland Kitchen albuterol (PROVENTIL HFA;VENTOLIN HFA) 108 (90 BASE) MCG/ACT inhaler Inhale 2 puffs into the lungs every 6 (six) hours as needed for wheezing or shortness of breath.    Marland Kitchen aspirin 81 MG chewable tablet Chew 81 mg by mouth daily.    Marland Kitchen atorvastatin (LIPITOR) 80 MG tablet Take 80 mg by mouth daily.     . chlorpheniramine-HYDROcodone (TUSSIONEX PENNKINETIC ER) 10-8 MG/5ML LQCR Take 5 mLs by mouth every 12 (twelve) hours as needed for cough (Cough). 115 mL 0  .  Fluticasone-Salmeterol (ADVAIR) 100-50 MCG/DOSE AEPB Inhale 2 puffs into the lungs 2 (two) times daily.    . hydrocortisone valerate ointment (WEST-CORT) 0.2 % Apply 1 application topically 2 (two) times daily.    Marland Kitchen levothyroxine (SYNTHROID, LEVOTHROID) 150 MCG tablet Take 150 mcg by mouth daily before breakfast.    . levothyroxine (SYNTHROID, LEVOTHROID) 25 MCG tablet Take 1 tablet (25 mcg total) by mouth daily before breakfast. 30 tablet 1  . lisinopril (PRINIVIL,ZESTRIL) 10 MG tablet Take 10 mg by mouth daily.    . metFORMIN (GLUCOPHAGE) 500 MG tablet Take 2 tablets (1,000 mg total) by mouth 2 (two) times daily with a meal. 90 tablet 1  . metoprolol succinate (TOPROL-XL) 50 MG 24 hr tablet Take 50 mg by mouth daily. Take with or immediately following a meal.    . metoprolol succinate (TOPROL-XL) 50 MG 24 hr tablet Take 1 tablet (50 mg total) by mouth daily. (Patient not taking: Reported on 02/10/2014) 30 tablet 5  . nitroGLYCERIN (NITROSTAT) 0.4 MG SL tablet Place 0.4 mg under the tongue every 5 (five) minutes as needed for chest pain.    . Omega-3 Fatty Acids (FISH OIL) 1000 MG CAPS Take 1 capsule by mouth daily.    Marland Kitchen omeprazole (PRILOSEC) 40 MG capsule  Take 40 mg by mouth daily.    . sertraline (ZOLOFT) 50 MG tablet Take 150 mg by mouth daily.    Marland Kitchen triamcinolone (NASACORT AQ) 55 MCG/ACT AERO nasal inhaler Place 2 sprays into the nose daily.     No current facility-administered medications for this visit.   Family History  Problem Relation Age of Onset  . Diabetes Mother   . Hypertension Mother   . Diabetes Father   . Hypertension Father    History   Social History  . Marital Status: Married    Spouse Name: N/A    Number of Children: 0  . Years of Education: 10th grade   Occupational History  . Conservation officer, nature     self-employed  . 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 Smoking status: Current Every Day Smoker -- 0.50 packs/day for 35 years    Types: Cigarettes   . Smokeless tobacco: Current User     Comment: Quit x 1 week.  Does 4 puffs and then again then done.  Has an E-cigerrette  . Alcohol Use: No  . Drug Use: No  . Sexual Activity: None   Other Topics Concern  . None   Social History Narrative   Father died of suicide (gunshot) in 1996/05/13. 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       Patient lives with her husband, her husband's niece Tammy who is mentally retarded and her best friend and the friend's daughter and 6 dogs               Review of Systems: CONSTITUTIONAL- No Fever, weightloss, night sweat or change in appetite. SKIN- No Rash, colour changes or itching. HEAD- No Headache or dizziness. Mouth/throat- No Sorethroat, dentures, or bleeding gums. RESPIRATORY- No Cough or SOB. CARDIAC- No Palpitations, chest pain. GI- No nausea, vomiting, diarrhoea, constipation, abd pain. URINARY- No Frequency, urgency, straining or dysuria. NEUROLOGIC- No Numbness, syncope, seizures or burning, but has chronic back pain.  Objective:  Physical Exam: Filed Vitals:   02/27/14 1414  BP: 126/66  Pulse: 79  Temp: 97.9 F (36.6 C)  TempSrc: Oral  Height: 5' 3.5" (1.613 m)  Weight: 258 lb (117.028 kg)  SpO2: 95%   GENERAL- alert, co-operative, appears as stated age, not in any distress. HEENT- Atraumatic, normocephalic, PERRL, EOMI, oral mucosa appears moist, neck supple. CARDIAC- RRR, no murmurs, rubs or gallops. RESP- Moving equal volumes of air, no wheezes or crackles. ABDOMEN- Soft, nontender, no guarding or rebound, bowel sounds present. BACK- Normal curvature of the spine, No tenderness along the vertebrae. NEURO- No obvious Cr N abnormality, strenght upper and lower extremities- intact, Gait- Normal. EXTREMITIES-Warm and well perfused, no pedal edema. SKIN- Warm, dry, No rash or lesion. PSYCH- Normal mood and affect, appropriate thought  content and speech.  Assessment & Plan:   The patient's case and plan of care was discussed with attending physician, Dr. Ellwood Dense.   Please see problem based charting for assessment and plan.

## 2014-02-27 NOTE — Patient Instructions (Signed)
General Instructions:  We will be prescribing muscle relaxant for your also cramps.  We will be giving you some stool cards, please send the cards in when you have used the cards.  Also take iron tablets once a day, as you iron levels are low. You can take an overthe caunter stool softner to help if you develop consitipation.   Also we will like to get records form the doctor who did your colonsocopy.    Please bring your medicines with you each time you come to clinic.  Medicines may include prescription medications, over-the-counter medications, herbal remedies, eye drops, vitamins, or other pills.

## 2014-02-28 NOTE — Assessment & Plan Note (Signed)
Currently on 175Ug of Synthroid daily. No symptoms of hypothyroidsm. 02/10/2013- Last TSH- 10.63, while she was on 179m. TSH was subsequently increased.   Plan- TSH next visit.

## 2014-02-28 NOTE — Assessment & Plan Note (Signed)
Pt complaining of cramping pain in right foot only present at night, denies burning. This started while on admission- differentials- Cramps, Restless legs, diabetic neuropathy- typically bilat. Ferritin on admission- low at 31, was checked 2 years prior- low at 24. CBC- Hgb stable at 13.9- 14.5. Last colonoscopy- 5 years ago, to repeat about now.  Plan- Obtain records from Gastroenterologist for colonoscopy, and pending results patient might be due for next. - Considering low ferritin, Fe tabs- 337m daily. With stool softners as needed.  - Flexeril- 168mTID as needed for cramps. (Pt said she was given a tablet form a friend and this helped). - Consider addition/trial of gabapetin if no improvement, might be a manifestation of early diabetic neuropathy.

## 2014-02-28 NOTE — Assessment & Plan Note (Signed)
BP Readings from Last 3 Encounters:  02/27/14 126/66  02/11/14 102/59  02/10/14 115/77    Lab Results  Component Value Date   NA 140 02/10/2014   K 4.0 02/10/2014   CREATININE 0.68 02/10/2014    Assessment: Blood pressure control:  Controlled Progress toward BP goal:   At goal Comments: On Metoprolol XR- 80m daily and Lisinopril- 120mdaily  Plan: Medications:  continue current medications Educational resources provided: brochure, handout, video Self management tools provided:   Other plans:

## 2014-02-28 NOTE — Assessment & Plan Note (Signed)
Lab Results  Component Value Date   HGBA1C 7.9 02/10/2014   HGBA1C 7.3 04/22/2013   HGBA1C 7.3 11/19/2012     Assessment: Diabetes control:  uncontrolled Progress toward A1C goal:   Not at goal Comments: meds just increased 2 weeks ago, on admmision to 1023m BID of metformin which pt is tolerating and compliant with.  Plan: Medications:  continue current medications Home glucose monitoring: Frequency:   Timing:   Instruction/counseling given: reminded to bring medications to each visit, discussed the need for weight loss and discussed diet Educational resources provided: brochure, handout Self management tools provided:   Other plans: Might require another oral hypoglycemic agent if metformin alone is not effective.

## 2014-02-28 NOTE — Assessment & Plan Note (Signed)
Requiring admission, work up negative, Thought to be due to BPPV . Improved with OTC medication. Did not under go physical therapy prior to discharge, as pt was eager to leave. If recurring vertigo might benefit from vestibular rehab. Patient has no insurance, besides symptoms appear to have resolved.

## 2014-03-25 ENCOUNTER — Other Ambulatory Visit: Payer: Self-pay | Admitting: Internal Medicine

## 2014-03-27 ENCOUNTER — Other Ambulatory Visit: Payer: Self-pay | Admitting: Internal Medicine

## 2014-03-29 ENCOUNTER — Other Ambulatory Visit: Payer: Self-pay | Admitting: *Deleted

## 2014-03-29 MED ORDER — METFORMIN HCL 1000 MG PO TABS: ORAL_TABLET | ORAL | Status: DC

## 2014-03-29 NOTE — Telephone Encounter (Signed)
Can pharmacy change to Metformin 1,000 mg tabs take 1 bid with meal?  # 60.   If you change to this the med is FREE !

## 2014-04-05 ENCOUNTER — Other Ambulatory Visit: Payer: Self-pay | Admitting: *Deleted

## 2014-04-05 MED ORDER — NITROGLYCERIN 0.4 MG SL SUBL: 0.4000 mg | SUBLINGUAL_TABLET | SUBLINGUAL | Status: DC | PRN

## 2014-04-14 ENCOUNTER — Other Ambulatory Visit: Payer: Self-pay | Admitting: Internal Medicine

## 2014-05-05 ENCOUNTER — Other Ambulatory Visit: Payer: Self-pay | Admitting: Cardiology

## 2014-05-05 ENCOUNTER — Other Ambulatory Visit: Payer: Self-pay | Admitting: Internal Medicine

## 2014-05-15 ENCOUNTER — Encounter: Payer: Self-pay | Admitting: *Deleted

## 2014-05-19 ENCOUNTER — Encounter (HOSPITAL_COMMUNITY): Payer: Self-pay | Admitting: *Deleted

## 2014-05-19 ENCOUNTER — Emergency Department (HOSPITAL_COMMUNITY): Payer: Self-pay

## 2014-05-19 ENCOUNTER — Ambulatory Visit (INDEPENDENT_AMBULATORY_CARE_PROVIDER_SITE_OTHER): Payer: Self-pay | Admitting: Internal Medicine

## 2014-05-19 ENCOUNTER — Encounter: Payer: Self-pay | Admitting: Internal Medicine

## 2014-05-19 VITALS — BP 122/66 | HR 72 | Temp 98.4°F | Ht 63.0 in | Wt 153.6 lb

## 2014-05-19 DIAGNOSIS — E118 Type 2 diabetes mellitus with unspecified complications: Secondary | ICD-10-CM

## 2014-05-19 DIAGNOSIS — I252 Old myocardial infarction: Secondary | ICD-10-CM | POA: Insufficient documentation

## 2014-05-19 DIAGNOSIS — Z720 Tobacco use: Secondary | ICD-10-CM | POA: Insufficient documentation

## 2014-05-19 DIAGNOSIS — I2510 Atherosclerotic heart disease of native coronary artery without angina pectoris: Secondary | ICD-10-CM | POA: Insufficient documentation

## 2014-05-19 DIAGNOSIS — Z7982 Long term (current) use of aspirin: Secondary | ICD-10-CM | POA: Insufficient documentation

## 2014-05-19 DIAGNOSIS — R110 Nausea: Secondary | ICD-10-CM | POA: Insufficient documentation

## 2014-05-19 DIAGNOSIS — H5500 Unspecified nystagmus: Secondary | ICD-10-CM | POA: Insufficient documentation

## 2014-05-19 DIAGNOSIS — E039 Hypothyroidism, unspecified: Secondary | ICD-10-CM | POA: Insufficient documentation

## 2014-05-19 DIAGNOSIS — R42 Dizziness and giddiness: Secondary | ICD-10-CM | POA: Insufficient documentation

## 2014-05-19 DIAGNOSIS — E785 Hyperlipidemia, unspecified: Secondary | ICD-10-CM | POA: Insufficient documentation

## 2014-05-19 DIAGNOSIS — Z7951 Long term (current) use of inhaled steroids: Secondary | ICD-10-CM | POA: Insufficient documentation

## 2014-05-19 DIAGNOSIS — R350 Frequency of micturition: Secondary | ICD-10-CM

## 2014-05-19 DIAGNOSIS — J069 Acute upper respiratory infection, unspecified: Secondary | ICD-10-CM

## 2014-05-19 DIAGNOSIS — J45901 Unspecified asthma with (acute) exacerbation: Secondary | ICD-10-CM | POA: Insufficient documentation

## 2014-05-19 DIAGNOSIS — Z872 Personal history of diseases of the skin and subcutaneous tissue: Secondary | ICD-10-CM | POA: Insufficient documentation

## 2014-05-19 DIAGNOSIS — I10 Essential (primary) hypertension: Secondary | ICD-10-CM | POA: Insufficient documentation

## 2014-05-19 DIAGNOSIS — K219 Gastro-esophageal reflux disease without esophagitis: Secondary | ICD-10-CM | POA: Insufficient documentation

## 2014-05-19 DIAGNOSIS — Z79899 Other long term (current) drug therapy: Secondary | ICD-10-CM | POA: Insufficient documentation

## 2014-05-19 DIAGNOSIS — E119 Type 2 diabetes mellitus without complications: Secondary | ICD-10-CM | POA: Insufficient documentation

## 2014-05-19 LAB — CBC WITH DIFFERENTIAL/PLATELET
Basophils Absolute: 0 10*3/uL (ref 0.0–0.1)
Basophils Relative: 0 % (ref 0–1)
Eosinophils Absolute: 0.2 10*3/uL (ref 0.0–0.7)
Eosinophils Relative: 2 % (ref 0–5)
HCT: 42 % (ref 36.0–46.0)
Hemoglobin: 14.1 g/dL (ref 12.0–15.0)
Lymphocytes Relative: 12 % (ref 12–46)
Lymphs Abs: 0.9 10*3/uL (ref 0.7–4.0)
MCH: 30.3 pg (ref 26.0–34.0)
MCHC: 33.6 g/dL (ref 30.0–36.0)
MCV: 90.1 fL (ref 78.0–100.0)
Monocytes Absolute: 0.7 10*3/uL (ref 0.1–1.0)
Monocytes Relative: 9 % (ref 3–12)
Neutro Abs: 6 10*3/uL (ref 1.7–7.7)
Neutrophils Relative %: 77 % (ref 43–77)
Platelets: 192 10*3/uL (ref 150–400)
RBC: 4.66 MIL/uL (ref 3.87–5.11)
RDW: 14 % (ref 11.5–15.5)
WBC: 7.8 10*3/uL (ref 4.0–10.5)

## 2014-05-19 LAB — POCT GLYCOSYLATED HEMOGLOBIN (HGB A1C): Hemoglobin A1C: 7.9

## 2014-05-19 LAB — COMPREHENSIVE METABOLIC PANEL
ALT: 49 U/L — ABNORMAL HIGH (ref 0–35)
AST: 58 U/L — ABNORMAL HIGH (ref 0–37)
Albumin: 3.7 g/dL (ref 3.5–5.2)
Alkaline Phosphatase: 111 U/L (ref 39–117)
Anion gap: 12 (ref 5–15)
BUN: 8 mg/dL (ref 6–23)
CO2: 24 mmol/L (ref 19–32)
Calcium: 9.3 mg/dL (ref 8.4–10.5)
Chloride: 102 mmol/L (ref 96–112)
Creatinine, Ser: 0.75 mg/dL (ref 0.50–1.10)
GFR calc Af Amer: >90 mL/min (ref >90)
GFR calc non Af Amer: >90 mL/min (ref >90)
Glucose, Bld: 190 mg/dL — ABNORMAL HIGH (ref 70–99)
Potassium: 3.9 mmol/L (ref 3.5–5.1)
Sodium: 138 mmol/L (ref 135–145)
Total Bilirubin: 0.5 mg/dL (ref 0.3–1.2)
Total Protein: 7 g/dL (ref 6.0–8.3)

## 2014-05-19 LAB — GLUCOSE, CAPILLARY: Glucose-Capillary: 231 mg/dL — ABNORMAL HIGH (ref 70–99)

## 2014-05-19 MED ORDER — ALBUTEROL SULFATE (2.5 MG/3ML) 0.083% IN NEBU
2.5000 mg | INHALATION_SOLUTION | Freq: Once | RESPIRATORY_TRACT | Status: AC
  Administered 2014-05-19: 2.5 mg via RESPIRATORY_TRACT

## 2014-05-19 MED ORDER — GUAIFENESIN-CODEINE 100-10 MG/5ML PO SYRP: 5.0000 mL | ORAL_SOLUTION | Freq: Three times a day (TID) | ORAL | Status: DC | PRN

## 2014-05-19 MED ORDER — ALBUTEROL SULFATE HFA 108 (90 BASE) MCG/ACT IN AERS: 2.0000 | INHALATION_SPRAY | Freq: Four times a day (QID) | RESPIRATORY_TRACT | Status: DC | PRN

## 2014-05-19 NOTE — Progress Notes (Signed)
Subjective:   Patient ID: Kendra Adams female   DOB: 19-Aug-1960 54 y.o.   MRN: [id number]  HPI: [name] is a 54 y.o. woman with a past medical history as listed below who presents for ongoing cough.  The patient complains of congestion, sneezing, sore throat, post nasal drip, dry cough and headache for 4 days. She denies a history of anorexia, chest pain, chills, fevers, myalgias, nausea, sweats and vomiting and has a history of asthma. Patient does smoke cigarettes. She had a recent sick contact with similar symptoms and began having onset within one to 2 days of contact with the person. She has tried over-the-counter NyQuil and Tylenol with significant relief. She states that today she is much better than she has been. She did not have her albuterol inhaler available and did feel intermittently wheezy. Pt has also not taken any of her medications today.   Her other concern is some recent increased urinary frequency and incontinence which are similar to her previous UTIs. She denies any abdominal pain, pyuria, fever, or hematuria.    Past Medical History  Diagnosis Date  . Carpal tunnel syndrome on both sides     right hand surgery 1998, left hand surgery 1999.  . Allergic rhinitis   . Asthma   . Depression   . GERD (gastroesophageal reflux disease)   . Hyperlipidemia   . Hypothyroidism   . Psoriasis   . CAD (coronary artery disease)     cath 12/13/10: mRCA 99%, EF 65-70%;  s/p BMS to mRCA  . HTN (hypertension)   . DM2 (diabetes mellitus, type 2)   . Hyperlipidemia   . MI (myocardial infarction)    Current Outpatient Prescriptions  Medication Sig Dispense Refill  . acetaminophen (TYLENOL) 325 MG tablet Take 650 mg by mouth every 4 (four) hours as needed for mild pain.    Marland Kitchen albuterol (PROVENTIL HFA;VENTOLIN HFA) 108 (90 BASE) MCG/ACT inhaler Inhale 2 puffs into the lungs every 6 (six) hours as needed for wheezing or shortness of breath.    Marland Kitchen aspirin 81 MG chewable tablet  Chew 81 mg by mouth daily.    Marland Kitchen atorvastatin (LIPITOR) 80 MG tablet Take 80 mg by mouth daily.     . chlorpheniramine-HYDROcodone (TUSSIONEX PENNKINETIC ER) 10-8 MG/5ML LQCR Take 5 mLs by mouth every 12 (twelve) hours as needed for cough (Cough). 115 mL 0  . cyclobenzaprine (FLEXERIL) 10 MG tablet TAKE 1 TABLET (10 MG TOTAL) BY MOUTH 3 (THREE) TIMES DAILY AS NEEDED FOR MUSCLE SPASMS. 30 tablet 0  . Ferrous Sulfate (IRON) 325 (65 FE) MG TABS Take 325 mg by mouth daily. 30 each 0  . Fluticasone-Salmeterol (ADVAIR) 100-50 MCG/DOSE AEPB Inhale 2 puffs into the lungs 2 (two) times daily.    . hydrocortisone valerate ointment (WEST-CORT) 0.2 % Apply 1 application topically 2 (two) times daily.    Marland Kitchen levothyroxine (SYNTHROID, LEVOTHROID) 150 MCG tablet Take 150 mcg by mouth daily before breakfast.    . levothyroxine (SYNTHROID, LEVOTHROID) 150 MCG tablet TAKE 1 TABLET BY MOUTH DAILY BEFORE BREAKFAST. 30 tablet 11  . lisinopril (PRINIVIL,ZESTRIL) 10 MG tablet Take 10 mg by mouth daily.    . metFORMIN (GLUCOPHAGE) 1000 MG tablet Take 1 tablet twice a day 60 tablet 2  . metoprolol succinate (TOPROL-XL) 50 MG 24 hr tablet Take 50 mg by mouth daily. Take with or immediately following a meal.    . metoprolol succinate (TOPROL-XL) 50 MG 24 hr tablet TAKE 1 TABLET  BY MOUTH DAILY. 30 tablet 4  . nitroGLYCERIN (NITROSTAT) 0.4 MG SL tablet Place 1 tablet (0.4 mg total) under the tongue every 5 (five) minutes as needed for chest pain. 25 tablet 3  . Omega-3 Fatty Acids (FISH OIL) 1000 MG CAPS Take 1 capsule by mouth daily.    Marland Kitchen omeprazole (PRILOSEC) 40 MG capsule Take 40 mg by mouth daily.    . sertraline (ZOLOFT) 50 MG tablet Take 150 mg by mouth daily.    Marland Kitchen triamcinolone (NASACORT AQ) 55 MCG/ACT AERO nasal inhaler Place 2 sprays into the nose daily.     No current facility-administered medications for this visit.   Family History  Problem Relation Age of Onset  . Diabetes Mother   . Hypertension Mother   .  Diabetes Father   . Hypertension Father    History   Social History  . Marital Status: Married    Spouse Name: N/A  . Number of Children: 0  . Years of Education: 10th grade   Occupational History  . Conservation officer, nature     self-employed  . 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 Smoking status: Current Every Day Smoker -- 0.50 packs/day for 35 years    Types: Cigarettes  . Smokeless tobacco: Current User     Comment: Quit x 1 week.  Does 4 puffs and then again then done.  Has an E-cigerrette  . Alcohol Use: No  . Drug Use: No  . Sexual Activity: Not on file   Other Topics Concern  . None   Social History Narrative   Father died of suicide (gunshot) in 1996-05-21. 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       Patient lives with her husband, her husband's niece Tammy who is mentally retarded and her best friend and the friend's daughter and 6 dogs               Review of Systems: Pertinent items are noted in HPI. Objective:  Physical Exam: Filed Vitals:   05/19/14 1329  BP: 122/66  Pulse: 72  Temp: 98.4 F (36.9 C)  TempSrc: Oral  Height: 5' 3"  (1.6 m)  Weight: 153 lb 9.6 oz (69.673 kg)  SpO2: 96%   General: sitting in chair, NAD HEENT: PERRL, EOMI, no scleral icterus, some clear PND, MMM, no tonsillar exudates or erythema, no submandibular or cervical LAD Cardiac: RRR, no rubs, murmurs or gallops Pulm: Marked expiratory and inspiratory wheezes before nebulizer treatment and then clear to auscultation bilaterally, moving normal volumes of air Abd: soft, nontender, nondistended, BS present Ext: warm and well perfused, no pedal edema Neuro: alert and oriented X3, cranial nerves II-XII grossly intact  Assessment & Plan:  Please see problem oriented charting  Pt discussed with Dr. Ellwood Dense

## 2014-05-19 NOTE — Assessment & Plan Note (Signed)
Pt symptoms in line with acute viral upper respiratory illness.  PLAN: Symptomatic therapy suggested: push fluids, rest, use acetaminophen, Robitussin CF prn, encouraged very strongly to quit smoking and return office visit prn if symptoms persist or worsen. Lack of antibiotic effectiveness discussed with her. Call or return to clinic prn if these symptoms worsen or fail to improve as anticipated. -nebulizer treatment given in clinic -prescription for codeine cough syrup

## 2014-05-19 NOTE — Progress Notes (Signed)
Internal Medicine Clinic Attending  Case discussed with Dr. Algis Liming at the time of the visit.  We reviewed the resident's history and exam and pertinent patient test results.  I agree with the assessment, diagnosis, and plan of care documented in the resident's note.

## 2014-05-19 NOTE — Patient Instructions (Addendum)
General Instructions:   Please try to bring all your medicines next time. This will help [place] keep you safe from mistakes.   Progress Toward Treatment Goals:  Treatment Goal 11/19/2012  Hemoglobin A1C improved  Blood pressure at goal    Self Care Goals & Plans:  Self Care Goal 05/19/2014  Manage my medications take my medicines as prescribed; bring my medications to every visit; refill my medications on time  Monitor my health -  Eat healthy foods drink diet soda or water instead of juice or soda; eat more vegetables; eat foods that are low in salt; eat baked foods instead of fried foods; eat fruit for snacks and desserts  Be physically active -    No flowsheet data found.   Care Management & Community Referrals:  Referral 11/19/2012  Referrals made for care management support smoking cessation counselor; financial counselor  Referrals made to community resources smoking cessation       Upper Respiratory Infection, Adult An upper respiratory infection (URI) is also known as the common cold. It is often caused by a type of germ (virus). Colds are easily spread (contagious). You can pass it to others by kissing, coughing, sneezing, or drinking out of the same glass. Usually, you get better in 1 or 2 weeks.  HOME CARE   Only take medicine as told by your doctor.  Use a warm mist humidifier or breathe in steam from a hot shower.  Drink enough water and fluids to keep your pee (urine) clear or pale yellow.  Get plenty of rest.  Return to work when your temperature is back to normal or as told by your doctor. You may use a face mask and wash your hands to stop your cold from spreading. GET HELP RIGHT AWAY IF:   After the first few days, you feel you are getting worse.  You have questions about your medicine.  You have chills, shortness of breath, or [place] or red spit (mucus).  You have yellow or [place] snot (nasal discharge) or pain in the face, especially when you bend  forward.  You have a fever, puffy (swollen) neck, pain when you swallow, or white spots in the back of your throat.  You have a bad headache, ear pain, sinus pain, or chest pain.  You have a high-pitched whistling sound when you breathe in and out (wheezing).  You have a lasting cough or cough up blood.  You have sore muscles or a stiff neck. MAKE SURE YOU:   Understand these instructions.  Will watch your condition.  Will get help right away if you are not doing well or get worse. Document Released: 08/13/2007 Document Revised: 05/19/2011 Document Reviewed: 06/01/2013 [facility] Patient Information 2015 [place], [place]. This information is not intended to replace advice given to you by your health care provider. Make sure you discuss any questions you have with your health care provider.

## 2014-05-19 NOTE — Assessment & Plan Note (Signed)
Will follow up after resolution of acute illness and pt is instructed to take medications. She states she drinks a large fountain drink of mountain dew everyday and therefore was instructed to start limiting her volume of soda intake and that along with taking her medications should improve her sugars.

## 2014-05-19 NOTE — ED Notes (Signed)
The pt saw her doctor  Today and she was given meds  For a cough.  Her husband was admitted to the hospital today.  Since Wednesday the cough has caused her ribs to hurt.Marland Kitchen

## 2014-05-20 ENCOUNTER — Emergency Department (HOSPITAL_COMMUNITY)
Admission: EM | Admit: 2014-05-20 | Discharge: 2014-05-20 | Disposition: A | Discharge status: Home / Self Care | Payer: Self-pay | Attending: Emergency Medicine | Admitting: Emergency Medicine

## 2014-05-20 ENCOUNTER — Emergency Department (HOSPITAL_COMMUNITY): Payer: Self-pay

## 2014-05-20 DIAGNOSIS — R42 Dizziness and giddiness: Secondary | ICD-10-CM

## 2014-05-20 DIAGNOSIS — J40 Bronchitis, not specified as acute or chronic: Secondary | ICD-10-CM

## 2014-05-20 DIAGNOSIS — R350 Frequency of micturition: Secondary | ICD-10-CM | POA: Insufficient documentation

## 2014-05-20 LAB — URINALYSIS, ROUTINE W REFLEX MICROSCOPIC
Bilirubin Urine: NEGATIVE
Glucose, UA: 500 mg/dL — AB
Hgb urine dipstick: NEGATIVE
Ketones, ur: NEGATIVE mg/dL
Leukocytes, UA: NEGATIVE
Nitrite: NEGATIVE
Protein, ur: 30 mg/dL — AB
Specific Gravity, Urine: >1.03 — ABNORMAL HIGH (ref 1.005–1.030)
Urobilinogen, UA: 0.2 mg/dL (ref 0.0–1.0)
pH: 5.5 (ref 5.0–8.0)

## 2014-05-20 LAB — URINALYSIS, MICROSCOPIC ONLY: Casts: NONE SEEN

## 2014-05-20 LAB — URINE CULTURE: Colony Count: 75000

## 2014-05-20 MED ORDER — DIPHENHYDRAMINE HCL 50 MG/ML IJ SOLN
25.0000 mg | Freq: Once | INTRAMUSCULAR | Status: AC
  Administered 2014-05-20: 25 mg via INTRAVENOUS
  Filled 2014-05-20: qty 1

## 2014-05-20 MED ORDER — MECLIZINE HCL 12.5 MG PO TABS: 12.5000 mg | ORAL_TABLET | Freq: Three times a day (TID) | ORAL | Status: DC | PRN

## 2014-05-20 MED ORDER — IPRATROPIUM-ALBUTEROL 0.5-2.5 (3) MG/3ML IN SOLN
3.0000 mL | Freq: Once | RESPIRATORY_TRACT | Status: AC
  Administered 2014-05-20: 3 mL via RESPIRATORY_TRACT
  Filled 2014-05-20: qty 3

## 2014-05-20 MED ORDER — MECLIZINE HCL 25 MG PO TABS
25.0000 mg | ORAL_TABLET | Freq: Once | ORAL | Status: AC
  Administered 2014-05-20: 25 mg via ORAL
  Filled 2014-05-20: qty 1

## 2014-05-20 MED ORDER — LORAZEPAM 2 MG/ML IJ SOLN
1.0000 mg | Freq: Once | INTRAMUSCULAR | Status: AC
  Administered 2014-05-20: 1 mg via INTRAMUSCULAR
  Filled 2014-05-20: qty 1

## 2014-05-20 MED ORDER — METOCLOPRAMIDE HCL 5 MG/ML IJ SOLN
10.0000 mg | Freq: Once | INTRAMUSCULAR | Status: AC
  Administered 2014-05-20: 10 mg via INTRAVENOUS
  Filled 2014-05-20: qty 2

## 2014-05-20 MED ORDER — AZITHROMYCIN 250 MG PO TABS: ORAL_TABLET | ORAL | Status: DC

## 2014-05-20 MED ORDER — SODIUM CHLORIDE 0.9 % IV BOLUS (SEPSIS)
1000.0000 mL | Freq: Once | INTRAVENOUS | Status: AC
  Administered 2014-05-20: 1000 mL via INTRAVENOUS

## 2014-05-20 MED ORDER — ALBUTEROL SULFATE HFA 108 (90 BASE) MCG/ACT IN AERS: 1.0000 | INHALATION_SPRAY | Freq: Four times a day (QID) | RESPIRATORY_TRACT | Status: DC | PRN

## 2014-05-20 NOTE — Discharge Instructions (Signed)
Use albuterol as needed for cough or wheezing.   Stay hydrated.   Take meclizine as needed for dizziness.   Follow up with your doctor.   Return to ER if you have worse dizziness, passing out, chest pain, fever, trouble breathing,.

## 2014-05-20 NOTE — ED Provider Notes (Signed)
CSN: [id number]     Arrival date & time 05/19/14  2148 History  This chart was scribed for Kendra Arthurs, MD by Eustaquio Maize, ED Scribe. This patient was seen in room A02C/A02C and the patient's care was started at 1:22 AM.    Chief Complaint  Patient presents with  . Cough  . Dizziness   The history is provided by the patient. No language interpreter was used.     HPI Comments: Kendra Adams is a 54 y.o. female who presents to the Emergency Department complaining of a nonproductive cough and dizziness that began Wednesday, 05/17/14 (approximately 5 days ago). Pt describes it as a room spinning dizziness. She was seen by PCP on Friday, 05/19/14, and had breathing treatment at that time. Pt was also prescribed cough medicine with no relief. Pt complains of nausea, posterior neck pain, and a near syncopal feeling. She denies fevers or any other symptoms.      Past Medical History  Diagnosis Date  . Carpal tunnel syndrome on both sides     right hand surgery 1998, left hand surgery 1999.  . Allergic rhinitis   . Asthma   . Depression   . GERD (gastroesophageal reflux disease)   . Hyperlipidemia   . Hypothyroidism   . Psoriasis   . CAD (coronary artery disease)     cath 12/13/10: mRCA 99%, EF 65-70%;  s/p BMS to mRCA  . HTN (hypertension)   . DM2 (diabetes mellitus, type 2)   . Hyperlipidemia   . MI (myocardial infarction)    Past Surgical History  Procedure Laterality Date  . Coronary angioplasty with stent placement    . Carpal tunnel release      w/ bone spurs  . Dilation and curettage of uterus     Family History  Problem Relation Age of Onset  . Diabetes Mother   . Hypertension Mother   . Diabetes Father   . Hypertension Father    History  Substance Use Topics  . Smoking status: Current Every Day Smoker -- 0.50 packs/day for 35 years    Types: Cigarettes  . Smokeless tobacco: Current User     Comment: Quit x 1 week.  Does 4 puffs and then again then done.  Has an  E-cigerrette  . Alcohol Use: No   OB History    No data available     Review of Systems  Constitutional: Negative for fever.  Respiratory: Positive for cough.   Gastrointestinal: Positive for nausea.  Musculoskeletal: Neck pain: Posteriorly.   Neurological: Positive for dizziness.  All other systems reviewed and are negative.     Allergies  Nsaids and Tramadol hcl  Home Medications   Prior to Admission medications   Medication Sig Start Date End Date Taking? Authorizing Provider  albuterol (PROVENTIL HFA;VENTOLIN HFA) 108 (90 BASE) MCG/ACT inhaler Inhale 2 puffs into the lungs every 6 (six) hours as needed for wheezing or shortness of breath. 05/19/14  Yes Jerrye Noble, MD  atorvastatin (LIPITOR) 80 MG tablet Take 80 mg by mouth daily.    Yes Historical Provider, MD  guaiFENesin-codeine (ROBITUSSIN AC) 100-10 MG/5ML syrup Take 5 mLs by mouth 3 (three) times daily as needed for cough. 05/19/14  Yes Jerrye Noble, MD  levothyroxine (SYNTHROID, LEVOTHROID) 150 MCG tablet TAKE 1 TABLET BY MOUTH DAILY BEFORE BREAKFAST. 05/05/14  Yes Jerrye Noble, MD  levothyroxine (SYNTHROID, LEVOTHROID) 25 MCG tablet Take 25 mcg by mouth daily before breakfast.   Yes  Historical Provider, MD  lisinopril (PRINIVIL,ZESTRIL) 10 MG tablet Take 10 mg by mouth daily.   Yes Historical Provider, MD  metFORMIN (GLUCOPHAGE) 1000 MG tablet Take 1 tablet twice a day 03/29/14  Yes Jerrye Noble, MD  metoprolol succinate (TOPROL-XL) 50 MG 24 hr tablet TAKE 1 TABLET BY MOUTH DAILY. 05/05/14  Yes Dorothy Spark, MD  nitroGLYCERIN (NITROSTAT) 0.4 MG SL tablet Place 1 tablet (0.4 mg total) under the tongue every 5 (five) minutes as needed for chest pain. 04/05/14  Yes Dorothy Spark, MD  sertraline (ZOLOFT) 50 MG tablet Take 150 mg by mouth daily.   Yes Historical Provider, MD  acetaminophen (TYLENOL) 325 MG tablet Take 650 mg by mouth every 4 (four) hours as needed for mild pain.    Historical Provider, MD  aspirin 81 MG  chewable tablet Chew 81 mg by mouth daily.    Historical Provider, MD  chlorpheniramine-HYDROcodone (TUSSIONEX PENNKINETIC ER) 10-8 MG/5ML LQCR Take 5 mLs by mouth every 12 (twelve) hours as needed for cough (Cough). Patient not taking: Reported on 05/20/2014 11/01/13   Cleatrice Burke, PA-C  cyclobenzaprine (FLEXERIL) 10 MG tablet TAKE 1 TABLET (10 MG TOTAL) BY MOUTH 3 (THREE) TIMES DAILY AS NEEDED FOR MUSCLE SPASMS. Patient not taking: Reported on 05/20/2014 03/28/14   Jerrye Noble, MD  Ferrous Sulfate (IRON) 325 (65 FE) MG TABS Take 325 mg by mouth daily. Patient not taking: Reported on 05/20/2014 02/27/14   Ejiroghene Arlyce Dice, MD  Fluticasone-Salmeterol (ADVAIR) 100-50 MCG/DOSE AEPB Inhale 2 puffs into the lungs 2 (two) times daily.    Historical Provider, MD  hydrocortisone valerate ointment (WEST-CORT) 0.2 % Apply 1 application topically 2 (two) times daily.    Historical Provider, MD  levothyroxine (SYNTHROID, LEVOTHROID) 150 MCG tablet Take 150 mcg by mouth daily before breakfast.    Historical Provider, MD  metoprolol succinate (TOPROL-XL) 50 MG 24 hr tablet Take 50 mg by mouth daily. Take with or immediately following a meal.    Historical Provider, MD  Omega-3 Fatty Acids (FISH OIL) 1000 MG CAPS Take 1 capsule by mouth daily.    Historical Provider, MD  omeprazole (PRILOSEC) 40 MG capsule Take 40 mg by mouth daily.    Historical Provider, MD  triamcinolone (NASACORT AQ) 55 MCG/ACT AERO nasal inhaler Place 2 sprays into the nose daily.    Historical Provider, MD   Triage Vitals: BP 115/62 mmHg  Pulse 106  Temp(Src) 99.8 F (37.7 C)  Resp 20  Ht 5' 3"  (1.6 m)  Wt 257 lb (116.574 kg)  BMI 45.54 kg/m2  SpO2 95%   Physical Exam  Constitutional: She is oriented to person, place, and time. She appears well-developed and well-nourished. No distress.  HENT:  Head: Normocephalic and atraumatic.  Mouth/Throat: Mucous membranes are dry.  Eyes: Conjunctivae are normal. Right eye exhibits  nystagmus (Mild left sided nystagmus. ).  Neck: Neck supple. No tracheal deviation present.  Cardiovascular: Normal rate.   Pulmonary/Chest: Effort normal. No respiratory distress. She has wheezes (Mild diffuse wheezing. No retractions. ).  Musculoskeletal: Normal range of motion.  Neurological: She is alert and oriented to person, place, and time.  Finger to nose test normal.  No pronator drift. Cranial nerves 2-12 intact.   Skin: Skin is warm and dry.  Psychiatric: She has a normal mood and affect. Her behavior is normal.  Nursing note and vitals reviewed.   ED Course  Procedures (including critical care time)  DIAGNOSTIC STUDIES: Oxygen Saturation is 95%  on RA, normal by my interpretation.    COORDINATION OF CARE: 1:27 AM-Discussed treatment plan which includes Brain MRI with pt at bedside and pt agreed to plan.   Labs Review Labs Reviewed  COMPREHENSIVE METABOLIC PANEL - Abnormal; Notable for the following:    Glucose, Bld 190 (*)    AST 58 (*)    ALT 49 (*)    All other components within normal limits  CBC WITH DIFFERENTIAL/PLATELET    Imaging Review Dg Chest 2 View  05/20/2014   CLINICAL DATA:  Acute onset of cough, congestion, fever, shortness of breath, dizziness, tachycardia and high blood pressure. Initial encounter.  EXAM: CHEST  2 VIEW  COMPARISON:  Chest radiograph performed 11/01/2013  FINDINGS: The lungs are well-aerated. Minimal bilateral opacities likely reflect atelectasis. There is no evidence of pleural effusion or pneumothorax.  The heart is normal in size; the mediastinal contour is within normal limits. No acute osseous abnormalities are seen.  IMPRESSION: Minimal bilateral opacities likely reflect atelectasis.   Electronically Signed   By: Garald Balding M.D.   On: 05/20/2014 00:01   Mr Brain Wo Contrast  05/20/2014   CLINICAL DATA:  Dizziness beginning 5 days ago, associated with nonproductive cough, nausea, posterior neck pain. History of hypertension,  type 2 diabetes and hyperlipidemia appear  EXAM: MRI HEAD WITHOUT CONTRAST  TECHNIQUE: Multiplanar, multiecho pulse sequences of the brain and surrounding structures were obtained without intravenous contrast.  COMPARISON:  MRI of the brain February 11, 2014  FINDINGS: No reduced diffusion to suggest acute ischemia. No susceptibility artifact to suggest hemorrhage.  RIGHT parietal encephalomalacia. Patchy scattered supratentorial white matter T2 hyperintensities are similar to prior examination, advanced for age. No midline shift, mass effect, mass lesions.  No abnormal extra-axial fluid collections. Normal major intracranial vascular flow voids seen at the skull base.  Ocular globes and orbital contents are unremarkable. RIGHT posterior ethmoid mucosal retention cyst without paranasal sinus air-fluid levels. Mastoid air cells are well aerated. No abnormal sellar expansion. No cerebellar tonsillar ectopia. No suspicious calvarial bone marrow signal. Patient is edentulous.  IMPRESSION: No acute intracranial process, no acute ischemia.  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 Moderate white matter changes can be seen with chronic small vessel ischemic disease, advanced for age though, similar to prior imaging.   Electronically Signed   By: Elon Alas   On: 05/20/2014 02:45     EKG Interpretation   Date/Time:  Friday May 19 2014 22:23:36 EST Ventricular Rate:  116 PR Interval:  148 QRS Duration: 68 QT Interval:  318 QTC Calculation: 442 R Axis:   53 Text Interpretation:  Sinus tachycardia Otherwise normal ECG tachycardia  new since previous Confirmed by Lacheryl Niesen  MD, Charlton Boule ([id number]) on 05/20/2014  12:49:10 AM      MDM   Final diagnoses:  None     DERHONDA EASTLICK is a 54 y.o. female here with dizziness, cough. Has previous parietal stroke. Will get MRI to r/o stroke. Cough likely bronchitis vs pneumonia. Will get labs and CXR.   3:28 AM  MRI  showed no acute stroke. Felt better with meclizine. Nl gait. O2 improved with 1 neb. CXR showed bronchitis. Will dc home with albuterol, meclizine.    I personally performed the services described in this documentation, which was scribed in my presence. The recorded information has been reviewed and is accurate.    Kendra Arthurs, MD 05/20/14 [phone]

## 2014-05-20 NOTE — Assessment & Plan Note (Signed)
Pt does have a history of stress urinary incontinence and has no other systemic signs of cystitis or pyelonephritis. This maybe exacerbated by her recent URI which makes her cough more often and therefore have made her incontinence worse.  -will UA, UCx -wait until results before antibiotic use

## 2014-05-20 NOTE — ED Notes (Signed)
Patient transported to MRI 

## 2014-05-23 ENCOUNTER — Ambulatory Visit (INDEPENDENT_AMBULATORY_CARE_PROVIDER_SITE_OTHER): Payer: Self-pay | Admitting: Internal Medicine

## 2014-05-23 ENCOUNTER — Encounter: Payer: Self-pay | Admitting: Internal Medicine

## 2014-05-23 ENCOUNTER — Telehealth: Payer: Self-pay | Admitting: *Deleted

## 2014-05-23 VITALS — BP 134/83 | HR 103 | Temp 97.6°F | Ht 63.0 in | Wt 252.1 lb

## 2014-05-23 DIAGNOSIS — E118 Type 2 diabetes mellitus with unspecified complications: Secondary | ICD-10-CM

## 2014-05-23 DIAGNOSIS — N3000 Acute cystitis without hematuria: Secondary | ICD-10-CM

## 2014-05-23 DIAGNOSIS — E039 Hypothyroidism, unspecified: Secondary | ICD-10-CM

## 2014-05-23 DIAGNOSIS — I10 Essential (primary) hypertension: Secondary | ICD-10-CM

## 2014-05-23 DIAGNOSIS — E038 Other specified hypothyroidism: Secondary | ICD-10-CM

## 2014-05-23 LAB — TSH: TSH: 2.58 u[IU]/mL (ref 0.350–4.500)

## 2014-05-23 MED ORDER — GLIPIZIDE 5 MG PO TABS: 5.0000 mg | ORAL_TABLET | Freq: Every day | ORAL | Status: DC

## 2014-05-23 MED ORDER — SULFAMETHOXAZOLE-TRIMETHOPRIM 800-160 MG PO TABS: 1.0000 | ORAL_TABLET | Freq: Two times a day (BID) | ORAL | Status: DC

## 2014-05-23 MED ORDER — BLOOD GLUCOSE TEST VI STRP: 1.0000 | ORAL_STRIP | Freq: Every day | Status: DC

## 2014-05-23 MED ORDER — BLOOD GLUCOSE MONITORING SUPPL DEVI: 1.0000 | Freq: Every day | Status: DC

## 2014-05-23 MED ORDER — HYDROCOD POLST-CHLORPHEN POLST 10-8 MG/5ML PO LQCR: 5.0000 mL | Freq: Two times a day (BID) | ORAL | Status: DC | PRN

## 2014-05-23 MED ORDER — LANCETS MISC: 1.0000 [IU] | Freq: Every day | Status: DC

## 2014-05-23 NOTE — Progress Notes (Signed)
Patient ID: Kendra Adams, female   DOB: 1960-12-23, 54 y.o.   MRN: [id number]   Subjective:   Patient ID: Kendra Adams female   DOB: 1961/01/26 54 y.o.   MRN: [id number]  HPI: [name] is a 54 y.o. with PMH listed below, presented today for follow up of her DM, HTN, CAD. Complaints of sudden having sweats,  And feeling hot. Says he has not seen her period since she was in her 54s, after her last child, when she had D and C. Pt has these symptoms intermittently during the day, and this has been going on for the past 2 weeks. Has not checked her temeperature and denies chills.  Past Medical History  Diagnosis Date  . Carpal tunnel syndrome on both sides     right hand surgery 1998, left hand surgery 1999.  . Allergic rhinitis   . Asthma   . Depression   . GERD (gastroesophageal reflux disease)   . Hyperlipidemia   . Hypothyroidism   . Psoriasis   . CAD (coronary artery disease)     cath 12/13/10: mRCA 99%, EF 65-70%;  s/p BMS to mRCA  . HTN (hypertension)   . DM2 (diabetes mellitus, type 2)   . Hyperlipidemia   . MI (myocardial infarction)    Current Outpatient Prescriptions  Medication Sig Dispense Refill  . acetaminophen (TYLENOL) 325 MG tablet Take 650 mg by mouth every 4 (four) hours as needed for mild pain.    Marland Kitchen albuterol (PROVENTIL HFA;VENTOLIN HFA) 108 (90 BASE) MCG/ACT inhaler Inhale 1-2 puffs into the lungs every 6 (six) hours as needed for wheezing or shortness of breath. 1 Inhaler 0  . aspirin 81 MG chewable tablet Chew 81 mg by mouth daily.    Marland Kitchen atorvastatin (LIPITOR) 80 MG tablet Take 80 mg by mouth daily.     Marland Kitchen azithromycin (ZITHROMAX Z-PAK) 250 MG tablet 2 po day one, then 1 daily x 4 days 5 tablet 0  . chlorpheniramine-HYDROcodone (TUSSIONEX PENNKINETIC ER) 10-8 MG/5ML LQCR Take 5 mLs by mouth every 12 (twelve) hours as needed for cough (Cough). (Patient not taking: Reported on 05/20/2014) 115 mL 0  . cyclobenzaprine (FLEXERIL) 10 MG tablet TAKE 1 TABLET (10 MG  TOTAL) BY MOUTH 3 (THREE) TIMES DAILY AS NEEDED FOR MUSCLE SPASMS. (Patient not taking: Reported on 05/20/2014) 30 tablet 0  . Ferrous Sulfate (IRON) 325 (65 FE) MG TABS Take 325 mg by mouth daily. (Patient not taking: Reported on 05/20/2014) 30 each 0  . Fluticasone-Salmeterol (ADVAIR) 100-50 MCG/DOSE AEPB Inhale 2 puffs into the lungs 2 (two) times daily.    Marland Kitchen guaiFENesin-codeine (ROBITUSSIN AC) 100-10 MG/5ML syrup Take 5 mLs by mouth 3 (three) times daily as needed for cough. 120 mL 0  . hydrocortisone valerate ointment (WEST-CORT) 0.2 % Apply 1 application topically 2 (two) times daily.    Marland Kitchen levothyroxine (SYNTHROID, LEVOTHROID) 150 MCG tablet Take 150 mcg by mouth daily before breakfast.    . levothyroxine (SYNTHROID, LEVOTHROID) 150 MCG tablet TAKE 1 TABLET BY MOUTH DAILY BEFORE BREAKFAST. 30 tablet 11  . levothyroxine (SYNTHROID, LEVOTHROID) 25 MCG tablet Take 25 mcg by mouth daily before breakfast.    . lisinopril (PRINIVIL,ZESTRIL) 10 MG tablet Take 10 mg by mouth daily.    . meclizine (ANTIVERT) 12.5 MG tablet Take 1 tablet (12.5 mg total) by mouth 3 (three) times daily as needed for dizziness. 30 tablet 0  . metFORMIN (GLUCOPHAGE) 1000 MG tablet Take 1 tablet twice a day  60 tablet 2  . metoprolol succinate (TOPROL-XL) 50 MG 24 hr tablet Take 50 mg by mouth daily. Take with or immediately following a meal.    . metoprolol succinate (TOPROL-XL) 50 MG 24 hr tablet TAKE 1 TABLET BY MOUTH DAILY. 30 tablet 4  . nitroGLYCERIN (NITROSTAT) 0.4 MG SL tablet Place 1 tablet (0.4 mg total) under the tongue every 5 (five) minutes as needed for chest pain. 25 tablet 3  . Omega-3 Fatty Acids (FISH OIL) 1000 MG CAPS Take 1 capsule by mouth daily.    Marland Kitchen omeprazole (PRILOSEC) 40 MG capsule Take 40 mg by mouth daily.    . sertraline (ZOLOFT) 50 MG tablet Take 150 mg by mouth daily.    Marland Kitchen triamcinolone (NASACORT AQ) 55 MCG/ACT AERO nasal inhaler Place 2 sprays into the nose daily.     No current  facility-administered medications for this visit.   Family History  Problem Relation Age of Onset  . Diabetes Mother   . Hypertension Mother   . Diabetes Father   . Hypertension Father    History   Social History  . Marital Status: Married    Spouse Name: N/A  . Number of Children: 0  . Years of Education: 10th grade   Occupational History  . Conservation officer, nature     self-employed  . 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 Smoking status: Former Smoker -- 0.50 packs/day for 35 years    Types: Cigarettes    Quit date: 05/19/2014  . Smokeless tobacco: Current User     Comment: Quit x 1 week.  Does 4 puffs and then again then done.  Has an E-cigerrette  . Alcohol Use: No  . Drug Use: No  . Sexual Activity: Not on file   Other Topics Concern  . None   Social History Narrative   Father died of suicide (gunshot) in 1996-05-08. 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       Patient lives with her husband, her husband's niece Tammy who is mentally retarded and her best friend and the friend's daughter and 6 dogs               Review of Systems: CONSTITUTIONAL- No Fever. SKIN- No Rash, colour changes or itching. HEAD- No Headache or dizziness. RESPIRATORY- Has Cough for about 10 days, some slight SOB. CARDIAC- No Palpitations, chest pain. GI- No diarrhoea NEUROLOGIC- No Numbness,or seizures .  Objective:  Physical Exam: Filed Vitals:   05/23/14 1428 05/23/14 1430  BP:  134/83  Pulse:  103  Temp:  97.6 F (36.4 C)  TempSrc:  Oral  Height: 5' 3"  (1.6 m)   Weight: 252 lb 1.6 oz (114.352 kg) 252 lb 1.6 oz (114.352 kg)  SpO2:  99%   GENERAL- alert, co-operative, appears as stated age, not in any distress. HEENT- Atraumatic, normocephalic CARDIAC- RRR, no murmurs, rubs or gallops. RESP- Moving equal volumes of air, and clear to auscultation bilaterally, no wheezes or  crackles. ABDOMEN- Soft, nontender,obese, bowel sounds present. BACK- No tenderness along the vertebrae, no CVA tenderness. NEURO- No obvious Cr N abnormality intact, Gait- Normal. EXTREMITIES- pulse 2+, symmetric, no pedal edema. SKIN- Warm, dry, No rash, has a 0.4 by 0.4cm skin tag on lower back, chronic.  Assessment & Plan:   The patient's case and plan of  care was discussed with attending physician, Dr. Eppie Gibson  Please see problem based charting for assessment and plan.

## 2014-05-23 NOTE — Telephone Encounter (Signed)
Pt presents after her visit today in clinic to triage wanting to know about her urine labs recently, she is having frequency, no pain, sm amts of urine at voidings, and some incontinence, dark urine, and has a foul odor. Please advise what to tell this pt. The nurse for the doctor this pm states they are behind and triage will need to deal with this

## 2014-05-23 NOTE — Patient Instructions (Signed)
We have prescribed the cough medication you said worked previously in the past.  Continue using your inhalers.  For your diabetes, we have started a new medication called Glipizide- Take 24m- one tablet once a day.  We will like you to check your blood sugars once a day. We have prescribed- A glucometer, and strips. We will like to see you in 4 weeks with your glucometer and readings so we can determine how best to proceed if the medications are not very effective. We will like you to check your blood sugars once a day, every morning, before you eat. This is very important.  Also you have to watch your diet, as this might be contributing to your diabetes.

## 2014-05-23 NOTE — Telephone Encounter (Signed)
Spoke with Ms. Shell about her symptoms.  She confirms urgency and frequency that is very similar to her previous UTI.  She also notes subjective hot flashes which she states is different from her vasomotor symptoms.  She did not measure her temperature.  Given the very high likelihood of a symptomatic UTI we will start Bactrim DS 1 tablet PO BID X 5 days.  She denies an allergy to sulfa mediations and is unsure if she has taken this medication before.  This prescription was sent to her pharmacy.  She will call if this therapy is ineffective in resolving her symptoms.

## 2014-05-25 NOTE — Assessment & Plan Note (Signed)
Lab Results  Component Value Date   HGBA1C 7.9 05/19/2014   HGBA1C 7.9 02/10/2014   HGBA1C 7.3 04/22/2013     Assessment: Diabetes control:  Uncontrolled Progress toward A1C goal:   Not at goal Comments: Complaint with Metformin 1051m BID, which was her new dose at her last visit.  Plan: Medications:  Start glipizide 532mdaily, and cont metformin 100010mid. Home glucose monitoring: Frequency:  once Timing:  fasting Instruction/counseling given: reminded to bring blood glucose meter & log to each visit, reminded to bring medications to each visit and discussed diet Educational resources provided: handout Self management tools provided:   Other plans: Pt has glucometer at home, encouraged her to check her Blood sugars every morning, cautioned that there is a risk of hypoglycemia with new medication. She voiced understanding. She is to come back with glucometer and log in 1 month. prescription for supplies given.

## 2014-05-25 NOTE — Assessment & Plan Note (Signed)
BP Readings from Last 3 Encounters:  05/23/14 134/83  05/20/14 104/64  05/19/14 122/66    Lab Results  Component Value Date   NA 138 05/19/2014   K 3.9 05/19/2014   CREATININE 0.75 05/19/2014    Assessment: Blood pressure control:  controlled Progress toward BP goal:   at goal Comments: Complaint with meds- metop XR- 37m daily, lisinopril 135mdaily, takes a daily aspirin.  Plan: Medications:  continue current medications Educational resources provided: brochure Self management tools provided:   Other plans:

## 2014-05-25 NOTE — Assessment & Plan Note (Signed)
Foot exam done today. She will like to come to the pap smear clinic on the 218th of April. Also due for mammogram, order put in.

## 2014-05-25 NOTE — Assessment & Plan Note (Signed)
Appears stable, on 175Ug of Synthroid daily. No symptoms of hypothyroidsm. 02/10/2013- Last TSH- 10.63, while she was on 150m. TSH was subsequently increased.   Plan- TSH today normal at 2.58.

## 2014-05-25 NOTE — Assessment & Plan Note (Addendum)
Pt came back to clinic with complaints of having frequency, without burning, very similar to her previous UTI. She was given a course of bactrim for 3 days. Also no recorded fevers but says she feels hot, she described these symptoms were different from the typical vasomotor symptoms which were supportive of menopause to me. Temp in clinic was WNL, CVA tenderness on my exam was negative.     Notes By Loren Racer, MD- 07/01/2013, under symptom of urinary incontinence. Patients urine dip stick was negative but Urine culture showed >100K E.coli. Given her symptom presentation, will go ahead and treat her with Ciprofloxacin for 5 days. Plans Cipro for 5 days.  Notes By Loren Racer, MD- 06/29/2013, under symptom of urinary incontinence. Clinical symptoms suspicious for urge incontinence given leakage of urine with sudden urgency. Urine dip stick negative for nitrites and leukocyte esterase. Discussed with the attending regarding further management.  Plans: Check U/A and urine cultures to rule out infection. Check Bladder scan to evaluate baldder volume, post-residual volume and bladder obstructions. Printed material about kegel exercises is handed to the patient and recommended her to practice it as many times a day as possible. If symptoms worsen or interfere with her life style, a referral to the urology will be considered.

## 2014-05-26 ENCOUNTER — Encounter: Payer: Self-pay | Admitting: Physician Assistant

## 2014-05-26 NOTE — Progress Notes (Deleted)
Cardiology Office Note   Date:  05/26/2014   ID:  Kendra Adams, DOB 14-Feb-1961, MRN [id number]  PCP:  Clinton Gallant, MD  Cardiologist:  Dr. Ena Dawley   Chief Complaint  Patient presents with  . Coronary Artery Disease     History of Present Illness: Kendra Adams is a 54 y.o. female with a hx of CAD status post PCI with BMS 1 to the RCA in 2012, HTN, HL, DM2, tobacco abuse, COPD. Last seen by Dr. Meda Coffee 8/15. Patient complained of exertional chest pain. Myoview was obtained and this was low risk.  ***  Studies/Reports Reviewed Today:  Myoview 10/25/13 Low risk stress nuclear study with fixed inferior, apical and anterior attenuation artifacts despite 2 day imaging. Poor exercise capacity.  No ischemia. LV Ejection Fraction: 53%.  LV Wall Motion:  NL LV Function; NL Wall Motion   Past Medical History  Diagnosis Date  . Carpal tunnel syndrome on both sides     right hand surgery 1998, left hand surgery 1999.  . Allergic rhinitis   . Asthma   . Depression   . GERD (gastroesophageal reflux disease)   . Hyperlipidemia   . Hypothyroidism   . Psoriasis   . CAD (coronary artery disease)     cath 12/13/10: mRCA 99%, EF 65-70%;  s/p BMS to mRCA  . HTN (hypertension)   . DM2 (diabetes mellitus, type 2)   . Hyperlipidemia   . MI (myocardial infarction)     Past Surgical History  Procedure Laterality Date  . Coronary angioplasty with stent placement    . Carpal tunnel release      w/ bone spurs  . Dilation and curettage of uterus       Current Outpatient Prescriptions  Medication Sig Dispense Refill  . acetaminophen (TYLENOL) 325 MG tablet Take 650 mg by mouth every 4 (four) hours as needed for mild pain.    Marland Kitchen albuterol (PROVENTIL HFA;VENTOLIN HFA) 108 (90 BASE) MCG/ACT inhaler Inhale 1-2 puffs into the lungs every 6 (six) hours as needed for wheezing or shortness of breath. 1 Inhaler 0  . aspirin 81 MG chewable tablet Chew 81 mg by mouth daily.    Marland Kitchen atorvastatin  (LIPITOR) 80 MG tablet Take 80 mg by mouth daily.     Marland Kitchen azithromycin (ZITHROMAX Z-PAK) 250 MG tablet 2 po day one, then 1 daily x 4 days 5 tablet 0  . Blood Glucose Monitoring Suppl DEVI 1 Device by Does not apply route daily before breakfast. 1 each 0  . chlorpheniramine-HYDROcodone (TUSSIONEX PENNKINETIC ER) 10-8 MG/5ML LQCR Take 5 mLs by mouth every 12 (twelve) hours as needed for cough (Cough). 115 mL 0  . cyclobenzaprine (FLEXERIL) 10 MG tablet TAKE 1 TABLET (10 MG TOTAL) BY MOUTH 3 (THREE) TIMES DAILY AS NEEDED FOR MUSCLE SPASMS. (Patient not taking: Reported on 05/20/2014) 30 tablet 0  . Ferrous Sulfate (IRON) 325 (65 FE) MG TABS Take 325 mg by mouth daily. (Patient not taking: Reported on 05/20/2014) 30 each 0  . Fluticasone-Salmeterol (ADVAIR) 100-50 MCG/DOSE AEPB Inhale 2 puffs into the lungs 2 (two) times daily.    Marland Kitchen glipiZIDE (GLUCOTROL) 5 MG tablet Take 1 tablet (5 mg total) by mouth daily before breakfast. 30 tablet 1  . Glucose Blood (BLOOD GLUCOSE TEST STRIPS) STRP 1 strip by In Vitro route daily before breakfast. 100 each 0  . hydrocortisone valerate ointment (WEST-CORT) 0.2 % Apply 1 application topically 2 (two) times daily.    Marland Kitchen  Lancets MISC 1 Units by Does not apply route daily. 50 each 0  . levothyroxine (SYNTHROID, LEVOTHROID) 150 MCG tablet TAKE 1 TABLET BY MOUTH DAILY BEFORE BREAKFAST. 30 tablet 11  . levothyroxine (SYNTHROID, LEVOTHROID) 25 MCG tablet Take 25 mcg by mouth daily before breakfast.    . lisinopril (PRINIVIL,ZESTRIL) 10 MG tablet Take 10 mg by mouth daily.    . meclizine (ANTIVERT) 12.5 MG tablet Take 1 tablet (12.5 mg total) by mouth 3 (three) times daily as needed for dizziness. 30 tablet 0  . metFORMIN (GLUCOPHAGE) 1000 MG tablet Take 1 tablet twice a day 60 tablet 2  . metoprolol succinate (TOPROL-XL) 50 MG 24 hr tablet TAKE 1 TABLET BY MOUTH DAILY. 30 tablet 4  . nitroGLYCERIN (NITROSTAT) 0.4 MG SL tablet Place 1 tablet (0.4 mg total) under the tongue every  5 (five) minutes as needed for chest pain. 25 tablet 3  . Omega-3 Fatty Acids (FISH OIL) 1000 MG CAPS Take 1 capsule by mouth daily.    Marland Kitchen omeprazole (PRILOSEC) 40 MG capsule Take 40 mg by mouth daily.    . sertraline (ZOLOFT) 50 MG tablet Take 150 mg by mouth daily.    Marland Kitchen sulfamethoxazole-trimethoprim (BACTRIM DS,SEPTRA DS) 800-160 MG per tablet Take 1 tablet by mouth 2 (two) times daily. 10 tablet 0  . triamcinolone (NASACORT AQ) 55 MCG/ACT AERO nasal inhaler Place 2 sprays into the nose daily.     No current facility-administered medications for this visit.    Allergies:   Nsaids and Tramadol hcl    Social History:  The patient  reports that she quit smoking 7 days ago. Her smoking use included Cigarettes. She has a 17.5 pack-year smoking history. She uses smokeless tobacco. She reports that she does not drink alcohol or use illicit drugs.   Family History:  The patient's family history includes Diabetes in her father and mother; Hypertension in her father and mother.    ROS:   Please see the history of present illness.   ROS    PHYSICAL EXAM: VS:  There were no vitals taken for this visit.    Wt Readings from Last 3 Encounters:  05/23/14 252 lb 1.6 oz (114.352 kg)  05/19/14 257 lb (116.574 kg)  05/19/14 153 lb 9.6 oz (69.673 kg)     GEN: Well nourished, well developed, in no acute distress HEENT: normal Neck: *** JVD, ***carotid bruits, no masses Cardiac:  Normal S1/S2, ***RRR; *** murmur ***, *** no rubs or gallops, {NUMBERS; 1+ TO 4+, TRACE/RARE:14493} edema  Respiratory:  ***clear to auscultation bilaterally, no wheezing, rhonchi or rales. GI: ***soft, nontender, nondistended, + BS MS: no deformity or atrophy Skin: warm and dry  Neuro:  CNs II-XII intact, Strength and sensation are intact Psych: Normal affect   EKG:  EKG {ACTION; IS/IS ZMO:[id number]} ordered today.  It demonstrates:   ***   Recent Labs: 05/19/2014: ALT 49*; BUN 8; Creatinine 0.75; Hemoglobin 14.1;  Platelets 192; Potassium 3.9; Sodium 138 05/23/2014: TSH 2.580    Lipid Panel    Component Value Date/Time   CHOL 152 02/11/2014 0852   TRIG 141 02/11/2014 0852   HDL 28* 02/11/2014 0852   CHOLHDL 5.4 02/11/2014 0852   VLDL 28 02/11/2014 0852   LDLCALC 96 02/11/2014 0852   LDLDIRECT 142.4 03/21/2011 0843      ASSESSMENT AND PLAN:  Coronary artery disease involving native coronary artery of native heart without angina pectoris  Essential hypertension  HLD (hyperlipidemia) ***   Current medicines  are reviewed at length with the patient today.  The patient {ACTIONS; HAS/DOES NOT HAVE:19233} concerns regarding medicines.  The following changes have been made:  {PLAN; NO CHANGE:13088:s}  Labs/ tests ordered today include: *** No orders of the defined types were placed in this encounter.    Disposition:   FU with ***   Signed, Richardson Dopp, PA-C, MHS 05/26/2014 8:09 AM    [facility] [address], [place], [place]  [address] Phone: [phone]; Fax: [phone]

## 2014-05-26 NOTE — Progress Notes (Signed)
This encounter was created in error - please disregard.

## 2014-05-30 NOTE — Progress Notes (Signed)
Case discussed with Dr. Denton Brick soon after the resident saw the patient. We reviewed the resident's history and exam and pertinent patient test results. I agree with the assessment, diagnosis, and plan of care documented in the resident's note.

## 2014-05-31 ENCOUNTER — Other Ambulatory Visit: Payer: Self-pay | Admitting: *Deleted

## 2014-06-01 MED ORDER — SERTRALINE HCL 50 MG PO TABS: 150.0000 mg | ORAL_TABLET | Freq: Every day | ORAL | Status: DC

## 2014-06-16 ENCOUNTER — Other Ambulatory Visit: Payer: Self-pay | Admitting: Internal Medicine

## 2014-06-17 ENCOUNTER — Other Ambulatory Visit: Payer: Self-pay | Admitting: Internal Medicine

## 2014-06-23 ENCOUNTER — Other Ambulatory Visit: Payer: Self-pay | Admitting: Internal Medicine

## 2014-06-26 ENCOUNTER — Encounter (HOSPITAL_COMMUNITY): Payer: Self-pay

## 2014-06-28 ENCOUNTER — Emergency Department (HOSPITAL_COMMUNITY)
Admission: EM | Admit: 2014-06-28 | Discharge: 2014-06-28 | Disposition: A | Discharge status: Home / Self Care | Payer: Self-pay | Attending: Emergency Medicine | Admitting: Emergency Medicine

## 2014-06-28 ENCOUNTER — Encounter (HOSPITAL_COMMUNITY): Payer: Self-pay

## 2014-06-28 DIAGNOSIS — Z79899 Other long term (current) drug therapy: Secondary | ICD-10-CM | POA: Insufficient documentation

## 2014-06-28 DIAGNOSIS — E039 Hypothyroidism, unspecified: Secondary | ICD-10-CM | POA: Insufficient documentation

## 2014-06-28 DIAGNOSIS — E785 Hyperlipidemia, unspecified: Secondary | ICD-10-CM | POA: Insufficient documentation

## 2014-06-28 DIAGNOSIS — Z8669 Personal history of other diseases of the nervous system and sense organs: Secondary | ICD-10-CM | POA: Insufficient documentation

## 2014-06-28 DIAGNOSIS — Z87891 Personal history of nicotine dependence: Secondary | ICD-10-CM | POA: Insufficient documentation

## 2014-06-28 DIAGNOSIS — E119 Type 2 diabetes mellitus without complications: Secondary | ICD-10-CM | POA: Insufficient documentation

## 2014-06-28 DIAGNOSIS — F329 Major depressive disorder, single episode, unspecified: Secondary | ICD-10-CM | POA: Insufficient documentation

## 2014-06-28 DIAGNOSIS — Y9289 Other specified places as the place of occurrence of the external cause: Secondary | ICD-10-CM | POA: Insufficient documentation

## 2014-06-28 DIAGNOSIS — I252 Old myocardial infarction: Secondary | ICD-10-CM | POA: Insufficient documentation

## 2014-06-28 DIAGNOSIS — S81811A Laceration without foreign body, right lower leg, initial encounter: Secondary | ICD-10-CM | POA: Insufficient documentation

## 2014-06-28 DIAGNOSIS — W293XXA Contact with powered garden and outdoor hand tools and machinery, initial encounter: Secondary | ICD-10-CM | POA: Insufficient documentation

## 2014-06-28 DIAGNOSIS — Z7982 Long term (current) use of aspirin: Secondary | ICD-10-CM | POA: Insufficient documentation

## 2014-06-28 DIAGNOSIS — Y9389 Activity, other specified: Secondary | ICD-10-CM | POA: Insufficient documentation

## 2014-06-28 DIAGNOSIS — Z792 Long term (current) use of antibiotics: Secondary | ICD-10-CM | POA: Insufficient documentation

## 2014-06-28 DIAGNOSIS — J45909 Unspecified asthma, uncomplicated: Secondary | ICD-10-CM | POA: Insufficient documentation

## 2014-06-28 DIAGNOSIS — Y998 Other external cause status: Secondary | ICD-10-CM | POA: Insufficient documentation

## 2014-06-28 DIAGNOSIS — I2510 Atherosclerotic heart disease of native coronary artery without angina pectoris: Secondary | ICD-10-CM | POA: Insufficient documentation

## 2014-06-28 MED ORDER — TETANUS-DIPHTH-ACELL PERTUSSIS 5-2.5-18.5 LF-MCG/0.5 IM SUSP
0.5000 mL | Freq: Once | INTRAMUSCULAR | Status: AC
  Administered 2014-06-28: 0.5 mL via INTRAMUSCULAR
  Filled 2014-06-28: qty 0.5

## 2014-06-28 NOTE — ED Provider Notes (Signed)
CSN: [id number]     Arrival date & time 06/28/14  0123 History   First MD Initiated Contact with Patient 06/28/14 0151     Chief Complaint  Patient presents with  . Extremity Laceration     (Consider location/radiation/quality/duration/timing/severity/associated sxs/prior Treatment) HPI Comments: Patient presents with a laceration to the anterior portion of her right shin that occurred 6 PM Monday night.  It is now 2 AM Wednesday morning.  The wound has been open for greater than 24 hours.  She states she does not remember her last tetanus immunization. He states the head.  Schirmer's that she was putting in her truck fell from her hand around and jumped up striking her on the shin.  She had initial bleeding.  No bleeding since then.  She is a diabetic.  Blood sugars have been normal  The history is provided by the patient.    Past Medical History  Diagnosis Date  . Carpal tunnel syndrome on both sides     right hand surgery 1998, left hand surgery 1999.  . Allergic rhinitis   . Asthma   . Depression   . GERD (gastroesophageal reflux disease)   . Hyperlipidemia   . Hypothyroidism   . Psoriasis   . CAD (coronary artery disease)     cath 12/13/10: mRCA 99%, EF 65-70%;  s/p BMS to mRCA  . HTN (hypertension)   . DM2 (diabetes mellitus, type 2)   . Hyperlipidemia   . MI (myocardial infarction)    Past Surgical History  Procedure Laterality Date  . Coronary angioplasty with stent placement    . Carpal tunnel release      w/ bone spurs  . Dilation and curettage of uterus     Family History  Problem Relation Age of Onset  . Diabetes Mother   . Hypertension Mother   . Diabetes Father   . Hypertension Father    History  Substance Use Topics  . Smoking status: Former Smoker -- 0.50 packs/day for 35 years    Types: Cigarettes    Quit date: 05/19/2014  . Smokeless tobacco: Current User     Comment: Quit x 1 week.  Does 4 puffs and then again then done.  Has an E-cigerrette  .  Alcohol Use: No   OB History    No data available     Review of Systems  Constitutional: Negative for fever.  Skin: Positive for wound.  Neurological: Negative for dizziness.  All other systems reviewed and are negative.     Allergies  Nsaids and Tramadol hcl  Home Medications   Prior to Admission medications   Medication Sig Start Date End Date Taking? Authorizing Provider  acetaminophen (TYLENOL) 325 MG tablet Take 650 mg by mouth every 4 (four) hours as needed for mild pain.    Historical Provider, MD  albuterol (PROVENTIL HFA;VENTOLIN HFA) 108 (90 BASE) MCG/ACT inhaler Inhale 1-2 puffs into the lungs every 6 (six) hours as needed for wheezing or shortness of breath. 05/20/14   Wandra Arthurs, MD  aspirin 81 MG chewable tablet Chew 81 mg by mouth daily.    Historical Provider, MD  atorvastatin (LIPITOR) 80 MG tablet Take 80 mg by mouth daily.     Historical Provider, MD  azithromycin (ZITHROMAX Z-PAK) 250 MG tablet 2 po day one, then 1 daily x 4 days 05/20/14   Wandra Arthurs, MD  Blood Glucose Monitoring Suppl DEVI 1 Device by Does not apply route daily before breakfast. 05/23/14  Ejiroghene Arlyce Dice, MD  chlorpheniramine-HYDROcodone (TUSSIONEX PENNKINETIC ER) 10-8 MG/5ML LQCR Take 5 mLs by mouth every 12 (twelve) hours as needed for cough (Cough). 05/23/14   Ejiroghene Arlyce Dice, MD  cyclobenzaprine (FLEXERIL) 10 MG tablet TAKE 1 TABLET (10 MG TOTAL) BY MOUTH 3 (THREE) TIMES DAILY AS NEEDED FOR MUSCLE SPASMS. Patient not taking: Reported on 05/20/2014 03/28/14   Jerrye Noble, MD  Ferrous Sulfate (IRON) 325 (65 FE) MG TABS Take 325 mg by mouth daily. Patient not taking: Reported on 05/20/2014 02/27/14   Ejiroghene Arlyce Dice, MD  Fluticasone-Salmeterol (ADVAIR) 100-50 MCG/DOSE AEPB Inhale 2 puffs into the lungs 2 (two) times daily.    Historical Provider, MD  glipiZIDE (GLUCOTROL) 5 MG tablet TAKE 1 TABLET BY MOUTH DAILY BEFORE BREAKFAST 06/19/14   Jerrye Noble, MD  glipiZIDE (GLUCOTROL) 5  MG tablet TAKE 1 TABLET BY MOUTH DAILY BEFORE BREAKFAST 06/20/14   Jerrye Noble, MD  Glucose Blood (BLOOD GLUCOSE TEST STRIPS) STRP 1 strip by In Vitro route daily before breakfast. 05/23/14   Ejiroghene E Denton Brick, MD  hydrocortisone valerate ointment (WEST-CORT) 0.2 % Apply 1 application topically 2 (two) times daily.    Historical Provider, MD  Lancets MISC 1 Units by Does not apply route daily. 05/23/14   Ejiroghene Arlyce Dice, MD  levothyroxine (SYNTHROID, LEVOTHROID) 150 MCG tablet TAKE 1 TABLET BY MOUTH DAILY BEFORE BREAKFAST. 05/05/14   Jerrye Noble, MD  levothyroxine (SYNTHROID, LEVOTHROID) 25 MCG tablet Take 25 mcg by mouth daily before breakfast.    Historical Provider, MD  lisinopril (PRINIVIL,ZESTRIL) 10 MG tablet Take 10 mg by mouth daily.    Historical Provider, MD  meclizine (ANTIVERT) 12.5 MG tablet Take 1 tablet (12.5 mg total) by mouth 3 (three) times daily as needed for dizziness. 05/20/14   Wandra Arthurs, MD  metFORMIN (GLUCOPHAGE) 1000 MG tablet Take 1 tablet twice a day 03/29/14   Jerrye Noble, MD  metoprolol succinate (TOPROL-XL) 50 MG 24 hr tablet TAKE 1 TABLET BY MOUTH DAILY. 05/05/14   Dorothy Spark, MD  nitroGLYCERIN (NITROSTAT) 0.4 MG SL tablet Place 1 tablet (0.4 mg total) under the tongue every 5 (five) minutes as needed for chest pain. 04/05/14   Dorothy Spark, MD  Omega-3 Fatty Acids (FISH OIL) 1000 MG CAPS Take 1 capsule by mouth daily.    Historical Provider, MD  omeprazole (PRILOSEC) 40 MG capsule Take 40 mg by mouth daily.    Historical Provider, MD  sertraline (ZOLOFT) 50 MG tablet TAKE 3 TABLETS (150 MG TOTAL) BY MOUTH DAILY. 06/23/14   Jerrye Noble, MD  sulfamethoxazole-trimethoprim (BACTRIM DS,SEPTRA DS) 800-160 MG per tablet Take 1 tablet by mouth 2 (two) times daily. 05/23/14   Oval Linsey, MD  triamcinolone (NASACORT AQ) 55 MCG/ACT AERO nasal inhaler Place 2 sprays into the nose daily.    Historical Provider, MD   BP 117/61 mmHg  Pulse 94  Temp(Src) 98.2 F  (36.8 C) (Oral)  Resp 20  SpO2 94% Physical Exam  Constitutional: She appears well-developed and well-nourished.  HENT:  Head: Normocephalic.  Eyes: Pupils are equal, round, and reactive to light.  Neck: Normal range of motion.  Cardiovascular: Normal rate.   Pulmonary/Chest: Effort normal.  Abdominal: Soft.  Musculoskeletal: Normal range of motion.       Legs: Neurological: She is alert.  Skin: Skin is warm. No erythema.  Nursing note and vitals reviewed.   ED Course  Procedures (including critical care time) Labs  Review Labs Reviewed - No data to display  Imaging Review No results found.   EKG Interpretation None     Home care will be performed.  Steri-Strips will be applied, loosely.  No sutures will be placed at this time as the wound has been open greater than 36 hours.  Tetanus will be updated MDM   Final diagnoses:  Laceration of leg, right, initial encounter         Junius Creamer, NP 06/28/14 [place], DO 06/28/14 3403

## 2014-06-28 NOTE — ED Notes (Signed)
Pt presents with c/o right leg laceration. Pt reports she cut her leg on some hedge trimmers. Wound is covered at this time, bleeding controlled.

## 2014-06-28 NOTE — Discharge Instructions (Signed)
°  Wound was closed with Steri-Strips as it had been open greater than 36 hours.  Please watch area for any sign of infections, redness, pain, possible.  At this time.  None of these are present.  Your tetanus has been updated

## 2014-07-05 ENCOUNTER — Other Ambulatory Visit: Payer: Self-pay | Admitting: Internal Medicine

## 2014-07-05 ENCOUNTER — Encounter: Payer: Self-pay | Admitting: Internal Medicine

## 2014-07-05 ENCOUNTER — Telehealth: Payer: Self-pay | Admitting: *Deleted

## 2014-07-05 ENCOUNTER — Ambulatory Visit (INDEPENDENT_AMBULATORY_CARE_PROVIDER_SITE_OTHER): Payer: Self-pay | Admitting: Internal Medicine

## 2014-07-05 VITALS — BP 100/60 | Temp 98.0°F | Ht 63.0 in | Wt 251.8 lb

## 2014-07-05 DIAGNOSIS — G8929 Other chronic pain: Secondary | ICD-10-CM | POA: Insufficient documentation

## 2014-07-05 DIAGNOSIS — N179 Acute kidney failure, unspecified: Secondary | ICD-10-CM | POA: Insufficient documentation

## 2014-07-05 DIAGNOSIS — E119 Type 2 diabetes mellitus without complications: Secondary | ICD-10-CM

## 2014-07-05 DIAGNOSIS — E118 Type 2 diabetes mellitus with unspecified complications: Secondary | ICD-10-CM

## 2014-07-05 DIAGNOSIS — Y9389 Activity, other specified: Secondary | ICD-10-CM

## 2014-07-05 DIAGNOSIS — R34 Anuria and oliguria: Secondary | ICD-10-CM

## 2014-07-05 DIAGNOSIS — R32 Unspecified urinary incontinence: Secondary | ICD-10-CM

## 2014-07-05 DIAGNOSIS — W278XXA Contact with other nonpowered hand tool, initial encounter: Secondary | ICD-10-CM

## 2014-07-05 DIAGNOSIS — R109 Unspecified abdominal pain: Secondary | ICD-10-CM

## 2014-07-05 DIAGNOSIS — S81811A Laceration without foreign body, right lower leg, initial encounter: Secondary | ICD-10-CM

## 2014-07-05 DIAGNOSIS — S81801A Unspecified open wound, right lower leg, initial encounter: Secondary | ICD-10-CM | POA: Insufficient documentation

## 2014-07-05 DIAGNOSIS — I10 Essential (primary) hypertension: Secondary | ICD-10-CM

## 2014-07-05 DIAGNOSIS — S81801D Unspecified open wound, right lower leg, subsequent encounter: Secondary | ICD-10-CM

## 2014-07-05 DIAGNOSIS — R42 Dizziness and giddiness: Secondary | ICD-10-CM

## 2014-07-05 DIAGNOSIS — N39498 Other specified urinary incontinence: Secondary | ICD-10-CM

## 2014-07-05 DIAGNOSIS — R197 Diarrhea, unspecified: Secondary | ICD-10-CM

## 2014-07-05 DIAGNOSIS — Y92009 Unspecified place in unspecified non-institutional (private) residence as the place of occurrence of the external cause: Secondary | ICD-10-CM

## 2014-07-05 DIAGNOSIS — M549 Dorsalgia, unspecified: Secondary | ICD-10-CM

## 2014-07-05 LAB — CBC WITH DIFFERENTIAL/PLATELET
Basophils Absolute: 0 10*3/uL (ref 0.0–0.1)
Basophils Relative: 1 % (ref 0–1)
Eosinophils Absolute: 0.3 10*3/uL (ref 0.0–0.7)
Eosinophils Relative: 4 % (ref 0–5)
HCT: 42.7 % (ref 36.0–46.0)
Hemoglobin: 14.6 g/dL (ref 12.0–15.0)
Lymphocytes Relative: 33 % (ref 12–46)
Lymphs Abs: 2.4 10*3/uL (ref 0.7–4.0)
MCH: 30.7 pg (ref 26.0–34.0)
MCHC: 34.2 g/dL (ref 30.0–36.0)
MCV: 89.7 fL (ref 78.0–100.0)
Monocytes Absolute: 0.7 10*3/uL (ref 0.1–1.0)
Monocytes Relative: 10 % (ref 3–12)
Neutro Abs: 3.8 10*3/uL (ref 1.7–7.7)
Neutrophils Relative %: 52 % (ref 43–77)
Platelets: 243 10*3/uL (ref 150–400)
RBC: 4.76 MIL/uL (ref 3.87–5.11)
RDW: 14.1 % (ref 11.5–15.5)
WBC: 7.2 10*3/uL (ref 4.0–10.5)

## 2014-07-05 LAB — COMPREHENSIVE METABOLIC PANEL
ALT: 49 U/L — ABNORMAL HIGH (ref 0–35)
AST: 46 U/L — ABNORMAL HIGH (ref 0–37)
Albumin: 3.8 g/dL (ref 3.5–5.2)
Alkaline Phosphatase: 105 U/L (ref 39–117)
Anion gap: 9 (ref 5–15)
BUN: 21 mg/dL (ref 6–23)
CO2: 22 mmol/L (ref 19–32)
Calcium: 9.1 mg/dL (ref 8.4–10.5)
Chloride: 106 mmol/L (ref 96–112)
Creatinine, Ser: 1.11 mg/dL — ABNORMAL HIGH (ref 0.50–1.10)
GFR calc Af Amer: 64 mL/min — ABNORMAL LOW (ref >90)
GFR calc non Af Amer: 55 mL/min — ABNORMAL LOW (ref >90)
Glucose, Bld: 153 mg/dL — ABNORMAL HIGH (ref 70–99)
Potassium: 3.8 mmol/L (ref 3.5–5.1)
Sodium: 137 mmol/L (ref 135–145)
Total Bilirubin: 0.6 mg/dL (ref 0.3–1.2)
Total Protein: 7.1 g/dL (ref 6.0–8.3)

## 2014-07-05 LAB — GLUCOSE, CAPILLARY: Glucose-Capillary: 155 mg/dL — ABNORMAL HIGH (ref 70–99)

## 2014-07-05 MED ORDER — MUPIROCIN CALCIUM 2 % EX CREA: TOPICAL_CREAM | CUTANEOUS | Status: DC

## 2014-07-05 MED ORDER — DOXYCYCLINE HYCLATE 100 MG PO TABS: 100.0000 mg | ORAL_TABLET | Freq: Two times a day (BID) | ORAL | Status: DC

## 2014-07-05 MED ORDER — ONDANSETRON HCL 4 MG PO TABS: 4.0000 mg | ORAL_TABLET | Freq: Three times a day (TID) | ORAL | Status: DC | PRN

## 2014-07-05 MED ORDER — METRONIDAZOLE 500 MG PO TABS: 500.0000 mg | ORAL_TABLET | Freq: Three times a day (TID) | ORAL | Status: DC

## 2014-07-05 NOTE — Progress Notes (Signed)
   Subjective:    Patient ID: Kendra Adams, female    DOB: 1960-04-06, 54 y.o.   MRN: [id number]  HPI Comments: 54 y.o pmh E. Coli UTI, obesity, hypothyroidism, HLD, GERD, CAD, HTN, DM 2, CAD, depression, asthma, allergic rhinitis   She presents for  1. Diarrhea since Sunday where she was going every 15 minutes yellow watery and Monday every 2 hours.  Her last episode was Tuesday where she had diarrhea x3 in a 3 hour period and 6x total.  Today she felt like diarrhea was coming but has not had diarrhea.  Last time she had diarrhea it was still pale yellow.  She has had associated upper abdomen tightness and lower abdomen sharp intermittent pain. She also has had nausea w/o vomiting. She does not notice association with Metformin 1000 mg bid but the dose was increased a few months ago.  She denies sick contacts. She had recent Zpack 3/12 and Bactrim 3/15 x 5 days for UTI. She ate fried eggs whites at [facility] [address] on Saturday and her husband had eggs as well and did not get sick.    2. Concern for UTI. She feels like her UTI is not all the way gone but denies dysuria, increased frequency or urgency. She is having urine incontinence.  She has been doing kegel exercises but still peeing on herself Sunday am.  She also feels like she has decreased urine output and dribbling when she does urinate.  She has had decreased oral intake since feeling ill (with above) and trying to stay hydrated.  She has only urinated 2 x today  3. Right leg wound/laceration in DM 2 (A1C last 7.9 on 3/11) 4/20 when carrying outdoor tools in the house. She went to the ED and was given Tdap.  She is using OTC ointment on the leg.    4. Chronic low back pain 02/2014 Xray with osteophytes.  Today 6/10.  She reports leg weakness and limping b/l but denies saddle anesthesia.       Review of Systems  Constitutional: Positive for chills. Negative for fever.       +chills Sunday  Gastrointestinal: Positive for nausea, abdominal  pain and diarrhea. Negative for vomiting and blood in stool.       +hypersalivation   Neurological: Positive for light-headedness.       Resolved lightheadedness noted Sunday and Monday       Objective:   Physical Exam  Constitutional: She is oriented to person, place, and time. She appears well-developed and well-nourished. She is cooperative. No distress.  HENT:  Head: Normocephalic and atraumatic.  Mouth/Throat: Abnormal dentition. No oropharyngeal exudate.  Eyes: Conjunctivae are normal. Right eye exhibits no discharge. Left eye exhibits no discharge. No scleral icterus.  Cardiovascular: Normal rate, regular rhythm and normal heart sounds.   No murmur heard. No leg edema   Pulmonary/Chest: Effort normal and breath sounds normal. No respiratory distress. She has no wheezes.  Abdominal: Soft. Bowel sounds are normal. There is no tenderness.  Neurological: She is alert and oriented to person, place, and time.  Limping gait   Skin: Skin is warm and dry. She is not diaphoretic.     Psychiatric: She has a normal mood and affect. Her speech is normal and behavior is normal. Judgment and thought content normal. Cognition and memory are normal.  Nursing note and vitals reviewed.         Assessment & Plan:  F/u in 1-2 weeks

## 2014-07-05 NOTE — Assessment & Plan Note (Signed)
Appears like onset cellulitis  Try Doxycycline 100 mg bid x 1 week, Flagyl 500 mg tid x 7 days, Bactroban F/u in 1-2 weeks

## 2014-07-05 NOTE — Assessment & Plan Note (Addendum)
Will r/o AKI, check for infection UA ,culture  Bladder scan broken so cant do today Advised to stay hydrated

## 2014-07-05 NOTE — Patient Instructions (Signed)
General Instructions: Please follow up in 1-2 weeks  You can take Zofran for nausea You can take Doxycycline and Flagyl and bactroban for your leg wound. Please come back in 1-2 weeks so we can see  Stop Lisinopril for now  Take care    Treatment Goals:  Goals (1 Years of Data) as of 07/05/14    None      Progress Toward Treatment Goals:  Treatment Goal 07/05/2014  Hemoglobin A1C unchanged  Blood pressure at goal    Self Care Goals & Plans:  Self Care Goal 07/05/2014  Manage my medications take my medicines as prescribed; bring my medications to every visit; refill my medications on time; follow the sick day instructions if I am sick  Monitor my health keep track of my blood glucose; bring my glucose meter and log to each visit; keep track of my blood pressure; bring my blood pressure log to each visit; keep track of my weight; check my feet daily  Eat healthy foods drink diet soda or water instead of juice or soda; eat more vegetables; eat foods that are low in salt; eat baked foods instead of fried foods; eat smaller portions; eat fruit for snacks and desserts  Be physically active find an activity I enjoy  Meeting treatment goals maintain the current self-care plan    No flowsheet data found.   Care Management & Community Referrals:  Referral 07/05/2014  Referrals made for care management support -  Referrals made to community resources none       Abdominal Pain Many things can cause abdominal pain. Usually, abdominal pain is not caused by a disease and will improve without treatment. It can often be observed and treated at home. Your health care provider will do a physical exam and possibly order blood tests and X-rays to help determine the seriousness of your pain. However, in many cases, more time must pass before a clear cause of the pain can be found. Before that point, your health care provider may not know if you need more testing or further treatment. HOME CARE  INSTRUCTIONS  Monitor your abdominal pain for any changes. The following actions may help to alleviate any discomfort you are experiencing:  Only take over-the-counter or prescription medicines as directed by your health care provider.  Do not take laxatives unless directed to do so by your health care provider.  Try a clear liquid diet (broth, tea, or water) as directed by your health care provider. Slowly move to a bland diet as tolerated. SEEK MEDICAL CARE IF:  You have unexplained abdominal pain.  You have abdominal pain associated with nausea or diarrhea.  You have pain when you urinate or have a bowel movement.  You experience abdominal pain that wakes you in the night.  You have abdominal pain that is worsened or improved by eating food.  You have abdominal pain that is worsened with eating fatty foods.  You have a fever. SEEK IMMEDIATE MEDICAL CARE IF:   Your pain does not go away within 2 hours.  You keep throwing up (vomiting).  Your pain is felt only in portions of the abdomen, such as the right side or the left lower portion of the abdomen.  You pass bloody or black tarry stools. MAKE SURE YOU:  Understand these instructions.   Will watch your condition.   Will get help right away if you are not doing well or get worse.  Document Released: 12/04/2004 Document Revised: 03/01/2013 Document Reviewed: 11/03/2012  [employer] Patient Information 2015 [place]. This information is not intended to replace advice given to you by your health care provider. Make sure you discuss any questions you have with your health care provider.  Diarrhea Diarrhea is frequent loose and watery bowel movements. It can cause you to feel weak and dehydrated. Dehydration can cause you to become tired and thirsty, have a dry mouth, and have decreased urination that often is dark yellow. Diarrhea is a sign of another problem, most often an infection that will not last long. In most  cases, diarrhea typically lasts 2-3 days. However, it can last longer if it is a sign of something more serious. It is important to treat your diarrhea as directed by your caregiver to lessen or prevent future episodes of diarrhea. CAUSES  Some common causes include:  Gastrointestinal infections caused by viruses, bacteria, or parasites.  Food poisoning or food allergies.  Certain medicines, such as antibiotics, chemotherapy, and laxatives.  Artificial sweeteners and fructose.  Digestive disorders. HOME CARE INSTRUCTIONS  Ensure adequate fluid intake (hydration): Have 1 cup (8 oz) of fluid for each diarrhea episode. Avoid fluids that contain simple sugars or sports drinks, fruit juices, whole milk products, and sodas. Your urine should be clear or pale yellow if you are drinking enough fluids. Hydrate with an oral rehydration solution that you can purchase at pharmacies, retail stores, and online. You can prepare an oral rehydration solution at home by mixing the following ingredients together:   - tsp table salt.   tsp baking soda.   tsp salt substitute containing potassium chloride.  1  tablespoons sugar.  1 L (34 oz) of water.  Certain foods and beverages may increase the speed at which food moves through the gastrointestinal (GI) tract. These foods and beverages should be avoided and include:  Caffeinated and alcoholic beverages.  High-fiber foods, such as raw fruits and vegetables, nuts, seeds, and whole grain breads and cereals.  Foods and beverages sweetened with sugar alcohols, such as xylitol, sorbitol, and mannitol.  Some foods may be well tolerated and may help thicken stool including:  Starchy foods, such as rice, toast, pasta, low-sugar cereal, oatmeal, grits, baked potatoes, crackers, and bagels.  Bananas.  Applesauce.  Add probiotic-rich foods to help increase healthy bacteria in the GI tract, such as yogurt and fermented milk products.  Wash your hands  well after each diarrhea episode.  Only take over-the-counter or prescription medicines as directed by your caregiver.  Take a warm bath to relieve any burning or pain from frequent diarrhea episodes. SEEK IMMEDIATE MEDICAL CARE IF:   You are unable to keep fluids down.  You have persistent vomiting.  You have blood in your stool, or your stools are black and tarry.  You do not urinate in 6-8 hours, or there is only a small amount of very dark urine.  You have abdominal pain that increases or localizes.  You have weakness, dizziness, confusion, or light-headedness.  You have a severe headache.  Your diarrhea gets worse or does not get better.  You have a fever or persistent symptoms for more than 2-3 days.  You have a fever and your symptoms suddenly get worse. MAKE SURE YOU:   Understand these instructions.  Will watch your condition.  Will get help right away if you are not doing well or get worse. Document Released: 02/14/2002 Document Revised: 07/11/2013 Document Reviewed: 11/02/2011 [facility] Patient Information 2015 [place], [place]. This information is not intended to replace advice given  to you by your health care provider. Make sure you discuss any questions you have with your health care provider.

## 2014-07-05 NOTE — Assessment & Plan Note (Addendum)
Concern for gastroenteritis, C.diff, ? Related to Metformin use Will check CMET, CBC, stool culture/c. diff Will given prn Zofran for nausea Lightheaded resolved checked orthostatics neg 100/52 HR 77, 100/52 HR 82, 100/55 HR 83. Stay hydrated  Hold Lisinopril with diarrhea

## 2014-07-05 NOTE — Assessment & Plan Note (Signed)
Lab Results  Component Value Date   HGBA1C 7.9 05/19/2014   HGBA1C 7.9 02/10/2014   HGBA1C 7.3 04/22/2013     Assessment: Diabetes control: fair control Progress toward A1C goal:  unchanged Comments: none  Plan: Medications:  continue current medications continue Glipizide 5 mg, Metformin 1000 mg bid consider decreasing dose if notices diarrhea assoc. With high dose Metformin Instruction/counseling given: no instruction/counseling  Other plans: f/u in 2 weeks

## 2014-07-05 NOTE — Assessment & Plan Note (Signed)
?   Etiology  Will check UA, culture to r/o infection. Maybe related to DM (i.e overflow but does not sound like it)

## 2014-07-05 NOTE — Telephone Encounter (Signed)
Pt called - since  07/02/14 urine output has decreased. Has had diarrhea since 07/02/14 - better now - watery BM about every 2 hours. Has had 3 meals since 07/02/14 - has not checked CBG. Denies vomiting and temp. Appt made 07/05/14 2:15PM Dr Loma Sousa. Hilda Blades Callahan Peddie RN 07/05/14 9:30AM

## 2014-07-05 NOTE — Assessment & Plan Note (Signed)
Likely due to osteophytes on Xray 02/2014  No alarms. Pt does have subjective leg weakness but none significant on physical exam, she has baseline urinary incontinence but no saddle anesthesia  Address at f/u in 1-2 weeks

## 2014-07-05 NOTE — Assessment & Plan Note (Signed)
BP Readings from Last 3 Encounters:  07/05/14 100/60  06/28/14 117/61  05/23/14 134/83    Lab Results  Component Value Date   NA 138 05/19/2014   K 3.9 05/19/2014   CREATININE 0.75 05/19/2014    Assessment: Blood pressure control: controlled Progress toward BP goal:  at goal Comments: hypotensive today  Plan: Medications:  continue current medications hold Lisinopril 10 mg, continue Toprol XL 50 mg qd  Other plans: check CMET today

## 2014-07-06 LAB — URINALYSIS, MICROSCOPIC ONLY: Crystals: NONE SEEN

## 2014-07-06 LAB — URINALYSIS, ROUTINE W REFLEX MICROSCOPIC
Bilirubin Urine: NEGATIVE
Glucose, UA: NEGATIVE mg/dL
Hgb urine dipstick: NEGATIVE
Ketones, ur: NEGATIVE mg/dL
Leukocytes, UA: NEGATIVE
Nitrite: NEGATIVE
Protein, ur: 30 mg/dL — AB
Specific Gravity, Urine: 1.02 (ref 1.005–1.030)
Urobilinogen, UA: 0.2 mg/dL (ref 0.0–1.0)
pH: 6 (ref 5.0–8.0)

## 2014-07-06 LAB — C. DIFFICILE GDH AND TOXIN A/B
C. difficile GDH: NOT DETECTED
C. difficile Toxin A/B: NOT DETECTED

## 2014-07-07 LAB — URINE CULTURE: Colony Count: 75000

## 2014-07-09 LAB — STOOL CULTURE

## 2014-07-09 NOTE — Progress Notes (Signed)
Case discussed with Dr. Aundra Dubin at time of visit.  We reviewed the resident's history and exam and pertinent patient test results.  I agree with the assessment, diagnosis, and plan of care documented in the resident's note.

## 2014-07-17 ENCOUNTER — Ambulatory Visit (INDEPENDENT_AMBULATORY_CARE_PROVIDER_SITE_OTHER): Payer: Self-pay | Admitting: Internal Medicine

## 2014-07-17 VITALS — BP 133/76 | HR 88 | Temp 98.3°F | Ht 63.0 in | Wt 258.1 lb

## 2014-07-17 DIAGNOSIS — G2581 Restless legs syndrome: Secondary | ICD-10-CM

## 2014-07-17 MED ORDER — GABAPENTIN 300 MG PO CAPS: 300.0000 mg | ORAL_CAPSULE | Freq: Three times a day (TID) | ORAL | Status: DC

## 2014-07-17 NOTE — Patient Instructions (Signed)
Restless Legs Syndrome Restless legs syndrome is a movement disorder. It may also be called a sensorimotor disorder.  CAUSES  No one knows what specifically causes restless legs syndrome, but it tends to run in families. It is also more common in people with low iron, in pregnancy, in people who need dialysis, and those with nerve damage (neuropathy).Some medications may make restless legs syndrome worse.Those medications include drugs to treat high blood pressure, some heart conditions, nausea, colds, allergies, and depression. SYMPTOMS Symptoms include uncomfortable sensations in the legs. These leg sensations are worse during periods of inactivity or rest. They are also worse while sitting or lying down. Individuals that have the disorder describe sensations in the legs that feel like:  Pulling.  Drawing.  Crawling.  Worming.  Boring.  Tingling.  Pins and needles.  Prickling.  Pain. The sensations are usually accompanied by an overwhelming urge to move the legs. Sudden muscle jerks may also occur. Movement provides temporary relief from the discomfort. In rare cases, the arms may also be affected. Symptoms may interfere with going to sleep (sleep onset insomnia). Restless legs syndrome may also be related to periodic limb movement disorder (PLMD). PLMD is another more common motor disorder. It also causes interrupted sleep. The symptoms from PLMD usually occur most often when you are awake. TREATMENT  Treatment for restless legs syndrome is symptomatic. This means that the symptoms are treated.   Massage and cold compresses may provide temporary relief.  Walk, stretch, or take a cold or hot bath.  Get regular exercise and a good night's sleep.  Avoid caffeine, alcohol, nicotine, and medications that can make it worse.  Do activities that provide mental stimulation like discussions, needlework, and video games. These may be helpful if you are not able to walk or stretch. Some  medications are effective in relieving the symptoms. However, many of these medications have side effects. Ask your caregiver about medications that may help your symptoms. Correcting iron deficiency may improve symptoms for some patients. Document Released: 02/14/2002 Document Revised: 07/11/2013 Document Reviewed: 05/23/2010 [facility] Patient Information 2015 [place], [place]. This information is not intended to replace advice given to you by your health care provider. Make sure you discuss any questions you have with your health care provider.  General Instructions:   Please bring your medicines with you each time you come to clinic.  Medicines may include prescription medications, over-the-counter medications, herbal remedies, eye drops, vitamins, or other pills.   Progress Toward Treatment Goals:  Treatment Goal 07/05/2014  Hemoglobin A1C unchanged  Blood pressure at goal    Self Care Goals & Plans:  Self Care Goal 07/17/2014  Manage my medications bring my medications to every visit; take my medicines as prescribed; refill my medications on time  Monitor my health -  Eat healthy foods drink diet soda or water instead of juice or soda; eat more vegetables; eat foods that are low in salt; eat baked foods instead of fried foods; eat fruit for snacks and desserts  Be physically active -  Meeting treatment goals -    No flowsheet data found.   Care Management & Community Referrals:  Referral 07/05/2014  Referrals made for care management support -  Referrals made to community resources none

## 2014-07-17 NOTE — Progress Notes (Signed)
Subjective:   Patient ID: Kendra Adams female   DOB: 12-22-1960 54 y.o.   MRN: [id number]  HPI: [name] is a 54 y.o. woman with past medical history as below who presents for ongoing restless leg syndromes.  The patient states that she started to notice some tingling in her lower extremities. They seem to be worse at night. She has had this previously but with less intensity. The patient has not taken anything over-the-counter. Is not associated with any back pain, saddle anesthesia, or numbness. The patient states that she just feels very restless at times and walking seems to relieve the tension in her legs. This restlessness can happen both during the day and at night. She's had no fever or rashes or tick bites as she works in Teacher, adult education care.    Past Medical History  Diagnosis Date  . Carpal tunnel syndrome on both sides     right hand surgery 1998, left hand surgery 1999.  . Allergic rhinitis   . Asthma   . Depression   . GERD (gastroesophageal reflux disease)   . Hyperlipidemia   . Hypothyroidism   . Psoriasis   . CAD (coronary artery disease)     cath 12/13/10: mRCA 99%, EF 65-70%;  s/p BMS to mRCA  . HTN (hypertension)   . DM2 (diabetes mellitus, type 2)   . Hyperlipidemia   . MI (myocardial infarction)    Current Outpatient Prescriptions  Medication Sig Dispense Refill  . acetaminophen (TYLENOL) 325 MG tablet Take 650 mg by mouth every 4 (four) hours as needed for mild pain.    Marland Kitchen albuterol (PROVENTIL HFA;VENTOLIN HFA) 108 (90 BASE) MCG/ACT inhaler Inhale 1-2 puffs into the lungs every 6 (six) hours as needed for wheezing or shortness of breath. 1 Inhaler 0  . aspirin 81 MG chewable tablet Chew 81 mg by mouth daily.    Marland Kitchen atorvastatin (LIPITOR) 80 MG tablet Take 80 mg by mouth daily.     Marland Kitchen azithromycin (ZITHROMAX Z-PAK) 250 MG tablet 2 po day one, then 1 daily x 4 days 5 tablet 0  . Blood Glucose Monitoring Suppl DEVI 1 Device by Does not apply route daily before  breakfast. 1 each 0  . chlorpheniramine-HYDROcodone (TUSSIONEX PENNKINETIC ER) 10-8 MG/5ML LQCR Take 5 mLs by mouth every 12 (twelve) hours as needed for cough (Cough). 115 mL 0  . cyclobenzaprine (FLEXERIL) 10 MG tablet TAKE 1 TABLET (10 MG TOTAL) BY MOUTH 3 (THREE) TIMES DAILY AS NEEDED FOR MUSCLE SPASMS. (Patient not taking: Reported on 05/20/2014) 30 tablet 0  . doxycycline (VIBRA-TABS) 100 MG tablet Take 1 tablet (100 mg total) by mouth 2 (two) times daily. 14 tablet 0  . Ferrous Sulfate (IRON) 325 (65 FE) MG TABS Take 325 mg by mouth daily. (Patient not taking: Reported on 05/20/2014) 30 each 0  . Fluticasone-Salmeterol (ADVAIR) 100-50 MCG/DOSE AEPB Inhale 2 puffs into the lungs 2 (two) times daily.    Marland Kitchen glipiZIDE (GLUCOTROL) 5 MG tablet TAKE 1 TABLET BY MOUTH DAILY BEFORE BREAKFAST 30 tablet 0  . glipiZIDE (GLUCOTROL) 5 MG tablet TAKE 1 TABLET BY MOUTH DAILY BEFORE BREAKFAST 30 tablet 0  . Glucose Blood (BLOOD GLUCOSE TEST STRIPS) STRP 1 strip by In Vitro route daily before breakfast. 100 each 0  . hydrocortisone valerate ointment (WEST-CORT) 0.2 % Apply 1 application topically 2 (two) times daily.    . Lancets MISC 1 Units by Does not apply route daily. 50 each 0  . levothyroxine (  SYNTHROID, LEVOTHROID) 150 MCG tablet TAKE 1 TABLET BY MOUTH DAILY BEFORE BREAKFAST. 30 tablet 11  . levothyroxine (SYNTHROID, LEVOTHROID) 25 MCG tablet Take 25 mcg by mouth daily before breakfast.    . lisinopril (PRINIVIL,ZESTRIL) 10 MG tablet Take 10 mg by mouth daily.    . meclizine (ANTIVERT) 12.5 MG tablet Take 1 tablet (12.5 mg total) by mouth 3 (three) times daily as needed for dizziness. 30 tablet 0  . metFORMIN (GLUCOPHAGE) 1000 MG tablet Take 1 tablet twice a day 60 tablet 2  . metoprolol succinate (TOPROL-XL) 50 MG 24 hr tablet TAKE 1 TABLET BY MOUTH DAILY. 30 tablet 4  . metroNIDAZOLE (FLAGYL) 500 MG tablet Take 1 tablet (500 mg total) by mouth 3 (three) times daily. 21 tablet 0  . mupirocin cream  (BACTROBAN) 2 % Apply to affected area 3 times daily 30 g 0  . nitroGLYCERIN (NITROSTAT) 0.4 MG SL tablet Place 1 tablet (0.4 mg total) under the tongue every 5 (five) minutes as needed for chest pain. 25 tablet 3  . Omega-3 Fatty Acids (FISH OIL) 1000 MG CAPS Take 1 capsule by mouth daily.    Marland Kitchen omeprazole (PRILOSEC) 40 MG capsule Take 40 mg by mouth daily.    . ondansetron (ZOFRAN) 4 MG tablet Take 1 tablet (4 mg total) by mouth every 8 (eight) hours as needed for nausea or vomiting. 20 tablet 0  . sertraline (ZOLOFT) 50 MG tablet TAKE 3 TABLETS (150 MG TOTAL) BY MOUTH DAILY. 30 tablet 2  . sulfamethoxazole-trimethoprim (BACTRIM DS,SEPTRA DS) 800-160 MG per tablet Take 1 tablet by mouth 2 (two) times daily. 10 tablet 0  . triamcinolone (NASACORT AQ) 55 MCG/ACT AERO nasal inhaler Place 2 sprays into the nose daily.     No current facility-administered medications for this visit.   Family History  Problem Relation Age of Onset  . Diabetes Mother   . Hypertension Mother   . Diabetes Father   . Hypertension Father    History   Social History  . Marital Status: Married    Spouse Name: N/A  . Number of Children: 0  . Years of Education: 10th grade   Occupational History  . Conservation officer, nature     self-employed  . 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 Smoking status: Former Smoker -- 0.50 packs/day for 35 years    Types: Cigarettes    Quit date: 05/19/2014  . Smokeless tobacco: Current User     Comment: Quit x 1 week.  Does 4 puffs and then again then done.  Has an E-cigerrette  . Alcohol Use: No  . Drug Use: No  . Sexual Activity: Not on file   Other Topics Concern  . Not on file   Social History Narrative   Father died of suicide (gunshot) in 54. 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       Patient lives with her husband, her husband's niece Kendra Adams who is mentally  retarded and her best friend and the friend's daughter and 6 dogs               Review of Systems: Pertinent items are noted in HPI. Objective:  Physical Exam: Filed Vitals:   07/17/14 1529  BP: 133/76  Pulse: 88  Temp: 98.3 F (36.8 C)  TempSrc: Oral  Height: 5'  3" (1.6 m)  Weight: 258 lb 1.6 oz (117.073 kg)  SpO2: 94%   General:NAD Cardiac: RRR, no rubs, murmurs or gallops Pulm: clear to auscultation bilaterally, moving normal volumes of air Abd: soft, nontender, nondistended, BS present Ext: warm and well perfused, no pedal edema, large well-healing scab over anterior right shin with no surrounding erythema or areas of fluctuance MSK: 5/5 LE strength, normal lower externally sensation, 2+ DTRs bilaterally Neuro: alert and oriented X3, cranial nerves II-XII grossly intact  Assessment & Plan:  Please see problem oriented charting  Pt discussed with Dr. Dareen Piano

## 2014-07-17 NOTE — Assessment & Plan Note (Signed)
Unsure if this is truly restless leg as patient has symptoms during the day and night. She does describe some sensation of tingling which may be misinterpreted neuropathy. The patient does have diabetes. Most recent CBC was normal and did not have any microcytic anemia, TSH was normal, CMet was normal. -A trial of gabapentin at night -Consider iron studies or ropinirole if patient continues to have symptoms

## 2014-07-18 ENCOUNTER — Other Ambulatory Visit: Payer: Self-pay | Admitting: Internal Medicine

## 2014-07-18 LAB — GLUCOSE, CAPILLARY: Glucose-Capillary: 124 mg/dL — ABNORMAL HIGH (ref 70–99)

## 2014-07-18 NOTE — Progress Notes (Signed)
INTERNAL MEDICINE TEACHING ATTENDING ADDENDUM - Loucinda Croy, MD: I reviewed and discussed at the time of visit with the resident Dr. Sadek, the patient's medical history, physical examination, diagnosis and results of pertinent tests and treatment and I agree with the patient's care as documented.  

## 2014-07-19 ENCOUNTER — Encounter: Payer: Self-pay | Admitting: *Deleted

## 2014-07-24 ENCOUNTER — Telehealth: Payer: Self-pay | Admitting: *Deleted

## 2014-07-24 NOTE — Telephone Encounter (Signed)
Pt called was taking gabapentin for restless legs - started with hives and swollen lips. Stopped meds and taking Benadryl every 6 hours - better. New med helped with restless legs. Offered an appt for today - has heart doctor this PM. Appt made with Dr Hulen Luster 07/25/14 3:15PM.  If hives and swollen lips gets worse - suggest to go to Urgent Care. Hilda Blades Muhammad Vacca RN 07/24/14 11:30AM

## 2014-07-25 ENCOUNTER — Ambulatory Visit (INDEPENDENT_AMBULATORY_CARE_PROVIDER_SITE_OTHER): Payer: Self-pay | Admitting: Internal Medicine

## 2014-07-25 ENCOUNTER — Encounter: Payer: Self-pay | Admitting: Internal Medicine

## 2014-07-25 VITALS — BP 129/71 | HR 85 | Temp 98.2°F | Ht 63.0 in | Wt 256.5 lb

## 2014-07-25 DIAGNOSIS — B373 Candidiasis of vulva and vagina: Secondary | ICD-10-CM | POA: Insufficient documentation

## 2014-07-25 DIAGNOSIS — R42 Dizziness and giddiness: Secondary | ICD-10-CM

## 2014-07-25 DIAGNOSIS — B3731 Acute candidiasis of vulva and vagina: Secondary | ICD-10-CM | POA: Insufficient documentation

## 2014-07-25 DIAGNOSIS — G2581 Restless legs syndrome: Secondary | ICD-10-CM

## 2014-07-25 MED ORDER — FLUCONAZOLE 150 MG PO TABS: 150.0000 mg | ORAL_TABLET | Freq: Every day | ORAL | Status: DC

## 2014-07-25 MED ORDER — ROPINIROLE HCL 0.25 MG PO TABS: ORAL_TABLET | ORAL | Status: DC

## 2014-07-25 MED ORDER — MECLIZINE HCL 12.5 MG PO TABS: 12.5000 mg | ORAL_TABLET | Freq: Three times a day (TID) | ORAL | Status: DC | PRN

## 2014-07-25 NOTE — Assessment & Plan Note (Signed)
Pt complaining of vaginal cottage cheese discharge since completing her abx course of doxy. It is associated with itching of labia and burning. She states it is similar to her previous yeast infxns.   - Will tx yeast infxn with diflucan 13m po x 1 tab

## 2014-07-25 NOTE — Progress Notes (Signed)
   Subjective:    Patient ID: Kendra Adams, female    DOB: [birth year], 54 y.o.   MRN: [id number]  HPI Pt is a 54 y/o female w/ PMHx of RLS, HTN, CAD, and vertigo who presents to clinic for RLS f/u. She is also complaining of vaginal cottage cheese discharge since completing her abx course of doxy. It is associated with itching of labia and burning. She states it is similar to her previous yeast infxns.     Review of Systems  Constitutional: Negative for fever, activity change and appetite change.  HENT: Positive for congestion and facial swelling.   Respiratory: Negative for shortness of breath.   Cardiovascular: Negative for chest pain.  Skin: Positive for rash.  Psychiatric/Behavioral:       Stress        Objective:   Physical Exam  Constitutional: She appears well-developed and well-nourished. No distress.  HENT:  Head: Normocephalic.  Mouth/Throat: Oropharynx is clear and moist. No oropharyngeal exudate.  Missing upper teeth   Eyes: Conjunctivae and EOM are normal. No scleral icterus.  Cardiovascular: Normal rate and regular rhythm.   Pulmonary/Chest: Effort normal. She has wheezes.  Skin: Skin is warm and dry. No rash noted.          Assessment & Plan:

## 2014-07-25 NOTE — Assessment & Plan Note (Signed)
Refilled meclizine for vertigo, has no complaints regarding vertigo today.

## 2014-07-25 NOTE — Assessment & Plan Note (Signed)
Pt was last seen in clinic 1 week ago for RLS. She was given gabapentin for RLS which pt took for 3 days. She states she then developed a skin rash that started off as red dots on her thighs and then progressed to her abdomen and she later develop lip swelling. She then stopped taking gabapentin and the rash and swelling subsided. Today in clinic pt is asymptomatic and does not have any rash.   - d/c'd gabapentin and added it to her allergy list - trial of ropinirole 0.36m 1-3 hours before bedtime x 2 days, then increase to 0.5571mx 7 days, then increase to 71m29mInstructed pt to call clinic for refill so that we can prescribe 71mg67mbs instead of 0.25mg65ms

## 2014-07-25 NOTE — Patient Instructions (Signed)
Ropinirole tablets What is this medicine? ROPINIROLE (roe PIN i role) is used to treat the symptoms of Parkinson's disease. It helps to improve muscle control and movement difficulties. It is also used for the treatment of Restless Legs Syndrome. This medicine may be used for other purposes; ask your health care provider or pharmacist if you have questions. COMMON BRAND NAME(S): Requip What should I tell my health care provider before I take this medicine? They need to know if you have any of these conditions: -dizzy or fainting spells -heart disease -high blood pressure -kidney disease -liver disease -low blood pressure -sleeping problems -an unusual or allergic reaction to ropinirole, other medicines, foods, dyes, or preservatives -pregnant or trying to get pregnant -breast-feeding How should I use this medicine? Take this medicine by mouth with a glass of water. Follow the directions on the prescription label. You can take it with or without food. If it upsets your stomach, take it with food. Take your doses at regular intervals. Do not take your medicine more often than directed. Do not stop taking this medicine except on your doctor's advice. Talk to your pediatrician regarding the use of this medicine in children. Special care may be needed. Overdosage: If you think you have taken too much of this medicine contact a poison control center or emergency room at once. NOTE: This medicine is only for you. Do not share this medicine with others. What if I miss a dose? If you miss a dose, take it as soon as you can. If it is almost time for your next dose, take only that dose. Do not take double or extra doses. What may interact with this medicine? -ciprofloxacin -female hormones, like estrogens and birth control pills -medicines for depression, anxiety, or psychotic disturbances -metoclopramide -mexiletine -norfloxacin -omeprazole This list may not describe all possible interactions.  Give your health care provider a list of all the medicines, herbs, non-prescription drugs, or dietary supplements you use. Also tell them if you smoke, drink alcohol, or use illegal drugs. Some items may interact with your medicine. What should I watch for while using this medicine? Visit your doctor or health care professional for regular checks on your progress. It may be several weeks or months before you feel the full effect of this medicine. You may get drowsy or dizzy. Do not drive, use machinery, or do anything that needs mental alertness until you know how this drug affects you. Do not stand or sit up quickly, especially if you are an older patient. This reduces the risk of dizzy or fainting spells. Alcohol can increase possible dizziness. Avoid alcoholic drinks. If you find that you have sudden feelings of wanting to sleep during normal activities, like cooking, watching television, or while driving or riding in a car, you should contact your health care professional. Your mouth may get dry. Chewing sugarless gum or sucking hard candy, and drinking plenty of water may help. Contact your doctor if the problem does not go away or is severe. There have been reports of increased sexual urges or other strong urges such as gambling while taking some medicines for Parkinson's disease. If you experience any of these urges while taking this medicine, you should report it to your health care provider as soon as possible. You should check your skin often for changes to moles and new growths while taking this medicine. Call your doctor if you notice any of these changes. What side effects may I notice from receiving this medicine? Side  effects that you should report to your doctor or health care professional as soon as possible: -changes in vision -chest pain -confusion -falling asleep during normal activities like driving -fast, irregular heartbeat -feeling faint or lightheaded, falls -hallucination,  loss of contact with reality -increase or decrease in blood pressure -joint or muscle pain -loss of bladder control -numbness, tingling, or prickly sensations -shortness of breath, troubled breathing, tightness in chest, or wheezing -suicidal thoughts or other mood changes -uncontrollable head, mouth, neck, arm, or leg movements -vomiting Side effects that usually do not require medical attention (report to your doctor or health care professional if they continue or are bothersome): -clumsiness, feeling unsteady, or dizziness, especially early in treatment -flushing -headache -increased sweating -nausea -tremor -yawning This list may not describe all possible side effects. Call your doctor for medical advice about side effects. You may report side effects to FDA at 1-800-FDA-1088. Where should I keep my medicine? Keep out of the reach of children. Store at room temperature between 20 and 25 degrees C (68 and 77 degrees F). Protect from light and moisture. Keep container tightly closed. Throw away any unused medicine after the expiration date. NOTE: This sheet is a summary. It may not cover all possible information. If you have questions about this medicine, talk to your doctor, pharmacist, or health care provider.  2015, Elsevier/Gold Standard. (2008-06-13 20:56:15)

## 2014-07-27 NOTE — Progress Notes (Signed)
Internal Medicine Clinic Attending  Case discussed with Dr. Truong at the time of the visit.  We reviewed the resident's history and exam and pertinent patient test results.  I agree with the assessment, diagnosis, and plan of care documented in the resident's note.  

## 2014-07-30 ENCOUNTER — Emergency Department (HOSPITAL_COMMUNITY)
Admission: EM | Admit: 2014-07-30 | Discharge: 2014-07-30 | Disposition: A | Discharge status: Home / Self Care | Payer: Self-pay | Source: Home / Self Care

## 2014-07-30 ENCOUNTER — Encounter (HOSPITAL_COMMUNITY): Payer: Self-pay | Admitting: Emergency Medicine

## 2014-07-30 ENCOUNTER — Emergency Department (INDEPENDENT_AMBULATORY_CARE_PROVIDER_SITE_OTHER): Payer: Self-pay

## 2014-07-30 DIAGNOSIS — S20212A Contusion of left front wall of thorax, initial encounter: Secondary | ICD-10-CM

## 2014-07-30 MED ORDER — IPRATROPIUM-ALBUTEROL 0.5-2.5 (3) MG/3ML IN SOLN
3.0000 mL | Freq: Once | RESPIRATORY_TRACT | Status: AC
  Administered 2014-07-30: 3 mL via RESPIRATORY_TRACT

## 2014-07-30 MED ORDER — HYDROCODONE-ACETAMINOPHEN 5-325 MG PO TABS: 1.0000 | ORAL_TABLET | Freq: Four times a day (QID) | ORAL | Status: DC | PRN

## 2014-07-30 MED ORDER — IPRATROPIUM-ALBUTEROL 0.5-2.5 (3) MG/3ML IN SOLN
RESPIRATORY_TRACT | Status: AC
  Filled 2014-07-30: qty 3

## 2014-07-30 NOTE — Discharge Instructions (Signed)
Thank you for coming in today. Call or go to the emergency room if you get worse, have trouble breathing, have chest pains, or palpitations.  Follow up with your doctor.  Use norco for severe pain.  Use the incentive spirometer  Chest Contusion A chest contusion is a deep bruise on your chest area. Contusions are the result of an injury that caused bleeding under the skin. A chest contusion may involve bruising of the skin, muscles, or ribs. The contusion may turn blue, purple, or yellow. Minor injuries will give you a painless contusion, but more severe contusions may stay painful and swollen for a few weeks. CAUSES  A contusion is usually caused by a blow, trauma, or direct force to an area of the body. SYMPTOMS   Swelling and redness of the injured area.  Discoloration of the injured area.  Tenderness and soreness of the injured area.  Pain. DIAGNOSIS  The diagnosis can be made by taking a history and performing a physical exam. An X-ray, CT scan, or MRI may be needed to determine if there were any associated injuries, such as broken bones (fractures) or internal injuries. TREATMENT  Often, the best treatment for a chest contusion is resting, icing, and applying cold compresses to the injured area. Deep breathing exercises may be recommended to reduce the risk of pneumonia. Over-the-counter medicines may also be recommended for pain control. HOME CARE INSTRUCTIONS   Put ice on the injured area.  Put ice in a plastic bag.  Place a towel between your skin and the bag.  Leave the ice on for 15-20 minutes, 03-04 times a day.  Only take over-the-counter or prescription medicines as directed by your caregiver. Your caregiver may recommend avoiding anti-inflammatory medicines (aspirin, ibuprofen, and naproxen) for 48 hours because these medicines may increase bruising.  Rest the injured area.  Perform deep-breathing exercises as directed by your caregiver.  Stop smoking if you  smoke.  Do not lift objects over 5 pounds (2.3 kg) for 3 days or longer if recommended by your caregiver. SEEK IMMEDIATE MEDICAL CARE IF:   You have increased bruising or swelling.  You have pain that is getting worse.  You have difficulty breathing.  You have dizziness, weakness, or fainting.  You have blood in your urine or stool.  You cough up or vomit blood.  Your swelling or pain is not relieved with medicines. MAKE SURE YOU:   Understand these instructions.  Will watch your condition.  Will get help right away if you are not doing well or get worse. Document Released: 11/19/2000 Document Revised: 11/19/2011 Document Reviewed: 08/18/2011 [facility] Patient Information 2015 [place], [place]. This information is not intended to replace advice given to you by your health care provider. Make sure you discuss any questions you have with your health care provider.

## 2014-07-30 NOTE — ED Notes (Signed)
Patient transported to X-ray 

## 2014-07-30 NOTE — ED Provider Notes (Signed)
Kendra Adams is a 54 y.o. female who presents to Urgent Care today for left-sided chest pain. Patient fell 2 weeks ago onto her left side. She's had left-sided chest pain since. The pain is worse with deep inspiration and cough. She notes wheezing and mild shortness of breath. No vomiting or diarrhea. She is a heavy long-term smoker. She's tried Tylenol flexor which help a bit. Pain is nonexertional.   Past Medical History  Diagnosis Date  . Carpal tunnel syndrome on both sides     right hand surgery 1998, left hand surgery 1999.  . Allergic rhinitis   . Asthma   . Depression   . GERD (gastroesophageal reflux disease)   . Hyperlipidemia   . Hypothyroidism   . Psoriasis   . CAD (coronary artery disease)     cath 12/13/10: mRCA 99%, EF 65-70%;  s/p BMS to mRCA  . HTN (hypertension)   . DM2 (diabetes mellitus, type 2)   . Hyperlipidemia   . MI (myocardial infarction)    Past Surgical History  Procedure Laterality Date  . Coronary angioplasty with stent placement    . Carpal tunnel release      w/ bone spurs  . Dilation and curettage of uterus     History  Substance Use Topics  . Smoking status: Former Smoker -- 0.50 packs/day for 35 years    Types: Cigarettes    Quit date: 05/19/2014  . Smokeless tobacco: Current User     Comment: Quit x 1 week.  Does 4 puffs and then again then done.  Has an E-cigerrette  . Alcohol Use: No   ROS as above Medications: No current facility-administered medications for this encounter.   Current Outpatient Prescriptions  Medication Sig Dispense Refill  . acetaminophen (TYLENOL) 325 MG tablet Take 650 mg by mouth every 4 (four) hours as needed for mild pain.    Marland Kitchen albuterol (PROVENTIL HFA;VENTOLIN HFA) 108 (90 BASE) MCG/ACT inhaler Inhale 1-2 puffs into the lungs every 6 (six) hours as needed for wheezing or shortness of breath. 1 Inhaler 0  . aspirin 81 MG chewable tablet Chew 81 mg by mouth daily.    Marland Kitchen atorvastatin (LIPITOR) 80 MG tablet Take 80  mg by mouth daily.     . Blood Glucose Monitoring Suppl DEVI 1 Device by Does not apply route daily before breakfast. 1 each 0  . fluconazole (DIFLUCAN) 150 MG tablet Take 1 tablet (150 mg total) by mouth daily. 1 tablet 0  . Fluticasone-Salmeterol (ADVAIR) 100-50 MCG/DOSE AEPB Inhale 2 puffs into the lungs 2 (two) times daily.    Marland Kitchen glipiZIDE (GLUCOTROL) 5 MG tablet TAKE 1 TABLET BY MOUTH DAILY BEFORE BREAKFAST 30 tablet 12  . Glucose Blood (BLOOD GLUCOSE TEST STRIPS) STRP 1 strip by In Vitro route daily before breakfast. 100 each 0  . HYDROcodone-acetaminophen (NORCO/VICODIN) 5-325 MG per tablet Take 1 tablet by mouth every 6 (six) hours as needed. 10 tablet 0  . Lancets MISC 1 Units by Does not apply route daily. 50 each 0  . levothyroxine (SYNTHROID, LEVOTHROID) 150 MCG tablet TAKE 1 TABLET BY MOUTH DAILY BEFORE BREAKFAST. 30 tablet 11  . levothyroxine (SYNTHROID, LEVOTHROID) 25 MCG tablet Take 25 mcg by mouth daily before breakfast.    . lisinopril (PRINIVIL,ZESTRIL) 10 MG tablet Take 10 mg by mouth daily.    . meclizine (ANTIVERT) 12.5 MG tablet Take 1 tablet (12.5 mg total) by mouth 3 (three) times daily as needed for dizziness. 30 tablet 0  .  metFORMIN (GLUCOPHAGE) 1000 MG tablet TAKE 1 TABLET BY MOUTH TWICE DAILY 60 tablet 1  . metoprolol succinate (TOPROL-XL) 50 MG 24 hr tablet TAKE 1 TABLET BY MOUTH DAILY. 30 tablet 4  . nitroGLYCERIN (NITROSTAT) 0.4 MG SL tablet Place 1 tablet (0.4 mg total) under the tongue every 5 (five) minutes as needed for chest pain. 25 tablet 3  . Omega-3 Fatty Acids (FISH OIL) 1000 MG CAPS Take 1 capsule by mouth daily.    Marland Kitchen omeprazole (PRILOSEC) 40 MG capsule Take 40 mg by mouth daily.    . ondansetron (ZOFRAN) 4 MG tablet Take 1 tablet (4 mg total) by mouth every 8 (eight) hours as needed for nausea or vomiting. 20 tablet 0  . rOPINIRole (REQUIP) 0.25 MG tablet 0.25 mg 1-3 hours before sleep for 2 days, then increase up to 0.57m for 7 days, then can start  taking 137m 30 tablet 0  . sertraline (ZOLOFT) 50 MG tablet TAKE 3 TABLETS (150 MG TOTAL) BY MOUTH DAILY. 30 tablet 2  . triamcinolone (NASACORT AQ) 55 MCG/ACT AERO nasal inhaler Place 2 sprays into the nose daily.     Allergies  Allergen Reactions  . Gabapentin     Skin rash, lip swelling  . Nsaids     Rectal bleeds  . Tramadol Hcl Nausea Only    agitation     Exam:  BP 142/78 mmHg  Pulse 87  Temp(Src) 98.6 F (37 C) (Oral)  Resp 20  SpO2 95% Gen: Well NAD HEENT: EOMI,  MMM Lungs: Normal work of breathing. Mild Wheezing present bilaterally Heart: RRR no MRG Test wall: Tender left chest wall Abd: NABS, Soft. Nondistended, Nontender Exts: Brisk capillary refill, warm and well perfused.   Patient was given a 2.5/0.5 mg DuoNeb nebulizer treatment, and felt no improvement with wheezing.  No results found for this or any previous visit (from the past 24 hour(s)). Dg Ribs Unilateral W/chest Left  07/30/2014   CLINICAL DATA:  Left rib pain after injury 1 month ago.  EXAM: LEFT RIBS AND CHEST - 3+ VIEW  COMPARISON:  05/19/2014  FINDINGS: Thoracic spondylosis. Minimal atherosclerotic calcification in the aortic arch.  Mild scarring peripherally at the left lung base.  No pneumothorax or pleural effusion. Mild expansion of the left tenth rib near the costochondral junction, likely from an old fracture.  There is deformity of the left fourth rib anteriorly, probably from an old fracture.  IMPRESSION: 1. Suspected old fractures of the left fourth and eleventh ribs. I do not see a definite acute rib fracture. Please note that nondisplaced rib fractures can be occult on conventional radiography. 2. Thoracic spondylosis.   Electronically Signed   By: WaVan Clines.D.   On: 07/30/2014 20:24    Assessment and Plan: [age].o. female with rib contusion. Treat with albuterol at home as well as hydrocodone for pain control. Follow-up with primary care doctor.  Discussed warning signs or  symptoms. Please see discharge instructions. Patient expresses understanding.     EvGregor HamsMD 07/30/14 2031

## 2014-08-02 ENCOUNTER — Telehealth: Payer: Self-pay | Admitting: *Deleted

## 2014-08-02 NOTE — Telephone Encounter (Signed)
Pt called stating she was seen at [facility] on 5/22 for pain in left rib area. ( rib contusion) She was unable to take a deep breath having severe pain with cough. She was given vicodin # 10 that helps with pain.   She is not feeling better, area is still painful and she feels something moving in that area. ( she is wearing a binder)  Will see tomorrow in clinic for ED f/u

## 2014-08-02 NOTE — Telephone Encounter (Signed)
Agree with appt Thanks 

## 2014-08-03 ENCOUNTER — Encounter: Payer: Self-pay | Admitting: Internal Medicine

## 2014-08-03 ENCOUNTER — Ambulatory Visit (INDEPENDENT_AMBULATORY_CARE_PROVIDER_SITE_OTHER): Payer: Self-pay | Admitting: Internal Medicine

## 2014-08-03 VITALS — BP 123/42 | HR 87 | Temp 98.1°F | Ht 63.0 in | Wt 254.2 lb

## 2014-08-03 DIAGNOSIS — E119 Type 2 diabetes mellitus without complications: Secondary | ICD-10-CM

## 2014-08-03 DIAGNOSIS — M940 Chondrocostal junction syndrome [Tietze]: Secondary | ICD-10-CM | POA: Insufficient documentation

## 2014-08-03 DIAGNOSIS — E118 Type 2 diabetes mellitus with unspecified complications: Secondary | ICD-10-CM

## 2014-08-03 LAB — GLUCOSE, CAPILLARY: Glucose-Capillary: 183 mg/dL — ABNORMAL HIGH (ref 65–99)

## 2014-08-03 MED ORDER — KETOROLAC TROMETHAMINE 30 MG/ML IJ SOLN
30.0000 mg | Freq: Once | INTRAMUSCULAR | Status: AC
  Administered 2014-08-03: 30 mg via INTRAMUSCULAR

## 2014-08-03 NOTE — Assessment & Plan Note (Addendum)
Patient's left-sided side pain likely costochondritis as only exacerbated by coughing. She is able to take deep breaths without any pain or irritation therefore less likely any type of pleuritis. Chest x-ray was reviewed with Dr. Eppie Gibson from 07/30/14 that showed possibly history of previous fractures but nothing acute although this is hard to say for certain. Pt has no signs or symptoms to suggest a bronchitis or pneumonia. May consider DEXA scan in the future as the patient is a smoker and postmenopausal therefore at increased risk for osteoporosis. -IM toradol 33m given in clinic (pt states at some point had some hemorrhoids at the time she took some NSAIDs previously although she is taking naproxen now without any problems therefore is not a true allergy) -Continue symptomatically management with NSAIDs and heat along with rest -Follow-up if fails to improve

## 2014-08-03 NOTE — Assessment & Plan Note (Signed)
Was addressing patient's acute issues. Follow-up in 1-3 months to establish care with new PCP.

## 2014-08-03 NOTE — Patient Instructions (Signed)
General Instructions:   Please try to bring all your medicines next time. This will help [place] keep you safe from mistakes.   Take care and it has been a pleasure to be your doctor.    Progress Toward Treatment Goals:  Treatment Goal 07/05/2014  Hemoglobin A1C unchanged  Blood pressure at goal    Self Care Goals & Plans:  Self Care Goal 08/03/2014  Manage my medications take my medicines as prescribed; bring my medications to every visit; refill my medications on time  Monitor my health -  Eat healthy foods drink diet soda or water instead of juice or soda; eat more vegetables; eat foods that are low in salt; eat baked foods instead of fried foods; eat fruit for snacks and desserts  Be physically active -  Meeting treatment goals -    No flowsheet data found.   Care Management & Community Referrals:  Referral 07/05/2014  Referrals made for care management support -  Referrals made to community resources none

## 2014-08-03 NOTE — Progress Notes (Signed)
Subjective:   Patient ID: TANYIKA BARROS female   DOB: 1960-04-22 54 y.o.   MRN: [id number]  HPI: Ms.Orabelle D Vandermeer is a 54 y.o. woman pmh as listed below presents for ED follow up.   Pt was seen on 07/30/14 for a fall and given pain medication for presumed rib contusion. Pt states that since that time she has been able to take a deep breath and only has some burning sensation when she coughs. She describes her fall as tripping over a stone and holding her arms by her ribs. She is not had any repeat falls since that time. She denied any ongoing chest pain, shortness of breath, dyspnea on exertion, fevers or chills. She states that the pain medication helps take the edge off. She has ran out and not tried anything else over-the-counter.  Past Medical History  Diagnosis Date  . Carpal tunnel syndrome on both sides     right hand surgery 1998, left hand surgery 1999.  . Allergic rhinitis   . Asthma   . Depression   . GERD (gastroesophageal reflux disease)   . Hyperlipidemia   . Hypothyroidism   . Psoriasis   . CAD (coronary artery disease)     cath 12/13/10: mRCA 99%, EF 65-70%;  s/p BMS to mRCA  . HTN (hypertension)   . DM2 (diabetes mellitus, type 2)   . Hyperlipidemia   . MI (myocardial infarction)    Current Outpatient Prescriptions  Medication Sig Dispense Refill  . acetaminophen (TYLENOL) 325 MG tablet Take 650 mg by mouth every 4 (four) hours as needed for mild pain.    Marland Kitchen albuterol (PROVENTIL HFA;VENTOLIN HFA) 108 (90 BASE) MCG/ACT inhaler Inhale 1-2 puffs into the lungs every 6 (six) hours as needed for wheezing or shortness of breath. 1 Inhaler 0  . aspirin 81 MG chewable tablet Chew 81 mg by mouth daily.    Marland Kitchen atorvastatin (LIPITOR) 80 MG tablet Take 80 mg by mouth daily.     . Blood Glucose Monitoring Suppl DEVI 1 Device by Does not apply route daily before breakfast. 1 each 0  . fluconazole (DIFLUCAN) 150 MG tablet Take 1 tablet (150 mg total) by mouth daily. 1 tablet 0  .  Fluticasone-Salmeterol (ADVAIR) 100-50 MCG/DOSE AEPB Inhale 2 puffs into the lungs 2 (two) times daily.    Marland Kitchen glipiZIDE (GLUCOTROL) 5 MG tablet TAKE 1 TABLET BY MOUTH DAILY BEFORE BREAKFAST 30 tablet 12  . Glucose Blood (BLOOD GLUCOSE TEST STRIPS) STRP 1 strip by In Vitro route daily before breakfast. 100 each 0  . HYDROcodone-acetaminophen (NORCO/VICODIN) 5-325 MG per tablet Take 1 tablet by mouth every 6 (six) hours as needed. 10 tablet 0  . Lancets MISC 1 Units by Does not apply route daily. 50 each 0  . levothyroxine (SYNTHROID, LEVOTHROID) 150 MCG tablet TAKE 1 TABLET BY MOUTH DAILY BEFORE BREAKFAST. 30 tablet 11  . levothyroxine (SYNTHROID, LEVOTHROID) 25 MCG tablet Take 25 mcg by mouth daily before breakfast.    . lisinopril (PRINIVIL,ZESTRIL) 10 MG tablet Take 10 mg by mouth daily.    . meclizine (ANTIVERT) 12.5 MG tablet Take 1 tablet (12.5 mg total) by mouth 3 (three) times daily as needed for dizziness. 30 tablet 0  . metFORMIN (GLUCOPHAGE) 1000 MG tablet TAKE 1 TABLET BY MOUTH TWICE DAILY 60 tablet 1  . metoprolol succinate (TOPROL-XL) 50 MG 24 hr tablet TAKE 1 TABLET BY MOUTH DAILY. 30 tablet 4  . nitroGLYCERIN (NITROSTAT) 0.4 MG SL tablet Place  1 tablet (0.4 mg total) under the tongue every 5 (five) minutes as needed for chest pain. 25 tablet 3  . Omega-3 Fatty Acids (FISH OIL) 1000 MG CAPS Take 1 capsule by mouth daily.    Marland Kitchen omeprazole (PRILOSEC) 40 MG capsule Take 40 mg by mouth daily.    . ondansetron (ZOFRAN) 4 MG tablet Take 1 tablet (4 mg total) by mouth every 8 (eight) hours as needed for nausea or vomiting. 20 tablet 0  . rOPINIRole (REQUIP) 0.25 MG tablet 0.25 mg 1-3 hours before sleep for 2 days, then increase up to 0.78m for 7 days, then can start taking 148m 30 tablet 0  . sertraline (ZOLOFT) 50 MG tablet TAKE 3 TABLETS (150 MG TOTAL) BY MOUTH DAILY. 30 tablet 2  . triamcinolone (NASACORT AQ) 55 MCG/ACT AERO nasal inhaler Place 2 sprays into the nose daily.     Current  Facility-Administered Medications  Medication Dose Route Frequency Provider Last Rate Last Dose  . ketorolac (TORADOL) 30 MG/ML injection 30 mg  30 mg Intramuscular Once NoJerrye NobleMD       Family History  Problem Relation Age of Onset  . Diabetes Mother   . Hypertension Mother   . Diabetes Father   . Hypertension Father    History   Social History  . Marital Status: Married    Spouse Name: N/A  . Number of Children: 0  . Years of Education: 10th grade   Occupational History  . laConservation officer, [employer]   self-employed  . 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 Smoking status: Former Smoker -- 0.50 packs/day for 35 years    Types: Cigarettes    Quit date: 05/19/2014  . Smokeless tobacco: Current User     Comment: Quit x 1 week.  Does 4 puffs and then again then done.  Has an E-cigerrette  . Alcohol Use: No  . Drug Use: No  . Sexual Activity: Not on file   Other Topics Concern  . 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 Objective:  Physical Exam: Filed Vitals:   08/03/14 1100  BP: 123/42  Pulse: 87  Temp: 98.1 F (36.7 C)  TempSrc: Oral  Height: 5' 3"  (1.6 m)  Weight: 254 lb 3.2 oz (115.304 kg)  SpO2: 99%   General: sitting in chair, NAD Cardiac: RRR, no rubs, murmurs or gallops Pulm: clear to auscultation bilaterally, no crackles, wheezes or rhonchi, moving normal volumes of air Abd: soft, nontender, nondistended, BS present Ext: warm and well perfused, no pedal edema MSK: ttp over left 4/5th rib, no pain on deep inspiration  Assessment & Plan:  Please see problem  oriented charting  Pt discussed with Dr. KlEppie Gibson

## 2014-08-03 NOTE — Progress Notes (Signed)
Case discussed with Dr. Algis Liming at time of visit. We reviewed the resident's history and exam and pertinent patient test results. I agree with the assessment, diagnosis, and plan of care documented in the resident's note.  Review of rib films suggestive of a previous left anterior 4th rib fracture secondary to presence of a callous radiographically behind the posterior 6th left rib (hence making it somewhat difficult to see).  Agree with symptomatic therapy with NSAIDs.

## 2014-08-17 ENCOUNTER — Telehealth: Payer: Self-pay | Admitting: *Deleted

## 2014-08-17 NOTE — Telephone Encounter (Signed)
Pt calls and states she needs a refill of the last time released cough medicine she got, she states she knows what the policy is but she knows what is wrong with her and she needs a refill of this medicine "just talk to my doctor and she will refill it" Please advise

## 2014-08-18 NOTE — Telephone Encounter (Signed)
The last encounter where a cough was addressed was back in March 2016 and codeine cough syrup was given for acute URI that had a cough. Therefore it is not appropriate to just be refilling this prescription at this time. There are many etiologies to cough and especially if the patient is having URI symptoms or an asthma exacerbation that could be causing the cough then should come for evaluation. Thanks.

## 2014-08-18 NOTE — Telephone Encounter (Signed)
Called pt, made appt

## 2014-08-21 ENCOUNTER — Ambulatory Visit (INDEPENDENT_AMBULATORY_CARE_PROVIDER_SITE_OTHER): Payer: Self-pay | Admitting: Pulmonary Disease

## 2014-08-21 ENCOUNTER — Encounter: Payer: Self-pay | Admitting: Pulmonary Disease

## 2014-08-21 VITALS — BP 139/83 | HR 95 | Temp 98.0°F | Ht 63.0 in | Wt 254.8 lb

## 2014-08-21 DIAGNOSIS — Z79899 Other long term (current) drug therapy: Secondary | ICD-10-CM

## 2014-08-21 DIAGNOSIS — R05 Cough: Secondary | ICD-10-CM

## 2014-08-21 DIAGNOSIS — E119 Type 2 diabetes mellitus without complications: Secondary | ICD-10-CM

## 2014-08-21 DIAGNOSIS — F329 Major depressive disorder, single episode, unspecified: Secondary | ICD-10-CM

## 2014-08-21 DIAGNOSIS — J069 Acute upper respiratory infection, unspecified: Secondary | ICD-10-CM

## 2014-08-21 DIAGNOSIS — F32A Depression, unspecified: Secondary | ICD-10-CM

## 2014-08-21 DIAGNOSIS — M940 Chondrocostal junction syndrome [Tietze]: Secondary | ICD-10-CM

## 2014-08-21 DIAGNOSIS — G2581 Restless legs syndrome: Secondary | ICD-10-CM

## 2014-08-21 DIAGNOSIS — E118 Type 2 diabetes mellitus with unspecified complications: Secondary | ICD-10-CM

## 2014-08-21 LAB — POCT GLYCOSYLATED HEMOGLOBIN (HGB A1C): Hemoglobin A1C: 7.9

## 2014-08-21 LAB — GLUCOSE, CAPILLARY: Glucose-Capillary: 149 mg/dL — ABNORMAL HIGH (ref 65–99)

## 2014-08-21 MED ORDER — SERTRALINE HCL 50 MG PO TABS: ORAL_TABLET | ORAL | Status: DC

## 2014-08-21 MED ORDER — METFORMIN HCL 1000 MG PO TABS: 1000.0000 mg | ORAL_TABLET | Freq: Two times a day (BID) | ORAL | Status: DC

## 2014-08-21 MED ORDER — ROPINIROLE HCL 1 MG PO TABS: 1.0000 mg | ORAL_TABLET | Freq: Every day | ORAL | Status: DC

## 2014-08-21 MED ORDER — HYDROCOD POLST-CPM POLST ER 10-8 MG/5ML PO SUER: 5.0000 mL | Freq: Two times a day (BID) | ORAL | Status: DC | PRN

## 2014-08-21 NOTE — Progress Notes (Signed)
Subjective:   Patient ID: Kendra Adams, female    DOB: [birth year], 54 y.o.   MRN: [id number]  HPI Kendra Adams is a 54 year old woman with history of allergic rhinitis, asthma, GERD, HLD, hypothyroidism, CAD, HTN, DM2 presenting for evaluation.  She was last seen in clinic 08/03/2014.  Cough: Has been going on since last visit. It is productive of charcoal and sometimes green sputum. She reports post nasal drip. Denies fevers, chills, sinus pain. She has been trying to quit smoking and is down to less than 10 cigarettes a day. 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 Minimal relief with albuterol.  Her costochondritis has resolved.  Review of Systems Constitutional: no fevers/chills Eyes: no vision changes Ears, nose, mouth, throat, and face: + cough Respiratory: + shortness of breath Cardiovascular: no chest pain Gastrointestinal: no nausea/vomiting, no abdominal pain, no constipation, no diarrhea Genitourinary: no dysuria, no hematuria Integument: no rash Hematologic/lymphatic: no bleeding/bruising, no edema Musculoskeletal: no arthralgias, no myalgias Neurological: no paresthesias, no weakness  Past Medical History  Diagnosis Date  . Carpal tunnel syndrome on both sides     right hand surgery 1998, left hand surgery 1999.  . Allergic rhinitis   . Asthma   . Depression   . GERD (gastroesophageal reflux disease)   . Hyperlipidemia   . Hypothyroidism   . Psoriasis   . CAD (coronary artery disease)     cath 12/13/10: mRCA 99%, EF 65-70%;  s/p BMS to mRCA  . HTN (hypertension)   . DM2 (diabetes mellitus, type 2)   . Hyperlipidemia   . MI (myocardial infarction)     Current Outpatient Prescriptions on File Prior to Visit  Medication Sig Dispense Refill  . acetaminophen (TYLENOL) 325 MG tablet Take 650 mg by mouth every 4 (four) hours as needed for mild pain.    Marland Kitchen albuterol (PROVENTIL  HFA;VENTOLIN HFA) 108 (90 BASE) MCG/ACT inhaler Inhale 1-2 puffs into the lungs every 6 (six) hours as needed for wheezing or shortness of breath. 1 Inhaler 0  . aspirin 81 MG chewable tablet Chew 81 mg by mouth daily.    Marland Kitchen atorvastatin (LIPITOR) 80 MG tablet Take 80 mg by mouth daily.     . Blood Glucose Monitoring Suppl DEVI 1 Device by Does not apply route daily before breakfast. 1 each 0  . fluconazole (DIFLUCAN) 150 MG tablet Take 1 tablet (150 mg total) by mouth daily. 1 tablet 0  . Fluticasone-Salmeterol (ADVAIR) 100-50 MCG/DOSE AEPB Inhale 2 puffs into the lungs 2 (two) times daily.    Marland Kitchen glipiZIDE (GLUCOTROL) 5 MG tablet TAKE 1 TABLET BY MOUTH DAILY BEFORE BREAKFAST 30 tablet 12  . Glucose Blood (BLOOD GLUCOSE TEST STRIPS) STRP 1 strip by In Vitro route daily before breakfast. 100 each 0  . Lancets MISC 1 Units by Does not apply route daily. 50 each 0  . levothyroxine (SYNTHROID, LEVOTHROID) 150 MCG tablet TAKE 1 TABLET BY MOUTH DAILY BEFORE BREAKFAST. 30 tablet 11  . levothyroxine (SYNTHROID, LEVOTHROID) 25 MCG tablet Take 25 mcg by mouth daily before breakfast.    . lisinopril (PRINIVIL,ZESTRIL) 10 MG tablet Take 10 mg by mouth daily.    . meclizine (ANTIVERT) 12.5 MG tablet Take 1 tablet (12.5 mg total) by mouth 3 (three) times daily as needed for dizziness. 30 tablet 0  . metFORMIN (GLUCOPHAGE) 1000 MG tablet TAKE 1 TABLET BY MOUTH  TWICE DAILY 60 tablet 1  . metoprolol succinate (TOPROL-XL) 50 MG 24 hr tablet TAKE 1 TABLET BY MOUTH DAILY. 30 tablet 4  . nitroGLYCERIN (NITROSTAT) 0.4 MG SL tablet Place 1 tablet (0.4 mg total) under the tongue every 5 (five) minutes as needed for chest pain. 25 tablet 3  . Omega-3 Fatty Acids (FISH OIL) 1000 MG CAPS Take 1 capsule by mouth daily.    Marland Kitchen omeprazole (PRILOSEC) 40 MG capsule Take 40 mg by mouth daily.    . ondansetron (ZOFRAN) 4 MG tablet Take 1 tablet (4 mg total) by mouth every 8 (eight) hours as needed for nausea or vomiting. 20 tablet 0    . rOPINIRole (REQUIP) 0.25 MG tablet 0.25 mg 1-3 hours before sleep for 2 days, then increase up to 0.45m for 7 days, then can start taking 173m 30 tablet 0  . sertraline (ZOLOFT) 50 MG tablet TAKE 3 TABLETS (150 MG TOTAL) BY MOUTH DAILY. 30 tablet 2  . triamcinolone (NASACORT AQ) 55 MCG/ACT AERO nasal inhaler Place 2 sprays into the nose daily.     No current facility-administered medications on file prior to visit.    Today's Vitals   08/21/14 1320  BP: 139/83  Pulse: 95  Temp: 98 F (36.7 C)  TempSrc: Oral  Height: 5' 3"  (1.6 m)  Weight: 254 lb 12.8 oz (115.577 kg)  SpO2: 95%  PainSc: 0-No pain     Objective:  Physical Exam  Constitutional: She is oriented to person, place, and time. No distress.  HENT:  Head: Normocephalic and atraumatic.  Mouth/Throat: Oropharynx is clear and moist.  Eyes: Conjunctivae are normal.  Neck: Neck supple.  Cardiovascular: Normal rate and regular rhythm.   Pulmonary/Chest: Effort normal. She has no wheezes. She has no rales.  Abdominal: Soft.  Musculoskeletal: Normal range of motion. She exhibits no tenderness.  Neurological: She is alert and oriented to person, place, and time.  Skin: Skin is warm and dry.  Psychiatric: She has a normal mood and affect.    Assessment & Plan:  Please refer to problem based charting.

## 2014-08-21 NOTE — Patient Instructions (Signed)
General Instructions:   Please bring your medicines with you each time you come to clinic.  Medicines may include prescription medications, over-the-counter medications, herbal remedies, eye drops, vitamins, or other pills.   Progress Toward Treatment Goals:  Treatment Goal 08/21/2014  Hemoglobin A1C unable to assess  Blood pressure at goal  Stop smoking smoking less    Self Care Goals & Plans:  Self Care Goal 08/21/2014  Manage my medications take my medicines as prescribed; bring my medications to every visit; refill my medications on time  Monitor my health bring my glucose meter and log to each visit; keep track of my blood glucose; check my feet daily  Eat healthy foods drink diet soda or water instead of juice or soda; eat more vegetables; eat foods that are low in salt; eat baked foods instead of fried foods  Be physically active take a walk every day  Stop smoking call [facility] (1-800-QUIT-NOW)  Meeting treatment goals -

## 2014-08-22 ENCOUNTER — Other Ambulatory Visit: Payer: Self-pay | Admitting: Internal Medicine

## 2014-08-22 DIAGNOSIS — R05 Cough: Secondary | ICD-10-CM | POA: Insufficient documentation

## 2014-08-22 DIAGNOSIS — R053 Chronic cough: Secondary | ICD-10-CM | POA: Insufficient documentation

## 2014-08-22 NOTE — Assessment & Plan Note (Addendum)
Lab Results  Component Value Date   HGBA1C 7.9 08/21/2014   HGBA1C 7.9 05/19/2014   HGBA1C 7.9 02/10/2014     Assessment: Diabetes control: fair control Progress toward A1C goal:  unchanged  Plan: Medications:  Continue metformin 1087m BID, glipizide 539mdaily Other plans:  -Follow up next month with new PCP. May consider increasing glipizide to 49m16mID.

## 2014-08-22 NOTE — Assessment & Plan Note (Signed)
Refilled zoloft 182m daily

## 2014-08-22 NOTE — Assessment & Plan Note (Signed)
Reports resolution.

## 2014-08-22 NOTE — Assessment & Plan Note (Signed)
Cough likely resolving upper respiratory infection.   Plan: -Symptomatic treatment with tussionex 26ms Q12hr prn

## 2014-08-22 NOTE — Assessment & Plan Note (Signed)
Refilled Requip 65m QHS

## 2014-08-23 NOTE — Progress Notes (Signed)
Internal Medicine Clinic Attending  Case discussed with Dr. Krall at the time of the visit.  We reviewed the resident's history and exam and pertinent patient test results.  I agree with the assessment, diagnosis, and plan of care documented in the resident's note.  

## 2014-09-04 ENCOUNTER — Other Ambulatory Visit: Payer: Self-pay

## 2014-09-05 ENCOUNTER — Telehealth: Payer: Self-pay | Admitting: Internal Medicine

## 2014-09-05 NOTE — Telephone Encounter (Signed)
I think that she needs to be seen in the clinic or urgent care. I don't want to prescribe something for her without her being seen.

## 2014-09-05 NOTE — Telephone Encounter (Signed)
Pt scheduled for Thursday at 9:45

## 2014-09-05 NOTE — Telephone Encounter (Signed)
Patient wants to talk to nurse about chest congestion. Refused to come in for appointment.

## 2014-09-05 NOTE — Telephone Encounter (Signed)
Returned call to pt  She is c/o continuous cough.  She was seen in clinic on 6/3 for URI with cough.  She states the cough still remains.  It is productive with a dark Page and green tint.  Onset about a month ago. She was treated with tussionex 38ms Q12hr which helped the cough. Cough is all day and worse at night.  She also has post nasal drip and she uses Chlor trimeton.   No fever but does get some SOB with walking  She does not want to come into clinic.

## 2014-09-07 ENCOUNTER — Encounter: Payer: Self-pay | Admitting: Internal Medicine

## 2014-09-07 ENCOUNTER — Ambulatory Visit (INDEPENDENT_AMBULATORY_CARE_PROVIDER_SITE_OTHER): Payer: Self-pay | Admitting: Internal Medicine

## 2014-09-07 VITALS — BP 120/88 | HR 84 | Temp 98.1°F | Wt 256.1 lb

## 2014-09-07 DIAGNOSIS — E1122 Type 2 diabetes mellitus with diabetic chronic kidney disease: Secondary | ICD-10-CM

## 2014-09-07 DIAGNOSIS — R053 Chronic cough: Secondary | ICD-10-CM

## 2014-09-07 DIAGNOSIS — E118 Type 2 diabetes mellitus with unspecified complications: Secondary | ICD-10-CM

## 2014-09-07 DIAGNOSIS — I10 Essential (primary) hypertension: Secondary | ICD-10-CM

## 2014-09-07 DIAGNOSIS — N189 Chronic kidney disease, unspecified: Secondary | ICD-10-CM

## 2014-09-07 DIAGNOSIS — K219 Gastro-esophageal reflux disease without esophagitis: Secondary | ICD-10-CM

## 2014-09-07 DIAGNOSIS — F17200 Nicotine dependence, unspecified, uncomplicated: Secondary | ICD-10-CM

## 2014-09-07 DIAGNOSIS — J45909 Unspecified asthma, uncomplicated: Secondary | ICD-10-CM

## 2014-09-07 DIAGNOSIS — Z7951 Long term (current) use of inhaled steroids: Secondary | ICD-10-CM

## 2014-09-07 DIAGNOSIS — F17210 Nicotine dependence, cigarettes, uncomplicated: Secondary | ICD-10-CM

## 2014-09-07 DIAGNOSIS — R05 Cough: Secondary | ICD-10-CM

## 2014-09-07 LAB — GLUCOSE, CAPILLARY: Glucose-Capillary: 217 mg/dL — ABNORMAL HIGH (ref 65–99)

## 2014-09-07 MED ORDER — LORATADINE 10 MG PO TABS: 10.0000 mg | ORAL_TABLET | Freq: Every day | ORAL | Status: DC

## 2014-09-07 NOTE — Patient Instructions (Signed)
General Instructions:  I want you to stop taking lisinopril for the next two weeks to see if that improves your cough.  Start taking loratadine 60m each night for allergies.  Work on quitting smoking.   Please bring your medicines with you each time you come to clinic.  Medicines may include prescription medications, over-the-counter medications, herbal remedies, eye drops, vitamins, or other pills.   Progress Toward Treatment Goals:  Treatment Goal 09/07/2014  Hemoglobin A1C unchanged  Blood pressure at goal  Stop smoking smoking less    Self Care Goals & Plans:  Self Care Goal 09/07/2014  Manage my medications bring my medications to every visit; refill my medications on time; take my medicines as prescribed  Monitor my health keep track of my blood glucose; bring my glucose meter and log to each visit; keep track of my blood pressure  Eat healthy foods eat more vegetables; eat foods that are low in salt; eat baked foods instead of fried foods  Be physically active find an activity I enjoy; take a walk every day  Stop smoking cut down the number of cigarettes smoked  Meeting treatment goals -    No flowsheet data found.   Care Management & Community Referrals:  Referral 07/05/2014  Referrals made for care management support -  Referrals made to community resources none

## 2014-09-07 NOTE — Assessment & Plan Note (Signed)
-   Her Blood pressure is very well controlled today.  I will have her stop Lisinopril for the next 2 weeks to see if it resolves her cough, if not she should be restarted as she also has CKD and DM.  If it does resolve will consider change to ARB

## 2014-09-07 NOTE — Progress Notes (Signed)
[facility] Subjective:   Patient ID: Kendra Adams female   DOB: Oct 21, 1960 54 y.o.   MRN: [id number]  HPI: [name] is a 54 y.o. female with a PMH detailed below who presents for today for chronic cough.  She report the cough started 2-3 months ago.  She has tried tussenex with some relief as well as her albuterol inhaler.  Overall she feels more congested and feels like she has a lot of post nasal drip and needs to cough it up but does not actually cough much up.  She denies any fever or chills.  She denies any heartburn.  She reports her asthma is well controlled but sometimes gets winded when she walks >~100 ft.    Past Medical History  Diagnosis Date  . Carpal tunnel syndrome on both sides     right hand surgery 1998, left hand surgery 1999.  . Allergic rhinitis   . Asthma   . Depression   . GERD (gastroesophageal reflux disease)   . Hyperlipidemia   . Hypothyroidism   . Psoriasis   . CAD (coronary artery disease)     cath 12/13/10: mRCA 99%, EF 65-70%;  s/p BMS to mRCA  . HTN (hypertension)   . DM2 (diabetes mellitus, type 2)   . Hyperlipidemia   . MI (myocardial infarction)    Current Outpatient Prescriptions  Medication Sig Dispense Refill  . acetaminophen (TYLENOL) 325 MG tablet Take 650 mg by mouth every 4 (four) hours as needed for mild pain.    Marland Kitchen albuterol (PROVENTIL HFA;VENTOLIN HFA) 108 (90 BASE) MCG/ACT inhaler Inhale 1-2 puffs into the lungs every 6 (six) hours as needed for wheezing or shortness of breath. 1 Inhaler 0  . aspirin 81 MG chewable tablet Chew 81 mg by mouth daily.    Marland Kitchen atorvastatin (LIPITOR) 80 MG tablet Take 80 mg by mouth daily.     . Blood Glucose Monitoring Suppl DEVI 1 Device by Does not apply route daily before breakfast. 1 each 0  . chlorpheniramine-HYDROcodone (TUSSIONEX PENNKINETIC ER) 10-8 MG/5ML SUER Take 5 mLs by mouth every 12 (twelve) hours as needed for cough. 115 mL 0  . fluconazole (DIFLUCAN) 150 MG  tablet Take 1 tablet (150 mg total) by mouth daily. 1 tablet 0  . Fluticasone-Salmeterol (ADVAIR) 100-50 MCG/DOSE AEPB Inhale 2 puffs into the lungs 2 (two) times daily.    Marland Kitchen glipiZIDE (GLUCOTROL) 5 MG tablet TAKE 1 TABLET BY MOUTH DAILY BEFORE BREAKFAST 30 tablet 12  . Glucose Blood (BLOOD GLUCOSE TEST STRIPS) STRP 1 strip by In Vitro route daily before breakfast. 100 each 0  . Lancets MISC 1 Units by Does not apply route daily. 50 each 0  . levothyroxine (SYNTHROID, LEVOTHROID) 150 MCG tablet TAKE 1 TABLET BY MOUTH DAILY BEFORE BREAKFAST. 30 tablet 11  . levothyroxine (SYNTHROID, LEVOTHROID) 25 MCG tablet Take 25 mcg by mouth daily before breakfast.    . loratadine (CLARITIN) 10 MG tablet Take 1 tablet (10 mg total) by mouth daily. 30 tablet 11  . meclizine (ANTIVERT) 12.5 MG tablet Take 1 tablet (12.5 mg total) by mouth 3 (three) times daily as needed for dizziness. 30 tablet 0  . metFORMIN (GLUCOPHAGE) 1000 MG tablet Take 1 tablet (1,000 mg total) by mouth 2 (two) times daily. 60 tablet 1  . metFORMIN (GLUCOPHAGE) 1000 MG tablet TAKE 1 TABLET BY MOUTH TWICE DAILY 60 tablet 0  . metoprolol succinate (TOPROL-XL) 50 MG 24 hr tablet TAKE 1  TABLET BY MOUTH DAILY. 30 tablet 4  . nitroGLYCERIN (NITROSTAT) 0.4 MG SL tablet Place 1 tablet (0.4 mg total) under the tongue every 5 (five) minutes as needed for chest pain. 25 tablet 3  . Omega-3 Fatty Acids (FISH OIL) 1000 MG CAPS Take 1 capsule by mouth daily.    Marland Kitchen omeprazole (PRILOSEC) 40 MG capsule TAKE 1 CAPSULE (40 MG TOTAL) BY MOUTH DAILY. 30 capsule 3  . ondansetron (ZOFRAN) 4 MG tablet Take 1 tablet (4 mg total) by mouth every 8 (eight) hours as needed for nausea or vomiting. 20 tablet 0  . rOPINIRole (REQUIP) 1 MG tablet Take 1 tablet (1 mg total) by mouth at bedtime. 30 tablet 1  . sertraline (ZOLOFT) 50 MG tablet TAKE 3 TABLETS (150 MG TOTAL) BY MOUTH DAILY. 90 tablet 2  . sertraline (ZOLOFT) 50 MG tablet TAKE 3 TABLETS (150 MG TOTAL) BY MOUTH  DAILY. 90 tablet 1  . triamcinolone (NASACORT AQ) 55 MCG/ACT AERO nasal inhaler Place 2 sprays into the nose daily.     No current facility-administered medications for this visit.   Family History  Problem Relation Age of Onset  . Diabetes Mother   . Hypertension Mother   . Diabetes Father   . Hypertension Father    History   Social History  . Marital Status: Married    Spouse Name: N/A  . Number of Children: 0  . Years of Education: 10th grade   Occupational History  . Conservation officer, nature     self-employed  . 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 Smoking status: Current Some Day Smoker -- 0.50 packs/day for 35 years    Types: Cigarettes    Last Attempt to Quit: 05/19/2014  . Smokeless tobacco: Current User     Comment: Trying to cut back.  . Alcohol Use: No  . Drug Use: No  . Sexual Activity: Not on file   Other Topics Concern  . Not on file   Social History Narrative   Father died of suicide (gunshot) in 28. 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       Patient lives with her husband, her husband's niece Tammy who is mentally retarded and her best friend and the friend's daughter and 6 dogs               Review of Systems: Review of Systems  Constitutional: Negative for fever, chills and malaise/fatigue.  HENT: Positive for congestion.   Eyes: Negative for blurred vision.  Respiratory: Positive for cough, shortness of breath and wheezing (occasionally). Negative for hemoptysis.   Cardiovascular: Negative for chest pain.  Gastrointestinal: Negative for heartburn, abdominal pain, diarrhea and constipation.  Genitourinary: Negative for dysuria.  Musculoskeletal: Negative for myalgias.  Neurological: Negative for dizziness and headaches.  Endo/Heme/Allergies: Positive for environmental allergies.  Psychiatric/Behavioral: Negative for depression.      Objective:  Physical Exam: Filed Vitals:   09/07/14 1011  BP: 120/88  Pulse: 84  Temp: 98.1 F (36.7 C)  TempSrc: Oral  Weight: 256 lb 1.6 oz (116.166 kg)  SpO2: 99%  Physical Exam  Constitutional: She is well-developed, well-nourished, and in no distress.  HENT:  Mouth/Throat: Oropharynx is clear and moist.  Eyes: Conjunctivae are normal.  Cardiovascular: Normal rate, regular rhythm and normal heart sounds.   Pulmonary/Chest: Effort normal  and breath sounds normal. No respiratory distress. She has no wheezes.  Abdominal: Soft. Bowel sounds are normal. There is no tenderness.  Nursing note and vitals reviewed.   Assessment & Plan:  Case discussed with Dr. Daryll Drown  Chronic cough Her cough has lasted 2-3 months.  He has multiple reasons for a chronic cough.  1st She is a smoker, she has a 40 pack/year history. She is currently smoking 0.5 ppd - I advised her strongly to quit smoking as this is very likely at least contributing to the cough.  2nd: She does take lisinopril which could cause a cough - Will try two weeks off medication, if cough resolves may consider switch to ARB versus retrial  3rd: She reports seasonal allergies and Post nasal drip: she is currently taking Flonase - Will add loratadine.  4th She has a history of GERD: she takes omeprazole daily and denies any heartburn. - Will continue once daily omeprazole for now, if not better in 2 weeks consider a trial of BID for 1-2 weeks.  5th: She has asthma: she has no wheezing on exam - Will continue her advair and albuterol, I do not suspect any this is currently the cause.  Other ideas for the future: She currently has no insurance but given she is a 40 pack/year smoker a low dose screening CT could be considered.  A cheaper option may be to repeat a CXR at the next visit.  Essential hypertension - Her Blood pressure is very well controlled today.  I will have her stop Lisinopril for the next 2 weeks to see if  it resolves her cough, if not she should be restarted as she also has CKD and DM.  If it does resolve will consider change to ARB  Diabetes mellitus type 2 with complications - CBG checked today per protocol and is above goal - Will not make any acute changes to DM management today.   Medications Ordered Meds ordered this encounter  Medications  . loratadine (CLARITIN) 10 MG tablet    Sig: Take 1 tablet (10 mg total) by mouth daily.    Dispense:  30 tablet    Refill:  11   Other Orders Orders Placed This Encounter  Procedures  . Glucose, capillary   Follow Up: Return in about 2 weeks (around 09/21/2014).

## 2014-09-07 NOTE — Assessment & Plan Note (Signed)
-   CBG checked today per protocol and is above goal - Will not make any acute changes to DM management today.

## 2014-09-07 NOTE — Assessment & Plan Note (Signed)
Her cough has lasted 2-3 months.  He has multiple reasons for a chronic cough.  1st She is a smoker, she has a 40 pack/year history. She is currently smoking 0.5 ppd - I advised her strongly to quit smoking as this is very likely at least contributing to the cough.  2nd: She does take lisinopril which could cause a cough - Will try two weeks off medication, if cough resolves may consider switch to ARB versus retrial  3rd: She reports seasonal allergies and Post nasal drip: she is currently taking Flonase - Will add loratadine.  4th She has a history of GERD: she takes omeprazole daily and denies any heartburn. - Will continue once daily omeprazole for now, if not better in 2 weeks consider a trial of BID for 1-2 weeks.  5th: She has asthma: she has no wheezing on exam - Will continue her advair and albuterol, I do not suspect any this is currently the cause.  Other ideas for the future: She currently has no insurance but given she is a 32 pack/year smoker a low dose screening CT could be considered.  A cheaper option may be to repeat a CXR at the next visit.

## 2014-09-08 NOTE — Progress Notes (Signed)
Internal Medicine Clinic Attending  Case discussed with Dr. Hoffman soon after the resident saw the patient.  We reviewed the resident's history and exam and pertinent patient test results.  I agree with the assessment, diagnosis, and plan of care documented in the resident's note. 

## 2014-09-11 ENCOUNTER — Other Ambulatory Visit: Payer: Self-pay | Admitting: Cardiology

## 2014-09-12 ENCOUNTER — Other Ambulatory Visit: Payer: Self-pay

## 2014-09-12 MED ORDER — METOPROLOL SUCCINATE ER 50 MG PO TB24: 50.0000 mg | ORAL_TABLET | Freq: Every day | ORAL | Status: DC

## 2014-09-18 ENCOUNTER — Other Ambulatory Visit: Payer: Self-pay | Admitting: Internal Medicine

## 2014-09-18 ENCOUNTER — Other Ambulatory Visit: Payer: Self-pay | Admitting: Cardiology

## 2014-09-18 ENCOUNTER — Other Ambulatory Visit: Payer: Self-pay | Admitting: Pulmonary Disease

## 2014-09-18 NOTE — Telephone Encounter (Signed)
Needs mid sep appt with Dr Melburn Hake. DM and chronic issue F/U

## 2014-10-03 ENCOUNTER — Emergency Department (INDEPENDENT_AMBULATORY_CARE_PROVIDER_SITE_OTHER): Payer: Self-pay

## 2014-10-03 ENCOUNTER — Emergency Department (INDEPENDENT_AMBULATORY_CARE_PROVIDER_SITE_OTHER)
Admission: EM | Admit: 2014-10-03 | Discharge: 2014-10-03 | Disposition: A | Discharge status: Home / Self Care | Payer: Self-pay | Source: Home / Self Care | Attending: Emergency Medicine | Admitting: Emergency Medicine

## 2014-10-03 ENCOUNTER — Encounter (HOSPITAL_COMMUNITY): Payer: Self-pay | Admitting: Emergency Medicine

## 2014-10-03 DIAGNOSIS — J069 Acute upper respiratory infection, unspecified: Secondary | ICD-10-CM

## 2014-10-03 DIAGNOSIS — J40 Bronchitis, not specified as acute or chronic: Secondary | ICD-10-CM

## 2014-10-03 MED ORDER — PREDNISONE 50 MG PO TABS: ORAL_TABLET | ORAL | Status: DC

## 2014-10-03 MED ORDER — IPRATROPIUM-ALBUTEROL 0.5-2.5 (3) MG/3ML IN SOLN
3.0000 mL | Freq: Once | RESPIRATORY_TRACT | Status: AC
  Administered 2014-10-03: 3 mL via RESPIRATORY_TRACT

## 2014-10-03 MED ORDER — IPRATROPIUM-ALBUTEROL 0.5-2.5 (3) MG/3ML IN SOLN
RESPIRATORY_TRACT | Status: AC
  Filled 2014-10-03: qty 3

## 2014-10-03 MED ORDER — HYDROCOD POLST-CPM POLST ER 10-8 MG/5ML PO SUER: 5.0000 mL | Freq: Two times a day (BID) | ORAL | Status: DC | PRN

## 2014-10-03 MED ORDER — DOXYCYCLINE HYCLATE 100 MG PO CAPS: 100.0000 mg | ORAL_CAPSULE | Freq: Two times a day (BID) | ORAL | Status: DC

## 2014-10-03 MED ORDER — IPRATROPIUM BROMIDE 0.06 % NA SOLN: 2.0000 | Freq: Four times a day (QID) | NASAL | Status: DC

## 2014-10-03 NOTE — ED Notes (Signed)
Pt states she has been dealing with a URI since March that will not go away.  She has been to see a doctor 3-4 times.  She states she has not had an xray or any antibiotics.  She also reports sinus congestion and a tickle in her throat.  Pt has a long history of UR issues.

## 2014-10-03 NOTE — Discharge Instructions (Signed)
You have bronchitis. Take doxycycline and prednisone as prescribed. Use the cough syrup as needed for cough. Use the albuterol every 4 hours as needed for wheezing or cough. Use Atrovent nasal spray 4 times a day to help with the postnasal drip. You should see improvement in the next 3-5 days. If you develop fevers, difficulty breathing, or are just not getting better, please come back or go to the emergency room.

## 2014-10-03 NOTE — ED Provider Notes (Signed)
CSN: [id number]     Arrival date & time 10/03/14  1856 History   First MD Initiated Contact with Patient 10/03/14 1930     Chief Complaint  Patient presents with  . URI   (Consider location/radiation/quality/duration/timing/severity/associated sxs/prior Treatment) HPI She is a 54 year old woman here for evaluation of cough. She states she has had this cough since March. She has seen her doctor several times and been diagnosed with viral upper respiratory infection. She states she has been treated with cough medicine, but no antibiotics. She states the cough is productive. Sputum is dark green and she has started to see some charcoal-looking stuff in it. She also reports some postnasal drip. She will have coughing spells that make it difficult for her to breathe. No fevers. No nausea or vomiting. She is a former smoker. She states she quit smoking a few months ago.  Past Medical History  Diagnosis Date  . Carpal tunnel syndrome on both sides     right hand surgery 1998, left hand surgery 1999.  . Allergic rhinitis   . Asthma   . Depression   . GERD (gastroesophageal reflux disease)   . Hyperlipidemia   . Hypothyroidism   . Psoriasis   . CAD (coronary artery disease)     cath 12/13/10: mRCA 99%, EF 65-70%;  s/p BMS to mRCA  . HTN (hypertension)   . DM2 (diabetes mellitus, type 2)   . Hyperlipidemia   . MI (myocardial infarction)    Past Surgical History  Procedure Laterality Date  . Coronary angioplasty with stent placement    . Carpal tunnel release      w/ bone spurs  . Dilation and curettage of uterus     Family History  Problem Relation Age of Onset  . Diabetes Mother   . Hypertension Mother   . Diabetes Father   . Hypertension Father    History  Substance Use Topics  . Smoking status: Current Some Day Smoker -- 0.50 packs/day for 40 years    Types: Cigarettes    Last Attempt to Quit: 05/19/2014  . Smokeless tobacco: Current User     Comment: Trying to cut back.,    . Alcohol Use: No   OB History    No data available     Review of Systems As in history of present illness Allergies  Gabapentin; Nsaids; and Tramadol hcl  Home Medications   Prior to Admission medications   Medication Sig Start Date End Date Taking? Authorizing Provider  acetaminophen (TYLENOL) 325 MG tablet Take 650 mg by mouth every 4 (four) hours as needed for mild pain.    Historical Provider, MD  albuterol (PROVENTIL HFA;VENTOLIN HFA) 108 (90 BASE) MCG/ACT inhaler Inhale 1-2 puffs into the lungs every 6 (six) hours as needed for wheezing or shortness of breath. 05/20/14   Wandra Arthurs, MD  aspirin 81 MG chewable tablet Chew 81 mg by mouth daily.    Historical Provider, MD  atorvastatin (LIPITOR) 80 MG tablet Take 80 mg by mouth daily.     Historical Provider, MD  Blood Glucose Monitoring Suppl DEVI 1 Device by Does not apply route daily before breakfast. 05/23/14   Ejiroghene Arlyce Dice, MD  chlorpheniramine-HYDROcodone (TUSSIONEX PENNKINETIC ER) 10-8 MG/5ML SUER Take 5 mLs by mouth every 12 (twelve) hours as needed for cough. 10/03/14   Melony Overly, MD  doxycycline (VIBRAMYCIN) 100 MG capsule Take 1 capsule (100 mg total) by mouth 2 (two) times daily. 10/03/14   Junie Panning  Earlene Plater, MD  fluconazole (DIFLUCAN) 150 MG tablet Take 1 tablet (150 mg total) by mouth daily. 07/25/14   Norman Herrlich, MD  Fluticasone-Salmeterol (ADVAIR) 100-50 MCG/DOSE AEPB Inhale 2 puffs into the lungs 2 (two) times daily.    Historical Provider, MD  glipiZIDE (GLUCOTROL) 5 MG tablet TAKE 1 TABLET BY MOUTH DAILY BEFORE BREAKFAST 07/18/14   Jerrye Noble, MD  Glucose Blood (BLOOD GLUCOSE TEST STRIPS) STRP 1 strip by In Vitro route daily before breakfast. 05/23/14   Ejiroghene E Emokpae, MD  ipratropium (ATROVENT) 0.06 % nasal spray Place 2 sprays into both nostrils 4 (four) times daily. 10/03/14   Melony Overly, MD  Lancets MISC 1 Units by Does not apply route daily. 05/23/14   Ejiroghene Arlyce Dice, MD  levothyroxine  (SYNTHROID, LEVOTHROID) 150 MCG tablet TAKE 1 TABLET BY MOUTH DAILY BEFORE BREAKFAST. 05/05/14   Jerrye Noble, MD  levothyroxine (SYNTHROID, LEVOTHROID) 25 MCG tablet Take 25 mcg by mouth daily before breakfast.    Historical Provider, MD  loratadine (CLARITIN) 10 MG tablet Take 1 tablet (10 mg total) by mouth daily. 09/07/14   Lucious Groves, DO  meclizine (ANTIVERT) 12.5 MG tablet Take 1 tablet (12.5 mg total) by mouth 3 (three) times daily as needed for dizziness. 07/25/14   Norman Herrlich, MD  metFORMIN (GLUCOPHAGE) 1000 MG tablet TAKE 1 TABLET (1,000 MG TOTAL) BY MOUTH 2 (TWO) TIMES DAILY. 09/18/14   Bartholomew Crews, MD  metoprolol succinate (TOPROL-XL) 50 MG 24 hr tablet Take 1 tablet (50 mg total) by mouth daily. Take with or immediately following a meal. 09/12/14   Dorothy Spark, MD  metoprolol succinate (TOPROL-XL) 50 MG 24 hr tablet TAKE 1 TABLET BY MOUTH DAILY. 09/18/14   Dorothy Spark, MD  nitroGLYCERIN (NITROSTAT) 0.4 MG SL tablet Place 1 tablet (0.4 mg total) under the tongue every 5 (five) minutes as needed for chest pain. 04/05/14   Dorothy Spark, MD  Omega-3 Fatty Acids (FISH OIL) 1000 MG CAPS Take 1 capsule by mouth daily.    Historical Provider, MD  omeprazole (PRILOSEC) 40 MG capsule TAKE 1 CAPSULE (40 MG TOTAL) BY MOUTH DAILY. 08/22/14   Jerrye Noble, MD  ondansetron (ZOFRAN) 4 MG tablet Take 1 tablet (4 mg total) by mouth every 8 (eight) hours as needed for nausea or vomiting. 07/05/14   Cresenciano Genre, MD  predniSONE (DELTASONE) 50 MG tablet Take 1 pill daily for 5 days. 10/03/14   Melony Overly, MD  rOPINIRole (REQUIP) 1 MG tablet TAKE 1 TABLET (1 MG TOTAL) BY MOUTH AT BEDTIME. 09/18/14   Bartholomew Crews, MD  sertraline (ZOLOFT) 50 MG tablet TAKE 3 TABLETS (150 MG TOTAL) BY MOUTH DAILY. 08/21/14   Milagros Loll, MD  sertraline (ZOLOFT) 50 MG tablet TAKE 3 TABLETS (150 MG TOTAL) BY MOUTH DAILY. 08/22/14   Jerrye Noble, MD  triamcinolone (NASACORT AQ) 55 MCG/ACT AERO  nasal inhaler Place 2 sprays into the nose daily.    Historical Provider, MD   BP 151/88 mmHg  Pulse 76  Temp(Src) 98.1 F (36.7 C) (Oral)  Resp 20  SpO2 94% Physical Exam  Constitutional: She is oriented to person, place, and time. She appears well-developed and well-nourished. No distress.  Cardiovascular: Normal rate, regular rhythm and normal heart sounds.   No murmur heard. Pulmonary/Chest: Effort normal. No respiratory distress. She has wheezes (diffuse expiratory wheezes). She has no rales.  Neurological: She is alert  and oriented to person, place, and time.    ED Course  Procedures (including critical care time) Labs Review Labs Reviewed - No data to display  Imaging Review Dg Chest 2 View  10/03/2014   CLINICAL DATA:  Cough and congestion since March, 2016.  EXAM: CHEST  2 VIEW  COMPARISON:  PA and lateral chest 05/19/2014 and 02/19/2013.  FINDINGS: The lungs are clear. Heart size is normal. No pneumothorax or pleural effusion. Thoracic spondylosis is noted.  IMPRESSION: No acute disease.   Electronically Signed   By: Inge Rise M.D.   On: 10/03/2014 20:14     MDM   1. Bronchitis   2. Acute upper respiratory infection    DuoNeb 2.5-0.5 mg given.  Breathing is much improved after treatment. We'll treat with prednisone, doxycycline, and Atrovent nasal spray. I have refilled her cough syrup. Follow-up as needed.    Melony Overly, MD 10/03/14 2032

## 2014-10-09 ENCOUNTER — Other Ambulatory Visit: Payer: Self-pay | Admitting: Cardiology

## 2014-10-22 ENCOUNTER — Other Ambulatory Visit: Payer: Self-pay | Admitting: Internal Medicine

## 2014-10-22 ENCOUNTER — Other Ambulatory Visit: Payer: Self-pay | Admitting: Cardiology

## 2014-11-17 ENCOUNTER — Telehealth: Payer: Self-pay | Admitting: Internal Medicine

## 2014-11-17 NOTE — Telephone Encounter (Signed)
Call to patient to confirm appointment for 11/20/14 at 2:30 lmtcb

## 2014-11-20 ENCOUNTER — Encounter: Payer: Self-pay | Admitting: Internal Medicine

## 2014-11-20 ENCOUNTER — Ambulatory Visit (INDEPENDENT_AMBULATORY_CARE_PROVIDER_SITE_OTHER): Payer: Self-pay | Admitting: Internal Medicine

## 2014-11-20 VITALS — BP 128/73 | HR 80 | Temp 98.0°F | Ht 63.0 in | Wt 256.9 lb

## 2014-11-20 DIAGNOSIS — R42 Dizziness and giddiness: Secondary | ICD-10-CM

## 2014-11-20 DIAGNOSIS — I10 Essential (primary) hypertension: Secondary | ICD-10-CM

## 2014-11-20 DIAGNOSIS — E118 Type 2 diabetes mellitus with unspecified complications: Secondary | ICD-10-CM

## 2014-11-20 LAB — GLUCOSE, CAPILLARY: Glucose-Capillary: 193 mg/dL — ABNORMAL HIGH (ref 65–99)

## 2014-11-20 LAB — POCT GLYCOSYLATED HEMOGLOBIN (HGB A1C): Hemoglobin A1C: 8.1

## 2014-11-20 MED ORDER — LISINOPRIL 5 MG PO TABS: 5.0000 mg | ORAL_TABLET | Freq: Every day | ORAL | Status: DC

## 2014-11-20 MED ORDER — ATORVASTATIN CALCIUM 80 MG PO TABS: 80.0000 mg | ORAL_TABLET | Freq: Every day | ORAL | Status: DC

## 2014-11-20 NOTE — Assessment & Plan Note (Signed)
Her blood pressure was well-controlled on today's visit and she was actually complaining of orthostatic hypotension, although her orthostatic vitals were normal in the office. Her lisinopril was stopped back in June because it was thought this may be contributing to her chronic cough. I'll re-start her lisinopril today at 45m for her proteinuria. I educated her on the importance of drinking water and not so many mountain dews, as this is probably dehydrating her. She may have a component of autonomic neuropathy contributing to her orthostatic symptoms as well, so hopefully tightening her glycemic control with metformin will help as well as simple hydration.

## 2014-11-20 NOTE — Progress Notes (Signed)
Patient ID: Kendra Adams, female   DOB: 1960-06-13, 54 y.o.   MRN: [id number]   Subjective:   Patient ID: Kendra Adams female   DOB: Sep 15, 1960 54 y.o.   MRN: [id number]  HPI: Kendra Adams is a 55 y.o. with history of CAD, T2DM, vertigo, here for medication refill, complaining also of vertigo when lying down and pain during intercourse. Please see problem-based charting for assessment and plan.  Past Medical History  Diagnosis Date  . Carpal tunnel syndrome on both sides     right hand surgery 1998, left hand surgery 1999.  . Allergic rhinitis   . Asthma   . Depression   . GERD (gastroesophageal reflux disease)   . Hyperlipidemia   . Hypothyroidism   . Psoriasis   . CAD (coronary artery disease)     cath 12/13/10: mRCA 99%, EF 65-70%;  s/p BMS to mRCA  . HTN (hypertension)   . DM2 (diabetes mellitus, type 2)   . Hyperlipidemia   . MI (myocardial infarction)    Current Outpatient Prescriptions  Medication Sig Dispense Refill  . acetaminophen (TYLENOL) 325 MG tablet Take 650 mg by mouth every 4 (four) hours as needed for mild pain.    Marland Kitchen albuterol (PROVENTIL HFA;VENTOLIN HFA) 108 (90 BASE) MCG/ACT inhaler Inhale 1-2 puffs into the lungs every 6 (six) hours as needed for wheezing or shortness of breath. 1 Inhaler 0  . aspirin 81 MG chewable tablet Chew 81 mg by mouth daily.    Marland Kitchen atorvastatin (LIPITOR) 80 MG tablet Take 80 mg by mouth daily.     . Blood Glucose Monitoring Suppl DEVI 1 Device by Does not apply route daily before breakfast. 1 each 0  . Fluticasone-Salmeterol (ADVAIR) 100-50 MCG/DOSE AEPB Inhale 2 puffs into the lungs 2 (two) times daily.    . Glucose Blood (BLOOD GLUCOSE TEST STRIPS) STRP 1 strip by In Vitro route daily before breakfast. 100 each 0  . ipratropium (ATROVENT) 0.06 % nasal spray Place 2 sprays into both nostrils 4 (four) times daily. 15 mL 0  . Lancets MISC 1 Units by Does not apply route daily. 50 each 0  . levothyroxine (SYNTHROID, LEVOTHROID) 150 MCG  tablet TAKE 1 TABLET BY MOUTH DAILY BEFORE BREAKFAST. 30 tablet 11  . levothyroxine (SYNTHROID, LEVOTHROID) 25 MCG tablet Take 25 mcg by mouth daily before breakfast.    . loratadine (CLARITIN) 10 MG tablet Take 1 tablet (10 mg total) by mouth daily. 30 tablet 11  . metFORMIN (GLUCOPHAGE) 1000 MG tablet TAKE 1 TABLET (1,000 MG TOTAL) BY MOUTH 2 (TWO) TIMES DAILY. 60 tablet 11  . metoprolol succinate (TOPROL-XL) 50 MG 24 hr tablet Take 1 tablet (50 mg total) by mouth daily. Take with or immediately following a meal. 30 tablet 0  . nitroGLYCERIN (NITROSTAT) 0.4 MG SL tablet Place 1 tablet (0.4 mg total) under the tongue every 5 (five) minutes as needed for chest pain. 25 tablet 3  . Omega-3 Fatty Acids (FISH OIL) 1000 MG CAPS Take 1 capsule by mouth daily.    Marland Kitchen omeprazole (PRILOSEC) 40 MG capsule TAKE 1 CAPSULE (40 MG TOTAL) BY MOUTH DAILY. 30 capsule 3  . rOPINIRole (REQUIP) 1 MG tablet TAKE 1 TABLET (1 MG TOTAL) BY MOUTH AT BEDTIME. 30 tablet 5  . sertraline (ZOLOFT) 50 MG tablet TAKE 3 TABLETS (150 MG TOTAL) BY MOUTH DAILY. 90 tablet 2  . glipiZIDE (GLUCOTROL) 5 MG tablet TAKE 1 TABLET BY MOUTH DAILY BEFORE BREAKFAST (Patient not  taking: Reported on 11/20/2014) 30 tablet 12  . meclizine (ANTIVERT) 12.5 MG tablet Take 1 tablet (12.5 mg total) by mouth 3 (three) times daily as needed for dizziness. (Patient not taking: Reported on 11/20/2014) 30 tablet 0   No current facility-administered medications for this visit.   Family History  Problem Relation Age of Onset  . Diabetes Mother   . Hypertension Mother   . Diabetes Father   . Hypertension Father    Social History   Social History  . Marital Status: Married    Spouse Name: N/A  . Number of Children: 0  . Years of Education: 10th grade   Occupational History  . Conservation officer, nature     self-employed  . 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 Smoking status: Current Some Day Smoker -- 0.50 packs/day for 40 years    Types:  Cigarettes    Last Attempt to Quit: 05/19/2014  . Smokeless tobacco: Current User     Comment: Trying to cut back.,   . Alcohol Use: No  . Drug Use: No  . Sexual Activity: Not Asked   Other Topics Concern  . None   Social History Narrative   Father died of suicide (gunshot) in 1996-05-30. 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       Patient lives with her husband, her husband's niece Tammy who is mentally retarded and her best friend and the friend's daughter and 6 dogs               Review of Systems  Constitutional: Negative for fever, chills, weight loss, malaise/fatigue and diaphoresis.  Eyes: Negative for blurred vision and pain.  Respiratory: Negative for cough, hemoptysis and shortness of breath.   Cardiovascular: Negative for chest pain, palpitations, orthopnea, claudication and leg swelling.  Gastrointestinal: Negative for nausea, vomiting and abdominal pain.  Genitourinary: Negative for dysuria and urgency.  Musculoskeletal: Negative for myalgias.  Skin: Positive for rash.  Neurological: Positive for dizziness. Negative for tingling, loss of consciousness and headaches.  Psychiatric/Behavioral: Negative for depression, suicidal ideas and substance abuse.    Objective:  Physical Exam: Filed Vitals:   11/20/14 1433  BP: 128/73  Pulse: 80  Temp: 98 F (36.7 C)  TempSrc: Oral  Height: 5' 3"  (1.6 m)  Weight: 256 lb 14.4 oz (116.529 kg)  SpO2: 98%  Physical Exam  Constitutional: She appears well-developed and well-nourished.  HENT:  Head: Normocephalic and atraumatic.  Eyes: Conjunctivae and EOM are normal.  Neck: Normal range of motion. Neck supple.  Cardiovascular: Normal rate, regular rhythm and normal heart sounds.   Pulmonary/Chest: Effort normal. She has wheezes.  Abdominal: Soft. She exhibits distension.  Musculoskeletal: Normal range of motion.  Skin: Skin is warm  and dry. Rash noted.  Interdigital dyshydrotic eczyma  Psychiatric: She has a normal mood and affect. Her behavior is normal. Thought content normal.    Assessment & Plan:

## 2014-11-20 NOTE — Assessment & Plan Note (Signed)
Her A1c today was 8.1. She was not taking her metformin at night because she kept forgetting and she is drinking around 5 mountain dews per day. She was on glipizide in the past but she stopped taking it because her sugars were dropping into the 50s and making her lightheaded. Today, I emphasized the importance of taking her metformin TWICE daily and cutting back on the mountain dews. She was encouraged by the plan and agreeable to cut back on the sodas. Hopefully she will be able to get insurance soon, which I highly recommended. If she does, we may be able to try another oral agent in the future.

## 2014-11-20 NOTE — Patient Instructions (Signed)
Ms. Pfahler,  It was a pleasure meeting you today.  We talked about a few things:  1) Your diabetes level is right at the bad end of normal. I think if you can cut out the mountain dew, and start taking your metformin twice a day, that might do the trick to get that A1c number back down. Let's give that a shot and see how you're doing in 1-2 months.  2) Your dysequilibrium is from something called "benign paroxysmal peripheral vertigo," a big name which basically says you get dizzy when you change your head position. Youtube the Epley maneuver and see if that gives you some relief. If that doesn't help, we can talk about other options at the next visit.  3) Your pain during intercourse is probably from vaginal dryness. Try using more lubricant and if that doesn't work, we can talk about other options at the next visit.  4) Let me know what cream you were using on your eczyma in between your fingertips and I'll re-fill it for you.  It was great meeting you and I look forward to seeing you at the next visit, Dr. Melburn Hake

## 2014-11-20 NOTE — Assessment & Plan Note (Signed)
She continues to complain of intermittent positional vertigo which I suspect is peripheral given her non-focal neurologic exam. Thus I don't think she requires an MRI at this time. She was familiar with the Epley maneuver which she said she'll try again. I recommended she stop taking meclizine because she denies any nausea and this will help decrease her medication burden.

## 2014-11-21 NOTE — Addendum Note (Signed)
Addended by: Lalla Brothers T on: 11/21/2014 02:27 PM   Modules accepted: Level of Service

## 2014-11-21 NOTE — Progress Notes (Signed)
Internal Medicine Clinic Attending  I saw and evaluated the patient.  I personally confirmed the key portions of the history and exam documented by Dr. Flores and I reviewed pertinent patient test results.  The assessment, diagnosis, and plan were formulated together and I agree with the documentation in the resident's note.  

## 2014-11-29 ENCOUNTER — Other Ambulatory Visit: Payer: Self-pay | Admitting: Internal Medicine

## 2014-12-16 ENCOUNTER — Encounter (HOSPITAL_COMMUNITY): Payer: Self-pay | Admitting: Emergency Medicine

## 2014-12-16 ENCOUNTER — Emergency Department (HOSPITAL_COMMUNITY)
Admission: EM | Admit: 2014-12-16 | Discharge: 2014-12-16 | Disposition: A | Discharge status: Home / Self Care | Payer: Self-pay | Source: Home / Self Care

## 2014-12-16 DIAGNOSIS — F17200 Nicotine dependence, unspecified, uncomplicated: Secondary | ICD-10-CM

## 2014-12-16 DIAGNOSIS — J40 Bronchitis, not specified as acute or chronic: Secondary | ICD-10-CM

## 2014-12-16 DIAGNOSIS — L03119 Cellulitis of unspecified part of limb: Secondary | ICD-10-CM

## 2014-12-16 DIAGNOSIS — S2002XA Contusion of left breast, initial encounter: Secondary | ICD-10-CM

## 2014-12-16 MED ORDER — METHYLPREDNISOLONE 4 MG PO TBPK: ORAL_TABLET | ORAL | Status: DC

## 2014-12-16 MED ORDER — DOXYCYCLINE HYCLATE 100 MG PO CAPS: 100.0000 mg | ORAL_CAPSULE | Freq: Two times a day (BID) | ORAL | Status: DC

## 2014-12-16 NOTE — Discharge Instructions (Signed)

## 2014-12-16 NOTE — ED Provider Notes (Signed)
CSN: [id number]     Arrival date & time 12/16/14  1518 History   None    Chief Complaint  Patient presents with  . Leg Injury  . Chest Injury   (Consider location/radiation/quality/duration/timing/severity/associated sxs/prior Treatment) HPI Comments: Patient also c/o URI sx's, cough, DOE, and having to use short acting MDI often.  She states she has clear sputum and she has a lot of night time symptoms.  The history is provided by the patient.    Past Medical History  Diagnosis Date  . Carpal tunnel syndrome on both sides     right hand surgery 1998, left hand surgery 1999.  . Allergic rhinitis   . Asthma   . Depression   . GERD (gastroesophageal reflux disease)   . Hyperlipidemia   . Hypothyroidism   . Psoriasis   . CAD (coronary artery disease)     cath 12/13/10: mRCA 99%, EF 65-70%;  s/p BMS to mRCA  . HTN (hypertension)   . DM2 (diabetes mellitus, type 2) ([place])   . Hyperlipidemia   . MI (myocardial infarction) [facility])    Past Surgical History  Procedure Laterality Date  . Coronary angioplasty with stent placement    . Carpal tunnel release      w/ bone spurs  . Dilation and curettage of uterus     Family History  Problem Relation Age of Onset  . Diabetes Mother   . Hypertension Mother   . Diabetes Father   . Hypertension Father    Social History  Substance Use Topics  . Smoking status: Current Some Day Smoker -- 0.50 packs/day for 40 years    Types: Cigarettes    Last Attempt to Quit: 05/19/2014  . Smokeless tobacco: Current User     Comment: Trying to cut back.,   . Alcohol Use: No   OB History    No data available     Review of Systems  Constitutional: Positive for fatigue.  HENT: Positive for congestion.   Eyes: Negative.   Respiratory: Positive for cough, chest tightness, shortness of breath and wheezing.   Cardiovascular: Negative.   Gastrointestinal: Negative.   Endocrine: Negative.   Genitourinary: Negative.   Skin: Positive for wound.     Left knee abrasion, right leg wound from insect bite, and left breast hematoma  Hematological:       Left breast hematoma  Psychiatric/Behavioral: Negative.     Allergies  Gabapentin; Nsaids; and Tramadol hcl  Home Medications   Prior to Admission medications   Medication Sig Start Date End Date Taking? Authorizing Provider  acetaminophen (TYLENOL) 325 MG tablet Take 650 mg by mouth every 4 (four) hours as needed for mild pain.    Historical Provider, MD  albuterol (PROVENTIL HFA;VENTOLIN HFA) 108 (90 BASE) MCG/ACT inhaler Inhale 1-2 puffs into the lungs every 6 (six) hours as needed for wheezing or shortness of breath. 05/20/14   Wandra Arthurs, MD  aspirin 81 MG chewable tablet Chew 81 mg by mouth daily.    Historical Provider, MD  atorvastatin (LIPITOR) 80 MG tablet Take 1 tablet (80 mg total) by mouth daily. 11/20/14   Loleta Chance, MD  Blood Glucose Monitoring Suppl DEVI 1 Device by Does not apply route daily before breakfast. 05/23/14   Ejiroghene E Denton Brick, MD  Fluticasone-Salmeterol (ADVAIR) 100-50 MCG/DOSE AEPB Inhale 2 puffs into the lungs 2 (two) times daily.    Historical Provider, MD  glipiZIDE (GLUCOTROL) 5 MG tablet TAKE 1 TABLET BY MOUTH DAILY BEFORE  BREAKFAST Patient not taking: Reported on 11/20/2014 07/18/14   Jerrye Noble, MD  Glucose Blood (BLOOD GLUCOSE TEST STRIPS) STRP 1 strip by In Vitro route daily before breakfast. 05/23/14   Ejiroghene E Emokpae, MD  ipratropium (ATROVENT) 0.06 % nasal spray Place 2 sprays into both nostrils 4 (four) times daily. 10/03/14   Melony Overly, MD  Lancets MISC 1 Units by Does not apply route daily. 05/23/14   Ejiroghene Arlyce Dice, MD  levothyroxine (SYNTHROID, LEVOTHROID) 150 MCG tablet TAKE 1 TABLET BY MOUTH DAILY BEFORE BREAKFAST. 05/05/14   Jerrye Noble, MD  levothyroxine (SYNTHROID, LEVOTHROID) 25 MCG tablet Take 25 mcg by mouth daily before breakfast.    Historical Provider, MD  lisinopril (PRINIVIL,ZESTRIL) 5 MG tablet Take 1 tablet (5 mg  total) by mouth daily. 11/20/14 11/20/15  Loleta Chance, MD  loratadine (CLARITIN) 10 MG tablet Take 1 tablet (10 mg total) by mouth daily. 09/07/14   Lucious Groves, DO  metFORMIN (GLUCOPHAGE) 1000 MG tablet TAKE 1 TABLET (1,000 MG TOTAL) BY MOUTH 2 (TWO) TIMES DAILY. 09/18/14   Bartholomew Crews, MD  metoprolol succinate (TOPROL-XL) 50 MG 24 hr tablet Take 1 tablet (50 mg total) by mouth daily. Take with or immediately following a meal. 09/12/14   Dorothy Spark, MD  nitroGLYCERIN (NITROSTAT) 0.4 MG SL tablet Place 1 tablet (0.4 mg total) under the tongue every 5 (five) minutes as needed for chest pain. 04/05/14   Dorothy Spark, MD  Omega-3 Fatty Acids (FISH OIL) 1000 MG CAPS Take 1 capsule by mouth daily.    Historical Provider, MD  omeprazole (PRILOSEC) 40 MG capsule TAKE 1 CAPSULE (40 MG TOTAL) BY MOUTH DAILY. 08/22/14   Jerrye Noble, MD  rOPINIRole (REQUIP) 1 MG tablet TAKE 1 TABLET (1 MG TOTAL) BY MOUTH AT BEDTIME. 09/18/14   Bartholomew Crews, MD  sertraline (ZOLOFT) 50 MG tablet TAKE 3 TABLETS (150 MG TOTAL) BY MOUTH DAILY. 08/21/14   Milagros Loll, MD  sertraline (ZOLOFT) 50 MG tablet TAKE 3 TABLETS BY MOUTH DAILY 12/04/14   Loleta Chance, MD   Meds Ordered and Administered this Visit  Medications - No data to display  BP 134/85 mmHg  Pulse 104  Temp(Src) 98 F (36.7 C) (Oral)  Resp 20  SpO2 97% No data found.   Physical Exam  Constitutional: She appears well-developed and well-nourished.  Obese female  HENT:  Head: Normocephalic and atraumatic.  Eyes: Conjunctivae and EOM are normal. Pupils are equal, round, and reactive to light.  Neck: Normal range of motion. Neck supple.  Cardiovascular: Normal rate and regular rhythm.   Pulmonary/Chest: Effort normal. She has wheezes.  Breast sounds diminished throughout  Abdominal: Soft. Bowel sounds are normal.  Skin: There is erythema.  Left knee cap with abrasion that is mildly erythemic with scabs and TTP.  Left pre-tibial area  with wound with various degrees of healing.  No ulcer.  Bilateral venous stasis LE's.  Left breast with ecchymosis and no tenderness.  Psychiatric: She has a normal mood and affect. Her behavior is normal.    ED Course  Procedures (including critical care time)  Labs Review Labs Reviewed - No data to display  Imaging Review No results found.   Visual Acuity Review  Right Eye Distance:   Left Eye Distance:   Bilateral Distance:    Right Eye Near:   Left Eye Near:    Bilateral Near:  MDM  Left breast contusion - Apply heat  Bronchitis - DC smoking, take doxycycline 170m po bid x 10 days, take MDI as directed, mucinex otc and Medrol dose pack as directed.    Cellulitis - Doxycycline 1067mpo bid x 10 days.  WiBostoniaFNP 12/16/14 16LorainFNAsbury0/08/16 [phone]

## 2014-12-16 NOTE — ED Notes (Signed)
The patient presented to the [facility] with a complaint of an abrasion to the left leg, swelling to the left foot and a large hematoma to the left breast secondary to a fall that occurred 3 days ago. The patient stated that she tripped in a parking lot and fell. The patient denied an LOC.

## 2014-12-19 ENCOUNTER — Other Ambulatory Visit: Payer: Self-pay | Admitting: Cardiology

## 2014-12-24 ENCOUNTER — Other Ambulatory Visit: Payer: Self-pay | Admitting: Cardiology

## 2015-01-19 ENCOUNTER — Ambulatory Visit (INDEPENDENT_AMBULATORY_CARE_PROVIDER_SITE_OTHER): Payer: Self-pay | Admitting: Internal Medicine

## 2015-01-19 ENCOUNTER — Encounter: Payer: Self-pay | Admitting: Internal Medicine

## 2015-01-19 VITALS — BP 143/78 | HR 92 | Temp 98.2°F | Ht 63.0 in | Wt 256.5 lb

## 2015-01-19 DIAGNOSIS — M7582 Other shoulder lesions, left shoulder: Secondary | ICD-10-CM | POA: Insufficient documentation

## 2015-01-19 DIAGNOSIS — Z23 Encounter for immunization: Secondary | ICD-10-CM

## 2015-01-19 DIAGNOSIS — S82002A Unspecified fracture of left patella, initial encounter for closed fracture: Secondary | ICD-10-CM | POA: Insufficient documentation

## 2015-01-19 DIAGNOSIS — Z0000 Encounter for general adult medical examination without abnormal findings: Secondary | ICD-10-CM

## 2015-01-19 DIAGNOSIS — I2510 Atherosclerotic heart disease of native coronary artery without angina pectoris: Secondary | ICD-10-CM

## 2015-01-19 LAB — GLUCOSE, CAPILLARY: Glucose-Capillary: 193 mg/dL — ABNORMAL HIGH (ref 65–99)

## 2015-01-19 MED ORDER — METOPROLOL SUCCINATE ER 100 MG PO TB24: 100.0000 mg | ORAL_TABLET | Freq: Every day | ORAL | Status: DC

## 2015-01-19 MED ORDER — NITROGLYCERIN 0.4 MG SL SUBL: 0.4000 mg | SUBLINGUAL_TABLET | SUBLINGUAL | Status: DC | PRN

## 2015-01-19 MED ORDER — ALBUTEROL SULFATE HFA 108 (90 BASE) MCG/ACT IN AERS: 1.0000 | INHALATION_SPRAY | Freq: Four times a day (QID) | RESPIRATORY_TRACT | Status: DC | PRN

## 2015-01-19 NOTE — Progress Notes (Signed)
Medicine attending: I personally interviewed and briefly examined this patient on the day of the patient visit and reviewed pertinent clinical ,laboratory, and EKG data  with resident physician Dr. Gwenlyn Fudge and we discussed a management plan. ROS reveals she is have intermittent ischemic quality chest pain. She has not used a NTG tab in months. She has a right coronary stent. She had a normal stress test 8/15. We will get a short interval follow up with Cardiology. Increase her beta-blocker. Refill her nitrostat.

## 2015-01-19 NOTE — Assessment & Plan Note (Signed)
-  Received flu shot today

## 2015-01-19 NOTE — Patient Instructions (Addendum)
Please take the increased dose of toprol xl (now 125m). You can take naproxen for your shoulder pain along with the acetaminophen. If your reflux gets worse, stop taking the naproxen and continue to use tylenol with ice and the pads. Also, rest is most important.  Call your cardiologist today to schedule a follow-up appointment.  Please schedule with DButch Pennyon the same day as your next appointment here for your eye exam.  Thanks, BBlane Ohara

## 2015-01-19 NOTE — Progress Notes (Signed)
   Patient ID: Kendra Adams female   DOB: 07-Jul-1960 54 y.o.   MRN: [id number]  Subjective:   HPI: Kendra Adams is a 54 y.o. with PMH of CAD, HTN, DM2 who presents to [facility] today for evaluation of shoulder pain, knee pain, and chest pain.  She does yardwork oddjobs on the side and has been working more lately and has noticed that her rotator cuff tendinitis has gotten worse over the past month when she does yardwork or reaches up for things. The pain is located in her anterior shoulder, improved with rest and tylenol. She says cortisone shots have improved the pain in the past.  She also notes tripping over concrete in the parking lot ~1 month ago and falling on her L knee. She was initially unable to bear weight on that knee, but it has slowly improved over the past 1 month. She still occasionally gets pain in the anterior knee and can feel 'a fracture line' across the [place]. She has pain no where else in her knee and denies other symptoms such as numbness, tingling, a popping sensation, swelling, or color change over the joint.  Finally, she endorses substernal chest pain that is non-radiating, accompanied by palpitations and shortness of breath, brought on by exertion and relieved by rest that have worsened over the past 1 month. She says she has had 1-2 episodes of chest pain at rest as well. She denies lightheadedness, diaphoresis, N/V/D, weakness, or any other symptoms. She has not taken any nitro, as these episodes subside with rest  Please see problem-based charting for status of medical issues pertinent to this visit.  Review of Systems: Pertinent items noted in HPI and remainder of comprehensive ROS otherwise negative.  Objective:  Physical Exam: Filed Vitals:   01/19/15 1416  BP: 143/78  Pulse: 92  Temp: 98.2 F (36.8 C)  TempSrc: Oral  Height: 5' 3"  (1.6 m)  Weight: 256 lb 8 oz (116.348 kg)  SpO2: 96%   Gen: Well-appearing, alert and oriented to person, place, and  time HEENT: Oropharynx clear without erythema or exudate.  Neck: No cervical LAD, no thyromegaly or nodules, no JVD noted. CV: Normal rate, regular rhythm, no murmurs, rubs, or gallops Pulmonary: Normal effort, CTA bilaterally, no wheezing, rales, or rhonchi Abdominal: Soft, non-tender, non-distended, without rebound, guarding, or masses Extremities: Distal pulses 2+ in upper and lower extremities bilaterally, no tenderness, erythema or edema. Pain on shoulder abduction and flexion on the L side. R shoulder normal. No tenderness to palpation. There is a fracture line palpated along the L patella. Normal mobility, only mild tenderness to palpation. No effusion or skin changes over the knee. The R knee is normal in appearance. Skin: No atypical appearing moles. No rashes  Assessment & Plan:  Please see problem-based charting for assessment and plan.  Blane Ohara, MD Resident Physician, PGY-1 Department of Internal Medicine [facility]

## 2015-01-19 NOTE — Assessment & Plan Note (Signed)
Symptoms most consistent with rotator cuff tendinitis. No focal tenderness of exam, no inciting injury, simply history of repetitive use. -Continue acetaminophen -Start naproxen -Counseled on other supportive measures -If still no relief at follow-up, consider steroid injection, which has helped in the past

## 2015-01-19 NOTE — Assessment & Plan Note (Signed)
Pt notes worsening of her anginal pain, with some atypical features noted as previously described. Not currently having chest pain. She last saw cardiology last year. Myoview in 2015 was normal. Currently on toprol, lisinopril, lipitor, ASA, SL nitro. Most likely worsening of her CAD. -Increased toprol xl to 128m (from 56m. Can also consider adding imdur if symptoms persist -Refilled her nitro and instructed on use -Continue ASA 81, lipitor, lisinopril -Instructed pt to call cardiology today and make an appt. Also put in an order here. -Counseled on smoking cessation  -F/u with PCP in 4 weeks or earlier if sx don't improve

## 2015-01-19 NOTE — Assessment & Plan Note (Signed)
History of falling on L knee, progressively improving pain with weight bearing in the kneecap x 1 month. Exam shows non-displaced patella with apparent fracture line across the entire patella that is not present on R side. Remainder of knee exam is normal. Most consistent with a closed, non-displaced patellar fracture. -Continue supportive care -Acetaminophen, naproxen PRN -Weight bearing as tolerated -May consider X-ray if does not continue to improve over the next few weeks.

## 2015-01-20 ENCOUNTER — Other Ambulatory Visit: Payer: Self-pay | Admitting: Internal Medicine

## 2015-01-22 ENCOUNTER — Other Ambulatory Visit: Payer: Self-pay | Admitting: Internal Medicine

## 2015-01-22 ENCOUNTER — Telehealth: Payer: Self-pay

## 2015-01-22 MED ORDER — SERTRALINE HCL 100 MG PO TABS: 150.0000 mg | ORAL_TABLET | Freq: Every day | ORAL | Status: DC

## 2015-01-22 NOTE — Telephone Encounter (Signed)
Pharmacy asking if sertraline 69m #90 3 tablets daily can be updated to sertraline 1032m#45 1 1/2 tabs daily in order to be less expensive for patient.  Please advise.

## 2015-01-23 ENCOUNTER — Encounter: Payer: Self-pay | Admitting: Student

## 2015-01-28 ENCOUNTER — Other Ambulatory Visit: Payer: Self-pay | Admitting: Internal Medicine

## 2015-02-03 ENCOUNTER — Encounter (HOSPITAL_COMMUNITY): Payer: Self-pay | Admitting: *Deleted

## 2015-02-03 ENCOUNTER — Emergency Department (INDEPENDENT_AMBULATORY_CARE_PROVIDER_SITE_OTHER)
Admission: EM | Admit: 2015-02-03 | Discharge: 2015-02-03 | Disposition: A | Discharge status: Home / Self Care | Payer: Self-pay | Source: Home / Self Care | Attending: Emergency Medicine | Admitting: Emergency Medicine

## 2015-02-03 ENCOUNTER — Emergency Department (INDEPENDENT_AMBULATORY_CARE_PROVIDER_SITE_OTHER): Payer: Self-pay

## 2015-02-03 DIAGNOSIS — J45901 Unspecified asthma with (acute) exacerbation: Secondary | ICD-10-CM

## 2015-02-03 MED ORDER — AZITHROMYCIN 250 MG PO TABS: 250.0000 mg | ORAL_TABLET | Freq: Every day | ORAL | Status: DC

## 2015-02-03 MED ORDER — ALBUTEROL SULFATE HFA 108 (90 BASE) MCG/ACT IN AERS: 2.0000 | INHALATION_SPRAY | RESPIRATORY_TRACT | Status: DC | PRN

## 2015-02-03 MED ORDER — GUAIFENESIN-CODEINE 100-10 MG/5ML PO SYRP: 5.0000 mL | ORAL_SOLUTION | Freq: Four times a day (QID) | ORAL | Status: DC | PRN

## 2015-02-03 MED ORDER — PREDNISONE 50 MG PO TABS
50.0000 mg | ORAL_TABLET | Freq: Every day | ORAL | Status: DC
Start: 2015-02-03 — End: 2015-02-13

## 2015-02-03 MED ORDER — AEROCHAMBER PLUS MISC: Status: AC

## 2015-02-03 NOTE — Discharge Instructions (Signed)
Your sugars will go up while you're taking the prednisone. They should normalize after you finish it. 2 puffs from your albuterol inhaler using a spacer every 4-6 hours as needed. Follow up with your primary care physician in several days. Go to the ER if you get worse.

## 2015-02-03 NOTE — ED Notes (Signed)
C/O severe productive cough x 1 month with progressive worsening.  Has been using Mucinex, Delsym, and Robitussin DM without any improvement.  Has been using ProAir approx once/day.  Denies fevers.

## 2015-02-03 NOTE — ED Provider Notes (Signed)
HPI  SUBJECTIVE:  Kendra Adams is a 54 y.o. female who presents with 1 month of a cough which has now become productive of thick, yellow-green mucus. She reports wheezing, shortness of breath. She states that this just started off as just a cough, she denies any URI-like symptoms preceding it. She reports some nausea, no vomiting. No fevers. Sharp, achy chest pain after coughing, but no other chest pain. She denies any new dyspnea on exertion/decreased exercise tolerance. Some mild ankle edema, but no weight gain, orthopnea, PND, nocturia.  she denies any weight loss. No abdominal pain. No allergy symptoms. No aggravating or alleviating factors. She tried Mucinex, Robitussin-DM, Delsym, her albuterol inhaler 3-4 times a day. Normally she only uses this this when necessary. She has a past medical history of asthma, is a long-time smoker. States that she is in the process of quitting, but admits to still smoking. Also History of diabetes, hypertension, pneumonia, MI, coronary artery disease status post stent. No history of congestive heart failure, PE, DVT. States this is identical to previous episodes of asthmatic bronchitis which have been successfully treated with steroids, antibiotics, cough syrup.  Past Medical History  Diagnosis Date  . Carpal tunnel syndrome on both sides     right hand surgery 1998, left hand surgery 1999.  . Allergic rhinitis   . Asthma   . Depression   . GERD (gastroesophageal reflux disease)   . Hyperlipidemia   . Hypothyroidism   . Psoriasis   . CAD (coronary artery disease)     cath 12/13/10: mRCA 99%, EF 65-70%;  s/p BMS to mRCA  . HTN (hypertension)   . DM2 (diabetes mellitus, type 2) ([place])   . Hyperlipidemia   . MI (myocardial infarction) ([place])   . Cellulitis, leg     Past Surgical History  Procedure Laterality Date  . Coronary angioplasty with stent placement    . Carpal tunnel release      w/ bone spurs  . Dilation and curettage of uterus       Family History  Problem Relation Age of Onset  . Diabetes Mother   . Hypertension Mother   . Diabetes Father   . Hypertension Father     Social History  Substance Use Topics  . Smoking status: Former Smoker -- 0.50 packs/day for 40 years    Types: Cigarettes    Quit date: 05/19/2014  . Smokeless tobacco: Current User     Comment: Trying to cut back.,   . Alcohol Use: No    No current facility-administered medications for this encounter.  Current outpatient prescriptions:  .  acetaminophen (TYLENOL) 325 MG tablet, Take 650 mg by mouth every 4 (four) hours as needed for mild pain., Disp: , Rfl:  .  albuterol (PROVENTIL HFA;VENTOLIN HFA) 108 (90 BASE) MCG/ACT inhaler, Inhale 1-2 puffs into the lungs every 6 (six) hours as needed for wheezing or shortness of breath., Disp: 1 Inhaler, Rfl: 0 .  atorvastatin (LIPITOR) 80 MG tablet, Take 1 tablet (80 mg total) by mouth daily., Disp: 90 tablet, Rfl: 3 .  Blood Glucose Monitoring Suppl DEVI, 1 Device by Does not apply route daily before breakfast., Disp: 1 each, Rfl: 0 .  Glucose Blood (BLOOD GLUCOSE TEST STRIPS) STRP, 1 strip by In Vitro route daily before breakfast., Disp: 100 each, Rfl: 0 .  ipratropium (ATROVENT) 0.06 % nasal spray, Place 2 sprays into both nostrils 4 (four) times daily., Disp: 15 mL, Rfl: 0 .  Lancets MISC, 1  Units by Does not apply route daily., Disp: 50 each, Rfl: 0 .  levothyroxine (SYNTHROID, LEVOTHROID) 25 MCG tablet, Take 25 mcg by mouth daily before breakfast., Disp: , Rfl:  .  lisinopril (PRINIVIL,ZESTRIL) 5 MG tablet, Take 1 tablet (5 mg total) by mouth daily., Disp: 30 tablet, Rfl: 11 .  loratadine (CLARITIN) 10 MG tablet, Take 1 tablet (10 mg total) by mouth daily., Disp: 30 tablet, Rfl: 11 .  metFORMIN (GLUCOPHAGE) 1000 MG tablet, TAKE 1 TABLET (1,000 MG TOTAL) BY MOUTH 2 (TWO) TIMES DAILY., Disp: 60 tablet, Rfl: 11 .  metoprolol succinate (TOPROL-XL) 100 MG 24 hr tablet, Take 1 tablet (100 mg total) by  mouth daily. Take with or immediately following a meal., Disp: 30 tablet, Rfl: 5 .  Omega-3 Fatty Acids (FISH OIL) 1000 MG CAPS, Take 1 capsule by mouth daily., Disp: , Rfl:  .  omeprazole (PRILOSEC) 40 MG capsule, TAKE 1 CAPSULE BY MOUTH DAILY., Disp: 30 capsule, Rfl: 2 .  rOPINIRole (REQUIP) 1 MG tablet, TAKE 1 TABLET (1 MG TOTAL) BY MOUTH AT BEDTIME., Disp: 30 tablet, Rfl: 4 .  sertraline (ZOLOFT) 100 MG tablet, Take 1.5 tablets (150 mg total) by mouth daily., Disp: 90 tablet, Rfl: 3 .  albuterol (PROVENTIL HFA;VENTOLIN HFA) 108 (90 BASE) MCG/ACT inhaler, Inhale 2 puffs into the lungs every 4 (four) hours as needed for wheezing or shortness of breath. Dispense with aerochamber, Disp: 1 Inhaler, Rfl: 0 .  aspirin 81 MG chewable tablet, Chew 81 mg by mouth daily., Disp: , Rfl:  .  azithromycin (ZITHROMAX) 250 MG tablet, Take 1 tablet (250 mg total) by mouth daily. 2 tabs po on day 1, 1 tab po on days 2-5, Disp: 6 tablet, Rfl: 0 .  Fluticasone-Salmeterol (ADVAIR) 100-50 MCG/DOSE AEPB, Inhale 2 puffs into the lungs 2 (two) times daily., Disp: , Rfl:  .  guaiFENesin-codeine (CHERATUSSIN AC) 100-10 MG/5ML syrup, Take 5 mLs by mouth 4 (four) times daily as needed for cough or congestion., Disp: 120 mL, Rfl: 0 .  nitroGLYCERIN (NITROSTAT) 0.4 MG SL tablet, Place 1 tablet (0.4 mg total) under the tongue every 5 (five) minutes as needed for chest pain., Disp: 25 tablet, Rfl: 3 .  predniSONE (DELTASONE) 50 MG tablet, Take 1 tablet (50 mg total) by mouth daily with breakfast. X 5 days, Disp: 5 tablet, Rfl: 0 .  Spacer/Aero-Holding Chambers (AEROCHAMBER PLUS) inhaler, Use as instructed, Disp: 1 each, Rfl: 2 .  [DISCONTINUED] glipiZIDE (GLUCOTROL) 5 MG tablet, TAKE 1 TABLET BY MOUTH DAILY BEFORE BREAKFAST (Patient not taking: Reported on 11/20/2014), Disp: 30 tablet, Rfl: 12  Allergies  Allergen Reactions  . Gabapentin     Skin rash, lip swelling  . Nsaids     IBU - Rectal bleeds; states ASA & Tylenol OK  .  Tramadol Hcl     "Just doesn't do anything for me"     ROS  As noted in HPI.   Physical Exam  BP 147/86 mmHg  Pulse 95  Temp(Src) 98.5 F (36.9 C) (Oral)  Resp 20  SpO2 94%  Constitutional: Well developed, well nourished, no acute distress Eyes: PERRL, EOMI, conjunctiva normal bilaterally HENT: Normocephalic, atraumatic,mucus membranes moist Respiratory: Diffuse wheezing, scattered rhonchi. No chest wall tenderness. Good to fair air movement Cardiovascular: Normal rate and rhythm, no murmurs, no gallops, no rubs GI: Soft, nondistended, normal bowel sounds, nontender, no rebound, no guarding Back: no CVAT skin: No rash, skin intact Musculoskeletal: Trace edema to shins, calves symmetric, no tenderness,  no deformities Neurologic: Alert & oriented x 3, CN II-XII grossly intact, no motor deficits, sensation grossly intact Psychiatric: Speech and behavior appropriate   ED Course   Medications - No data to display  Orders Placed This Encounter  Procedures  . DG Chest 2 View    Standing Status: Standing     Number of Occurrences: 1     Standing Expiration Date:     Order Specific Question:  Reason for Exam (SYMPTOM  OR DIAGNOSIS REQUIRED)    Answer:  cough 1 mo worsening   No results found for this or any previous visit (from the past 24 hour(s)). Dg Chest 2 View  02/03/2015  CLINICAL DATA:  One month cough. EXAM: CHEST  2 VIEW COMPARISON:  10/03/2014 FINDINGS: Hypoinflation without consolidation or effusion. Cardiomediastinal silhouette is within normal. There are degenerative changes of the spine. IMPRESSION: Hypoinflation without acute cardiopulmonary disease. Electronically Signed   By: Marin Olp M.D.   On: 02/03/2015 15:46    ED Clinical Impression  Asthmatic bronchitis, unspecified asthma severity, with acute exacerbation  ED Assessment/Plan Satting 94% on room air, which is still slightly low compared to previous visits, but is in no respiratory  distress.  Reviewed chest x-ray. No pneumonia. Hyperinflation that consolidation or effusion. See radiology report for details.   Asthmatic bronchitis or more likely  COPD Exacerbation. Given Duration of Cough. Home with Azithromycin in Addition to Regular Bronchodilators, Steroids, Cough Syrup. Patient States That She Has Responded Well to These in the past. Encouraged patient to continue to quit smoking.  Discussed  imaging, MDM, plan and followup with patient . Discussed sn/sx that should prompt return to the UC or ED. Patient  agrees with plan.   *This clinic note was created using Dragon dictation software. Therefore, there may be occasional mistakes despite careful proofreading.  ?  Melynda Ripple, MD 02/03/15 [id number]

## 2015-02-13 ENCOUNTER — Ambulatory Visit (INDEPENDENT_AMBULATORY_CARE_PROVIDER_SITE_OTHER): Payer: PRIVATE HEALTH INSURANCE | Admitting: Physician Assistant

## 2015-02-13 ENCOUNTER — Encounter: Payer: Self-pay | Admitting: Physician Assistant

## 2015-02-13 VITALS — BP 110/64 | HR 79 | Ht 63.0 in | Wt 259.0 lb

## 2015-02-13 DIAGNOSIS — I10 Essential (primary) hypertension: Secondary | ICD-10-CM

## 2015-02-13 DIAGNOSIS — R079 Chest pain, unspecified: Secondary | ICD-10-CM

## 2015-02-13 DIAGNOSIS — E785 Hyperlipidemia, unspecified: Secondary | ICD-10-CM | POA: Diagnosis not present

## 2015-02-13 DIAGNOSIS — M539 Dorsopathy, unspecified: Secondary | ICD-10-CM | POA: Diagnosis not present

## 2015-02-13 DIAGNOSIS — I2510 Atherosclerotic heart disease of native coronary artery without angina pectoris: Secondary | ICD-10-CM

## 2015-02-13 DIAGNOSIS — F17200 Nicotine dependence, unspecified, uncomplicated: Secondary | ICD-10-CM

## 2015-02-13 DIAGNOSIS — R29898 Other symptoms and signs involving the musculoskeletal system: Secondary | ICD-10-CM | POA: Insufficient documentation

## 2015-02-13 NOTE — Progress Notes (Signed)
Cardiology Office Note   Date:  02/13/2015   ID:  Kendra Adams, DOB [birth year], MRN [id number]  PCP:  Loleta Chance, MD  Cardiologist:  Dr. Meda Coffee  Chief Complaint: Neck tightness    History of Present Illness: Kendra Adams is a 54 y.o. female who presents for yearly follow-up. She has a history of CAD status post 2 bare-metal stents to the RCA in October 2012, hyperlipidemia, tobacco use, type 2 diabetes and hypertension. Was having chest pain back in 10/2013 and nuclear stress test was normal.  Recently complains of feeling full in her neck area. Occurs at rest, doesn't get worse with exertion. Lasts a few minutes and occurs once a day. When she had her stents she had similar neck pain but it went into her chest and down her right arm. She is not having any of these symptoms. Internal medicine increase her metoprolol to 100 mg daily. She says she hasn't noticed a difference with this increase. Smoking about 5 cigarettes daily. She also think she's taken the wrong Synthroid dose.   Past Medical History  Diagnosis Date  . Carpal tunnel syndrome on both sides     right hand surgery 1998, left hand surgery 1999.  . Allergic rhinitis   . Asthma   . Depression   . GERD (gastroesophageal reflux disease)   . Hyperlipidemia   . Hypothyroidism   . Psoriasis   . CAD (coronary artery disease)     cath 12/13/10: mRCA 99%, EF 65-70%;  s/p BMS to mRCA  . HTN (hypertension)   . DM2 (diabetes mellitus, type 2) ([place])   . Hyperlipidemia   . MI (myocardial infarction) ([place])   . Cellulitis, leg     Past Surgical History  Procedure Laterality Date  . Coronary angioplasty with stent placement    . Carpal tunnel release      w/ bone spurs  . Dilation and curettage of uterus       Current Outpatient Prescriptions  Medication Sig Dispense Refill  . acetaminophen (TYLENOL) 325 MG tablet Take 650 mg by mouth every 4 (four) hours as needed for mild pain.    Marland Kitchen albuterol (PROVENTIL HFA;VENTOLIN HFA)  108 (90 BASE) MCG/ACT inhaler Inhale 2 puffs into the lungs every 4 (four) hours as needed for wheezing or shortness of breath. Dispense with aerochamber 1 Inhaler 0  . aspirin 81 MG chewable tablet Chew 81 mg by mouth daily.    Marland Kitchen atorvastatin (LIPITOR) 80 MG tablet Take 1 tablet (80 mg total) by mouth daily. 90 tablet 3  . Blood Glucose Monitoring Suppl DEVI 1 Device by Does not apply route daily before breakfast. 1 each 0  . Fluticasone-Salmeterol (ADVAIR) 100-50 MCG/DOSE AEPB Inhale 2 puffs into the lungs 2 (two) times daily.    . Glucose Blood (BLOOD GLUCOSE TEST STRIPS) STRP 1 strip by In Vitro route daily before breakfast. 100 each 0  . guaiFENesin-codeine (CHERATUSSIN AC) 100-10 MG/5ML syrup Take 5 mLs by mouth 4 (four) times daily as needed for cough or congestion. 120 mL 0  . ipratropium (ATROVENT) 0.06 % nasal spray Place 2 sprays into both nostrils 4 (four) times daily. 15 mL 0  . Lancets MISC 1 Units by Does not apply route daily. 50 each 0  . lisinopril (PRINIVIL,ZESTRIL) 5 MG tablet Take 1 tablet (5 mg total) by mouth daily. 30 tablet 11  . loratadine (CLARITIN) 10 MG tablet Take 1 tablet (10 mg total) by mouth daily. 30 tablet 11  .  metFORMIN (GLUCOPHAGE) 1000 MG tablet TAKE 1 TABLET (1,000 MG TOTAL) BY MOUTH 2 (TWO) TIMES DAILY. 60 tablet 11  . metoprolol succinate (TOPROL-XL) 100 MG 24 hr tablet Take 1 tablet (100 mg total) by mouth daily. Take with or immediately following a meal. 30 tablet 5  . nitroGLYCERIN (NITROSTAT) 0.4 MG SL tablet Place 1 tablet (0.4 mg total) under the tongue every 5 (five) minutes as needed for chest pain. 25 tablet 3  . Omega-3 Fatty Acids (FISH OIL) 1000 MG CAPS Take 1 capsule by mouth daily.    Marland Kitchen omeprazole (PRILOSEC) 40 MG capsule TAKE 1 CAPSULE BY MOUTH DAILY. 30 capsule 2  . rOPINIRole (REQUIP) 1 MG tablet TAKE 1 TABLET (1 MG TOTAL) BY MOUTH AT BEDTIME. 30 tablet 4  . sertraline (ZOLOFT) 100 MG tablet Take 1.5 tablets (150 mg total) by mouth daily.  90 tablet 3  . Spacer/Aero-Holding Chambers (AEROCHAMBER PLUS) inhaler Use as instructed 1 each 2  . levothyroxine (SYNTHROID, LEVOTHROID) 150 MCG tablet Take 150 mcg by mouth daily before breakfast. Take with Synthroid 70m    . levothyroxine (SYNTHROID, LEVOTHROID) 25 MCG tablet Take 25 mcg by mouth daily before breakfast.    . [DISCONTINUED] glipiZIDE (GLUCOTROL) 5 MG tablet TAKE 1 TABLET BY MOUTH DAILY BEFORE BREAKFAST (Patient not taking: Reported on 11/20/2014) 30 tablet 12   No current facility-administered medications for this visit.    Allergies:   Gabapentin; Nsaids; and Tramadol hcl    Social History:  The patient  reports that she quit smoking about 8 months ago. Her smoking use included Cigarettes. She has a 20 pack-year smoking history. She uses smokeless tobacco. She reports that she does not drink alcohol or use illicit drugs.   Family History:  The patient's    family history includes Diabetes in her father and mother; Hypertension in her father and mother.    ROS:  Please see the history of present illness.   Otherwise, review of systems are positive for excessive fatigue, snoring, wheezing, anxiety depression muscle and back aches and pains headaches.   All other systems are reviewed and negative.    PHYSICAL EXAM: VS:  BP 110/64 mmHg  Pulse 79  Ht 5' 3"  (1.6 m)  Wt 259 lb (117.482 kg)  BMI 45.89 kg/m2 , BMI Body mass index is 45.89 kg/(m^2). GEN: Obese, in no acute distress Neck: no JVD, HJR, carotid bruits, or masses Cardiac:RRR; no murmurs,gallop, rubs, thrill or heave,  Respiratory:  clear to auscultation bilaterally, normal work of breathing GI: soft, nontender, nondistended, + BS MS: no deformity or atrophy Extremities: without cyanosis, clubbing, edema, good distal pulses bilaterally.  Skin: warm and dry, no rash Neuro:  Strength and sensation are intact    EKG:  EKG is ordered today. The ekg ordered today demonstrates normal sinus rhythm, normal  EKG Recent Labs: 05/23/2014: TSH 2.580 07/05/2014: ALT 49*; BUN 21; Creatinine, Ser 1.11*; Hemoglobin 14.6; Platelets 243; Potassium 3.8; Sodium 137    Lipid Panel    Component Value Date/Time   CHOL 152 02/11/2014 0852   TRIG 141 02/11/2014 0852   HDL 28* 02/11/2014 0852   CHOLHDL 5.4 02/11/2014 0852   VLDL 28 02/11/2014 0852   LDLCALC 96 02/11/2014 0852   LDLDIRECT 142.4 03/21/2011 0843      Wt Readings from Last 3 Encounters:  02/13/15 259 lb (117.482 kg)  01/19/15 256 lb 8 oz (116.348 kg)  11/20/14 256 lb 14.4 oz (116.529 kg)      Other  studies Reviewed: Additional studies/ records that were reviewed today include and review of the records demonstrates:  FINAL ASSESSMENT:  Successful stenting of the mid right coronary artery with a bare-metal stent.   PLAN:  Continue on aspirin and Plavix.             ______________________________ Peter M. Martinique, M.D.         PMJ/MEDQ  D:  12/13/2010  T:  12/14/2010  Job:  Nuclear stress test 10/2013   Impression Exercise Capacity:  Poor exercise capacity. BP Response:  Normal blood pressure response. Patient was hypotensive during Stage 1, then became hypertensive. Clinical Symptoms:  Significant dyspnea ECG Impression:  No significant ST segment change suggestive of ischemia. Comparison with Prior Nuclear Study: No previous nuclear study performed  Overall Impression:  Low risk stress nuclear study with fixed inferior, apical and anterior attenuation artifacts despite 2 day imaging. Poor exercise capacity..  LV Ejection Fraction: 53%.  LV Wall Motion:  NL LV Function; NL Wall Motion     ASSESSMENT AND PLAN:  Neck tightness Patient is having neck tightness at rest without any radiation. She did have similar symptoms when she had her stents back in 2012. Back then it radiated down to her chest and her arm. It has not done this. Her metoprolol was increased to 100 mg daily by internal medicine and has not improved the  pain. Recommend repeat stress Myoview to rule out ischemia. She will check with her insurance to see if she can afford it. She will call us tomorrow.  CAD (coronary artery disease) Stenting of the RCA in 2012. Normal stress Myoview 10/2013. Now having neck tightness. Repeat nuclear stress test.  Essential hypertension Blood pressure controlled  Tobacco use disorder Patient continues to smoke about 5 cigarettes daily. She says she is trying to quit but difficult. The importance of smoking cessation discussed with patient.    Signed, Ermalinda Barrios, PA-C  02/13/2015 [facility] [place], [place], [place]  [address] Phone: [phone]; Fax: [phone]

## 2015-02-13 NOTE — Assessment & Plan Note (Signed)
Patient is having neck tightness at rest without any radiation. She did have similar symptoms when she had her stents back in 2012. Back then it radiated down to her chest and her arm. It has not done this. Her metoprolol was increased to 100 mg daily by internal medicine and has not improved the pain. Recommend repeat stress Myoview to rule out ischemia. She will check with her insurance to see if she can afford it. She will call us tomorrow.

## 2015-02-13 NOTE — Assessment & Plan Note (Signed)
Blood pressure controlled. 

## 2015-02-13 NOTE — Assessment & Plan Note (Signed)
Stenting of the RCA in 2012. Normal stress Myoview 10/2013. Now having neck tightness. Repeat nuclear stress test.

## 2015-02-13 NOTE — Assessment & Plan Note (Signed)
Patient continues to smoke about 5 cigarettes daily. She says she is trying to quit but difficult. The importance of smoking cessation discussed with patient.

## 2015-02-13 NOTE — Patient Instructions (Signed)
Medication Instructions:  Your physician recommends that you continue on your current medications as directed. Please refer to the Current Medication list given to you today.   Labwork: Your physician recommends that you return for a FASTING lipid profile and lft same day as stress test   Testing/Procedures: Your physician has requested that you have en exercise stress myoview. For further information please visit [email or web address]. Please follow instruction sheet, as given.   Follow-Up: Your physician recommends that you schedule a follow-up appointment in: 3-4 months with Dr.Nelson   Any Other Special Instructions Will Be Listed Below (If Applicable).     If you need a refill on your cardiac medications before your next appointment, please call your pharmacy.

## 2015-02-26 ENCOUNTER — Encounter: Payer: Self-pay | Admitting: Internal Medicine

## 2015-02-26 ENCOUNTER — Ambulatory Visit (INDEPENDENT_AMBULATORY_CARE_PROVIDER_SITE_OTHER): Payer: PRIVATE HEALTH INSURANCE | Admitting: Internal Medicine

## 2015-02-26 VITALS — BP 137/80 | HR 72 | Temp 98.2°F | Ht 63.0 in | Wt 261.1 lb

## 2015-02-26 DIAGNOSIS — E119 Type 2 diabetes mellitus without complications: Secondary | ICD-10-CM

## 2015-02-26 DIAGNOSIS — I10 Essential (primary) hypertension: Secondary | ICD-10-CM

## 2015-02-26 DIAGNOSIS — R05 Cough: Secondary | ICD-10-CM | POA: Insufficient documentation

## 2015-02-26 DIAGNOSIS — R053 Chronic cough: Secondary | ICD-10-CM | POA: Insufficient documentation

## 2015-02-26 DIAGNOSIS — F17200 Nicotine dependence, unspecified, uncomplicated: Secondary | ICD-10-CM

## 2015-02-26 DIAGNOSIS — Z0000 Encounter for general adult medical examination without abnormal findings: Secondary | ICD-10-CM

## 2015-02-26 DIAGNOSIS — I2584 Coronary atherosclerosis due to calcified coronary lesion: Secondary | ICD-10-CM

## 2015-02-26 DIAGNOSIS — R238 Other skin changes: Secondary | ICD-10-CM

## 2015-02-26 DIAGNOSIS — E118 Type 2 diabetes mellitus with unspecified complications: Secondary | ICD-10-CM

## 2015-02-26 DIAGNOSIS — F17210 Nicotine dependence, cigarettes, uncomplicated: Secondary | ICD-10-CM

## 2015-02-26 DIAGNOSIS — E039 Hypothyroidism, unspecified: Secondary | ICD-10-CM | POA: Diagnosis not present

## 2015-02-26 DIAGNOSIS — Z7984 Long term (current) use of oral hypoglycemic drugs: Secondary | ICD-10-CM | POA: Diagnosis not present

## 2015-02-26 DIAGNOSIS — R2233 Localized swelling, mass and lump, upper limb, bilateral: Secondary | ICD-10-CM | POA: Diagnosis not present

## 2015-02-26 DIAGNOSIS — I2510 Atherosclerotic heart disease of native coronary artery without angina pectoris: Secondary | ICD-10-CM

## 2015-02-26 DIAGNOSIS — M151 Heberden's nodes (with arthropathy): Secondary | ICD-10-CM

## 2015-02-26 DIAGNOSIS — R350 Frequency of micturition: Secondary | ICD-10-CM

## 2015-02-26 DIAGNOSIS — L918 Other hypertrophic disorders of the skin: Secondary | ICD-10-CM

## 2015-02-26 LAB — POCT URINALYSIS DIPSTICK
Bilirubin, UA: NEGATIVE
Blood, UA: NEGATIVE
Glucose, UA: NEGATIVE
Ketones, UA: NEGATIVE
Leukocytes, UA: NEGATIVE
Nitrite, UA: NEGATIVE
Spec Grav, UA: >=1.03
Urobilinogen, UA: 0.2
pH, UA: 6

## 2015-02-26 LAB — POCT GLYCOSYLATED HEMOGLOBIN (HGB A1C): Hemoglobin A1C: 7.6

## 2015-02-26 LAB — GLUCOSE, CAPILLARY: Glucose-Capillary: 174 mg/dL — ABNORMAL HIGH (ref 65–99)

## 2015-02-26 MED ORDER — INSULIN PEN NEEDLE 32G X 4 MM MISC: 1.0000 [IU]/d | Freq: Every day | Status: DC

## 2015-02-26 MED ORDER — VALSARTAN 40 MG PO TABS: 40.0000 mg | ORAL_TABLET | Freq: Every day | ORAL | Status: DC

## 2015-02-26 MED ORDER — LIRAGLUTIDE 18 MG/3ML ~~LOC~~ SOPN: PEN_INJECTOR | SUBCUTANEOUS | Status: DC

## 2015-02-26 NOTE — Assessment & Plan Note (Signed)
She had a flesh-colored pedunculated papule on her mid lower back, which looked to be an acrochordon. I snipped this off for her, and she was very happy to get rid of it.

## 2015-02-26 NOTE — Assessment & Plan Note (Signed)
She has multiple DIP nodules, with some scaly plaques in her conchal bowls, concerning for psoriatic arthritis. She tells me her grandmother had very similarly appearing bumps on her fingers. She doesn't have any other objective evidence of arthritis. I got hand x-rays and an ESR today. If erosive arthritis is noted, I'd like to refer her to rheumatology to consider starting DMARD therapy.

## 2015-02-26 NOTE — Assessment & Plan Note (Signed)
I provided her stool cards, and referred her for a mammogram today. Since I was drawing blood to check her TSH anyway, I also checked a hepatitis C antibody.

## 2015-02-26 NOTE — Assessment & Plan Note (Signed)
Her A1c today was 7.6. She is currently on metformin 1000 mg twice daily. She was on glipizide 5 mg daily, but had symptomatic hypoglycemia. Because she is overweight, and she failed sulfonylurea therapy, I started her on Victoza today. However, I'm concerned her insurance may not cover this; if it's too expensive, I asked her to call me back and  I'll start glipizide 2.5 mg daily.

## 2015-02-26 NOTE — Progress Notes (Signed)
Patient ID: Kendra Adams, female   DOB: 06-29-1960, 54 y.o.   MRN: [id number] [facility] Subjective:   Patient ID: Kendra Adams female   DOB: February 14, 1961 54 y.o.   MRN: [id number]  HPI: [name] is a 54 y.o. female with a history of coronary artery disease, type 2 diabetes, hypertension, hypothyroidism, and tobacco abuse, who presents for a general follow-up appointment.  Please see the assessment and plan for the status of the patient's chronic medical problems.  Past Medical History  Diagnosis Date  . Carpal tunnel syndrome on both sides     right hand surgery 1998, left hand surgery 1999.  . Allergic rhinitis   . Asthma   . Depression   . GERD (gastroesophageal reflux disease)   . Hyperlipidemia   . Hypothyroidism   . Psoriasis   . CAD (coronary artery disease)     cath 12/13/10: mRCA 99%, EF 65-70%;  s/p BMS to mRCA  . HTN (hypertension)   . DM2 (diabetes mellitus, type 2) ([place])   . Hyperlipidemia   . MI (myocardial infarction) ([place])   . Cellulitis, leg    Current Outpatient Prescriptions  Medication Sig Dispense Refill  . acetaminophen (TYLENOL) 325 MG tablet Take 650 mg by mouth every 4 (four) hours as needed for mild pain.    Marland Kitchen albuterol (PROVENTIL HFA;VENTOLIN HFA) 108 (90 BASE) MCG/ACT inhaler Inhale 2 puffs into the lungs every 4 (four) hours as needed for wheezing or shortness of breath. Dispense with aerochamber 1 Inhaler 0  . aspirin 81 MG chewable tablet Chew 81 mg by mouth daily.    Marland Kitchen atorvastatin (LIPITOR) 80 MG tablet Take 1 tablet (80 mg total) by mouth daily. 90 tablet 3  . Blood Glucose Monitoring Suppl DEVI 1 Device by Does not apply route daily before breakfast. 1 each 0  . Fluticasone-Salmeterol (ADVAIR) 100-50 MCG/DOSE AEPB Inhale 2 puffs into the lungs 2 (two) times daily.    . Glucose Blood (BLOOD GLUCOSE TEST STRIPS) STRP 1 strip by In Vitro route daily before breakfast. 100 each 0  . guaiFENesin-codeine (CHERATUSSIN AC)  100-10 MG/5ML syrup Take 5 mLs by mouth 4 (four) times daily as needed for cough or congestion. 120 mL 0  . ipratropium (ATROVENT) 0.06 % nasal spray Place 2 sprays into both nostrils 4 (four) times daily. 15 mL 0  . Lancets MISC 1 Units by Does not apply route daily. 50 each 0  . levothyroxine (SYNTHROID, LEVOTHROID) 150 MCG tablet Take 150 mcg by mouth daily before breakfast. Take with Synthroid 54m    . levothyroxine (SYNTHROID, LEVOTHROID) 25 MCG tablet Take 25 mcg by mouth daily before breakfast.    . lisinopril (PRINIVIL,ZESTRIL) 5 MG tablet Take 1 tablet (5 mg total) by mouth daily. 30 tablet 11  . loratadine (CLARITIN) 10 MG tablet Take 1 tablet (10 mg total) by mouth daily. 30 tablet 11  . metFORMIN (GLUCOPHAGE) 1000 MG tablet TAKE 1 TABLET (1,000 MG TOTAL) BY MOUTH 2 (TWO) TIMES DAILY. 60 tablet 11  . metoprolol succinate (TOPROL-XL) 100 MG 24 hr tablet Take 1 tablet (100 mg total) by mouth daily. Take with or immediately following a meal. 30 tablet 5  . nitroGLYCERIN (NITROSTAT) 0.4 MG SL tablet Place 1 tablet (0.4 mg total) under the tongue every 5 (five) minutes as needed for chest pain. 25 tablet 3  . Omega-3 Fatty Acids (FISH OIL) 1000 MG CAPS Take 1 capsule by mouth daily.    .Marland Kitchen  omeprazole (PRILOSEC) 40 MG capsule TAKE 1 CAPSULE BY MOUTH DAILY. 30 capsule 2  . rOPINIRole (REQUIP) 1 MG tablet TAKE 1 TABLET (1 MG TOTAL) BY MOUTH AT BEDTIME. 30 tablet 4  . sertraline (ZOLOFT) 100 MG tablet Take 1.5 tablets (150 mg total) by mouth daily. 90 tablet 3  . Spacer/Aero-Holding Chambers (AEROCHAMBER PLUS) inhaler Use as instructed 1 each 2  . [DISCONTINUED] glipiZIDE (GLUCOTROL) 5 MG tablet TAKE 1 TABLET BY MOUTH DAILY BEFORE BREAKFAST (Patient not taking: Reported on 11/20/2014) 30 tablet 12   No current facility-administered medications for this visit.   Family History  Problem Relation Age of Onset  . Diabetes Mother   . Hypertension Mother   . Diabetes Father   . Hypertension Father     Social History   Social History  . Marital Status: Married    Spouse Name: N/A  . Number of Children: 0  . Years of Education: 10th grade   Occupational History  . Conservation officer, nature     self-employed  . 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 Smoking status: Former Smoker -- 0.50 packs/day for 40 years    Types: Cigarettes    Quit date: 05/19/2014  . Smokeless tobacco: Current User     Comment: Trying to cut back.,   . Alcohol Use: No  . Drug Use: No  . Sexual Activity: Not on file   Other Topics Concern  . Not on file   Social History Narrative   Father died of suicide (gunshot) in 5. 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       Patient lives with her husband, her husband's niece Tammy who is mentally retarded and her best friend and the friend's daughter and 6 dogs               Review of Systems  Constitutional: Negative for fever, chills, weight loss and malaise/fatigue.  HENT: Negative for congestion.   Respiratory: Positive for cough. Negative for shortness of breath and wheezing.   Cardiovascular: Negative for chest pain, palpitations and claudication.  Genitourinary: Positive for urgency. Negative for dysuria, frequency, hematuria and flank pain.  Musculoskeletal: Negative for myalgias.  Skin: Negative for itching and rash.       Skin-colored pedunculated papule on lower back she wants removed  Neurological: Negative for dizziness, sensory change, loss of consciousness and headaches.  Psychiatric/Behavioral: Negative for depression.    Objective:  Physical Exam: Filed Vitals:   02/26/15 1345  BP: 137/80  Pulse: 72  Temp: 98.2 F (36.8 C)  TempSrc: Oral  Height: 5' 3"  (1.6 m)  Weight: 261 lb 1.6 oz (118.434 kg)  SpO2: 97%   General: resting in chair comfortably, very nice and conversational HEENT: no scleral icterus, extra-ocular  muscles intact, oropharynx without lesions Cardiac: regular rate and rhythm, no rubs, murmurs or gallops Pulm: breathing well, clear to auscultation bilaterally Abd: bowel sounds normal, soft, nondistended, non-tender Ext: on her right 1st digit and left 3rd digits, there are nodules on the DIPs, otherwise warm and well perfused, without pedal edema Lymph: no cervical or supraclavicular lymphadenopathy Skin: 1. skin-colored pedunculated papule on her mid-lower back 2. subtle scaly plaques in bilateral conchal bowls 3. 3x23m flesh-colored papule with subtle telangiectasis on her right zygoma 4. no nail pitting or oil spots Neuro: alert and  oriented X3, cranial nerves II-XII grossly intact, moving all extremities well  Assessment & Plan:  Case discussed with Dr. Daryll Drown  Distal interphalangeal nodule She has multiple DIP nodules, with some scaly plaques in her conchal bowls, concerning for psoriatic arthritis. She tells me her grandmother had very similarly appearing bumps on her fingers. She doesn't have any other objective evidence of arthritis. I got hand x-rays and an ESR today. If erosive arthritis is noted, I'd like to refer her to rheumatology to consider starting DMARD therapy.  Routine health maintenance I provided her stool cards, and referred her for a mammogram today. Since I was drawing blood to check her TSH anyway, I also checked a hepatitis C antibody.  Diabetes mellitus type 2 with complications Her F6C today was 7.6. She is currently on metformin 1000 mg twice daily. She was on glipizide 5 mg daily, but had symptomatic hypoglycemia. Because she is overweight, and she failed sulfonylurea therapy, I started her on Victoza today. However, I'm concerned her insurance may not cover this; if it's too expensive, I asked her to call me back and  I'll start glipizide 2.5 mg daily.  Chronic cough She's had a chronic nonproductive cough for the last 6 months. She thinks this may have  gotten worse when she restarted lisinopril. She is still smoking 5 cigarettes per day. She tells me she wheezes sometimes, but has never had pulmonary function tests. Last month, she started taking omeprazole daily empirically for gastroesophageal reflux disease. Today, I switched her lisinopril to valsartan. At the next visit, I like to discuss getting pulmonary function tests, with a methacholine challenge, to evaluate whether this is asthma-induced cough, or simply COPD.  Acrochordon She had a flesh-colored pedunculated papule on her mid lower back, which looked to be an acrochordon. I snipped this off for her, and she was very happy to get rid of it.  Hypothyroidism She tells me her skin has been more dry, and she's been more tired recently. She wanted to get her TSH checked, so I checked it today, as it had been over a year since it was last checked.    Medications Ordered Meds ordered this encounter  Medications  . valsartan (DIOVAN) 40 MG tablet    Sig: Take 1 tablet (40 mg total) by mouth daily.    Dispense:  30 tablet    Refill:  11  . Liraglutide 18 MG/3ML SOPN    Sig: Inject 0.6m into the skin once daily for one week, then increase to 1.274monce daily thereafter    Dispense:  6 mL    Refill:  3  . Insulin Pen Needle (BD PEN NEEDLE NANO U/F) 32G X 4 MM MISC    Sig: 1 Units/day by Does not apply route daily.    Dispense:  90 each    Refill:  3   Other Orders Orders Placed This Encounter  Procedures  . MM Digital Screening    Standing Status: Future     Number of Occurrences:      Standing Expiration Date: 04/28/2016    Order Specific Question:  Reason for Exam (SYMPTOM  OR DIAGNOSIS REQUIRED)    Answer:  screening    Order Specific Question:  Is the patient pregnant?    Answer:  No    Order Specific Question:  [facility]?    [facility]    Standing Status: Future     Number of Occurrences:  Standing  Expiration Date: 04/28/2016    Order Specific Question:  Reason for Exam (SYMPTOM  OR DIAGNOSIS REQUIRED)    Answer:  DIP nodules, questioning psoriatic arthritis    Order Specific Question:  Is the patient pregnant?    Answer:  No    Order Specific Question:  Preferred imaging location?    Answer:  [facility]  . DG Hand Complete Left    Standing Status: Future     Number of Occurrences:      Standing Expiration Date: 04/28/2016    Order Specific Question:  Reason for Exam (SYMPTOM  OR DIAGNOSIS REQUIRED)    Answer:  DIP nodules, questioning psoriatic arthritis    Order Specific Question:  Is the patient pregnant?    Answer:  No    Order Specific Question:  Preferred imaging location?    Answer:  [facility]  . Glucose, capillary  . Lipid Profile  . TSH  . Sed Rate (ESR)  . Hepatitis C antibody  . POC Hbg A1C  . POC Hemoccult Bld/Stl (3-Cd Home Screen)    Standing Status: Future     Number of Occurrences:      Standing Expiration Date: 02/26/2016  . POCT Urinalysis Dipstick (81002)   Follow Up: Return in about 3 months (around 05/27/2015).

## 2015-02-26 NOTE — Patient Instructions (Signed)
Kendra Adams,  It was great to see you again today. Quitting smoking is the best thing you do to get rid of this cough. Additionally, I have stopped your lisinopril and recommend taking valsartan 40 mg daily instead to keep your blood pressure down.  I've also put an order to get your hands x-rayed to look at those nodules at the anterior fingers.  We've also checked a thyroid level,  cholesterol level, and I'm screening for a virus called hepatitis C, which we do in all of our patients.  I've started you on Victoza. This is an injectable diabetes medication that will also help you lose weight. If this is too expensive, give me a call, and we can restart the glipizide 2.5 mg daily.  Take care, have a Merry Christmas, and I'll see you in 3 months, Dr. Melburn Hake

## 2015-02-26 NOTE — Assessment & Plan Note (Addendum)
She's had a chronic nonproductive cough for the last 6 months. She thinks this may have gotten worse when she restarted lisinopril. She is still smoking 5 cigarettes per day. She tells me she wheezes sometimes, but has never had pulmonary function tests. Last month, she started taking omeprazole daily empirically for gastroesophageal reflux disease. Today, I switched her lisinopril to valsartan. At the next visit, I like to discuss getting pulmonary function tests, with a methacholine challenge, to evaluate whether this is asthma-induced cough, or simply COPD.

## 2015-02-26 NOTE — Assessment & Plan Note (Signed)
She tells me her skin has been more dry, and she's been more tired recently. She wanted to get her TSH checked, so I checked it today, as it had been over a year since it was last checked.

## 2015-02-27 ENCOUNTER — Other Ambulatory Visit: Payer: Self-pay | Admitting: Internal Medicine

## 2015-02-27 ENCOUNTER — Telehealth: Payer: Self-pay | Admitting: Internal Medicine

## 2015-02-27 DIAGNOSIS — E039 Hypothyroidism, unspecified: Secondary | ICD-10-CM

## 2015-02-27 LAB — LIPID PANEL
Chol/HDL Ratio: 6.5 ratio units — ABNORMAL HIGH (ref 0.0–4.4)
Cholesterol, Total: 203 mg/dL — ABNORMAL HIGH (ref 100–199)
HDL: 31 mg/dL — ABNORMAL LOW (ref >39)
LDL Calculated: 132 mg/dL — ABNORMAL HIGH (ref 0–99)
Triglycerides: 198 mg/dL — ABNORMAL HIGH (ref 0–149)
VLDL Cholesterol Cal: 40 mg/dL (ref 5–40)

## 2015-02-27 LAB — SEDIMENTATION RATE: Sed Rate: 12 mm/hr (ref 0–40)

## 2015-02-27 LAB — TSH: TSH: 7.11 u[IU]/mL — ABNORMAL HIGH (ref 0.450–4.500)

## 2015-02-27 LAB — HEPATITIS C ANTIBODY: Hep C Virus Ab: <0.1 s/co ratio (ref 0.0–0.9)

## 2015-02-27 NOTE — Progress Notes (Signed)
Internal Medicine Clinic Attending  I saw and evaluated the patient.  I personally confirmed the key portions of the history and exam documented by Dr. Melburn Hake and I reviewed pertinent patient test results.  The assessment, diagnosis, and plan were formulated together and I agree with the documentation in the resident's note.  I was present for removal of skin tag.

## 2015-02-27 NOTE — Telephone Encounter (Signed)
Kendra Adams will you see if you can help Kendra Adams

## 2015-02-27 NOTE — Addendum Note (Signed)
Addended by: Gilles Chiquito B on: 02/27/2015 02:45 PM   Modules accepted: Level of Service

## 2015-02-27 NOTE — Telephone Encounter (Signed)
Pt states Lancet is too expensive. Please call pt back.

## 2015-02-28 ENCOUNTER — Telehealth: Payer: Self-pay

## 2015-02-28 ENCOUNTER — Other Ambulatory Visit: Payer: Self-pay | Admitting: Internal Medicine

## 2015-02-28 DIAGNOSIS — I10 Essential (primary) hypertension: Secondary | ICD-10-CM

## 2015-02-28 DIAGNOSIS — E118 Type 2 diabetes mellitus with unspecified complications: Secondary | ICD-10-CM

## 2015-02-28 MED ORDER — LEVOTHYROXINE SODIUM 25 MCG PO TABS: 12.5000 ug | ORAL_TABLET | Freq: Every day | ORAL | Status: DC

## 2015-02-28 MED ORDER — LOSARTAN POTASSIUM 25 MG PO TABS: 25.0000 mg | ORAL_TABLET | Freq: Every day | ORAL | Status: DC

## 2015-02-28 NOTE — Telephone Encounter (Signed)
Dr. Melburn Hake, I rec'd a request from Kristopher Oppenheim on behalf of patient to switch from valsartan to something less expensive.  They state valsartan is 46.23/month and list losartan as "much less expensive."   Can this be updated?

## 2015-02-28 NOTE — Telephone Encounter (Addendum)
She says she Cannot afford victoza.  Her insurance does not pay for medicine. We talked about some other options for better blood sugar control- glipizide er or more intense  lifestyle modification and their side effects and cost. Patient verbalized understanding to education and enthusiasm for learning about her diabetes, CDE encouraged her to find out if her insurance covers DSME and or MNT for her diabetes and to schedule an appointment . CDE told her that this note would be sent to her doctor and she should hear back from our office.

## 2015-02-28 NOTE — Telephone Encounter (Signed)
Thanks Leigh; I changed valsartan to losartan as the latter is cheaper.

## 2015-03-01 ENCOUNTER — Encounter: Payer: Self-pay | Admitting: Internal Medicine

## 2015-03-11 ENCOUNTER — Other Ambulatory Visit: Payer: Self-pay | Admitting: Internal Medicine

## 2015-04-02 ENCOUNTER — Other Ambulatory Visit: Payer: Self-pay | Admitting: Internal Medicine

## 2015-04-03 ENCOUNTER — Encounter: Payer: Self-pay | Admitting: *Deleted

## 2015-04-03 NOTE — Progress Notes (Signed)
Patient ID: Kendra Adams, female   DOB: [birth year], 55 y.o.   MRN: [id number] Received Hemoccult Cards by mail on 04-03-15 from [name]. Collection dates of specimen greater than 12 days old.  Unable to analyze due to protocol. New cards and instructions mailed to [name] for recollect.  Maryan Rued, [facility] 04-03-2015 [phone]

## 2015-04-04 ENCOUNTER — Other Ambulatory Visit: Payer: Self-pay | Admitting: Internal Medicine

## 2015-04-04 DIAGNOSIS — E118 Type 2 diabetes mellitus with unspecified complications: Secondary | ICD-10-CM

## 2015-04-04 MED ORDER — GLIPIZIDE 5 MG PO TABS: 2.5000 mg | ORAL_TABLET | Freq: Every day | ORAL | Status: DC

## 2015-04-04 NOTE — Progress Notes (Signed)
She was unable to afford the Victoza so I've started glipizide 2.69m daily. I'm starting at a low dose because she's had hypoglycemic episodes on the 525mdaily dose in the past. DoButch Pennyill give her a call and continues to be vital in helping her with lifestyle changes.

## 2015-04-07 ENCOUNTER — Other Ambulatory Visit: Payer: Self-pay | Admitting: Internal Medicine

## 2015-04-13 ENCOUNTER — Other Ambulatory Visit: Payer: Self-pay | Admitting: Internal Medicine

## 2015-04-18 ENCOUNTER — Other Ambulatory Visit: Payer: Self-pay | Admitting: Internal Medicine

## 2015-05-02 ENCOUNTER — Other Ambulatory Visit: Payer: Self-pay | Admitting: Internal Medicine

## 2015-05-02 DIAGNOSIS — Z1231 Encounter for screening mammogram for malignant neoplasm of breast: Secondary | ICD-10-CM

## 2015-05-15 ENCOUNTER — Telehealth: Payer: Self-pay | Admitting: Internal Medicine

## 2015-05-15 NOTE — Telephone Encounter (Signed)
APPT REMINDER CALL, NO ANSWER, MAIL BOX FULL

## 2015-05-16 ENCOUNTER — Ambulatory Visit (INDEPENDENT_AMBULATORY_CARE_PROVIDER_SITE_OTHER): Payer: PRIVATE HEALTH INSURANCE | Admitting: Internal Medicine

## 2015-05-16 ENCOUNTER — Encounter: Payer: Self-pay | Admitting: Internal Medicine

## 2015-05-16 VITALS — BP 118/59 | HR 110 | Temp 98.1°F | Resp 18 | Ht 63.0 in | Wt 258.1 lb

## 2015-05-16 DIAGNOSIS — Z1211 Encounter for screening for malignant neoplasm of colon: Secondary | ICD-10-CM | POA: Diagnosis not present

## 2015-05-16 DIAGNOSIS — E118 Type 2 diabetes mellitus with unspecified complications: Secondary | ICD-10-CM

## 2015-05-16 DIAGNOSIS — E1165 Type 2 diabetes mellitus with hyperglycemia: Secondary | ICD-10-CM

## 2015-05-16 DIAGNOSIS — Z7984 Long term (current) use of oral hypoglycemic drugs: Secondary | ICD-10-CM

## 2015-05-16 DIAGNOSIS — I2510 Atherosclerotic heart disease of native coronary artery without angina pectoris: Secondary | ICD-10-CM | POA: Diagnosis not present

## 2015-05-16 DIAGNOSIS — Z0000 Encounter for general adult medical examination without abnormal findings: Secondary | ICD-10-CM

## 2015-05-16 DIAGNOSIS — R053 Chronic cough: Secondary | ICD-10-CM | POA: Insufficient documentation

## 2015-05-16 DIAGNOSIS — R21 Rash and other nonspecific skin eruption: Secondary | ICD-10-CM | POA: Diagnosis not present

## 2015-05-16 DIAGNOSIS — Z7982 Long term (current) use of aspirin: Secondary | ICD-10-CM

## 2015-05-16 DIAGNOSIS — E038 Other specified hypothyroidism: Secondary | ICD-10-CM

## 2015-05-16 DIAGNOSIS — R05 Cough: Secondary | ICD-10-CM

## 2015-05-16 DIAGNOSIS — E669 Obesity, unspecified: Secondary | ICD-10-CM

## 2015-05-16 DIAGNOSIS — F17200 Nicotine dependence, unspecified, uncomplicated: Secondary | ICD-10-CM

## 2015-05-16 DIAGNOSIS — E039 Hypothyroidism, unspecified: Secondary | ICD-10-CM

## 2015-05-16 DIAGNOSIS — Z79899 Other long term (current) drug therapy: Secondary | ICD-10-CM

## 2015-05-16 LAB — POC HEMOCCULT BLD/STL (HOME/3-CARD/SCREEN)
Card #2 Fecal Occult Blod, POC: NEGATIVE
Card #3 Date: 3082017
Card #3 Fecal Occult Blood, POC: POSITIVE
Fecal Occult Blood, POC: NEGATIVE

## 2015-05-16 MED ORDER — METOPROLOL SUCCINATE ER 100 MG PO TB24: 100.0000 mg | ORAL_TABLET | Freq: Every day | ORAL | Status: DC

## 2015-05-16 MED ORDER — HYDROCORTISONE 2.5 % EX LOTN: TOPICAL_LOTION | CUTANEOUS | Status: DC

## 2015-05-16 MED ORDER — ALBUTEROL SULFATE HFA 108 (90 BASE) MCG/ACT IN AERS: 2.0000 | INHALATION_SPRAY | RESPIRATORY_TRACT | Status: DC | PRN

## 2015-05-16 MED ORDER — NICOTINE 14 MG/24HR TD PT24: 14.0000 mg | MEDICATED_PATCH | TRANSDERMAL | Status: DC

## 2015-05-16 NOTE — Assessment & Plan Note (Signed)
She is currently on levothyroxin 175 g daily. She has been fatigued recently, so we have checked another TSH.

## 2015-05-16 NOTE — Assessment & Plan Note (Addendum)
Her last A1c in December 2016 was  7.6. Since then, I started glipizide 2.5 mg daily in addition to metformin 1 g twice daily. She continues to drink 3-5  Mountain Dew's per day. We talked at length about the importance of quitting this. She is interested in talking to [name] We will check another A1c at her next visit; if she continues to be uncontrolled, as I expect she will, we can try again to see if her insurance will approve Victoza to also assist with weight loss and give her the mortality benefit given her cardiovascular disease. Another option could be an SGLT-2 inhibitor for its CV mortality benefit.

## 2015-05-16 NOTE — Assessment & Plan Note (Signed)
Her last colonoscopy in 2011 showed benign polyp with an additional strange finding of ischemic colitis. I have requested records from [name] office to figure out what that's about. She does not have colitis symptoms. Also, she had a Hemoccult positive this week. Once I get records from [name], [place] like to place a referral for colonoscopy for screening colonoscopy.

## 2015-05-16 NOTE — Assessment & Plan Note (Signed)
Three days ago, she had an itchy red rash erupt on her bilateral summary exposed arms after working in the sun.  On exam she does have excoriated red macules and papules only on the sun exposed areas. 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  I checked an ANA today  And recommended she use hydrocortisone 2.5% cream on her arms until the rash goes away. We can always upgrade strength is steroid to triamcinolone if necessary.  If she continues at this issue, we can check urinary porphyrins to screen for PCT.

## 2015-05-16 NOTE — Progress Notes (Signed)
Patient ID: Kendra Adams, female   DOB: Feb 06, 1961, 55 y.o.   MRN: [id number] [facility] Subjective:   Patient ID: Kendra Adams female   DOB: 11-22-1960 55 y.o.   MRN: [id number]  HPI: [name] is a 55 y.o. female with  Coronary artery disease  Status post bare-metal stent to right circumflex artery in 2012, tobacco use disorder, hypertension, take 2 diabetes, hypothyroidism, hyperlipidemia, and depression presenting for follow-up of type 2 diabetes and evaluation for skin rash on her upper arms.  Type 2 diabetes:  She is not been checking her blood sugars, but has been taking glipizide 2.5 mg daily metformin 1000 mg twice daily as prescribed. She denies any symptomatic hypoglycemia. She is continuing to drink 3-5 days per day. She is interested in talking to [name] for dietary education.  Skin rash on upper arms:  She has a itchy red rash on her arms after working outside in the sun 3 days ago. She denies any joint pains, lesions in her mouth, family history of lupus, dry eyes, or dry mouth.  She was not working  Around any poison ivy, and has not started any new medications recently.  I have reviewed her medications with her today, she continues to smoke half pack per day.  Review of Systems  Constitutional: Negative for fever, chills, weight loss and malaise/fatigue.  Respiratory: Negative for shortness of breath.   Cardiovascular: Positive for chest pain. Negative for palpitations, orthopnea and leg swelling.  Gastrointestinal: Negative for diarrhea and blood in stool.  Skin: Positive for itching and rash.  Psychiatric/Behavioral: Negative for depression.   Objective:  Physical Exam: Filed Vitals:   05/16/15 1558  BP: 118/59  Pulse: 110  Temp: 98.1 F (36.7 C)  TempSrc: Oral  Resp: 18  Height: 5' 3"  (1.6 m)  Weight: 258 lb 1.6 oz (117.073 kg)  SpO2: 95%   General: resting in chair comfortably, appropriately conversational HEENT: no  scleral icterus, extra-ocular muscles intact, oropharynx without lesions Cardiac: regular rate and rhythm, no rubs, murmurs or gallops Pulm: breathing well, clear to auscultation bilaterally Ext: warm and well perfused, without pedal edema Lymph: no cervical or supraclavicular lymphadenopathy Skin: diffuse excoriated erythematous macules and papules on bilateral upper arms   Assessment & Plan:  Case discussed with Dr. Lynnae January  Diabetes mellitus type 2 with complications Her last K9T in December 2016 was  7.6. Since then, I started glipizide 2.5 mg daily in addition to metformin 1 g twice daily. She continues to drink 3-5  Mountain Dew's per day. We talked at length about the importance of quitting this. She is interested in talking to [name] We will check another A1c at her next visit; if she continues to be uncontrolled, as I expect she will, we can try again to see if her insurance will approve Victoza to also assist with weight loss and give her the mortality benefit given her cardiovascular disease. Another option could be an SGLT-2 inhibitor for its CV mortality benefit.  Hypothyroidism She is currently on levothyroxin 175 g daily. She has been fatigued recently, so we have checked another TSH.  Tobacco use disorder  She is currently smoking half pack per day, would like to try nicotine replacement therapy. I prescribed nicotine patches today.  Routine health maintenance Her last colonoscopy in 2011 showed benign polyp with an additional strange finding of ischemic colitis. I have requested records from [name] office  to figure out what that's about. She does not have colitis symptoms. Also, she had a Hemoccult positive this week. Once I get records from [name], [place] like to place a referral for colonoscopy for screening colonoscopy.  Rash and nonspecific skin eruption Three days ago, she had an itchy red rash erupt on her bilateral summary  exposed arms after working in the sun.  On exam she does have excoriated red macules and papules only on the sun exposed areas. 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  I checked an ANA today  And recommended she use hydrocortisone 2.5% cream on her arms until the rash goes away. We can always upgrade strength is steroid to triamcinolone if necessary.  If she continues at this issue, we can check urinary porphyrins to screen for PCT.  CAD (coronary artery disease)  She had a stent placed to her right coronary artery in October 2012. She is now on aspirin, atorvastatin 80 g daily, lisinopril 5 mg daily. She was prescribed metoprolol succinate 100 mg daily, but she had not been taking this for the last few months because she had forgot. I refiled this for her today. She has an appointment with the cardiologist next month.   Medications Ordered Meds ordered this encounter  Medications  . metoprolol succinate (TOPROL-XL) 100 MG 24 hr tablet    Sig: Take 1 tablet (100 mg total) by mouth daily. Take with or immediately following a meal.    Dispense:  30 tablet    Refill:  5    PATIENT NEEDS TO CALL AND SETUP APPT WITH DOCTOR BEFORE ANY FUTURE REFILLS WILL BE SENT. [phone].  Marland Kitchen albuterol (PROVENTIL HFA;VENTOLIN HFA) 108 (90 Base) MCG/ACT inhaler    Sig: Inhale 2 puffs into the lungs every 4 (four) hours as needed for wheezing or shortness of breath. Dispense with aerochamber    Dispense:  1 Inhaler    Refill:  0  . nicotine (NICODERM CQ - DOSED IN MG/24 HOURS) 14 mg/24hr patch    Sig: Place 1 patch (14 mg total) onto the skin daily.    Dispense:  30 patch    Refill:  0  . hydrocortisone 2.5 % lotion    Sig: Apply to itchy arms twice daily    Dispense:  60 mL    Refill:  1   Other Orders Orders Placed This Encounter  Procedures  . TSH  . ANA, IFA (with reflex)  . Amb ref to  Medical Nutrition Therapy-MNT    Referral Priority:  Routine    Referral Type:  Consultation    Referral Reason:  Specialty Services Required    Requested Specialty:  Nutrition    Number of Visits Requested:  1   Follow Up: Return in about 4 weeks (around 06/13/2015).

## 2015-05-16 NOTE — Assessment & Plan Note (Signed)
She had a stent placed to her right coronary artery in October 2012. She is now on aspirin, atorvastatin 80 g daily, lisinopril 5 mg daily. She was prescribed metoprolol succinate 100 mg daily, but she had not been taking this for the last few months because she had forgot. I refiled this for her today. She has an appointment with the cardiologist next month.

## 2015-05-16 NOTE — Patient Instructions (Addendum)
Kendra Adams,  It was great to see you again today!  As we discussed,  I don't think a chest pain is related to your heart.  However, wanted everything possible to prevent any heart disease,  So here is what we will do:  1.  I've refilled your Toprol-XL. Be sure to take this everyday. 2.  I'm checking your thyroid level today. Be sure to take the levothyroxine early in the morning before breakfast. 3.  I have referred you to our dietitian to try to help better control your diabetes and help you lose weight. 4.  Try to do everything possible quit smoking! Quitting will help prevent heart attacks and heart failure. I've sent some nicotine patches for you to try.  Also, you had blood in your stool so we may need to get you back in for a colonoscopy. I'm going to get records from [name] office to figure out what exactly she saw and then we can re-discuss this at your next visit.  We'll see you back in 1 month.  Till then, take care, Dr. Melburn Hake

## 2015-05-16 NOTE — Assessment & Plan Note (Signed)
She is currently smoking half pack per day, would like to try nicotine replacement therapy. I prescribed nicotine patches today.

## 2015-05-17 LAB — ANTINUCLEAR ANTIBODIES, IFA: ANA Titer 1: NEGATIVE

## 2015-05-17 LAB — TSH: TSH: 1.54 u[IU]/mL (ref 0.450–4.500)

## 2015-05-18 NOTE — Addendum Note (Signed)
Addended by: Carlota Raspberry on: 05/18/2015 12:13 PM   Modules accepted: Level of Service

## 2015-05-18 NOTE — Progress Notes (Signed)
Internal Medicine Clinic Attending  Case discussed with Dr. Melburn Hake at the time of the visit.  We reviewed the resident's history and exam and pertinent patient test results.  I agree with the assessment, diagnosis, and plan of care documented in the resident's note.

## 2015-05-28 ENCOUNTER — Ambulatory Visit
Admission: RE | Admit: 2015-05-28 | Discharge: 2015-05-28 | Disposition: A | Discharge status: Home / Self Care | Payer: PRIVATE HEALTH INSURANCE | Source: Ambulatory Visit | Attending: Internal Medicine | Admitting: Internal Medicine

## 2015-05-28 DIAGNOSIS — Z1231 Encounter for screening mammogram for malignant neoplasm of breast: Secondary | ICD-10-CM

## 2015-05-29 ENCOUNTER — Encounter: Payer: PRIVATE HEALTH INSURANCE | Admitting: Dietician

## 2015-06-15 ENCOUNTER — Telehealth: Payer: Self-pay | Admitting: Internal Medicine

## 2015-06-15 NOTE — Telephone Encounter (Signed)
APPT. REMINDER CALL, LMTCB °

## 2015-06-18 ENCOUNTER — Encounter: Payer: PRIVATE HEALTH INSURANCE | Admitting: Internal Medicine

## 2015-06-18 ENCOUNTER — Encounter: Payer: Self-pay | Admitting: Internal Medicine

## 2015-06-18 ENCOUNTER — Encounter: Payer: PRIVATE HEALTH INSURANCE | Admitting: Dietician

## 2015-07-02 NOTE — Addendum Note (Signed)
Addended by: Hulan Fray on: 07/02/2015 06:01 PM   Modules accepted: Orders

## 2015-07-05 ENCOUNTER — Ambulatory Visit (INDEPENDENT_AMBULATORY_CARE_PROVIDER_SITE_OTHER): Payer: Self-pay | Admitting: Internal Medicine

## 2015-07-05 ENCOUNTER — Encounter: Payer: Self-pay | Admitting: Internal Medicine

## 2015-07-05 VITALS — Temp 98.1°F | Ht 63.0 in | Wt 256.9 lb

## 2015-07-05 DIAGNOSIS — W228XXA Striking against or struck by other objects, initial encounter: Secondary | ICD-10-CM

## 2015-07-05 DIAGNOSIS — B351 Tinea unguium: Secondary | ICD-10-CM

## 2015-07-05 DIAGNOSIS — F17210 Nicotine dependence, cigarettes, uncomplicated: Secondary | ICD-10-CM

## 2015-07-05 DIAGNOSIS — S99922A Unspecified injury of left foot, initial encounter: Secondary | ICD-10-CM | POA: Insufficient documentation

## 2015-07-05 DIAGNOSIS — S90122A Contusion of left lesser toe(s) without damage to nail, initial encounter: Secondary | ICD-10-CM

## 2015-07-05 DIAGNOSIS — F17200 Nicotine dependence, unspecified, uncomplicated: Secondary | ICD-10-CM

## 2015-07-05 NOTE — Progress Notes (Signed)
Patient ID: Kendra Adams, female   DOB: 09-Mar-1961, 55 y.o.   MRN: [id number]     Subjective:   Patient ID: Kendra Adams female    DOB: 06/29/1960 55 y.o.    MRN: [id number] Health Maintenance Due: Health Maintenance Due  Topic Date Due  . OPHTHALMOLOGY EXAM  02/11/2015  . FOOT EXAM  05/23/2015  . HEMOGLOBIN A1C  05/27/2015    _________________________________________________  HPI: Kendra Adams is a 55 y.o. female here for an acute visit.  Pt has a PMH outlined below.  Please see problem-based charting assessment and plan for further status of patient's chronic medical problems addressed at today's visit.  PMH: Past Medical History  Diagnosis Date  . Allergic rhinitis   . Tobacco abuse   . Depression   . GERD (gastroesophageal reflux disease)   . Hyperlipidemia   . Hypothyroidism   . CAD (coronary artery disease)     cath 12/13/10: mRCA 99%, EF 65-70%;  s/p BMS to mRCA  . HTN (hypertension)   . DM2 (diabetes mellitus, type 2) ([place])   . Hyperlipidemia   . MI (myocardial infarction) ([place])     Medications: Current Outpatient Prescriptions on File Prior to Visit  Medication Sig Dispense Refill  . acetaminophen (TYLENOL) 325 MG tablet Take 650 mg by mouth every 4 (four) hours as needed for mild pain.    Marland Kitchen albuterol (PROVENTIL HFA;VENTOLIN HFA) 108 (90 Base) MCG/ACT inhaler Inhale 2 puffs into the lungs every 4 (four) hours as needed for wheezing or shortness of breath. Dispense with aerochamber 1 Inhaler 0  . aspirin 81 MG chewable tablet Chew 81 mg by mouth daily.    Marland Kitchen atorvastatin (LIPITOR) 80 MG tablet Take 1 tablet (80 mg total) by mouth daily. 90 tablet 3  . Blood Glucose Monitoring Suppl DEVI 1 Device by Does not apply route daily before breakfast. 1 each 0  . Fluticasone-Salmeterol (ADVAIR) 100-50 MCG/DOSE AEPB Inhale 2 puffs into the lungs 2 (two) times daily.    Marland Kitchen glipiZIDE (GLUCOTROL) 5 MG tablet Take 0.5 tablets (2.5 mg total) by mouth daily. 90 tablet 4  .  Glucose Blood (BLOOD GLUCOSE TEST STRIPS) STRP 1 strip by In Vitro route daily before breakfast. 100 each 0  . guaiFENesin-codeine (CHERATUSSIN AC) 100-10 MG/5ML syrup Take 5 mLs by mouth 4 (four) times daily as needed for cough or congestion. 120 mL 0  . hydrocortisone 2.5 % lotion Apply to itchy arms twice daily 60 mL 1  . Insulin Pen Needle (BD PEN NEEDLE NANO U/F) 32G X 4 MM MISC 1 Units/day by Does not apply route daily. 90 each 3  . ipratropium (ATROVENT) 0.06 % nasal spray Place 2 sprays into both nostrils 4 (four) times daily. 15 mL 0  . Lancets MISC 1 Units by Does not apply route daily. 50 each 0  . levothyroxine (LEVOTHROID) 25 MCG tablet Take 0.5 tablets (12.5 mcg total) by mouth daily. In addition to the 163mg tablet (total 162.554m daily) 30 tablet 11  . levothyroxine (SYNTHROID, LEVOTHROID) 150 MCG tablet TAKE 1 TABLET BY MOUTH DAILY BEFORE BREAKFAST. 90 tablet 3  . loratadine (CLARITIN) 10 MG tablet Take 1 tablet (10 mg total) by mouth daily. 30 tablet 11  . losartan (COZAAR) 25 MG tablet Take 1 tablet (25 mg total) by mouth daily. 30 tablet 11  . metFORMIN (GLUCOPHAGE) 1000 MG tablet TAKE 1 TABLET (1,000 MG TOTAL) BY MOUTH 2 (TWO) TIMES DAILY. 60 tablet 11  . metoprolol  succinate (TOPROL-XL) 100 MG 24 hr tablet Take 1 tablet (100 mg total) by mouth daily. Take with or immediately following a meal. 30 tablet 5  . nicotine (NICODERM CQ - DOSED IN MG/24 HOURS) 14 mg/24hr patch Place 1 patch (14 mg total) onto the skin daily. 30 patch 0  . nitroGLYCERIN (NITROSTAT) 0.4 MG SL tablet Place 1 tablet (0.4 mg total) under the tongue every 5 (five) minutes as needed for chest pain. 25 tablet 3  . Omega-3 Fatty Acids (FISH OIL) 1000 MG CAPS Take 1 capsule by mouth daily.    Marland Kitchen omeprazole (PRILOSEC) 40 MG capsule TAKE 1 CAPSULE BY MOUTH DAILY. 90 capsule 1  . rOPINIRole (REQUIP) 1 MG tablet TAKE 1 TABLET (1 MG TOTAL) BY MOUTH AT BEDTIME. 30 tablet 4  . sertraline (ZOLOFT) 100 MG tablet Take 1.5  tablets (150 mg total) by mouth daily. 90 tablet 3  . Spacer/Aero-Holding Chambers (AEROCHAMBER PLUS) inhaler Use as instructed 1 each 2   No current facility-administered medications on file prior to visit.    Allergies: Allergies  Allergen Reactions  . Gabapentin     Skin rash, lip swelling  . Nsaids     IBU - Rectal bleeds; states ASA & Tylenol OK  . Tramadol Hcl     "Just doesn't do anything for me"    FH: Family History  Problem Relation Age of Onset  . Diabetes Mother   . Hypertension Mother   . Diabetes Father   . Hypertension Father     SH: Social History   Social History  . Marital Status: Married    Spouse Name: N/A  . Number of Children: 0  . Years of Education: 10th grade   Occupational History  . Conservation officer, nature     self-employed  . 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 Smoking status: Former Smoker -- 0.50 packs/day for 40 years    Types: Cigarettes    Quit date: 05/19/2014  . Smokeless tobacco: Current User     Comment: Trying to cut back.  . Alcohol Use: No  . Drug Use: No  . Sexual Activity: Not on file   Other Topics Concern  . Not on file   Social History Narrative   Father died of suicide (gunshot) in 59. 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       Patient lives with her husband, her husband's niece Tammy who is mentally retarded and her best friend and the friend's daughter and 6 dogs                Review of Systems: Constitutional: Negative for fever, chills.  Eyes: Negative for blurred vision.  Respiratory: Negative for cough and shortness of breath.  Cardiovascular: Negative for chest pain.  Gastrointestinal: Negative for nausea, vomiting. Neurological: Negative for dizziness.   Objective:   Vital Signs: Filed Vitals:   07/05/15 1409  Temp: 98.1 F (36.7 C)  TempSrc: Oral  Height: 5' 3"  (1.6 m)    Weight: 256 lb 14.4 oz (116.529 kg)      BP Readings from Last 3 Encounters:  05/16/15 118/59  02/26/15 137/80  02/13/15 110/64    Physical Exam: Constitutional: Vital signs reviewed.  Patient is in NAD and cooperative with exam.  Head: Normocephalic and atraumatic. Eyes: EOMI, conjunctivae nl, no scleral  icterus.  Neck: Supple. Cardiovascular: RRR, no MRG. Pulmonary/Chest: normal effort, CTAB, no wheezes, rales, or rhonchi. Abdominal: Soft. NT/ND +BS. Musculoskeletal: 2nd left toe with normal ROM, 2+DP, sensation intact, minor bruising with point tenderness to palpation.    Neurological: A&O x3, cranial nerves II-XII are grossly intact, moving all extremities. Extremities: No LE edema, onychomycosis b/l.  Skin: Warm, dry and intact. No rash.   Assessment & Plan:   Assessment and plan was discussed and formulated with my attending.

## 2015-07-05 NOTE — Patient Instructions (Signed)
Thank you for your visit today.   Please return to the internal medicine clinic in about 3 weeks.   Please let us know if you develop worsening symptoms of toe pain. I will refer you to a podiatrist for your toenail care.   Please continue your current medications and f/u with Dr. Melburn Hake.   Please be sure to bring all of your medications with you to every visit; this includes herbal supplements, vitamins, eye drops, and any over-the-counter medications.   Should you have any questions regarding your medications and/or any new or worsening symptoms, please be sure to call the clinic at [phone].   If you believe that you are suffering from a life threatening condition or one that may result in the loss of limb or function, then you should call 911 and proceed to the nearest Emergency Department.   A healthy lifestyle and preventative care can promote health and wellness.   Maintain regular health, dental, and eye exams.  Eat a healthy diet. Foods like vegetables, fruits, whole grains, low-fat dairy products, and lean protein foods contain the nutrients you need without too many calories. Decrease your intake of foods high in solid fats, added sugars, and salt. Get information about a proper diet from your caregiver, if necessary.  Regular physical exercise is one of the most important things you can do for your health. Most adults should get at least 150 minutes of moderate-intensity exercise (any activity that increases your heart rate and causes you to sweat) each week. In addition, most adults need muscle-strengthening exercises on 2 or more days a week.   Maintain a healthy weight. The body mass index (BMI) is a screening tool to identify possible weight problems. It provides an estimate of body fat based on height and weight. Your caregiver can help determine your BMI, and can help you achieve or maintain a healthy weight. For adults 20 years and older:  A BMI below 18.5 is  considered underweight.  A BMI of 18.5 to 24.9 is normal.  A BMI of 25 to 29.9 is considered overweight.  A BMI of 30 and above is considered obese.

## 2015-07-05 NOTE — Assessment & Plan Note (Addendum)
She is still smoking about 10 cig/day and we discussed cessation.  Discussed diabetes and smoking and the risks of PAD.  She reports getting off of cigarettes with chantix but started back.  She also smokes THC on occasion.   -encouraged cessation and offered help but she does not seem interested in quitting now -we discussed THC cessation also

## 2015-07-05 NOTE — Assessment & Plan Note (Signed)
Pt reports stumping her 2nd left toe last week on some concrete and was concerned regarding infection.  She has no signs of infection of the toe.  She has some point tenderness with minor bruising.  Neurovascularly intact. She is able to bear weight on the foot.  However, she does have significant onychomycosis b/l.  She has been clipping her on toenails and I advised her against this in the context of diabetes. -referral to podiatry -conservative mgmt

## 2015-07-06 ENCOUNTER — Encounter: Payer: Self-pay | Admitting: Internal Medicine

## 2015-07-09 ENCOUNTER — Telehealth: Payer: Self-pay

## 2015-07-09 DIAGNOSIS — R195 Other fecal abnormalities: Secondary | ICD-10-CM

## 2015-07-09 NOTE — Telephone Encounter (Signed)
Pt would like to be scheduled for a colonoscopy.  She says her stomach ulcer is getting worse and although she doesn't have insurance she is willing to discuss this with the GI doctor.  Can you put in the referral?

## 2015-07-09 NOTE — Addendum Note (Signed)
Addended by: Oval Linsey D on: 07/09/2015 12:04 AM   Modules accepted: Level of Service

## 2015-07-09 NOTE — Progress Notes (Signed)
Case discussed with Dr. Gordy Levan soon after the resident saw the patient.  We reviewed the resident's history and exam and pertinent patient test results.  I agree with the assessment, diagnosis, and plan of care documented in the resident's note.

## 2015-07-10 DIAGNOSIS — R195 Other fecal abnormalities: Secondary | ICD-10-CM | POA: Insufficient documentation

## 2015-07-10 NOTE — Telephone Encounter (Signed)
Thanks Leigh. I've placed the referral.

## 2015-08-09 ENCOUNTER — Telehealth: Payer: Self-pay | Admitting: Dietician

## 2015-08-09 NOTE — Telephone Encounter (Signed)
Calling patient to follow up on toe ulcer and blood sugars. Left message

## 2015-08-12 ENCOUNTER — Encounter (HOSPITAL_COMMUNITY): Payer: Self-pay | Admitting: Emergency Medicine

## 2015-08-12 ENCOUNTER — Observation Stay (HOSPITAL_COMMUNITY): Payer: Self-pay

## 2015-08-12 ENCOUNTER — Inpatient Hospital Stay (HOSPITAL_COMMUNITY)
Admission: EM | Admit: 2015-08-12 | Discharge: 2015-08-16 | DRG: 418 | Disposition: A | Discharge status: Home / Self Care | Payer: Self-pay | Attending: Oncology | Admitting: Oncology

## 2015-08-12 ENCOUNTER — Emergency Department (HOSPITAL_COMMUNITY): Payer: Self-pay

## 2015-08-12 DIAGNOSIS — I152 Hypertension secondary to endocrine disorders: Secondary | ICD-10-CM | POA: Diagnosis present

## 2015-08-12 DIAGNOSIS — R7401 Elevation of levels of liver transaminase levels: Secondary | ICD-10-CM | POA: Diagnosis present

## 2015-08-12 DIAGNOSIS — F32A Depression, unspecified: Secondary | ICD-10-CM | POA: Diagnosis present

## 2015-08-12 DIAGNOSIS — R740 Nonspecific elevation of levels of transaminase and lactic acid dehydrogenase [LDH]: Secondary | ICD-10-CM

## 2015-08-12 DIAGNOSIS — K8012 Calculus of gallbladder with acute and chronic cholecystitis without obstruction: Principal | ICD-10-CM | POA: Diagnosis present

## 2015-08-12 DIAGNOSIS — I10 Essential (primary) hypertension: Secondary | ICD-10-CM | POA: Diagnosis present

## 2015-08-12 DIAGNOSIS — I25118 Atherosclerotic heart disease of native coronary artery with other forms of angina pectoris: Secondary | ICD-10-CM

## 2015-08-12 DIAGNOSIS — Z7984 Long term (current) use of oral hypoglycemic drugs: Secondary | ICD-10-CM

## 2015-08-12 DIAGNOSIS — I252 Old myocardial infarction: Secondary | ICD-10-CM

## 2015-08-12 DIAGNOSIS — E785 Hyperlipidemia, unspecified: Secondary | ICD-10-CM | POA: Diagnosis present

## 2015-08-12 DIAGNOSIS — F329 Major depressive disorder, single episode, unspecified: Secondary | ICD-10-CM | POA: Diagnosis present

## 2015-08-12 DIAGNOSIS — E669 Obesity, unspecified: Secondary | ICD-10-CM

## 2015-08-12 DIAGNOSIS — E1169 Type 2 diabetes mellitus with other specified complication: Secondary | ICD-10-CM | POA: Diagnosis present

## 2015-08-12 DIAGNOSIS — R079 Chest pain, unspecified: Secondary | ICD-10-CM

## 2015-08-12 DIAGNOSIS — Z87891 Personal history of nicotine dependence: Secondary | ICD-10-CM

## 2015-08-12 DIAGNOSIS — R1031 Right lower quadrant pain: Secondary | ICD-10-CM

## 2015-08-12 DIAGNOSIS — Z955 Presence of coronary angioplasty implant and graft: Secondary | ICD-10-CM

## 2015-08-12 DIAGNOSIS — E118 Type 2 diabetes mellitus with unspecified complications: Secondary | ICD-10-CM | POA: Diagnosis present

## 2015-08-12 DIAGNOSIS — J309 Allergic rhinitis, unspecified: Secondary | ICD-10-CM | POA: Diagnosis present

## 2015-08-12 DIAGNOSIS — I52 Other heart disorders in diseases classified elsewhere: Secondary | ICD-10-CM | POA: Diagnosis present

## 2015-08-12 DIAGNOSIS — I2510 Atherosclerotic heart disease of native coronary artery without angina pectoris: Secondary | ICD-10-CM | POA: Diagnosis present

## 2015-08-12 DIAGNOSIS — R1013 Epigastric pain: Secondary | ICD-10-CM

## 2015-08-12 DIAGNOSIS — Z9049 Acquired absence of other specified parts of digestive tract: Secondary | ICD-10-CM | POA: Diagnosis present

## 2015-08-12 DIAGNOSIS — K8050 Calculus of bile duct without cholangitis or cholecystitis without obstruction: Secondary | ICD-10-CM

## 2015-08-12 DIAGNOSIS — E039 Hypothyroidism, unspecified: Secondary | ICD-10-CM | POA: Diagnosis present

## 2015-08-12 DIAGNOSIS — Z6841 Body Mass Index (BMI) 40.0 and over, adult: Secondary | ICD-10-CM

## 2015-08-12 DIAGNOSIS — Z7982 Long term (current) use of aspirin: Secondary | ICD-10-CM

## 2015-08-12 DIAGNOSIS — E1159 Type 2 diabetes mellitus with other circulatory complications: Secondary | ICD-10-CM | POA: Diagnosis present

## 2015-08-12 DIAGNOSIS — F17200 Nicotine dependence, unspecified, uncomplicated: Secondary | ICD-10-CM | POA: Diagnosis present

## 2015-08-12 DIAGNOSIS — R1011 Right upper quadrant pain: Secondary | ICD-10-CM

## 2015-08-12 DIAGNOSIS — E119 Type 2 diabetes mellitus without complications: Secondary | ICD-10-CM

## 2015-08-12 DIAGNOSIS — K219 Gastro-esophageal reflux disease without esophagitis: Secondary | ICD-10-CM | POA: Diagnosis present

## 2015-08-12 LAB — COMPREHENSIVE METABOLIC PANEL
ALT: 79 U/L — ABNORMAL HIGH (ref 14–54)
ALT: 92 U/L — ABNORMAL HIGH (ref 14–54)
AST: 177 U/L — ABNORMAL HIGH (ref 15–41)
AST: 159 U/L — ABNORMAL HIGH (ref 15–41)
Albumin: 3.3 g/dL — ABNORMAL LOW (ref 3.5–5.0)
Albumin: 3.3 g/dL — ABNORMAL LOW (ref 3.5–5.0)
Alkaline Phosphatase: 290 U/L — ABNORMAL HIGH (ref 38–126)
Alkaline Phosphatase: 286 U/L — ABNORMAL HIGH (ref 38–126)
Anion gap: 9 (ref 5–15)
Anion gap: 11 (ref 5–15)
BUN: 10 mg/dL (ref 6–20)
BUN: 9 mg/dL (ref 6–20)
CO2: 26 mmol/L (ref 22–32)
CO2: 26 mmol/L (ref 22–32)
Calcium: 9.4 mg/dL (ref 8.9–10.3)
Calcium: 9.5 mg/dL (ref 8.9–10.3)
Chloride: 104 mmol/L (ref 101–111)
Chloride: 103 mmol/L (ref 101–111)
Creatinine, Ser: 0.8 mg/dL (ref 0.44–1.00)
Creatinine, Ser: 0.76 mg/dL (ref 0.44–1.00)
GFR calc Af Amer: >60 mL/min (ref >60)
GFR calc Af Amer: >60 mL/min (ref >60)
GFR calc non Af Amer: >60 mL/min (ref >60)
GFR calc non Af Amer: >60 mL/min (ref >60)
Glucose, Bld: 185 mg/dL — ABNORMAL HIGH (ref 65–99)
Glucose, Bld: 142 mg/dL — ABNORMAL HIGH (ref 65–99)
Potassium: 3.9 mmol/L (ref 3.5–5.1)
Potassium: 3.7 mmol/L (ref 3.5–5.1)
Sodium: 139 mmol/L (ref 135–145)
Sodium: 140 mmol/L (ref 135–145)
Total Bilirubin: 0.7 mg/dL (ref 0.3–1.2)
Total Bilirubin: 0.6 mg/dL (ref 0.3–1.2)
Total Protein: 6.7 g/dL (ref 6.5–8.1)
Total Protein: 6.6 g/dL (ref 6.5–8.1)

## 2015-08-12 LAB — CBC WITH DIFFERENTIAL/PLATELET
Basophils Absolute: 0 10*3/uL (ref 0.0–0.1)
Basophils Relative: 0 %
Eosinophils Absolute: 0.3 10*3/uL (ref 0.0–0.7)
Eosinophils Relative: 3 %
HCT: 42.3 % (ref 36.0–46.0)
Hemoglobin: 13.9 g/dL (ref 12.0–15.0)
Lymphocytes Relative: 21 %
Lymphs Abs: 2.3 10*3/uL (ref 0.7–4.0)
MCH: 29.8 pg (ref 26.0–34.0)
MCHC: 32.9 g/dL (ref 30.0–36.0)
MCV: 90.8 fL (ref 78.0–100.0)
Monocytes Absolute: 0.8 10*3/uL (ref 0.1–1.0)
Monocytes Relative: 8 %
Neutro Abs: 7.4 10*3/uL (ref 1.7–7.7)
Neutrophils Relative %: 68 %
Platelets: 239 10*3/uL (ref 150–400)
RBC: 4.66 MIL/uL (ref 3.87–5.11)
RDW: 13.7 % (ref 11.5–15.5)
WBC: 10.9 10*3/uL — ABNORMAL HIGH (ref 4.0–10.5)

## 2015-08-12 LAB — LIPASE, BLOOD: Lipase: 21 U/L (ref 11–51)

## 2015-08-12 LAB — TROPONIN I
Troponin I: <0.03 ng/mL (ref <0.031)
Troponin I: <0.03 ng/mL (ref <0.031)
Troponin I: <0.03 ng/mL (ref <0.031)

## 2015-08-12 LAB — GLUCOSE, CAPILLARY
Glucose-Capillary: 153 mg/dL — ABNORMAL HIGH (ref 65–99)
Glucose-Capillary: 161 mg/dL — ABNORMAL HIGH (ref 65–99)
Glucose-Capillary: 170 mg/dL — ABNORMAL HIGH (ref 65–99)

## 2015-08-12 LAB — ACETAMINOPHEN LEVEL: Acetaminophen (Tylenol), Serum: <10 ug/mL — ABNORMAL LOW (ref 10–30)

## 2015-08-12 LAB — TSH: TSH: 10.53 u[IU]/mL — ABNORMAL HIGH (ref 0.350–4.500)

## 2015-08-12 LAB — I-STAT TROPONIN, ED: Troponin i, poc: 0 ng/mL (ref 0.00–0.08)

## 2015-08-12 LAB — GAMMA GT: GGT: 416 U/L — ABNORMAL HIGH (ref 7–50)

## 2015-08-12 MED ORDER — ENOXAPARIN SODIUM 60 MG/0.6ML ~~LOC~~ SOLN
60.0000 mg | SUBCUTANEOUS | Status: DC
  Administered 2015-08-12 – 2015-08-13 (×2): 60 mg via SUBCUTANEOUS
  Filled 2015-08-12 (×2): qty 0.6

## 2015-08-12 MED ORDER — SERTRALINE HCL 50 MG PO TABS
150.0000 mg | ORAL_TABLET | Freq: Every day | ORAL | Status: DC
  Administered 2015-08-12 – 2015-08-16 (×3): 150 mg via ORAL
  Filled 2015-08-12: qty 6
  Filled 2015-08-12: qty 3
  Filled 2015-08-12: qty 1

## 2015-08-12 MED ORDER — SODIUM CHLORIDE 0.9 % IV SOLN
3.0000 g | Freq: Four times a day (QID) | INTRAVENOUS | Status: DC
  Administered 2015-08-12 – 2015-08-15 (×11): 3 g via INTRAVENOUS
  Filled 2015-08-12 (×15): qty 3

## 2015-08-12 MED ORDER — ALBUTEROL SULFATE (2.5 MG/3ML) 0.083% IN NEBU: 3.0000 mL | INHALATION_SOLUTION | RESPIRATORY_TRACT | Status: DC | PRN

## 2015-08-12 MED ORDER — MORPHINE SULFATE (PF) 4 MG/ML IV SOLN
INTRAVENOUS | Status: AC
  Administered 2015-08-12: 4.65 mg via INTRAVENOUS
  Filled 2015-08-12: qty 2

## 2015-08-12 MED ORDER — LORATADINE 10 MG PO TABS
10.0000 mg | ORAL_TABLET | Freq: Every day | ORAL | Status: DC
  Administered 2015-08-12 – 2015-08-16 (×3): 10 mg via ORAL
  Filled 2015-08-12 (×3): qty 1

## 2015-08-12 MED ORDER — SODIUM CHLORIDE 0.9% FLUSH
3.0000 mL | Freq: Two times a day (BID) | INTRAVENOUS | Status: DC
  Administered 2015-08-12 – 2015-08-16 (×5): 3 mL via INTRAVENOUS

## 2015-08-12 MED ORDER — LOSARTAN POTASSIUM 25 MG PO TABS
25.0000 mg | ORAL_TABLET | Freq: Every day | ORAL | Status: DC
  Administered 2015-08-12 – 2015-08-16 (×4): 25 mg via ORAL
  Filled 2015-08-12 (×4): qty 1

## 2015-08-12 MED ORDER — ACETAMINOPHEN 325 MG PO TABS: 650.0000 mg | ORAL_TABLET | ORAL | Status: DC | PRN

## 2015-08-12 MED ORDER — LEVOTHYROXINE SODIUM 25 MCG PO TABS: 12.5000 ug | ORAL_TABLET | Freq: Every day | ORAL | Status: DC

## 2015-08-12 MED ORDER — LEVOTHYROXINE SODIUM 25 MCG PO TABS
12.5000 ug | ORAL_TABLET | Freq: Every day | ORAL | Status: DC
  Administered 2015-08-13 – 2015-08-16 (×4): 12.5 ug via ORAL
  Filled 2015-08-12 (×4): qty 1

## 2015-08-12 MED ORDER — LEVOTHYROXINE SODIUM 75 MCG PO TABS
150.0000 ug | ORAL_TABLET | Freq: Every day | ORAL | Status: DC
  Administered 2015-08-13 – 2015-08-16 (×4): 150 ug via ORAL
  Filled 2015-08-12 (×4): qty 2

## 2015-08-12 MED ORDER — ROPINIROLE HCL 1 MG PO TABS
1.0000 mg | ORAL_TABLET | Freq: Once | ORAL | Status: AC
  Administered 2015-08-12: 1 mg via ORAL
  Filled 2015-08-12: qty 1

## 2015-08-12 MED ORDER — ASPIRIN 81 MG PO CHEW
81.0000 mg | CHEWABLE_TABLET | Freq: Every day | ORAL | Status: DC
  Administered 2015-08-13 – 2015-08-16 (×3): 81 mg via ORAL
  Filled 2015-08-12 (×3): qty 1

## 2015-08-12 MED ORDER — TECHNETIUM TC 99M MEBROFENIN IV KIT
5.4400 | PACK | Freq: Once | INTRAVENOUS | Status: AC | PRN
  Administered 2015-08-12: 5 via INTRAVENOUS

## 2015-08-12 MED ORDER — NITROGLYCERIN 0.4 MG SL SUBL: 0.4000 mg | SUBLINGUAL_TABLET | SUBLINGUAL | Status: DC | PRN

## 2015-08-12 MED ORDER — ASPIRIN 325 MG PO TABS
325.0000 mg | ORAL_TABLET | Freq: Once | ORAL | Status: AC
  Administered 2015-08-12: 325 mg via ORAL
  Filled 2015-08-12: qty 1

## 2015-08-12 MED ORDER — ATORVASTATIN CALCIUM 80 MG PO TABS
80.0000 mg | ORAL_TABLET | Freq: Every day | ORAL | Status: DC
  Administered 2015-08-12: 80 mg via ORAL
  Filled 2015-08-12: qty 1

## 2015-08-12 MED ORDER — PANTOPRAZOLE SODIUM 40 MG PO TBEC
40.0000 mg | DELAYED_RELEASE_TABLET | Freq: Two times a day (BID) | ORAL | Status: DC
  Administered 2015-08-12 – 2015-08-16 (×8): 40 mg via ORAL
  Filled 2015-08-12 (×8): qty 1

## 2015-08-12 MED ORDER — METOPROLOL SUCCINATE ER 100 MG PO TB24
100.0000 mg | ORAL_TABLET | Freq: Every day | ORAL | Status: DC
  Administered 2015-08-12 – 2015-08-16 (×4): 100 mg via ORAL
  Filled 2015-08-12: qty 4
  Filled 2015-08-12: qty 1
  Filled 2015-08-12 (×3): qty 4

## 2015-08-12 MED ORDER — ASPIRIN 81 MG PO CHEW: 81.0000 mg | CHEWABLE_TABLET | Freq: Every day | ORAL | Status: DC

## 2015-08-12 MED ORDER — MORPHINE SULFATE (PF) 2 MG/ML IV SOLN: 2.0000 mg | Freq: Once | INTRAVENOUS | Status: DC

## 2015-08-12 MED ORDER — LEVOTHYROXINE SODIUM 150 MCG PO TABS: 150.0000 ug | ORAL_TABLET | Freq: Every day | ORAL | Status: DC

## 2015-08-12 MED ORDER — MORPHINE SULFATE (PF) 4 MG/ML IV SOLN
4.6500 mg | Freq: Once | INTRAVENOUS | Status: AC
  Administered 2015-08-12: 4.65 mg via INTRAVENOUS

## 2015-08-12 MED ORDER — INSULIN ASPART 100 UNIT/ML ~~LOC~~ SOLN
0.0000 [IU] | Freq: Three times a day (TID) | SUBCUTANEOUS | Status: DC
Start: 2015-08-12 — End: 2015-08-16
  Administered 2015-08-12: 2 [IU] via SUBCUTANEOUS
  Administered 2015-08-13: 1 [IU] via SUBCUTANEOUS
  Administered 2015-08-13: 3 [IU] via SUBCUTANEOUS
  Administered 2015-08-13 – 2015-08-14 (×2): 2 [IU] via SUBCUTANEOUS
  Administered 2015-08-15 (×2): 3 [IU] via SUBCUTANEOUS
  Administered 2015-08-15: 1 [IU] via SUBCUTANEOUS
  Administered 2015-08-16: 3 [IU] via SUBCUTANEOUS
  Administered 2015-08-16: 1 [IU] via SUBCUTANEOUS

## 2015-08-12 NOTE — ED Notes (Signed)
Pt c/o central chest pain for several days,  St's some nausea without vomiting,

## 2015-08-12 NOTE — ED Notes (Signed)
Patient transported to Ultrasound 

## 2015-08-12 NOTE — Progress Notes (Signed)
Subjective: Her husband was at bedside. She reports she has been having chest pressure with extension to the right shoulder at rest and with exertion for the last week that does not vary with inspiration. Several days ago, she noted onset of right upper quadrant abdominal pain after eating a bowl spaghetti. She reports she has the appetite of her mother and the stomach of her father and cannot resist spaghetti.  Objective: Vital signs in last 24 hours: Filed Vitals:   08/12/15 0430 08/12/15 0500 08/12/15 0730 08/12/15 0819  BP: 165/99 156/85 117/79 117/90  Pulse: 77 79 78 89  Temp:    97.7 F (36.5 C)  TempSrc:    Oral  Resp: 20 23  20   Height:    5' 2"  (1.575 m)  Weight:    256 lb (116.121 kg)  SpO2: 91% 91%  96%   Weight change:  No intake or output data in the 24 hours ending 08/12/15 1349  General: obese Caucasian woman, sitting at bedside HEENT: EOMI, no scleral icterus, oropharynx clear Cardiac: RRR, no rubs, murmurs or gallops though auscultation limited by large body habitus Pulm: diminished breath sounds bilaterally Abd: soft, BS present, pain with palpation of RUQ Ext: warm and well perfused, no pedal edema Neuro: responds to questions appropriately; moving all extremities freely  Lab Results: Basic Metabolic Panel:  Recent Labs Lab 08/12/15 0258 08/12/15 0528  NA 139 140  K 3.9 3.7  CL 104 103  CO2 26 26  GLUCOSE 185* 142*  BUN 10 9  CREATININE 0.80 0.76  CALCIUM 9.4 9.5   Liver Function Tests:  Recent Labs Lab 08/12/15 0258 08/12/15 0528  AST 177* 159*  ALT 79* 92*  ALKPHOS 290* 286*  BILITOT 0.7 0.6  PROT 6.7 6.6  ALBUMIN 3.3* 3.3*    Recent Labs Lab 08/12/15 0528  LIPASE 21   CBC:  Recent Labs Lab 08/12/15 0258  WBC 10.9*  NEUTROABS 7.4  HGB 13.9  HCT 42.3  MCV 90.8  PLT 239   Cardiac Enzymes:  Recent Labs Lab 08/12/15 0528  TROPONINI <0.03   CBG:  Recent Labs Lab 08/12/15 0943  GLUCAP 153*   Thyroid Function  Tests:  Recent Labs Lab 08/12/15 0543  TSH 10.530*   Studies/Results: Dg Chest 2 View  08/12/2015  CLINICAL DATA:  Acute onset of substernal chest pain and shortness of breath. Nausea and vomiting. Initial encounter. EXAM: CHEST  2 VIEW COMPARISON:  Chest radiograph performed 02/03/2015 FINDINGS: The lungs are well-aerated. Minimal bibasilar atelectasis is noted. Mild peribronchial thickening is seen. There is no evidence of pleural effusion or pneumothorax. The heart is normal in size; the mediastinal contour is within normal limits. No acute osseous abnormalities are seen. IMPRESSION: Minimal bibasilar atelectasis noted. Mild peribronchial thickening seen. Electronically Signed   By: Garald Balding M.D.   On: 08/12/2015 02:19   US Abdomen Complete  08/12/2015  CLINICAL DATA:  Epigastric and right upper quadrant pain for 1 week. EXAM: ABDOMEN ULTRASOUND COMPLETE COMPARISON:  None. FINDINGS: Gallbladder: Cholelithiasis without over distension or focal tenderness to suggest acute cholecystitis. Common bile duct: Diameter: 6 mm, upper limits of normal. Where visualized, no filling defect. Liver: 12 mm hyperechoic lesion in the upper left liver which is homogeneous appearing. No visible cirrhosis. IVC: Limited visualization is negative. Pancreas: Limited visualization. Either GDA or main duct is visualized at the head. If main duct, upper limits of normal at 3 mm. Spleen: Size and appearance within normal limits. Right  Kidney: Length: 10 cm. Echogenicity within normal limits. No mass or hydronephrosis visualized. Left Kidney: Length: 11 cm. Echogenicity within normal limits. No mass or hydronephrosis visualized. Abdominal aorta: Only the proximal aorta could be seen due to bowel gas. No evidence of aneurysm. IMPRESSION: 1. Cholelithiasis without acute cholecystitis. 2. 12 mm hyperechoic lesion in the left liver favoring hemangioma. Suggest follow-up ultrasound in 6 months. 3. Limited visualization due to  bowel gas and body habitus. Electronically Signed   By: Monte Fantasia M.D.   On: 08/12/2015 03:34   Medications: I have reviewed the patient's current medications. Scheduled Meds: . [START ON 08/13/2015] aspirin  81 mg Oral Daily  . atorvastatin  80 mg Oral Daily  . enoxaparin (LOVENOX) injection  60 mg Subcutaneous Q24H  . insulin aspart  0-9 Units Subcutaneous TID WC  . [START ON 08/13/2015] levothyroxine  150 mcg Oral QAC breakfast   And  . [START ON 08/13/2015] levothyroxine  12.5 mcg Oral QAC breakfast  . loratadine  10 mg Oral Daily  . losartan  25 mg Oral Daily  . metoprolol succinate  100 mg Oral Daily  . pantoprazole  40 mg Oral BID AC  . sertraline  150 mg Oral Daily  . sodium chloride flush  3 mL Intravenous Q12H   Continuous Infusions:  PRN Meds:.albuterol, nitroGLYCERIN Assessment/Plan: Principal Problem:   Chest pain Active Problems:   Hypothyroidism   HLD (hyperlipidemia)   Depression   Essential hypertension   GERD   CAD (coronary artery disease)   Diabetes mellitus type 2 with complications ([facility])   Tobacco use disorder   Chronic cough   Symptomatic cholelithiasis   Elevated liver enzymes  Symptomatic cholelithiasis: Ab [place] 6/4 with cholelithiasis though common bile duct diameter within the upper limit of normal however study is limited due to large body habitus. She does have abnormal LFTs [AST/ALT > 2] and elevated GGT confirms hepatobiliary source of elevated alkaline phosphatase. HCV negative. -Follow-up HIDA scan -Continue pantoprazole 40 mg twice daily -Follow-up HBV labwork  Chest pressure: May be in the setting of symptomatic cholelithiasis though unclear if related to angina as it has been occurring at rest and without exertion. Cardiac cath 12/2010 notable for 99% mid RCA occlusion with placement of 2 bare metal stents. The stress test 2015 was unremarkable, she was to repeat in December 2016 which never happened. Initial troponin negative, EKG  reassuring. -Follow-up troponins 2 -Consult to cardiology for stress test. Per radiology, tracer has half-life of 6 hours so stress test can be done tomorrow.   Hypothyroidism: TSH 10, elevated from 1.5 back in March 2017. Suspect there may be non-adherence as usual dosing is 1 microgram per kilogram. -Continue Synthroid 172.5 g for now though will have to assess adherence  Hyperlipidemia: Continue Lipitor 80 mg daily  Coronary artery disease s/p PCI [BMS in 12/2010]: Continue aspirin 29m.  Non-insulin-dependent type 2 diabetes: A1c 7.6, December 2016.  -Continue moderate sliding scale insulin  Depression: Continue sertraline 150 mg daily.  Hypertension: Blood pressures trending 110s-160s/70s-90s. -Continue losartan 25 mg daily -Continue metoprolol 100 mg daily  #FEN:  -Diet: Carb Mod, NPO at midnight  #DVT prophylaxis: Lovenox  #CODE STATUS: FULL CODE  Dispo: Disposition is deferred at this time, awaiting improvement of current medical problems.   The patient does have a current PCP (Loleta Chance MD) and [facility].  The patient does not know have transportation limitations that hinder transportation to clinic appointments.  .Marland Kitchen  Services Needed at time of discharge: Y = Yes, Blank = No PT:   OT:   RN:   Equipment:   Other:       Riccardo Dubin, MD 08/12/2015, 1:49 PM

## 2015-08-12 NOTE — Progress Notes (Signed)
Upon admission, pt reports loose stools x3 over the last 24hrs. Placed pt on enteric precautions, sent down sample of pt's first stool here. Per lab, stool collected did not qualify to run for c-diff because it wasn't watery, it was too formed. We have not seen any loose stools since pt has been here for the past few hours. Discontinuing enteric precautions, will continue to monitor for signs of c-diff.

## 2015-08-12 NOTE — ED Notes (Signed)
Admitting at bedside 

## 2015-08-12 NOTE — H&P (Signed)
Date: 08/12/2015               Patient Name:  Kendra Adams MRN: [id number]  DOB: 1960/08/21 Age / Sex: 55 y.o., female   PCP: Loleta Chance, MD         Medical Service: Internal Medicine Teaching Service         Attending Physician: Dr. Annia Belt, MD    First Contact: Dr. Posey Pronto  Pager: [phone]  Second Contact: Dr. Posey Pronto Pager: [phone]       After Hours (After 5p/  First Contact Pager: [phone]  weekends / holidays): Second Contact Pager: [phone]   Chief Complaint: chest pain and abdominal pain   History of Present Illness: Patient is a 55 yo F with a PMHx of CAD, MI, HTN, HLD, hypothyroidism, GERD, DM presenting with a chief complaint of chest pain and abdominal pain. Patient reports having intermittent, "dull/ aching" chest pain for the past 7 days which radiates to her L shoulder, L arm, neck, and jaw. States her neck feels "full." States the chest pain usually occurs whenever she has abdominal pain and even occurs at rest. The pain lasts anywhere between seconds to up to one hour. Denies having any associated SOB, diaphoresis, nausea, or vomiting. States she is a smoker and has a chronic cough productive of clear sputum; there is no recent change in the frequency of her cough or amount of sputum produced. Also reports wheezing and uses her Albuterol inhaler twice a week. Reports taking Omeprazole daily but still continues to have GERD symptoms on a regular basis. States she likes to eat lots of fried foods, spicy foods, and spaghetti.  Patient also reports having intermittent RUQ abdominal pain for the past 5-7 days. States the pain initially started after she "ate a healthy serving of spaghetti." She took Omeprazole which made her feel better. Reports passing flatus and having regular bowel movements, although did have some episodes of diarrhea and loose stools.   Records show patient has a cardiac cath in 12/2010 showing EF 65-70%, 99% mRCA occlusion s/p 2 BMS to the RCA. She  had a stress test in 2015 which was normal. She saw cardiology 02/2015 and was supposed to get a repeat stress test but it was never done.  Meds: No current facility-administered medications for this encounter.   Current Outpatient Prescriptions  Medication Sig Dispense Refill  . acetaminophen (TYLENOL) 325 MG tablet Take 650 mg by mouth every 4 (four) hours as needed for mild pain.    Marland Kitchen albuterol (PROVENTIL HFA;VENTOLIN HFA) 108 (90 Base) MCG/ACT inhaler Inhale 2 puffs into the lungs every 4 (four) hours as needed for wheezing or shortness of breath. Dispense with aerochamber 1 Inhaler 0  . aspirin 81 MG chewable tablet Chew 81 mg by mouth daily.    Marland Kitchen atorvastatin (LIPITOR) 80 MG tablet Take 1 tablet (80 mg total) by mouth daily. 90 tablet 3  . Blood Glucose Monitoring Suppl DEVI 1 Device by Does not apply route daily before breakfast. 1 each 0  . glipiZIDE (GLUCOTROL) 5 MG tablet Take 0.5 tablets (2.5 mg total) by mouth daily. 90 tablet 4  . Glucose Blood (BLOOD GLUCOSE TEST STRIPS) STRP 1 strip by In Vitro route daily before breakfast. 100 each 0  . Insulin Pen Needle (BD PEN NEEDLE NANO U/F) 32G X 4 MM MISC 1 Units/day by Does not apply route daily. 90 each 3  . Lancets MISC 1 Units by Does not apply route daily.  50 each 0  . levothyroxine (LEVOTHROID) 25 MCG tablet Take 0.5 tablets (12.5 mcg total) by mouth daily. In addition to the 123mg tablet (total 162.563m daily) 30 tablet 11  . levothyroxine (SYNTHROID, LEVOTHROID) 150 MCG tablet TAKE 1 TABLET BY MOUTH DAILY BEFORE BREAKFAST. 90 tablet 3  . loratadine (CLARITIN) 10 MG tablet Take 1 tablet (10 mg total) by mouth daily. 30 tablet 11  . losartan (COZAAR) 25 MG tablet Take 1 tablet (25 mg total) by mouth daily. 30 tablet 11  . metFORMIN (GLUCOPHAGE) 1000 MG tablet TAKE 1 TABLET (1,000 MG TOTAL) BY MOUTH 2 (TWO) TIMES DAILY. 60 tablet 11  . metoprolol succinate (TOPROL-XL) 100 MG 24 hr tablet Take 1 tablet (100 mg total) by mouth daily.  Take with or immediately following a meal. 30 tablet 5  . nitroGLYCERIN (NITROSTAT) 0.4 MG SL tablet Place 1 tablet (0.4 mg total) under the tongue every 5 (five) minutes as needed for chest pain. 25 tablet 3  . Omega-3 Fatty Acids (FISH OIL) 1000 MG CAPS Take 1 capsule by mouth daily.    . Marland Kitchenmeprazole (PRILOSEC) 40 MG capsule TAKE 1 CAPSULE BY MOUTH DAILY. 90 capsule 1  . rOPINIRole (REQUIP) 1 MG tablet TAKE 1 TABLET (1 MG TOTAL) BY MOUTH AT BEDTIME. 30 tablet 4  . sertraline (ZOLOFT) 100 MG tablet Take 1.5 tablets (150 mg total) by mouth daily. 90 tablet 3  . Spacer/Aero-Holding Chambers (AEROCHAMBER PLUS) inhaler Use as instructed 1 each 2  . ipratropium (ATROVENT) 0.06 % nasal spray Place 2 sprays into both nostrils 4 (four) times daily. (Patient not taking: Reported on 08/12/2015) 15 mL 0    Allergies: Allergies as of 08/12/2015 - Review Complete 08/12/2015  Allergen Reaction Noted  . Gabapentin  07/25/2014  . Nsaids  09/25/2011  . Tramadol hcl  02/22/2010   Past Medical History  Diagnosis Date  . Allergic rhinitis   . Tobacco abuse   . Depression   . GERD (gastroesophageal reflux disease)   . Hyperlipidemia   . Hypothyroidism   . CAD (coronary artery disease)     cath 12/13/10: mRCA 99%, EF 65-70%;  s/p BMS to mRCA  . HTN (hypertension)   . DM2 (diabetes mellitus, type 2) ([facility]  . Hyperlipidemia   . [facility]   Past Surgical History  Procedure Laterality Date  . Coronary angioplasty with stent placement    . Carpal tunnel release      w/ bone spurs  . Dilation and curettage of uterus     Family History  Problem Relation Age of Onset  . Diabetes Mother   . Hypertension Mother   . Diabetes Father   . Hypertension Father    Social History   Social History  . Marital Status: Married    Spouse Name: N/A  . Number of Children: 0  . Years of Education: 10th grade   Occupational History  . laConservation officer, [employer]   self-employed  . paper [employer]     in  the past   Social History Main Topics  . Smoking status: Former Smoker -- 0.50 packs/day for 40 years    Types: Cigarettes    Quit date: 05/19/2014  . Smokeless tobacco: Current User     Comment: Trying to cut back.  . Alcohol Use: No  . Drug Use: No  . Sexual Activity: Not on file   Other Topics Concern  . Not on file   Social History Narrative  Father died of suicide (gunshot) in 83. 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       Patient lives with her husband, her husband's niece Tammy who is mentally retarded and her best friend and the friend's daughter and 6 dogs                Review of Systems: Review of Systems  Constitutional: Negative for fever and chills.  HENT: Negative for congestion.   Eyes: Negative for blurred vision and pain.  Respiratory: Positive for cough, sputum production and wheezing. Negative for shortness of breath.   Cardiovascular: Positive for chest pain. Negative for palpitations, orthopnea and leg swelling.  Gastrointestinal: Positive for abdominal pain and diarrhea. Negative for nausea, vomiting and blood in stool.  Genitourinary: Negative for dysuria and flank pain.  Musculoskeletal: Negative for myalgias.  Skin: Negative for itching and rash.  Neurological: Negative for sensory change, focal weakness and headaches.    Physical Exam: Blood pressure 165/99, pulse 77, temperature 98.4 F (36.9 C), temperature source Oral, resp. rate 20, SpO2 91 %. Physical Exam  Constitutional: She is oriented to person, place, and time. She appears well-developed and well-nourished. No distress.  HENT:  Head: Normocephalic and atraumatic.  Mouth/Throat: Oropharynx is clear and moist.  Eyes: EOM are normal. Pupils are equal, round, and reactive to light.  Neck: Normal range of motion. Neck supple. No tracheal deviation present.  Cardiovascular: Normal rate,  regular rhythm and intact distal pulses.  Exam reveals no gallop and no friction rub.   No murmur heard. Pulmonary/Chest: Effort normal. She has wheezes. She has no rales.  Abdominal: Soft. Bowel sounds are normal. She exhibits no distension. There is no tenderness. There is no rebound and no guarding.  Murphy's sign negative   Musculoskeletal: She exhibits no edema.  Neurological: She is alert and oriented to person, place, and time.  Skin: Skin is warm and dry. She is not diaphoretic.    Lab results: Basic Metabolic Panel:  Recent Labs  08/12/15 0258  NA 139  K 3.9  CL 104  CO2 26  GLUCOSE 185*  BUN 10  CREATININE 0.80  CALCIUM 9.4   Liver Function Tests:  Recent Labs  08/12/15 0258  AST 177*  ALT 79*  ALKPHOS 290*  BILITOT 0.7  PROT 6.7  ALBUMIN 3.3*   CBC:  Recent Labs  08/12/15 0258  WBC 10.9*  NEUTROABS 7.4  HGB 13.9  HCT 42.3  MCV 90.8  PLT 239   Urine Drug Screen: Drugs of Abuse     Component Value Date/Time   LABOPIA NONE DETECTED 02/10/2007 2022   COCAINSCRNUR POSITIVE* 02/10/2007 2022   LABBENZ NONE DETECTED 02/10/2007 2022   AMPHETMU NONE DETECTED 02/10/2007 2022   THCU POSITIVE* 02/10/2007 2022   LABBARB  02/10/2007 2022    NONE DETECTED        DRUG SCREEN FOR MEDICAL PURPOSES ONLY.  IF CONFIRMATION IS NEEDED FOR ANY PURPOSE, NOTIFY LAB WITHIN 5 DAYS.   Imaging results:  Dg Chest 2 View  08/12/2015  CLINICAL DATA:  Acute onset of substernal chest pain and shortness of breath. Nausea and vomiting. Initial encounter. EXAM: CHEST  2 VIEW COMPARISON:  Chest radiograph performed 02/03/2015 FINDINGS: The lungs are well-aerated. Minimal bibasilar atelectasis is noted. Mild peribronchial thickening is seen. There is no evidence of pleural effusion or pneumothorax. The heart is normal  in size; the mediastinal contour is within normal limits. No acute osseous abnormalities are seen. IMPRESSION: Minimal bibasilar atelectasis noted. Mild  peribronchial thickening seen. Electronically Signed   By: Garald Balding M.D.   On: 08/12/2015 02:19   US Abdomen Complete  08/12/2015  CLINICAL DATA:  Epigastric and right upper quadrant pain for 1 week. EXAM: ABDOMEN ULTRASOUND COMPLETE COMPARISON:  None. FINDINGS: Gallbladder: Cholelithiasis without over distension or focal tenderness to suggest acute cholecystitis. Common bile duct: Diameter: 6 mm, upper limits of normal. Where visualized, no filling defect. Liver: 12 mm hyperechoic lesion in the upper left liver which is homogeneous appearing. No visible cirrhosis. IVC: Limited visualization is negative. Pancreas: Limited visualization. Either GDA or main duct is visualized at the head. If main duct, upper limits of normal at 3 mm. Spleen: Size and appearance within normal limits. Right Kidney: Length: 10 cm. Echogenicity within normal limits. No mass or hydronephrosis visualized. Left Kidney: Length: 11 cm. Echogenicity within normal limits. No mass or hydronephrosis visualized. Abdominal aorta: Only the proximal aorta could be seen due to bowel gas. No evidence of aneurysm. IMPRESSION: 1. Cholelithiasis without acute cholecystitis. 2. 12 mm hyperechoic lesion in the left liver favoring hemangioma. Suggest follow-up ultrasound in 6 months. 3. Limited visualization due to bowel gas and body habitus. Electronically Signed   By: Monte Fantasia M.D.   On: 08/12/2015 03:34    Other results: EKG: NSR. No acute ST/ T wave changes.   Assessment & Plan by Problem: Active Problems:   Chest pain  Chest pain Patient is presenting with intermittent, dull/ aching chest pain for the past 1 week which usually associated with her abdominal pain and occurs even at rest. Cardiac cath in 12/2010 showing EF 65-70%, 99% mRCA occlusion s/p 2 BMS to the RCA. She had a stress test in 2015 which was normal.  She saw cardiology 02/2015 and was supposed to get a repeat stress test but it was never done. EKG at present is  not showing any acute ST/ T wave abnormalities and troponin negative making ACS less likely. Her chest pain could possibly be due to worsening GERD as patient does endorse having symptoms of acid reflux on a regular basis despite taking Omeprazole daily. Chest pain could also be possibly musculoskeletal in nature and partially due to radiation of RUQ abdominal pain.  -Admit to telemetry   -Aspirin 325 mg -Albuterol nebulizer prn as patient was noted to be wheezing on exam  -Continue to trend troponin -EKG in a.m. -Consult cardiology in a.m. for a possible stress test -Nitroglycerin prn chest pain  -Protonix 40 mg BID to control GERD symptoms   Chololithiasis Intermittent RUQ abdominal pain for the past 5-7 days. Labs showing AST 177, ALT 79, and alkaline phosphatase 290. White count mildly elevated at 10.9 but patient is afebrile. Patient does not consume alcohol. TSH was normal in 05/2015. Hep C antibody was negative in 02/2015. ANA checked in 05/2015 was negative making an autoimmune etiology less likely. Abdominal US done today showing cholelithiasis without acute cholecystitis. Also showing a 12 mm hepatic hemangioma in the left liver.  -Repeat U/S in 6  months -Acetaminophen level pending  -Hep surface antigen, surface antibody, and core antibody pending  -GGT pending. If GGT is normal, consider extrahepatic causes for elevation of alkaline phosphatase. -Lipase pending  -CMP in a.m.  HLD -Lipitor 80 mg daily   CAD -Cozaar 25 mg daily -Metoprolol 100 mg daily  HTN -Cozaar 25 mg daily -Metoprolol  100 mg daily  GERD  -Protonix 40 mg BID  DM2 Last A1c 7.6 in 02/2015.  -SSI -Repeat A1c   Hypothyroidism -TSH pending  -Continue home meds Levothyroxine 12.5 mcg daily and Levothyroxine 150 mcg daily   Depression -Zoloft 150 mg daily   Diet: NPO  DVT ppx: Lovenox   Dispo: Disposition is deferred at this time, awaiting improvement of current medical problems. Anticipated  discharge in approximately 2-3 day(s).   The patient does have a current PCP Loleta Chance, MD) and does need an [facility] hospital follow-up appointment after discharge.  The patient does not have transportation limitations that hinder transportation to clinic appointments.  Signed: Shela Leff, MD 08/12/2015, 5:03 AM

## 2015-08-12 NOTE — Consult Note (Signed)
Reason for Consult:Abdominal pain and acute cholecystitis Referring Physician: Elizabelle Fite is an 55 y.o. female.  HPI: Several days of right upper abdominal and mid chest pain.  History of CAD with previous stent in 2012, due for re-evaluation, but has been relatively asymptomatic  Morbidly obese.  Patient states that if was hard for her to distinguish her chest pain from her RUQ pain, which was worse was a concern.  Cme in overh=night, HIDA confirmed today the clinical impression of acute cholecystitis.  Past Medical History  Diagnosis Date  . Allergic rhinitis   . Tobacco abuse   . Depression   . GERD (gastroesophageal reflux disease)   . Hyperlipidemia   . Hypothyroidism   . CAD (coronary artery disease)     cath 12/13/10: mRCA 99%, EF 65-70%;  s/p BMS to mRCA  . HTN (hypertension)   . DM2 (diabetes mellitus, type 2) ([place])   . Hyperlipidemia   . MI (myocardial infarction) [facility])     Past Surgical History  Procedure Laterality Date  . Coronary angioplasty with stent placement    . Carpal tunnel release      w/ bone spurs  . Dilation and curettage of uterus      Family History  Problem Relation Age of Onset  . Diabetes Mother   . Hypertension Mother   . Diabetes Father   . Hypertension Father     Social History:  reports that she quit smoking about 14 months ago. Her smoking use included Cigarettes. She has a 20 pack-year smoking history. She uses smokeless tobacco. She reports that she does not drink alcohol or use illicit drugs.  Allergies:  Allergies  Allergen Reactions  . Gabapentin     Skin rash, lip swelling  . Nsaids     IBU - Rectal bleeds; states ASA & Tylenol OK  . Tramadol Hcl     "Just doesn't do anything for me"    Medications: I have reviewed the patient's current medications.  Results for orders placed or performed during the hospital encounter of 08/12/15 (from the past 48 hour(s))  CBC with Differential     Status: Abnormal    Collection Time: 08/12/15  2:58 AM  Result Value Ref Range   WBC 10.9 (H) 4.0 - 10.5 K/uL   RBC 4.66 3.87 - 5.11 MIL/uL   Hemoglobin 13.9 12.0 - 15.0 g/dL   HCT 42.3 36.0 - 46.0 %   MCV 90.8 78.0 - 100.0 fL   MCH 29.8 26.0 - 34.0 pg   MCHC 32.9 30.0 - 36.0 g/dL   RDW 13.7 11.5 - 15.5 %   Platelets 239 150 - 400 K/uL   Neutrophils Relative % 68 %   Neutro Abs 7.4 1.7 - 7.7 K/uL   Lymphocytes Relative 21 %   Lymphs Abs 2.3 0.7 - 4.0 K/uL   Monocytes Relative 8 %   Monocytes Absolute 0.8 0.1 - 1.0 K/uL   Eosinophils Relative 3 %   Eosinophils Absolute 0.3 0.0 - 0.7 K/uL   Basophils Relative 0 %   Basophils Absolute 0.0 0.0 - 0.1 K/uL  Comprehensive metabolic panel     Status: Abnormal   Collection Time: 08/12/15  2:58 AM  Result Value Ref Range   Sodium 139 135 - 145 mmol/L   Potassium 3.9 3.5 - 5.1 mmol/L   Chloride 104 101 - 111 mmol/L   CO2 26 22 - 32 mmol/L   Glucose, Bld 185 (H) 65 - 99 mg/dL  BUN 10 6 - 20 mg/dL   Creatinine, Ser 0.80 0.44 - 1.00 mg/dL   Calcium 9.4 8.9 - 10.3 mg/dL   Total Protein 6.7 6.5 - 8.1 g/dL   Albumin 3.3 (L) 3.5 - 5.0 g/dL   AST 177 (H) 15 - 41 U/L   ALT 79 (H) 14 - 54 U/L   Alkaline Phosphatase 290 (H) 38 - 126 U/L   Total Bilirubin 0.7 0.3 - 1.2 mg/dL   GFR calc non Af Amer >60 >60 mL/min   GFR calc Af Amer >60 >60 mL/min    Comment: (NOTE) The eGFR has been calculated using the CKD EPI equation. This calculation has not been validated in all clinical situations. eGFR's persistently <60 mL/min signify possible Chronic Kidney Disease.    Anion gap 9 5 - 15  I-Stat Troponin, ED (not at [facility])     Status: None   Collection Time: 08/12/15  3:08 AM  Result Value Ref Range   Troponin i, poc 0.00 0.00 - 0.08 ng/mL   Comment 3            Comment: Due to the release kinetics of cTnI, a negative result within the first hours of the onset of symptoms does not rule out myocardial infarction with certainty. If myocardial infarction is still  suspected, repeat the test at appropriate intervals.   Lipase, blood     Status: None   Collection Time: 08/12/15  5:28 AM  Result Value Ref Range   Lipase 21 11 - 51 U/L  Gamma GT     Status: Abnormal   Collection Time: 08/12/15  5:28 AM  Result Value Ref Range   GGT 416 (H) 7 - 50 U/L  Comprehensive metabolic panel     Status: Abnormal   Collection Time: 08/12/15  5:28 AM  Result Value Ref Range   Sodium 140 135 - 145 mmol/L   Potassium 3.7 3.5 - 5.1 mmol/L   Chloride 103 101 - 111 mmol/L   CO2 26 22 - 32 mmol/L   Glucose, Bld 142 (H) 65 - 99 mg/dL   BUN 9 6 - 20 mg/dL   Creatinine, Ser 0.76 0.44 - 1.00 mg/dL   Calcium 9.5 8.9 - 10.3 mg/dL   Total Protein 6.6 6.5 - 8.1 g/dL   Albumin 3.3 (L) 3.5 - 5.0 g/dL   AST 159 (H) 15 - 41 U/L   ALT 92 (H) 14 - 54 U/L   Alkaline Phosphatase 286 (H) 38 - 126 U/L   Total Bilirubin 0.6 0.3 - 1.2 mg/dL   GFR calc non Af Amer >60 >60 mL/min   GFR calc Af Amer >60 >60 mL/min    Comment: (NOTE) The eGFR has been calculated using the CKD EPI equation. This calculation has not been validated in all clinical situations. eGFR's persistently <60 mL/min signify possible Chronic Kidney Disease.    Anion gap 11 5 - 15  Troponin I (q 6hr x 3)     Status: None   Collection Time: 08/12/15  5:28 AM  Result Value Ref Range   Troponin I <0.03 <0.031 ng/mL    Comment:        NO INDICATION OF MYOCARDIAL INJURY.   Acetaminophen level     Status: Abnormal   Collection Time: 08/12/15  5:28 AM  Result Value Ref Range   Acetaminophen (Tylenol), Serum <10 (L) 10 - 30 ug/mL    Comment:        THERAPEUTIC CONCENTRATIONS VARY SIGNIFICANTLY. A  RANGE OF 10-30 ug/mL MAY BE AN EFFECTIVE CONCENTRATION FOR MANY PATIENTS. HOWEVER, SOME ARE BEST TREATED AT CONCENTRATIONS OUTSIDE THIS RANGE. ACETAMINOPHEN CONCENTRATIONS >150 ug/mL AT 4 HOURS AFTER INGESTION AND >50 ug/mL AT 12 HOURS AFTER INGESTION ARE OFTEN ASSOCIATED WITH TOXIC REACTIONS.   TSH      Status: Abnormal   Collection Time: 08/12/15  5:43 AM  Result Value Ref Range   TSH 10.530 (H) 0.350 - 4.500 uIU/mL  Glucose, capillary     Status: Abnormal   Collection Time: 08/12/15  9:43 AM  Result Value Ref Range   Glucose-Capillary 153 (H) 65 - 99 mg/dL    Dg Chest 2 View  08/12/2015  CLINICAL DATA:  Acute onset of substernal chest pain and shortness of breath. Nausea and vomiting. Initial encounter. EXAM: CHEST  2 VIEW COMPARISON:  Chest radiograph performed 02/03/2015 FINDINGS: The lungs are well-aerated. Minimal bibasilar atelectasis is noted. Mild peribronchial thickening is seen. There is no evidence of pleural effusion or pneumothorax. The heart is normal in size; the mediastinal contour is within normal limits. No acute osseous abnormalities are seen. IMPRESSION: Minimal bibasilar atelectasis noted. Mild peribronchial thickening seen. Electronically Signed   By: Garald Balding M.D.   On: 08/12/2015 02:19   Nm Hepatobiliary Liver Func  08/12/2015  CLINICAL DATA:  Evaluate for acute cholecystitis. Intermittent right upper quadrant pain. EXAM: NUCLEAR MEDICINE HEPATOBILIARY IMAGING TECHNIQUE: Sequential images of the abdomen were obtained out to 60 minutes following intravenous administration of radiopharmaceutical. RADIOPHARMACEUTICALS:  5.4  mCi Tc-1m Choletec IV COMPARISON:  Ultrasound 08/12/2015 FINDINGS: Prompt uptake and biliary excretion of activity by the liver is seen. Biliary activity passes into small bowel, consistent with patent common bile duct. After 30 minutes of imaging no gallbladder activity was identified. Subsequently the patient received 4.6 mg of morphine an additional 1 millicurie of Choletec. Imaging was carried out for additional 30 minutes without visualization of the gallbladder. IMPRESSION: 1. Nonvisualization of the gallbladder following morphine administration compatible with cystic duct obstruction and acute cholecystitis. These results will be called to the  ordering clinician or representative by the Radiologist Assistant, and communication documented in the PACS or zVision Dashboard. Electronically Signed   By: TKerby MoorsM.D.   On: 08/12/2015 15:00   UKoreaAbdomen Complete  08/12/2015  CLINICAL DATA:  Epigastric and right upper quadrant pain for 1 week. EXAM: ABDOMEN ULTRASOUND COMPLETE COMPARISON:  None. FINDINGS: Gallbladder: Cholelithiasis without over distension or focal tenderness to suggest acute cholecystitis. Common bile duct: Diameter: 6 mm, upper limits of normal. Where visualized, no filling defect. Liver: 12 mm hyperechoic lesion in the upper left liver which is homogeneous appearing. No visible cirrhosis. IVC: Limited visualization is negative. Pancreas: Limited visualization. Either GDA or main duct is visualized at the head. If main duct, upper limits of normal at 3 mm. Spleen: Size and appearance within normal limits. Right Kidney: Length: 10 cm. Echogenicity within normal limits. No mass or hydronephrosis visualized. Left Kidney: Length: 11 cm. Echogenicity within normal limits. No mass or hydronephrosis visualized. Abdominal aorta: Only the proximal aorta could be seen due to bowel gas. No evidence of aneurysm. IMPRESSION: 1. Cholelithiasis without acute cholecystitis. 2. 12 mm hyperechoic lesion in the left liver favoring hemangioma. Suggest follow-up ultrasound in 6 months. 3. Limited visualization due to bowel gas and body habitus. Electronically Signed   By: JMonte FantasiaM.D.   On: 08/12/2015 03:34    Review of Systems  Constitutional: Negative for fever and chills.  Cardiovascular: Positive for chest pain.  Gastrointestinal: Positive for nausea, abdominal pain and diarrhea. Negative for vomiting.  All other systems reviewed and are negative.  Blood pressure 107/71, pulse 71, temperature 97.9 F (36.6 C), temperature source Oral, resp. rate 20, height 5' 2" (1.575 m), weight 116.121 kg (256 lb), SpO2 91 %. Physical Exam   Nursing note and vitals reviewed. Constitutional: She is oriented to person, place, and time.  Morbidly obese  HENT:  Head: Normocephalic and atraumatic.  Eyes: Conjunctivae and EOM are normal. Pupils are equal, round, and reactive to light. No scleral icterus.  Neck: Normal range of motion. Neck supple. No JVD present.  Cardiovascular: Normal rate and regular rhythm.   Murmur heard. Respiratory: Effort normal.  GI: Soft. Normal appearance and bowel sounds are normal. There is tenderness (mild tenderness) in the right upper quadrant and epigastric area. There is no rigidity, no rebound and no guarding.  Neurological: She is alert and oriented to person, place, and time. She has normal reflexes.  Skin: Skin is warm and dry.  Psychiatric: She has a normal mood and affect. Her behavior is normal. Judgment and thought content normal.    Assessment/Plan: The patient has cholelithiasis and acute cholecystitis based on clinical examination and nuclear med HIDA.  She needs her gallbladder removed,but with her history of smoking, CAD, stent and no evaluation for several years, cardiac clearance is advised.  This would mean that her cholecystectomy will be postopned until laater this week--likely Tuesday or Wednesday.  Until then the patient should be placed on IV antibiotic and stal on a liquid diet.  Okay to stay on ASA 81.  Jinnifer Montejano 08/12/2015, 3:52 PM

## 2015-08-12 NOTE — ED Provider Notes (Signed)
CSN: [id number]     Arrival date & time 08/12/15  0101 History  By signing my name below, I, Jasmyn B. Alexander, attest that this documentation has been prepared under the direction and in the presence of Davonna Belling, MD.  Electronically Signed: Tedra Coupe. Sheppard Coil, ED Scribe. 08/12/2015. 1:29 AM.  Chief Complaint  Patient presents with  . Chest Pain    The history is provided by the patient. No language interpreter was used.   HPI Comments: Kendra Adams is a 55 y.o. female current smoker with PMHx of HLD, GERD, CAD with stents, HTN, MI, and DM who presents to the Emergency Department complaining of gradual onset, intermittent, radiating chest pain to neck, bilateral shoulders, and arms onset 3 days. She notes that chest pain episode PTA lasted for about an hour. She says she is not in pain at this current moment. She has associated productive cough and indigestion. Pain is exacerbated when she bends over. She reports taking Omeprazole with relief of radiating pain and indigestion, but no relief of chest pain. Patient reports she is very compliant with all medications. She says she is unable to follow-up with cardiologist, Dr. Ena Dawley, due to lack of insurance.  Past Medical History  Diagnosis Date  . Allergic rhinitis   . Tobacco abuse   . Depression   . GERD (gastroesophageal reflux disease)   . Hyperlipidemia   . Hypothyroidism   . CAD (coronary artery disease)     cath 12/13/10: mRCA 99%, EF 65-70%;  s/p BMS to mRCA  . HTN (hypertension)   . DM2 (diabetes mellitus, type 2) ([place])   . Hyperlipidemia   . MI (myocardial infarction) [facility])    Past Surgical History  Procedure Laterality Date  . Coronary angioplasty with stent placement    . Carpal tunnel release      w/ bone spurs  . Dilation and curettage of uterus     Family History  Problem Relation Age of Onset  . Diabetes Mother   . Hypertension Mother   . Diabetes Father   . Hypertension Father    Social  History  Substance Use Topics  . Smoking status: Former Smoker -- 0.50 packs/day for 40 years    Types: Cigarettes    Quit date: 05/19/2014  . Smokeless tobacco: Current User     Comment: Trying to cut back.  . Alcohol Use: No   OB History    No data available     Review of Systems  Respiratory: Positive for cough.   Cardiovascular: Positive for chest pain.  Musculoskeletal: Positive for myalgias, arthralgias and neck pain.  All other systems reviewed and are negative.     Allergies  Gabapentin; Nsaids; and Tramadol hcl  Home Medications   Prior to Admission medications   Medication Sig Start Date End Date Taking? Authorizing Provider  acetaminophen (TYLENOL) 325 MG tablet Take 650 mg by mouth every 4 (four) hours as needed for mild pain.   Yes Historical Provider, MD  albuterol (PROVENTIL HFA;VENTOLIN HFA) 108 (90 Base) MCG/ACT inhaler Inhale 2 puffs into the lungs every 4 (four) hours as needed for wheezing or shortness of breath. Dispense with aerochamber 05/16/15  Yes Loleta Chance, MD  aspirin 81 MG chewable tablet Chew 81 mg by mouth daily.   Yes Historical Provider, MD  atorvastatin (LIPITOR) 80 MG tablet Take 1 tablet (80 mg total) by mouth daily. 11/20/14  Yes Loleta Chance, MD  Blood Glucose Monitoring Suppl DEVI 1 Device by  Does not apply route daily before breakfast. 05/23/14  Yes Ejiroghene E Emokpae, MD  glipiZIDE (GLUCOTROL) 5 MG tablet Take 0.5 tablets (2.5 mg total) by mouth daily. 04/04/15 04/03/16 Yes Loleta Chance, MD  Glucose Blood (BLOOD GLUCOSE TEST STRIPS) STRP 1 strip by In Vitro route daily before breakfast. 05/23/14  Yes Ejiroghene E Emokpae, MD  Insulin Pen Needle (BD PEN NEEDLE NANO U/F) 32G X 4 MM MISC 1 Units/day by Does not apply route daily. 02/26/15  Yes Loleta Chance, MD  Lancets MISC 1 Units by Does not apply route daily. 05/23/14  Yes Ejiroghene Arlyce Dice, MD  levothyroxine (LEVOTHROID) 25 MCG tablet Take 0.5 tablets (12.5 mcg total) by mouth daily. In  addition to the 175mg tablet (total 162.555m daily) 02/28/15 02/28/16 Yes KyLoleta ChanceMD  levothyroxine (SYNTHROID, LEVOTHROID) 150 MCG tablet TAKE 1 TABLET BY MOUTH DAILY BEFORE BREAKFAST. 04/19/15  Yes KyLoleta ChanceMD  loratadine (CLARITIN) 10 MG tablet Take 1 tablet (10 mg total) by mouth daily. 09/07/14  Yes ErLucious GrovesDO  losartan (COZAAR) 25 MG tablet Take 1 tablet (25 mg total) by mouth daily. 02/28/15 02/28/16 Yes KyLoleta ChanceMD  metFORMIN (GLUCOPHAGE) 1000 MG tablet TAKE 1 TABLET (1,000 MG TOTAL) BY MOUTH 2 (TWO) TIMES DAILY. 09/18/14  Yes ElBartholomew CrewsMD  metoprolol succinate (TOPROL-XL) 100 MG 24 hr tablet Take 1 tablet (100 mg total) by mouth daily. Take with or immediately following a meal. 05/16/15  Yes KyLoleta ChanceMD  nitroGLYCERIN (NITROSTAT) 0.4 MG SL tablet Place 1 tablet (0.4 mg total) under the tongue every 5 (five) minutes as needed for chest pain. 01/19/15  Yes WiNorval GableMD  Omega-3 Fatty Acids (FISH OIL) 1000 MG CAPS Take 1 capsule by mouth daily.   Yes Historical Provider, MD  omeprazole (PRILOSEC) 40 MG capsule TAKE 1 CAPSULE BY MOUTH DAILY. 04/19/15  Yes KyLoleta ChanceMD  rOPINIRole (REQUIP) 1 MG tablet TAKE 1 TABLET (1 MG TOTAL) BY MOUTH AT BEDTIME. 04/02/15  Yes KyLoleta ChanceMD  sertraline (ZOLOFT) 100 MG tablet Take 1.5 tablets (150 mg total) by mouth daily. 01/22/15  Yes KyLoleta ChanceMD  Spacer/Aero-Holding Chambers (AEROCHAMBER PLUS) inhaler Use as instructed 02/03/15  Yes AsMelynda RippleMD  ipratropium (ATROVENT) 0.06 % nasal spray Place 2 sprays into both nostrils 4 (four) times daily. Patient not taking: Reported on 08/12/2015 10/03/14   ErMelony OverlyMD   BP 117/79 mmHg  Pulse 78  Temp(Src) 98.4 F (36.9 C) (Oral)  Resp 23  SpO2 91% Physical Exam  Constitutional: She is oriented to person, place, and time. She appears well-developed and well-nourished. No distress.  Obesity  HENT:  Head: Normocephalic and atraumatic.  Eyes: Conjunctivae are  normal.  Neck: No JVD present.  Cardiovascular: Normal rate.   Pulmonary/Chest: Effort normal.  Abdominal: She exhibits no distension. There is tenderness.  Right upper quadrant tenderness.   Musculoskeletal: She exhibits no edema.  No leg swelling  Neurological: She is alert and oriented to person, place, and time.  Skin: Skin is warm and dry.  Psychiatric: She has a normal mood and affect.  Nursing note and vitals reviewed.   ED Course  Procedures (including critical care time)  COORDINATION OF CARE: 1:26 AM-Discussed treatment plan which includes EKG, CXR, CBC panel, and CMP with pt at bedside and pt agreed to plan.   Labs Review Labs Reviewed  CBC WITH DIFFERENTIAL/PLATELET - Abnormal; Notable for the following:    WBC 10.9 (*)  All other components within normal limits  COMPREHENSIVE METABOLIC PANEL - Abnormal; Notable for the following:    Glucose, Bld 185 (*)    Albumin 3.3 (*)    AST 177 (*)    ALT 79 (*)    Alkaline Phosphatase 290 (*)    All other components within normal limits  GAMMA GT - Abnormal; Notable for the following:    GGT 416 (*)    All other components within normal limits  COMPREHENSIVE METABOLIC PANEL - Abnormal; Notable for the following:    Glucose, Bld 142 (*)    Albumin 3.3 (*)    AST 159 (*)    ALT 92 (*)    Alkaline Phosphatase 286 (*)    All other components within normal limits  TSH - Abnormal; Notable for the following:    TSH 10.530 (*)    All other components within normal limits  ACETAMINOPHEN LEVEL - Abnormal; Notable for the following:    Acetaminophen (Tylenol), Serum <10 (*)    All other components within normal limits  TROPONIN I  LIPASE, BLOOD  TROPONIN I  TROPONIN I  HEPATITIS B SURFACE ANTIGEN  HEPATITIS B SURFACE ANTIBODY  HEPATITIS B CORE ANTIBODY, IGM  I-STAT TROPOININ, ED    Imaging Review Dg Chest 2 View  08/12/2015  CLINICAL DATA:  Acute onset of substernal chest pain and shortness of breath. Nausea and  vomiting. Initial encounter. EXAM: CHEST  2 VIEW COMPARISON:  Chest radiograph performed 02/03/2015 FINDINGS: The lungs are well-aerated. Minimal bibasilar atelectasis is noted. Mild peribronchial thickening is seen. There is no evidence of pleural effusion or pneumothorax. The heart is normal in size; the mediastinal contour is within normal limits. No acute osseous abnormalities are seen. IMPRESSION: Minimal bibasilar atelectasis noted. Mild peribronchial thickening seen. Electronically Signed   By: Garald Balding M.D.   On: 08/12/2015 02:19   US Abdomen Complete  08/12/2015  CLINICAL DATA:  Epigastric and right upper quadrant pain for 1 week. EXAM: ABDOMEN ULTRASOUND COMPLETE COMPARISON:  None. FINDINGS: Gallbladder: Cholelithiasis without over distension or focal tenderness to suggest acute cholecystitis. Common bile duct: Diameter: 6 mm, upper limits of normal. Where visualized, no filling defect. Liver: 12 mm hyperechoic lesion in the upper left liver which is homogeneous appearing. No visible cirrhosis. IVC: Limited visualization is negative. Pancreas: Limited visualization. Either GDA or main duct is visualized at the head. If main duct, upper limits of normal at 3 mm. Spleen: Size and appearance within normal limits. Right Kidney: Length: 10 cm. Echogenicity within normal limits. No mass or hydronephrosis visualized. Left Kidney: Length: 11 cm. Echogenicity within normal limits. No mass or hydronephrosis visualized. Abdominal aorta: Only the proximal aorta could be seen due to bowel gas. No evidence of aneurysm. IMPRESSION: 1. Cholelithiasis without acute cholecystitis. 2. 12 mm hyperechoic lesion in the left liver favoring hemangioma. Suggest follow-up ultrasound in 6 months. 3. Limited visualization due to bowel gas and body habitus. Electronically Signed   By: Monte Fantasia M.D.   On: 08/12/2015 03:34   I have personally reviewed and evaluated these images and lab results as part of my medical  decision-making.   EKG Interpretation   Date/Time:  Sunday August 12 2015 01:06:54 EDT Ventricular Rate:  92 PR Interval:  156 QRS Duration: 76 QT Interval:  356 QTC Calculation: 440 R Axis:   25 Text Interpretation:  Normal sinus rhythm Normal ECG rate decreased since  previous Confirmed by Alvino Chapel  MD, Murry Khiev [phone]) on 08/12/2015 1:15:54  AM      MDM   Final diagnoses:  Chest pain, unspecified chest pain type  Biliary colic    Patient with chest pain. Will need rule out. EKG and labs reassuring. Also biliary colic. Does not appear to be acute cholecystitis. Admit to internal medicine residents.  I personally performed the services described in this documentation, which was scribed in my presence. The recorded information has been reviewed and is accurate.      Davonna Belling, MD 08/12/15 [phone]

## 2015-08-12 NOTE — Consult Note (Signed)
Cardiology Consult    Patient ID: Kendra Adams MRN: [id number], DOB/AGE: February 21, 1961   Admit date: 08/12/2015 Date of Consult: 08/12/2015  Primary Physician: Loleta Chance, MD Reason for Consult: Chest Pain Primary Cardiologist: Dr. Meda Coffee Requesting Provider: Dr. Beryle Beams   History of Present Illness    Kendra Adams is a 55 y.o. female with past medical history of CAD (s/p two BMS to [facility] in 2012), HTN, HLD, Type 2 DM, and GERD who presented to [facility] ED on 08/12/2015 for evaluation of chest pain, nausea, and vomiting for the past several days.  In talking with the patient she describes 3 different types of pain:   1. A shooting pain going across her right shoulder, having occurred 3 times this past week, and lasting less than 2 seconds. No association with food or exertion.  2. A pressure in the left side of her chest that is associated with a fullness radiating into her neck, starting one week ago. Says this can occur with rest or with exertion. Can last for hours at a time. The neck fullness does resemble her previous angina. However, she developed this pain yesterday evening after consuming a steak, bread, baked beans, potatoes, and macaroni and cheese.  3. A pain in her RUQ, worse with deep inspiration, and has been getting progressively worse over the past few months.  Labs this admission show a AST 177, ALT 79, Alk Phos 290, WBC 10.9, and GGT of 416. TSH elevated to 10.5. Cyclic troponin values have been negative. EKG shows NSR, HR 92, with no acute ST or T-wave changes noted.   CXR shows minimal bibasilar atelectasis with mild peribronchial thickening. Abdominal US showed cholelithiasis without acute cholecystitis. A 12 mm hyperechoic lesion was noted in the left liver with follow-up US recommended in 6 months. HIDA scan showed nonvisualization of the gallbladder following morphine administration compatible with cystic duct obstruction and acute cholecystitis.   Past Medical  History   Past Medical History  Diagnosis Date  . Allergic rhinitis   . Tobacco abuse   . Depression   . GERD (gastroesophageal reflux disease)   . Hyperlipidemia   . Hypothyroidism   . CAD (coronary artery disease)     cath 12/13/10: mRCA 99%, EF 65-70%;  s/p BMS to mRCA  . HTN (hypertension)   . DM2 (diabetes mellitus, type 2) ([facility])   . Hyperlipidemia   . MI (myocardial infarction) [facility])     Past Surgical History  Procedure Laterality Date  . Coronary angioplasty with stent placement    . Carpal tunnel release      w/ bone spurs  . Dilation and curettage of uterus       Allergies  Allergies  Allergen Reactions  . Gabapentin     Skin rash, lip swelling  . Nsaids     IBU - Rectal bleeds; states ASA & Tylenol OK  . Tramadol Hcl     "Just doesn't do anything for me"    Inpatient Medications    . [START ON 08/13/2015] aspirin  81 mg Oral Daily  . atorvastatin  80 mg Oral Daily  . enoxaparin (LOVENOX) injection  60 mg Subcutaneous Q24H  . insulin aspart  0-9 Units Subcutaneous TID WC  . [START ON 08/13/2015] levothyroxine  150 mcg Oral QAC breakfast   And  . [START ON 08/13/2015] levothyroxine  12.5 mcg Oral QAC breakfast  . loratadine  10 mg Oral Daily  . losartan  25 mg Oral  Daily  . metoprolol succinate  100 mg Oral Daily  . pantoprazole  40 mg Oral BID AC  . sertraline  150 mg Oral Daily  . sodium chloride flush  3 mL Intravenous Q12H    Family History    Family History  Problem Relation Age of Onset  . Diabetes Mother   . Hypertension Mother   . Diabetes Father   . Hypertension Father     Social History    Social History   Social History  . Marital Status: Married    Spouse Name: N/A  . Number of Children: 0  . Years of Education: 10th grade   Occupational History  . Conservation officer, nature     self-employed  . 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 Smoking status: Former Smoker -- 0.50 packs/day for 40 years    Types: Cigarettes     Quit date: 05/19/2014  . Smokeless tobacco: Current User     Comment: Trying to cut back.  . Alcohol Use: No  . Drug Use: No  . Sexual Activity: Not on file   Other Topics Concern  . Not on file   Social History Narrative   Father died of suicide (gunshot) in 11. 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       Patient lives with her husband, her husband's niece Tammy who is mentally retarded and her best friend and the friend's daughter and 6 dogs                 Review of Systems    General:  No chills, fever, night sweats or weight changes.  Cardiovascular:  No dyspnea on exertion, edema, orthopnea, palpitations, paroxysmal nocturnal dyspnea. Positive for chest pain.  Dermatological: No rash, lesions/masses Respiratory: No cough, dyspnea Urologic: No hematuria, dysuria Abdominal:   No diarrhea, bright red blood per rectum, melena, or hematemesis. Positive for nausea, vomiting, and abdominal pain.  Neurologic:  No visual changes, wkns, changes in mental status. All other systems reviewed and are otherwise negative except as noted above.  Physical Exam    Blood pressure 107/71, pulse 71, temperature 97.9 F (36.6 C), temperature source Oral, resp. rate 20, height _0  (1.575 m), weight 256 lb (116.121 kg), SpO2 91 %.  General: Pleasant, obese Caucasian female appearing in NAD Psych: Normal affect. Neuro: Alert and oriented X 3. Moves all extremities spontaneously. HEENT: Normal  Neck: Supple without bruits or JVD. Lungs:  Resp regular and unlabored, rhonchi at left base. No wheezing or rales appreciated. Heart: RRR no s3, s4, or murmurs. Abdomen: Soft, non-tender, non-distended, BS + x 4.  Extremities: No clubbing, cyanosis or edema. DP/PT/Radials 2+ and equal bilaterally.  Labs    Troponin [facility] Test)  Recent Labs  08/12/15 0308  TROPIPOC 0.00    Recent Labs   08/12/15 0528  TROPONINI <0.03   Lab Results  Component Value Date   WBC 10.9* 08/12/2015   HGB 13.9 08/12/2015   HCT 42.3 08/12/2015   MCV 90.8 08/12/2015   PLT 239 08/12/2015    Recent Labs Lab 08/12/15 0528  NA 140  K 3.7  CL 103  CO2 26  BUN 9  CREATININE 0.76  CALCIUM 9.5  PROT 6.6  BILITOT 0.6  ALKPHOS 286*  ALT 92*  AST 159*  GLUCOSE  142*   Lab Results  Component Value Date   CHOL 203* 02/26/2015   HDL 31* 02/26/2015   LDLCALC 132* 02/26/2015   TRIG 198* 02/26/2015   Lab Results  Component Value Date   DDIMER 0.30 09/25/2011     Radiology Studies    Dg Chest 2 View: 08/12/2015  CLINICAL DATA:  Acute onset of substernal chest pain and shortness of breath. Nausea and vomiting. Initial encounter. EXAM: CHEST  2 VIEW COMPARISON:  Chest radiograph performed 02/03/2015 FINDINGS: The lungs are well-aerated. Minimal bibasilar atelectasis is noted. Mild peribronchial thickening is seen. There is no evidence of pleural effusion or pneumothorax. The heart is normal in size; the mediastinal contour is within normal limits. No acute osseous abnormalities are seen. IMPRESSION: Minimal bibasilar atelectasis noted. Mild peribronchial thickening seen. Electronically Signed   By: Garald Balding M.D.   On: 08/12/2015 02:19   US Abdomen Complete: 08/12/2015  CLINICAL DATA:  Epigastric and right upper quadrant pain for 1 week. EXAM: ABDOMEN ULTRASOUND COMPLETE COMPARISON:  None. FINDINGS: Gallbladder: Cholelithiasis without over distension or focal tenderness to suggest acute cholecystitis. Common bile duct: Diameter: 6 mm, upper limits of normal. Where visualized, no filling defect. Liver: 12 mm hyperechoic lesion in the upper left liver which is homogeneous appearing. No visible cirrhosis. IVC: Limited visualization is negative. Pancreas: Limited visualization. Either GDA or main duct is visualized at the head. If main duct, upper limits of normal at 3 mm. Spleen: Size and appearance  within normal limits. Right Kidney: Length: 10 cm. Echogenicity within normal limits. No mass or hydronephrosis visualized. Left Kidney: Length: 11 cm. Echogenicity within normal limits. No mass or hydronephrosis visualized. Abdominal aorta: Only the proximal aorta could be seen due to bowel gas. No evidence of aneurysm. IMPRESSION: 1. Cholelithiasis without acute cholecystitis. 2. 12 mm hyperechoic lesion in the left liver favoring hemangioma. Suggest follow-up ultrasound in 6 months. 3. Limited visualization due to bowel gas and body habitus. Electronically Signed   By: Monte Fantasia M.D.   On: 08/12/2015 03:34    EKG & Cardiac Imaging    EKG:  NSR, HR 92, with no acute ST or T-wave changes noted.  Assessment & Plan    1. Chest Pain with mixed typical and atypical features - history of CAD s/p two BMS to [facility] in 2012. She reports 3 different types of chest pain, one very atypical described as a shooting pain lasting only 1-2 seconds, a RUQ pain worse with deep breathing occurring in the setting of acute cholecystitis, and a third pain which she says resembles prior pain when she required stent placement. The last one is most concerning from a cardiac standpoint, for she describes an associated fullness radiating into her neck. However, it can occur at rest or with exertion and came on after consuming a large meal last night. However, the pain is not always associated with food. - EKG shows no acute ischemic changes and cyclic troponin values are negative.  - will obtain a nuclear stress test for further evaluation. She will need to have a 2-day study secondary to weight. Unsure if this would be able to be obtained tomorrow due to having a HIDA scan today. Will need to call nuclear medicine tomorrow morning to verify (perhaps scan initially before tracer injection to see if any is still present).   2. Preoperative Clearance for Cholecystectomy - presents with CP as described as above. Two-day  nuclear stress test for further recommendations.  3. HLD -  will hold statin therapy in the setting of transaminitis. Resume once LFT's have normalized.  4. Tobacco Abuse - smoking cessation advised.   Signed, Erma Heritage, PA-C 08/12/2015, 2:57 PM Pager: [phone]  Attending Note:   The patient was seen and examined.  Agree with assessment and plan as noted above.  Changes made to the above note as needed.  Patient seen and independently examined with Bernerd Pho    We discussed all aspects of the encounter. I agree with the assessment and plan as stated above.  Pt has 3 different chest pain  - clearly has gall bladder pain . Also may have some angina like pain .  Needs to have a cholecystectomy. Will get a 2 day Dan Humphreys for further eval     I have spent a total of 40 minutes with patient reviewing hospital  notes , telemetry, EKGs, labs and examining patient as well as establishing an assessment and plan that was discussed with the patient. > 50% of time was spent in direct patient care.    Thayer Headings, Brooke Bonito., MD, [facility] 08/13/2015, 8:52 AM [address],  [place] Pager [phone]

## 2015-08-13 DIAGNOSIS — I2510 Atherosclerotic heart disease of native coronary artery without angina pectoris: Secondary | ICD-10-CM

## 2015-08-13 DIAGNOSIS — K8020 Calculus of gallbladder without cholecystitis without obstruction: Secondary | ICD-10-CM

## 2015-08-13 DIAGNOSIS — F329 Major depressive disorder, single episode, unspecified: Secondary | ICD-10-CM

## 2015-08-13 DIAGNOSIS — R079 Chest pain, unspecified: Secondary | ICD-10-CM | POA: Diagnosis present

## 2015-08-13 DIAGNOSIS — R0789 Other chest pain: Secondary | ICD-10-CM

## 2015-08-13 LAB — GLUCOSE, CAPILLARY
Glucose-Capillary: 111 mg/dL — ABNORMAL HIGH (ref 65–99)
Glucose-Capillary: 115 mg/dL — ABNORMAL HIGH (ref 65–99)
Glucose-Capillary: 139 mg/dL — ABNORMAL HIGH (ref 65–99)
Glucose-Capillary: 149 mg/dL — ABNORMAL HIGH (ref 65–99)
Glucose-Capillary: 164 mg/dL — ABNORMAL HIGH (ref 65–99)
Glucose-Capillary: 219 mg/dL — ABNORMAL HIGH (ref 65–99)

## 2015-08-13 LAB — COMPREHENSIVE METABOLIC PANEL
ALT: 62 U/L — ABNORMAL HIGH (ref 14–54)
AST: 44 U/L — ABNORMAL HIGH (ref 15–41)
Albumin: 3.2 g/dL — ABNORMAL LOW (ref 3.5–5.0)
Alkaline Phosphatase: 219 U/L — ABNORMAL HIGH (ref 38–126)
Anion gap: 7 (ref 5–15)
BUN: 8 mg/dL (ref 6–20)
CO2: 30 mmol/L (ref 22–32)
Calcium: 8.9 mg/dL (ref 8.9–10.3)
Chloride: 105 mmol/L (ref 101–111)
Creatinine, Ser: 0.67 mg/dL (ref 0.44–1.00)
GFR calc Af Amer: >60 mL/min (ref >60)
GFR calc non Af Amer: >60 mL/min (ref >60)
Glucose, Bld: 133 mg/dL — ABNORMAL HIGH (ref 65–99)
Potassium: 3.7 mmol/L (ref 3.5–5.1)
Sodium: 142 mmol/L (ref 135–145)
Total Bilirubin: 0.7 mg/dL (ref 0.3–1.2)
Total Protein: 6.3 g/dL — ABNORMAL LOW (ref 6.5–8.1)

## 2015-08-13 LAB — HEPATITIS B SURFACE ANTIGEN: Hepatitis B Surface Ag: NEGATIVE

## 2015-08-13 LAB — CBC
HCT: 40.9 % (ref 36.0–46.0)
Hemoglobin: 13.1 g/dL (ref 12.0–15.0)
MCH: 29.3 pg (ref 26.0–34.0)
MCHC: 32 g/dL (ref 30.0–36.0)
MCV: 91.5 fL (ref 78.0–100.0)
Platelets: 210 10*3/uL (ref 150–400)
RBC: 4.47 MIL/uL (ref 3.87–5.11)
RDW: 13.7 % (ref 11.5–15.5)
WBC: 9.3 10*3/uL (ref 4.0–10.5)

## 2015-08-13 LAB — HEPATITIS B CORE ANTIBODY, IGM: Hep B C IgM: NEGATIVE

## 2015-08-13 LAB — HEPATITIS B SURFACE ANTIBODY,QUALITATIVE: Hep B S Ab: NONREACTIVE

## 2015-08-13 MED ORDER — SODIUM CHLORIDE 0.9% FLUSH: 3.0000 mL | INTRAVENOUS | Status: DC | PRN

## 2015-08-13 MED ORDER — SODIUM CHLORIDE 0.9 % WEIGHT BASED INFUSION
3.0000 mL/kg/h | INTRAVENOUS | Status: DC
  Administered 2015-08-14: 3 mL/kg/h via INTRAVENOUS

## 2015-08-13 MED ORDER — SODIUM CHLORIDE 0.9 % WEIGHT BASED INFUSION: 1.0000 mL/kg/h | INTRAVENOUS | Status: DC

## 2015-08-13 MED ORDER — ASPIRIN 81 MG PO CHEW
81.0000 mg | CHEWABLE_TABLET | ORAL | Status: AC
  Administered 2015-08-14: 81 mg via ORAL
  Filled 2015-08-13: qty 1

## 2015-08-13 MED ORDER — SODIUM CHLORIDE 0.9% FLUSH
3.0000 mL | Freq: Two times a day (BID) | INTRAVENOUS | Status: DC
Start: 2015-08-13 — End: 2015-08-14
  Administered 2015-08-13: 3 mL via INTRAVENOUS

## 2015-08-13 MED ORDER — SODIUM CHLORIDE 0.9 % IV SOLN: 250.0000 mL | INTRAVENOUS | Status: DC | PRN

## 2015-08-13 NOTE — Progress Notes (Signed)
Subjective: Patient has no complaints this morning other than hunger and some sleepiness. She denies any pain, fevers, chills, nausea, or vomiting. She understands the plan for cardiac clearance with stress testing before surgical intervention for her cholecystitis.  Objective: Vital signs in last 24 hours: Filed Vitals:   08/12/15 2111 08/13/15 0512 08/13/15 1019 08/13/15 1325  BP: 151/76 132/74  115/55  Pulse:   98 66  Temp: 97.6 F (36.4 C) 97.6 F (36.4 C)  98.3 F (36.8 C)  TempSrc: Oral Oral  Oral  Resp:  20  18  Height:      Weight:  244 lb 9.6 oz (110.95 kg)    SpO2: 97% 96%  92%   Weight change:   Intake/Output Summary (Last 24 hours) at 08/13/15 1329 Last data filed at 08/13/15 1230  Gross per 24 hour  Intake    240 ml  Output      0 ml  Net    240 ml    General: obese Caucasian woman, resting in bed Cardiac: distant heart sounds, RRR, no rubs, murmurs or gallops Pulm: diminished breath sounds bilaterally Abd: soft, BS present, non-tender throughout abdomen with light palpation Ext: warm and well perfused, no pedal edema Neuro: alert oriented x 3  Assessment/Plan: Principal Problem:   Chest pain Active Problems:   Hypothyroidism   HLD (hyperlipidemia)   Depression   Essential hypertension   GERD   CAD (coronary artery disease)   Diabetes mellitus type 2 with complications (HCC)   Tobacco use disorder   Chronic cough   Symptomatic cholelithiasis   Elevated liver enzymes  Acute cholecystitis with cholelithiasis: Ab [place] 6/4 with cholelithiasis though common bile duct diameter within the upper limit of normal however study is limited due to large body habitus. HIDA scan confirms acute cholecystitis. Surgery consulted with plan for lap chole after cardiac clearance. -Continue Unasyn IV 3 grams q6h -Continue pantoprazole 40 mg twice daily -Follow-up HBV labwork -Surgery following, appreciate assistance  Chest pressure: May be in the setting of  symptomatic cholelithiasis though unclear if related to angina as it has been occurring at rest and without exertion. Cardiac cath 12/2010 notable for 99% mid RCA occlusion with placement of 2 bare metal stents. The stress test 2015 was unremarkable, she was to repeat in December 2016 which never happened. Troponins negative x3, EKG reassuring. Cardiology planning for diagnostic cardiac catheterization tomorrow rather than 2 day stress test. Will avoid revascularization if possible as to not delay cholecystectomy. -Cardiology following, appreciate assistance -Cardiac cath tomorrow -NPO at midnight, clear liquids for now  Hypothyroidism: TSH 10, elevated from 1.5 back in March 2017. Suspect there may be non-adherence as usual dosing is 1 microgram per kilogram. -Continue Synthroid 172.5 g for now though will have to assess adherence  Hyperlipidemia: Continue Lipitor 80 mg daily  Coronary artery disease s/p PCI [BMS in 12/2010]: Continue aspirin 69m.  Non-insulin-dependent type 2 diabetes: A1c 7.6, December 2016.  -Continue SSI-S  Depression: Continue sertraline 150 mg daily.  Hypertension: Blood pressures trending 110s-160s/70s-90s. -Continue losartan 25 mg daily -Continue metoprolol 100 mg daily  Diet: Clear liquid, NPO at midnight  DVT prophylaxis: Lovenox  CODE STATUS: FULL CODE  Dispo: Disposition is deferred at this time, awaiting improvement of current medical problems.   The patient does have a current PCP (Loleta Chance MD) and [facility].  The patient does not know have transportation limitations that hinder transportation to clinic appointments.  Zada Finders, MD 08/13/2015, 1:29 PM

## 2015-08-13 NOTE — Progress Notes (Signed)
Patient ID: Kendra Adams, female   DOB: 1960/12/19, 55 y.o.   MRN: [id number] Medicine attending: I examined this patient together with resident physician Dr. Zada Finders and I concur with his evaluation and management plan which we discussed together. We appreciate prompt surgical and cardiology consultations. She is clinically stable. Abdomen nontender. No new lab. She remains afebrile on empiric Unasyn. Plan for stress testing then decision on timing of cholecystectomy.

## 2015-08-13 NOTE — Progress Notes (Signed)
Patient Name: Kendra Adams Date of Encounter: 08/13/2015  Primary Cardiologist: Dr. Meda Coffee   Patient profile:  Kendra Adams is a 55 y.o. female with past medical history of CAD (s/p two BMS to [facility] in 2012), HTN, HLD, Type 2 DM, and GERD who presented to [facility] ED on 08/12/2015 for evaluation of chest pain, nausea, and vomiting for the past several days. Surgery plan lap chole pending cardiac clearance. Patient complained of chest pain as well. Pending myoview.    Principal Problem:   Chest pain Active Problems:   Hypothyroidism   HLD (hyperlipidemia)   Depression   Essential hypertension   GERD   CAD (coronary artery disease)   Diabetes mellitus type 2 with complications (HCC)   Tobacco use disorder   Chronic cough   Symptomatic cholelithiasis   Elevated liver enzymes    SUBJECTIVE  Denies any CP or SOB. No N/V  CURRENT MEDS . ampicillin-sulbactam (UNASYN) IV  3 g Intravenous Q6H  . aspirin  81 mg Oral Daily  . enoxaparin (LOVENOX) injection  60 mg Subcutaneous Q24H  . insulin aspart  0-9 Units Subcutaneous TID WC  . levothyroxine  150 mcg Oral QAC breakfast   And  . levothyroxine  12.5 mcg Oral QAC breakfast  . loratadine  10 mg Oral Daily  . losartan  25 mg Oral Daily  . metoprolol succinate  100 mg Oral Daily  . pantoprazole  40 mg Oral BID AC  . sertraline  150 mg Oral Daily  . sodium chloride flush  3 mL Intravenous Q12H    OBJECTIVE  Filed Vitals:   08/12/15 0819 08/12/15 1426 08/12/15 2111 08/13/15 0512  BP: 117/90 107/71 151/76 132/74  Pulse: 89 71    Temp: 97.7 F (36.5 C) 97.9 F (36.6 C) 97.6 F (36.4 C) 97.6 F (36.4 C)  TempSrc: Oral Oral Oral Oral  Resp: 20   20  Height: 5' 2"  (1.575 m)     Weight: 256 lb (116.121 kg)   244 lb 9.6 oz (110.95 kg)  SpO2: 96% 91% 97% 96%   No intake or output data in the 24 hours ending 08/13/15 0922 Filed Weights   08/12/15 0819 08/13/15 0512  Weight: 256 lb (116.121 kg) 244 lb 9.6 oz (110.95 kg)     PHYSICAL EXAM  General: Pleasant, NAD. Neuro: Alert and oriented X 3. Moves all extremities spontaneously. Psych: Normal affect. HEENT:  Normal  Neck: Supple without bruits or JVD. Lungs:  Resp regular and unlabored, CTA. Heart: RRR no s3, s4, or murmurs. Abdomen: Soft, non-tender, non-distended, BS + x 4.  Extremities: No clubbing, cyanosis or edema. DP/PT/Radials 2+ and equal bilaterally.  Accessory Clinical Findings  CBC  Recent Labs  08/12/15 0258 08/13/15 0306  WBC 10.9* 9.3  NEUTROABS 7.4  --   HGB 13.9 13.1  HCT 42.3 40.9  MCV 90.8 91.5  PLT 239 812   Basic Metabolic Panel  Recent Labs  08/12/15 0528 08/13/15 0306  NA 140 142  K 3.7 3.7  CL 103 105  CO2 26 30  GLUCOSE 142* 133*  BUN 9 8  CREATININE 0.76 0.67  CALCIUM 9.5 8.9   Liver Function Tests  Recent Labs  08/12/15 0528 08/13/15 0306  AST 159* 44*  ALT 92* 62*  ALKPHOS 286* 219*  BILITOT 0.6 0.7  PROT 6.6 6.3*  ALBUMIN 3.3* 3.2*    Recent Labs  08/12/15 0528  LIPASE 21   Cardiac Enzymes  Recent Labs  08/12/15 0528 08/12/15 1517 08/12/15 1856  TROPONINI <0.03 <0.03 <0.03   Thyroid Function Tests  Recent Labs  08/12/15 0543  TSH 10.530*    TELE NSR without significant ventricular ectopy    ECG  No new EKG   Radiology/Studies  Dg Chest 2 View  08/12/2015  CLINICAL DATA:  Acute onset of substernal chest pain and shortness of breath. Nausea and vomiting. Initial encounter. EXAM: CHEST  2 VIEW COMPARISON:  Chest radiograph performed 02/03/2015 FINDINGS: The lungs are well-aerated. Minimal bibasilar atelectasis is noted. Mild peribronchial thickening is seen. There is no evidence of pleural effusion or pneumothorax. The heart is normal in size; the mediastinal contour is within normal limits. No acute osseous abnormalities are seen. IMPRESSION: Minimal bibasilar atelectasis noted. Mild peribronchial thickening seen. Electronically Signed   By: Garald Balding M.D.    On: 08/12/2015 02:19   Nm Hepatobiliary Liver Func  08/12/2015  CLINICAL DATA:  Evaluate for acute cholecystitis. Intermittent right upper quadrant pain. EXAM: NUCLEAR MEDICINE HEPATOBILIARY IMAGING TECHNIQUE: Sequential images of the abdomen were obtained out to 60 minutes following intravenous administration of radiopharmaceutical. RADIOPHARMACEUTICALS:  5.4  mCi Tc-43m Choletec IV COMPARISON:  Ultrasound 08/12/2015 FINDINGS: Prompt uptake and biliary excretion of activity by the liver is seen. Biliary activity passes into small bowel, consistent with patent common bile duct. After 30 minutes of imaging no gallbladder activity was identified. Subsequently the patient received 4.6 mg of morphine an additional 1 millicurie of Choletec. Imaging was carried out for additional 30 minutes without visualization of the gallbladder. IMPRESSION: 1. Nonvisualization of the gallbladder following morphine administration compatible with cystic duct obstruction and acute cholecystitis. These results will be called to the ordering clinician or representative by the Radiologist Assistant, and communication documented in the PACS or zVision Dashboard. Electronically Signed   By: TKerby MoorsM.D.   On: 08/12/2015 15:00   [place]oreaAbdomen Complete  08/12/2015  CLINICAL DATA:  Epigastric and right upper quadrant pain for 1 week. EXAM: ABDOMEN ULTRASOUND COMPLETE COMPARISON:  None. FINDINGS: Gallbladder: Cholelithiasis without over distension or focal tenderness to suggest acute cholecystitis. Common bile duct: Diameter: 6 mm, upper limits of normal. Where visualized, no filling defect. Liver: 12 mm hyperechoic lesion in the upper left liver which is homogeneous appearing. No visible cirrhosis. IVC: Limited visualization is negative. Pancreas: Limited visualization. Either GDA or main duct is visualized at the head. If main duct, upper limits of normal at 3 mm. Spleen: Size and appearance within normal limits. Right Kidney: Length:  10 cm. Echogenicity within normal limits. No mass or hydronephrosis visualized. Left Kidney: Length: 11 cm. Echogenicity within normal limits. No mass or hydronephrosis visualized. Abdominal aorta: Only the proximal aorta could be seen due to bowel gas. No evidence of aneurysm. IMPRESSION: 1. Cholelithiasis without acute cholecystitis. 2. 12 mm hyperechoic lesion in the left liver favoring hemangioma. Suggest follow-up ultrasound in 6 months. 3. Limited visualization due to bowel gas and body habitus. Electronically Signed   By: JMonte FantasiaM.D.   On: 08/12/2015 03:34    ASSESSMENT AND PLAN  1. Chest pain with mixed typical and atypical features  - [facility]   - plan for myoview, originally ordered 2 day myoview starting today, however patient had HIDA scan yesterday and unable to have another nuclear study for 48 hours, earliest time is tomorrow. Will discuss with MD regarding whether can change her study to 1 day study tomorrow, her weight  is 244 today (although 256 yesterday), cut off for female is 225 lbs.   2. Preoperative clearance for lap chole: pending myoview given CP above  3. HLD: hold statin therapy in the setting of transaminitis.   4. Tobacco abuse  Signed, Almyra Deforest PA-C Pager: [phone]  I have examined the patient and reviewed assessment and plan and discussed with patient.  Agree with above as stated.   Discussed symptoms at length with the patient. She has had some recurrence of angina type pain. She had a negative stress test in 2015. Her discomfort is occurring at rest. Given her negative troponin negative ECG, I doubt ischemia; however, her symptoms are somewhat concerning. Would recommend diagnostic cardiac catheterization rather than 2 day nuclear stress test. However, she will need cholecystectomy. Would only plan revascularization if there is a high risk lesion that would affect her long-term outcome. Revascularization for more optional  disease would potentially delay her cholecystectomy. Her primary symptom coming and was clearly gallbladder pain.  Cardiac catheterization procedure was explained to the patient and all questions were answered.  Larae Grooms

## 2015-08-13 NOTE — Progress Notes (Signed)
Subjective: Denies pain   Objective: Vital signs in last 24 hours: Temp:  [97.6 F (36.4 C)-97.9 F (36.6 C)] 97.6 F (36.4 C) (06/05 0512) Pulse Rate:  [71-89] 71 (06/04 1426) Resp:  [20] 20 (06/05 0512) BP: (107-151)/(71-90) 132/74 mmHg (06/05 0512) SpO2:  [91 %-97 %] 96 % (06/05 0512) Weight:  [110.95 kg (244 lb 9.6 oz)-116.121 kg (256 lb)] 110.95 kg (244 lb 9.6 oz) (06/05 0512) Last BM Date: 08/12/15  Intake/Output from previous day:   Intake/Output this shift:    GI: soft, non-tender; bowel sounds normal; no masses,  no organomegaly  Lab Results:   Recent Labs  08/12/15 0258 08/13/15 0306  WBC 10.9* 9.3  HGB 13.9 13.1  HCT 42.3 40.9  PLT 239 210   BMET  Recent Labs  08/12/15 0528 08/13/15 0306  NA 140 142  K 3.7 3.7  CL 103 105  CO2 26 30  GLUCOSE 142* 133*  BUN 9 8  CREATININE 0.76 0.67  CALCIUM 9.5 8.9   PT/INR No results for input(s): LABPROT, INR in the last 72 hours. ABG No results for input(s): PHART, HCO3 in the last 72 hours.  Invalid input(s): PCO2, PO2  Studies/Results: Dg Chest 2 View  08/12/2015  CLINICAL DATA:  Acute onset of substernal chest pain and shortness of breath. Nausea and vomiting. Initial encounter. EXAM: CHEST  2 VIEW COMPARISON:  Chest radiograph performed 02/03/2015 FINDINGS: The lungs are well-aerated. Minimal bibasilar atelectasis is noted. Mild peribronchial thickening is seen. There is no evidence of pleural effusion or pneumothorax. The heart is normal in size; the mediastinal contour is within normal limits. No acute osseous abnormalities are seen. IMPRESSION: Minimal bibasilar atelectasis noted. Mild peribronchial thickening seen. Electronically Signed   By: Garald Balding M.D.   On: 08/12/2015 02:19   Nm Hepatobiliary Liver Func  08/12/2015  CLINICAL DATA:  Evaluate for acute cholecystitis. Intermittent right upper quadrant pain. EXAM: NUCLEAR MEDICINE HEPATOBILIARY IMAGING TECHNIQUE: Sequential images of the  abdomen were obtained out to 60 minutes following intravenous administration of radiopharmaceutical. RADIOPHARMACEUTICALS:  5.4  mCi Tc-60m Choletec IV COMPARISON:  Ultrasound 08/12/2015 FINDINGS: Prompt uptake and biliary excretion of activity by the liver is seen. Biliary activity passes into small bowel, consistent with patent common bile duct. After 30 minutes of imaging no gallbladder activity was identified. Subsequently the patient received 4.6 mg of morphine an additional 1 millicurie of Choletec. Imaging was carried out for additional 30 minutes without visualization of the gallbladder. IMPRESSION: 1. Nonvisualization of the gallbladder following morphine administration compatible with cystic duct obstruction and acute cholecystitis. These results will be called to the ordering clinician or representative by the Radiologist Assistant, and communication documented in the PACS or zVision Dashboard. Electronically Signed   By: TKerby MoorsM.D.   On: 08/12/2015 15:00   [place]domen Complete  08/12/2015  CLINICAL DATA:  Epigastric and right upper quadrant pain for 1 week. EXAM: ABDOMEN ULTRASOUND COMPLETE COMPARISON:  None. FINDINGS: Gallbladder: Cholelithiasis without over distension or focal tenderness to suggest acute cholecystitis. Common bile duct: Diameter: 6 mm, upper limits of normal. Where visualized, no filling defect. Liver: 12 mm hyperechoic lesion in the upper left liver which is homogeneous appearing. No visible cirrhosis. IVC: Limited visualization is negative. Pancreas: Limited visualization. Either GDA or main duct is visualized at the head. If main duct, upper limits of normal at 3 mm. Spleen: Size and appearance within normal limits. Right Kidney: Length: 10 cm. Echogenicity within normal limits. No mass  or hydronephrosis visualized. Left Kidney: Length: 11 cm. Echogenicity within normal limits. No mass or hydronephrosis visualized. Abdominal aorta: Only the proximal aorta could be seen  due to bowel gas. No evidence of aneurysm. IMPRESSION: 1. Cholelithiasis without acute cholecystitis. 2. 12 mm hyperechoic lesion in the left liver favoring hemangioma. Suggest follow-up ultrasound in 6 months. 3. Limited visualization due to bowel gas and body habitus. Electronically Signed   By: Monte Fantasia M.D.   On: 08/12/2015 03:34    Anti-infectives: Anti-infectives    Start     Dose/Rate Route Frequency Ordered Stop   08/12/15 1700  Ampicillin-Sulbactam (UNASYN) 3 g in sodium chloride 0.9 % 100 mL IVPB     3 g 100 mL/hr over 60 Minutes Intravenous Every 6 hours 08/12/15 1613        Assessment/Plan: Patient Active Problem List   Diagnosis Date Noted  . Chest pain 08/12/2015  . Symptomatic cholelithiasis 08/12/2015  . Elevated liver enzymes 08/12/2015  . Positive fecal occult blood test 07/10/2015  . Injury of toe on left foot 07/05/2015  . Chronic cough 05/16/2015  . Rash and nonspecific skin eruption 05/16/2015  . Tendinitis of left rotator cuff 01/19/2015  . Tobacco use disorder 09/07/2014  . Obesity 03/19/2011  . Routine health maintenance 02/03/2011  . CAD (coronary artery disease) 12/26/2010  . Diabetes mellitus type 2 with complications ([place]) 83/77/9396  . Essential hypertension 05/10/2008  . Hypothyroidism 04/27/2008  . HLD (hyperlipidemia) 04/27/2008  . Depression 04/27/2008  . GERD 04/27/2008    Lap chole IOC once cardiac clearance  Not tender today on exam Discussed procedure with the patient and possible timing.       Kendra Doublin A. 08/13/2015

## 2015-08-14 ENCOUNTER — Encounter: Payer: Self-pay | Admitting: Pharmacist

## 2015-08-14 ENCOUNTER — Encounter (HOSPITAL_COMMUNITY)
Admission: EM | Disposition: A | Discharge status: Home / Self Care | Payer: Self-pay | Source: Home / Self Care | Attending: Oncology

## 2015-08-14 ENCOUNTER — Encounter (HOSPITAL_COMMUNITY): Payer: Self-pay | Admitting: Cardiology

## 2015-08-14 DIAGNOSIS — Z9889 Other specified postprocedural states: Secondary | ICD-10-CM

## 2015-08-14 DIAGNOSIS — K8000 Calculus of gallbladder with acute cholecystitis without obstruction: Secondary | ICD-10-CM

## 2015-08-14 DIAGNOSIS — R079 Chest pain, unspecified: Secondary | ICD-10-CM | POA: Insufficient documentation

## 2015-08-14 HISTORY — PX: CARDIAC CATHETERIZATION: SHX172

## 2015-08-14 LAB — CBC
HCT: 39.9 % (ref 36.0–46.0)
Hemoglobin: 12.9 g/dL (ref 12.0–15.0)
MCH: 29.6 pg (ref 26.0–34.0)
MCHC: 32.3 g/dL (ref 30.0–36.0)
MCV: 91.5 fL (ref 78.0–100.0)
Platelets: 224 10*3/uL (ref 150–400)
RBC: 4.36 MIL/uL (ref 3.87–5.11)
RDW: 13.2 % (ref 11.5–15.5)
WBC: 7.4 10*3/uL (ref 4.0–10.5)

## 2015-08-14 LAB — GLUCOSE, CAPILLARY
Glucose-Capillary: 127 mg/dL — ABNORMAL HIGH (ref 65–99)
Glucose-Capillary: 136 mg/dL — ABNORMAL HIGH (ref 65–99)
Glucose-Capillary: 137 mg/dL — ABNORMAL HIGH (ref 65–99)
Glucose-Capillary: 145 mg/dL — ABNORMAL HIGH (ref 65–99)
Glucose-Capillary: 150 mg/dL — ABNORMAL HIGH (ref 65–99)
Glucose-Capillary: 167 mg/dL — ABNORMAL HIGH (ref 65–99)

## 2015-08-14 LAB — COMPREHENSIVE METABOLIC PANEL
ALT: 47 U/L (ref 14–54)
AST: 27 U/L (ref 15–41)
Albumin: 3.4 g/dL — ABNORMAL LOW (ref 3.5–5.0)
Alkaline Phosphatase: 185 U/L — ABNORMAL HIGH (ref 38–126)
Anion gap: 8 (ref 5–15)
BUN: 6 mg/dL (ref 6–20)
CO2: 28 mmol/L (ref 22–32)
Calcium: 9.3 mg/dL (ref 8.9–10.3)
Chloride: 103 mmol/L (ref 101–111)
Creatinine, Ser: 0.63 mg/dL (ref 0.44–1.00)
GFR calc Af Amer: >60 mL/min (ref >60)
GFR calc non Af Amer: >60 mL/min (ref >60)
Glucose, Bld: 130 mg/dL — ABNORMAL HIGH (ref 65–99)
Potassium: 3.6 mmol/L (ref 3.5–5.1)
Sodium: 139 mmol/L (ref 135–145)
Total Bilirubin: 0.7 mg/dL (ref 0.3–1.2)
Total Protein: 6.6 g/dL (ref 6.5–8.1)

## 2015-08-14 LAB — CREATININE, SERUM
Creatinine, Ser: 0.58 mg/dL (ref 0.44–1.00)
GFR calc Af Amer: >60 mL/min (ref >60)
GFR calc non Af Amer: >60 mL/min (ref >60)

## 2015-08-14 LAB — PROTIME-INR
INR: 1.06 (ref 0.00–1.49)
Prothrombin Time: 14 seconds (ref 11.6–15.2)

## 2015-08-14 SURGERY — LEFT HEART CATH AND CORONARY ANGIOGRAPHY
Anesthesia: LOCAL

## 2015-08-14 MED ORDER — HEPARIN (PORCINE) IN NACL 2-0.9 UNIT/ML-% IJ SOLN
INTRAMUSCULAR | Status: AC
  Filled 2015-08-14: qty 1000

## 2015-08-14 MED ORDER — ENOXAPARIN SODIUM 40 MG/0.4ML ~~LOC~~ SOLN: 40.0000 mg | SUBCUTANEOUS | Status: DC

## 2015-08-14 MED ORDER — ROPINIROLE HCL 1 MG PO TABS
1.0000 mg | ORAL_TABLET | Freq: Every evening | ORAL | Status: DC | PRN
  Administered 2015-08-14 (×2): 1 mg via ORAL
  Filled 2015-08-14 (×2): qty 1

## 2015-08-14 MED ORDER — FENTANYL CITRATE (PF) 100 MCG/2ML IJ SOLN
INTRAMUSCULAR | Status: DC | PRN
  Administered 2015-08-14: 25 ug via INTRAVENOUS

## 2015-08-14 MED ORDER — POTASSIUM CHLORIDE IN NACL 20-0.9 MEQ/L-% IV SOLN
INTRAVENOUS | Status: DC
  Administered 2015-08-14 – 2015-08-16 (×3): via INTRAVENOUS
  Filled 2015-08-14 (×4): qty 1000

## 2015-08-14 MED ORDER — IOPAMIDOL (ISOVUE-370) INJECTION 76%
INTRAVENOUS | Status: DC | PRN
  Administered 2015-08-14: 50 mL via INTRAVENOUS

## 2015-08-14 MED ORDER — HEPARIN SODIUM (PORCINE) 1000 UNIT/ML IJ SOLN
INTRAMUSCULAR | Status: DC | PRN
  Administered 2015-08-14: 5000 [IU] via INTRAVENOUS

## 2015-08-14 MED ORDER — LIDOCAINE HCL (PF) 1 % IJ SOLN
INTRAMUSCULAR | Status: AC
  Filled 2015-08-14: qty 30

## 2015-08-14 MED ORDER — SODIUM CHLORIDE 0.9% FLUSH: 3.0000 mL | INTRAVENOUS | Status: DC | PRN

## 2015-08-14 MED ORDER — LIDOCAINE HCL (PF) 1 % IJ SOLN
INTRAMUSCULAR | Status: DC | PRN
  Administered 2015-08-14: 3 mL

## 2015-08-14 MED ORDER — VERAPAMIL HCL 2.5 MG/ML IV SOLN
INTRAVENOUS | Status: AC
  Filled 2015-08-14: qty 2

## 2015-08-14 MED ORDER — IOPAMIDOL (ISOVUE-370) INJECTION 76%
INTRAVENOUS | Status: AC
  Filled 2015-08-14: qty 100

## 2015-08-14 MED ORDER — MIDAZOLAM HCL 2 MG/2ML IJ SOLN
INTRAMUSCULAR | Status: AC
  Filled 2015-08-14: qty 2

## 2015-08-14 MED ORDER — HEPARIN (PORCINE) IN NACL 2-0.9 UNIT/ML-% IJ SOLN
INTRAMUSCULAR | Status: DC | PRN
  Administered 2015-08-14: 12:00:00 via INTRA_ARTERIAL

## 2015-08-14 MED ORDER — HEPARIN (PORCINE) IN NACL 2-0.9 UNIT/ML-% IJ SOLN
INTRAMUSCULAR | Status: DC | PRN
  Administered 2015-08-14: 1000 mL

## 2015-08-14 MED ORDER — SODIUM CHLORIDE 0.9 % IV SOLN: 250.0000 mL | INTRAVENOUS | Status: DC | PRN

## 2015-08-14 MED ORDER — SODIUM CHLORIDE 0.9 % WEIGHT BASED INFUSION
1.0000 mL/kg/h | INTRAVENOUS | Status: DC
Start: 2015-08-14 — End: 2015-08-15
  Administered 2015-08-14: 1 mL/kg/h via INTRAVENOUS

## 2015-08-14 MED ORDER — HEPARIN SODIUM (PORCINE) 1000 UNIT/ML IJ SOLN
INTRAMUSCULAR | Status: AC
  Filled 2015-08-14: qty 1

## 2015-08-14 MED ORDER — MIDAZOLAM HCL 2 MG/2ML IJ SOLN
INTRAMUSCULAR | Status: DC | PRN
  Administered 2015-08-14: 1 mg via INTRAVENOUS

## 2015-08-14 MED ORDER — SODIUM CHLORIDE 0.9% FLUSH
3.0000 mL | Freq: Two times a day (BID) | INTRAVENOUS | Status: DC
  Administered 2015-08-14: 3 mL via INTRAVENOUS

## 2015-08-14 MED ORDER — POTASSIUM CHLORIDE IN NACL 20-0.9 MEQ/L-% IV SOLN: INTRAVENOUS | Status: DC

## 2015-08-14 MED ORDER — FENTANYL CITRATE (PF) 100 MCG/2ML IJ SOLN
INTRAMUSCULAR | Status: AC
  Filled 2015-08-14: qty 2

## 2015-08-14 SURGICAL SUPPLY — 11 items
CATH INFINITI 5 FR JL3.5 (CATHETERS) ×2
CATH INFINITI 5FR ANG PIGTAIL (CATHETERS) ×2
CATH INFINITI JR4 5F (CATHETERS) ×2
DEVICE RAD COMP TR BAND LRG (VASCULAR PRODUCTS) ×2
GLIDESHEATH SLEND SS 6F .021 (SHEATH) ×2
KIT HEART LEFT (KITS) ×2
PACK CARDIAC CATHETERIZATION (CUSTOM PROCEDURE TRAY) ×2
SYR MEDRAD MARK V 150ML (SYRINGE) ×2
TRANSDUCER W/STOPCOCK (MISCELLANEOUS) ×2
TUBING CIL FLEX 10 FLL-RA (TUBING) ×2
WIRE SAFE-T 1.5MM-J .035X260CM (WIRE) ×2

## 2015-08-14 NOTE — Interval H&P Note (Signed)
History and Physical Interval Note:  08/14/2015 11:20 AM  Wilburt Finlay  has presented today for surgery, with the diagnosis of chest pain, pre op  The various methods of treatment have been discussed with the patient and family. After consideration of risks, benefits and other options for treatment, the patient has consented to  Procedure(s): Left Heart Cath and Coronary Angiography (N/A) as a surgical intervention .  The patient's history has been reviewed, patient examined, no change in status, stable for surgery.  I have reviewed the patient's chart and labs.  Questions were answered to the patient's satisfaction.   Cath Lab Visit (complete for each Cath Lab visit)  Clinical Evaluation Leading to the Procedure:   ACS: No.  Non-ACS:    Anginal Classification: CCS III  Anti-ischemic medical therapy: No Therapy  Non-Invasive Test Results: No non-invasive testing performed  Prior CABG: No previous CABG        Peter Martinique MD,FACC 08/14/2015 11:20 AM

## 2015-08-14 NOTE — Anesthesia Preprocedure Evaluation (Addendum)
Anesthesia Evaluation  Patient identified by MRN, date of birth, ID band Patient awake    Reviewed: Allergy & Precautions, NPO status , Patient's Chart, lab work & pertinent test results  Airway Mallampati: I  TM Distance: >3 FB Neck ROM: full    Dental  (+) Dental Advidsory Given, Edentulous Upper, Edentulous Lower   Pulmonary former smoker,    breath sounds clear to auscultation       Cardiovascular hypertension, Pt. on medications and Pt. on home beta blockers + CAD and + Cardiac Stents   Rhythm:Regular Rate:Normal  08/14/15: LHC. Nl LV function. Non-obst CAD. Patent stent.   Neuro/Psych PSYCHIATRIC DISORDERS Depression negative neurological ROS     GI/Hepatic Neg liver ROS, GERD  Medicated and Controlled,  Endo/Other  diabetes, Type 2, Insulin DependentHypothyroidism Morbid obesity  Renal/GU negative Renal ROS     Musculoskeletal  (+) Arthritis ,   Abdominal   Peds  Hematology negative hematology ROS (+)   Anesthesia Other Findings   Reproductive/Obstetrics                          Lab Results  Component Value Date   WBC 7.4 08/14/2015   HGB 12.9 08/14/2015   HCT 39.9 08/14/2015   MCV 91.5 08/14/2015   PLT 224 08/14/2015   Lab Results  Component Value Date   CREATININE 0.58 08/14/2015   BUN 6 08/14/2015   NA 139 08/14/2015   K 3.6 08/14/2015   CL 103 08/14/2015   CO2 28 08/14/2015    Anesthesia Physical Anesthesia Plan  ASA: III  Anesthesia Plan: General   Post-op Pain Management:    Induction: Intravenous  Airway Management Planned: Oral ETT  Additional Equipment:   Intra-op Plan:   Post-operative Plan: Extubation in OR  Informed Consent:   Dental Advisory Given  Plan Discussed with: Anesthesiologist  Anesthesia Plan Comments:        Anesthesia Quick Evaluation

## 2015-08-14 NOTE — Progress Notes (Signed)
Subjective: Not eating and no real pain without PO intake.    Objective: Vital signs in last 24 hours: Temp:  [98.3 F (36.8 C)] 98.3 F (36.8 C) (06/06 0455) Pulse Rate:  [66-98] 72 (06/06 0911) Resp:  [18] 18 (06/06 0455) BP: (115-118)/(55-85) 118/85 mmHg (06/06 0911) SpO2:  [92 %-93 %] 93 % (06/06 0455) Weight:  [111.902 kg (246 lb 11.2 oz)] 111.902 kg (246 lb 11.2 oz) (06/06 0455) Last BM Date: 08/13/15 600 PO BM x 1 Urine not listed Afebrile, VSS Labs OK HIDA:  . Nonvisualization of the gallbladder following morphine administration compatible with cystic duct obstruction and acute cholecystitis.  Intake/Output from previous day: 06/05 0701 - 06/06 0700 In: 600 [P.O.:600] Out: -  Intake/Output this shift:    General appearance: alert, cooperative and no distress GI: not really tender without PO.  no distension.  Lab Results:   Recent Labs  08/12/15 0258 08/13/15 0306  WBC 10.9* 9.3  HGB 13.9 13.1  HCT 42.3 40.9  PLT 239 210    BMET  Recent Labs  08/13/15 0306 08/14/15 0330  NA 142 139  K 3.7 3.6  CL 105 103  CO2 30 28  GLUCOSE 133* 130*  BUN 8 6  CREATININE 0.67 0.63  CALCIUM 8.9 9.3   PT/INR  Recent Labs  08/14/15 0516  LABPROT 14.0  INR 1.06     Recent Labs Lab 08/12/15 0258 08/12/15 0528 08/13/15 0306 08/14/15 0330  AST 177* 159* 44* 27  ALT 79* 92* 62* 47  ALKPHOS 290* 286* 219* 185*  BILITOT 0.7 0.6 0.7 0.7  PROT 6.7 6.6 6.3* 6.6  ALBUMIN 3.3* 3.3* 3.2* 3.4*     Lipase     Component Value Date/Time   LIPASE 21 08/12/2015 0528     Studies/Results: Nm Hepatobiliary Liver Func  08/12/2015  CLINICAL DATA:  Evaluate for acute cholecystitis. Intermittent right upper quadrant pain. EXAM: NUCLEAR MEDICINE HEPATOBILIARY IMAGING TECHNIQUE: Sequential images of the abdomen were obtained out to 60 minutes following intravenous administration of radiopharmaceutical. RADIOPHARMACEUTICALS:  5.4  mCi Tc-57m Choletec IV  COMPARISON:  Ultrasound 08/12/2015 FINDINGS: Prompt uptake and biliary excretion of activity by the liver is seen. Biliary activity passes into small bowel, consistent with patent common bile duct. After 30 minutes of imaging no gallbladder activity was identified. Subsequently the patient received 4.6 mg of morphine an additional 1 millicurie of Choletec. Imaging was carried out for additional 30 minutes without visualization of the gallbladder. IMPRESSION: 1. Nonvisualization of the gallbladder following morphine administration compatible with cystic duct obstruction and acute cholecystitis. These results will be called to the ordering clinician or representative by the Radiologist Assistant, and communication documented in the PACS or zVision Dashboard. Electronically Signed   By: TKerby MoorsM.D.   On: 08/12/2015 15:00    Medications: . ampicillin-sulbactam (UNASYN) IV  3 g Intravenous Q6H  . aspirin  81 mg Oral Daily  . enoxaparin (LOVENOX) injection  60 mg Subcutaneous Q24H  . insulin aspart  0-9 Units Subcutaneous TID WC  . levothyroxine  150 mcg Oral QAC breakfast   And  . levothyroxine  12.5 mcg Oral QAC breakfast  . loratadine  10 mg Oral Daily  . losartan  25 mg Oral Daily  . metoprolol succinate  100 mg Oral Daily  . pantoprazole  40 mg Oral BID AC  . sertraline  150 mg Oral Daily  . sodium chloride flush  3 mL Intravenous Q12H  . sodium chloride flush  3 mL Intravenous Q12H   . sodium chloride 1 mL/kg/hr (08/14/15 0727)   Assessment/Plan Acute cholecystitis/cholelithiasis CAD/chest pain, awaiting cardiac clearance Hypothyroid AODM Hypertension Dyslipidemia Body mass index is 45. Depression FEN:  NPO/IV fluids ID:  Day 3 Unasyn DVT:  Lovenox   Plan:: cholecystectomy once she is cleared by Cards.  Cardiac cath today.       LOS: 1 day    Kendra Adams 08/14/2015 [id number]

## 2015-08-14 NOTE — Progress Notes (Addendum)
Patient ID: Kendra Adams, female   DOB: 05-29-1960, 55 y.o.   MRN: [id number]  Patient stated her levothyroxine was too expensive so she transferred from [facility] to [facility]. Last filled at [facility] 08/02/15 for 30-day supply. Advised patient to contact me if further assistance needed and she verbalized understanding.  If lower cost needed on levothyroxine, can consider [facility] $4.

## 2015-08-14 NOTE — Progress Notes (Signed)
   Subjective: Patient has no complaints this morning and no acute events overnight. Going for cardiac cath today.  Objective: Vital signs in last 24 hours: Filed Vitals:   08/14/15 1134 08/14/15 1139 08/14/15 1144 08/14/15 1149  BP: 132/79 137/79 138/77 143/88  Pulse: 74 80 75 71  Temp:      TempSrc:      Resp: 12 11 13 12   Height:      Weight:      SpO2: 97% 99% 96% 93%   Weight change: -9 lb 4.8 oz (-4.218 kg)  Intake/Output Summary (Last 24 hours) at 08/14/15 1200 Last data filed at 08/14/15 0800  Gross per 24 hour  Intake    600 ml  Output      0 ml  Net    600 ml    General: obese Caucasian woman, sitting up in bed Cardiac: distant heart sounds, RRR, no rubs, murmurs or gallops Pulm: diminished breath sounds bilaterally Abd: soft, non-tender Ext: warm and well perfused, no pedal edema Neuro: alert oriented x 3  Assessment/Plan: Principal Problem:   Acute calculous cholecystitis Active Problems:   Hypothyroidism   HLD (hyperlipidemia)   Depression   Hypertension associated with diabetes ([facility])   GERD   CAD (coronary artery disease)   Diabetes mellitus type 2 with complications ([facility])   Morbid obesity ([facility])   Tobacco use disorder   Elevated liver enzymes   Chest pain   Pain in the chest  Acute cholecystitis with cholelithiasis: Ab [place] 6/4 with cholelithiasis though common bile duct diameter within the upper limit of normal however study is limited due to large body habitus. HIDA scan confirms acute cholecystitis. Surgery consulted with plan for lap chole after cardiac clearance. -Continue Unasyn IV 3 grams q6h -Continue pantoprazole 40 mg twice daily -Hep B core antibody, surface antigen, and surface antibody are negative -Surgery following, appreciate assistance  Chest pressure: May be in the setting of symptomatic cholelithiasis though unclear if related to angina as it has been occurring at rest and without exertion. Cardiac cath 12/2010 notable for 99%  mid RCA occlusion with placement of 2 bare metal stents. The stress test 2015 was unremarkable, she was to repeat in December 2016 which never happened. Troponins negative x3, EKG reassuring. Going for cardiac cath today. Will avoid revascularization if possible as to not delay cholecystectomy. -Cardiology following, appreciate assistance -Cardiac cath today -NPO at midnight for possible lap chole tomorrow if cleared  Hypothyroidism: TSH 10, elevated from 1.5 back in March 2017. Suspect there may be non-adherence as usual dosing is 1 microgram per kilogram. -Continue Synthroid 172.5 g for now though will have to assess adherence  Hyperlipidemia: Continue Lipitor 80 mg daily  Coronary artery disease s/p PCI [BMS in 12/2010]: Continue aspirin 73m.  Non-insulin-dependent type 2 diabetes: A1c 7.6, December 2016.  -Continue SSI-S  Depression: Continue sertraline 150 mg daily.  Hypertension: Blood pressures trending 110s-160s/70s-90s. -Continue losartan 25 mg daily -Continue metoprolol 100 mg daily  Diet: NPO at midnight  DVT prophylaxis: Lovenox  CODE STATUS: FULL CODE  Dispo: Disposition is deferred at this time, awaiting improvement of current medical problems.   The patient does have a current PCP (Loleta Chance MD) and [facility]  The patient does not know have transportation limitations that hinder transportation to clinic appointments.    LOS: 1 day   VZada Finders MD 08/14/2015, 12:00 PM

## 2015-08-14 NOTE — Progress Notes (Signed)
SUBJECTIVE:  Feels well  OBJECTIVE:   Vitals:   Filed Vitals:   08/14/15 1235 08/14/15 1250 08/14/15 1305 08/14/15 1426  BP: 122/73 130/87 131/77 133/87  Pulse:    67  Temp:    97.7 F (36.5 C)  TempSrc:    Oral  Resp:    18  Height:      Weight:      SpO2:    94%   I&O's:   Intake/Output Summary (Last 24 hours) at 08/14/15 1442 Last data filed at 08/14/15 1426  Gross per 24 hour  Intake    720 ml  Output    301 ml  Net    419 ml   TELEMETRY: Reviewed telemetry pt in NSR:     PHYSICAL EXAM General: Well developed, well nourished, in no acute distress Head:   Normal cephalic and atramatic  Lungs:   Clear bilaterally to auscultation. Heart:   HRRR S1 S2  No JVD.   Abdomen: abdomen soft and non-tender Msk:  Back normal,  Normal strength and tone for age. Extremities:   No edema.  No radial hematoma Neuro: Alert and oriented. Psych:  Normal affect, responds appropriately Skin: No rash   LABS: Basic Metabolic Panel:  Recent Labs  08/13/15 0306 08/14/15 0330 08/14/15 1236  NA 142 139  --   K 3.7 3.6  --   CL 105 103  --   CO2 30 28  --   GLUCOSE 133* 130*  --   BUN 8 6  --   CREATININE 0.67 0.63 0.58  CALCIUM 8.9 9.3  --    Liver Function Tests:  Recent Labs  08/13/15 0306 08/14/15 0330  AST 44* 27  ALT 62* 47  ALKPHOS 219* 185*  BILITOT 0.7 0.7  PROT 6.3* 6.6  ALBUMIN 3.2* 3.4*    Recent Labs  08/12/15 0528  LIPASE 21   CBC:  Recent Labs  08/12/15 0258 08/13/15 0306 08/14/15 1236  WBC 10.9* 9.3 7.4  NEUTROABS 7.4  --   --   HGB 13.9 13.1 12.9  HCT 42.3 40.9 39.9  MCV 90.8 91.5 91.5  PLT 239 210 224   Cardiac Enzymes:  Recent Labs  08/12/15 0528 08/12/15 1517 08/12/15 1856  TROPONINI <0.03 <0.03 <0.03   BNP: Invalid input(s): POCBNP D-Dimer: No results for input(s): DDIMER in the last 72 hours. Hemoglobin A1C: No results for input(s): HGBA1C in the last 72 hours. Fasting Lipid Panel: No results for input(s):  CHOL, HDL, LDLCALC, TRIG, CHOLHDL, LDLDIRECT in the last 72 hours. Thyroid Function Tests:  Recent Labs  08/12/15 0543  TSH 10.530*   Anemia Panel: No results for input(s): VITAMINB12, FOLATE, FERRITIN, TIBC, IRON, RETICCTPCT in the last 72 hours. Coag Panel:   Lab Results  Component Value Date   INR 1.06 08/14/2015   INR 1.00 12/12/2010    RADIOLOGY: Dg Chest 2 View  08/12/2015  CLINICAL DATA:  Acute onset of substernal chest pain and shortness of breath. Nausea and vomiting. Initial encounter. EXAM: CHEST  2 VIEW COMPARISON:  Chest radiograph performed 02/03/2015 FINDINGS: The lungs are well-aerated. Minimal bibasilar atelectasis is noted. Mild peribronchial thickening is seen. There is no evidence of pleural effusion or pneumothorax. The heart is normal in size; the mediastinal contour is within normal limits. No acute osseous abnormalities are seen. IMPRESSION: Minimal bibasilar atelectasis noted. Mild peribronchial thickening seen. Electronically Signed   By: Garald Balding M.D.   On: 08/12/2015 02:19   Nm Hepatobiliary  Liver Func  08/12/2015  CLINICAL DATA:  Evaluate for acute cholecystitis. Intermittent right upper quadrant pain. EXAM: NUCLEAR MEDICINE HEPATOBILIARY IMAGING TECHNIQUE: Sequential images of the abdomen were obtained out to 60 minutes following intravenous administration of radiopharmaceutical. RADIOPHARMACEUTICALS:  5.4  mCi Tc-59m Choletec IV COMPARISON:  Ultrasound 08/12/2015 FINDINGS: Prompt uptake and biliary excretion of activity by the liver is seen. Biliary activity passes into small bowel, consistent with patent common bile duct. After 30 minutes of imaging no gallbladder activity was identified. Subsequently the patient received 4.6 mg of morphine an additional 1 millicurie of Choletec. Imaging was carried out for additional 30 minutes without visualization of the gallbladder. IMPRESSION: 1. Nonvisualization of the gallbladder following morphine administration  compatible with cystic duct obstruction and acute cholecystitis. These results will be called to the ordering clinician or representative by the Radiologist Assistant, and communication documented in the PACS or zVision Dashboard. Electronically Signed   By: TKerby MoorsM.D.   On: 08/12/2015 15:00   [place]dom[place] Complete  08/12/2015  CLINICAL DATA:  Epigastric and right upper quadrant pain for 1 week. EXAM: ABDOMEN ULTRASOUND COMPLETE COMPARISON:  None. FINDINGS: Gallbladder: Cholelithiasis without over distension or focal tenderness to suggest acute cholecystitis. Common bile duct: Diameter: 6 mm, upper limits of normal. Where visualized, no filling defect. Liver: 12 mm hyperechoic lesion in the upper left liver which is homogeneous appearing. No visible cirrhosis. IVC: Limited visualization is negative. Pancreas: Limited visualization. Either GDA or main duct is visualized at the head. If main duct, upper limits of normal at 3 mm. Spleen: Size and appearance within normal limits. Right Kidney: Length: 10 cm. Echogenicity within normal limits. No mass or hydronephrosis visualized. Left Kidney: Length: 11 cm. Echogenicity within normal limits. No mass or hydronephrosis visualized. Abdominal aorta: Only the proximal aorta could be seen due to bowel gas. No evidence of aneurysm. IMPRESSION: 1. Cholelithiasis without acute cholecystitis. 2. 12 mm hyperechoic lesion in the left liver favoring hemangioma. Suggest follow-up ultrasound in 6 months. 3. Limited visualization due to bowel gas and body habitus. Electronically Signed   By: JMonte FantasiaM.D.   On: 08/12/2015 03:34      ASSESSMENT: CAD-patent stent by cath  PLAN:  Continue secondary prevention.  No further cardiac testing needed before surgery.  Will sign off.  She is hypothyroid based on TSH.    JJettie Booze MD  08/14/2015  2:42 PM

## 2015-08-14 NOTE — H&P (View-Only) (Signed)
Patient Name: Kendra Adams Date of Encounter: 08/13/2015  Primary Cardiologist: Dr. Meda Coffee   Patient profile:  Kendra Adams is a 55 y.o. female with past medical history of CAD (s/p two BMS to [facility] in 2012), HTN, HLD, Type 2 DM, and GERD who presented to [facility] ED on 08/12/2015 for evaluation of chest pain, nausea, and vomiting for the past several days. Surgery plan lap chole pending cardiac clearance. Patient complained of chest pain as well. Pending myoview.    Principal Problem:   Chest pain Active Problems:   Hypothyroidism   HLD (hyperlipidemia)   Depression   Essential hypertension   GERD   CAD (coronary artery disease)   Diabetes mellitus type 2 with complications (HCC)   Tobacco use disorder   Chronic cough   Symptomatic cholelithiasis   Elevated liver enzymes    SUBJECTIVE  Denies any CP or SOB. No N/V  CURRENT MEDS . ampicillin-sulbactam (UNASYN) IV  3 g Intravenous Q6H  . aspirin  81 mg Oral Daily  . enoxaparin (LOVENOX) injection  60 mg Subcutaneous Q24H  . insulin aspart  0-9 Units Subcutaneous TID WC  . levothyroxine  150 mcg Oral QAC breakfast   And  . levothyroxine  12.5 mcg Oral QAC breakfast  . loratadine  10 mg Oral Daily  . losartan  25 mg Oral Daily  . metoprolol succinate  100 mg Oral Daily  . pantoprazole  40 mg Oral BID AC  . sertraline  150 mg Oral Daily  . sodium chloride flush  3 mL Intravenous Q12H    OBJECTIVE  Filed Vitals:   08/12/15 0819 08/12/15 1426 08/12/15 2111 08/13/15 0512  BP: 117/90 107/71 151/76 132/74  Pulse: 89 71    Temp: 97.7 F (36.5 C) 97.9 F (36.6 C) 97.6 F (36.4 C) 97.6 F (36.4 C)  TempSrc: Oral Oral Oral Oral  Resp: 20   20  Height: 5' 2"  (1.575 m)     Weight: 256 lb (116.121 kg)   244 lb 9.6 oz (110.95 kg)  SpO2: 96% 91% 97% 96%   No intake or output data in the 24 hours ending 08/13/15 0922 Filed Weights   08/12/15 0819 08/13/15 0512  Weight: 256 lb (116.121 kg) 244 lb 9.6 oz (110.95 kg)     PHYSICAL EXAM  General: Pleasant, NAD. Neuro: Alert and oriented X 3. Moves all extremities spontaneously. Psych: Normal affect. HEENT:  Normal  Neck: Supple without bruits or JVD. Lungs:  Resp regular and unlabored, CTA. Heart: RRR no s3, s4, or murmurs. Abdomen: Soft, non-tender, non-distended, BS + x 4.  Extremities: No clubbing, cyanosis or edema. DP/PT/Radials 2+ and equal bilaterally.  Accessory Clinical Findings  CBC  Recent Labs  08/12/15 0258 08/13/15 0306  WBC 10.9* 9.3  NEUTROABS 7.4  --   HGB 13.9 13.1  HCT 42.3 40.9  MCV 90.8 91.5  PLT 239 409   Basic Metabolic Panel  Recent Labs  08/12/15 0528 08/13/15 0306  NA 140 142  K 3.7 3.7  CL 103 105  CO2 26 30  GLUCOSE 142* 133*  BUN 9 8  CREATININE 0.76 0.67  CALCIUM 9.5 8.9   Liver Function Tests  Recent Labs  08/12/15 0528 08/13/15 0306  AST 159* 44*  ALT 92* 62*  ALKPHOS 286* 219*  BILITOT 0.6 0.7  PROT 6.6 6.3*  ALBUMIN 3.3* 3.2*    Recent Labs  08/12/15 0528  LIPASE 21   Cardiac Enzymes  Recent Labs  08/12/15 0528 08/12/15 1517 08/12/15 1856  TROPONINI <0.03 <0.03 <0.03   Thyroid Function Tests  Recent Labs  08/12/15 0543  TSH 10.530*    TELE NSR without significant ventricular ectopy    ECG  No new EKG   Radiology/Studies  Dg Chest 2 View  08/12/2015  CLINICAL DATA:  Acute onset of substernal chest pain and shortness of breath. Nausea and vomiting. Initial encounter. EXAM: CHEST  2 VIEW COMPARISON:  Chest radiograph performed 02/03/2015 FINDINGS: The lungs are well-aerated. Minimal bibasilar atelectasis is noted. Mild peribronchial thickening is seen. There is no evidence of pleural effusion or pneumothorax. The heart is normal in size; the mediastinal contour is within normal limits. No acute osseous abnormalities are seen. IMPRESSION: Minimal bibasilar atelectasis noted. Mild peribronchial thickening seen. Electronically Signed   By: Garald Balding M.D.    On: 08/12/2015 02:19   Nm Hepatobiliary Liver Func  08/12/2015  CLINICAL DATA:  Evaluate for acute cholecystitis. Intermittent right upper quadrant pain. EXAM: NUCLEAR MEDICINE HEPATOBILIARY IMAGING TECHNIQUE: Sequential images of the abdomen were obtained out to 60 minutes following intravenous administration of radiopharmaceutical. RADIOPHARMACEUTICALS:  5.4  mCi Tc-28m Choletec IV COMPARISON:  Ultrasound 08/12/2015 FINDINGS: Prompt uptake and biliary excretion of activity by the liver is seen. Biliary activity passes into small bowel, consistent with patent common bile duct. After 30 minutes of imaging no gallbladder activity was identified. Subsequently the patient received 4.6 mg of morphine an additional 1 millicurie of Choletec. Imaging was carried out for additional 30 minutes without visualization of the gallbladder. IMPRESSION: 1. Nonvisualization of the gallbladder following morphine administration compatible with cystic duct obstruction and acute cholecystitis. These results will be called to the ordering clinician or representative by the Radiologist Assistant, and communication documented in the PACS or zVision Dashboard. Electronically Signed   By: TKerby MoorsM.D.   On: 08/12/2015 15:00   [place]domen Complete  08/12/2015  CLINICAL DATA:  Epigastric and right upper quadrant pain for 1 week. EXAM: ABDOMEN ULTRASOUND COMPLETE COMPARISON:  None. FINDINGS: Gallbladder: Cholelithiasis without over distension or focal tenderness to suggest acute cholecystitis. Common bile duct: Diameter: 6 mm, upper limits of normal. Where visualized, no filling defect. Liver: 12 mm hyperechoic lesion in the upper left liver which is homogeneous appearing. No visible cirrhosis. IVC: Limited visualization is negative. Pancreas: Limited visualization. Either GDA or main duct is visualized at the head. If main duct, upper limits of normal at 3 mm. Spleen: Size and appearance within normal limits. Right Kidney: Length:  10 cm. Echogenicity within normal limits. No mass or hydronephrosis visualized. Left Kidney: Length: 11 cm. Echogenicity within normal limits. No mass or hydronephrosis visualized. Abdominal aorta: Only the proximal aorta could be seen due to bowel gas. No evidence of aneurysm. IMPRESSION: 1. Cholelithiasis without acute cholecystitis. 2. 12 mm hyperechoic lesion in the left liver favoring hemangioma. Suggest follow-up ultrasound in 6 months. 3. Limited visualization due to bowel gas and body habitus. Electronically Signed   By: JMonte FantasiaM.D.   On: 08/12/2015 03:34    ASSESSMENT AND PLAN  1. Chest pain with mixed typical and atypical features  - [facility]   - plan for myoview, originally ordered 2 day myoview starting today, however patient had HIDA scan yesterday and unable to have another nuclear study for 48 hours, earliest time is tomorrow. Will discuss with MD regarding whether can change her study to 1 day study tomorrow, her weight  is 244 today (although 256 yesterday), cut off for female is 225 lbs.   2. Preoperative clearance for lap chole: pending myoview given CP above  3. HLD: hold statin therapy in the setting of transaminitis.   4. Tobacco abuse  Signed, Almyra Deforest PA-C Pager: [phone]  I have examined the patient and reviewed assessment and plan and discussed with patient.  Agree with above as stated.   Discussed symptoms at length with the patient. She has had some recurrence of angina type pain. She had a negative stress test in 2015. Her discomfort is occurring at rest. Given her negative troponin negative ECG, I doubt ischemia; however, her symptoms are somewhat concerning. Would recommend diagnostic cardiac catheterization rather than 2 day nuclear stress test. However, she will need cholecystectomy. Would only plan revascularization if there is a high risk lesion that would affect her long-term outcome. Revascularization for more optional  disease would potentially delay her cholecystectomy. Her primary symptom coming and was clearly gallbladder pain.  Cardiac catheterization procedure was explained to the patient and all questions were answered.  Larae Grooms

## 2015-08-15 ENCOUNTER — Inpatient Hospital Stay (HOSPITAL_COMMUNITY): Payer: MEDICAID | Admitting: Anesthesiology

## 2015-08-15 ENCOUNTER — Encounter (HOSPITAL_COMMUNITY)
Admission: EM | Disposition: A | Discharge status: Home / Self Care | Payer: Self-pay | Source: Home / Self Care | Attending: Oncology

## 2015-08-15 DIAGNOSIS — Z9049 Acquired absence of other specified parts of digestive tract: Secondary | ICD-10-CM

## 2015-08-15 HISTORY — PX: CHOLECYSTECTOMY: SHX55

## 2015-08-15 LAB — GLUCOSE, CAPILLARY
Glucose-Capillary: 145 mg/dL — ABNORMAL HIGH (ref 65–99)
Glucose-Capillary: 173 mg/dL — ABNORMAL HIGH (ref 65–99)
Glucose-Capillary: 203 mg/dL — ABNORMAL HIGH (ref 65–99)
Glucose-Capillary: 216 mg/dL — ABNORMAL HIGH (ref 65–99)
Glucose-Capillary: 188 mg/dL — ABNORMAL HIGH (ref 65–99)

## 2015-08-15 LAB — COMPREHENSIVE METABOLIC PANEL
ALT: 38 U/L (ref 14–54)
AST: 23 U/L (ref 15–41)
Albumin: 3.2 g/dL — ABNORMAL LOW (ref 3.5–5.0)
Alkaline Phosphatase: 152 U/L — ABNORMAL HIGH (ref 38–126)
Anion gap: 9 (ref 5–15)
BUN: <5 mg/dL — ABNORMAL LOW (ref 6–20)
CO2: 28 mmol/L (ref 22–32)
Calcium: 9 mg/dL (ref 8.9–10.3)
Chloride: 104 mmol/L (ref 101–111)
Creatinine, Ser: 0.66 mg/dL (ref 0.44–1.00)
GFR calc Af Amer: >60 mL/min (ref >60)
GFR calc non Af Amer: >60 mL/min (ref >60)
Glucose, Bld: 124 mg/dL — ABNORMAL HIGH (ref 65–99)
Potassium: 3.7 mmol/L (ref 3.5–5.1)
Sodium: 141 mmol/L (ref 135–145)
Total Bilirubin: 0.6 mg/dL (ref 0.3–1.2)
Total Protein: 6.3 g/dL — ABNORMAL LOW (ref 6.5–8.1)

## 2015-08-15 LAB — SURGICAL PCR SCREEN
MRSA, PCR: NEGATIVE
Staphylococcus aureus: NEGATIVE

## 2015-08-15 SURGERY — LAPAROSCOPIC CHOLECYSTECTOMY
Anesthesia: General | Site: Abdomen

## 2015-08-15 MED ORDER — MIDAZOLAM HCL 5 MG/5ML IJ SOLN
INTRAMUSCULAR | Status: DC | PRN
  Administered 2015-08-15: 2 mg via INTRAVENOUS

## 2015-08-15 MED ORDER — ONDANSETRON HCL 4 MG/2ML IJ SOLN
INTRAMUSCULAR | Status: AC
Start: 2015-08-15 — End: 2015-08-15
  Filled 2015-08-15: qty 2

## 2015-08-15 MED ORDER — ONDANSETRON HCL 4 MG/2ML IJ SOLN
INTRAMUSCULAR | Status: AC
  Filled 2015-08-15: qty 2

## 2015-08-15 MED ORDER — PROPOFOL 10 MG/ML IV BOLUS
INTRAVENOUS | Status: AC
  Filled 2015-08-15: qty 20

## 2015-08-15 MED ORDER — EPHEDRINE SULFATE 50 MG/ML IJ SOLN
INTRAMUSCULAR | Status: DC | PRN
  Administered 2015-08-15 (×2): 10 mg via INTRAVENOUS
  Administered 2015-08-15: 15 mg via INTRAVENOUS
  Administered 2015-08-15 (×2): 10 mg via INTRAVENOUS

## 2015-08-15 MED ORDER — ONDANSETRON HCL 4 MG/2ML IJ SOLN
INTRAMUSCULAR | Status: DC | PRN
  Administered 2015-08-15 (×2): 4 mg via INTRAVENOUS

## 2015-08-15 MED ORDER — OXYCODONE-ACETAMINOPHEN 5-325 MG PO TABS
1.0000 | ORAL_TABLET | ORAL | Status: DC | PRN
  Administered 2015-08-15 (×2): 1 via ORAL
  Administered 2015-08-15 – 2015-08-16 (×3): 2 via ORAL
  Filled 2015-08-15 (×4): qty 2

## 2015-08-15 MED ORDER — LIDOCAINE HCL (CARDIAC) 20 MG/ML IV SOLN
INTRAVENOUS | Status: DC | PRN
  Administered 2015-08-15: 30 mg via INTRAVENOUS

## 2015-08-15 MED ORDER — FENTANYL CITRATE (PF) 100 MCG/2ML IJ SOLN
INTRAMUSCULAR | Status: DC | PRN
  Administered 2015-08-15: 100 ug via INTRAVENOUS
  Administered 2015-08-15 (×3): 50 ug via INTRAVENOUS

## 2015-08-15 MED ORDER — BUPIVACAINE-EPINEPHRINE 0.25% -1:200000 IJ SOLN
INTRAMUSCULAR | Status: DC | PRN
  Administered 2015-08-15: 8 mL

## 2015-08-15 MED ORDER — METOPROLOL TARTRATE 5 MG/5ML IV SOLN
INTRAVENOUS | Status: AC
  Filled 2015-08-15: qty 5

## 2015-08-15 MED ORDER — IOPAMIDOL (ISOVUE-300) INJECTION 61%
INTRAVENOUS | Status: AC
  Filled 2015-08-15: qty 50

## 2015-08-15 MED ORDER — GLYCOPYRROLATE 0.2 MG/ML IJ SOLN
INTRAMUSCULAR | Status: DC | PRN
  Administered 2015-08-15: 0.2 mg via INTRAVENOUS

## 2015-08-15 MED ORDER — SODIUM CHLORIDE 0.9 % IR SOLN
Status: DC | PRN
  Administered 2015-08-15: 1000 mL

## 2015-08-15 MED ORDER — SUGAMMADEX SODIUM 500 MG/5ML IV SOLN
INTRAVENOUS | Status: DC | PRN
  Administered 2015-08-15: 222.4 mg via INTRAVENOUS

## 2015-08-15 MED ORDER — 0.9 % SODIUM CHLORIDE (POUR BTL) OPTIME
TOPICAL | Status: DC | PRN
  Administered 2015-08-15: 1000 mL

## 2015-08-15 MED ORDER — PROMETHAZINE HCL 25 MG/ML IJ SOLN: 6.2500 mg | INTRAMUSCULAR | Status: DC | PRN

## 2015-08-15 MED ORDER — BUPIVACAINE-EPINEPHRINE (PF) 0.25% -1:200000 IJ SOLN
INTRAMUSCULAR | Status: AC
  Filled 2015-08-15: qty 30

## 2015-08-15 MED ORDER — HEMOSTATIC AGENTS (NO CHARGE) OPTIME
TOPICAL | Status: DC | PRN
  Administered 2015-08-15: 1 via TOPICAL

## 2015-08-15 MED ORDER — DEXAMETHASONE SODIUM PHOSPHATE 10 MG/ML IJ SOLN
INTRAMUSCULAR | Status: DC | PRN
  Administered 2015-08-15: 4 mg via INTRAVENOUS

## 2015-08-15 MED ORDER — METOPROLOL TARTRATE 5 MG/5ML IV SOLN
INTRAVENOUS | Status: DC | PRN
  Administered 2015-08-15: .5 mg via INTRAVENOUS

## 2015-08-15 MED ORDER — LACTATED RINGERS IV SOLN
INTRAVENOUS | Status: DC | PRN
  Administered 2015-08-15 (×2): via INTRAVENOUS

## 2015-08-15 MED ORDER — MIDAZOLAM HCL 2 MG/2ML IJ SOLN
INTRAMUSCULAR | Status: AC
  Filled 2015-08-15: qty 2

## 2015-08-15 MED ORDER — PROPOFOL 10 MG/ML IV BOLUS
INTRAVENOUS | Status: DC | PRN
  Administered 2015-08-15: 150 mg via INTRAVENOUS

## 2015-08-15 MED ORDER — HYDROMORPHONE HCL 1 MG/ML IJ SOLN: 0.2500 mg | INTRAMUSCULAR | Status: DC | PRN

## 2015-08-15 MED ORDER — ROCURONIUM BROMIDE 100 MG/10ML IV SOLN
INTRAVENOUS | Status: DC | PRN
  Administered 2015-08-15: 40 mg via INTRAVENOUS

## 2015-08-15 MED ORDER — SODIUM CHLORIDE 0.9 % IV SOLN
INTRAVENOUS | Status: DC | PRN
  Administered 2015-08-15: 50 mL

## 2015-08-15 MED ORDER — ROCURONIUM BROMIDE 50 MG/5ML IV SOLN
INTRAVENOUS | Status: AC
  Filled 2015-08-15: qty 1

## 2015-08-15 MED ORDER — LIDOCAINE 2% (20 MG/ML) 5 ML SYRINGE
INTRAMUSCULAR | Status: AC
  Filled 2015-08-15: qty 10

## 2015-08-15 MED ORDER — PHENYLEPHRINE HCL 10 MG/ML IJ SOLN
INTRAMUSCULAR | Status: DC | PRN
  Administered 2015-08-15: 40 ug via INTRAVENOUS
  Administered 2015-08-15: 80 ug via INTRAVENOUS

## 2015-08-15 MED ORDER — FENTANYL CITRATE (PF) 250 MCG/5ML IJ SOLN
INTRAMUSCULAR | Status: AC
  Filled 2015-08-15: qty 5

## 2015-08-15 SURGICAL SUPPLY — 46 items
APPLIER CLIP ROT 10 11.4 M/L (STAPLE) ×4
APR CLP MED LRG 11.4X10 (STAPLE) ×2
BAG SPEC RTRVL LRG 6X4 10 (ENDOMECHANICALS) ×2
BLADE SURG ROTATE 9660 (MISCELLANEOUS) IMPLANT
CANISTER SUCTION 2500CC (MISCELLANEOUS) ×4 IMPLANT
CHLORAPREP W/TINT 26ML (MISCELLANEOUS) ×4 IMPLANT
CLIP APPLIE ROT 10 11.4 M/L (STAPLE) ×2 IMPLANT
COVER MAYO STAND STRL (DRAPES) ×4 IMPLANT
COVER SURGICAL LIGHT HANDLE (MISCELLANEOUS) ×4 IMPLANT
DRAPE C-ARM 42X72 X-RAY (DRAPES) ×4 IMPLANT
DRAPE WARM FLUID 44X44 (DRAPE) ×4 IMPLANT
ELECT REM PT RETURN 9FT ADLT (ELECTROSURGICAL) ×4
ELECTRODE REM PT RTRN 9FT ADLT (ELECTROSURGICAL) ×2 IMPLANT
GLOVE BIO SURGEON STRL SZ8 (GLOVE) ×4 IMPLANT
GLOVE BIOGEL PI IND STRL 7.0 (GLOVE) ×1 IMPLANT
GLOVE BIOGEL PI IND STRL 7.5 (GLOVE) ×1 IMPLANT
GLOVE BIOGEL PI IND STRL 8 (GLOVE) ×3 IMPLANT
GLOVE BIOGEL PI INDICATOR 7.0 (GLOVE) ×2
GLOVE BIOGEL PI INDICATOR 7.5 (GLOVE) ×2
GLOVE BIOGEL PI INDICATOR 8 (GLOVE) ×4
GLOVE SURG SS PI 7.0 STRL IVOR (GLOVE) ×3 IMPLANT
GLOVE SURG SS PI 7.5 STRL IVOR (GLOVE) ×3 IMPLANT
GOWN STRL REUS W/ TWL LRG LVL3 (GOWN DISPOSABLE) ×4 IMPLANT
GOWN STRL REUS W/ TWL XL LVL3 (GOWN DISPOSABLE) ×2 IMPLANT
GOWN STRL REUS W/TWL LRG LVL3 (GOWN DISPOSABLE) ×8
GOWN STRL REUS W/TWL XL LVL3 (GOWN DISPOSABLE) ×4
HEMOSTAT SNOW SURGICEL 2X4 (HEMOSTASIS) ×3 IMPLANT
KIT BASIN OR (CUSTOM PROCEDURE TRAY) ×4 IMPLANT
KIT ROOM TURNOVER OR (KITS) ×4 IMPLANT
LIQUID BAND (GAUZE/BANDAGES/DRESSINGS) ×4 IMPLANT
NS IRRIG 1000ML POUR BTL (IV SOLUTION) ×4 IMPLANT
PAD ARMBOARD 7.5X6 YLW CONV (MISCELLANEOUS) ×4 IMPLANT
POUCH SPECIMEN RETRIEVAL 10MM (ENDOMECHANICALS) ×4 IMPLANT
SCISSORS LAP 5X35 DISP (ENDOMECHANICALS) ×4 IMPLANT
SET CHOLANGIOGRAPH 5 50 .035 (SET/KITS/TRAYS/PACK) ×4 IMPLANT
SET IRRIG TUBING LAPAROSCOPIC (IRRIGATION / IRRIGATOR) ×4 IMPLANT
SLEEVE ENDOPATH XCEL 5M (ENDOMECHANICALS) ×4 IMPLANT
SPECIMEN JAR SMALL (MISCELLANEOUS) ×4 IMPLANT
SUT MNCRL AB 4-0 PS2 18 (SUTURE) ×4 IMPLANT
TOWEL OR 17X24 6PK STRL BLUE (TOWEL DISPOSABLE) ×4 IMPLANT
TOWEL OR 17X26 10 PK STRL BLUE (TOWEL DISPOSABLE) ×4 IMPLANT
TRAY LAPAROSCOPIC MC (CUSTOM PROCEDURE TRAY) ×4 IMPLANT
TROCAR XCEL BLUNT TIP 100MML (ENDOMECHANICALS) ×4 IMPLANT
TROCAR XCEL NON-BLD 11X100MML (ENDOMECHANICALS) ×4 IMPLANT
TROCAR XCEL NON-BLD 5MMX100MML (ENDOMECHANICALS) ×4 IMPLANT
TUBING INSUFFLATION (TUBING) ×4 IMPLANT

## 2015-08-15 NOTE — Anesthesia Procedure Notes (Signed)
Procedure Name: Intubation Date/Time: 08/15/2015 8:36 AM Performed by: Neldon Newport Pre-anesthesia Checklist: Patient being monitored, Suction available, Emergency Drugs available, Patient identified and Timeout performed Patient Re-evaluated:Patient Re-evaluated prior to inductionOxygen Delivery Method: Circle system utilized Preoxygenation: Pre-oxygenation with 100% oxygen Intubation Type: IV induction Ventilation: Mask ventilation without difficulty Laryngoscope Size: Mac and 3 Grade View: Grade I Tube type: Oral Tube size: 7.0 mm Number of attempts: 2 Placement Confirmation: positive ETCO2,  ETT inserted through vocal cords under direct vision and breath sounds checked- equal and bilateral Secured at: 22 cm Tube secured with: Tape Dental Injury: Teeth and Oropharynx as per pre-operative assessment

## 2015-08-15 NOTE — Progress Notes (Signed)
Day of Surgery  Subjective: HEART CLEARED   Objective: Vital signs in last 24 hours: Temp:  [97.7 F (36.5 C)-98.7 F (37.1 C)] 98.3 F (36.8 C) (06/07 0513) Pulse Rate:  [64-80] 74 (06/07 0513) Resp:  [11-18] 18 (06/07 0513) BP: (110-158)/(67-89) 133/68 mmHg (06/07 0513) SpO2:  [92 %-99 %] 92 % (06/07 0513) Weight:  [111.2 kg (245 lb 2.4 oz)] 111.2 kg (245 lb 2.4 oz) (06/07 0513) Last BM Date: 08/15/15  Intake/Output from previous day: 06/06 0701 - 06/07 0700 In: 800 [P.O.:600; IV Piggyback:200] Out: 2001 [Urine:2000; Stool:1] Intake/Output this shift:    ABDOMEN  SOFT NO RUQ TENDERNESS   Lab Results:   Recent Labs  08/13/15 0306 08/14/15 1236  WBC 9.3 7.4  HGB 13.1 12.9  HCT 40.9 39.9  PLT 210 224   BMET  Recent Labs  08/14/15 0330 08/14/15 1236 08/15/15 0236  NA 139  --  141  K 3.6  --  3.7  CL 103  --  104  CO2 28  --  28  GLUCOSE 130*  --  124*  BUN 6  --  <5*  CREATININE 0.63 0.58 0.66  CALCIUM 9.3  --  9.0   PT/INR  Recent Labs  08/14/15 0516  LABPROT 14.0  INR 1.06   ABG No results for input(s): PHART, HCO3 in the last 72 hours.  Invalid input(s): PCO2, PO2  Studies/Results: No results found.  Anti-infectives: Anti-infectives    Start     Dose/Rate Route Frequency Ordered Stop   08/12/15 1700  [MAR Hold]  Ampicillin-Sulbactam (UNASYN) 3 g in sodium chloride 0.9 % 100 mL IVPB     (MAR Hold since 08/15/15 0747)   3 g 100 mL/hr over 60 Minutes Intravenous Every 6 hours 08/12/15 1613        Assessment/Plan: Cholelithiasis  For lap chole / IOC  The procedure has been discussed with the patient. Operative and non operative treatments have been discussed. Risks of surgery include bleeding, infection,  Common bile duct injury,  Injury to the stomach,liver, colon,small intestine, abdominal wall,  Diaphragm,  Major blood vessels,  And the need for an open procedure.  Other risks include worsening of medical problems, death,  DVT and  pulmonary embolism, and cardiovascular events.   Medical options have also been discussed. The patient has been informed of long term expectations of surgery and non surgical options,  The patient agrees to proceed.     LOS: 2 days    Aziah Kaiser A. 08/15/2015

## 2015-08-15 NOTE — Progress Notes (Signed)
   Subjective: S/p laparoscopic cholecystectomy. Patient has some abdominal pain and is not quite ready to eat, otherwise has no complaints.   Objective: Vital signs in last 24 hours: Filed Vitals:   08/15/15 1100 08/15/15 1115 08/15/15 1130 08/15/15 1157  BP: 124/71 107/73 124/74 132/76  Pulse: 95 101 87 90  Temp:   98.1 F (36.7 C) 97.8 F (36.6 C)  TempSrc:    Oral  Resp: 18 17 17 18   Height:      Weight:      SpO2: 93% 92% 91% 94%   Weight change: -1 lb 8.8 oz (-0.703 kg)  Intake/Output Summary (Last 24 hours) at 08/15/15 1158 Last data filed at 08/15/15 1100  Gross per 24 hour  Intake   2400 ml  Output   2326 ml  Net     74 ml    General: obese Caucasian woman, resting in bed, no acute distress Cardiac: distant heart sounds, RRR, no rubs, murmurs or gallops Pulm: clear to auscultation bilaterally Abd: soft, laporoscopic incision site Ext: warm and well perfused, no pedal edema Neuro: alert oriented x 3  Assessment/Plan: Principal Problem:   Acute calculous cholecystitis Active Problems:   Hypothyroidism   HLD (hyperlipidemia)   Depression   Hypertension associated with diabetes (HCC)   GERD   CAD (coronary artery disease)   Diabetes mellitus type 2 with complications (HCC)   Morbid obesity (HCC)   Tobacco use disorder   Elevated liver enzymes   Chest pain   Pain in the chest  Acute cholecystitis with cholelithiasis: Ab [place] 6/4 with cholelithiasis though common bile duct diameter within the upper limit of normal however study is limited due to large body habitus. HIDA scan confirms acute cholecystitis. She is s/p laparscopic cholecystectomy today (08/15/15) -Discontinue Unasyn IV 3 grams q6h -Continue pantoprazole 40 mg twice daily -Hep B core antibody, surface antigen, and surface antibody are negative -f/u surgical pathology -Surgery following, appreciate assistance  Chest pressure: May be in the setting of symptomatic cholelithiasis though unclear if  related to angina as it has been occurring at rest and without exertion. Cardiac cath 12/2010 notable for 99% mid RCA occlusion with placement of 2 bare metal stents. The stress test 2015 was unremarkable, she was to repeat in December 2016 which never happened. Troponins negative x3, EKG reassuring. Cath shows CAD with patent stent, cleared by cardiology on 08/14/15 for surgery. -Cardiology consulted, appreciate assistance -s/p cath on 08/14/15   Hypothyroidism: TSH 10, elevated from 1.5 back in March 2017. Suspect there may be non-adherence as usual dosing is 1 microgram per kilogram. -Continue Synthroid 172.5 g for now though will have to assess adherence  Hyperlipidemia: Continue Lipitor 80 mg daily  Coronary artery disease s/p PCI [BMS in 12/2010]: Continue aspirin 89m.  Non-insulin-dependent type 2 diabetes: A1c 7.6, December 2016.  -Continue SSI-S  Depression: Continue sertraline 150 mg daily.  Hypertension: Blood pressures trending 110s-160s/70s-90s. -Continue losartan 25 mg daily -Continue metoprolol 100 mg daily  Diet: NPO at midnight  DVT prophylaxis: Lovenox  CODE STATUS: FULL CODE  Dispo: Disposition is deferred at this time, awaiting improvement of current medical problems.   The patient does have a current PCP (Loleta Chance MD) and [facility]  The patient does not know have transportation limitations that hinder transportation to clinic appointments.    LOS: 2 days   VZada Finders MD 08/15/2015, 11:58 AM

## 2015-08-15 NOTE — Op Note (Signed)
Laparoscopic Cholecystectomy  Procedure Note  Indications: This patient presents with symptomatic gallbladder disease and will undergo laparoscopic cholecystectomy. The procedure has been discussed with the patient. Operative and non operative treatments have been discussed. Risks of surgery include bleeding, infection,  Common bile duct injury,  Injury to the stomach,liver, colon,small intestine, abdominal wall,  Diaphragm,  Major blood vessels,  And the need for an open procedure.  Other risks include worsening of medical problems, death,  DVT and pulmonary embolism, and cardiovascular events.   Medical options have also been discussed. The patient has been informed of long term expectations of surgery and non surgical options,  The patient agrees to proceed.    Pre-operative Diagnosis: Acute and chronic cholecystitis  Post-operative Diagnosis: Same  Surgeon: Legend Tumminello A.    Anesthesia: General endotracheal anesthesia and Local anesthesia 0.25.% bupivacaine, with epinephrine  ASA Class: 3  Procedure Details  The patient was seen again in the Holding Room. The risks, benefits, complications, treatment options, and expected outcomes were discussed with the patient. The possibilities of reaction to medication, pulmonary aspiration, perforation of viscus, bleeding, recurrent infection, finding a normal gallbladder, the need for additional procedures, failure to diagnose a condition, the possible need to convert to an open procedure, and creating a complication requiring transfusion or operation were discussed with the patient. The patient and/or family concurred with the proposed plan, giving informed consent. The site of surgery properly noted/marked. The patient was taken to Operating Room, identified as Kendra Adams and the procedure verified as Laparoscopic Cholecystectomy with Intraoperative Cholangiograms. A Time Out was held and the above information confirmed.  Prior to the induction of  general anesthesia, antibiotic prophylaxis was administered. General endotracheal anesthesia was then administered and tolerated well. After the induction, the abdomen was prepped in the usual sterile fashion. The patient was positioned in the supine position with the left arm comfortably tucked, along with some reverse Trendelenburg.  Local anesthetic agent was injected into the skin near the umbilicus and an incision made. The midline fascia was incised and the Hasson technique was used to introduce a 12 mm port under direct vision. It was secured with a figure of eight Vicryl suture placed in the usual fashion. Pneumoperitoneum was then created with CO2 and tolerated well without any adverse changes in the patient's vital signs. Additional trocars were introduced under direct vision with an 11 mm trocar in the epigastrium and two  5 mm trocars in the right upper quadrant. All skin incisions were infiltrated with a local anesthetic agent before making the incision and placing the trocars.   The gallbladder was identified, the fundus grasped and retracted cephalad. There was one dense adhesion to the duodenum that was taken down bluntly.  There was no injury to the duodenum and there was a small abscess that was drained. There was no fistula to the duodenum.   Adhesions were lysed bluntly and with the electrocautery where indicated, taking care not to injure any adjacent organs or viscus. The infundibulum was grasped and retracted laterally, exposing the peritoneum overlying the triangle of Calot. This was then divided and exposed in a blunt fashion. The cystic duct was clearly identified and bluntly dissected circumferentially. The junctions of the gallbladder, cystic duct and common bile duct were clearly identified prior to the division of any linear structure.   An incision was made in the cystic duct and the cholangiogram catheter introduced. The catheter would not feed into the cystic duct   After  multiple  attempts. The catheter was then removed and cholangiogram was aborted. The critical view was obtained prior to cutting any other structure.    The cystic duct was then  ligated with surgical clips  on the patient side and  clipped on the gallbladder side and divided. The cystic artery was identified, dissected free, ligated with clips and divided as well. Posterior cystic artery clipped and divided.  The gallbladder was dissected from the liver bed in retrograde fashion with the electrocautery. The gallbladder was removed. The liver bed was irrigated and inspected. Hemostasis was achieved with the electrocautery. Copious irrigation was utilized and was repeatedly aspirated until clear all particulate matter. Hemostasis was achieved with no signs  Of bleeding or bile leakage.  Pneumoperitoneum was completely reduced after viewing removal of the trocars under direct vision. The wound was thoroughly irrigated and the fascia was then closed with a figure of eight suture; the skin was then closed with 4 O monocryl  and Liquid adhesive  was applied.  Instrument, sponge, and needle counts were correct at closure and at the conclusion of the case.   Findings: Cholecystitis with Cholelithiasis  Estimated Blood Loss: less than 50 mL         Drains: none          Total IV Fluids: 800 mL         Specimens: Gallbladder           Complications: None; patient tolerated the procedure well.         Disposition: PACU - hemodynamically stable.         Condition: stable

## 2015-08-15 NOTE — Transfer of Care (Signed)
Immediate Anesthesia Transfer of Care Note  Patient: Kendra Adams  Procedure(s) Performed: Procedure(s): LAPAROSCOPIC CHOLECYSTECTOMY WITH ATTEMPTED INTRAOPERATIVE CHOLANGIOGRAM (N/A)  Patient Location: PACU  Anesthesia Type:General  Level of Consciousness: awake, alert  and oriented  Airway & Oxygen Therapy: Patient Spontanous Breathing and Patient connected to face mask oxygen  Post-op Assessment: Report given to RN, Post -op Vital signs reviewed and stable and Patient moving all extremities X 4  Post vital signs: Reviewed and stable  Last Vitals:  Filed Vitals:   08/14/15 2209 08/15/15 0513  BP: 110/76 133/68  Pulse: 68 74  Temp: 37.1 C 36.8 C  Resp: 18 18    Last Pain:  Filed Vitals:   08/15/15 1033  PainSc: 0-No pain         Complications: No apparent anesthesia complications

## 2015-08-15 NOTE — Discharge Instructions (Signed)
Laparoscopic Cholecystectomy, Care After °Refer to this sheet in the next few weeks. These instructions provide you with information about caring for yourself after your procedure. Your health care provider may also give you more specific instructions. Your treatment has been planned according to current medical practices, but problems sometimes occur. Call your health care provider if you have any problems or questions after your procedure. °WHAT TO EXPECT AFTER THE PROCEDURE °After your procedure, it is common to have: °· Pain at your incision sites. You will be given pain medicines to control your pain. °· Mild nausea or vomiting. This should improve after the first 24 hours. °· Bloating and possible shoulder pain from the gas that was used during the procedure. This will improve after the first 24 hours. °HOME CARE INSTRUCTIONS °Incision Care °· Follow instructions from your health care provider about how to take care of your incisions. Make sure you: °¨ Wash your hands with soap and water before you change your bandage (dressing). If soap and water are not available, use hand sanitizer. °¨ Change your dressing as told by your health care provider. °¨ Leave stitches (sutures), skin glue, or adhesive strips in place. These skin closures may need to be in place for 2 weeks or longer. If adhesive strip edges start to loosen and curl up, you may trim the loose edges. Do not remove adhesive strips completely unless your health care provider tells you to do that. °· Do not take baths, swim, or use a hot tub until your health care provider approves. Ask your health care provider if you can take showers. You may only be allowed to take sponge baths for bathing. °General Instructions °· Take over-the-counter and prescription medicines only as told by your health care provider. °· Do not drive or operate heavy machinery while taking prescription pain medicine. °· Return to your normal diet as told by your health care  provider. °· Do not lift anything that is heavier than 10 lb (4.5 kg). °· Do not play contact sports for one week or until your health care provider approves. °SEEK MEDICAL CARE IF:  °· You have redness, swelling, or pain at the site of your incision. °· You have fluid, blood, or pus coming from your incision. °· You notice a bad smell coming from your incision area. °· Your surgical incisions break open. °· You have a fever. °SEEK IMMEDIATE MEDICAL CARE IF: °· You develop a rash. °· You have difficulty breathing. °· You have chest pain. °· You have increasing pain in your shoulders (shoulder strap areas). °· You faint or have dizzy episodes while you are standing. °· You have severe pain in your abdomen. °· You have nausea or vomiting that lasts for more than one day. °  °This information is not intended to replace advice given to you by your health care provider. Make sure you discuss any questions you have with your health care provider. °  °Document Released: 02/24/2005 Document Revised: 11/15/2014 Document Reviewed: 10/06/2012 °Elsevier Interactive Patient Education ©2016 [employer] °CCS ______[facility] °LAPAROSCOPIC SURGERY: POST OP INSTRUCTIONS °Always review your discharge instruction sheet given to you by the facility where your surgery was performed. °IF YOU HAVE DISABILITY OR FAMILY LEAVE FORMS, YOU MUST BRING THEM TO THE OFFICE FOR PROCESSING.   °DO NOT GIVE THEM TO YOUR DOCTOR. ° °1. A prescription for pain medication may be given to you upon discharge.  Take your pain medication as prescribed, if needed.  If narcotic   pain medicine is not needed, then you may take acetaminophen (Tylenol) or ibuprofen (Advil) as needed. °2. Take your usually prescribed medications unless otherwise directed. °3. If you need a refill on your pain medication, please contact your pharmacy.  They will contact our office to request authorization. Prescriptions will not be filled after 5pm or on  week-ends. °4. You should follow a light diet the first few days after arrival home, such as soup and crackers, etc.  Be sure to include lots of fluids daily. °5. Most patients will experience some swelling and bruising in the area of the incisions.  Ice packs will help.  Swelling and bruising can take several days to resolve.  °6. It is common to experience some constipation if taking pain medication after surgery.  Increasing fluid intake and taking a stool softener (such as Colace) will usually help or prevent this problem from occurring.  A mild laxative (Milk of Magnesia or Miralax) should be taken according to package instructions if there are no bowel movements after 48 hours. °7. Unless discharge instructions indicate otherwise, you may remove your bandages 24-48 hours after surgery, and you may shower at that time.  You may have steri-strips (small skin tapes) in place directly over the incision.  These strips should be left on the skin for 7-10 days.  If your surgeon used skin glue on the incision, you may shower in 24 hours.  The glue will flake off over the next 2-3 weeks.  Any sutures or staples will be removed at the office during your follow-up visit. °8. ACTIVITIES:  You may resume regular (light) daily activities beginning the next day--such as daily self-care, walking, climbing stairs--gradually increasing activities as tolerated.  You may have sexual intercourse when it is comfortable.  Refrain from any heavy lifting or straining until approved by your doctor. °a. You may drive when you are no longer taking prescription pain medication, you can comfortably wear a seatbelt, and you can safely maneuver your car and apply brakes. °b. RETURN TO WORK:  __________________________________________________________ °9. You should see your doctor in the office for a follow-up appointment approximately 2-3 weeks after your surgery.  Make sure that you call for this appointment within a day or two after you  arrive home to insure a convenient appointment time. °10. OTHER INSTRUCTIONS: __________________________________________________________________________________________________________________________ __________________________________________________________________________________________________________________________ °WHEN TO CALL YOUR DOCTOR: °1. Fever over 101.0 °2. Inability to urinate °3. Continued bleeding from incision. °4. Increased pain, redness, or drainage from the incision. °5. Increasing abdominal pain ° °The clinic staff is available to answer your questions during regular business hours.  Please don’t hesitate to call and ask to speak to one of the nurses for clinical concerns.  If you have a medical emergency, go to the nearest emergency room or call 911.  A surgeon from [facility] is always on call at the hospital. °[address], [address], [place], [place]  [address] ? [address] [address], [place], [place]   [address] °[phone] ? [phone] ? FAX [phone] °Web site: [email or web address] ° °

## 2015-08-15 NOTE — Progress Notes (Signed)
Patient lying in bed, no needs at this time. Husband present at bedside. Call light within reach.

## 2015-08-15 NOTE — Progress Notes (Signed)
Report given to robin roberts rn as caregiver 

## 2015-08-16 ENCOUNTER — Encounter (HOSPITAL_COMMUNITY): Payer: Self-pay | Admitting: Surgery

## 2015-08-16 LAB — COMPREHENSIVE METABOLIC PANEL
ALT: 56 U/L — ABNORMAL HIGH (ref 14–54)
AST: 55 U/L — ABNORMAL HIGH (ref 15–41)
Albumin: 3.3 g/dL — ABNORMAL LOW (ref 3.5–5.0)
Alkaline Phosphatase: 156 U/L — ABNORMAL HIGH (ref 38–126)
Anion gap: 6 (ref 5–15)
BUN: <5 mg/dL — ABNORMAL LOW (ref 6–20)
CO2: 27 mmol/L (ref 22–32)
Calcium: 9 mg/dL (ref 8.9–10.3)
Chloride: 107 mmol/L (ref 101–111)
Creatinine, Ser: 0.58 mg/dL (ref 0.44–1.00)
GFR calc Af Amer: >60 mL/min (ref >60)
GFR calc non Af Amer: >60 mL/min (ref >60)
Glucose, Bld: 162 mg/dL — ABNORMAL HIGH (ref 65–99)
Potassium: 3.8 mmol/L (ref 3.5–5.1)
Sodium: 140 mmol/L (ref 135–145)
Total Bilirubin: 0.4 mg/dL (ref 0.3–1.2)
Total Protein: 6.1 g/dL — ABNORMAL LOW (ref 6.5–8.1)

## 2015-08-16 LAB — CBC
HCT: 39.1 % (ref 36.0–46.0)
Hemoglobin: 12.8 g/dL (ref 12.0–15.0)
MCH: 29.5 pg (ref 26.0–34.0)
MCHC: 32.7 g/dL (ref 30.0–36.0)
MCV: 90.1 fL (ref 78.0–100.0)
Platelets: 222 10*3/uL (ref 150–400)
RBC: 4.34 MIL/uL (ref 3.87–5.11)
RDW: 13.1 % (ref 11.5–15.5)
WBC: 14.2 10*3/uL — ABNORMAL HIGH (ref 4.0–10.5)

## 2015-08-16 LAB — GLUCOSE, CAPILLARY
Glucose-Capillary: 143 mg/dL — ABNORMAL HIGH (ref 65–99)
Glucose-Capillary: 215 mg/dL — ABNORMAL HIGH (ref 65–99)
Glucose-Capillary: 137 mg/dL — ABNORMAL HIGH (ref 65–99)

## 2015-08-16 NOTE — Progress Notes (Signed)
1 Day Post-Op  Subjective: Sleeping and tolerating diet, want breakfast.  Site look fine she has been up to the Bathroom so far.    Objective: Vital signs in last 24 hours: Temp:  [97 F (36.1 C)-98.5 F (36.9 C)] 98.5 F (36.9 C) (06/08 0454) Pulse Rate:  [70-105] 70 (06/08 0454) Resp:  [17-20] 20 (06/08 0454) BP: (107-137)/(52-86) 137/65 mmHg (06/08 0454) SpO2:  [91 %-97 %] 97 % (06/08 0454) Weight:  [112.22 kg (247 lb 6.4 oz)] 112.22 kg (247 lb 6.4 oz) (06/08 0454) Last BM Date: 08/15/15 720 PO recorded 1900 urine Afebrile, VSS Labs:  WBC is up some   Intake/Output from previous day: 06/07 0701 - 06/08 0700 In: 2320 [P.O.:720; I.V.:1600] Out: 1925 [Urine:1900; Blood:25] Intake/Output this shift:    General appearance: alert, cooperative and no distress GI: soft, sore, sites all look good.  Tolerating PO's  Lab Results:   Recent Labs  08/14/15 1236 08/16/15 0300  WBC 7.4 14.2*  HGB 12.9 12.8  HCT 39.9 39.1  PLT 224 222    BMET  Recent Labs  08/15/15 0236 08/16/15 0300  NA 141 140  K 3.7 3.8  CL 104 107  CO2 28 27  GLUCOSE 124* 162*  BUN <5* <5*  CREATININE 0.66 0.58  CALCIUM 9.0 9.0   PT/INR  Recent Labs  08/14/15 0516  LABPROT 14.0  INR 1.06     Recent Labs Lab 08/12/15 0528 08/13/15 0306 08/14/15 0330 08/15/15 0236 08/16/15 0300  AST 159* 44* 27 23 55*  ALT 92* 62* 47 38 56*  ALKPHOS 286* 219* 185* 152* 156*  BILITOT 0.6 0.7 0.7 0.6 0.4  PROT 6.6 6.3* 6.6 6.3* 6.1*  ALBUMIN 3.3* 3.2* 3.4* 3.2* 3.3*     Lipase     Component Value Date/Time   LIPASE 21 08/12/2015 0528     Studies/Results: No results found.  Medications: . aspirin  81 mg Oral Daily  . insulin aspart  0-9 Units Subcutaneous TID WC  . levothyroxine  150 mcg Oral QAC breakfast   And  . levothyroxine  12.5 mcg Oral QAC breakfast  . loratadine  10 mg Oral Daily  . losartan  25 mg Oral Daily  . metoprolol succinate  100 mg Oral Daily  . pantoprazole  40  mg Oral BID AC  . sertraline  150 mg Oral Daily  . sodium chloride flush  3 mL Intravenous Q12H    . 0.9 % NaCl with KCl 20 mEq / L 100 mL/hr at 08/15/15 2324   CAD/chest pain, awaiting cardiac clearance Hypothyroid AODM Hypertension Dyslipidemia Body mass index is 45. Depression  Assessment/Plan Acute and chronic cholecystitis S/p laparoscopic cholecystectomy 08/15/15, Dr. Marcello Moores Cornett  POD 1 FEN:  Cardiac diet/IV fluids ID:  Completed 3 days of Unasyn 08/15/15 pre op VTE:  SCD added    Dispo;  Home when medically ready.  From our standpoint if she can eat, drink walk and void she can go home.  Pain control with Tylenol or  Percocet.  We have post op care and follow up in the AVS. If she does not go home she can go on Heparin or Lovenox for DVT prophylaxis  1600 hours today.    LOS: 3 days    Antowan Samford 08/16/2015 [phone]

## 2015-08-16 NOTE — Progress Notes (Signed)
Patient wants to progress diet. Advised her that i would do a heart healthy diet at this time before progressing her to a regular diet.

## 2015-08-16 NOTE — Progress Notes (Signed)
   Subjective: Patient feels well, tolerating diet. Feels ready to go home.  Objective: Vital signs in last 24 hours: Filed Vitals:   08/15/15 1302 08/15/15 2004 08/16/15 0454 08/16/15 1012  BP: 136/86 122/52 137/65 135/64  Pulse: 105 78 70 75  Temp: 97 F (36.1 C) 98.3 F (36.8 C) 98.5 F (36.9 C)   TempSrc: Oral Oral Oral   Resp: 18 18 20    Height:      Weight:   247 lb 6.4 oz (112.22 kg)   SpO2: 95% 94% 97%    Weight change: 2 lb 4 oz (1.02 kg)  Intake/Output Summary (Last 24 hours) at 08/16/15 1147 Last data filed at 08/16/15 0900  Gross per 24 hour  Intake    960 ml  Output   2000 ml  Net  -1040 ml    General: obese Caucasian woman, resting in bed, no acute distress Cardiac: distant heart sounds, RRR, no rubs, murmurs or gallops Pulm: clear to auscultation bilaterally Abd: soft, laporoscopic incision site Ext: warm and well perfused, no pedal edema Neuro: alert oriented x 3  Assessment/Plan: Principal Problem:   Acute calculous cholecystitis Active Problems:   Hypothyroidism   HLD (hyperlipidemia)   Depression   Hypertension associated with diabetes (HCC)   GERD   CAD (coronary artery disease)   Diabetes mellitus type 2 with complications (HCC)   Morbid obesity (HCC)   Tobacco use disorder   Elevated liver enzymes   Chest pain   Pain in the chest  Acute cholecystitis with cholelithiasis: Ab [place] 6/4 with cholelithiasis though common bile duct diameter within the upper limit of normal however study is limited due to large body habitus. HIDA scan confirms acute cholecystitis. She is s/p laparscopic cholecystectomy (08/15/15). She feels well and is tolerating oral intake. -Continue pantoprazole 40 mg twice daily -Hep B core antibody, surface antigen, and surface antibody are negative -f/u surgical pathology -Surgery following and have cleared for discharge on their end, appreciate assistance  Chest pressure: May be in the setting of symptomatic cholelithiasis  though unclear if related to angina as it has been occurring at rest and without exertion. Cardiac cath 12/2010 notable for 99% mid RCA occlusion with placement of 2 bare metal stents. Troponins negative x3, EKG reassuring. Cath shows CAD with patent stent, cleared by cardiology on 08/14/15 for surgery. -Cardiology consulted, appreciate assistance -s/p cath on 08/14/15   Hypothyroidism: TSH 10, elevated from 1.5 back in March 2017. Suspect there may be non-adherence as usual dosing is 1 microgram per kilogram. -Continue Synthroid 172.5 g for now though will have to assess adherence  Hyperlipidemia: Continue Lipitor 80 mg daily  Coronary artery disease s/p PCI [BMS in 12/2010]: Continue aspirin 36m.  Non-insulin-dependent type 2 diabetes: A1c 7.6, December 2016.  -Continue SSI-S  Depression: Continue sertraline 150 mg daily.  Hypertension: Blood pressures trending 110s-160s/70s-90s. -Continue losartan 25 mg daily -Continue metoprolol 100 mg daily   Dispo: Discharge to home today.  The patient does have a current PCP (Loleta Chance MD) and [facility]  The patient does not know have transportation limitations that hinder transportation to clinic appointments.    LOS: 3 days   VZada Finders MD 08/16/2015, 11:47 AM

## 2015-08-16 NOTE — Discharge Summary (Signed)
Name: Kendra Adams MRN: [id number] DOB: Apr 20, 1960 55 y.o. PCP: Loleta Chance, MD  Date of Admission: 08/12/2015  1:10 AM Date of Discharge: 08/16/2015 Attending Physician: Annia Belt, MD  Discharge Diagnosis: 1. Acute calculous cholecystitis 2. Chest Pain Principal Problem:   Acute calculous cholecystitis Active Problems:   Hypothyroidism   HLD (hyperlipidemia)   Depression   Hypertension associated with diabetes (HCC)   GERD   CAD (coronary artery disease)   Diabetes mellitus type 2 with complications (HCC)   Morbid obesity (HCC)   Tobacco use disorder   Elevated liver enzymes   Chest pain   Pain in the chest  Discharge Medications:   Medication List    TAKE these medications        acetaminophen 325 MG tablet  Commonly known as:  TYLENOL  Take 650 mg by mouth every 4 (four) hours as needed for mild pain.     AEROCHAMBER PLUS inhaler  Use as instructed     albuterol 108 (90 Base) MCG/ACT inhaler  Commonly known as:  PROVENTIL HFA;VENTOLIN HFA  Inhale 2 puffs into the lungs every 4 (four) hours as needed for wheezing or shortness of breath. Dispense with aerochamber     aspirin 81 MG chewable tablet  Chew 81 mg by mouth daily.     atorvastatin 80 MG tablet  Commonly known as:  LIPITOR  Take 1 tablet (80 mg total) by mouth daily.     Blood Glucose Monitoring Suppl Devi  1 Device by Does not apply route daily before breakfast.     BLOOD GLUCOSE TEST STRIPS Strp  1 strip by In Vitro route daily before breakfast.     Fish Oil 1000 MG Caps  Take 1 capsule by mouth daily.     glipiZIDE 5 MG tablet  Commonly known as:  GLUCOTROL  Take 0.5 tablets (2.5 mg total) by mouth daily.     Insulin Pen Needle 32G X 4 MM Misc  Commonly known as:  BD PEN NEEDLE NANO U/F  1 Units/day by Does not apply route daily.     ipratropium 0.06 % nasal spray  Commonly known as:  ATROVENT  Place 2 sprays into both nostrils 4 (four) times daily.     Lancets Misc  1  Units by Does not apply route daily.     levothyroxine 25 MCG tablet  Commonly known as:  LEVOTHROID  Take 0.5 tablets (12.5 mcg total) by mouth daily. In addition to the 153mg tablet (total 162.578m daily)     levothyroxine 150 MCG tablet  Commonly known as:  SYNTHROID, LEVOTHROID  TAKE 1 TABLET BY MOUTH DAILY BEFORE BREAKFAST.     loratadine 10 MG tablet  Commonly known as:  CLARITIN  Take 1 tablet (10 mg total) by mouth daily.     losartan 25 MG tablet  Commonly known as:  COZAAR  Take 1 tablet (25 mg total) by mouth daily.     metFORMIN 1000 MG tablet  Commonly known as:  GLUCOPHAGE  TAKE 1 TABLET (1,000 MG TOTAL) BY MOUTH 2 (TWO) TIMES DAILY.     metoprolol succinate 100 MG 24 hr tablet  Commonly known as:  TOPROL-XL  Take 1 tablet (100 mg total) by mouth daily. Take with or immediately following a meal.     nitroGLYCERIN 0.4 MG SL tablet  Commonly known as:  NITROSTAT  Place 1 tablet (0.4 mg total) under the tongue every 5 (five) minutes as needed for chest pain.  omeprazole 40 MG capsule  Commonly known as:  PRILOSEC  TAKE 1 CAPSULE BY MOUTH DAILY.     rOPINIRole 1 MG tablet  Commonly known as:  REQUIP  TAKE 1 TABLET (1 MG TOTAL) BY MOUTH AT BEDTIME.     sertraline 100 MG tablet  Commonly known as:  ZOLOFT  Take 1.5 tablets (150 mg total) by mouth daily.        Disposition and follow-up:   [name] was discharged from [facility] in Stable condition.  At the hospital follow up visit please address:  1.   Continues to tolerate diet. Monitor for recurrence of abdominal and chest pain. Follow up with surgery.  Assess adherence to medications including Synthroid.  2.  Labs / imaging needed at time of follow-up: Consider rechecking TSH in 4-6 weeks  3.  Pending labs/ test needing follow-up: none  Follow-up Appointments: Follow-up Information    Follow up with [facility] On 08/29/2015.   Specialty:  General Surgery     Why:  Your appointment is at 10:45 AM, be at the office 30 minutes early for check in.   Contact information:   [address] [address] [phone]       Follow up with Loleta Chance, MD.   Specialty:  Internal Medicine   Why:  As needed   Contact information:   [address] [place] [id number] [phone]       Discharge Instructions: Discharge Instructions    Call MD for:  difficulty breathing, headache or visual disturbances    Complete by:  As directed      Call MD for:  extreme fatigue    Complete by:  As directed      Call MD for:  persistant dizziness or light-headedness    Complete by:  As directed      Call MD for:  persistant nausea and vomiting    Complete by:  As directed      Call MD for:  redness, tenderness, or signs of infection (pain, swelling, redness, odor or green/yellow discharge around incision site)    Complete by:  As directed      Call MD for:  severe uncontrolled pain    Complete by:  As directed      Call MD for:  temperature >100.4    Complete by:  As directed      Diet - low sodium heart healthy    Complete by:  As directed      Increase activity slowly    Complete by:  As directed            Consultations: Treatment Team:  Md Edison Pace, MD  Procedures Performed:  Dg Chest 2 View  08/17/2015  CLINICAL DATA:  Two days status post laparoscopic cholecystectomy. Current fever and vomiting EXAM: CHEST  2 VIEW COMPARISON:  August 12, 2015 FINDINGS: There is increased atelectasis in the lingula with questionable earliest changes of pneumonia in this area. Lungs elsewhere clear. Heart size and pulmonary vascularity are normal. No adenopathy. There is degenerative change in the thoracic spine and in each shoulder. IMPRESSION: Lingular atelectasis with questionable earliest changes of pneumonia in this area. Lungs elsewhere clear. Cardiac silhouette within normal limits. Electronically Signed   By: Lowella Grip III M.D.   On:  08/17/2015 07:46   Dg Chest 2 View  08/12/2015  CLINICAL DATA:  Acute onset of substernal chest pain and shortness of  breath. Nausea and vomiting. Initial encounter. EXAM: CHEST  2 VIEW COMPARISON:  Chest radiograph performed 02/03/2015 FINDINGS: The lungs are well-aerated. Minimal bibasilar atelectasis is noted. Mild peribronchial thickening is seen. There is no evidence of pleural effusion or pneumothorax. The heart is normal in size; the mediastinal contour is within normal limits. No acute osseous abnormalities are seen. IMPRESSION: Minimal bibasilar atelectasis noted. Mild peribronchial thickening seen. Electronically Signed   By: Garald Balding M.D.   On: 08/12/2015 02:19   Nm Hepatobiliary Liver Func  08/12/2015  CLINICAL DATA:  Evaluate for acute cholecystitis. Intermittent right upper quadrant pain. EXAM: NUCLEAR MEDICINE HEPATOBILIARY IMAGING TECHNIQUE: Sequential images of the abdomen were obtained out to 60 minutes following intravenous administration of radiopharmaceutical. RADIOPHARMACEUTICALS:  5.4  mCi Tc-35m Choletec IV COMPARISON:  Ultrasound 08/12/2015 FINDINGS: Prompt uptake and biliary excretion of activity by the liver is seen. Biliary activity passes into small bowel, consistent with patent common bile duct. After 30 minutes of imaging no gallbladder activity was identified. Subsequently the patient received 4.6 mg of morphine an additional 1 millicurie of Choletec. Imaging was carried out for additional 30 minutes without visualization of the gallbladder. IMPRESSION: 1. Nonvisualization of the gallbladder following morphine administration compatible with cystic duct obstruction and acute cholecystitis. These results will be called to the ordering clinician or representative by the Radiologist Assistant, and communication documented in the PACS or zVision Dashboard. Electronically Signed   By: TKerby MoorsM.D.   On: 08/12/2015 15:00   UKoreaAbdomen Complete  08/12/2015  CLINICAL DATA:   Epigastric and right upper quadrant pain for 1 week. EXAM: ABDOMEN ULTRASOUND COMPLETE COMPARISON:  None. FINDINGS: Gallbladder: Cholelithiasis without over distension or focal tenderness to suggest acute cholecystitis. Common bile duct: Diameter: 6 mm, upper limits of normal. Where visualized, no filling defect. Liver: 12 mm hyperechoic lesion in the upper left liver which is homogeneous appearing. No visible cirrhosis. IVC: Limited visualization is negative. Pancreas: Limited visualization. Either GDA or main duct is visualized at the head. If main duct, upper limits of normal at 3 mm. Spleen: Size and appearance within normal limits. Right Kidney: Length: 10 cm. Echogenicity within normal limits. No mass or hydronephrosis visualized. Left Kidney: Length: 11 cm. Echogenicity within normal limits. No mass or hydronephrosis visualized. Abdominal aorta: Only the proximal aorta could be seen due to bowel gas. No evidence of aneurysm. IMPRESSION: 1. Cholelithiasis without acute cholecystitis. 2. 12 mm hyperechoic lesion in the left liver favoring hemangioma. Suggest follow-up ultrasound in 6 months. 3. Limited visualization due to bowel gas and body habitus. Electronically Signed   By: JMonte FantasiaM.D.   On: 08/12/2015 03:34   Ct Abdomen Pelvis W Contrast  08/17/2015  CLINICAL DATA:  55year old female with a history of prior laparoscopic cholecystectomy 2 days ago EXAM: CT ABDOMEN AND PELVIS WITH CONTRAST TECHNIQUE: Multidetector CT imaging of the abdomen and pelvis was performed using the standard protocol following bolus administration of intravenous contrast. CONTRAST:  1 ISOVUE-300 IOPAMIDOL (ISOVUE-300) INJECTION 61% COMPARISON:  No prior CT. Ultrasound 08/17/2015 FINDINGS: Lower chest: Unremarkable appearance of the soft tissues of the chest wall. Heart size within normal limits.  No pericardial fluid/thickening. Calcifications in the distribution of the left anterior descending, circumflex, right coronary  arteries. No lower mediastinal adenopathy. Unremarkable appearance of the distal esophagus. Small hiatal hernia. No confluent airspace disease, pleural fluid, or pneumothorax within visualized lung. Abdomen/pelvis: Cranial caudal span of the liver measures 20 cm. Unremarkable spleen. Unremarkable appearance of bilateral  adrenal glands. Cholecystectomy. Minimal inflammatory changes in the region of the gallbladder surgical bed. No focal fluid collection. Unremarkable appearance of the pancreas. No intrahepatic or extrahepatic biliary ductal dilatation. No intra-peritoneal free air or significant free-fluid. No abnormally dilated small bowel or colon. No transition point. No inflammatory changes of the mesenteries. "Hammock" configuration of the course of the duodenum. Enteric contrast filled duodenum diverticulum. Normal appendix identified. Colonic diverticula without associated inflammatory changes. Right Kidney/Ureter: No hydronephrosis. No nephrolithiasis. No perinephric stranding. Unremarkable course of the right ureter. Left Kidney/Ureter: No hydronephrosis. No nephrolithiasis. No perinephric stranding. Unremarkable course of the left ureter. Unremarkable appearance of the urinary bladder. Unremarkable appearance of the uterus. Tubular fluid-filled configuration of the left adnexa, compatible with hydrosalpinx. Unremarkable right adnexa. No inflammatory changes associated with the left adnexa. Scattered vascular calcifications. No aneurysm. No periaortic fluid. Musculoskeletal: No displaced fracture. Surgical changes of prior percutaneous port placement in the right upper quadrant and at the emboli kiss, status post laparoscopic cholecystectomy. Multilevel degenerative changes of the spine. Nonspecific pelvic floor laxity. IMPRESSION: Early surgical changes of recent laparoscopic cholecystectomy, without focal fluid collection or evidence of abscess. Left-sided hydrosalpinx. Sterility not assessed by CT,  however, there are no adjacent inflammatory changes. Referral for Ob GYN evaluation may be useful. Diverticular disease without evidence of acute diverticulitis. Atherosclerosis and coronary artery disease. Signed, Dulcy Fanny. Earleen Newport, DO [facility]iology Electronically Signed   By: Corrie Mckusick D.O.   On: 08/17/2015 13:40   US Abdomen Limited Ruq  08/17/2015  CLINICAL DATA:  Pain following recent cholecystectomy EXAM: US ABDOMEN LIMITED - RIGHT UPPER QUADRANT COMPARISON:  August 12, 2015 FINDINGS: Gallbladder: Surgically absent. No lesion is seen by ultrasound in the gallbladder fossa region. Common bile duct: Diameter: 5 mm. No intrahepatic or extrahepatic biliary duct dilatation. Liver: There is an echogenic focus in the left lobe of the liver measuring 1.8 x 0.9 x 1.5 cm. No other focal liver lesion evident. Within normal limits in parenchymal echogenicity. IMPRESSION: Echogenic focus in the left lobe of the liver, likely a hemangioma. Gallbladder absent. Known lesion is seen by ultrasound in the gallbladder fossa. Note that there is gas from bowel in this area. No biliary duct dilatation evident. Electronically Signed   By: Lowella Grip III M.D.   On: 08/17/2015 09:52    2D Echo: n/a  Cardiac Cath: Left Heart Cath 08/14/15  Ost Cx to Prox Cx lesion, 10% stenosed.  The left ventricular systolic function is normal.  1. Nonobstructive CAD. The stent in the RCA is widely patent. 2. Normal LV function  Plan: she is clear to proceed with cholecystectomy from a cardiac standpoint.   Admission HPI:  Patient is a 55 yo F with a PMHx of CAD, MI, HTN, HLD, hypothyroidism, GERD, DM presenting with a chief complaint of chest pain and abdominal pain. Patient reports having intermittent, "dull/ aching" chest pain for the past 7 days which radiates to her L shoulder, L arm, neck, and jaw. States her neck feels "full." States the chest pain usually occurs  whenever she has abdominal pain and even occurs at rest. The pain lasts anywhere between seconds to up to one hour. Denies having any associated SOB, diaphoresis, nausea, or vomiting. States she is a smoker and has a chronic cough productive of clear sputum; there is no recent change in the frequency of her cough or amount of sputum produced. Also reports wheezing and uses her Albuterol inhaler twice a week.  Reports taking Omeprazole daily but still continues to have GERD symptoms on a regular basis. States she likes to eat lots of fried foods, spicy foods, and spaghetti.  Patient also reports having intermittent RUQ abdominal pain for the past 5-7 days. States the pain initially started after she "ate a healthy serving of spaghetti." She took Omeprazole which made her feel better. Reports passing flatus and having regular bowel movements, although did have some episodes of diarrhea and loose stools.   Records show patient has a cardiac cath in 12/2010 showing EF 65-70%, 99% mRCA occlusion s/p 2 BMS to the RCA. She had a stress test in 2015 which was normal. She saw cardiology 02/2015 and was supposed to get a repeat stress test but it was never done.  Hospital Course by problem list: Principal Problem:   Acute calculous cholecystitis Active Problems:   Hypothyroidism   HLD (hyperlipidemia)   Depression   Hypertension associated with diabetes ([place])   GERD   CAD (coronary artery disease)   Diabetes mellitus type 2 with complications (HCC)   Morbid obesity ([place])   Tobacco use disorder   Elevated liver enzymes   Chest pain   Pain in the chest   Acute cholecystitis with cholelithiasis: Ab [place] 6/4 with cholelithiasis though common bile duct diameter was within the upper limit of normal, however study was limited due to large body habitus. HIDA scan confirmed acute cholecystitis. She was started on IV unasyn and surgery was postoned until clearance by cardiology, as written below. After cardiac  clearance, she underwent laparscopic cholecystectomy on 03/10/89 without complication. She was able to advance her diet overnight and felt well prior to discharge on 08/16/15. Surgical pathology was without evidence for malignancy. Hep B core antibody, surface antigen, and surface antibody were negative.   Chest pressure: Possibly in the setting of symptomatic cholelithiasis though unclear if this was related to angina as it has been occurring at rest and without exertion. Troponins were negative x3 and EKG was without acute ischemic changes. Cardiology was consulted and initially recommended 2 day stress test, however decided to proceed with left heart cath instead. Prior Cardiac cath on 12/2010 was notable for 99% mid RCA occlusion with placement of 2 bare metal stents. Left Heart Cath on 08/14/15 showed CAD with a previously placed patent stent to the right coronary artery. She was cleared by cardiology on 08/14/15 to proceed with laparascopic cholecystectomy.   Hypothyroidism: TSH 10, elevated from 1.5 back in March 2017. Suspect there may be non-adherence as usual dosing is 1 microgram per kilogram. We continued Synthroid 172.5 g due to question of adherence at home.  Discharge Vitals:   BP 135/64 mmHg  Pulse 75  Temp(Src) 98.5 F (36.9 C) (Oral)  Resp 20  Ht 5' 2"  (1.575 m)  Wt 247 lb 6.4 oz (112.22 kg)  BMI 45.24 kg/m2  SpO2 97%  Discharge Labs:  No results found for this or any previous visit (from the past 24 hour(s)).  Signed: Zada Finders, MD 08/17/2015, 2:10 PM    Services Ordered on Discharge: none Equipment Ordered on Discharge: none

## 2015-08-16 NOTE — Progress Notes (Signed)
Patient to D/C home with husband. D/C education done. Tele monitor removed. CCMD notified. IV removed. Patient belongings given to patient. Patient to D/C home with husband.   Domingo Dimes RN

## 2015-08-16 NOTE — Anesthesia Postprocedure Evaluation (Signed)
Anesthesia Post Note  Patient: Kendra Adams  Procedure(s) Performed: Procedure(s) (LRB): LAPAROSCOPIC CHOLECYSTECTOMY WITH ATTEMPTED INTRAOPERATIVE CHOLANGIOGRAM (N/A)  Patient location during evaluation: PACU Anesthesia Type: General Level of consciousness: awake and alert Pain management: pain level controlled Vital Signs Assessment: post-procedure vital signs reviewed and stable Respiratory status: spontaneous breathing, nonlabored ventilation, respiratory function stable and patient connected to nasal cannula oxygen Cardiovascular status: blood pressure returned to baseline and stable Postop Assessment: no signs of nausea or vomiting Anesthetic complications: no     Last Vitals:  Filed Vitals:   08/15/15 2004 08/16/15 0454  BP: 122/52 137/65  Pulse: 78 70  Temp: 36.8 C 36.9 C  Resp: 18 20    Last Pain:  Filed Vitals:   08/16/15 0457  PainSc: Asleep   Pain Goal:                 Catalina Gravel

## 2015-08-17 ENCOUNTER — Encounter (HOSPITAL_COMMUNITY): Payer: Self-pay

## 2015-08-17 ENCOUNTER — Inpatient Hospital Stay (HOSPITAL_COMMUNITY)
Admission: EM | Admit: 2015-08-17 | Discharge: 2015-08-21 | DRG: 445 | Disposition: A | Discharge status: Home / Self Care | Payer: Self-pay | Attending: Internal Medicine | Admitting: Internal Medicine

## 2015-08-17 ENCOUNTER — Emergency Department (HOSPITAL_COMMUNITY): Payer: Self-pay

## 2015-08-17 DIAGNOSIS — E1169 Type 2 diabetes mellitus with other specified complication: Secondary | ICD-10-CM | POA: Diagnosis present

## 2015-08-17 DIAGNOSIS — Z1611 Resistance to penicillins: Secondary | ICD-10-CM | POA: Diagnosis present

## 2015-08-17 DIAGNOSIS — R7401 Elevation of levels of liver transaminase levels: Secondary | ICD-10-CM | POA: Diagnosis present

## 2015-08-17 DIAGNOSIS — F329 Major depressive disorder, single episode, unspecified: Secondary | ICD-10-CM

## 2015-08-17 DIAGNOSIS — Z7984 Long term (current) use of oral hypoglycemic drugs: Secondary | ICD-10-CM

## 2015-08-17 DIAGNOSIS — I2510 Atherosclerotic heart disease of native coronary artery without angina pectoris: Secondary | ICD-10-CM

## 2015-08-17 DIAGNOSIS — I10 Essential (primary) hypertension: Secondary | ICD-10-CM

## 2015-08-17 DIAGNOSIS — E039 Hypothyroidism, unspecified: Secondary | ICD-10-CM | POA: Diagnosis present

## 2015-08-17 DIAGNOSIS — R3915 Urgency of urination: Secondary | ICD-10-CM

## 2015-08-17 DIAGNOSIS — R740 Nonspecific elevation of levels of transaminase and lactic acid dehydrogenase [LDH]: Secondary | ICD-10-CM

## 2015-08-17 DIAGNOSIS — K219 Gastro-esophageal reflux disease without esophagitis: Secondary | ICD-10-CM | POA: Diagnosis present

## 2015-08-17 DIAGNOSIS — R509 Fever, unspecified: Secondary | ICD-10-CM

## 2015-08-17 DIAGNOSIS — I52 Other heart disorders in diseases classified elsewhere: Secondary | ICD-10-CM | POA: Diagnosis present

## 2015-08-17 DIAGNOSIS — Z87891 Personal history of nicotine dependence: Secondary | ICD-10-CM

## 2015-08-17 DIAGNOSIS — Z7982 Long term (current) use of aspirin: Secondary | ICD-10-CM

## 2015-08-17 DIAGNOSIS — Z79899 Other long term (current) drug therapy: Secondary | ICD-10-CM

## 2015-08-17 DIAGNOSIS — Z8249 Family history of ischemic heart disease and other diseases of the circulatory system: Secondary | ICD-10-CM

## 2015-08-17 DIAGNOSIS — Z833 Family history of diabetes mellitus: Secondary | ICD-10-CM

## 2015-08-17 DIAGNOSIS — E785 Hyperlipidemia, unspecified: Secondary | ICD-10-CM | POA: Diagnosis present

## 2015-08-17 DIAGNOSIS — Z955 Presence of coronary angioplasty implant and graft: Secondary | ICD-10-CM

## 2015-08-17 DIAGNOSIS — K839 Disease of biliary tract, unspecified: Secondary | ICD-10-CM

## 2015-08-17 DIAGNOSIS — I152 Hypertension secondary to endocrine disorders: Secondary | ICD-10-CM | POA: Diagnosis present

## 2015-08-17 DIAGNOSIS — E119 Type 2 diabetes mellitus without complications: Secondary | ICD-10-CM

## 2015-08-17 DIAGNOSIS — K830 Cholangitis: Principal | ICD-10-CM | POA: Diagnosis present

## 2015-08-17 DIAGNOSIS — E1159 Type 2 diabetes mellitus with other circulatory complications: Secondary | ICD-10-CM | POA: Diagnosis present

## 2015-08-17 DIAGNOSIS — Z888 Allergy status to other drugs, medicaments and biological substances status: Secondary | ICD-10-CM

## 2015-08-17 DIAGNOSIS — R7881 Bacteremia: Secondary | ICD-10-CM | POA: Diagnosis present

## 2015-08-17 DIAGNOSIS — R5082 Postprocedural fever: Secondary | ICD-10-CM

## 2015-08-17 DIAGNOSIS — N7011 Chronic salpingitis: Secondary | ICD-10-CM | POA: Diagnosis present

## 2015-08-17 DIAGNOSIS — J9811 Atelectasis: Secondary | ICD-10-CM

## 2015-08-17 DIAGNOSIS — B9620 Unspecified Escherichia coli [E. coli] as the cause of diseases classified elsewhere: Secondary | ICD-10-CM | POA: Diagnosis present

## 2015-08-17 DIAGNOSIS — Z9049 Acquired absence of other specified parts of digestive tract: Secondary | ICD-10-CM

## 2015-08-17 LAB — CBC WITH DIFFERENTIAL/PLATELET
Basophils Absolute: 0 10*3/uL (ref 0.0–0.1)
Basophils Relative: 0 %
Eosinophils Absolute: 0.1 10*3/uL (ref 0.0–0.7)
Eosinophils Relative: 1 %
HCT: 38.8 % (ref 36.0–46.0)
Hemoglobin: 12.7 g/dL (ref 12.0–15.0)
Lymphocytes Relative: 4 %
Lymphs Abs: 0.5 10*3/uL — ABNORMAL LOW (ref 0.7–4.0)
MCH: 29.6 pg (ref 26.0–34.0)
MCHC: 32.7 g/dL (ref 30.0–36.0)
MCV: 90.4 fL (ref 78.0–100.0)
Monocytes Absolute: 1.1 10*3/uL — ABNORMAL HIGH (ref 0.1–1.0)
Monocytes Relative: 9 %
Neutro Abs: 10.8 10*3/uL — ABNORMAL HIGH (ref 1.7–7.7)
Neutrophils Relative %: 86 %
Platelets: 208 10*3/uL (ref 150–400)
RBC: 4.29 MIL/uL (ref 3.87–5.11)
RDW: 13.7 % (ref 11.5–15.5)
WBC: 12.5 10*3/uL — ABNORMAL HIGH (ref 4.0–10.5)

## 2015-08-17 LAB — LIPASE, BLOOD: Lipase: 20 U/L (ref 11–51)

## 2015-08-17 LAB — COMPREHENSIVE METABOLIC PANEL
ALT: 241 U/L — ABNORMAL HIGH (ref 14–54)
AST: 287 U/L — ABNORMAL HIGH (ref 15–41)
Albumin: 3.5 g/dL (ref 3.5–5.0)
Alkaline Phosphatase: 270 U/L — ABNORMAL HIGH (ref 38–126)
Anion gap: 11 (ref 5–15)
BUN: 6 mg/dL (ref 6–20)
CO2: 24 mmol/L (ref 22–32)
Calcium: 9.1 mg/dL (ref 8.9–10.3)
Chloride: 103 mmol/L (ref 101–111)
Creatinine, Ser: 0.63 mg/dL (ref 0.44–1.00)
GFR calc Af Amer: >60 mL/min (ref >60)
GFR calc non Af Amer: >60 mL/min (ref >60)
Glucose, Bld: 175 mg/dL — ABNORMAL HIGH (ref 65–99)
Potassium: 3.8 mmol/L (ref 3.5–5.1)
Sodium: 138 mmol/L (ref 135–145)
Total Bilirubin: 2.9 mg/dL — ABNORMAL HIGH (ref 0.3–1.2)
Total Protein: 6.5 g/dL (ref 6.5–8.1)

## 2015-08-17 LAB — URINALYSIS, ROUTINE W REFLEX MICROSCOPIC
Glucose, UA: NEGATIVE mg/dL
Hgb urine dipstick: NEGATIVE
Ketones, ur: NEGATIVE mg/dL
Leukocytes, UA: NEGATIVE
Nitrite: NEGATIVE
Protein, ur: NEGATIVE mg/dL
Specific Gravity, Urine: 1.011 (ref 1.005–1.030)
pH: 7 (ref 5.0–8.0)

## 2015-08-17 LAB — I-STAT CG4 LACTIC ACID, ED
Lactic Acid, Venous: 1.24 mmol/L (ref 0.5–2.0)
Lactic Acid, Venous: 1.16 mmol/L (ref 0.5–2.0)

## 2015-08-17 MED ORDER — ALBUTEROL SULFATE (2.5 MG/3ML) 0.083% IN NEBU: 3.0000 mL | INHALATION_SOLUTION | RESPIRATORY_TRACT | Status: DC | PRN

## 2015-08-17 MED ORDER — TECHNETIUM TC 99M MEBROFENIN IV KIT
5.2800 | PACK | Freq: Once | INTRAVENOUS | Status: AC | PRN
  Administered 2015-08-17: 5 via INTRAVENOUS

## 2015-08-17 MED ORDER — ONDANSETRON HCL 4 MG/2ML IJ SOLN
4.0000 mg | Freq: Four times a day (QID) | INTRAMUSCULAR | Status: DC | PRN
  Administered 2015-08-17: 4 mg via INTRAVENOUS
  Filled 2015-08-17: qty 2

## 2015-08-17 MED ORDER — IPRATROPIUM BROMIDE 0.06 % NA SOLN
2.0000 | Freq: Four times a day (QID) | NASAL | Status: DC
  Administered 2015-08-18 – 2015-08-20 (×10): 2 via NASAL
  Filled 2015-08-17 (×2): qty 15

## 2015-08-17 MED ORDER — METOPROLOL SUCCINATE ER 100 MG PO TB24: 100.0000 mg | ORAL_TABLET | Freq: Every day | ORAL | Status: DC

## 2015-08-17 MED ORDER — NITROGLYCERIN 0.4 MG SL SUBL: 0.4000 mg | SUBLINGUAL_TABLET | SUBLINGUAL | Status: DC | PRN

## 2015-08-17 MED ORDER — LEVOTHYROXINE SODIUM 25 MCG PO TABS: 12.5000 ug | ORAL_TABLET | Freq: Every day | ORAL | Status: DC

## 2015-08-17 MED ORDER — MORPHINE SULFATE (PF) 2 MG/ML IV SOLN
2.0000 mg | INTRAVENOUS | Status: DC | PRN
  Administered 2015-08-17 – 2015-08-18 (×3): 2 mg via INTRAVENOUS
  Filled 2015-08-17 (×3): qty 1

## 2015-08-17 MED ORDER — LORATADINE 10 MG PO TABS
10.0000 mg | ORAL_TABLET | Freq: Every day | ORAL | Status: DC
  Administered 2015-08-18 – 2015-08-21 (×4): 10 mg via ORAL
  Filled 2015-08-17 (×4): qty 1

## 2015-08-17 MED ORDER — LEVOTHYROXINE SODIUM 75 MCG PO TABS
162.5000 ug | ORAL_TABLET | Freq: Every day | ORAL | Status: DC
  Administered 2015-08-18 – 2015-08-21 (×4): 162.5 ug via ORAL
  Filled 2015-08-17 (×4): qty 1

## 2015-08-17 MED ORDER — LOSARTAN POTASSIUM 25 MG PO TABS
25.0000 mg | ORAL_TABLET | Freq: Every day | ORAL | Status: DC
  Administered 2015-08-18 – 2015-08-21 (×4): 25 mg via ORAL
  Filled 2015-08-17 (×5): qty 1

## 2015-08-17 MED ORDER — LEVOFLOXACIN IN D5W 750 MG/150ML IV SOLN: 750.0000 mg | Freq: Once | INTRAVENOUS | Status: DC

## 2015-08-17 MED ORDER — DIATRIZOATE MEGLUMINE & SODIUM 66-10 % PO SOLN
ORAL | Status: AC
  Filled 2015-08-17: qty 30

## 2015-08-17 MED ORDER — ONDANSETRON HCL 4 MG PO TABS: 4.0000 mg | ORAL_TABLET | Freq: Four times a day (QID) | ORAL | Status: DC | PRN

## 2015-08-17 MED ORDER — MORPHINE SULFATE (PF) 4 MG/ML IV SOLN
4.0000 mg | Freq: Once | INTRAVENOUS | Status: AC
Start: 2015-08-17 — End: 2015-08-17
  Administered 2015-08-17: 4 mg via INTRAVENOUS
  Filled 2015-08-17: qty 1

## 2015-08-17 MED ORDER — METOPROLOL SUCCINATE ER 100 MG PO TB24
100.0000 mg | ORAL_TABLET | Freq: Every day | ORAL | Status: DC
  Administered 2015-08-17 – 2015-08-21 (×5): 100 mg via ORAL
  Filled 2015-08-17 (×5): qty 1

## 2015-08-17 MED ORDER — ROPINIROLE HCL 1 MG PO TABS
1.0000 mg | ORAL_TABLET | Freq: Every day | ORAL | Status: DC
  Administered 2015-08-17 – 2015-08-20 (×4): 1 mg via ORAL
  Filled 2015-08-17 (×5): qty 1

## 2015-08-17 MED ORDER — ASPIRIN 81 MG PO CHEW
81.0000 mg | CHEWABLE_TABLET | Freq: Every day | ORAL | Status: DC
  Administered 2015-08-18 – 2015-08-21 (×4): 81 mg via ORAL
  Filled 2015-08-17 (×4): qty 1

## 2015-08-17 MED ORDER — LEVOTHYROXINE SODIUM 75 MCG PO TABS: 150.0000 ug | ORAL_TABLET | Freq: Every day | ORAL | Status: DC

## 2015-08-17 MED ORDER — IOPAMIDOL (ISOVUE-300) INJECTION 61%
INTRAVENOUS | Status: AC
  Administered 2015-08-17: 100 mL via INTRAVENOUS
  Filled 2015-08-17: qty 100

## 2015-08-17 MED ORDER — INSULIN ASPART 100 UNIT/ML ~~LOC~~ SOLN
0.0000 [IU] | Freq: Three times a day (TID) | SUBCUTANEOUS | Status: DC
  Administered 2015-08-18 – 2015-08-20 (×6): 2 [IU] via SUBCUTANEOUS
  Administered 2015-08-20 – 2015-08-21 (×2): 1 [IU] via SUBCUTANEOUS

## 2015-08-17 MED ORDER — SODIUM CHLORIDE 0.9 % IV BOLUS (SEPSIS)
1000.0000 mL | Freq: Once | INTRAVENOUS | Status: AC
  Administered 2015-08-17: 1000 mL via INTRAVENOUS

## 2015-08-17 MED ORDER — PANTOPRAZOLE SODIUM 40 MG PO TBEC
40.0000 mg | DELAYED_RELEASE_TABLET | Freq: Every day | ORAL | Status: DC
  Administered 2015-08-18 – 2015-08-21 (×4): 40 mg via ORAL
  Filled 2015-08-17 (×4): qty 1

## 2015-08-17 MED ORDER — ENOXAPARIN SODIUM 40 MG/0.4ML ~~LOC~~ SOLN
40.0000 mg | SUBCUTANEOUS | Status: DC
  Administered 2015-08-17 – 2015-08-20 (×4): 40 mg via SUBCUTANEOUS
  Filled 2015-08-17 (×5): qty 0.4

## 2015-08-17 MED ORDER — SERTRALINE HCL 50 MG PO TABS
150.0000 mg | ORAL_TABLET | Freq: Every day | ORAL | Status: DC
  Administered 2015-08-17 – 2015-08-21 (×5): 150 mg via ORAL
  Filled 2015-08-17 (×5): qty 1

## 2015-08-17 MED ORDER — ONDANSETRON HCL 4 MG/2ML IJ SOLN
4.0000 mg | Freq: Once | INTRAMUSCULAR | Status: AC
  Administered 2015-08-17: 4 mg via INTRAVENOUS
  Filled 2015-08-17: qty 2

## 2015-08-17 MED ORDER — SERTRALINE HCL 50 MG PO TABS: 150.0000 mg | ORAL_TABLET | Freq: Every day | ORAL | Status: DC

## 2015-08-17 NOTE — ED Notes (Signed)
Pt ambulatory w/ steady gait to restroom.

## 2015-08-17 NOTE — ED Notes (Signed)
Dr. Liu at bedside 

## 2015-08-17 NOTE — ED Provider Notes (Signed)
CSN: [id number]     Arrival date & time 08/17/15  0300 History   First MD Initiated Contact with Patient 08/17/15 [phone]     Chief Complaint  Patient presents with  . Emesis  . Post-op Problem    fever     (Consider location/radiation/quality/duration/timing/severity/associated sxs/prior Treatment) HPI  55 year old female who presents with post-op fever. She is postop day 3 from left cholecystectomy for acute on chronic cholecystitis. Was discharged from the hospital one day ago, initially doing well. Says that she woke up early this morning with fever of 101.4 Fahrenheit. Had one episode of vomiting. Has noted some shortness of breath due to abdominal distention she states. No cough, congestion, chest pain. States right upper quadrant abdominal pain, but states that she has not taken any pain medication other than Tylenol for this pain and currently 8 out of 10. States that it was similar to the pain that she had postop in the hospital. She had urinary frequency this morning, but denies dysuria or flank pain. No diarrhea, leg swelling or leg pain. Took tylenol at 7AM  Past Medical History  Diagnosis Date  . Allergic rhinitis   . Tobacco abuse   . Depression   . GERD (gastroesophageal reflux disease)   . Hyperlipidemia   . Hypothyroidism   . CAD (coronary artery disease)     a. 12/13/10: mRCA 99%, EF 65-70%;  s/p BMS to mRCA  . HTN (hypertension)   . DM2 (diabetes mellitus, type 2) ([place])   . Hyperlipidemia    Past Surgical History  Procedure Laterality Date  . Coronary angioplasty with stent placement    . Carpal tunnel release      w/ bone spurs  . Dilation and curettage of uterus    . Cardiac catheterization N/A 08/14/2015    Procedure: Left Heart Cath and Coronary Angiography;  Surgeon: Peter M Martinique, MD;  Location: [facility];  Service: Cardiovascular;  Laterality: N/A;  . Cholecystectomy N/A 08/15/2015    Procedure: LAPAROSCOPIC CHOLECYSTECTOMY WITH ATTEMPTED  INTRAOPERATIVE CHOLANGIOGRAM;  Surgeon: Erroll Luna, MD;  Location: [facility];  Service: General;  Laterality: N/A;   Family History  Problem Relation Age of Onset  . Diabetes Mother   . Hypertension Mother   . Diabetes Father   . Hypertension Father    Social History  Substance Use Topics  . Smoking status: Former Smoker -- 0.50 packs/day for 40 years    Types: Cigarettes    Quit date: 05/19/2014  . Smokeless tobacco: Current User     Comment: Trying to cut back.  . Alcohol Use: No   OB History    No data available     Review of Systems 10/14 systems reviewed and are negative other than those stated in the HPI   Allergies  Gabapentin; Nsaids; and Tramadol hcl  Home Medications   Prior to Admission medications   Medication Sig Start Date End Date Taking? Authorizing Provider  acetaminophen (TYLENOL) 325 MG tablet Take 650 mg by mouth every 4 (four) hours as needed for mild pain.   Yes Historical Provider, MD  albuterol (PROVENTIL HFA;VENTOLIN HFA) 108 (90 Base) MCG/ACT inhaler Inhale 2 puffs into the lungs every 4 (four) hours as needed for wheezing or shortness of breath. Dispense with aerochamber 05/16/15  Yes Loleta Chance, MD  aspirin 81 MG chewable tablet Chew 81 mg by mouth daily.   Yes Historical Provider, MD  atorvastatin (LIPITOR) 80 MG tablet Take 1 tablet (80  mg total) by mouth daily. 11/20/14  Yes Loleta Chance, MD  Blood Glucose Monitoring Suppl DEVI 1 Device by Does not apply route daily before breakfast. 05/23/14  Yes Ejiroghene E Emokpae, MD  glipiZIDE (GLUCOTROL) 5 MG tablet Take 0.5 tablets (2.5 mg total) by mouth daily. 04/04/15 04/03/16 Yes Loleta Chance, MD  Glucose Blood (BLOOD GLUCOSE TEST STRIPS) STRP 1 strip by In Vitro route daily before breakfast. 05/23/14  Yes Ejiroghene E Emokpae, MD  Insulin Pen Needle (BD PEN NEEDLE NANO U/F) 32G X 4 MM MISC 1 Units/day by Does not apply route daily. 02/26/15  Yes Loleta Chance, MD  ipratropium (ATROVENT) 0.06 % nasal spray  Place 2 sprays into both nostrils 4 (four) times daily. 10/03/14  Yes Melony Overly, MD  Lancets MISC 1 Units by Does not apply route daily. 05/23/14  Yes Ejiroghene Arlyce Dice, MD  levothyroxine (LEVOTHROID) 25 MCG tablet Take 0.5 tablets (12.5 mcg total) by mouth daily. In addition to the 150mg tablet (total 162.559m daily) 02/28/15 02/28/16 Yes KyLoleta ChanceMD  levothyroxine (SYNTHROID, LEVOTHROID) 150 MCG tablet TAKE 1 TABLET BY MOUTH DAILY BEFORE BREAKFAST. 04/19/15  Yes KyLoleta ChanceMD  loratadine (CLARITIN) 10 MG tablet Take 1 tablet (10 mg total) by mouth daily. 09/07/14  Yes ErLucious GrovesDO  losartan (COZAAR) 25 MG tablet Take 1 tablet (25 mg total) by mouth daily. 02/28/15 02/28/16 Yes KyLoleta ChanceMD  metFORMIN (GLUCOPHAGE) 1000 MG tablet TAKE 1 TABLET (1,000 MG TOTAL) BY MOUTH 2 (TWO) TIMES DAILY. 09/18/14  Yes ElBartholomew CrewsMD  metoprolol succinate (TOPROL-XL) 100 MG 24 hr tablet Take 1 tablet (100 mg total) by mouth daily. Take with or immediately following a meal. 05/16/15  Yes KyLoleta ChanceMD  nitroGLYCERIN (NITROSTAT) 0.4 MG SL tablet Place 1 tablet (0.4 mg total) under the tongue every 5 (five) minutes as needed for chest pain. 01/19/15  Yes WiNorval GableMD  Omega-3 Fatty Acids (FISH OIL) 1000 MG CAPS Take 1 capsule by mouth daily.   Yes Historical Provider, MD  omeprazole (PRILOSEC) 40 MG capsule TAKE 1 CAPSULE BY MOUTH DAILY. 04/19/15  Yes KyLoleta ChanceMD  rOPINIRole (REQUIP) 1 MG tablet TAKE 1 TABLET (1 MG TOTAL) BY MOUTH AT BEDTIME. 04/02/15  Yes KyLoleta ChanceMD  sertraline (ZOLOFT) 100 MG tablet Take 1.5 tablets (150 mg total) by mouth daily. 01/22/15  Yes KyLoleta ChanceMD  Spacer/Aero-Holding Chambers (AEROCHAMBER PLUS) inhaler Use as instructed 02/03/15  Yes AsMelynda RippleMD   BP 155/83 mmHg  Pulse 87  Temp(Src) 98.1 F (36.7 C)  Resp 23  SpO2 93% Physical Exam Physical Exam  Nursing note and vitals reviewed. Constitutional: Well developed, well nourished,  non-toxic, and in no acute distress Head: Normocephalic and atraumatic.  Mouth/Throat: Oropharynx is clear and moist.  Neck: Normal range of motion. Neck supple.  Cardiovascular: Normal rate and regular rhythm.   Pulmonary/Chest: Effort normal and breath sounds normal.  Abdominal: Soft. Obese. There is RUQ tenderness. There is no rebound and no guarding. surgical wounds, clean dry and intact. Musculoskeletal: Normal range of motion.  Neurological: Alert, no facial droop, fluent speech, moves all extremities symmetrically Skin: Skin is warm and dry.  Psychiatric: Cooperative  ED Course  Procedures (including critical care time) Labs Review Labs Reviewed  CBC WITH DIFFERENTIAL/PLATELET - Abnormal; Notable for the following:    WBC 12.5 (*)    Neutro Abs 10.8 (*)    Lymphs Abs 0.5 (*)    Monocytes  Absolute 1.1 (*)    All other components within normal limits  COMPREHENSIVE METABOLIC PANEL - Abnormal; Notable for the following:    Glucose, Bld 175 (*)    AST 287 (*)    ALT 241 (*)    Alkaline Phosphatase 270 (*)    Total Bilirubin 2.9 (*)    All other components within normal limits  URINALYSIS, ROUTINE W REFLEX MICROSCOPIC (NOT AT [facility] - Abnormal; Notable for the following:    Bilirubin Urine SMALL (*)    All other components within normal limits  LIPASE, BLOOD  I-STAT CG4 LACTIC ACID, ED  I-STAT CG4 LACTIC ACID, ED    Imaging Review Dg Chest 2 View  08/17/2015  CLINICAL DATA:  Two days status post laparoscopic cholecystectomy. Current fever and vomiting EXAM: CHEST  2 VIEW COMPARISON:  August 12, 2015 FINDINGS: There is increased atelectasis in the lingula with questionable earliest changes of pneumonia in this area. Lungs elsewhere clear. Heart size and pulmonary vascularity are normal. No adenopathy. There is degenerative change in the thoracic spine and in each shoulder. IMPRESSION: Lingular atelectasis with questionable earliest changes of pneumonia in this area. Lungs elsewhere  clear. Cardiac silhouette within normal limits. Electronically Signed   By: Lowella Grip III M.D.   On: 08/17/2015 07:46   Ct Abdomen Pelvis W Contrast  08/17/2015  CLINICAL DATA:  55 year old female with a history of prior laparoscopic cholecystectomy 2 days ago EXAM: CT ABDOMEN AND PELVIS WITH CONTRAST TECHNIQUE: Multidetector CT imaging of the abdomen and pelvis was performed using the standard protocol following bolus administration of intravenous contrast. CONTRAST:  1 ISOVUE-300 IOPAMIDOL (ISOVUE-300) INJECTION 61% COMPARISON:  No prior CT. Ultrasound 08/17/2015 FINDINGS: Lower chest: Unremarkable appearance of the soft tissues of the chest wall. Heart size within normal limits.  No pericardial fluid/thickening. Calcifications in the distribution of the left anterior descending, circumflex, right coronary arteries. No lower mediastinal adenopathy. Unremarkable appearance of the distal esophagus. Small hiatal hernia. No confluent airspace disease, pleural fluid, or pneumothorax within visualized lung. Abdomen/pelvis: Cranial caudal span of the liver measures 20 cm. Unremarkable spleen. Unremarkable appearance of bilateral adrenal glands. Cholecystectomy. Minimal inflammatory changes in the region of the gallbladder surgical bed. No focal fluid collection. Unremarkable appearance of the pancreas. No intrahepatic or extrahepatic biliary ductal dilatation. No intra-peritoneal free air or significant free-fluid. No abnormally dilated small bowel or colon. No transition point. No inflammatory changes of the mesenteries. "Hammock" configuration of the course of the duodenum. Enteric contrast filled duodenum diverticulum. Normal appendix identified. Colonic diverticula without associated inflammatory changes. Right Kidney/Ureter: No hydronephrosis. No nephrolithiasis. No perinephric stranding. Unremarkable course of the right ureter. Left Kidney/Ureter: No hydronephrosis. No nephrolithiasis. No perinephric  stranding. Unremarkable course of the left ureter. Unremarkable appearance of the urinary bladder. Unremarkable appearance of the uterus. Tubular fluid-filled configuration of the left adnexa, compatible with hydrosalpinx. Unremarkable right adnexa. No inflammatory changes associated with the left adnexa. Scattered vascular calcifications. No aneurysm. No periaortic fluid. Musculoskeletal: No displaced fracture. Surgical changes of prior percutaneous port placement in the right upper quadrant and at the emboli kiss, status post laparoscopic cholecystectomy. Multilevel degenerative changes of the spine. Nonspecific pelvic floor laxity. IMPRESSION: Early surgical changes of recent laparoscopic cholecystectomy, without focal fluid collection or evidence of abscess. Left-sided hydrosalpinx. Sterility not assessed by CT, however, there are no adjacent inflammatory changes. Referral for Ob GYN evaluation may be useful. Diverticular disease without evidence of acute diverticulitis. Atherosclerosis and coronary artery disease. Signed, Dulcy Fanny. Earleen Newport,  DO [facility] Radiology Electronically Signed   By: Corrie Mckusick D.O.   On: 08/17/2015 13:40   US Abdomen Limited Ruq  08/17/2015  CLINICAL DATA:  Pain following recent cholecystectomy EXAM: US ABDOMEN LIMITED - RIGHT UPPER QUADRANT COMPARISON:  August 12, 2015 FINDINGS: Gallbladder: Surgically absent. No lesion is seen by ultrasound in the gallbladder fossa region. Common bile duct: Diameter: 5 mm. No intrahepatic or extrahepatic biliary duct dilatation. Liver: There is an echogenic focus in the left lobe of the liver measuring 1.8 x 0.9 x 1.5 cm. No other focal liver lesion evident. Within normal limits in parenchymal echogenicity. IMPRESSION: Echogenic focus in the left lobe of the liver, likely a hemangioma. Gallbladder absent. Known lesion is seen by ultrasound in the gallbladder fossa. Note that there is gas from bowel in  this area. No biliary duct dilatation evident. Electronically Signed   By: Lowella Grip III M.D.   On: 08/17/2015 09:52   I have personally reviewed and evaluated these images and lab results as part of my medical decision-making.   EKG Interpretation   Date/Time:  Friday August 17 2015 07:32:13 EDT Ventricular Rate:  100 PR Interval:  156 QRS Duration: 87 QT Interval:  335 QTC Calculation: 432 R Axis:   53 Text Interpretation:  Sinus tachycardia Ventricular premature complex  Borderline T wave abnormalities No significant change since last tracing  aside from PVC Confirmed by Analisse Randle MD, Hinton Dyer ([id number]) on 08/17/2015 7:55:23 AM  Also confirmed by Oleta Mouse MD, Yamilette Garretson [phone]), editor Stout CT, Leda Gauze [phone])   on 08/17/2015 8:26:40 AM      MDM   Final diagnoses:  S/P cholecystectomy  Transaminitis  Postoperative fever    55 year old female status post lap cholecystectomy who presents with fever. The presentation is afebrile and hemodynamically stable. Lungs are clear, abdomen overall soft and non-peritoneal. surgical wounds appear clean dry and intact. With right upper quadrant tenderness, but she states that this is her baseline since surgery. Infectious workup pursued. She does have a leukocytosis of 12.5. Urinalysis does not show infection. She has a normal lactate. Chest x-ray does show questionable atelectasis versus infiltrate in the left lingual area. With some mild shortness of breath and mild cough since being here in the ED, and warm. They treat for now with Levaquin. She does have new transaminitis with elevated alkaline phosphatase and elevated bilirubin. Ultrasound does not show biliary dilatation. Concern for biliary leak versus retained stone. I discussed with general surgery recommending CT abdomen and pelvis and HIDA scan to rule out biliary leak. CT visualized and shows no acute intraadominal process. Admitted to internal medicine for HIDA scan and potential MRCP.   Forde Dandy,  MD 08/17/15 [phone]

## 2015-08-17 NOTE — ED Notes (Signed)
Pt ambulated to and from bathroom.

## 2015-08-17 NOTE — ED Notes (Signed)
Pt is at nuclear medicine.

## 2015-08-17 NOTE — ED Notes (Signed)
The pt had her gallbladder removed on Wednesday 6/7. Per pt and pts husband the pt had chills and a fever around 0500 today. They took at 0520 her temp and it was 101.4, she took two tylenol (unsure of dosage) at 0600 100.4, at 0615 temp was back to 101.4. The pt then vomited one time.

## 2015-08-17 NOTE — ED Notes (Signed)
Pt still in [place]

## 2015-08-17 NOTE — ED Notes (Signed)
Pt still in [place]

## 2015-08-17 NOTE — ED Notes (Signed)
Pt ambulated to restroom. 

## 2015-08-17 NOTE — ED Notes (Signed)
Pt still in [place]

## 2015-08-17 NOTE — ED Notes (Signed)
Contacted CT re: pt only able to tolerate one bottle of contrast. Will still be able to scan pt.

## 2015-08-17 NOTE — Consult Note (Signed)
Reason for Consult: vomiting, abnormal LFTs Referring Physician: Dr. Brantley Stage   HPI: Kendra Adams is a 55 year old female s/p laparoscopic cholecystectomy 08/12/15 who presents with dysuria, urinary frequency which started suddenly at 0230 this AM.  This was followed by an episode of emesis after drinking some water.  Associated with fevers of 101.4, chills, sweats, shortness of breath, productive cough.  Abdomen has been sore, but no increased pain.  Appetite has been poor, but no vomiting prior to last night. She is passing flatus, but no bowel movement since Tuesday.  Work up shows a WBC 12.5k, which is decreased since discharge date.  LFTs have increased with TB 2.9.  UA was negative.  Afebrile.  VSS.   We have been asked to evaluate due to abdominal LFTs and recent surgery.   Past Medical History  Diagnosis Date  . Allergic rhinitis   . Tobacco abuse   . Depression   . GERD (gastroesophageal reflux disease)   . Hyperlipidemia   . Hypothyroidism   . CAD (coronary artery disease)     a. 12/13/10: mRCA 99%, EF 65-70%;  s/p BMS to mRCA  . HTN (hypertension)   . DM2 (diabetes mellitus, type 2) ([place])   . Hyperlipidemia     Past Surgical History  Procedure Laterality Date  . Coronary angioplasty with stent placement    . Carpal tunnel release      w/ bone spurs  . Dilation and curettage of uterus    . Cardiac catheterization N/A 08/14/2015    Procedure: Left Heart Cath and Coronary Angiography;  Surgeon: Peter M Martinique, MD;  Location: [facility];  Service: Cardiovascular;  Laterality: N/A;  . Cholecystectomy N/A 08/15/2015    Procedure: LAPAROSCOPIC CHOLECYSTECTOMY WITH ATTEMPTED INTRAOPERATIVE CHOLANGIOGRAM;  Surgeon: Erroll Luna, MD;  Location: [facility];  Service: General;  Laterality: N/A;    Family History  Problem Relation Age of Onset  . Diabetes Mother   . Hypertension Mother   . Diabetes Father   . Hypertension Father     Social History:  reports that she quit smoking  about 14 months ago. Her smoking use included Cigarettes. She has a 20 pack-year smoking history. She uses smokeless tobacco. She reports that she uses illicit drugs. She reports that she does not drink alcohol.  Allergies:  Allergies  Allergen Reactions  . Gabapentin     Skin rash, lip swelling  . Nsaids     IBU - Rectal bleeds; states ASA & Tylenol OK  . Tramadol Hcl     "Just doesn't do anything for me"    Medications:  Scheduled Meds: . diatrizoate meglumine-sodium      . iopamidol       Continuous Infusions:  PRN Meds:.   Results for orders placed or performed during the hospital encounter of 08/17/15 (from the past 48 hour(s))  CBC with Differential     Status: Abnormal   Collection Time: 08/17/15  7:25 AM  Result Value Ref Range   WBC 12.5 (H) 4.0 - 10.5 K/uL   RBC 4.29 3.87 - 5.11 MIL/uL   Hemoglobin 12.7 12.0 - 15.0 g/dL   HCT 38.8 36.0 - 46.0 %   MCV 90.4 78.0 - 100.0 fL   MCH 29.6 26.0 - 34.0 pg   MCHC 32.7 30.0 - 36.0 g/dL   RDW 13.7 11.5 - 15.5 %   Platelets 208 150 - 400 K/uL   Neutrophils Relative % 86 %   Neutro Abs  10.8 (H) 1.7 - 7.7 K/uL   Lymphocytes Relative 4 %   Lymphs Abs 0.5 (L) 0.7 - 4.0 K/uL   Monocytes Relative 9 %   Monocytes Absolute 1.1 (H) 0.1 - 1.0 K/uL   Eosinophils Relative 1 %   Eosinophils Absolute 0.1 0.0 - 0.7 K/uL   Basophils Relative 0 %   Basophils Absolute 0.0 0.0 - 0.1 K/uL  Comprehensive metabolic panel     Status: Abnormal   Collection Time: 08/17/15  7:25 AM  Result Value Ref Range   Sodium 138 135 - 145 mmol/L   Potassium 3.8 3.5 - 5.1 mmol/L   Chloride 103 101 - 111 mmol/L   CO2 24 22 - 32 mmol/L   Glucose, Bld 175 (H) 65 - 99 mg/dL   BUN 6 6 - 20 mg/dL   Creatinine, Ser 0.63 0.44 - 1.00 mg/dL   Calcium 9.1 8.9 - 10.3 mg/dL   Total Protein 6.5 6.5 - 8.1 g/dL   Albumin 3.5 3.5 - 5.0 g/dL   AST 287 (H) 15 - 41 U/L   ALT 241 (H) 14 - 54 U/L   Alkaline Phosphatase 270 (H) 38 - 126 U/L   Total Bilirubin 2.9 (H)  0.3 - 1.2 mg/dL   GFR calc non Af Amer >60 >60 mL/min   GFR calc Af Amer >60 >60 mL/min    Comment: (NOTE) The eGFR has been calculated using the CKD EPI equation. This calculation has not been validated in all clinical situations. eGFR's persistently <60 mL/min signify possible Chronic Kidney Disease.    Anion gap 11 5 - 15  Lipase, blood     Status: None   Collection Time: 08/17/15  7:25 AM  Result Value Ref Range   Lipase 20 11 - 51 U/L  I-Stat CG4 Lactic Acid, ED     Status: None   Collection Time: 08/17/15  7:40 AM  Result Value Ref Range   Lactic Acid, Venous 1.24 0.5 - 2.0 mmol/L  Urinalysis, Routine w reflex microscopic (not at [facility])     Status: Abnormal   Collection Time: 08/17/15  8:00 AM  Result Value Ref Range   Color, Urine YELLOW YELLOW   APPearance CLEAR CLEAR   Specific Gravity, Urine 1.011 1.005 - 1.030   pH 7.0 5.0 - 8.0   Glucose, UA NEGATIVE NEGATIVE mg/dL   Hgb urine dipstick NEGATIVE NEGATIVE   Bilirubin Urine SMALL (A) NEGATIVE   Ketones, ur NEGATIVE NEGATIVE mg/dL   Protein, ur NEGATIVE NEGATIVE mg/dL   Nitrite NEGATIVE NEGATIVE   Leukocytes, UA NEGATIVE NEGATIVE    Comment: MICROSCOPIC NOT DONE ON URINES WITH NEGATIVE PROTEIN, BLOOD, LEUKOCYTES, NITRITE, OR GLUCOSE <1000 mg/dL.  I-Stat CG4 Lactic Acid, ED     Status: None   Collection Time: 08/17/15 12:08 PM  Result Value Ref Range   Lactic Acid, Venous 1.16 0.5 - 2.0 mmol/L    Dg Chest 2 View  08/17/2015  CLINICAL DATA:  Two days status post laparoscopic cholecystectomy. Current fever and vomiting EXAM: CHEST  2 VIEW COMPARISON:  August 12, 2015 FINDINGS: There is increased atelectasis in the lingula with questionable earliest changes of pneumonia in this area. Lungs elsewhere clear. Heart size and pulmonary vascularity are normal. No adenopathy. There is degenerative change in the thoracic spine and in each shoulder. IMPRESSION: Lingular atelectasis with questionable earliest changes of pneumonia in this  area. Lungs elsewhere clear. Cardiac silhouette within normal limits. Electronically Signed   By: Lowella Grip III M.D.  On: 08/17/2015 07:46   US Abdomen Limited Ruq  08/17/2015  CLINICAL DATA:  Pain following recent cholecystectomy EXAM: US ABDOMEN LIMITED - RIGHT UPPER QUADRANT COMPARISON:  August 12, 2015 FINDINGS: Gallbladder: Surgically absent. No lesion is seen by ultrasound in the gallbladder fossa region. Common bile duct: Diameter: 5 mm. No intrahepatic or extrahepatic biliary duct dilatation. Liver: There is an echogenic focus in the left lobe of the liver measuring 1.8 x 0.9 x 1.5 cm. No other focal liver lesion evident. Within normal limits in parenchymal echogenicity. IMPRESSION: Echogenic focus in the left lobe of the liver, likely a hemangioma. Gallbladder absent. Known lesion is seen by ultrasound in the gallbladder fossa. Note that there is gas from bowel in this area. No biliary duct dilatation evident. Electronically Signed   By: Lowella Grip III M.D.   On: 08/17/2015 09:52    Review of Systems  Constitutional: Positive for fever, chills and diaphoresis.  Eyes: Negative for blurred vision, double vision, photophobia, pain, discharge and redness.  Respiratory: Negative for cough, hemoptysis, sputum production, shortness of breath and wheezing.   Cardiovascular: Negative for chest pain, palpitations, orthopnea, claudication, leg swelling and PND.  Gastrointestinal: Positive for nausea, vomiting, abdominal pain and constipation. Negative for heartburn, diarrhea, blood in stool and melena.  Genitourinary: Positive for dysuria, urgency and frequency. Negative for hematuria and flank pain.  Neurological: Negative for dizziness, tingling, tremors, sensory change, speech change, focal weakness, seizures and loss of consciousness.   Blood pressure 146/82, pulse 101, temperature 98.1 F (36.7 C), resp. rate 20, SpO2 96 %. Physical Exam  Constitutional: She is oriented to person,  place, and time. She appears well-developed and well-nourished. No distress.  Cardiovascular: Normal rate, regular rhythm, normal heart sounds and intact distal pulses.  Exam reveals no gallop and no friction rub.   No murmur heard. Respiratory: Effort normal and breath sounds normal. No respiratory distress. She has no wheezes. She has no rales. She exhibits no tenderness.  GI: Soft. Bowel sounds are normal. She exhibits no distension and no mass. There is no rebound and no guarding.  ttp around the incision, no evidence of peritonitis.  Musculoskeletal: Normal range of motion. She exhibits no edema or tenderness.  Neurological: She is alert and oriented to person, place, and time.  Skin: Skin is warm and dry. No rash noted. She is not diaphoretic. No erythema. No pallor.  Psychiatric: She has a normal mood and affect. Her behavior is normal. Thought content normal.    Assessment/Plan: Vomiting Abnormal LFTs Fever  Cough/SOB  Will proceed with CT of abdomen and pelvis plus a HIDA scan to rule out a leak and an abscess. Further management per EDP.  Will follow up on results.   Erby Pian  ANP-BC Pager [phone](  08/17/2015, 12:43 PM

## 2015-08-17 NOTE — H&P (Signed)
Date: 08/17/2015               Patient Name:  Kendra Adams MRN: [id number]  DOB: Feb 06, 1961 Age / Sex: 55 y.o., female   PCP: Loleta Chance, MD         Medical Service: Internal Medicine Teaching Service         Attending Physician: Dr. Gilles Chiquito, MD    First Contact: Dr. Zada Finders Pager: [phone]  Second Contact: Dr. Charlott Rakes Pager: [phone]       After Hours (After 5p/  First Contact Pager: [phone]  weekends / holidays): Second Contact Pager: [phone]   Chief Complaint: Urinary frequency, fever  History of Present Illness: [name] is a 55 year old lady with PMH of CAD, MI, HTN, HLD, Hypothyroidism, GERD, and T2DM who presents on post-op day #2 s/p laparscopic cholecystectomy for acute on chronic calculous cholecystitis on 08/15/15. Patient was admitted from 6/4-6/8 for her cholecystitis. Surgery was postoned until clearance by cardiology due to her complaint of anginal type pain similar to her prior MI. Left Heart Cath on 08/14/15 showed CAD with a previously placed patent stent to the right coronary artery and 10% stenosis ostial branch of the circumflex and she was cleared by cardiology on the same day.   After cardiac clearance, she underwent laparscopic cholecystectomy on 5/0/35 without complication. She was able to advance her diet overnight and felt well prior to discharge on 08/16/15. Surgical pathology was without evidence for malignancy. Hep B core antibody, surface antigen, and surface antibody were negative. She had slightly elevated white count (14k), AST/ALT (55/56), felt related to post-op changes as well as improved alk phos (156) from admission.  After discharge patient felt well during the day, ambulating well, and tolerating small portions of oral intake. Around 3 am this morning she was woken from sleep with increased urinary urgency and frequency. She says her urine was initially orange colored, then yellowish. She did not have dysuria or hematuria. She did not  have loss of bowel or bladder control, but did wet herself when she could not make it to the bathroom in time. She reports a fever of 101.4 F, chills, and one episode of watery emesis. She has not had a bowel movement since Tuesday, but reports frequent flatulence. She also reports a "catching pain at her right abdomen under her ribcage when she takes deep breaths. She denies any chest pain, palpitations, difficulty breathing, diaphoresis, leg swelling or pain in her calves. She has taken Tylenol for her pain at home.  In the ED, she was mildly tachycardic, oxygenating well on room air, and afebrile. Lab work was notable for increased AST/ALT (287/241), Alk phos (241), and Tbili (2.9 from 0.4). White count 12.5. UA was negative. CXR was read as questionable early changes of pneumonia.   Social Hx: former smoker 0.5 PPD, quit 2016  Family Hx: DM, HTN in parents   Meds: Current Facility-Administered Medications  Medication Dose Route Frequency Provider Last Rate Last Dose  . diatrizoate meglumine-sodium (GASTROGRAFIN) 66-10 % solution           . [START ON 08/18/2015] insulin aspart (novoLOG) injection 0-9 Units  0-9 Units Subcutaneous TID WC Jones Bales, MD       Current Outpatient Prescriptions  Medication Sig Dispense Refill  . acetaminophen (TYLENOL) 325 MG tablet Take 650 mg by mouth every 4 (four) hours as needed for mild pain.    Marland Kitchen albuterol (PROVENTIL HFA;VENTOLIN HFA) 108 (  90 Base) MCG/ACT inhaler Inhale 2 puffs into the lungs every 4 (four) hours as needed for wheezing or shortness of breath. Dispense with aerochamber 1 Inhaler 0  . aspirin 81 MG chewable tablet Chew 81 mg by mouth daily.    Marland Kitchen atorvastatin (LIPITOR) 80 MG tablet Take 1 tablet (80 mg total) by mouth daily. 90 tablet 3  . Blood Glucose Monitoring Suppl DEVI 1 Device by Does not apply route daily before breakfast. 1 each 0  . glipiZIDE (GLUCOTROL) 5 MG tablet Take 0.5 tablets (2.5 mg total) by mouth daily. 90 tablet 4   . Glucose Blood (BLOOD GLUCOSE TEST STRIPS) STRP 1 strip by In Vitro route daily before breakfast. 100 each 0  . Insulin Pen Needle (BD PEN NEEDLE NANO U/F) 32G X 4 MM MISC 1 Units/day by Does not apply route daily. 90 each 3  . ipratropium (ATROVENT) 0.06 % nasal spray Place 2 sprays into both nostrils 4 (four) times daily. 15 mL 0  . Lancets MISC 1 Units by Does not apply route daily. 50 each 0  . levothyroxine (LEVOTHROID) 25 MCG tablet Take 0.5 tablets (12.5 mcg total) by mouth daily. In addition to the 11mg tablet (total 162.562m daily) 30 tablet 11  . levothyroxine (SYNTHROID, LEVOTHROID) 150 MCG tablet TAKE 1 TABLET BY MOUTH DAILY BEFORE BREAKFAST. 90 tablet 3  . loratadine (CLARITIN) 10 MG tablet Take 1 tablet (10 mg total) by mouth daily. 30 tablet 11  . losartan (COZAAR) 25 MG tablet Take 1 tablet (25 mg total) by mouth daily. 30 tablet 11  . metFORMIN (GLUCOPHAGE) 1000 MG tablet TAKE 1 TABLET (1,000 MG TOTAL) BY MOUTH 2 (TWO) TIMES DAILY. 60 tablet 11  . metoprolol succinate (TOPROL-XL) 100 MG 24 hr tablet Take 1 tablet (100 mg total) by mouth daily. Take with or immediately following a meal. 30 tablet 5  . nitroGLYCERIN (NITROSTAT) 0.4 MG SL tablet Place 1 tablet (0.4 mg total) under the tongue every 5 (five) minutes as needed for chest pain. 25 tablet 3  . Omega-3 Fatty Acids (FISH OIL) 1000 MG CAPS Take 1 capsule by mouth daily.    . Marland Kitchenmeprazole (PRILOSEC) 40 MG capsule TAKE 1 CAPSULE BY MOUTH DAILY. 90 capsule 1  . rOPINIRole (REQUIP) 1 MG tablet TAKE 1 TABLET (1 MG TOTAL) BY MOUTH AT BEDTIME. 30 tablet 4  . sertraline (ZOLOFT) 100 MG tablet Take 1.5 tablets (150 mg total) by mouth daily. 90 tablet 3  . Spacer/Aero-Holding Chambers (AEROCHAMBER PLUS) inhaler Use as instructed 1 each 2    Allergies: Allergies as of 08/17/2015 - Review Complete 08/17/2015  Allergen Reaction Noted  . Gabapentin  07/25/2014  . Nsaids  09/25/2011  . Tramadol hcl  02/22/2010   Past Medical  History  Diagnosis Date  . Allergic rhinitis   . Tobacco abuse   . Depression   . GERD (gastroesophageal reflux disease)   . Hyperlipidemia   . Hypothyroidism   . CAD (coronary artery disease)     a. 12/13/10: mRCA 99%, EF 65-70%;  s/p BMS to mRCA  . HTN (hypertension)   . DM2 (diabetes mellitus, type 2) ([facility]  . Hyperlipidemia    Past Surgical History  Procedure Laterality Date  . Coronary angioplasty with stent placement    . Carpal tunnel release      w/ bone spurs  . Dilation and curettage of uterus    . Cardiac catheterization N/A 08/14/2015    Procedure: Left Heart Cath and Coronary  Angiography;  Surgeon: Peter M Martinique, MD;  Location: [facility];  Service: Cardiovascular;  Laterality: N/A;  . Cholecystectomy N/A 08/15/2015    Procedure: LAPAROSCOPIC CHOLECYSTECTOMY WITH ATTEMPTED INTRAOPERATIVE CHOLANGIOGRAM;  Surgeon: Erroll Luna, MD;  Location: [facility];  Service: General;  Laterality: N/A;   Family History  Problem Relation Age of Onset  . Diabetes Mother   . Hypertension Mother   . Diabetes Father   . Hypertension Father    Social History   Social History  . Marital Status: Married    Spouse Name: N/A  . Number of Children: 0  . Years of Education: 10th grade   Occupational History  . Conservation officer, nature     self-employed  . 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 Smoking status: Former Smoker -- 0.50 packs/day for 40 years    Types: Cigarettes    Quit date: 05/19/2014  . Smokeless tobacco: Current User     Comment: Trying to cut back.  . Alcohol Use: No  . Drug Use: Yes     Comment: Marijuana Use  . Sexual Activity: Not on file   Other Topics Concern  . Not on file   Social History Narrative   Father died of suicide (gunshot) in 3. 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       Patient lives with her husband, her husband's niece Tammy  who is mentally retarded and her best friend and the friend's daughter and 6 dogs                Review of Systems: Review of Systems  Constitutional: Positive for fever and chills. Negative for malaise/fatigue and diaphoresis.  Respiratory: Positive for cough. Negative for sputum production and shortness of breath.   Cardiovascular: Negative for chest pain, palpitations and leg swelling.  Gastrointestinal: Positive for vomiting, abdominal pain and constipation. Negative for nausea, diarrhea, blood in stool and melena.  Genitourinary: Positive for urgency and frequency. Negative for dysuria, hematuria and flank pain.  Musculoskeletal: Negative for myalgias.  Neurological: Positive for headaches. Negative for dizziness, focal weakness and weakness.     Physical Exam: Blood pressure 117/75, pulse 104, temperature 98.1 F (36.7 C), resp. rate 14, SpO2 91 %. Physical Exam  Constitutional: She is oriented to person, place, and time. She appears well-developed and well-nourished. No distress.  Obese woman, resting in bed, pleasant  HENT:  Head: Normocephalic and atraumatic.  Mouth/Throat: Oropharynx is clear and moist.  Eyes: EOM are normal. Pupils are equal, round, and reactive to light.  Neck: Neck supple.  Cardiovascular: Regular rhythm and intact distal pulses.   No murmur heard. Slight tachycardia  Pulmonary/Chest: Effort normal. No respiratory distress. She has no wheezes.  Minimal bibasilar crackles   Abdominal: Soft. Bowel sounds are normal. She exhibits no distension. There is no guarding.  Minimal tenderness RUQ. Bruising left lower abdomen. 4 incision sites are clean dry and without obvious sign of infection.  Musculoskeletal: Normal range of motion. She exhibits no edema or tenderness.  Neurological: She is alert and oriented to person, place, and time.  Skin: Skin is warm. She is not diaphoretic.  Psychiatric: She has a normal mood and affect.     Lab  results: Basic Metabolic Panel:  Recent Labs  08/16/15 0300 08/17/15 0725  NA 140 138  K 3.8 3.8  CL 107 103  CO2 27 24  GLUCOSE 162* 175*  BUN <5* 6  CREATININE 0.58 0.63  CALCIUM 9.0 9.1   Liver Function Tests:  Recent Labs  08/16/15 0300 08/17/15 0725  AST 55* 287*  ALT 56* 241*  ALKPHOS 156* 270*  BILITOT 0.4 2.9*  PROT 6.1* 6.5  ALBUMIN 3.3* 3.5    Recent Labs  08/17/15 0725  LIPASE 20   No results for input(s): AMMONIA in the last 72 hours. CBC:  Recent Labs  08/16/15 0300 08/17/15 0725  WBC 14.2* 12.5*  NEUTROABS  --  10.8*  HGB 12.8 12.7  HCT 39.1 38.8  MCV 90.1 90.4  PLT 222 208   Cardiac Enzymes: No results for input(s): CKTOTAL, CKMB, CKMBINDEX, TROPONINI in the last 72 hours. BNP: No results for input(s): PROBNP in the last 72 hours. D-Dimer: No results for input(s): DDIMER in the last 72 hours. CBG:  Recent Labs  08/15/15 1153 08/15/15 1558 08/15/15 2107 08/16/15 0410 08/16/15 0624 08/16/15 1143  GLUCAP 203* 216* 188* 143* 215* 137*   Hemoglobin A1C: No results for input(s): HGBA1C in the last 72 hours. Fasting Lipid Panel: No results for input(s): CHOL, HDL, LDLCALC, TRIG, CHOLHDL, LDLDIRECT in the last 72 hours. Thyroid Function Tests: No results for input(s): TSH, T4TOTAL, FREET4, T3FREE, THYROIDAB in the last 72 hours. Anemia Panel: No results for input(s): VITAMINB12, FOLATE, FERRITIN, TIBC, IRON, RETICCTPCT in the last 72 hours. Coagulation: No results for input(s): LABPROT, INR in the last 72 hours. Urine Drug Screen: Drugs of Abuse     Component Value Date/Time   LABOPIA NONE DETECTED 02/10/2007 2022   COCAINSCRNUR POSITIVE* 02/10/2007 2022   LABBENZ NONE DETECTED 02/10/2007 2022   AMPHETMU NONE DETECTED 02/10/2007 2022   THCU POSITIVE* 02/10/2007 2022   LABBARB  02/10/2007 2022    NONE DETECTED        DRUG SCREEN FOR MEDICAL PURPOSES ONLY.  IF CONFIRMATION IS NEEDED FOR ANY PURPOSE, NOTIFY LAB WITHIN 5  DAYS.    Alcohol Level: No results for input(s): ETH in the last 72 hours. Urinalysis:  Recent Labs  08/17/15 0800  COLORURINE YELLOW  LABSPEC 1.011  PHURINE 7.0  GLUCOSEU NEGATIVE  HGBUR NEGATIVE  BILIRUBINUR SMALL*  KETONESUR NEGATIVE  PROTEINUR NEGATIVE  NITRITE NEGATIVE  LEUKOCYTESUR NEGATIVE     Imaging results:  Dg Chest 2 View  08/17/2015  CLINICAL DATA:  Two days status post laparoscopic cholecystectomy. Current fever and vomiting EXAM: CHEST  2 VIEW COMPARISON:  August 12, 2015 FINDINGS: There is increased atelectasis in the lingula with questionable earliest changes of pneumonia in this area. Lungs elsewhere clear. Heart size and pulmonary vascularity are normal. No adenopathy. There is degenerative change in the thoracic spine and in each shoulder. IMPRESSION: Lingular atelectasis with questionable earliest changes of pneumonia in this area. Lungs elsewhere clear. Cardiac silhouette within normal limits. Electronically Signed   By: Lowella Grip III M.D.   On: 08/17/2015 07:46   Nm Hepatobiliary Including Gb  08/17/2015  CLINICAL DATA:  Status post laparoscopic cholecystectomy 08/15/2015. Evaluate for possible bile leak. Abdominal pain, emesis and fever. Elevated liver function studies. EXAM: NUCLEAR MEDICINE HEPATOBILIARY IMAGING TECHNIQUE: Sequential images of the abdomen were obtained out to 60 minutes following intravenous administration of radiopharmaceutical. RADIOPHARMACEUTICALS:  5.3 mCi Tc-2m Choletec IV COMPARISON:  CT scan 08/17/2015 FINDINGS: Homogeneous uptake in the liver. No obvious biliary activity or small bowel activity during the first hour of scanning. During  the second hour the liver retains a significant amount of the radiopharmaceutical. However there is some activity noted in the common bile duct and probable duodenum. No leak is identified. IMPRESSION: 1. Persistent hepatic activity most likely due to hepatic dysfunction. Elevated liver function studies  and elevated bilirubin are noted. 2. No findings suspicious for a bile leak. Electronically Signed   By: Marijo Sanes M.D.   On: 08/17/2015 16:31   Ct Abdomen Pelvis W Contrast  08/17/2015  CLINICAL DATA:  55 year old female with a history of prior laparoscopic cholecystectomy 2 days ago EXAM: CT ABDOMEN AND PELVIS WITH CONTRAST TECHNIQUE: Multidetector CT imaging of the abdomen and pelvis was performed using the standard protocol following bolus administration of intravenous contrast. CONTRAST:  1 ISOVUE-300 IOPAMIDOL (ISOVUE-300) INJECTION 61% COMPARISON:  No prior CT. Ultrasound 08/17/2015 FINDINGS: Lower chest: Unremarkable appearance of the soft tissues of the chest wall. Heart size within normal limits.  No pericardial fluid/thickening. Calcifications in the distribution of the left anterior descending, circumflex, right coronary arteries. No lower mediastinal adenopathy. Unremarkable appearance of the distal esophagus. Small hiatal hernia. No confluent airspace disease, pleural fluid, or pneumothorax within visualized lung. Abdomen/pelvis: Cranial caudal span of the liver measures 20 cm. Unremarkable spleen. Unremarkable appearance of bilateral adrenal glands. Cholecystectomy. Minimal inflammatory changes in the region of the gallbladder surgical bed. No focal fluid collection. Unremarkable appearance of the pancreas. No intrahepatic or extrahepatic biliary ductal dilatation. No intra-peritoneal free air or significant free-fluid. No abnormally dilated small bowel or colon. No transition point. No inflammatory changes of the mesenteries. "Hammock" configuration of the course of the duodenum. Enteric contrast filled duodenum diverticulum. Normal appendix identified. Colonic diverticula without associated inflammatory changes. Right Kidney/Ureter: No hydronephrosis. No nephrolithiasis. No perinephric stranding. Unremarkable course of the right ureter. Left Kidney/Ureter: No hydronephrosis. No  nephrolithiasis. No perinephric stranding. Unremarkable course of the left ureter. Unremarkable appearance of the urinary bladder. Unremarkable appearance of the uterus. Tubular fluid-filled configuration of the left adnexa, compatible with hydrosalpinx. Unremarkable right adnexa. No inflammatory changes associated with the left adnexa. Scattered vascular calcifications. No aneurysm. No periaortic fluid. Musculoskeletal: No displaced fracture. Surgical changes of prior percutaneous port placement in the right upper quadrant and at the emboli kiss, status post laparoscopic cholecystectomy. Multilevel degenerative changes of the spine. Nonspecific pelvic floor laxity. IMPRESSION: Early surgical changes of recent laparoscopic cholecystectomy, without focal fluid collection or evidence of abscess. Left-sided hydrosalpinx. Sterility not assessed by CT, however, there are no adjacent inflammatory changes. Referral for Ob GYN evaluation may be useful. Diverticular disease without evidence of acute diverticulitis. Atherosclerosis and coronary artery disease. Signed, Dulcy Fanny. Earleen Newport, DO [facility] Electronically Signed   By: Corrie Mckusick D.O.   On: 08/17/2015 13:40   US Abdomen Limited Ruq  08/17/2015  CLINICAL DATA:  Pain following recent cholecystectomy EXAM: US ABDOMEN LIMITED - RIGHT UPPER QUADRANT COMPARISON:  August 12, 2015 FINDINGS: Gallbladder: Surgically absent. No lesion is seen by ultrasound in the gallbladder fossa region. Common bile duct: Diameter: 5 mm. No intrahepatic or extrahepatic biliary duct dilatation. Liver: There is an echogenic focus in the left lobe of the liver measuring 1.8 x 0.9 x 1.5 cm. No other focal liver lesion evident. Within normal limits in parenchymal echogenicity. IMPRESSION: Echogenic focus in the left lobe of the liver, likely a hemangioma. Gallbladder absent. Known lesion is seen by ultrasound in the gallbladder fossa. Note  that there is gas from bowel in this area. No biliary  duct dilatation evident. Electronically Signed   By: Lowella Grip III M.D.   On: 08/17/2015 09:52    Other results: EKG: Sinus tachycardia, PVC, normal r-wave progression.  Assessment & Plan by Problem: Principal Problem:   Elevated transaminase level Active Problems:   Hypothyroidism   HLD (hyperlipidemia)   Hypertension associated with diabetes ([place])   Status post laparoscopic cholecystectomy  55 year old lady with PMH of CAD, MI, HTN, HLD, Hypothyroidism, GERD, and T2DM who presents on post-op day #2 s/p laparscopic cholecystectomy for acute on chronic calculous cholecystitis with fever at home, urinary frequency, and elevated transaminases.   Elevated transaminases s/p lap chole: Patient with mild RUQ pain worsened with deep inspiration. Possibly related to post-op leak or abscess or retained stone in bile duct. Abdominal U/S showed likely hemangioma in left lobe of liver with no biliary ductal dilation. Surgery was consulted and ordered a CT abd/pelvis and HIDA scan. CT abd/pelvis did not show evidence of focal fluid collection or abscess. HIDA scan without evidence for a bile leak, shows persistent hepatic activity suggestive of hepatic dysfunction. Hep B core antibody, surface antigen, and surface antibody were negative on recent admission. HCV antibody was negative in December 2016. Would need to think about ischemia if no improvement. Suspicion for PE is low, but would consider work up if patient becomes hypoxic.  -Surgery following, appreciate recs -May need MRCP -Incentive spirometry every hour when awake -Zofran prn -Morphine 2 mg q4h only as needed for severe pain -Hold statin -Hold Tylenol   Urinary frequency: She has one day history of urinary frequency.. Would hold off on antibiotics as UA was negative for UTI. A left sided hydrosalpinx was noted on CT. -Consider OB/GYN referral for hydrosalpinx  Hypothyroidism:  TSH 10.530 on 08/12/2015, elevated from 1.5 back in March 2017. Suspect there may be non-adherence as usual dosing is 1 microgram per kilogram. -Continue Synthroid 172.5 g for now though will have to assess adherence  Hyperlipidemia: Hold Lipitor 80 mg daily in setting of transaminitis  Coronary artery disease s/p PCI [BMS in 12/2010]:  -Continue aspirin 32m -prn sublingual nitro  Non-insulin-dependent type 2 diabetes: A1c 7.6, December 2016.  -Continue SSI-S  Depression: Continue sertraline 150 mg daily.  Hypertension: Blood pressures trending 110s-150s/70s-80s. -Continue losartan 25 mg daily -Continue metoprolol 100 mg daily  Diet: NPO at midnight  DVT prophylaxis: Lovenox  CODE STATUS: FULL CODE   Dispo: Disposition is deferred at this time, awaiting improvement of current medical problems. Anticipated discharge in approximately 1-2 day(s).   The patient does have a current PCP (Loleta Chance MD) and [facility].  The patient does not have transportation limitations that hinder transportation to clinic appointments.  Signed: VZada Finders MD 08/17/2015, 7:00 PM

## 2015-08-18 LAB — COMPREHENSIVE METABOLIC PANEL
ALT: 175 U/L — ABNORMAL HIGH (ref 14–54)
AST: 124 U/L — ABNORMAL HIGH (ref 15–41)
Albumin: 3 g/dL — ABNORMAL LOW (ref 3.5–5.0)
Alkaline Phosphatase: 284 U/L — ABNORMAL HIGH (ref 38–126)
Anion gap: 8 (ref 5–15)
BUN: <5 mg/dL — ABNORMAL LOW (ref 6–20)
CO2: 27 mmol/L (ref 22–32)
Calcium: 9.2 mg/dL (ref 8.9–10.3)
Chloride: 102 mmol/L (ref 101–111)
Creatinine, Ser: 0.66 mg/dL (ref 0.44–1.00)
GFR calc Af Amer: >60 mL/min (ref >60)
GFR calc non Af Amer: >60 mL/min (ref >60)
Glucose, Bld: 146 mg/dL — ABNORMAL HIGH (ref 65–99)
Potassium: 3.3 mmol/L — ABNORMAL LOW (ref 3.5–5.1)
Sodium: 137 mmol/L (ref 135–145)
Total Bilirubin: 4.2 mg/dL — ABNORMAL HIGH (ref 0.3–1.2)
Total Protein: 6.4 g/dL — ABNORMAL LOW (ref 6.5–8.1)

## 2015-08-18 LAB — GLUCOSE, CAPILLARY
Glucose-Capillary: 130 mg/dL — ABNORMAL HIGH (ref 65–99)
Glucose-Capillary: 143 mg/dL — ABNORMAL HIGH (ref 65–99)
Glucose-Capillary: 193 mg/dL — ABNORMAL HIGH (ref 65–99)
Glucose-Capillary: 145 mg/dL — ABNORMAL HIGH (ref 65–99)

## 2015-08-18 LAB — CBC
HCT: 38.4 % (ref 36.0–46.0)
Hemoglobin: 12.3 g/dL (ref 12.0–15.0)
MCH: 29.1 pg (ref 26.0–34.0)
MCHC: 32 g/dL (ref 30.0–36.0)
MCV: 90.8 fL (ref 78.0–100.0)
Platelets: 220 10*3/uL (ref 150–400)
RBC: 4.23 MIL/uL (ref 3.87–5.11)
RDW: 14 % (ref 11.5–15.5)
WBC: 9.6 10*3/uL (ref 4.0–10.5)

## 2015-08-18 LAB — PROTIME-INR
INR: 1.16 (ref 0.00–1.49)
Prothrombin Time: 14.9 seconds (ref 11.6–15.2)

## 2015-08-18 MED ORDER — POTASSIUM CHLORIDE CRYS ER 20 MEQ PO TBCR
40.0000 meq | EXTENDED_RELEASE_TABLET | Freq: Once | ORAL | Status: AC
  Administered 2015-08-18: 40 meq via ORAL
  Filled 2015-08-18: qty 2

## 2015-08-18 MED ORDER — PRO-STAT SUGAR FREE PO LIQD
30.0000 mL | Freq: Two times a day (BID) | ORAL | Status: DC
  Administered 2015-08-18 – 2015-08-21 (×2): 30 mL via ORAL
  Filled 2015-08-18 (×6): qty 30

## 2015-08-18 MED ORDER — SENNOSIDES-DOCUSATE SODIUM 8.6-50 MG PO TABS
1.0000 | ORAL_TABLET | Freq: Every day | ORAL | Status: DC
  Administered 2015-08-19 – 2015-08-20 (×2): 1 via ORAL
  Filled 2015-08-18 (×4): qty 1

## 2015-08-18 MED ORDER — POTASSIUM CHLORIDE 10 MEQ/100ML IV SOLN
10.0000 meq | INTRAVENOUS | Status: DC
  Filled 2015-08-18 (×4): qty 100

## 2015-08-18 MED ORDER — OXYCODONE HCL 5 MG PO TABS
5.0000 mg | ORAL_TABLET | Freq: Four times a day (QID) | ORAL | Status: DC | PRN
  Administered 2015-08-18 – 2015-08-21 (×5): 5 mg via ORAL
  Filled 2015-08-18 (×5): qty 1

## 2015-08-18 NOTE — Progress Notes (Signed)
New Admission Note:   Arrival Method: Via stretcher from ED Mental Orientation:  A & O x 4 Telemetry: N/A Assessment: Completed Skin: Healing surgical incisions from Lap Choly on 08/15/2015 IV: Rt Hand Pain: None Tubes: None Safety Measures: Safety Fall Prevention Plan has been given, discussed and signed Admission: Completed [address] Orientation: Patient has been orientated to the room, unit and staff.  Family:  Husband at bedside  Orders have been reviewed and implemented. Will continue to monitor the patient. Call light has been placed within reach and bed alarm has been activated.   Earleen Reaper RN- [place], [place] Phone number: [phone]

## 2015-08-18 NOTE — Progress Notes (Signed)
 Subjective: Patient feels well today other than continued catching pain at her RUQ with deep inspiration. Tmax was 101.4 last night, however she has not felt subjective fevers since admission. She was able to walk from the bathroom to bed when seen this morning. She was given an incentive spirometry and instructed how to use this during rounds. She says she is "on a mission" to use it every hour or commercial break when watching TV.  Objective: Vital signs in last 24 hours: Filed Vitals:   08/18/15 0017 08/18/15 0200 08/18/15 0440 08/18/15 0925  BP: 130/68  111/52 115/76  Pulse: 98  82 83  Temp: 98.9 F (37.2 C)  98.4 F (36.9 C) 98.4 F (36.9 C)  TempSrc: Oral  Oral Oral  Resp: 18  16 18  Height:  5' 2" (1.575 m)    Weight:      SpO2: 92%  94% 94%   Weight change:   Intake/Output Summary (Last 24 hours) at 08/18/15 1236 Last data filed at 08/18/15 0915  Gross per 24 hour  Intake    360 ml  Output      0 ml  Net    360 ml   General: sitting up in bed, no acute distress, pleasant HEENT: no scleral icterus Cardiac: RRR, no rubs, murmurs or gallops Pulm: distant breath sounds, otherwise clear to auscultation bilaterally Abd: soft, nontender, nondistended, healing ecchymoses across lower left abdomen, surgical incisions clean and intact Skin: Not jaundiced Neuro: alert and oriented X3, cranial nerves II-XII grossly intact   Assessment/Plan: Principal Problem:   Elevated transaminase level Active Problems:   Hypothyroidism   HLD (hyperlipidemia)   Hypertension associated with diabetes (HCC)   Status post laparoscopic cholecystectomy   Post-op fever and elevated LFTs s/p lap chole (08/15/15): Post-op day #3. AST/ALT are trending down from admission, however alk phos and Tbili are rising. Tmax was 101.4 last night. This may all be related to post-surgical changes or may be due to retained stone. CXR shows likely lingular atelectasis. Abdominal U/S was without biliary duct  dilatation; CT abd/pelvis was without focal fluid collection or abscess; and HIDA scan was without bile leak. Patient feels fairly well today, with benign abdominal exam, and no subjective fevers. Low suspicion for cholangitis. Will hold off on antibiotics for now, obtain blood cultures and monitor fever curve. Surgery has advanced diet to clears. -f/u blood cultures -Monitor CMET -Continue Incentive Spirometry every hour while awake -Consider starting antibiotics such as Zosyn if repeat fever and repeating CXR in AM -May need MRCP if worsening or no improvement -Clear diet -Oxy IR mg po q6h moderate pain  Urinary frequency: She has one day history of urinary frequency and urgency. Would hold off on antibiotics as UA was negative for UTI. A left sided hydrosalpinx was noted on CT. -Consider repeat UA if symptoms persist  Hypertension: Blood pressures stable. -Continue losartan 25 mg daily -Continue metoprolol 100 mg daily  Hypothyroidism: TSH 10.530 on 08/12/2015, elevated from 1.5 back in March 2017. Suspect there may be non-adherence as usual dosing is 1 microgram per kilogram. -Continue Synthroid 172.5 g for now though will have to assess adherence  Hyperlipidemia: Hold Lipitor 80 mg daily in setting of transaminitis  Coronary artery disease s/p PCI [BMS in 12/2010]:  -Continue aspirin 81mg -prn sublingual nitro  Non-insulin-dependent type 2 diabetes: A1c 7.6, December 2016.  -Continue SSI-S  Depression: Continue sertraline 150 mg daily.    Dispo: Disposition is deferred at this time, awaiting   improvement of current medical problems.  Anticipated discharge in approximately 1-2 day(s).   The patient does have a current PCP (Kyle Flores, MD) and does need an [facility] hospital follow-up appointment after discharge.  The patient does not have transportation limitations that hinder transportation to clinic appointments.    LOS: 1 day    , MD 08/18/2015, 12:36 PM  

## 2015-08-18 NOTE — Progress Notes (Signed)
Nutrition Brief Note  Patient identified on the Malnutrition Screening Tool (MST) Report  Wt Readings from Last 15 Encounters:  08/17/15 255 lb 4.7 oz (115.8 kg)  08/16/15 247 lb 6.4 oz (112.22 kg)  07/05/15 256 lb 14.4 oz (116.529 kg)  05/16/15 258 lb 1.6 oz (117.073 kg)  02/26/15 261 lb 1.6 oz (118.434 kg)  02/13/15 259 lb (117.482 kg)  01/19/15 256 lb 8 oz (116.348 kg)  11/20/14 256 lb 14.4 oz (116.529 kg)  09/07/14 256 lb 1.6 oz (116.166 kg)  08/21/14 254 lb 12.8 oz (115.577 kg)  08/03/14 254 lb 3.2 oz (115.304 kg)  07/25/14 256 lb 8 oz (116.348 kg)  07/17/14 258 lb 1.6 oz (117.073 kg)  07/05/14 251 lb 12.8 oz (114.216 kg)  05/23/14 252 lb 1.6 oz (114.352 kg)    Body mass index is 46.68 kg/(m^2). Patient meets criteria for Morbidly obese based on current BMI.   Pt presented with Post-op fever and elevated LFTs s/p lap chole (08/15/15).   diet order  Was just advanced to CL. 30 mls Prostat BID  No nutrition interventions warranted at this time. If nutrition issues arise, please consult RD.   Burtis Junes RD, LDN, CNSC Clinical Nutrition Pager: [phone] 08/18/2015 4:43 PM

## 2015-08-18 NOTE — Progress Notes (Signed)
Patient to receive 1 unit novolog subcut for cbg 130. Dose held d/t NPO status. Dr. Posey Pronto notified. Will continue to monitor. Bartholomew Crews, RN

## 2015-08-18 NOTE — Discharge Summary (Signed)
Name: Kendra Adams MRN: [id number] DOB: [birth year] 55 y.o. PCP: Loleta Chance, MD  Date of Admission: 08/17/2015  6:33 AM Date of Discharge: 08/21/2015 Attending Physician: Murriel Hopper, MD  Discharge Diagnosis: 1. E coli bacteremia 2. Elevated LFTs s/p laparoscopic cholecystectomy  Principal Problem:   E coli bacteremia Active Problems:   Hypothyroidism   HLD (hyperlipidemia)   Hypertension associated with diabetes ([place])   CAD (coronary artery disease)   Status post laparoscopic cholecystectomy   Elevated transaminase level  Discharge Medications:   Medication List    STOP taking these medications        acetaminophen 325 MG tablet  Commonly known as:  TYLENOL      TAKE these medications        AEROCHAMBER PLUS inhaler  Use as instructed     albuterol 108 (90 Base) MCG/ACT inhaler  Commonly known as:  PROVENTIL HFA;VENTOLIN HFA  Inhale 2 puffs into the lungs every 4 (four) hours as needed for wheezing or shortness of breath. Dispense with aerochamber     aspirin 81 MG chewable tablet  Chew 81 mg by mouth daily.     atorvastatin 80 MG tablet  Commonly known as:  LIPITOR  Take 1 tablet (80 mg total) by mouth daily.     Blood Glucose Monitoring Suppl Devi  1 Device by Does not apply route daily before breakfast.     BLOOD GLUCOSE TEST STRIPS Strp  1 strip by In Vitro route daily before breakfast.     cephALEXin 500 MG capsule  Commonly known as:  KEFLEX  Take 1 capsule (500 mg total) by mouth every 12 (twelve) hours.  Start taking on:  08/22/2015     Fish Oil 1000 MG Caps  Take 1 capsule by mouth daily.     glipiZIDE 5 MG tablet  Commonly known as:  GLUCOTROL  Take 0.5 tablets (2.5 mg total) by mouth daily.     Insulin Pen Needle 32G X 4 MM Misc  Commonly known as:  BD PEN NEEDLE NANO U/F  1 Units/day by Does not apply route daily.     ipratropium 0.06 % nasal spray  Commonly known as:  ATROVENT  Place 2 sprays into both nostrils 4 (four) times  daily.     Lancets Misc  1 Units by Does not apply route daily.     levothyroxine 25 MCG tablet  Commonly known as:  LEVOTHROID  Take 0.5 tablets (12.5 mcg total) by mouth daily. In addition to the 168mg tablet (total 162.569m daily)     levothyroxine 150 MCG tablet  Commonly known as:  SYNTHROID, LEVOTHROID  TAKE 1 TABLET BY MOUTH DAILY BEFORE BREAKFAST.     loratadine 10 MG tablet  Commonly known as:  CLARITIN  Take 1 tablet (10 mg total) by mouth daily.     losartan 25 MG tablet  Commonly known as:  COZAAR  Take 1 tablet (25 mg total) by mouth daily.     metFORMIN 1000 MG tablet  Commonly known as:  GLUCOPHAGE  TAKE 1 TABLET (1,000 MG TOTAL) BY MOUTH 2 (TWO) TIMES DAILY.     metoprolol succinate 100 MG 24 hr tablet  Commonly known as:  TOPROL-XL  Take 1 tablet (100 mg total) by mouth daily. Take with or immediately following a meal.     nitroGLYCERIN 0.4 MG SL tablet  Commonly known as:  NITROSTAT  Place 1 tablet (0.4 mg total) under the tongue every 5 (five) minutes as  needed for chest pain.     omeprazole 40 MG capsule  Commonly known as:  PRILOSEC  TAKE 1 CAPSULE BY MOUTH DAILY.     oxyCODONE 5 MG immediate release tablet  Commonly known as:  Oxy IR/ROXICODONE  Take 1 tablet (5 mg total) by mouth at bedtime as needed for moderate pain.     rOPINIRole 1 MG tablet  Commonly known as:  REQUIP  TAKE 1 TABLET (1 MG TOTAL) BY MOUTH AT BEDTIME.     sertraline 100 MG tablet  Commonly known as:  ZOLOFT  Take 1.5 tablets (150 mg total) by mouth daily.        Disposition and follow-up:   Kendra Adams was discharged from [facility] in Stable condition.  At the hospital follow up visit please address:  1. Completion of antibiotics (Cephalexin 500 mg BID, last dose 08/28/15) , recurrence of symptoms or fevers, continues to tolerate diet.  2.  Labs / imaging needed at time of follow-up: LFT  3.  Pending labs/ test needing follow-up:  none  Follow-up Appointments: Follow-up Information    Follow up with [facility] On 08/29/2015.   Specialty:  General Surgery   Why:  arrive by 10:15AM for a 10:45AM post op check   Contact information:   [address] [place] [address] [phone]       Follow up with Dellia Nims, MD On 08/28/2015.   Specialty:  Internal Medicine   Why:  Arrive by 2:15 pm for your appointment at 2:45 pm.   Contact information:   [place] [address] [phone]       Discharge Instructions: Discharge Instructions    Call MD for:  persistant dizziness or light-headedness    Complete by:  As directed      Call MD for:  persistant nausea and vomiting    Complete by:  As directed      Call MD for:  redness, tenderness, or signs of infection (pain, swelling, redness, odor or green/yellow discharge around incision site)    Complete by:  As directed      Call MD for:  severe uncontrolled pain    Complete by:  As directed      Call MD for:  temperature >100.4    Complete by:  As directed      Diet - low sodium heart healthy    Complete by:  As directed      Increase activity slowly    Complete by:  As directed            Consultations:    Procedures Performed:  Dg Chest 2 View  08/17/2015  CLINICAL DATA:  Two days status post laparoscopic cholecystectomy. Current fever and vomiting EXAM: CHEST  2 VIEW COMPARISON:  August 12, 2015 FINDINGS: There is increased atelectasis in the lingula with questionable earliest changes of pneumonia in this area. Lungs elsewhere clear. Heart size and pulmonary vascularity are normal. No adenopathy. There is degenerative change in the thoracic spine and in each shoulder. IMPRESSION: Lingular atelectasis with questionable earliest changes of pneumonia in this area. Lungs elsewhere clear. Cardiac silhouette within normal limits. Electronically Signed   By: Lowella Grip III M.D.   On: 08/17/2015 07:46   Dg Chest 2  View  08/12/2015  CLINICAL DATA:  Acute onset of substernal chest pain and shortness of breath. Nausea and vomiting. Initial encounter. EXAM: CHEST  2 VIEW COMPARISON:  Chest radiograph performed 02/03/2015 FINDINGS: The lungs are well-aerated. Minimal bibasilar atelectasis is noted. Mild peribronchial thickening is seen. There is no evidence of pleural effusion or pneumothorax. The heart is normal in size; the mediastinal contour is within normal limits. No acute osseous abnormalities are seen. IMPRESSION: Minimal bibasilar atelectasis noted. Mild peribronchial thickening seen. Electronically Signed   By: Garald Balding M.D.   On: 08/12/2015 02:19   Nm Hepatobiliary Including Gb  08/17/2015  CLINICAL DATA:  Status post laparoscopic cholecystectomy 08/15/2015. Evaluate for possible bile leak. Abdominal pain, emesis and fever. Elevated liver function studies. EXAM: NUCLEAR MEDICINE HEPATOBILIARY IMAGING TECHNIQUE: Sequential images of the abdomen were obtained out to 60 minutes following intravenous administration of radiopharmaceutical. RADIOPHARMACEUTICALS:  5.3 mCi Tc-7m Choletec IV COMPARISON:  CT scan 08/17/2015 FINDINGS: Homogeneous uptake in the liver. No obvious biliary activity or small bowel activity during the first hour of scanning. During the second hour the liver retains a significant amount of the radiopharmaceutical. However there is some activity noted in the common bile duct and probable duodenum. No leak is identified. IMPRESSION: 1. Persistent hepatic activity most likely due to hepatic dysfunction. Elevated liver function studies and elevated bilirubin are noted. 2. No findings suspicious for a bile leak. Electronically Signed   By: PMarijo SanesM.D.   On: 08/17/2015 16:31   Nm Hepatobiliary Liver Func  08/12/2015  CLINICAL DATA:  Evaluate for acute cholecystitis. Intermittent right upper quadrant pain. EXAM: NUCLEAR MEDICINE HEPATOBILIARY IMAGING TECHNIQUE: Sequential images of the  abdomen were obtained out to 60 minutes following intravenous administration of radiopharmaceutical. RADIOPHARMACEUTICALS:  5.4  mCi Tc-958mCholetec IV COMPARISON:  Ultrasound 08/12/2015 FINDINGS: Prompt uptake and biliary excretion of activity by the liver is seen. Biliary activity passes into small bowel, consistent with patent common bile duct. After 30 minutes of imaging no gallbladder activity was identified. Subsequently the patient received 4.6 mg of morphine an additional 1 millicurie of Choletec. Imaging was carried out for additional 30 minutes without visualization of the gallbladder. IMPRESSION: 1. Nonvisualization of the gallbladder following morphine administration compatible with cystic duct obstruction and acute cholecystitis. These results will be called to the ordering clinician or representative by the Radiologist Assistant, and communication documented in the PACS or zVision Dashboard. Electronically Signed   By: TaKerby Moors.D.   On: 08/12/2015 15:00   UsKoreabdomen Complete  08/12/2015  CLINICAL DATA:  Epigastric and right upper quadrant pain for 1 week. EXAM: ABDOMEN ULTRASOUND COMPLETE COMPARISON:  None. FINDINGS: Gallbladder: Cholelithiasis without over distension or focal tenderness to suggest acute cholecystitis. Common bile duct: Diameter: 6 mm, upper limits of normal. Where visualized, no filling defect. Liver: 12 mm hyperechoic lesion in the upper left liver which is homogeneous appearing. No visible cirrhosis. IVC: Limited visualization is negative. Pancreas: Limited visualization. Either GDA or main duct is visualized at the head. If main duct, upper limits of normal at 3 mm. Spleen: Size and appearance within normal limits. Right Kidney: Length: 10 cm. Echogenicity within normal limits. No mass or hydronephrosis visualized. Left Kidney: Length: 11 cm. Echogenicity within normal limits. No mass or hydronephrosis visualized. Abdominal aorta: Only the proximal aorta could be seen  due to bowel gas. No evidence of aneurysm. IMPRESSION: 1. Cholelithiasis without acute cholecystitis. 2. 12 mm hyperechoic lesion in the left liver favoring hemangioma. Suggest follow-up ultrasound in 6 months. 3. Limited visualization due to bowel gas and body habitus. Electronically Signed   By: JoNeva Seat.  On: 08/12/2015 03:34   Ct Abdomen Pelvis W Contrast  08/17/2015  CLINICAL DATA:  54 year old female with a history of prior laparoscopic cholecystectomy 2 days ago EXAM: CT ABDOMEN AND PELVIS WITH CONTRAST TECHNIQUE: Multidetector CT imaging of the abdomen and pelvis was performed using the standard protocol following bolus administration of intravenous contrast. CONTRAST:  1 ISOVUE-300 IOPAMIDOL (ISOVUE-300) INJECTION 61% COMPARISON:  No prior CT. Ultrasound 08/17/2015 FINDINGS: Lower chest: Unremarkable appearance of the soft tissues of the chest wall. Heart size within normal limits.  No pericardial fluid/thickening. Calcifications in the distribution of the left anterior descending, circumflex, right coronary arteries. No lower mediastinal adenopathy. Unremarkable appearance of the distal esophagus. Small hiatal hernia. No confluent airspace disease, pleural fluid, or pneumothorax within visualized lung. Abdomen/pelvis: Cranial caudal span of the liver measures 20 cm. Unremarkable spleen. Unremarkable appearance of bilateral adrenal glands. Cholecystectomy. Minimal inflammatory changes in the region of the gallbladder surgical bed. No focal fluid collection. Unremarkable appearance of the pancreas. No intrahepatic or extrahepatic biliary ductal dilatation. No intra-peritoneal free air or significant free-fluid. No abnormally dilated small bowel or colon. No transition point. No inflammatory changes of the mesenteries. "Hammock" configuration of the course of the duodenum. Enteric contrast filled duodenum diverticulum. Normal appendix identified. Colonic diverticula without associated  inflammatory changes. Right Kidney/Ureter: No hydronephrosis. No nephrolithiasis. No perinephric stranding. Unremarkable course of the right ureter. Left Kidney/Ureter: No hydronephrosis. No nephrolithiasis. No perinephric stranding. Unremarkable course of the left ureter. Unremarkable appearance of the urinary bladder. Unremarkable appearance of the uterus. Tubular fluid-filled configuration of the left adnexa, compatible with hydrosalpinx. Unremarkable right adnexa. No inflammatory changes associated with the left adnexa. Scattered vascular calcifications. No aneurysm. No periaortic fluid. Musculoskeletal: No displaced fracture. Surgical changes of prior percutaneous port placement in the right upper quadrant and at the emboli kiss, status post laparoscopic cholecystectomy. Multilevel degenerative changes of the spine. Nonspecific pelvic floor laxity. IMPRESSION: Early surgical changes of recent laparoscopic cholecystectomy, without focal fluid collection or evidence of abscess. Left-sided hydrosalpinx. Sterility not assessed by CT, however, there are no adjacent inflammatory changes. Referral for Ob GYN evaluation may be useful. Diverticular disease without evidence of acute diverticulitis. Atherosclerosis and coronary artery disease. Signed, Dulcy Fanny. Earleen Newport, DO [facility] Electronically Signed   By: Corrie Mckusick D.O.   On: 08/17/2015 13:40   US Abdomen Limited Ruq  08/17/2015  CLINICAL DATA:  Pain following recent cholecystectomy EXAM: US ABDOMEN LIMITED - RIGHT UPPER QUADRANT COMPARISON:  August 12, 2015 FINDINGS: Gallbladder: Surgically absent. No lesion is seen by ultrasound in the gallbladder fossa region. Common bile duct: Diameter: 5 mm. No intrahepatic or extrahepatic biliary duct dilatation. Liver: There is an echogenic focus in the left lobe of the liver measuring 1.8 x 0.9 x 1.5 cm. No other focal liver lesion evident. Within normal limits in  parenchymal echogenicity. IMPRESSION: Echogenic focus in the left lobe of the liver, likely a hemangioma. Gallbladder absent. Known lesion is seen by ultrasound in the gallbladder fossa. Note that there is gas from bowel in this area. No biliary duct dilatation evident. Electronically Signed   By: Lowella Grip III M.D.   On: 08/17/2015 09:52    2D Echo: n/a  Cardiac Cath:  Left Heart Cath 08/14/15  Ost Cx to Prox Cx lesion, 10% stenosed.  The left ventricular systolic function is normal.  1. Nonobstructive CAD. The stent in the RCA is widely patent. 2. Normal LV function  Plan: she is clear  to proceed with cholecystectomy from a cardiac standpoint.   Admission HPI:  Kendra Adams is a 55 year old lady with PMH of CAD, MI, HTN, HLD, Hypothyroidism, GERD, and T2DM who presents on post-op day #2 s/p laparscopic cholecystectomy for acute on chronic calculous cholecystitis on 08/15/15. Patient was admitted from 6/4-6/8 for her cholecystitis. Surgery was postoned until clearance by cardiology due to her complaint of anginal type pain similar to her prior MI. Left Heart Cath on 08/14/15 showed CAD with a previously placed patent stent to the right coronary artery and 10% stenosis ostial branch of the circumflex and she was cleared by cardiology on the same day.   After cardiac clearance, she underwent laparscopic cholecystectomy on 11/14/00 without complication. She was able to advance her diet overnight and felt well prior to discharge on 08/16/15. Surgical pathology was without evidence for malignancy. Hep B core antibody, surface antigen, and surface antibody were negative. She had slightly elevated white count (14k), AST/ALT (55/56), felt related to post-op changes as well as improved alk phos (156) from admission.  After discharge patient felt well during the day, ambulating well, and tolerating small portions of oral intake. Around 3 am this morning she was woken from sleep with increased urinary  urgency and frequency. She says her urine was initially orange colored, then yellowish. She did not have dysuria or hematuria. She did not have loss of bowel or bladder control, but did wet herself when she could not make it to the bathroom in time. She reports a fever of 101.4 F, chills, and one episode of watery emesis. She has not had a bowel movement since Tuesday, but reports frequent flatulence. She also reports a "catching pain at her right abdomen under her ribcage when she takes deep breaths. She denies any chest pain, palpitations, difficulty breathing, diaphoresis, leg swelling or pain in her calves. She has taken Tylenol for her pain at home.  In the ED, she was mildly tachycardic, oxygenating well on room air, and afebrile. Lab work was notable for increased AST/ALT (287/241), Alk phos (241), and Tbili (2.9 from 0.4). White count 12.5. UA was negative. CXR was read as questionable early changes of pneumonia.   Social Hx: former smoker 0.5 PPD, quit 2016  Family Hx: DM, HTN in parents  Hospital Course by problem list: Principal Problem:   E coli bacteremia Active Problems:   Hypothyroidism   HLD (hyperlipidemia)   Hypertension associated with diabetes ([place])   CAD (coronary artery disease)   Status post laparoscopic cholecystectomy   Elevated transaminase level   E. Coli bacteremia: 2 out of 2 blood cultures collected on 08/18/15 growing E. Coli. Started on IV Ceftriaxone on 08/19/15. Cultures were sensitive to Ceftriaxone, Cefepime, Cefazolin, Ceftazadime, Pip/tazo, Ciprofloxacin; resistant to Ampicillin, Ampicillin/sulbactam, and Trimeth/sulfa. Patient had one fever of 101.4 on the day of admission, but was otherwise afebrile with stable vital signs and no evidence of sepsis or shock. She was transitioned to oral Cephalexin 500 mg BID to complete a 10 day total antibiotic course.  Elevated LFTs s/p lap chole (08/15/15): Thought secondary to post-op changes versus retained bile duct  stone. CXR on admission showed likely lingular atelectasis. Abdominal U/S was without biliary duct dilatation; CT abd/pelvis was without focal fluid collection or abscess; and HIDA scan was without bile leak. Surgery had seen patient and initially considered MRCP for further evaluation, however this was deferred as LFTs trended down and patient otherwise felt well. Her diet was advanced, which she  tolerated well. She used an incentive spirometer regularly during admission and home Atorvastatin was held during admission and resumed on discharge. Surgery recommended ERCP if patient has recurrence.  Discharge Vitals:   BP 144/83 mmHg  Pulse 71  Temp(Src) 98.5 F (36.9 C) (Oral)  Resp 18  Ht 5' 2"  (1.575 m)  Wt 246 lb 12.8 oz (111.948 kg)  BMI 45.13 kg/m2  SpO2 96%  Discharge Labs:  Results for orders placed or performed during the hospital encounter of 08/17/15 (from the past 24 hour(s))  Glucose, capillary     Status: Abnormal   Collection Time: 08/20/15 11:40 AM  Result Value Ref Range   Glucose-Capillary 175 (H) 65 - 99 mg/dL  Glucose, capillary     Status: Abnormal   Collection Time: 08/20/15  4:36 PM  Result Value Ref Range   Glucose-Capillary 138 (H) 65 - 99 mg/dL  Glucose, capillary     Status: Abnormal   Collection Time: 08/20/15  9:32 PM  Result Value Ref Range   Glucose-Capillary 136 (H) 65 - 99 mg/dL  Comprehensive metabolic panel     Status: Abnormal   Collection Time: 08/21/15  3:53 AM  Result Value Ref Range   Sodium 137 135 - 145 mmol/L   Potassium 3.4 (L) 3.5 - 5.1 mmol/L   Chloride 101 101 - 111 mmol/L   CO2 26 22 - 32 mmol/L   Glucose, Bld 173 (H) 65 - 99 mg/dL   BUN 9 6 - 20 mg/dL   Creatinine, Ser 0.63 0.44 - 1.00 mg/dL   Calcium 9.2 8.9 - 10.3 mg/dL   Total Protein 6.3 (L) 6.5 - 8.1 g/dL   Albumin 2.9 (L) 3.5 - 5.0 g/dL   AST 29 15 - 41 U/L   ALT 83 (H) 14 - 54 U/L   Alkaline Phosphatase 240 (H) 38 - 126 U/L   Total Bilirubin 0.5 0.3 - 1.2 mg/dL   GFR  calc non Af Amer >60 >60 mL/min   GFR calc Af Amer >60 >60 mL/min   Anion gap 10 5 - 15  Glucose, capillary     Status: Abnormal   Collection Time: 08/21/15  7:44 AM  Result Value Ref Range   Glucose-Capillary 144 (H) 65 - 99 mg/dL    Signed: Zada Finders, MD 08/21/2015, 11:17 AM    Services Ordered on Discharge: none Equipment Ordered on Discharge: none

## 2015-08-18 NOTE — Progress Notes (Signed)
Elevated transaminase level  Subjective: Pt admitted after lap chole 6 days ago with vomiting, dysuria and fevers.  Pt with no abd pain.  CT and HIDA neg for bile leak or abscess.    Objective: Vital signs in last 24 hours: Temp:  [98.4 F (36.9 C)-101.4 F (38.6 C)] 98.4 F (36.9 C) (06/10 0925) Pulse Rate:  [82-104] 83 (06/10 0925) Resp:  [14-23] 18 (06/10 0925) BP: (111-155)/(52-83) 115/76 mmHg (06/10 0925) SpO2:  [88 %-96 %] 94 % (06/10 0925) Weight:  [115.8 kg (255 lb 4.7 oz)] 115.8 kg (255 lb 4.7 oz) (06/09 2015) Last BM Date: 08/14/15  Intake/Output from previous day: 06/09 0701 - 06/10 0700 In: 360 [P.O.:360] Out: 0  Intake/Output this shift:   General appearance: alert and cooperative Incision/Wound:clean, intact Abd: soft  Lab Results:  Results for orders placed or performed during the hospital encounter of 08/17/15 (from the past 24 hour(s))  I-Stat CG4 Lactic Acid, ED     Status: None   Collection Time: 08/17/15 12:08 PM  Result Value Ref Range   Lactic Acid, Venous 1.16 0.5 - 2.0 mmol/L  Protime-INR     Status: None   Collection Time: 08/18/15  4:39 AM  Result Value Ref Range   Prothrombin Time 14.9 11.6 - 15.2 seconds   INR 1.16 0.00 - 1.49  CBC     Status: None   Collection Time: 08/18/15  4:39 AM  Result Value Ref Range   WBC 9.6 4.0 - 10.5 K/uL   RBC 4.23 3.87 - 5.11 MIL/uL   Hemoglobin 12.3 12.0 - 15.0 g/dL   HCT 38.4 36.0 - 46.0 %   MCV 90.8 78.0 - 100.0 fL   MCH 29.1 26.0 - 34.0 pg   MCHC 32.0 30.0 - 36.0 g/dL   RDW 14.0 11.5 - 15.5 %   Platelets 220 150 - 400 K/uL  Comprehensive metabolic panel     Status: Abnormal   Collection Time: 08/18/15  4:39 AM  Result Value Ref Range   Sodium 137 135 - 145 mmol/L   Potassium 3.3 (L) 3.5 - 5.1 mmol/L   Chloride 102 101 - 111 mmol/L   CO2 27 22 - 32 mmol/L   Glucose, Bld 146 (H) 65 - 99 mg/dL   BUN <5 (L) 6 - 20 mg/dL   Creatinine, Ser 0.66 0.44 - 1.00 mg/dL   Calcium 9.2 8.9 - 10.3 mg/dL   Total Protein 6.4 (L) 6.5 - 8.1 g/dL   Albumin 3.0 (L) 3.5 - 5.0 g/dL   AST 124 (H) 15 - 41 U/L   ALT 175 (H) 14 - 54 U/L   Alkaline Phosphatase 284 (H) 38 - 126 U/L   Total Bilirubin 4.2 (H) 0.3 - 1.2 mg/dL   GFR calc non Af Amer >60 >60 mL/min   GFR calc Af Amer >60 >60 mL/min   Anion gap 8 5 - 15  Glucose, capillary     Status: Abnormal   Collection Time: 08/18/15  7:39 AM  Result Value Ref Range   Glucose-Capillary 130 (H) 65 - 99 mg/dL     Studies/Results Radiology     MEDS, Scheduled . aspirin  81 mg Oral Daily  . enoxaparin (LOVENOX) injection  40 mg Subcutaneous Q24H  . insulin aspart  0-9 Units Subcutaneous TID WC  . ipratropium  2 spray Each Nare QID  . levothyroxine  162.5 mcg Oral QAC breakfast  . loratadine  10 mg Oral Daily  . losartan  25 mg  Oral Daily  . metoprolol succinate  100 mg Oral Daily  . pantoprazole  40 mg Oral Daily  . potassium chloride  10 mEq Intravenous Q1 Hr x 4  . rOPINIRole  1 mg Oral QHS  . sertraline  150 mg Oral Daily     Assessment: Elevated transaminase level- trending down but bili trending up HIDA shows probable emptying into duodenum.  IOC unable to be performed during surgery.    Plan: [place] for clears today.  Will recheck lft's in AM.  It's unclear to me if this is hepatic dysfuction or obstruction due to a retained stone or both.  Would consider GI and/or MRCP if LFT's cont to trend up.   LOS: 1 day    Rosario Adie, [facility], [place]   08/18/2015 11:31 AM

## 2015-08-19 DIAGNOSIS — R7881 Bacteremia: Secondary | ICD-10-CM | POA: Diagnosis present

## 2015-08-19 LAB — BLOOD CULTURE ID PANEL (REFLEXED)
Acinetobacter baumannii: NOT DETECTED
Candida albicans: NOT DETECTED
Candida glabrata: NOT DETECTED
Candida krusei: NOT DETECTED
Candida parapsilosis: NOT DETECTED
Candida tropicalis: NOT DETECTED
Carbapenem resistance: NOT DETECTED
Enterobacter cloacae complex: NOT DETECTED
Enterobacteriaceae species: DETECTED — AB
Enterococcus species: NOT DETECTED
Escherichia coli: DETECTED — AB
Haemophilus influenzae: NOT DETECTED
Klebsiella oxytoca: NOT DETECTED
Klebsiella pneumoniae: NOT DETECTED
Listeria monocytogenes: NOT DETECTED
Methicillin resistance: NOT DETECTED
Neisseria meningitidis: NOT DETECTED
Proteus species: NOT DETECTED
Pseudomonas aeruginosa: NOT DETECTED
Serratia marcescens: NOT DETECTED
Staphylococcus aureus (BCID): NOT DETECTED
Staphylococcus species: NOT DETECTED
Streptococcus agalactiae: NOT DETECTED
Streptococcus pneumoniae: NOT DETECTED
Streptococcus pyogenes: NOT DETECTED
Streptococcus species: NOT DETECTED
Vancomycin resistance: NOT DETECTED

## 2015-08-19 LAB — COMPREHENSIVE METABOLIC PANEL
ALT: 137 U/L — ABNORMAL HIGH (ref 14–54)
AST: 81 U/L — ABNORMAL HIGH (ref 15–41)
Albumin: 2.8 g/dL — ABNORMAL LOW (ref 3.5–5.0)
Alkaline Phosphatase: 267 U/L — ABNORMAL HIGH (ref 38–126)
Anion gap: 7 (ref 5–15)
BUN: 7 mg/dL (ref 6–20)
CO2: 28 mmol/L (ref 22–32)
Calcium: 9.1 mg/dL (ref 8.9–10.3)
Chloride: 101 mmol/L (ref 101–111)
Creatinine, Ser: 0.63 mg/dL (ref 0.44–1.00)
GFR calc Af Amer: >60 mL/min (ref >60)
GFR calc non Af Amer: >60 mL/min (ref >60)
Glucose, Bld: 132 mg/dL — ABNORMAL HIGH (ref 65–99)
Potassium: 3.9 mmol/L (ref 3.5–5.1)
Sodium: 136 mmol/L (ref 135–145)
Total Bilirubin: 1.8 mg/dL — ABNORMAL HIGH (ref 0.3–1.2)
Total Protein: 6.1 g/dL — ABNORMAL LOW (ref 6.5–8.1)

## 2015-08-19 LAB — GLUCOSE, CAPILLARY
Glucose-Capillary: 151 mg/dL — ABNORMAL HIGH (ref 65–99)
Glucose-Capillary: 156 mg/dL — ABNORMAL HIGH (ref 65–99)
Glucose-Capillary: 168 mg/dL — ABNORMAL HIGH (ref 65–99)
Glucose-Capillary: 165 mg/dL — ABNORMAL HIGH (ref 65–99)

## 2015-08-19 LAB — CBC
HCT: 39.3 % (ref 36.0–46.0)
Hemoglobin: 12.8 g/dL (ref 12.0–15.0)
MCH: 29.6 pg (ref 26.0–34.0)
MCHC: 32.6 g/dL (ref 30.0–36.0)
MCV: 91 fL (ref 78.0–100.0)
Platelets: 237 10*3/uL (ref 150–400)
RBC: 4.32 MIL/uL (ref 3.87–5.11)
RDW: 13.9 % (ref 11.5–15.5)
WBC: 9.8 10*3/uL (ref 4.0–10.5)

## 2015-08-19 MED ORDER — DEXTROSE 5 % IV SOLN
2.0000 g | INTRAVENOUS | Status: DC
  Administered 2015-08-19 – 2015-08-21 (×3): 2 g via INTRAVENOUS
  Filled 2015-08-19 (×3): qty 2

## 2015-08-19 NOTE — Progress Notes (Signed)
Pharmacy Antibiotic Note  Kendra Adams is a 55 y.o. female admitted on 08/17/2015 with urinary frequency/pain with breathing.  Pharmacy has been consulted for Ceftriaxone dosing for bacteremia. Rapid BCID with E Coli. WBC WNL. Pt has been having some low grade fever.   Plan: -Ceftriaxone 2g IV q24h -Trend WBC, temp -F/U for sensitivities   Height: 5' 2"  (157.5 cm) Weight: 244 lb 4.8 oz (110.814 kg) IBW/kg (Calculated) : 50.1  Temp (24hrs), Avg:98.7 F (37.1 C), Min:98.4 F (36.9 C), Max:99.2 F (37.3 C)   Recent Labs Lab 08/13/15 0306  08/14/15 1236 08/15/15 0236 08/16/15 0300 08/17/15 0725 08/17/15 0740 08/17/15 1208 08/18/15 0439  WBC 9.3  --  7.4  --  14.2* 12.5*  --   --  9.6  CREATININE 0.67  < > 0.58 0.66 0.58 0.63  --   --  0.66  LATICACIDVEN  --   --   --   --   --   --  1.24 1.16  --   < > = values in this interval not displayed.  Estimated Creatinine Clearance: 92.2 mL/min (by C-G formula based on Cr of 0.66).    Allergies  Allergen Reactions  . Gabapentin     Skin rash, lip swelling  . Nsaids     IBU - Rectal bleeds; states ASA & Tylenol OK  . Tramadol Hcl     "Just doesn't do anything for me"    Narda Bonds 08/19/2015 3:51 AM

## 2015-08-19 NOTE — Progress Notes (Signed)
.     Subjective: This morning, she denies any complaints. She reports good appetite and proceeded tell me that when she was 14 and her mouth is wired shut, she tolerated Ensure quite well and was wondering if she could have that this morning she feels a little bit depressed about her liquid diet. Overnight, her blood cultures did result with Escherichia coli for which ceftriaxone was started, and I share this information with her.  Objective: Vital signs in last 24 hours: Filed Vitals:   08/18/15 1805 08/18/15 1959 08/19/15 0425 08/19/15 0816  BP: 122/77 101/64 114/69 116/67  Pulse: 91 87 74 90  Temp: 98.9 F (37.2 C) 99.2 F (37.3 C) 98.3 F (36.8 C) 98.3 F (36.8 C)  TempSrc: Oral Oral Oral Oral  Resp: 17 18 18 20   Height:      Weight:  244 lb 4.8 oz (110.814 kg)    SpO2: 98% 93% 95% 93%   Weight change: -10 lb 15.9 oz (-4.986 kg)  Intake/Output Summary (Last 24 hours) at 08/19/15 0272 Last data filed at 08/19/15 5366  Gross per 24 hour  Intake   1190 ml  Output      0 ml  Net   1190 ml   General: sitting up in bed, no acute distress, pleasant HEENT: no scleral icterus, supple neck Cardiac: RRR, no rubs, murmurs or gallops Pulm: distant breath sounds, otherwise clear to auscultation bilaterally Abd: soft, nontender, nondistended, healing ecchymoses across lower left abdomen, surgical incisions clean and intact with mild soreness to palpation Neuro: alert and oriented X3, moving extremities spontaneously   Assessment/Plan: Principal Problem:   E coli bacteremia Active Problems:   Hypothyroidism   HLD (hyperlipidemia)   Hypertension associated with diabetes ([place])   Status post laparoscopic cholecystectomy   Elevated transaminase level  E. Coli bacteremia: As noted on cultures overnight. She was started on ceftriaxone IV. Vital signs are reassuring that she is not septic or in septic shock. No urinary symptoms this morning. No leukocytosis. -Continue ceftriaxone IV  with plan to transition to oral antibiotics pending sensitivities [Day 1/7]  Elevated LFTs s/p lap chole (08/15/15): Post-op day #4. LFTs are all trending down. CXR shows likely lingular atelectasis. Abdominal U/S was without biliary duct dilatation; CT abd/pelvis was without focal fluid collection or abscess; and HIDA scan was without bile leak.  -Advance diet as tolerated -Monitor CMET -Continue Incentive Spirometry every hour while awake -Oxy IR mg po q6h moderate pain  Hypertension: BP 101-122/64-74 overnight. -Continue losartan 25 mg daily -Continue metoprolol 100 mg daily  Hypothyroidism: TSH 10.530 on 08/12/2015, elevated from 1.5 back in March 2017. Suspect there may be non-adherence as usual dosing is 1 microgram per kilogram. -Continue Synthroid 172.5 g for now though will have to assess adherence  Hyperlipidemia: Hold Lipitor 80 mg daily in setting of transaminitis  Coronary artery disease s/p PCI [BMS in 12/2010]:  -Continue aspirin 36m -prn sublingual nitro  Non-insulin-dependent type 2 diabetes: A1c 7.6, December 2016.  -Continue SSI-S  Depression: Continue sertraline 150 mg daily.  Dispo: Disposition is deferred at this time, awaiting improvement of current medical problems.  Anticipated discharge in approximately 1-2 day(s).   The patient does have a current PCP (Loleta Chance MD) and [facility].  The patient does not have transportation limitations that hinder transportation to clinic appointments.    LOS: 2 days   RRiccardo Dubin MD 08/19/2015, 9:37 AM

## 2015-08-19 NOTE — Progress Notes (Signed)
Patient ID: Kendra Adams, female   DOB: [birth year], 55 y.o.   MRN: [id number]     [address] [place], [place] [address]    Phone: [phone] FAX: [phone]     Subjective: No n/v. Pain improved. Hungry.  Having BMs.  Objective:  Vital signs:  Filed Vitals:   08/18/15 1805 08/18/15 1959 08/19/15 0425 08/19/15 0816  BP: 122/77 101/64 114/69 116/67  Pulse: 91 87 74 90  Temp: 98.9 F (37.2 C) 99.2 F (37.3 C) 98.3 F (36.8 C) 98.3 F (36.8 C)  TempSrc: Oral Oral Oral Oral  Resp: _0 Height:      Weight:  110.814 kg (244 lb 4.8 oz)    SpO2: 98% 93% 95% 93%    Last BM Date: 08/18/15  Intake/Output   Yesterday:  06/10 0701 - 06/11 0700 In: 6286 [P.O.:1140; IV Piggyback:50] Out: 0  This shift:    Physical Exam: General: Pt awake/alert/oriented x4 in no acute distress  Abdomen: Soft.  Nondistended.  Non tender.  Incisions are /d/i.   No evidence of peritonitis.  No incarcerated hernias.    Problem List:   Principal Problem:   E coli bacteremia Active Problems:   Hypothyroidism   HLD (hyperlipidemia)   Hypertension associated with diabetes ([place])   Status post laparoscopic cholecystectomy   Elevated transaminase level    Results:   Labs: Results for orders placed or performed during the hospital encounter of 08/17/15 (from the past 48 hour(s))  I-Stat CG4 Lactic Acid, ED     Status: None   Collection Time: 08/17/15 12:08 PM  Result Value Ref Range   Lactic Acid, Venous 1.16 0.5 - 2.0 mmol/L  Protime-INR     Status: None   Collection Time: 08/18/15  4:39 AM  Result Value Ref Range   Prothrombin Time 14.9 11.6 - 15.2 seconds   INR 1.16 0.00 - 1.49  CBC     Status: None   Collection Time: 08/18/15  4:39 AM  Result Value Ref Range   WBC 9.6 4.0 - 10.5 K/uL   RBC 4.23 3.87 - 5.11 MIL/uL   Hemoglobin 12.3 12.0 - 15.0 g/dL   HCT 38.4 36.0 - 46.0 %   MCV 90.8 78.0 - 100.0 fL    MCH 29.1 26.0 - 34.0 pg   MCHC 32.0 30.0 - 36.0 g/dL   RDW 14.0 11.5 - 15.5 %   Platelets 220 150 - 400 K/uL  Comprehensive metabolic panel     Status: Abnormal   Collection Time: 08/18/15  4:39 AM  Result Value Ref Range   Sodium 137 135 - 145 mmol/L   Potassium 3.3 (L) 3.5 - 5.1 mmol/L   Chloride 102 101 - 111 mmol/L   CO2 27 22 - 32 mmol/L   Glucose, Bld 146 (H) 65 - 99 mg/dL   BUN <5 (L) 6 - 20 mg/dL   Creatinine, Ser 0.66 0.44 - 1.00 mg/dL   Calcium 9.2 8.9 - 10.3 mg/dL   Total Protein 6.4 (L) 6.5 - 8.1 g/dL   Albumin 3.0 (L) 3.5 - 5.0 g/dL   AST 124 (H) 15 - 41 U/L   ALT 175 (H) 14 - 54 U/L   Alkaline Phosphatase 284 (H) 38 - 126 U/L   Total Bilirubin 4.2 (H) 0.3 - 1.2 mg/dL   GFR calc non Af Amer >60 >60 mL/min   GFR calc Af  Amer >60 >60 mL/min    Comment: (NOTE) The eGFR has been calculated using the CKD EPI equation. This calculation has not been validated in all clinical situations. eGFR's persistently <60 mL/min signify possible Chronic Kidney Disease.    Anion gap 8 5 - 15  Glucose, capillary     Status: Abnormal   Collection Time: 08/18/15  7:39 AM  Result Value Ref Range   Glucose-Capillary 130 (H) 65 - 99 mg/dL  Culture, blood (routine x 2)     Status: None (Preliminary result)   Collection Time: 08/18/15  8:50 AM  Result Value Ref Range   Specimen Description BLOOD RIGHT ANTECUBITAL    Special Requests BOTTLES DRAWN AEROBIC AND ANAEROBIC 10CC    Culture  Setup Time      GRAM NEGATIVE RODS IN BOTH AEROBIC AND ANAEROBIC BOTTLES CRITICAL RESULT CALLED TO, READ BACK BY AND VERIFIED WITH: J LEDFORD,PHARMD AT 0612 08/19/15 BY L BENFIELD    Culture PENDING    Report Status PENDING   Culture, blood (routine x 2)     Status: None (Preliminary result)   Collection Time: 08/18/15  8:55 AM  Result Value Ref Range   Specimen Description BLOOD LEFT ANTECUBITAL    Special Requests BOTTLES DRAWN AEROBIC AND ANAEROBIC 10CC    Culture  Setup Time      GRAM NEGATIVE  RODS IN BOTH AEROBIC AND ANAEROBIC BOTTLES CRITICAL RESULT CALLED TO, READ BACK BY AND VERIFIED WITH: J LEDFORD,PHARMD AT 0300 08/19/15 BY M KELLY    Culture PENDING    Report Status PENDING   Blood Culture ID Panel (Reflexed)     Status: Abnormal   Collection Time: 08/18/15  8:55 AM  Result Value Ref Range   Enterococcus species NOT DETECTED NOT DETECTED   Vancomycin resistance NOT DETECTED NOT DETECTED   Listeria monocytogenes NOT DETECTED NOT DETECTED   Staphylococcus species NOT DETECTED NOT DETECTED   Staphylococcus aureus NOT DETECTED NOT DETECTED   Methicillin resistance NOT DETECTED NOT DETECTED   Streptococcus species NOT DETECTED NOT DETECTED   Streptococcus agalactiae NOT DETECTED NOT DETECTED   Streptococcus pneumoniae NOT DETECTED NOT DETECTED   Streptococcus pyogenes NOT DETECTED NOT DETECTED   Acinetobacter baumannii NOT DETECTED NOT DETECTED   Enterobacteriaceae species DETECTED (A) NOT DETECTED    Comment: JAMES LEDFORD,PHARMD _0  08/19/15 MKELLY   Enterobacter cloacae complex NOT DETECTED NOT DETECTED   Escherichia coli DETECTED (A) NOT DETECTED    Comment: JAMES LEDFORD,PHARMD _1  08/19/15 MKELLY   Klebsiella oxytoca NOT DETECTED NOT DETECTED   Klebsiella pneumoniae NOT DETECTED NOT DETECTED   Proteus species NOT DETECTED NOT DETECTED   Serratia marcescens NOT DETECTED NOT DETECTED   Carbapenem resistance NOT DETECTED NOT DETECTED   Haemophilus influenzae NOT DETECTED NOT DETECTED   Neisseria meningitidis NOT DETECTED NOT DETECTED   Pseudomonas aeruginosa NOT DETECTED NOT DETECTED   Candida albicans NOT DETECTED NOT DETECTED   Candida glabrata NOT DETECTED NOT DETECTED   Candida krusei NOT DETECTED NOT DETECTED   Candida parapsilosis NOT DETECTED NOT DETECTED   Candida tropicalis NOT DETECTED NOT DETECTED  Glucose, capillary     Status: Abnormal   Collection Time: 08/18/15 11:59 AM  Result Value Ref Range   Glucose-Capillary 143 (H) 65 - 99 mg/dL   Glucose, capillary     Status: Abnormal   Collection Time: 08/18/15  6:07 PM  Result Value Ref Range   Glucose-Capillary 193 (H) 65 - 99 mg/dL  Glucose, capillary  Status: Abnormal   Collection Time: 08/18/15  8:18 PM  Result Value Ref Range   Glucose-Capillary 145 (H) 65 - 99 mg/dL  Comprehensive metabolic panel     Status: Abnormal   Collection Time: 08/19/15  4:32 AM  Result Value Ref Range   Sodium 136 135 - 145 mmol/L   Potassium 3.9 3.5 - 5.1 mmol/L   Chloride 101 101 - 111 mmol/L   CO2 28 22 - 32 mmol/L   Glucose, Bld 132 (H) 65 - 99 mg/dL   BUN 7 6 - 20 mg/dL   Creatinine, Ser 0.63 0.44 - 1.00 mg/dL   Calcium 9.1 8.9 - 10.3 mg/dL   Total Protein 6.1 (L) 6.5 - 8.1 g/dL   Albumin 2.8 (L) 3.5 - 5.0 g/dL   AST 81 (H) 15 - 41 U/L   ALT 137 (H) 14 - 54 U/L   Alkaline Phosphatase 267 (H) 38 - 126 U/L   Total Bilirubin 1.8 (H) 0.3 - 1.2 mg/dL   GFR calc non Af Amer >60 >60 mL/min   GFR calc Af Amer >60 >60 mL/min    Comment: (NOTE) The eGFR has been calculated using the CKD EPI equation. This calculation has not been validated in all clinical situations. eGFR's persistently <60 mL/min signify possible Chronic Kidney Disease.    Anion gap 7 5 - 15  Glucose, capillary     Status: Abnormal   Collection Time: 08/19/15  8:13 AM  Result Value Ref Range   Glucose-Capillary 151 (H) 65 - 99 mg/dL  CBC     Status: None   Collection Time: 08/19/15  8:19 AM  Result Value Ref Range   WBC 9.8 4.0 - 10.5 K/uL   RBC 4.32 3.87 - 5.11 MIL/uL   Hemoglobin 12.8 12.0 - 15.0 g/dL   HCT 39.3 36.0 - 46.0 %   MCV 91.0 78.0 - 100.0 fL   MCH 29.6 26.0 - 34.0 pg   MCHC 32.6 30.0 - 36.0 g/dL   RDW 13.9 11.5 - 15.5 %   Platelets 237 150 - 400 K/uL    Imaging / Studies: Nm Hepatobiliary Including Gb  08/17/2015  CLINICAL DATA:  Status post laparoscopic cholecystectomy 08/15/2015. Evaluate for possible bile leak. Abdominal pain, emesis and fever. Elevated liver function studies. EXAM:  NUCLEAR MEDICINE HEPATOBILIARY IMAGING TECHNIQUE: Sequential images of the abdomen were obtained out to 60 minutes following intravenous administration of radiopharmaceutical. RADIOPHARMACEUTICALS:  5.3 mCi Tc-26m Choletec IV COMPARISON:  CT scan 08/17/2015 FINDINGS: Homogeneous uptake in the liver. No obvious biliary activity or small bowel activity during the first hour of scanning. During the second hour the liver retains a significant amount of the radiopharmaceutical. However there is some activity noted in the common bile duct and probable duodenum. No leak is identified. IMPRESSION: 1. Persistent hepatic activity most likely due to hepatic dysfunction. Elevated liver function studies and elevated bilirubin are noted. 2. No findings suspicious for a bile leak. Electronically Signed   By: PMarijo SanesM.D.   On: 08/17/2015 16:31   Ct Abdomen Pelvis W Contrast  08/17/2015  CLINICAL DATA:  55year old female with a history of prior laparoscopic cholecystectomy 2 days ago EXAM: CT ABDOMEN AND PELVIS WITH CONTRAST TECHNIQUE: Multidetector CT imaging of the abdomen and pelvis was performed using the standard protocol following bolus administration of intravenous contrast. CONTRAST:  1 ISOVUE-300 IOPAMIDOL (ISOVUE-300) INJECTION 61% COMPARISON:  No prior CT. Ultrasound 08/17/2015 FINDINGS: Lower chest: Unremarkable appearance of the soft tissues of the  chest wall. Heart size within normal limits.  No pericardial fluid/thickening. Calcifications in the distribution of the left anterior descending, circumflex, right coronary arteries. No lower mediastinal adenopathy. Unremarkable appearance of the distal esophagus. Small hiatal hernia. No confluent airspace disease, pleural fluid, or pneumothorax within visualized lung. Abdomen/pelvis: Cranial caudal span of the liver measures 20 cm. Unremarkable spleen. Unremarkable appearance of bilateral adrenal glands. Cholecystectomy. Minimal inflammatory changes in the region  of the gallbladder surgical bed. No focal fluid collection. Unremarkable appearance of the pancreas. No intrahepatic or extrahepatic biliary ductal dilatation. No intra-peritoneal free air or significant free-fluid. No abnormally dilated small bowel or colon. No transition point. No inflammatory changes of the mesenteries. "Hammock" configuration of the course of the duodenum. Enteric contrast filled duodenum diverticulum. Normal appendix identified. Colonic diverticula without associated inflammatory changes. Right Kidney/Ureter: No hydronephrosis. No nephrolithiasis. No perinephric stranding. Unremarkable course of the right ureter. Left Kidney/Ureter: No hydronephrosis. No nephrolithiasis. No perinephric stranding. Unremarkable course of the left ureter. Unremarkable appearance of the urinary bladder. Unremarkable appearance of the uterus. Tubular fluid-filled configuration of the left adnexa, compatible with hydrosalpinx. Unremarkable right adnexa. No inflammatory changes associated with the left adnexa. Scattered vascular calcifications. No aneurysm. No periaortic fluid. Musculoskeletal: No displaced fracture. Surgical changes of prior percutaneous port placement in the right upper quadrant and at the emboli kiss, status post laparoscopic cholecystectomy. Multilevel degenerative changes of the spine. Nonspecific pelvic floor laxity. IMPRESSION: Early surgical changes of recent laparoscopic cholecystectomy, without focal fluid collection or evidence of abscess. Left-sided hydrosalpinx. Sterility not assessed by CT, however, there are no adjacent inflammatory changes. Referral for Ob GYN evaluation may be useful. Diverticular disease without evidence of acute diverticulitis. Atherosclerosis and coronary artery disease. Signed, Dulcy Fanny. Earleen Newport, DO [facility] Electronically Signed   By: Corrie Mckusick D.O.   On: 08/17/2015 13:40    Medications /  Allergies:  Scheduled Meds: . aspirin  81 mg Oral Daily  . cefTRIAXone (ROCEPHIN)  IV  2 g Intravenous Q24H  . enoxaparin (LOVENOX) injection  40 mg Subcutaneous Q24H  . feeding supplement (PRO-STAT SUGAR FREE 64)  30 mL Oral BID  . insulin aspart  0-9 Units Subcutaneous TID WC  . ipratropium  2 spray Each Nare QID  . levothyroxine  162.5 mcg Oral QAC breakfast  . loratadine  10 mg Oral Daily  . losartan  25 mg Oral Daily  . metoprolol succinate  100 mg Oral Daily  . pantoprazole  40 mg Oral Daily  . rOPINIRole  1 mg Oral QHS  . senna-docusate  1 tablet Oral QHS  . sertraline  150 mg Oral Daily   Continuous Infusions:  PRN Meds:.albuterol, nitroGLYCERIN, ondansetron **OR** ondansetron (ZOFRAN) IV, oxyCODONE  Antibiotics: Anti-infectives    Start     Dose/Rate Route Frequency Ordered Stop   08/19/15 0345  cefTRIAXone (ROCEPHIN) 2 g in dextrose 5 % 50 mL IVPB     2 g 100 mL/hr over 30 Minutes Intravenous Every 24 hours 08/19/15 0331     08/17/15 1600  levofloxacin (LEVAQUIN) IVPB 750 mg  Status:  Discontinued     750 mg 100 mL/hr over 90 Minutes Intravenous  Once 08/17/15 1546 08/17/15 1828        Assessment/Plan s/p laparoscopic cholecystectomy 08/12/15 --Dr. Brantley Stage Elevated LFTs-Hepatic dysfunction v retained CBD stone.  LFTs are trending down, will hold off on MRCP.  Advance diet. -consider hepatitis panel ID-GNR bacteremia, on rocephin FEN-advance diet  Meleena Munroe,  ANP-[facility]   08/19/2015 10:04 AM

## 2015-08-19 NOTE — Plan of Care (Signed)
Problem: Activity: Goal: Risk for activity intolerance will decrease Outcome: Progressing Pt independently ambulating in the halls.

## 2015-08-20 DIAGNOSIS — R7881 Bacteremia: Secondary | ICD-10-CM

## 2015-08-20 DIAGNOSIS — R945 Abnormal results of liver function studies: Secondary | ICD-10-CM

## 2015-08-20 LAB — CULTURE, BLOOD (ROUTINE X 2)

## 2015-08-20 LAB — GLUCOSE, CAPILLARY
Glucose-Capillary: 128 mg/dL — ABNORMAL HIGH (ref 65–99)
Glucose-Capillary: 128 mg/dL — ABNORMAL HIGH (ref 65–99)
Glucose-Capillary: 175 mg/dL — ABNORMAL HIGH (ref 65–99)
Glucose-Capillary: 138 mg/dL — ABNORMAL HIGH (ref 65–99)
Glucose-Capillary: 136 mg/dL — ABNORMAL HIGH (ref 65–99)

## 2015-08-20 LAB — HEPATIC FUNCTION PANEL
ALT: 108 U/L — ABNORMAL HIGH (ref 14–54)
AST: 50 U/L — ABNORMAL HIGH (ref 15–41)
Albumin: 2.9 g/dL — ABNORMAL LOW (ref 3.5–5.0)
Alkaline Phosphatase: 247 U/L — ABNORMAL HIGH (ref 38–126)
Bilirubin, Direct: 0.3 mg/dL (ref 0.1–0.5)
Indirect Bilirubin: 0.3 mg/dL (ref 0.3–0.9)
Total Bilirubin: 0.6 mg/dL (ref 0.3–1.2)
Total Protein: 6.3 g/dL — ABNORMAL LOW (ref 6.5–8.1)

## 2015-08-20 NOTE — Progress Notes (Signed)
.     Subjective: Patient feels well this morning, using the incentive spirometer frequently. She denies any pain and reports the "catching" pain with deep breaths is improving. She is eating well.  Objective: Vital signs in last 24 hours: Filed Vitals:   08/19/15 1644 08/19/15 2008 08/20/15 0414 08/20/15 0744  BP: 124/81 117/73 115/60 131/78  Pulse: 84 87 77 82  Temp: 98.5 F (36.9 C) 99 F (37.2 C) 97.6 F (36.4 C) 98.2 F (36.8 C)  TempSrc: Oral Oral Oral Oral  Resp: 18 18 18 17   Height:      Weight:  246 lb 12.8 oz (111.948 kg)    SpO2: 95% 95% 93% 94%   Weight change: 2 lb 8 oz (1.134 kg)  Intake/Output Summary (Last 24 hours) at 08/20/15 0945 Last data filed at 08/20/15 0600  Gross per 24 hour  Intake   1490 ml  Output      0 ml  Net   1490 ml   General: sitting up in bed, no acute distress, pleasant HEENT: no scleral icterus, supple neck Cardiac: RRR, no rubs, murmurs or gallops Pulm: distant breath sounds, otherwise clear to auscultation bilaterally Abd: soft, nontender, nondistended, healing ecchymoses across lower left abdomen, surgical incisions clean and intact   Assessment/Plan: Principal Problem:   E coli bacteremia Active Problems:   Hypothyroidism   HLD (hyperlipidemia)   Hypertension associated with diabetes ([facility])   Status post laparoscopic cholecystectomy   Elevated transaminase level  E. Coli bacteremia: 2/2 blood cultures collected on 08/18/15. Started on IV Ceftriaxone on 08/19/15. Cultures are sensitive to Ceftriaxone, Pip/tazo, Cipro; resistant to Ampicillin, Ampicillin/sulbactam, and Trimeth/sulfa. Patient is afebrile with stable vital signs. -Continue ceftriaxone IV [Day 2/7] with plan to transition to oral antibiotics tomorrow  Elevated LFTs s/p lap chole (08/15/15): Post-op day #5. LFTs are all trending down, Tbili has normalized. CXR showed likely lingular atelectasis. Abdominal U/S was without biliary duct dilatation; CT abd/pelvis was  without focal fluid collection or abscess; and HIDA scan was without bile leak.  -Advance diet as tolerated -Monitor CMET -Continue Incentive Spirometry every hour while awake -Oxy IR mg po q6h moderate pain  Hypertension: BPs stable. -Continue losartan 25 mg daily -Continue metoprolol 100 mg daily  Hypothyroidism: TSH 10.530 on 08/12/2015, elevated from 1.5 back in March 2017. Suspect there may be non-adherence as usual dosing is 1 microgram per kilogram. -Continue Synthroid 172.5 g for now though will have to assess adherence  Hyperlipidemia: Hold Lipitor 80 mg daily in setting of transaminitis  Coronary artery disease s/p PCI [BMS in 12/2010]:  -Continue aspirin 55m -prn sublingual nitro  Non-insulin-dependent type 2 diabetes: A1c 7.6, December 2016.  -Continue SSI-S  Depression: Continue sertraline 150 mg daily.  Dispo: Disposition is deferred at this time, awaiting improvement of current medical problems.  Anticipated discharge in approximately 1 day(s).   The patient does have a current PCP (Loleta Chance MD) and [facility].  The patient does not have transportation limitations that hinder transportation to clinic appointments.    LOS: 3 days   VZada Finders MD 08/20/2015, 9:45 AM

## 2015-08-20 NOTE — Progress Notes (Signed)
Nutrition Brief Note  Patient identified on the Malnutrition Screening Tool (MST) Report  Per pt she has had no recent weight changes. Good appetite. Ready to go home. Had questions about healthy diet after d/c. Discussed healthy diet for home.   Wt Readings from Last 15 Encounters:  08/19/15 246 lb 12.8 oz (111.948 kg)  08/16/15 247 lb 6.4 oz (112.22 kg)  07/05/15 256 lb 14.4 oz (116.529 kg)  05/16/15 258 lb 1.6 oz (117.073 kg)  02/26/15 261 lb 1.6 oz (118.434 kg)  02/13/15 259 lb (117.482 kg)  01/19/15 256 lb 8 oz (116.348 kg)  11/20/14 256 lb 14.4 oz (116.529 kg)  09/07/14 256 lb 1.6 oz (116.166 kg)  08/21/14 254 lb 12.8 oz (115.577 kg)  08/03/14 254 lb 3.2 oz (115.304 kg)  07/25/14 256 lb 8 oz (116.348 kg)  07/17/14 258 lb 1.6 oz (117.073 kg)  07/05/14 251 lb 12.8 oz (114.216 kg)  05/23/14 252 lb 1.6 oz (114.352 kg)    Body mass index is 45.13 kg/(m^2). Patient meets criteria for morbid obesity, class III based on current BMI.   Current diet order is Soft, patient is consuming approximately 100% of meals at this time. Labs and medications reviewed.   No nutrition interventions warranted at this time. If nutrition issues arise, please consult RD.   [place], [place], [place] Pager [phone] After Hours Pager

## 2015-08-20 NOTE — Progress Notes (Signed)
Patient ID: Kendra Adams, female   DOB: June 07, 1960, 55 y.o.   MRN: [id number]     [address] [place], [place] [address]    Phone: [phone] FAX: [phone]     Subjective: No n/v. Passing flatus. No pain. LFTs down, TB normal.  Ast/alt 50/108.   Objective:  Vital signs:  Filed Vitals:   08/19/15 1644 08/19/15 2008 08/20/15 0414 08/20/15 0744  BP: 124/81 117/73 115/60 131/78  Pulse: 84 87 77 82  Temp: 98.5 F (36.9 C) 99 F (37.2 C) 97.6 F (36.4 C) 98.2 F (36.8 C)  TempSrc: Oral Oral Oral Oral  Resp: 18 18 18 17   Height:      Weight:  111.948 kg (246 lb 12.8 oz)    SpO2: 95% 95% 93% 94%    Last BM Date: 08/18/15  Intake/Output   Yesterday:  06/11 0701 - 06/12 0700 In: 1490 [P.O.:1440; IV Piggyback:50] Out: 0  This shift:        Physical Exam: General: Pt awake/alert/oriented x4 in no acute distress  Abdomen: Soft. Nondistended. Non tender. Incisions are /d/i. No evidence of peritonitis. No incarcerated hernias.   Problem List:   Principal Problem:   E coli bacteremia Active Problems:   Hypothyroidism   HLD (hyperlipidemia)   Hypertension associated with diabetes [facility])   Status post laparoscopic cholecystectomy   Elevated transaminase level    Results:   Labs: Results for orders placed or performed during the hospital encounter of 08/17/15 (from the past 48 hour(s))  Glucose, capillary     Status: Abnormal   Collection Time: 08/18/15 11:59 AM  Result Value Ref Range   Glucose-Capillary 143 (H) 65 - 99 mg/dL  Glucose, capillary     Status: Abnormal   Collection Time: 08/18/15  6:07 PM  Result Value Ref Range   Glucose-Capillary 193 (H) 65 - 99 mg/dL  Glucose, capillary     Status: Abnormal   Collection Time: 08/18/15  8:18 PM  Result Value Ref Range   Glucose-Capillary 145 (H) 65 - 99 mg/dL  Comprehensive metabolic panel     Status: Abnormal   Collection  Time: 08/19/15  4:32 AM  Result Value Ref Range   Sodium 136 135 - 145 mmol/L   Potassium 3.9 3.5 - 5.1 mmol/L   Chloride 101 101 - 111 mmol/L   CO2 28 22 - 32 mmol/L   Glucose, Bld 132 (H) 65 - 99 mg/dL   BUN 7 6 - 20 mg/dL   Creatinine, Ser 0.63 0.44 - 1.00 mg/dL   Calcium 9.1 8.9 - 10.3 mg/dL   Total Protein 6.1 (L) 6.5 - 8.1 g/dL   Albumin 2.8 (L) 3.5 - 5.0 g/dL   AST 81 (H) 15 - 41 U/L   ALT 137 (H) 14 - 54 U/L   Alkaline Phosphatase 267 (H) 38 - 126 U/L   Total Bilirubin 1.8 (H) 0.3 - 1.2 mg/dL   GFR calc non Af Amer >60 >60 mL/min   GFR calc Af Amer >60 >60 mL/min    Comment: (NOTE) The eGFR has been calculated using the CKD EPI equation. This calculation has not been validated in all clinical situations. eGFR's persistently <60 mL/min signify possible Chronic Kidney Disease.    Anion gap 7 5 - 15  Glucose, capillary     Status: Abnormal   Collection Time: 08/19/15  8:13 AM  Result Value Ref Range  Glucose-Capillary 151 (H) 65 - 99 mg/dL  CBC     Status: None   Collection Time: 08/19/15  8:19 AM  Result Value Ref Range   WBC 9.8 4.0 - 10.5 K/uL   RBC 4.32 3.87 - 5.11 MIL/uL   Hemoglobin 12.8 12.0 - 15.0 g/dL   HCT 39.3 36.0 - 46.0 %   MCV 91.0 78.0 - 100.0 fL   MCH 29.6 26.0 - 34.0 pg   MCHC 32.6 30.0 - 36.0 g/dL   RDW 13.9 11.5 - 15.5 %   Platelets 237 150 - 400 K/uL  Glucose, capillary     Status: Abnormal   Collection Time: 08/19/15 11:30 AM  Result Value Ref Range   Glucose-Capillary 156 (H) 65 - 99 mg/dL  Glucose, capillary     Status: Abnormal   Collection Time: 08/19/15  4:47 PM  Result Value Ref Range   Glucose-Capillary 168 (H) 65 - 99 mg/dL  Glucose, capillary     Status: Abnormal   Collection Time: 08/19/15  8:07 PM  Result Value Ref Range   Glucose-Capillary 165 (H) 65 - 99 mg/dL  Hepatic function panel     Status: Abnormal   Collection Time: 08/20/15  3:54 AM  Result Value Ref Range   Total Protein 6.3 (L) 6.5 - 8.1 g/dL   Albumin 2.9 (L)  3.5 - 5.0 g/dL   AST 50 (H) 15 - 41 U/L   ALT 108 (H) 14 - 54 U/L   Alkaline Phosphatase 247 (H) 38 - 126 U/L   Total Bilirubin 0.6 0.3 - 1.2 mg/dL   Bilirubin, Direct 0.3 0.1 - 0.5 mg/dL   Indirect Bilirubin 0.3 0.3 - 0.9 mg/dL  Glucose, capillary     Status: Abnormal   Collection Time: 08/20/15  7:47 AM  Result Value Ref Range   Glucose-Capillary 128 (H) 65 - 99 mg/dL    Imaging / Studies: No results found.  Medications / Allergies:  Scheduled Meds: . aspirin  81 mg Oral Daily  . cefTRIAXone (ROCEPHIN)  IV  2 g Intravenous Q24H  . enoxaparin (LOVENOX) injection  40 mg Subcutaneous Q24H  . feeding supplement (PRO-STAT SUGAR FREE 64)  30 mL Oral BID  . insulin aspart  0-9 Units Subcutaneous TID WC  . ipratropium  2 spray Each Nare QID  . levothyroxine  162.5 mcg Oral QAC breakfast  . loratadine  10 mg Oral Daily  . losartan  25 mg Oral Daily  . metoprolol succinate  100 mg Oral Daily  . pantoprazole  40 mg Oral Daily  . rOPINIRole  1 mg Oral QHS  . senna-docusate  1 tablet Oral QHS  . sertraline  150 mg Oral Daily   Continuous Infusions:  PRN Meds:.albuterol, nitroGLYCERIN, ondansetron **OR** ondansetron (ZOFRAN) IV, oxyCODONE  Antibiotics: Anti-infectives    Start     Dose/Rate Route Frequency Ordered Stop   08/19/15 0345  cefTRIAXone (ROCEPHIN) 2 g in dextrose 5 % 50 mL IVPB     2 g 100 mL/hr over 30 Minutes Intravenous Every 24 hours 08/19/15 0331     08/17/15 1600  levofloxacin (LEVAQUIN) IVPB 750 mg  Status:  Discontinued     750 mg 100 mL/hr over 90 Minutes Intravenous  Once 08/17/15 1546 08/17/15 1828        Assessment/Plan s/p laparoscopic cholecystectomy 08/12/15 --Dr. Brantley Stage Elevated LFTs-Hepatic dysfunction v retained CBD stone in which case she has probably passed it.  LFTs are improving with a normal TB and ast/alt down  to 60/108.tolerating a diet and non tender. -consider hepatitis panel ID-GNR bacteremia, on rocephin FEN-no issues. Dispo-per  primary team.  Please call surgery for further assistance.   Erby Pian, [facility] Surgery Pager [phone]) For consults and floor pages call [phone](7A-4:30P)  08/20/2015 9:35 AM

## 2015-08-20 NOTE — Progress Notes (Signed)
Pharmacy - Antibiotics  Afeb, WBC normal, on ceftriaxone 6/10 BCx: E coli sensitive to cephalosporins No adjustment to ceftriaxone needed  Plan: Recommend change to cephalexin 500 mg PO bid  Recommend 10-14 days of total treatment Pharmacy signing off  [facility], [place].D., BCPS Clinical Pharmacist Pager: [phone] 08/20/2015 12:47 PM

## 2015-08-21 ENCOUNTER — Telehealth: Payer: Self-pay | Admitting: Internal Medicine

## 2015-08-21 LAB — COMPREHENSIVE METABOLIC PANEL
ALT: 83 U/L — ABNORMAL HIGH (ref 14–54)
AST: 29 U/L (ref 15–41)
Albumin: 2.9 g/dL — ABNORMAL LOW (ref 3.5–5.0)
Alkaline Phosphatase: 240 U/L — ABNORMAL HIGH (ref 38–126)
Anion gap: 10 (ref 5–15)
BUN: 9 mg/dL (ref 6–20)
CO2: 26 mmol/L (ref 22–32)
Calcium: 9.2 mg/dL (ref 8.9–10.3)
Chloride: 101 mmol/L (ref 101–111)
Creatinine, Ser: 0.63 mg/dL (ref 0.44–1.00)
GFR calc Af Amer: >60 mL/min (ref >60)
GFR calc non Af Amer: >60 mL/min (ref >60)
Glucose, Bld: 173 mg/dL — ABNORMAL HIGH (ref 65–99)
Potassium: 3.4 mmol/L — ABNORMAL LOW (ref 3.5–5.1)
Sodium: 137 mmol/L (ref 135–145)
Total Bilirubin: 0.5 mg/dL (ref 0.3–1.2)
Total Protein: 6.3 g/dL — ABNORMAL LOW (ref 6.5–8.1)

## 2015-08-21 LAB — GLUCOSE, CAPILLARY
Glucose-Capillary: 144 mg/dL — ABNORMAL HIGH (ref 65–99)
Glucose-Capillary: 152 mg/dL — ABNORMAL HIGH (ref 65–99)

## 2015-08-21 MED ORDER — POTASSIUM CHLORIDE CRYS ER 20 MEQ PO TBCR
40.0000 meq | EXTENDED_RELEASE_TABLET | Freq: Once | ORAL | Status: AC
  Administered 2015-08-21: 40 meq via ORAL
  Filled 2015-08-21: qty 2

## 2015-08-21 MED ORDER — CEPHALEXIN 500 MG PO CAPS: 500.0000 mg | ORAL_CAPSULE | Freq: Two times a day (BID) | ORAL | Status: DC

## 2015-08-21 MED ORDER — OXYCODONE HCL 5 MG PO TABS
5.0000 mg | ORAL_TABLET | Freq: Every evening | ORAL | Status: DC | PRN
Start: 2015-08-21 — End: 2016-02-25

## 2015-08-21 NOTE — Telephone Encounter (Signed)
Called and got in touch with patient; she says she is "Doing a whole lot better." after her second hospitalization. Her sore on her toe is better, her blood sugars are controlled, she is eating a soft diet and is aware of her hospital follow up appointment next week.

## 2015-08-21 NOTE — Telephone Encounter (Signed)
Transition Care Management Follow-up Telephone Call   Date discharged?today 6/13   How have you been since you were released from the hospital? Not sure   Do you understand why you were in the hospital? Yes, gallbladder taken out   Do you understand the discharge instructions? yes   Where were you discharged to? home   Items Reviewed:  Medications reviewed: yes  Allergies reviewed: yes  Dietary changes reviewed: yes, "i know i need to eat small meals"  Referrals reviewed: yes   Functional Questionnaire:   Activities of Daily Living (ADLs):   She states they are independent in the following: i am good, im driving and out shopping now States they require assistance with the following: no problems   Any transportation issues/concerns?: no   Any patient concerns? no   Confirmed importance and date/time of follow-up visits scheduled yes  Provider Appointment booked with 6/20 dr Genene Churn  Confirmed with patient if condition begins to worsen call PCP or go to the ER.  Patient was given the office number and encouraged to call back with question or concerns.  : yes The nurses are all good and were great to me but i think they need more help, everyone seems way to busy, my hat is off to everyone"

## 2015-08-21 NOTE — Progress Notes (Signed)
Patient Discharge: Yes  Disposition: Willing and ready. Wheelchair. Husband pick up to home.  Education: Given  IV: Removed  Telemetry: None  Follow-up appointments: yes, reviewed  Prescriptions: Script to pharmacy and Oxy RX in [employer] planner: Husband  Belongings: All belongings in tow with patient.

## 2015-08-21 NOTE — Discharge Instructions (Signed)
Your blood cultures show E coli growing in your blood. You received 3 days of IV antibiotics and we are prescribing you an oral antibiotic to complete 10 total days. -Take Cephalexin (Keflex) 500 mg twice a day for 7 days  Your increased liver numbers were likely due either to your normal body reaction after surgery, or you may have had a retained stone that you passed on your own after the surgery.  Please follow up with [facility] Surgery and the Internal Medicine Clinic     Atelectasis, Adult Atelectasis is a collapse of the small air sacs in the lungs (alveoli). When this occurs, all or part of a lung collapses and becomes airless. It can be caused by various things and is a common problem after surgery. The severity of atelectasis will vary depending on the size of the area involved and the underlying cause of the condition. CAUSES  There are multiple causes for atelectasis:   Shallow breathing, particularly if there is an injury to your chest wall or abdomen that makes it painful to take a deep breath. This commonly occurs after surgery.  Obstruction of your airways (bronchi or bronchioles). This may be caused by a buildup of mucus (mucus plug), tumors, blood clots (pulmonary embolus), or inhaled foreign bodies. Mucus plugs occur when the lungs do not expand enough to get rid of mucus.  Outside pressure on the lung. This may be caused by tumors, fluid (pleural effusion), or a leakage of air between the lung and rib cage (pneumothorax).   Infections such as pneumonia.  Scarring in lung tissue left over from previous infection or injury.  Some diseases such as cystic fibrosis. SIGNS AND SYMPTOMS  Often, atelectasis will have no symptoms. When symptoms occur, they include:  Shortness of breath.   Bluish color to your nails, lips, or mouth (cyanosis). DIAGNOSIS  Your health care provider may suspect atelectasis based on symptoms and physical findings. A chest X-ray may be  done to confirm the diagnosis. More specialized X-ray exams are sometimes required.  TREATMENT  Treatment will depend on the cause of the atelectasis. Treatment may include:  Purposeful coughing to loosen mucus plugs in the lungs.  Chest physiotherapy. This consists of clapping or percussion on the chest over the lungs to further loosen mucus plugs.  Postural drainage techniques. This involves positioning your body so your head is lower than your chest. Coos relaxed deep breathing whenever you are sitting down. A good technique is to take a few relaxed deep breaths each time a commercial comes on if you are watching television.  If you were given a deep breathing device (such as an incentive spirometer) or a mucus clearance device, use this regularly as directed by your health care provider.  Try to cough several times a day as directed by your health care provider.  Perform any chest physiotherapy or postural drainage techniques as directed by your health care provider. If necessary, have someone (such as a family member) assist you with these techniques.  When lying down, lie on the unaffected side to encourage mucus drainage.  Stay physically active as much as possible. SEEK IMMEDIATE MEDICAL CARE IF:   You develop increasing problems with your breathing.   You develop severe chest pain.   You develop severe coughing, or you cough up blood.   You have a fever or persistent symptoms for more than 2-3 days.   You have a fever and your symptoms suddenly get worse.  MAKE SURE YOU:  Understand these instructions.  Will watch your condition.  Will get help right away if you are not doing well or get worse.   This information is not intended to replace advice given to you by your health care provider. Make sure you discuss any questions you have with your health care provider.   Document Released: 02/24/2005 Document Revised: 03/17/2014 Document  Reviewed: 09/01/2012 Elsevier Interactive Patient Education [employer].

## 2015-08-21 NOTE — Progress Notes (Signed)
.     Subjective: Patient feels well this morning, in good spirits, feels ready to go home. She denies any fevers or pain. She still has the "catching" sensation beneath her right lower ribs with deep inspiration, however this is improving. She is using the incentive spirometer. She reports good bowel movements yesterday.  Objective: Vital signs in last 24 hours: Filed Vitals:   08/20/15 0744 08/20/15 1640 08/20/15 2017 08/21/15 0443  BP: 131/78 135/80 124/68 117/73  Pulse: 82 82 79 81  Temp: 98.2 F (36.8 C) 98.3 F (36.8 C) 98.3 F (36.8 C) 98.4 F (36.9 C)  TempSrc: Oral Oral Oral Oral  Resp: 17 18 18 18   Height:      Weight:      SpO2: 94% 95% 94% 94%   Weight change:   Intake/Output Summary (Last 24 hours) at 08/21/15 0757 Last data filed at 08/21/15 0600  Gross per 24 hour  Intake    770 ml  Output    150 ml  Net    620 ml   General: resting in bed, no acute distress, pleasant HEENT: no scleral icterus Cardiac: RRR, no rubs, murmurs or gallops Pulm: distant breath sounds, otherwise clear to auscultation bilaterally Abd: soft, nontender, nondistended   Assessment/Plan: Principal Problem:   E coli bacteremia Active Problems:   Hypothyroidism   HLD (hyperlipidemia)   Hypertension associated with diabetes ([facility])   Status post laparoscopic cholecystectomy   Elevated transaminase level  E. Coli bacteremia: 2/2 blood cultures collected on 08/18/15. Started on IV Ceftriaxone on 08/19/15. Cultures are sensitive to cephalosporins, Pip/tazo, Cipro; resistant to Ampicillin, Ampicillin/sulbactam, and Trimeth/sulfa. Patient is afebrile with stable vital signs. No sign of sepsis or shock. -d/c ceftriaxone IV [6/11 >> 6/13]  -Start oral Cephalexin 500 mg BID for 10 day total course on discharge (6/14 >> 6/20)  Elevated LFTs s/p lap chole (08/15/15): Post-op day #6. LFTs are all trending down, Tbili has normalized. CXR showed likely lingular atelectasis. Abdominal U/S was without  biliary duct dilatation; CT abd/pelvis was without focal fluid collection or abscess; and HIDA scan was without bile leak. This was likely either due to post-op changes or retained duct stone which she has now passed on her own. -Advance diet as tolerated -Continue Incentive Spirometry every hour while awake -Oxy IR mg po q6h moderate pain  Hypertension: BPs stable. -Continue losartan 25 mg daily -Continue metoprolol 100 mg daily  Hypothyroidism: TSH 10.530 on 08/12/2015, elevated from 1.5 back in March 2017. Suspect there may be non-adherence as usual dosing is 1 microgram per kilogram. -Continue Synthroid 172.5 g for now though will have to assess adherence  Hyperlipidemia: Hold Lipitor 80 mg daily in setting of transaminitis -Resume on discharge  Coronary artery disease s/p PCI [BMS in 12/2010]:  -Continue aspirin 71m -prn sublingual nitro  Non-insulin-dependent type 2 diabetes: A1c 7.6, December 2016.  -Continue SSI-S  Depression: Continue sertraline 150 mg daily.  [facility]: [facility]   The patient does have a current PCP (Loleta Chance MD) and [facility].  The patient does not have transportation limitations that hinder transportation to clinic appointments.    LOS: 4 days   VZada Finders MD 08/21/2015, 7:57 AM

## 2015-08-21 NOTE — Telephone Encounter (Signed)
Needs TOC discharge 08/21/15 HFU 08/28/15

## 2015-08-28 ENCOUNTER — Ambulatory Visit (INDEPENDENT_AMBULATORY_CARE_PROVIDER_SITE_OTHER): Payer: Self-pay | Admitting: Internal Medicine

## 2015-08-28 ENCOUNTER — Encounter: Payer: Self-pay | Admitting: Internal Medicine

## 2015-08-28 ENCOUNTER — Encounter: Payer: Self-pay | Admitting: *Deleted

## 2015-08-28 VITALS — BP 125/71 | Temp 98.1°F | Ht 62.0 in | Wt 245.9 lb

## 2015-08-28 DIAGNOSIS — R7881 Bacteremia: Secondary | ICD-10-CM

## 2015-08-28 DIAGNOSIS — B9620 Unspecified Escherichia coli [E. coli] as the cause of diseases classified elsewhere: Secondary | ICD-10-CM

## 2015-08-28 DIAGNOSIS — R7401 Elevation of levels of liver transaminase levels: Secondary | ICD-10-CM

## 2015-08-28 DIAGNOSIS — N760 Acute vaginitis: Secondary | ICD-10-CM | POA: Insufficient documentation

## 2015-08-28 DIAGNOSIS — R740 Nonspecific elevation of levels of transaminase and lactic acid dehydrogenase [LDH]: Secondary | ICD-10-CM

## 2015-08-28 LAB — GLUCOSE, CAPILLARY: Glucose-Capillary: 125 mg/dL — ABNORMAL HIGH (ref 65–99)

## 2015-08-28 MED ORDER — FLUCONAZOLE 150 MG PO TABS: 150.0000 mg | ORAL_TABLET | ORAL | Status: DC

## 2015-08-28 NOTE — Assessment & Plan Note (Signed)
Had elevated LFT was thought to be 2/2 to retained bile duct stone which passed, which was improving spontaneously in the hospital.  Will check LFT again today.

## 2015-08-28 NOTE — Progress Notes (Signed)
   Subjective:    Patient ID: Kendra Adams, female    DOB: [birth year], 55 y.o.   MRN: [id number]  HPI  55 yo female with CAD, HTN, GERD, DM II, hypothyroidism, HLD, presents for hospital follow up of Ecoli bacteremia and elevated LFT's.   Was hospitalized 6/4 to 6/8 wth cholecystitis, s/p lap cholecystectomy. After discharge, within 48 hours she came back with fever, nausea, and found to have elevated LFTs (Alk phos 270, AST 287, ALT 241). Abdominal U/S was without biliary duct dilatation; CT abd/pelvis was without focal fluid collection or abscess; and HIDA scan was without bile leak. Surgery had seen patient and initially considered MRCP for further evaluation, however this was deferred as LFTs trended down and patient otherwise felt well. The working diagnosis for increased LFT was thought to be 2/2 to retained bile duct stone which passed spontaneously. Patient remained afebrile throughout the admission but was found to have Ecoli Bacteremia, was sent out with Cephalexin to complete a 10 day course.  She is doing fine. No fevers ,eating well, no nausea/vomiting. Today will finish her abx. Has appt with surgery tomorrow.   Having vaginal itching throughout the day. Started few days ago, feels it from the abx. Feels similar to her previous yeast infection which happened years ago. No dysuria or other urinary complaints.   On discharge,  Lab Results  Component Value Date   ALT 83* 08/21/2015   AST 29 08/21/2015   ALKPHOS 240* 08/21/2015   BILITOT 0.5 08/21/2015      Review of Systems  Constitutional: Negative for fever and chills.  HENT: Negative for congestion and sore throat.   Respiratory: Negative for chest tightness and shortness of breath.   Cardiovascular: Negative for chest pain, palpitations and leg swelling.  Genitourinary: Negative for dysuria, hematuria, vaginal bleeding, vaginal discharge and vaginal pain.       +vaginal itching  Neurological: Negative for dizziness and  numbness.       Objective:   Physical Exam  Constitutional: She is oriented to person, place, and time. She appears well-developed and well-nourished.  Obese female   HENT:  Head: Normocephalic and atraumatic.  Eyes: Conjunctivae are normal. Right eye exhibits no discharge. Left eye exhibits no discharge.  Cardiovascular: Normal rate and regular rhythm.  Exam reveals no gallop and no friction rub.   No murmur heard. Pulmonary/Chest: Effort normal and breath sounds normal. No respiratory distress. She has no wheezes.  Abdominal: Soft. Bowel sounds are normal.  Lap chole troch scar sites are well healing. No tenderness. Has some area of bruising on bilateral lower abdomen.   Musculoskeletal: Normal range of motion. She exhibits no edema or tenderness.  Neurological: She is alert and oriented to person, place, and time. No cranial nerve deficit. Coordination normal.     Filed Vitals:   08/28/15 1459 08/28/15 1500  BP:  125/71  Temp: 98.1 F (36.7 C)         Assessment & Plan:  See problem based a&p.

## 2015-08-28 NOTE — Patient Instructions (Signed)
I am glad you are doing well.  Please finish your abx.  Take diflucan for yeast infection one dose today and second dose 72 hours later. Call us if you are not feeling better.  Follow up with your new doctor in 1 month.

## 2015-08-28 NOTE — Assessment & Plan Note (Signed)
This started as she was taking her abx for ecoli bacteremia. No dysuria. This is likely 2/2 to yeast infection.  Will treat with fluconazole 140m x2 dose (72 hours apart).

## 2015-08-29 LAB — HEPATIC FUNCTION PANEL
ALT: 61 IU/L — ABNORMAL HIGH (ref 0–32)
AST: 24 IU/L (ref 0–40)
Albumin: 3.9 g/dL (ref 3.5–5.5)
Alkaline Phosphatase: 285 IU/L — ABNORMAL HIGH (ref 39–117)
Bilirubin Total: 0.3 mg/dL (ref 0.0–1.2)
Bilirubin, Direct: 0.17 mg/dL (ref 0.00–0.40)
Total Protein: 6.7 g/dL (ref 6.0–8.5)

## 2015-08-30 NOTE — Progress Notes (Signed)
Internal Medicine Clinic Attending  Case discussed with Dr. Ahmed at the time of the visit.  We reviewed the resident's history and exam and pertinent patient test results.  I agree with the assessment, diagnosis, and plan of care documented in the resident's note. 

## 2015-09-10 ENCOUNTER — Other Ambulatory Visit: Payer: Self-pay | Admitting: Internal Medicine

## 2015-09-21 ENCOUNTER — Ambulatory Visit (INDEPENDENT_AMBULATORY_CARE_PROVIDER_SITE_OTHER): Payer: Self-pay | Admitting: Internal Medicine

## 2015-09-21 ENCOUNTER — Ambulatory Visit (HOSPITAL_COMMUNITY)
Admission: RE | Admit: 2015-09-21 | Discharge: 2015-09-21 | Disposition: A | Discharge status: Home / Self Care | Payer: Self-pay | Source: Ambulatory Visit | Attending: Internal Medicine | Admitting: Internal Medicine

## 2015-09-21 ENCOUNTER — Encounter: Payer: Self-pay | Admitting: Internal Medicine

## 2015-09-21 VITALS — BP 107/65 | HR 92 | Temp 98.4°F | Wt 249.3 lb

## 2015-09-21 DIAGNOSIS — M151 Heberden's nodes (with arthropathy): Secondary | ICD-10-CM | POA: Insufficient documentation

## 2015-09-21 DIAGNOSIS — M25531 Pain in right wrist: Secondary | ICD-10-CM

## 2015-09-21 DIAGNOSIS — M19042 Primary osteoarthritis, left hand: Secondary | ICD-10-CM | POA: Insufficient documentation

## 2015-09-21 DIAGNOSIS — M12841 Other specific arthropathies, not elsewhere classified, right hand: Secondary | ICD-10-CM | POA: Insufficient documentation

## 2015-09-21 DIAGNOSIS — M791 Myalgia, unspecified site: Secondary | ICD-10-CM | POA: Insufficient documentation

## 2015-09-21 MED ORDER — ROPINIROLE HCL 1 MG PO TABS: 1.0000 mg | ORAL_TABLET | Freq: Every day | ORAL | Status: DC

## 2015-09-21 NOTE — Assessment & Plan Note (Addendum)
Patient presents with a two-week history of right wrist pain. She does not remember any trauma or mechanical cause of this pain. She says the pain lasts all day and is a burning pain. The pain does not radiate upward into her arm or down into her hand. She denies numbness, paresthesias or weakness. She ha not tried any pain medication for this pain. The pain is improved with rest and seems to worsen with repetitive use. She denies any fevers, headaches, chills, chest pain, shortness of breath, nausea, vomiting or abdominal pain. She denies any tick exposure and has no rashes. On physical examination today she was positive for the [facility] test. This test is most specific for de Quervain's tenosynovitis. Outside of this physical examination maneuver no pain was appreciated with flexion or extension of the wrist. The patient did complain of mild pain upon palpation of the lateral aspect of her radius where it meets the scaphoid. She had no anatomical snuffbox tenderness. There is no visible trauma, erythema or swelling of the wrist over the painful area. At this time we do not think imaging is necessary for this condition. However we will get imaging of her hand that was previously ordered for possible psoriatic arthritis and follow-up with these results as well.She has no red flag symptoms. We will treat symptomatically with rest. 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

## 2015-09-21 NOTE — Progress Notes (Signed)
CC: Right wrist pain HPI: [name] is a 55 y.o. female with a h/o of hypertension, diabetes, gastroesophageal reflux disease, coronary artery disease, morbid obesity and cholecystitis S/P cholecystectomy who presents with right wrist pain for 2 weeks in duration. Patient denies new headaches, changes in vision, chest pain, shortness of breath, nausea, vomiting or abdominal pain.    Past Medical History  Diagnosis Date  . Allergic rhinitis   . Tobacco abuse   . Depression   . GERD (gastroesophageal reflux disease)   . Hyperlipidemia   . Hypothyroidism   . CAD (coronary artery disease)     a. 12/13/10: mRCA 99%, EF 65-70%;  s/p BMS to mRCA  . HTN (hypertension)   . DM2 (diabetes mellitus, type 2) ([place])   . Hyperlipidemia    Current Outpatient Rx  Name  Route  Sig  Dispense  Refill  . albuterol (PROVENTIL HFA;VENTOLIN HFA) 108 (90 Base) MCG/ACT inhaler   Inhalation   Inhale 2 puffs into the lungs every 4 (four) hours as needed for wheezing or shortness of breath. Dispense with aerochamber   1 Inhaler   0   . aspirin 81 MG chewable tablet   Oral   Chew 81 mg by mouth daily.         Marland Kitchen atorvastatin (LIPITOR) 80 MG tablet   Oral   Take 1 tablet (80 mg total) by mouth daily.   90 tablet   3   . Blood Glucose Monitoring Suppl DEVI   Does not apply   1 Device by Does not apply route daily before breakfast.   1 each   0     Please cheapest generic brand available.   . cephALEXin (KEFLEX) 500 MG capsule   Oral   Take 1 capsule (500 mg total) by mouth every 12 (twelve) hours.   14 capsule   0   . fluconazole (DIFLUCAN) 150 MG tablet   Oral   Take 1 tablet (150 mg total) by mouth every 3 (three) days.   2 tablet   0   . glipiZIDE (GLUCOTROL) 5 MG tablet   Oral   Take 0.5 tablets (2.5 mg total) by mouth daily.   90 tablet   4   . Glucose Blood (BLOOD GLUCOSE TEST STRIPS) STRP   In Vitro   1 strip by In Vitro route daily before breakfast.   100 each  0     Cheapest generic available.   . Insulin Pen Needle (BD PEN NEEDLE NANO U/F) 32G X 4 MM MISC   Does not apply   1 Units/day by Does not apply route daily.   90 each   3   . ipratropium (ATROVENT) 0.06 % nasal spray   Each Nare   Place 2 sprays into both nostrils 4 (four) times daily.   15 mL   0   . Lancets MISC   Does not apply   1 Units by Does not apply route daily.   50 each   0   . levothyroxine (LEVOTHROID) 25 MCG tablet   Oral   Take 0.5 tablets (12.5 mcg total) by mouth daily. In addition to the 166mg tablet (total 162.559m daily)   30 tablet   11   . levothyroxine (SYNTHROID, LEVOTHROID) 150 MCG tablet      TAKE 1 TABLET BY MOUTH DAILY BEFORE BREAKFAST.   90 tablet   3     CYCLE FILL MEDICATION. Authorization is required f ...   .Marland Kitchen  loratadine (CLARITIN) 10 MG tablet   Oral   Take 1 tablet (10 mg total) by mouth daily.   30 tablet   11   . losartan (COZAAR) 25 MG tablet   Oral   Take 1 tablet (25 mg total) by mouth daily.   30 tablet   11   . metFORMIN (GLUCOPHAGE) 1000 MG tablet      TAKE 1 TABLET (1,000 MG TOTAL) BY MOUTH 2 (TWO) TIMES DAILY.   60 tablet   11   . metoprolol succinate (TOPROL-XL) 100 MG 24 hr tablet   Oral   Take 1 tablet (100 mg total) by mouth daily. Take with or immediately following a meal.   30 tablet   5     PATIENT NEEDS TO CALL AND SETUP APPT WITH DOCTOR B ...   . nitroGLYCERIN (NITROSTAT) 0.4 MG SL tablet   Sublingual   Place 1 tablet (0.4 mg total) under the tongue every 5 (five) minutes as needed for chest pain.   25 tablet   3   . Omega-3 Fatty Acids (FISH OIL) 1000 MG CAPS   Oral   Take 1 capsule by mouth daily.         Marland Kitchen omeprazole (PRILOSEC) 40 MG capsule      TAKE 1 CAPSULE BY MOUTH DAILY.   90 capsule   1     CYCLE FILL MEDICATION. Authorization is required f ...   . oxyCODONE (OXY IR/ROXICODONE) 5 MG immediate release tablet   Oral   Take 1 tablet (5 mg total) by mouth at bedtime as  needed for moderate pain.   2 tablet   0   . rOPINIRole (REQUIP) 1 MG tablet      TAKE ONE TABLET BY MOUTH AT BEDTIME   30 tablet   0   . sertraline (ZOLOFT) 100 MG tablet   Oral   Take 1.5 tablets (150 mg total) by mouth daily.   90 tablet   3     No refills available   . Spacer/Aero-Holding Chambers (AEROCHAMBER PLUS) inhaler      Use as instructed   1 each   2      Review of Systems: A complete ROS was negative except as per HPI.  Physical Exam: There were no vitals filed for this visit. General appearance: alert and cooperative Head: Normocephalic, without obvious abnormality, atraumatic Lungs: clear to auscultation bilaterally Heart: regular rate and rhythm, S1, S2 normal, no murmur, click, rub or gallop Abdomen: soft, non-tender; bowel sounds normal; no masses,  no organomegaly and no bruits, port scars are present and well healded Extremities: extremities normal, atraumatic, no cyanosis or edema Pain over palpation of the right radius where it meets the scaphoid, no pain elicited with flexion or extension of the wrist, positive Finkelstein test.  Assessment & Plan:  See encounters tab for problem based medical decision making. Patient seen with Dr. Angelia Mould  Signed: Ophelia Shoulder, MD 09/21/2015, 1:11 PM  Pager: [phone]

## 2015-09-21 NOTE — Progress Notes (Signed)
Internal Medicine Clinic Attending  I saw and evaluated the patient.  I personally confirmed the key portions of the history and exam documented by Dr. Lovena Le and I reviewed pertinent patient test results.  The assessment, diagnosis, and plan were formulated together and I agree with the documentation in the resident's note.

## 2015-09-21 NOTE — Patient Instructions (Signed)
It was a pleasure seeing you today. Thank you for choosing [facility] for your healthcare needs.  -- You are experiencing tendonitis  -- Please rest right wrist as much as possible -- Use brace if needed -- If it does not improve or worsens please return to clinic -- X-ray today

## 2015-09-25 ENCOUNTER — Telehealth: Payer: Self-pay | Admitting: Internal Medicine

## 2015-09-25 NOTE — Telephone Encounter (Signed)
I was able to reach the patient on the phone.We discussed her imaging findings. She is agreeable with the plan would like referral to rheumatology. I'll put this order in.

## 2015-09-25 NOTE — Telephone Encounter (Signed)
I have tried to call the patient several times to discuss the results of her recent imaging.I would like to refer her to rheumatology for further workup of her joint findings. I will try to call the patient again this afternoon to see if I can relay this information to her.

## 2015-09-25 NOTE — Addendum Note (Signed)
Addended by: Deirdre Evener on: 09/25/2015 09:53 AM   Modules accepted: Orders

## 2015-10-09 ENCOUNTER — Other Ambulatory Visit: Payer: Self-pay | Admitting: Internal Medicine

## 2015-10-15 ENCOUNTER — Encounter: Payer: Self-pay | Admitting: Internal Medicine

## 2015-10-15 ENCOUNTER — Ambulatory Visit (INDEPENDENT_AMBULATORY_CARE_PROVIDER_SITE_OTHER): Payer: Self-pay | Admitting: Internal Medicine

## 2015-10-15 VITALS — BP 133/62 | HR 88 | Temp 97.8°F | Wt 247.8 lb

## 2015-10-15 DIAGNOSIS — Z7984 Long term (current) use of oral hypoglycemic drugs: Secondary | ICD-10-CM

## 2015-10-15 DIAGNOSIS — L409 Psoriasis, unspecified: Secondary | ICD-10-CM | POA: Insufficient documentation

## 2015-10-15 DIAGNOSIS — E119 Type 2 diabetes mellitus without complications: Secondary | ICD-10-CM

## 2015-10-15 DIAGNOSIS — E118 Type 2 diabetes mellitus with unspecified complications: Secondary | ICD-10-CM

## 2015-10-15 DIAGNOSIS — F17200 Nicotine dependence, unspecified, uncomplicated: Secondary | ICD-10-CM

## 2015-10-15 DIAGNOSIS — F17210 Nicotine dependence, cigarettes, uncomplicated: Secondary | ICD-10-CM

## 2015-10-15 DIAGNOSIS — Z716 Tobacco abuse counseling: Secondary | ICD-10-CM

## 2015-10-15 LAB — GLUCOSE, CAPILLARY: Glucose-Capillary: 116 mg/dL — ABNORMAL HIGH (ref 65–99)

## 2015-10-15 LAB — POCT GLYCOSYLATED HEMOGLOBIN (HGB A1C): Hemoglobin A1C: 6.8

## 2015-10-15 MED ORDER — ROPINIROLE HCL 1 MG PO TABS: 1.0000 mg | ORAL_TABLET | Freq: Every day | ORAL | 3 refills | Status: DC

## 2015-10-15 MED ORDER — TRIAMCINOLONE ACETONIDE 0.1 % EX CREA: TOPICAL_CREAM | Freq: Two times a day (BID) | CUTANEOUS | Status: DC

## 2015-10-15 MED ORDER — SERTRALINE HCL 100 MG PO TABS: 150.0000 mg | ORAL_TABLET | Freq: Every day | ORAL | 2 refills | Status: DC

## 2015-10-15 MED ORDER — TRIAMCINOLONE ACETONIDE 0.1 % EX CREA: 1.0000 "application " | TOPICAL_CREAM | Freq: Two times a day (BID) | CUTANEOUS | 10 refills | Status: DC

## 2015-10-15 NOTE — Patient Instructions (Addendum)
Please continue to take your diabetes medications (Glipizide and metformin). There is no need for you to check your blood sugar at home at this time. Please use the triamcinolone cream daily for your psoriasis rash and follow up with rheumatology for your wrist pain. We will see you back in 3 months for a check up. Thank you!

## 2015-10-15 NOTE — Assessment & Plan Note (Addendum)
Red plaques with silvery scales on extensor surfaces and interdigit spaces. Also with signs of arthritis in her MCP joints with some ulnar deviation bilaterally. 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 Concern for psoriatic arthritis; referred to rheumatology for evaluation.  Prescribed triamcinolone cream 0.1% for rash.

## 2015-10-15 NOTE — Assessment & Plan Note (Signed)
Here for DM follow up. Well controlled today with A1C of 6.8. Foot exam normal and she has no complaints of neuropathy. Recent eye exam in the last year normal. Will continue home glipizide and metformin. F/u in 3 months.

## 2015-10-15 NOTE — Progress Notes (Signed)
   CC: DM follow up  HPI:   [name] is a 55 y.o. F here today for follow up of her diabetes. Patient is asymptomatic and has no complaints. She reports compliance with her home glipizide and metformin. She denies any symptoms of peripheral neuropathy or polyuria. She was seen by her ophthalmologist within the past year. She continues to complain of R wrist pain and has not yet been seen by rheumatology.   Past Medical History:  Diagnosis Date  . Allergic rhinitis   . CAD (coronary artery disease)    a. 12/13/10: mRCA 99%, EF 65-70%;  s/p BMS to mRCA  . Depression   . DM2 (diabetes mellitus, type 2) ([place])   . GERD (gastroesophageal reflux disease)   . HTN (hypertension)   . Hyperlipidemia   . Hyperlipidemia   . Hypothyroidism   . Tobacco abuse     Review of Systems:  All pertinents listed in the HPI, otherwise negative.   Physical Exam:  Vitals:   10/15/15 1527  BP: 133/62  Pulse: 88  Temp: 97.8 F (36.6 C)  TempSrc: Oral  SpO2: 96%  Weight: 247 lb 12.8 oz (112.4 kg)   Physical Exam Constitutional: NAD, appears comfortable HEENT: Atraumatic, normocephalic. PERRL, anicteric sclera.  Neck: Supple, trachea midline.  Cardiovascular: RRR, no murmurs, rubs, or gallops.  Pulmonary/Chest: CTAB, no wheezes, rales, or rhonchi. No chest wall abnormalities.  Abdominal: Soft, non tender, non distended. +BS.  Extremities: Warm and well perfused. PT and DP pulses 2+ bilaterally. No evidence of skin breakdown, lesions, or ulcerations on the feet.  Neurological: A&Ox3, CN II - XII grossly intact.  Skin: Red plaques with some silver scales on her RUE extensor surface and interdigit spaces bilaterally.  Psychiatric: Normal mood and affect  Assessment & Plan:   See Encounters Tab for problem based charting.  Patient seen with Dr. Evette Doffing

## 2015-10-15 NOTE — Assessment & Plan Note (Signed)
Given triamcinolone cream for daily use. Referred to rheumatology for possible psoriatic arthritis.

## 2015-10-15 NOTE — Assessment & Plan Note (Signed)
Recently started smoking again after her last hospitalization due to "stress". Currently smoking less than 1 PPD. Patient expressed a desire to quit again. Previously quit using chantix however is currently uninsured. Patient has agreed to try using patches OTC. Encouraged cessation and discussed the importance of quitting for her overall health.

## 2015-10-17 NOTE — Progress Notes (Signed)
Internal Medicine Clinic Attending  I saw and evaluated the patient.  I personally confirmed the key portions of the history and exam documented by Dr. Philipp Ovens and I reviewed pertinent patient test results.  The assessment, diagnosis, and plan were formulated together and I agree with the documentation in the resident's note.

## 2015-10-26 ENCOUNTER — Ambulatory Visit (INDEPENDENT_AMBULATORY_CARE_PROVIDER_SITE_OTHER): Payer: Self-pay | Admitting: Internal Medicine

## 2015-10-26 ENCOUNTER — Encounter: Payer: Self-pay | Admitting: Internal Medicine

## 2015-10-26 VITALS — BP 135/73 | HR 86 | Temp 98.0°F | Wt 251.8 lb

## 2015-10-26 DIAGNOSIS — M25531 Pain in right wrist: Secondary | ICD-10-CM | POA: Insufficient documentation

## 2015-10-26 DIAGNOSIS — K219 Gastro-esophageal reflux disease without esophagitis: Secondary | ICD-10-CM

## 2015-10-26 DIAGNOSIS — M11239 Other chondrocalcinosis, unspecified wrist: Principal | ICD-10-CM

## 2015-10-26 DIAGNOSIS — M19031 Primary osteoarthritis, right wrist: Secondary | ICD-10-CM

## 2015-10-26 DIAGNOSIS — M11839 Other specified crystal arthropathies, unspecified wrist: Secondary | ICD-10-CM | POA: Insufficient documentation

## 2015-10-26 MED ORDER — MELOXICAM 7.5 MG PO TABS: 7.5000 mg | ORAL_TABLET | Freq: Every day | ORAL | 2 refills | Status: DC

## 2015-10-26 NOTE — Assessment & Plan Note (Addendum)
Patient complains of a 2 month history of right wrist pain. 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 Patient was counseled on specifics of this disorder, including the lack of correlation between diet or calcium levels.  We discussed her treatment options and decided to try a course of Meloxicam to reduce the inflammation of the joint. This should be on the low-cost medication list at most pharmacies. She was instructed to take Meloxicam with food. Another option for the future is Voltaren gel, this is on the [place] low cost list. Steroid injections are also a possibility as well. We are working on a Rheumatology referral.  Patient to use heat or ice to assist in pain relief.  To return in 1 month for follow up.

## 2015-10-26 NOTE — Assessment & Plan Note (Signed)
Patient reports taking her Omeprazole after breakfast. She was advised to take Omeprazole 30 minutes prior to meals.

## 2015-10-26 NOTE — Progress Notes (Signed)
   CC: right wrist pain.   HPI:  Kendra Adams is a 55 y.o. female who presents for evaluation of a 2 month history of right wrist pain. The patient reports daily dull pain of her right wrist with frequent sharp twinges of pain. She also reports some swelling of the area as well. She reports exacerbation with thumb movement and finds some relief with wearing a wrist brace. She's tried taking Naproxen however it caused her heartburn to worsen and only provided minimal relief.  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   Past Medical History:  Diagnosis Date  . Allergic rhinitis   . CAD (coronary artery disease)    a. 12/13/10: mRCA 99%, EF 65-70%;  s/p BMS to mRCA  . Depression   . DM2 (diabetes mellitus, type 2) ([place])   . GERD (gastroesophageal reflux disease)   . HTN (hypertension)   . Hyperlipidemia   . Hyperlipidemia   . Hypothyroidism   . Tobacco abuse     Review of Systems:  Review of Systems  Constitutional: Negative for chills and fever.  Respiratory: Positive for cough. Negative for sputum production, shortness of breath and wheezing.   Cardiovascular: Negative for chest pain and leg swelling.  Gastrointestinal: Positive for heartburn. Negative for abdominal pain, blood in stool, diarrhea, nausea and vomiting.  Genitourinary: Negative for dysuria.  Musculoskeletal: Positive for back pain, joint pain and neck pain. Negative for myalgias.  Skin: Negative for rash.  Neurological: Negative for sensory change, weakness and headaches.     Physical Exam: Physical Exam  Constitutional: She is well-developed, well-nourished, and in no distress.  HENT:  Head: Normocephalic and atraumatic.  Cardiovascular: Normal rate and regular rhythm.   Pulmonary/Chest: Effort normal and breath sounds normal. She has no wheezes. She has no rales.  Abdominal: Soft. Bowel sounds  are normal. She exhibits no distension. There is no tenderness.  Musculoskeletal:       Right wrist: She exhibits decreased range of motion, tenderness and swelling. She exhibits no deformity.       Left wrist: Normal.    Vitals:   10/26/15 1507  BP: 135/73  Pulse: 86  Temp: 98 F (36.7 C)  TempSrc: Oral  SpO2: 98%  Weight: 251 lb 12.8 oz (114.2 kg)    Assessment & Plan:   See Encounters Tab for problem based charting.  Patient seen with Dr. Lynnae January

## 2015-10-26 NOTE — Patient Instructions (Signed)
It was a pleasure meeting you today!  1. Today we talked about your right wrist pain. An x-ray you had done recently showed some evidence for whats called Calcium Pyrophosphate Deposition Disease. This is a chronic condition in which your body deposits calcium into some joints. It is NOT related to your diet or your calcium levels. The treatment for this is NSAIDs. I have prescribed you a medication called Mobic (Meloxicam) to take. Please take this medication with food. If this medicine does not improve your pain please let us know and we will try out other options. 2. Today we also talked about how to properly take your Omeprazole. Please take this medicine on an empty stomach, 30 minutes before meals. This should improve how the medicine works and decrease the amount of heartburn you have.  3. We are working on your referral to rheumatology and will be in touch if we can get this scheduled. 4. Please follow up in 1 month!  Calcium Pyrophosphate Deposition  Calcium pyrophosphate deposition (CPPD), which is also called pseudogout, is a type of arthritis that causes pain, swelling, and inflammation in a joint. The joint pain can be severe and may last for days. If it is not treated, the pain may last much longer. Attacks of CPPD may come and go. This condition usually affects one joint at a time. The joints that are affected most commonly are the knees, but this condition can also affect the wrists, elbows, shoulders, or ankles. CPPD is similar to gout. Both conditions result from the buildup of crystals in the joint. However, CPPD is caused by a type of crystal that is different than the crystals that cause gout. CAUSES This condition is caused by the buildup of calcium pyrophosphate dihydrate crystals in the joint. The reason why this buildup occurs is not known. The condition may be passed down from parent to child (hereditary). RISK FACTORS This condition is more likely to develop in people  who:  Are over 40 years old.  Have a family history of the condition.  Have had joint replacement surgery.  Have had a recent injury.  Have certain medical conditions, such as hemophilia, ochronosis, amyloidosis, or hormonal disorders.  Have low blood magnesium levels. SYMPTOMS Symptoms of this condition include:  Pain in a joint. The pain may:  Be intense and constant.  Come on quickly.  Get worse with movement.  Last from several days to a few weeks.  Redness, swelling, and warmth at the joint.  Stiffness of the joint. DIAGNOSIS To diagnose this condition, your health care provider will use a needle to remove fluid from the joint. The fluid will be examined under a microscope to check for the crystals that cause CPPD. You may also have imaging tests, such as:  X-rays.  Ultrasound. TREATMENT There is no way to remove the crystals from the joint and no way to cure this condition. However, treatment can relieve symptoms and improve joint function. Treatment may include:  Nonsteroidal anti-inflammatory drugs (NSAIDs) to reduce inflammation and pain.  Medicines to help prevent attacks.  Injections of medicine (cortisone) into the joint to reduce pain and swelling.  Physical therapy to improve joint function. HOME CARE INSTRUCTIONS  Take medicines only as directed by your health care provider.  Rest the affected joints until your symptoms start to go away.  Keep your affected joints raised (elevated) when possible. This will help to reduce swelling.  If directed, apply ice to the affected area:  Put ice  in a plastic bag.  Place a towel between your skin and the bag.  Leave the ice on for 20 minutes, 2-3 times per day.  If the painful joint is in your leg, use crutches as directed by your health care provider.  When your symptoms start to go away, begin to exercise regularly or do physical therapy. Talk with your health care provider or physical therapist  about what types of exercise are safe for you. Low-impact exercise may be best. This includes walking, swimming, bicycling, and water aerobics.  Maintain a healthy weight so your joints do not need to bear more weight than necessary. SEEK MEDICAL CARE IF:  You have an increase in joint pain that is not relieved with medicine.  Your joint becomes more red, swollen, or stiff.  You have a fever.  You have a skin rash.   This information is not intended to replace advice given to you by your health care provider. Make sure you discuss any questions you have with your health care provider.   Document Released: 11/17/2003 Document Revised: 07/11/2014 Document Reviewed: 02/01/2014 Elsevier Interactive Patient Education [employer].

## 2015-10-29 NOTE — Progress Notes (Signed)
Internal Medicine Clinic Attending  I saw and evaluated the patient.  I personally confirmed the key portions of the history and exam documented by Dr. Danford Bad and I reviewed pertinent patient test results.  The assessment, diagnosis, and plan were formulated together and I agree with the documentation in the resident's note.

## 2015-11-03 ENCOUNTER — Other Ambulatory Visit: Payer: Self-pay | Admitting: Internal Medicine

## 2015-11-05 NOTE — Telephone Encounter (Signed)
Metformin 1000 mg BID prescription approved, sent to pharmacy.

## 2015-12-03 ENCOUNTER — Other Ambulatory Visit: Payer: Self-pay | Admitting: Internal Medicine

## 2015-12-03 DIAGNOSIS — E118 Type 2 diabetes mellitus with unspecified complications: Secondary | ICD-10-CM

## 2015-12-03 DIAGNOSIS — I10 Essential (primary) hypertension: Secondary | ICD-10-CM

## 2016-02-11 ENCOUNTER — Other Ambulatory Visit: Payer: Self-pay | Admitting: Internal Medicine

## 2016-02-11 DIAGNOSIS — I2510 Atherosclerotic heart disease of native coronary artery without angina pectoris: Secondary | ICD-10-CM

## 2016-02-22 ENCOUNTER — Telehealth: Payer: Self-pay | Admitting: Internal Medicine

## 2016-02-22 NOTE — Telephone Encounter (Signed)
APT. REMINDER CALL, LMTCB °

## 2016-02-25 ENCOUNTER — Encounter: Payer: Self-pay | Admitting: Internal Medicine

## 2016-02-25 ENCOUNTER — Ambulatory Visit (INDEPENDENT_AMBULATORY_CARE_PROVIDER_SITE_OTHER): Payer: Self-pay | Admitting: Internal Medicine

## 2016-02-25 VITALS — BP 124/81 | HR 78 | Temp 98.4°F | Ht 62.0 in | Wt 246.7 lb

## 2016-02-25 DIAGNOSIS — E118 Type 2 diabetes mellitus with unspecified complications: Secondary | ICD-10-CM

## 2016-02-25 DIAGNOSIS — F17210 Nicotine dependence, cigarettes, uncomplicated: Secondary | ICD-10-CM

## 2016-02-25 DIAGNOSIS — L409 Psoriasis, unspecified: Secondary | ICD-10-CM

## 2016-02-25 DIAGNOSIS — I152 Hypertension secondary to endocrine disorders: Secondary | ICD-10-CM

## 2016-02-25 DIAGNOSIS — M25531 Pain in right wrist: Secondary | ICD-10-CM

## 2016-02-25 DIAGNOSIS — Z79899 Other long term (current) drug therapy: Secondary | ICD-10-CM

## 2016-02-25 DIAGNOSIS — I10 Essential (primary) hypertension: Secondary | ICD-10-CM

## 2016-02-25 DIAGNOSIS — Z7984 Long term (current) use of oral hypoglycemic drugs: Secondary | ICD-10-CM

## 2016-02-25 DIAGNOSIS — E1159 Type 2 diabetes mellitus with other circulatory complications: Secondary | ICD-10-CM

## 2016-02-25 LAB — POCT GLYCOSYLATED HEMOGLOBIN (HGB A1C): Hemoglobin A1C: 7.9

## 2016-02-25 LAB — GLUCOSE, CAPILLARY: Glucose-Capillary: 132 mg/dL — ABNORMAL HIGH (ref 65–99)

## 2016-02-25 MED ORDER — TRIAMCINOLONE ACETONIDE 0.1 % EX CREA: 1.0000 "application " | TOPICAL_CREAM | Freq: Two times a day (BID) | CUTANEOUS | 3 refills | Status: DC

## 2016-02-25 MED ORDER — GLIPIZIDE 5 MG PO TABS: 5.0000 mg | ORAL_TABLET | Freq: Every day | ORAL | 4 refills | Status: DC

## 2016-02-25 NOTE — Assessment & Plan Note (Addendum)
Patient reports compliance with her glipizide and metformin, but only metformin was present in her medication bag. A1C today is up 6.8 -> 7.9. Glipizide was prescribed for her to take 1/2 of a 5 mg tablet (2.5 mg) a day. She reports she was taking the whole tablet. She did report one episode of dizziness but said it occurred after rising too quickly from a seated position. It resolved quickly on it's own and she has not had any further episodes. Educated patient today on the signs of hypoglycemia and provided her with a handout. Gave patient instructions to purchase a glucometer from [facility] for $10.99 with strips. Discussed the plan with her to restart the glipizide. Seeing as she was already taking 5 mg we will go back to this dose. Encouraged patient to watch for symptoms of hypoglycemia and to check her blood sugar at home if she experiences the dizziness again or any other signs of hypoglycemia. Patient is agreeable and voiced understanding.  -- Continue glipizide 5 mg daily, refills provided  -- Continue metformin 1000 mg BID

## 2016-02-25 NOTE — Patient Instructions (Signed)
We have increased the dose of your glipizide from 2.5 mg (1/2 of a tablet) to 5 mg (1 tablet). Please read over the handout below on hypoglycemia. If you experience any of these symptoms, please check your blood sugar with your meter. I have also refilled your triamcinolone cream. Since we were unable to get you an appointment with a rheumatologist, we will refer you to a dermatologist for your psoriasis instead. They will also be able to give you the treatment you need for your psoriatic arthritis. Please follow up in 3 months to have your A1C checked. If you have any questions or concerns, call our clinic at [phone] or after hours call [phone] and ask for the internal medicine resident on call. Thank you!  Hypoglycemia  Hypoglycemia is when the sugar (glucose) level in the blood is too low. Symptoms of low blood sugar may include:  Feeling:  Hungry.  Worried or nervous (anxious).  Sweaty and clammy.  Confused.  Dizzy.  Sleepy.  Sick to your stomach (nauseous).  Having:  A fast heartbeat.  A headache.  A change in your vision.  Jerky movements that you cannot control (seizure).  Nightmares.  Tingling or no feeling (numbness) around the mouth, lips, or tongue.  Having trouble with:  Talking.  Paying attention (concentrating).  Moving (coordination).  Sleeping.  Shaking.  Passing out (fainting).  Getting upset easily (irritability). Low blood sugar can happen to people who have diabetes and people who do not have diabetes. Low blood sugar can happen quickly, and it can be an emergency. Treating Low Blood Sugar  Low blood sugar is often treated by eating or drinking something sugary right away. If you can think clearly and swallow safely, follow the 15:15 rule:  Take 15 grams of a fast-acting carb (carbohydrate). Some fast-acting carbs are:  1 tube of glucose gel.  3 sugar tablets (glucose pills).  6-8 pieces of hard candy.  4 oz (120 mL) of fruit  juice.  4 oz (120 mL) of regular (not diet) soda.  Check your blood sugar 15 minutes after you take the carb.  If your blood sugar is still at or below 70 mg/dL (3.9 mmol/L), take 15 grams of a carb again.  If your blood sugar does not go above 70 mg/dL (3.9 mmol/L) after 3 tries, get help right away.  After your blood sugar goes back to normal, eat a meal or a snack within 1 hour. Treating Very Low Blood Sugar  If your blood sugar is at or below 54 mg/dL (3 mmol/L), you have very low blood sugar (severe hypoglycemia). This is an emergency. Do not wait to see if the symptoms will go away. Get medical help right away. Call your local emergency services (911 in the [place] Do not drive yourself to the hospital. If you have very low blood sugar and you cannot eat or drink, you may need a glucagon shot (injection). A family member or friend should learn how to check your blood sugar and how to give you a glucagon shot. Ask your doctor if you need to have a glucagon shot kit at home. Follow these instructions at home: General instructions  Avoid any diets that cause you to not eat enough food. Talk with your doctor before you start any new diet.  Take over-the-counter and prescription medicines only as told by your doctor.  Limit alcohol to no more than 1 drink per day for nonpregnant women and 2 drinks per day for men. One  drink equals 12 oz of beer, 5 oz of wine, or 1 oz of hard liquor.  Keep all follow-up visits as told by your doctor. This is important. If You Have Diabetes:   Make sure you know the symptoms of low blood sugar.  Always keep a source of sugar with you, such as:  Sugar.  Sugar tablets.  Glucose gel.  Fruit juice.  Regular soda (not diet soda).  Milk.  Hard candy.  Honey.  Take your medicines as told.  Follow your exercise and meal plan.  Eat on time. Do not skip meals.  Follow your sick day plan when you cannot eat or drink normally. Make this plan  ahead of time with your doctor.  Check your blood sugar as often as told by your doctor. Always check before and after exercise.  Share your diabetes care plan with:  Your work or school.  People you live with.  Check your pee (urine) for ketones:  When you are sick.  As told by your doctor.  Carry a card or wear jewelry that says you have diabetes. If You Have Low Blood Sugar From Other Causes:   Check your blood sugar as often as told by your doctor.  Follow instructions from your doctor about what you cannot eat or drink. Contact a doctor if:  You have trouble keeping your blood sugar in your target range.  You have low blood sugar often. Get help right away if:  You still have symptoms after you eat or drink something sugary.  Your blood sugar is at or below 54 mg/dL (3 mmol/L).  You have jerky movements that you cannot control.  You pass out. These symptoms may be an emergency. Do not wait to see if the symptoms will go away. Get medical help right away. Call your local emergency services (911 in the [place] Do not drive yourself to the hospital.  This information is not intended to replace advice given to you by your health care provider. Make sure you discuss any questions you have with your health care provider. Document Released: 05/21/2009 Document Revised: 08/02/2015 Document Reviewed: 03/30/2015 Elsevier Interactive Patient Education  2017 [employer].

## 2016-02-25 NOTE — Assessment & Plan Note (Signed)
Patient presents with diffuse scaly plaques all over her body. She has been using the triamcinolone cream twice a day but the tube runs out after about a week. She says it does provide relief but the tube doesn't last long as she is applying it to large areas of her body. She is also complaining of pain in her wright hand that is concerning for psoriatic arthritis. She does have arthritis changes of her MCP and DIP joints on exam with some mild ulnar deviation bilaterally. She has been referred to rheumatology on multiple occasions but has been unable to make an appointment, she says because she doesn't have insurance. On further question she also said the office never reached out to her. Given her extensive cutaneous involvement and concern now for psoriatic arthritis, I think she would benefit greatly from systemic treatment. Since she has been unsuccessful getting into the rheumatology clinic, we will refer her to dermatology for further management instead.  -- Refilled triamcinolone cream, increased tube size from 30g to 80g BID -- Dermatology referral

## 2016-02-25 NOTE — Assessment & Plan Note (Addendum)
BP today 125/81. Reports compliance. -- Continue losartan 25 mg daily  -- Continue metoprolol 100 mg q24 hours

## 2016-02-25 NOTE — Progress Notes (Signed)
   CC: DM follow up   HPI:  [name] is a 55 y.o. F here for follow up of her diabetes. She initially reported compliance with her glipizide and metformin but on review of her medication bag she did not have her glipizde with her. She said if it wasn't in the bag then she hasn't been taking it. Her prescription was written to take 1/2 of a 83m tablet, but she said she was taking a whole tablet. A1C today is up from last visit 6.8 -> 7.9. She was referred to rheumatology at her last visit for her right wrist pain concerning for psoriatic arthritis but has not made an appointment. She has been using her triamcinolone cream twice daily with minimal relief. Complains of diffuse psoriasis rash on her bilateral arms, legs, hands, feet, and buttocks.   Past Medical History:  Diagnosis Date  . Allergic rhinitis   . CAD (coronary artery disease)    a. 12/13/10: mRCA 99%, EF 65-70%;  s/p BMS to mRCA  . Depression   . DM2 (diabetes mellitus, type 2) ([place]   . GERD (gastroesophageal reflux disease)   . HTN (hypertension)   . Hyperlipidemia   . Hyperlipidemia   . Hypothyroidism   . Tobacco abuse     Review of Systems: All pertinents listed in the HPI, otherwise negative.   Physical Exam: Constitutional: Obese, NAD, appears comfortable Cardiovascular: RRR, no murmurs, rubs, or gallops.  Pulmonary/Chest: CTAB, no wheezes, rales, or rhonchi.  Abdominal: Soft, non tender, non distended. +BS.  Extremities: Warm and well perfused. Distal pulses intact. No edema.  Neurological: A&Ox3, CN II - XII grossly intact.  Skin: Red plaques with some silver scales on her RUE extensor surface and interdigit spaces bilaterally. Scaly plaques are noted throughout her scalp. Also with large plaques on her bilateral hips and buttocks.  Psychiatric: Normal mood and affect  Vitals:   02/25/16 1536  BP: 124/81  Pulse: 78  Temp: 98.4 F (36.9 C)  TempSrc: Oral  SpO2: 95%  Weight: 246 lb 11.2 oz (111.9 kg)    Height: 5' 2"  (1.575 m)    Assessment & Plan:   See Encounters Tab for problem based charting.  Patient seen with Dr. NDareen Piano

## 2016-02-26 NOTE — Progress Notes (Signed)
Internal Medicine Clinic Attending  I saw and evaluated the patient.  I personally confirmed the key portions of the history and exam documented by Dr. Philipp Ovens and I reviewed pertinent patient test results.  The assessment, diagnosis, and plan were formulated together and I agree with the documentation in the resident's note.

## 2016-03-12 ENCOUNTER — Other Ambulatory Visit: Payer: Self-pay | Admitting: Internal Medicine

## 2016-03-12 DIAGNOSIS — I10 Essential (primary) hypertension: Secondary | ICD-10-CM

## 2016-03-14 ENCOUNTER — Other Ambulatory Visit: Payer: Self-pay | Admitting: *Deleted

## 2016-03-14 MED ORDER — ROPINIROLE HCL 1 MG PO TABS: 1.0000 mg | ORAL_TABLET | Freq: Every day | ORAL | 2 refills | Status: DC

## 2016-04-16 ENCOUNTER — Other Ambulatory Visit: Payer: Self-pay | Admitting: Internal Medicine

## 2016-04-25 ENCOUNTER — Other Ambulatory Visit: Payer: Self-pay

## 2016-04-25 DIAGNOSIS — I2510 Atherosclerotic heart disease of native coronary artery without angina pectoris: Secondary | ICD-10-CM

## 2016-04-27 MED ORDER — NITROGLYCERIN 0.4 MG SL SUBL: 0.4000 mg | SUBLINGUAL_TABLET | SUBLINGUAL | 3 refills | Status: DC | PRN

## 2016-05-03 ENCOUNTER — Other Ambulatory Visit: Payer: Self-pay | Admitting: Internal Medicine

## 2016-05-24 ENCOUNTER — Other Ambulatory Visit: Payer: Self-pay | Admitting: Internal Medicine

## 2016-05-26 NOTE — Telephone Encounter (Signed)
I will approve 1 month but patient needs a f/u appointment. Thanks!

## 2016-05-31 ENCOUNTER — Other Ambulatory Visit: Payer: Self-pay | Admitting: Internal Medicine

## 2016-05-31 DIAGNOSIS — E118 Type 2 diabetes mellitus with unspecified complications: Secondary | ICD-10-CM

## 2016-05-31 DIAGNOSIS — I10 Essential (primary) hypertension: Secondary | ICD-10-CM

## 2016-06-12 ENCOUNTER — Other Ambulatory Visit: Payer: Self-pay | Admitting: *Deleted

## 2016-06-12 DIAGNOSIS — R053 Chronic cough: Secondary | ICD-10-CM

## 2016-06-12 DIAGNOSIS — R05 Cough: Secondary | ICD-10-CM

## 2016-06-12 MED ORDER — ALBUTEROL SULFATE HFA 108 (90 BASE) MCG/ACT IN AERS: 2.0000 | INHALATION_SPRAY | RESPIRATORY_TRACT | 1 refills | Status: DC | PRN

## 2016-06-13 ENCOUNTER — Encounter (HOSPITAL_COMMUNITY): Payer: Self-pay

## 2016-06-13 ENCOUNTER — Emergency Department (HOSPITAL_COMMUNITY): Payer: Self-pay

## 2016-06-13 DIAGNOSIS — J449 Chronic obstructive pulmonary disease, unspecified: Secondary | ICD-10-CM | POA: Insufficient documentation

## 2016-06-13 DIAGNOSIS — E119 Type 2 diabetes mellitus without complications: Secondary | ICD-10-CM | POA: Insufficient documentation

## 2016-06-13 DIAGNOSIS — I2510 Atherosclerotic heart disease of native coronary artery without angina pectoris: Secondary | ICD-10-CM | POA: Insufficient documentation

## 2016-06-13 DIAGNOSIS — E039 Hypothyroidism, unspecified: Secondary | ICD-10-CM | POA: Insufficient documentation

## 2016-06-13 DIAGNOSIS — Z955 Presence of coronary angioplasty implant and graft: Secondary | ICD-10-CM | POA: Insufficient documentation

## 2016-06-13 DIAGNOSIS — Z7982 Long term (current) use of aspirin: Secondary | ICD-10-CM | POA: Insufficient documentation

## 2016-06-13 DIAGNOSIS — J40 Bronchitis, not specified as acute or chronic: Secondary | ICD-10-CM | POA: Insufficient documentation

## 2016-06-13 DIAGNOSIS — F17210 Nicotine dependence, cigarettes, uncomplicated: Secondary | ICD-10-CM | POA: Insufficient documentation

## 2016-06-13 DIAGNOSIS — I10 Essential (primary) hypertension: Secondary | ICD-10-CM | POA: Insufficient documentation

## 2016-06-13 MED ORDER — ALBUTEROL SULFATE (2.5 MG/3ML) 0.083% IN NEBU
INHALATION_SOLUTION | RESPIRATORY_TRACT | Status: AC
  Administered 2016-06-13: 5 mg via RESPIRATORY_TRACT
  Filled 2016-06-13: qty 6

## 2016-06-13 MED ORDER — ALBUTEROL SULFATE (2.5 MG/3ML) 0.083% IN NEBU
5.0000 mg | INHALATION_SOLUTION | Freq: Once | RESPIRATORY_TRACT | Status: AC
  Administered 2016-06-13: 5 mg via RESPIRATORY_TRACT

## 2016-06-13 NOTE — ED Triage Notes (Signed)
Pt reports worsening SOB and productive cough x5 days. Pt denies cp. Pt reports using her rescue inhaler w/o relief. RR in triage 16. Pt in no acute respiratory distress. Pt believes s/s r/t seasonal allergies.

## 2016-06-14 ENCOUNTER — Emergency Department (HOSPITAL_COMMUNITY)
Admission: EM | Admit: 2016-06-14 | Discharge: 2016-06-14 | Disposition: A | Discharge status: Home / Self Care | Payer: Self-pay | Attending: Emergency Medicine | Admitting: Emergency Medicine

## 2016-06-14 DIAGNOSIS — J40 Bronchitis, not specified as acute or chronic: Secondary | ICD-10-CM

## 2016-06-14 HISTORY — DX: Other seasonal allergic rhinitis: J30.2

## 2016-06-14 HISTORY — DX: Chronic obstructive pulmonary disease, unspecified: J44.9

## 2016-06-14 HISTORY — DX: Unspecified asthma, uncomplicated: J45.909

## 2016-06-14 MED ORDER — IPRATROPIUM BROMIDE 0.02 % IN SOLN
0.5000 mg | Freq: Once | RESPIRATORY_TRACT | Status: AC
  Administered 2016-06-14: 0.5 mg via RESPIRATORY_TRACT
  Filled 2016-06-14: qty 2.5

## 2016-06-14 MED ORDER — ALBUTEROL (5 MG/ML) CONTINUOUS INHALATION SOLN
10.0000 mg/h | INHALATION_SOLUTION | Freq: Once | RESPIRATORY_TRACT | Status: AC
  Administered 2016-06-14: 10 mg/h via RESPIRATORY_TRACT
  Filled 2016-06-14: qty 20

## 2016-06-14 MED ORDER — PREDNISONE 20 MG PO TABS
60.0000 mg | ORAL_TABLET | Freq: Once | ORAL | Status: AC
  Administered 2016-06-14: 60 mg via ORAL
  Filled 2016-06-14: qty 3

## 2016-06-14 MED ORDER — PREDNISONE 10 MG (21) PO TBPK: ORAL_TABLET | Freq: Every day | ORAL | 0 refills | Status: DC

## 2016-06-14 NOTE — ED Notes (Signed)
Reports relief of tightness/SOB, lung sounds are clearer than first time.  Sats stable in upper 90s w/out oxygen.

## 2016-06-14 NOTE — ED Provider Notes (Signed)
[facility] DEPT Provider Note   CSN: [id number] Arrival date & time: 06/13/16  2322  By signing my name below, I, Kendra Adams, attest that this documentation has been prepared under the direction and in the presence of Lacretia Leigh, MD. Electronically Signed: Oleh Adams, Scribe. 06/14/16. 12:59 AM.   History   Chief Complaint Chief Complaint  Patient presents with  . Shortness of Breath    HPI Kendra Adams is a 56 y.o. female with history of CAD, MI, HTN, HLD, GERD, hypothyroidism and IDDM who presents to the ED for evaluation of cough and dyspnea. This patient states that in the last 2 weeks she has experienced a productive cough with dyspnea that has gradually worsened. Today also experiencing subjective fevers and diarrhea. She is typically on inhalers but has ran out of this therapy. She is taking robotussin at home without improvement. She did receive an albuterol treatment in triage which did help her symptoms. She is a tobacco user.  The history is provided by the patient. No language interpreter was used.    Past Medical History:  Diagnosis Date  . Allergic rhinitis   . Asthma   . CAD (coronary artery disease)    a. 12/13/10: mRCA 99%, EF 65-70%;  s/p BMS to mRCA  . COPD (chronic obstructive pulmonary disease) ([place])   . Depression   . DM2 (diabetes mellitus, type 2) ([place])   . GERD (gastroesophageal reflux disease)   . HTN (hypertension)   . Hyperlipidemia   . Hyperlipidemia   . Hypothyroidism   . Seasonal allergies   . Tobacco abuse     Patient Active Problem List   Diagnosis Date Noted  . 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 Right wrist pain 10/26/2015  . Psoriasis 10/15/2015  . Tobacco abuse counseling 10/15/2015  . Tendon pain 09/21/2015  . Vaginitis 08/28/2015  . E coli bacteremia 08/19/2015  . Pain in the chest   . Chest pain 08/13/2015  . Status post laparoscopic cholecystectomy 08/12/2015  . Elevated  transaminase level 08/12/2015  . Positive fecal occult blood test 07/10/2015  . Injury of toe on left foot 07/05/2015  . Chronic cough 05/16/2015  . Tendinitis of left rotator cuff 01/19/2015  . Tobacco use disorder 09/07/2014  . Morbid obesity ([place]) 03/19/2011  . Routine health maintenance 02/03/2011  . CAD (coronary artery disease) 12/26/2010  . Diabetes mellitus type 2 with complications ([place]) 73/71/0626  . Hypertension associated with diabetes ([place]) 05/10/2008  . Hypothyroidism 04/27/2008  . HLD (hyperlipidemia) 04/27/2008  . Depression 04/27/2008  . GERD 04/27/2008    Past Surgical History:  Procedure Laterality Date  . CARDIAC CATHETERIZATION N/A 08/14/2015   Procedure: Left Heart Cath and Coronary Angiography;  Surgeon: Peter M Martinique, MD;  Location: [facility];  Service: Cardiovascular;  Laterality: N/A;  . CARPAL TUNNEL RELEASE     w/ bone spurs  . CHOLECYSTECTOMY N/A 08/15/2015   Procedure: LAPAROSCOPIC CHOLECYSTECTOMY WITH ATTEMPTED INTRAOPERATIVE CHOLANGIOGRAM;  Surgeon: Erroll Luna, MD;  Location: [facility];  Service: General;  Laterality: N/A;  . CORONARY ANGIOPLASTY WITH STENT PLACEMENT    . DILATION AND CURETTAGE OF UTERUS      OB History    No data available       Home Medications    Prior to Admission medications   Medication Sig Start Date End Date Taking? Authorizing Provider  albuterol (PROVENTIL HFA;VENTOLIN HFA) 108 (90 Base) MCG/ACT inhaler Inhale 2 puffs into the lungs every 4 (  four) hours as needed for wheezing or shortness of breath. Dispense with aerochamber 06/12/16   [name], MD  aspirin 81 MG chewable tablet Chew 81 mg by mouth daily.    Historical Provider, MD  atorvastatin (LIPITOR) 80 MG tablet TAKE ONE TABLET BY MOUTH ONCE DAILY 06/02/16   [name], MD  Blood Glucose Monitoring Suppl DEVI 1 Device by Does not apply route daily before breakfast. 05/23/14   Ejiroghene E Emokpae, MD  glipiZIDE (GLUCOTROL) 5 MG tablet Take 1  tablet (5 mg total) by mouth daily. 02/25/16 02/24/17  [name], MD  Glucose Blood (BLOOD GLUCOSE TEST STRIPS) STRP 1 strip by In Vitro route daily before breakfast. 05/23/14   Ejiroghene E Emokpae, MD  Insulin Pen Needle (BD PEN NEEDLE NANO U/F) 32G X 4 MM MISC 1 Units/day by Does not apply route daily. 02/26/15   Loleta Chance, MD  ipratropium (ATROVENT) 0.06 % nasal spray Place 2 sprays into both nostrils 4 (four) times daily. 10/03/14   Melony Overly, MD  Lancets MISC 1 Units by Does not apply route daily. 05/23/14   Ejiroghene Arlyce Dice, MD  levothyroxine (LEVOTHROID) 25 MCG tablet Take 0.5 tablets (12.5 mcg total) by mouth daily. In addition to the 15mg tablet (total 162.519m daily) 02/28/15 02/28/16  KyLoleta ChanceMD  levothyroxine (SYNTHROID, LEVOTHROID) 150 MCG tablet TAKE ONE TABLET BY MOUTH ONCE DAILY BEFORE  BREAKFAST 05/29/16   CaVelna OchsMD  losartan (COZAAR) 25 MG tablet TAKE ONE TABLET BY MOUTH ONCE DAILY 03/13/16   CaVelna OchsMD  metFORMIN (GLUCOPHAGE) 1000 MG tablet TAKE ONE TABLET BY MOUTH TWICE DAILY 05/26/16   CaVelna OchsMD  metoprolol succinate (TOPROL-XL) 100 MG 24 hr tablet TAKE ONE TABLET BY MOUTH ONCE DAILY WITH  OR  IMMEDIATELY  FOLLOWING  A  MEAL 02/12/16   CaVelna OchsMD  nitroGLYCERIN (NITROSTAT) 0.4 MG SL tablet Place 1 tablet (0.4 mg total) under the tongue every 5 (five) minutes as needed for chest pain. 04/27/16   CaVelna OchsMD  Omega-3 Fatty Acids (FISH OIL) 1000 MG CAPS Take 1 capsule by mouth daily.    Historical Provider, MD  omeprazole (PRILOSEC) 40 MG capsule TAKE ONE CAPSULE BY MOUTH ONCE DAILY 02/12/16   CaVelna OchsMD  rOPINIRole (REQUIP) 1 MG tablet TAKE ONE TABLET BY MOUTH AT BEDTIME 06/02/16   CaVelna OchsMD  sertraline (ZOLOFT) 100 MG tablet Take 1.5 tablets (150 mg total) by mouth daily. 10/15/15   CaVelna OchsMD  Spacer/Aero-Holding Chambers (AEROCHAMBER PLUS) inhaler Use as instructed 02/03/15   AsMelynda RippleMD  triamcinolone cream (KENALOG) 0.1 % Apply 1 application topically 2 (two) times daily. 02/25/16   CaVelna OchsMD    Family History Family History  Problem Relation Age of Onset  . Diabetes Father   . Hypertension Father   . Diabetes Mother   . Hypertension Mother     Social History Social History  Substance Use Topics  . Smoking status: Current Every Day Smoker    Packs/day: 0.50    Years: 40.00    Types: Cigarettes    Last attempt to quit: 05/19/2014  . Smokeless tobacco: Current User     Comment: Trying to quit again  . Alcohol use No     Allergies   Gabapentin; Nsaids; and Tramadol hcl   Review of Systems Review of Systems  Constitutional: Positive for fever (subjective).  Respiratory: Positive for cough and shortness of breath.   Gastrointestinal: Positive  for diarrhea.     Physical Exam Updated Vital Signs BP 112/76 (BP Location: Left Arm)   Pulse 90   Temp 98.4 F (36.9 C) (Oral)   Resp 20   SpO2 95%   Physical Exam  Constitutional: She is oriented to person, place, and time. She appears well-developed and well-nourished.  Non-toxic appearance. No distress.  HENT:  Head: Normocephalic and atraumatic.  Eyes: Conjunctivae, EOM and lids are normal. Pupils are equal, round, and reactive to light.  Neck: Normal range of motion. Neck supple. No tracheal deviation present. No thyroid mass present.  Cardiovascular: Normal rate, regular rhythm and normal heart sounds.  Exam reveals no gallop.   No murmur heard. Pulmonary/Chest: Effort normal. No stridor. No respiratory distress. She has no decreased breath sounds. She has no rhonchi. She has no rales.  Expiratory wheezing bilaterally.  Abdominal: Soft. Normal appearance and bowel sounds are normal. She exhibits no distension. There is no tenderness. There is no rebound and no CVA tenderness.  Musculoskeletal: Normal range of motion. She exhibits no edema or tenderness.  Neurological: She is  alert and oriented to person, place, and time. She has normal strength. No cranial nerve deficit or sensory deficit. GCS eye subscore is 4. GCS verbal subscore is 5. GCS motor subscore is 6.  Skin: Skin is warm and dry. No abrasion and no rash noted.  Psychiatric: She has a normal mood and affect. Her speech is normal and behavior is normal.  Nursing note and vitals reviewed.    ED Treatments / Results  Labs (all labs ordered are listed, but only abnormal results are displayed) Labs Reviewed - No data to display  EKG  EKG Interpretation  Date/Time:  Friday June 13 2016 23:33:40 EDT Ventricular Rate:  89 PR Interval:    QRS Duration: 103 QT Interval:  358 QTC Calculation: 436 R Axis:   57 Text Interpretation:  Sinus rhythm Borderline T wave abnormalities Baseline wander in lead(s) V6 No significant change since last tracing Confirmed by Zenia Resides  MD, Seena Ritacco ([id number]) on 06/14/2016 12:47:27 AM       Radiology Dg Chest 2 View  Result Date: 06/13/2016 CLINICAL DATA:  Shortness of breath and cough EXAM: CHEST  2 VIEW COMPARISON:  08/17/2015 FINDINGS: The heart size and mediastinal contours are within normal limits. Both lungs are clear. Aortic atherosclerosis. Degenerative changes of the spine with advanced arthritis of both shoulders. IMPRESSION: No active cardiopulmonary disease. Electronically Signed   By: Donavan Foil M.D.   On: 06/13/2016 23:42    Procedures Procedures (including critical care time)  Medications Ordered in ED Medications  albuterol (PROVENTIL,VENTOLIN) solution continuous neb (not administered)  predniSONE (DELTASONE) tablet 60 mg (not administered)  ipratropium (ATROVENT) nebulizer solution 0.5 mg (not administered)  albuterol (PROVENTIL) (2.5 MG/3ML) 0.083% nebulizer solution 5 mg (5 mg Nebulization Given 06/13/16 2346)     Initial Impression / Assessment and Plan / ED Course  I have reviewed the triage vital signs and the nursing notes.  Pertinent labs &  imaging results that were available during my care of the patient were reviewed by me and considered in my medical decision making (see chart for details).     Patient treated with albuterol and prednisone here and feels better. Suspect bronchospasm. She has inhaler which she will continue to take her will place on prednisone taper.  Final Clinical Impressions(s) / ED Diagnoses   Final diagnoses:  None    New Prescriptions New Prescriptions   No medications  on file  I personally performed the services described in this documentation, which was scribed in my presence. The recorded information has been reviewed and is accurate.      Lacretia Leigh, MD 06/14/16 [phone]

## 2016-06-23 ENCOUNTER — Encounter: Payer: Self-pay | Admitting: Internal Medicine

## 2016-06-23 ENCOUNTER — Telehealth: Payer: Self-pay | Admitting: Dietician

## 2016-06-23 ENCOUNTER — Ambulatory Visit (INDEPENDENT_AMBULATORY_CARE_PROVIDER_SITE_OTHER): Payer: Self-pay | Admitting: Internal Medicine

## 2016-06-23 VITALS — BP 119/65 | HR 75 | Temp 97.7°F | Wt 249.9 lb

## 2016-06-23 DIAGNOSIS — E039 Hypothyroidism, unspecified: Secondary | ICD-10-CM

## 2016-06-23 DIAGNOSIS — Z7984 Long term (current) use of oral hypoglycemic drugs: Secondary | ICD-10-CM

## 2016-06-23 DIAGNOSIS — B9689 Other specified bacterial agents as the cause of diseases classified elsewhere: Secondary | ICD-10-CM

## 2016-06-23 DIAGNOSIS — E038 Other specified hypothyroidism: Secondary | ICD-10-CM

## 2016-06-23 DIAGNOSIS — J019 Acute sinusitis, unspecified: Secondary | ICD-10-CM

## 2016-06-23 DIAGNOSIS — E119 Type 2 diabetes mellitus without complications: Secondary | ICD-10-CM

## 2016-06-23 DIAGNOSIS — Z79899 Other long term (current) drug therapy: Secondary | ICD-10-CM

## 2016-06-23 DIAGNOSIS — E118 Type 2 diabetes mellitus with unspecified complications: Secondary | ICD-10-CM

## 2016-06-23 DIAGNOSIS — F17210 Nicotine dependence, cigarettes, uncomplicated: Secondary | ICD-10-CM

## 2016-06-23 LAB — POCT GLYCOSYLATED HEMOGLOBIN (HGB A1C): Hemoglobin A1C: 6.7

## 2016-06-23 LAB — GLUCOSE, CAPILLARY: Glucose-Capillary: 187 mg/dL — ABNORMAL HIGH (ref 65–99)

## 2016-06-23 MED ORDER — AMOXICILLIN-POT CLAVULANATE 875-125 MG PO TABS: 1.0000 | ORAL_TABLET | Freq: Two times a day (BID) | ORAL | 0 refills | Status: AC

## 2016-06-23 MED ORDER — FLUTICASONE PROPIONATE 50 MCG/ACT NA SUSP: 2.0000 | Freq: Every day | NASAL | 2 refills | Status: DC

## 2016-06-23 MED ORDER — LEVOTHYROXINE SODIUM 25 MCG PO TABS: 12.5000 ug | ORAL_TABLET | Freq: Every day | ORAL | 2 refills | Status: DC

## 2016-06-23 NOTE — Telephone Encounter (Signed)
Steroid-Induced Hyperglycemia Prevention and Management Kendra Adams is a 56 y.o. female who meets criteria for [facility] quality improvement program (diabetes patient prescribed short course of steroids).  A/P Current Regimen  Patient prescribed prednisone daily x 10 days, finished them yesterday.   Prednisone indication: infection  Current DM regimen - glucotrol & glucophage  Home BG Monitoring  Patient does have a meter at home and does check BG at home. Meter was not supplied.  CBGs at home:160,  157   A1C today after steroid course 6.7, glucose 187  S/Sx of hyper- or hypoglycemia: none, none  Medication Management  None needed  Patient Education  Advised patient to monitor BG when on steroid therapy (at least twice daily prior to first 2 meals of the day).   Patient did  verbalize understanding of information and regimen by repeating back topics discussed.  Follow-up as needed  Blue Winther, [name] 4:37 PM 06/23/2016

## 2016-06-23 NOTE — Patient Instructions (Addendum)
Kendra Adams,  It was a pleasure seeing you today. I have sent a prescription for your synthroid and an antibiotic to your pharmacy. In addition to the antibiotic, I want you to start using Flonase nasal spray 2 puffs each nostril daily. It is also important that you irrigate your sinuses regularly. Please see the instructions below. I will call you with the results of your blood work today. Please follow up in 3 months. Thank you!  - Dr. Philipp Ovens   BUFFERED ISOTONIC SALINE NASAL IRRIGATION  The Benefits:  1. When you irrigate, the isotonic saline (salt water) acts as a solvent and washes the mucus crusts and other debris from your nose.  2. This decongests and improves the airflow into your nose. The sinus passages begin to open.  3. Studies have also shown that a salt water and an alkaline (baking soda) irrigation solution improves nasal membrane cell function (mucociliary flow of mucus debris).  The Recipe:  1. Choose a 1-quart glass jar that is thoroughly cleansed.  2. Fill with sterile or distilled water, or you can boil water from the tap.  3. Add 1 to 2 heaping teaspoons of "pickling/canning/sea" salt (NOT table salt as it contains a large number of additives). This salt is available at the grocery store in the food canning section.  4. Add 1 teaspoon of Arm [employer] Hammer Baking Soda (pure bicarbonate).  5. Mix ingredients together and store at room temperature. Discard after one week. If you find this solution too strong, you may decrease the amount of salt added to 1 to 1  teaspoons. With children it is often best to start with a milder solution and advance slowly. Irrigate with 240 ml (8 oz) twice daily.  The Instructions:  You should plan to irrigate your nose with buffered isotonic saline 2 times per day. Many people prefer to warm the solution slightly in the microwave - but be sure that the solution is NOT HOT. Stand over the sink (some do this in the shower) and squirt the  solution into each side of your nose, keeping your mouth open. This allows you to spit the saltwater out of your mouth. It will not harm you if you swallow a little.  If you have been told to use a nasal steroid such as Flonase, Nasonex, or Nasacort, you should always use isortonic saline solution first, then use your nasal steroid product. The nasal steroid is much more effective when sprayed onto clean nasal membranes and the steroid medicine will reach deeper into the nose.  Most people experience a little burning sensation the first few times they use a isotonic saline solution, but this usually goes away within a few days.      Sinus Rinse What is a sinus rinse? A sinus rinse is a home treatment. It rinses your sinuses with a mixture of salt and water (saline solution). Sinuses are air-filled spaces in your skull behind the bones of your face and forehead. They open into your nasal cavity. To do a sinus rinse, you will need:  Saline solution.  Neti pot or spray bottle. This releases the saline solution into your nose and through your sinuses. You can buy neti pots and spray bottles at:  Your local pharmacy.  A health food store.  Online. When should I do a sinus rinse? A sinus rinse can help to clear your nasal cavity. It can clear:  Mucus.  Dirt.  Dust.  Pollen. You may do a sinus rinse  when you have:  A cold.  A virus.  Allergies.  A sinus infection.  A stuffy nose. If you are considering a sinus rinse:  Ask your child's doctor before doing a sinus rinse on your child.  Do not do a sinus rinse if you have had:  Ear or nasal surgery.  An ear infection.  Blocked ears. How do I do a sinus rinse?  Wash your hands.  Disinfect your device using the directions that came with the device.  Dry your device.  Use the solution that comes with your device or one that is sold separately in stores. Follow the mixing directions on the package.  Fill your device  with the amount of saline solution as stated in the device instructions.  Stand over a sink and tilt your head sideways over the sink.  Place the spout of the device in your upper nostril (the one closer to the ceiling).  Gently pour or squeeze the saline solution into the nasal cavity. The liquid should drain to the lower nostril if you are not too congested.  Gently blow your nose. Blowing too hard may cause ear pain.  Repeat in the other nostril.  Clean and rinse your device with clean water.  Air-dry your device. Are there risks of a sinus rinse? Sinus rinse is normally very safe and helpful. However, there are a few risks, which include:  A burning feeling in the sinuses. This may happen if you do not make the saline solution as instructed. Make sure to follow all directions when making the saline solution.  Infection from unclean water. This is rare, but possible.  Nasal irritation. This information is not intended to replace advice given to you by your health care provider. Make sure you discuss any questions you have with your health care provider. Document Released: 09/21/2013 Document Revised: 01/22/2016 Document Reviewed: 07/12/2013 Elsevier Interactive Patient Education  2017 [employer].

## 2016-06-24 LAB — TSH: TSH: 1.28 u[IU]/mL (ref 0.450–4.500)

## 2016-06-25 NOTE — Progress Notes (Signed)
Internal Medicine Clinic Attending  Case discussed with Dr. Guilloud at the time of the visit.  We reviewed the resident's history and exam and pertinent patient test results.  I agree with the assessment, diagnosis, and plan of care documented in the resident's note.  

## 2016-06-25 NOTE — Assessment & Plan Note (Addendum)
Patient is here today with complaints of URI (congestion, rhinorrhea, headache, and hoarse voice) x 3 weeks. She was seen in the ED 2 weeks ago for the same complaint and was noted to be wheezing on exam. She was treated with albuterol and prednisone. She has been using claritin, mucinex, and atrovent nasal spray daily at home without relief. She has not checked her temperature but endorses subjective fevers. She is requesting an antibiotic. I feel that this is reasonable given the duration of her symptoms and failure to improve despite over the counter therapy. I also discussed with her proper techniques for nasal irrigation with neti pot. Encouraged her to irrigate her sinuses daily followed by flonase, 2 sprays each nostril daily.  -- Augmentin BID x 5 days -- Flonase 2 sprays each nostril daily, after sinus irrigation -- Patient was provided a handout on tips for sinus irrigation  -- Continue daily Claritin

## 2016-06-25 NOTE — Assessment & Plan Note (Signed)
Patient was started on glipizide 5 mg daily at her last visit in December. She is also taking metformin 1000 mg BID. She is tolerating these medications well without side effects. A1C has improved today 7.9 -> 6.7. Goal A1C < 7.0. Continue current management.  -- Continue Glipizide 5 mg daily  -- Continue Metformin 1000 mg BID  -- F/u 3 months

## 2016-06-25 NOTE — Assessment & Plan Note (Addendum)
Patient is prescribed synthroid 162.5 ug daily. She takes one 150 ug tablet and 1/2 of a 25 mg daily, unfortunately has run out of her 25 mg tablets and has only been taking 150 ug daily for the past few days. Her last TSH checked in June of 2017 was 10.53. She denies any symptoms of hypothyroidism.  -- Refilled 25 ug tablets -- Continue with 162.5 ug daily for now -- Check TSH today   UPDATE: TSH today is 1.28. Continue with current dose, recheck at next appointment.

## 2016-06-25 NOTE — Progress Notes (Signed)
   CC: Sinusitis   HPI:  Kendra Adams is a 56 y.o. female with past medical history outlined below here with complaint of sinusitis. For the details of today's visit, please refer to the assessment and plan.  Past Medical History:  Diagnosis Date  . Allergic rhinitis   . Asthma   . CAD (coronary artery disease)    a. 12/13/10: mRCA 99%, EF 65-70%;  s/p BMS to mRCA  . COPD (chronic obstructive pulmonary disease) ([place])   . Depression   . DM2 (diabetes mellitus, type 2) ([place])   . GERD (gastroesophageal reflux disease)   . HTN (hypertension)   . Hyperlipidemia   . Hyperlipidemia   . Hypothyroidism   . Seasonal allergies   . Tobacco abuse     Review of Systems:  All pertinents listed in HPI, otherwise negative  Physical Exam:  Vitals:   06/23/16 1420  BP: 119/65  Pulse: 75  Temp: 97.7 F (36.5 C)  TempSrc: Oral  SpO2: 98%  Weight: 249 lb 14.4 oz (113.4 kg)    Constitutional: NAD, appears comfortable HEENT: Atraumatic, normocephalic. PERRL, anicteric sclera. Posterior oropharynx is erythematous with post nasal drainage, no evidence of tonsillitis. Maxillary sinuses are painful to palpation.  Neck: Supple, trachea midline. No lymphadenopathy.  Cardiovascular: RRR, no murmurs, rubs, or gallops.  Pulmonary/Chest: CTAB, no wheezes, rales, or rhonchi.  Extremities: Warm and well perfused. No edema.  Skin: No rashes or erythema    Assessment & Plan:   See Encounters Tab for problem based charting.  Patient discussed with Dr. Evette Doffing

## 2016-06-27 ENCOUNTER — Other Ambulatory Visit: Payer: Self-pay | Admitting: Internal Medicine

## 2016-06-30 ENCOUNTER — Other Ambulatory Visit: Payer: Self-pay | Admitting: *Deleted

## 2016-07-01 MED ORDER — SERTRALINE HCL 100 MG PO TABS: 150.0000 mg | ORAL_TABLET | Freq: Every day | ORAL | 2 refills | Status: DC

## 2016-07-06 ENCOUNTER — Emergency Department (HOSPITAL_COMMUNITY)
Admission: EM | Admit: 2016-07-06 | Discharge: 2016-07-06 | Disposition: A | Discharge status: Home / Self Care | Payer: Self-pay | Attending: Emergency Medicine | Admitting: Emergency Medicine

## 2016-07-06 ENCOUNTER — Encounter (HOSPITAL_COMMUNITY): Payer: Self-pay | Admitting: Emergency Medicine

## 2016-07-06 DIAGNOSIS — Z794 Long term (current) use of insulin: Secondary | ICD-10-CM | POA: Insufficient documentation

## 2016-07-06 DIAGNOSIS — R062 Wheezing: Secondary | ICD-10-CM | POA: Insufficient documentation

## 2016-07-06 DIAGNOSIS — Z7982 Long term (current) use of aspirin: Secondary | ICD-10-CM | POA: Insufficient documentation

## 2016-07-06 DIAGNOSIS — E119 Type 2 diabetes mellitus without complications: Secondary | ICD-10-CM | POA: Insufficient documentation

## 2016-07-06 DIAGNOSIS — F17210 Nicotine dependence, cigarettes, uncomplicated: Secondary | ICD-10-CM | POA: Insufficient documentation

## 2016-07-06 DIAGNOSIS — I10 Essential (primary) hypertension: Secondary | ICD-10-CM | POA: Insufficient documentation

## 2016-07-06 DIAGNOSIS — J449 Chronic obstructive pulmonary disease, unspecified: Secondary | ICD-10-CM | POA: Insufficient documentation

## 2016-07-06 DIAGNOSIS — E039 Hypothyroidism, unspecified: Secondary | ICD-10-CM | POA: Insufficient documentation

## 2016-07-06 DIAGNOSIS — Z955 Presence of coronary angioplasty implant and graft: Secondary | ICD-10-CM | POA: Insufficient documentation

## 2016-07-06 DIAGNOSIS — R0602 Shortness of breath: Secondary | ICD-10-CM | POA: Insufficient documentation

## 2016-07-06 MED ORDER — ALBUTEROL SULFATE (2.5 MG/3ML) 0.083% IN NEBU
5.0000 mg | INHALATION_SOLUTION | Freq: Once | RESPIRATORY_TRACT | Status: AC
  Administered 2016-07-06: 5 mg via RESPIRATORY_TRACT
  Filled 2016-07-06: qty 6

## 2016-07-06 MED ORDER — IPRATROPIUM BROMIDE 0.02 % IN SOLN
0.5000 mg | Freq: Once | RESPIRATORY_TRACT | Status: AC
  Administered 2016-07-06: 0.5 mg via RESPIRATORY_TRACT
  Filled 2016-07-06: qty 2.5

## 2016-07-06 MED ORDER — ALBUTEROL SULFATE HFA 108 (90 BASE) MCG/ACT IN AERS
2.0000 | INHALATION_SPRAY | RESPIRATORY_TRACT | Status: DC | PRN
  Administered 2016-07-06: 2 via RESPIRATORY_TRACT
  Filled 2016-07-06: qty 6.7

## 2016-07-06 MED ORDER — AEROCHAMBER PLUS FLO-VU MEDIUM MISC
1.0000 | Freq: Once | Status: AC
  Administered 2016-07-06: 1
  Filled 2016-07-06: qty 1

## 2016-07-06 MED ORDER — IPRATROPIUM-ALBUTEROL 0.5-2.5 (3) MG/3ML IN SOLN
3.0000 mL | Freq: Once | RESPIRATORY_TRACT | Status: AC
  Administered 2016-07-06: 3 mL via RESPIRATORY_TRACT
  Filled 2016-07-06: qty 3

## 2016-07-06 NOTE — Discharge Instructions (Signed)
Please read and follow all provided instructions.  Your diagnoses today include:  1. Wheezing   2. Shortness of breath    Tests performed today include:  Vital signs. See below for your results today.   Medications prescribed:   Albuterol inhaler - medication that opens up your airway  Use inhaler as follows: 1-2 puffs with spacer every 4 hours as needed for wheezing, cough, or shortness of breath.  Take any prescribed medications only as directed.  Home care instructions:  Follow any educational materials contained in this packet.  Follow-up instructions: Please follow-up with your primary care provider in the next 3 days for further evaluation of your symptoms and management of your asthma.  Return instructions:   Please return to the Emergency Department if you experience worsening symptoms.  Please return with worsening wheezing, shortness of breath, or difficulty breathing.  Return with persistent fever above 101F.   Please return if you have any other emergent concerns.  Additional Information:  Your vital signs today were: BP 127/63 (BP Location: Left Arm)    Pulse 74    Temp 98.6 F (37 C) (Oral)    Resp 18    Ht 5' 2"  (1.575 m)    Wt 112.9 kg    SpO2 91%    BMI 45.54 kg/m  If your blood pressure (BP) was elevated above 135/85 this visit, please have this repeated by your doctor within one month. --------------

## 2016-07-06 NOTE — ED Provider Notes (Signed)
[facility] DEPT Provider Note   CSN: [id number] Arrival date & time: 07/06/16  0236     History   Chief Complaint Chief Complaint  Patient presents with  . Shortness of Breath    HPI Kendra Adams is a 56 y.o. female.  Patient with history of COPD and asthma, with home albuterol pump -- presents with worsening congestion, wheezing and cough. Patient has had visits recently in the emergency department and with her PCP for the same. She has been on tapered steroids and antibiotics. Overall symptoms seem to be improving however over the past day patient has been unable to produce as much sputum and has felt tight in her chest. She denies any fevers or URI symptoms. No nausea, vomiting, or diarrhea. No chest pain. Patient states that she has a nebulizer machine at home but no albuterol solution. She also does not currently have an albuterol pump. No lower extremity swelling or history of heart failure. Onset of symptoms acute on chronic. Course is slightly worsening. Nothing makes symptoms better.      Past Medical History:  Diagnosis Date  . Allergic rhinitis   . Asthma   . CAD (coronary artery disease)    a. 12/13/10: mRCA 99%, EF 65-70%;  s/p BMS to mRCA  . COPD (chronic obstructive pulmonary disease) ([place])   . Depression   . DM2 (diabetes mellitus, type 2) ([place])   . GERD (gastroesophageal reflux disease)   . HTN (hypertension)   . Hyperlipidemia   . Hyperlipidemia   . Hypothyroidism   . Seasonal allergies   . Tobacco abuse     Patient Active Problem List   Diagnosis Date Noted  . 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 Right wrist pain 10/26/2015  . Psoriasis 10/15/2015  . Tobacco abuse counseling 10/15/2015  . Tendon pain 09/21/2015  . Vaginitis 08/28/2015  . E coli bacteremia 08/19/2015  . Pain in the chest   . Chest pain 08/13/2015  . Status post laparoscopic cholecystectomy 08/12/2015  . Elevated transaminase level 08/12/2015  .  Positive fecal occult blood test 07/10/2015  . Injury of toe on left foot 07/05/2015  . Chronic cough 05/16/2015  . Tendinitis of left rotator cuff 01/19/2015  . Tobacco use disorder 09/07/2014  . Sinusitis 04/29/2012  . Morbid obesity ([place]) 03/19/2011  . Routine health maintenance 02/03/2011  . CAD (coronary artery disease) 12/26/2010  . Diabetes mellitus type 2 with complications ([place]) 70/78/6754  . Hypertension associated with diabetes ([place]) 05/10/2008  . Hypothyroidism 04/27/2008  . HLD (hyperlipidemia) 04/27/2008  . Depression 04/27/2008  . GERD 04/27/2008    Past Surgical History:  Procedure Laterality Date  . CARDIAC CATHETERIZATION N/A 08/14/2015   Procedure: Left Heart Cath and Coronary Angiography;  Surgeon: Peter M Martinique, MD;  Location: [facility];  Service: Cardiovascular;  Laterality: N/A;  . CARPAL TUNNEL RELEASE     w/ bone spurs  . CHOLECYSTECTOMY N/A 08/15/2015   Procedure: LAPAROSCOPIC CHOLECYSTECTOMY WITH ATTEMPTED INTRAOPERATIVE CHOLANGIOGRAM;  Surgeon: Erroll Luna, MD;  Location: [facility];  Service: General;  Laterality: N/A;  . CORONARY ANGIOPLASTY WITH STENT PLACEMENT    . DILATION AND CURETTAGE OF UTERUS      OB History    No data available       Home Medications    Prior to Admission medications   Medication Sig Start Date End Date Taking? Authorizing Provider  albuterol (PROVENTIL HFA;VENTOLIN HFA) 108 (90 Base) MCG/ACT inhaler Inhale 2 puffs into  the lungs every 4 (four) hours as needed for wheezing or shortness of breath. Dispense with aerochamber 06/12/16  Yes [name], MD  aspirin 81 MG chewable tablet Chew 81 mg by mouth every morning.    Yes Historical Provider, MD  atorvastatin (LIPITOR) 80 MG tablet TAKE ONE TABLET BY MOUTH ONCE DAILY Patient taking differently: TAKE 80 MG BY MOUTH EACH MORNING 06/02/16  Yes [name], MD  Blood Glucose Monitoring Suppl DEVI 1 Device by Does not apply route daily before breakfast. 05/23/14   Yes Ejiroghene E Emokpae, MD  fluticasone (FLONASE) 50 MCG/ACT nasal spray Place 2 sprays into both nostrils daily. Patient taking differently: Place 2 sprays into both nostrils daily as needed for allergies.  06/23/16  Yes [name], MD  glipiZIDE (GLUCOTROL) 5 MG tablet Take 1 tablet (5 mg total) by mouth daily. Patient taking differently: Take 2.5 mg by mouth daily after breakfast.  02/25/16 02/24/17 Yes [name], MD  Glucose Blood (BLOOD GLUCOSE TEST STRIPS) STRP 1 strip by In Vitro route daily before breakfast. 05/23/14  Yes Ejiroghene E Emokpae, MD  Insulin Pen Needle (BD PEN NEEDLE NANO U/F) 32G X 4 MM MISC 1 Units/day by Does not apply route daily. 02/26/15  Yes Loleta Chance, MD  Lancets MISC 1 Units by Does not apply route daily. 05/23/14  Yes Ejiroghene Arlyce Dice, MD  levothyroxine (LEVOTHROID) 25 MCG tablet Take 0.5 tablets (12.5 mcg total) by mouth daily. In addition to the 130mg tablet (total 162.5103m daily) 06/23/16 06/23/17 Yes CaVelna OchsMD  levothyroxine (SYNTHROID, LEVOTHROID) 150 MCG tablet TAKE ONE TABLET BY MOUTH ONCE DAILY BEFORE  BREAKFAST Patient taking differently: TAKE 150 MCG BY MOUTH ONCE DAILY BEFORE  BREAKFAST 05/29/16  Yes CaVelna OchsMD  losartan (COZAAR) 25 MG tablet TAKE ONE TABLET BY MOUTH ONCE DAILY Patient taking differently: TAKE 25 MG BY MOUTH EACH MORNING 03/13/16  Yes CaVelna OchsMD  metFORMIN (GLUCOPHAGE) 1000 MG tablet TAKE 1 TABLET BY MOUTH TWICE DAILY Patient taking differently: TAKE 1,000 MG BY MOUTH TWICE DAILY 06/28/16  Yes CaVelna OchsMD  metoprolol succinate (TOPROL-XL) 100 MG 24 hr tablet TAKE ONE TABLET BY MOUTH ONCE DAILY WITH  OR  IMMEDIATELY  FOLLOWING  A  MEAL Patient taking differently: TAKE 100 MG BY MOUTH ONCE EACH MORNING WITH  OR  IMMEDIATELY  FOLLOWING  A  MEAL 02/12/16  Yes CaVelna OchsMD  nitroGLYCERIN (NITROSTAT) 0.4 MG SL tablet Place 1 tablet (0.4 mg total) under the tongue every 5 (five) minutes  as needed for chest pain. 04/27/16  Yes CaVelna OchsMD  Omega-3 Fatty Acids (FISH OIL) 1000 MG CAPS Take 1 capsule by mouth every morning.    Yes Historical Provider, MD  omeprazole (PRILOSEC) 40 MG capsule TAKE ONE CAPSULE BY MOUTH ONCE DAILY Patient taking differently: TAKE 40 MG BY MOUTH ONCE EACH MORNING 02/12/16  Yes CaVelna OchsMD  rOPINIRole (REQUIP) 1 MG tablet TAKE ONE TABLET BY MOUTH AT BEDTIME Patient taking differently: TAKE 1 MG BY MOUTH AT BEDTIME 06/02/16  Yes CaVelna OchsMD  sertraline (ZOLOFT) 100 MG tablet Take 1.5 tablets (150 mg total) by mouth daily. Patient taking differently: Take 150 mg by mouth every morning.  07/01/16  Yes CaVelna OchsMD  Spacer/Aero-Holding Chambers (AEROCHAMBER PLUS) inhaler Use as instructed 02/03/15  Yes AsMelynda RippleMD  triamcinolone cream (KENALOG) 0.1 % Apply 1 application topically 2 (two) times daily. Patient taking differently: Apply 1 application topically 3 (three) times daily as  needed (itching).  02/25/16  Yes [name], MD    Family History Family History  Problem Relation Age of Onset  . Diabetes Father   . Hypertension Father   . Diabetes Mother   . Hypertension Mother     Social History Social History  Substance Use Topics  . Smoking status: Current Every Day Smoker    Packs/day: 0.50    Years: 40.00    Types: Cigarettes    Last attempt to quit: 05/19/2014  . Smokeless tobacco: Current User     Comment: Trying to quit again  . Alcohol use No     Allergies   Gabapentin; Nsaids; and Tramadol hcl   Review of Systems Review of Systems  Constitutional: Negative for fever.  HENT: Negative for rhinorrhea and sore throat.   Eyes: Negative for redness.  Respiratory: Positive for cough, chest tightness and wheezing.   Cardiovascular: Negative for chest pain.  Gastrointestinal: Negative for abdominal pain, diarrhea, nausea and vomiting.  Genitourinary: Negative for dysuria.    Musculoskeletal: Negative for myalgias.  Skin: Negative for rash.  Neurological: Negative for headaches.     Physical Exam Updated Vital Signs BP 127/63 (BP Location: Left Arm)   Pulse 74   Temp 98.6 F (37 C) (Oral)   Resp 18   Ht 5' 2"  (1.575 m)   Wt 112.9 kg   SpO2 91%   BMI 45.54 kg/m   Physical Exam  Constitutional: She appears well-developed and well-nourished.  HENT:  Head: Normocephalic and atraumatic.  Mouth/Throat: Oropharynx is clear and moist.  Eyes: Conjunctivae are normal. Right eye exhibits no discharge. Left eye exhibits no discharge.  Neck: Normal range of motion. Neck supple.  Cardiovascular: Normal rate, regular rhythm and normal heart sounds.   Pulmonary/Chest: Effort normal. No respiratory distress. She has wheezes (Moderate expiratory wheezing all fields). She has no rales. She exhibits no tenderness.  No accessory muscle usage.  Abdominal: Soft. There is no tenderness.  Neurological: She is alert.  Skin: Skin is warm and dry.  Psychiatric: She has a normal mood and affect.  Nursing note and vitals reviewed.    ED Treatments / Results   Procedures Procedures (including critical care time)  Medications Ordered in ED Medications  albuterol (PROVENTIL HFA;VENTOLIN HFA) 108 (90 Base) MCG/ACT inhaler 2 puff (not administered)  AEROCHAMBER PLUS FLO-VU SMALL device MISC 1 each (not administered)  ipratropium-albuterol (DUONEB) 0.5-2.5 (3) MG/3ML nebulizer solution 3 mL (3 mLs Nebulization Given 07/06/16 0302)  albuterol (PROVENTIL) (2.5 MG/3ML) 0.083% nebulizer solution 5 mg (5 mg Nebulization Given 07/06/16 0403)  ipratropium (ATROVENT) nebulizer solution 0.5 mg (0.5 mg Nebulization Given 07/06/16 0403)     Initial Impression / Assessment and Plan / ED Course  I have reviewed the triage vital signs and the nursing notes.  Pertinent labs & imaging results that were available during my care of the patient were reviewed by me and considered in my  medical decision making (see chart for details).     Patient seen and examined. Breathing treatment ordered. Reviewed recent chest x-ray. At this point do not feel that repeat imaging or lab testing is indicated, will reassess.  Vital signs reviewed and are as follows: BP 127/63 (BP Location: Left Arm)   Pulse 74   Temp 98.6 F (37 C) (Oral)   Resp 18   Ht 5' 2"  (1.575 m)   Wt 112.9 kg   SpO2 91%   BMI 45.54 kg/m   5:37 AM patient is much  improved after breathing treatment. Patient has mild end expiratory wheezing. She continues to cough however it has become more productive. Overall, she is feeling much better. Will discharge to home with albuterol inhaler. I encouraged her to use this every 4 hours. Given overall improvement of symptoms over the past week, do not feel that additional course of antibiotics or steroids are indicated at this time. Patient agrees. She will follow-up with her family physician tomorrow for further instructions. Encouraged patient to return with worsening symptoms, increased shortness of breath, increased work of breathing, fevers, new symptoms or other concerns. She verbalizes understanding and agrees with plan.  Final Clinical Impressions(s) / ED Diagnoses   Final diagnoses:  Wheezing  Shortness of breath    New Prescriptions Current Discharge Medication List       Carlisle Cater, PA-C 07/06/16 [place], MD 07/06/16 [phone]

## 2016-07-06 NOTE — ED Triage Notes (Signed)
Pt reports increasing shortness of breath that has became worse in the last few days. Pt states that she has been having difficulty for the last month. Wheezing noted throughout.

## 2016-08-02 ENCOUNTER — Other Ambulatory Visit: Payer: Self-pay | Admitting: Internal Medicine

## 2016-08-02 DIAGNOSIS — I2510 Atherosclerotic heart disease of native coronary artery without angina pectoris: Secondary | ICD-10-CM

## 2016-08-05 NOTE — Telephone Encounter (Signed)
Also received refill request for sertraline from [facility], Kendra Phariss Cassady5/29/201812:07 PM

## 2016-08-06 MED ORDER — SERTRALINE HCL 100 MG PO TABS: 150.0000 mg | ORAL_TABLET | Freq: Every day | ORAL | 2 refills | Status: DC

## 2016-09-06 ENCOUNTER — Other Ambulatory Visit: Payer: Self-pay | Admitting: Internal Medicine

## 2016-09-30 ENCOUNTER — Other Ambulatory Visit: Payer: Self-pay | Admitting: Internal Medicine

## 2016-09-30 NOTE — Telephone Encounter (Signed)
Message sent to front office to schedule an appt.

## 2016-09-30 NOTE — Telephone Encounter (Signed)
Patient needs a follow up appointment. Refilled 1 month supply of metformin. Please schedule patient at her earliest convenience. Thanks!

## 2016-10-20 ENCOUNTER — Encounter: Payer: Self-pay | Admitting: Internal Medicine

## 2016-11-24 ENCOUNTER — Encounter: Payer: Self-pay | Admitting: Internal Medicine

## 2016-11-24 ENCOUNTER — Ambulatory Visit (INDEPENDENT_AMBULATORY_CARE_PROVIDER_SITE_OTHER): Payer: Self-pay | Admitting: Internal Medicine

## 2016-11-24 VITALS — BP 131/68 | HR 80 | Temp 97.6°F | Ht 62.0 in | Wt 256.8 lb

## 2016-11-24 DIAGNOSIS — Z7984 Long term (current) use of oral hypoglycemic drugs: Secondary | ICD-10-CM

## 2016-11-24 DIAGNOSIS — I2510 Atherosclerotic heart disease of native coronary artery without angina pectoris: Secondary | ICD-10-CM

## 2016-11-24 DIAGNOSIS — I152 Hypertension secondary to endocrine disorders: Secondary | ICD-10-CM

## 2016-11-24 DIAGNOSIS — K219 Gastro-esophageal reflux disease without esophagitis: Secondary | ICD-10-CM

## 2016-11-24 DIAGNOSIS — G2581 Restless legs syndrome: Secondary | ICD-10-CM

## 2016-11-24 DIAGNOSIS — R053 Chronic cough: Secondary | ICD-10-CM

## 2016-11-24 DIAGNOSIS — E669 Obesity, unspecified: Secondary | ICD-10-CM

## 2016-11-24 DIAGNOSIS — Z23 Encounter for immunization: Secondary | ICD-10-CM

## 2016-11-24 DIAGNOSIS — Z6841 Body Mass Index (BMI) 40.0 and over, adult: Secondary | ICD-10-CM

## 2016-11-24 DIAGNOSIS — R05 Cough: Secondary | ICD-10-CM

## 2016-11-24 DIAGNOSIS — Z9114 Patient's other noncompliance with medication regimen: Secondary | ICD-10-CM

## 2016-11-24 DIAGNOSIS — E038 Other specified hypothyroidism: Secondary | ICD-10-CM

## 2016-11-24 DIAGNOSIS — E039 Hypothyroidism, unspecified: Secondary | ICD-10-CM

## 2016-11-24 DIAGNOSIS — E1159 Type 2 diabetes mellitus with other circulatory complications: Secondary | ICD-10-CM

## 2016-11-24 DIAGNOSIS — Z79899 Other long term (current) drug therapy: Secondary | ICD-10-CM

## 2016-11-24 DIAGNOSIS — R062 Wheezing: Secondary | ICD-10-CM

## 2016-11-24 DIAGNOSIS — E118 Type 2 diabetes mellitus with unspecified complications: Secondary | ICD-10-CM

## 2016-11-24 DIAGNOSIS — Z0000 Encounter for general adult medical examination without abnormal findings: Secondary | ICD-10-CM

## 2016-11-24 DIAGNOSIS — K08109 Complete loss of teeth, unspecified cause, unspecified class: Secondary | ICD-10-CM

## 2016-11-24 DIAGNOSIS — I10 Essential (primary) hypertension: Secondary | ICD-10-CM

## 2016-11-24 DIAGNOSIS — F17210 Nicotine dependence, cigarettes, uncomplicated: Secondary | ICD-10-CM

## 2016-11-24 DIAGNOSIS — Z8669 Personal history of other diseases of the nervous system and sense organs: Secondary | ICD-10-CM

## 2016-11-24 LAB — POCT GLYCOSYLATED HEMOGLOBIN (HGB A1C): Hemoglobin A1C: 7.5

## 2016-11-24 LAB — GLUCOSE, CAPILLARY: Glucose-Capillary: 133 mg/dL — ABNORMAL HIGH (ref 65–99)

## 2016-11-24 MED ORDER — LOSARTAN POTASSIUM 25 MG PO TABS: 25.0000 mg | ORAL_TABLET | Freq: Every day | ORAL | 2 refills | Status: DC

## 2016-11-24 MED ORDER — ALBUTEROL SULFATE HFA 108 (90 BASE) MCG/ACT IN AERS: 2.0000 | INHALATION_SPRAY | RESPIRATORY_TRACT | 2 refills | Status: DC | PRN

## 2016-11-24 MED ORDER — METOPROLOL SUCCINATE ER 100 MG PO TB24: ORAL_TABLET | ORAL | 0 refills | Status: DC

## 2016-11-24 MED ORDER — METFORMIN HCL 1000 MG PO TABS: 1000.0000 mg | ORAL_TABLET | Freq: Two times a day (BID) | ORAL | 5 refills | Status: DC

## 2016-11-24 MED ORDER — ATORVASTATIN CALCIUM 80 MG PO TABS: 80.0000 mg | ORAL_TABLET | Freq: Every day | ORAL | 1 refills | Status: DC

## 2016-11-24 MED ORDER — OMEPRAZOLE 40 MG PO CPDR: 40.0000 mg | DELAYED_RELEASE_CAPSULE | Freq: Every day | ORAL | 2 refills | Status: DC

## 2016-11-24 MED ORDER — ROPINIROLE HCL 1 MG PO TABS: 1.0000 mg | ORAL_TABLET | Freq: Every day | ORAL | 5 refills | Status: DC

## 2016-11-24 NOTE — Assessment & Plan Note (Signed)
Refilled omeprazole.

## 2016-11-24 NOTE — Patient Instructions (Addendum)
Ms. Noyce,  It was a pleasure to see you today. I have sent refills of your medication to your pharmacy. For your thyroid, please stop taking the 1/2 tablet of the 25 ug dose and take only the 150 ug tablet. We will recheck your thyroid levels in 3 months. Please continue to take your diabetes medications and blood pressure medications as previously prescribed. If you have any questions or concerns, call our clinic at [phone] or after hours call [phone] and ask for the internal medicine resident on call. Thank you!  - Dr. Philipp Ovens

## 2016-11-24 NOTE — Assessment & Plan Note (Signed)
Patient is prescribed Synthroid 162.5 g daily. She takes one 150 g tablet and 1/2 of a 25 ug tablet daily. At her last visit, patient had run out of her 25 g tablets and had only been taking 150 ug daily. TSH at that visit was within normal limits. Plan was to restart the 12.5 g daily and recheck today, however patient has again run out of the 25 g tablets. Given that her last TSH was normal and patient is asymptomatic, we will hold off on restarting the additional 12.5 g daily and recheck TSH at her next visit. -- Decrease dose 162.5 -> 150 ug daily (stop 1/2 tab of 25 ug daily)  -- Recheck TSH at next visit

## 2016-11-24 NOTE — Assessment & Plan Note (Signed)
Patient reports compliance with her metoprolol 100 mg daily and her losartan 25 mg daily. Blood pressure today is well controlled, 131/68. -- Continue current regimen

## 2016-11-24 NOTE — Progress Notes (Signed)
   CC: BP and DM follow up  HPI:  [name] is a 56 y.o. female with past medical history outlined below here for follow up of BP and diabetes. For the details of today's visit, please refer to the assessment and plan.  Past Medical History:  Diagnosis Date  . Allergic rhinitis   . Asthma   . CAD (coronary artery disease)    a. 12/13/10: mRCA 99%, EF 65-70%;  s/p BMS to mRCA  . COPD (chronic obstructive pulmonary disease) ([place])   . Depression   . DM2 (diabetes mellitus, type 2) ([place])   . GERD (gastroesophageal reflux disease)   . HTN (hypertension)   . Hyperlipidemia   . Hyperlipidemia   . Hypothyroidism   . Seasonal allergies   . Tobacco abuse     Review of Systems:  Negative for chest pain. Positive for SOB and cough.   Physical Exam:  Vitals:   11/24/16 1329  BP: 131/68  Pulse: 80  Temp: 97.6 F (36.4 C)  TempSrc: Oral  SpO2: 95%  Weight: 256 lb 12.8 oz (116.5 kg)  Height: 5' 2"  (1.575 m)    Constitutional: Obese, NAD HEENT: Edentulous, moist mucous membranes   Cardiovascular: RRR, no murmurs, rubs, or gallops.  Pulmonary/Chest: CTAB Extremities: Warm and well perfused. Normal diabetic foot exam (distal Pulses intact, normal monofilament exam, normal vibratory sensation, no ulcers) Psychiatric: Normal mood and affect  Assessment & Plan:   See Encounters Tab for problem based charting.  Patient discussed with Dr. Angelia Mould

## 2016-11-24 NOTE — Assessment & Plan Note (Signed)
Refilled ropinirole.

## 2016-11-24 NOTE — Assessment & Plan Note (Signed)
Patient endorses chronic cough and wheezing that is worse at night. Likely underlying COPD given her chronic tobacco use. She continues to smoke half a pack of cigarettes a day.  -- Encouraged cessation -- Refilled albuterol prn

## 2016-11-24 NOTE — Assessment & Plan Note (Signed)
Flu shot given today

## 2016-11-24 NOTE — Assessment & Plan Note (Addendum)
Patient is prescribed metformin 1000 mg twice a day and glipizide 5 mg daily. She reports tolerating these medications well without side effects. A1c has increased from 6.7 three months ago to 7.5 today. Patient endorses a poor diet consisting mainly of soda and fast food. She also reports forgetting to take her medication at least 1-2 times a week including today. Today we discussed dietary modifications and medication compliance. I offered a diabetic nutritionist referral and patient declined.  -- Continue metformin 1000 mg BID -- Continue Glipizide 5 mg daily  -- Encouraged diet and lifestyle modification  -- Complete foot exam performed today was normal  -- Follow up 3 months

## 2016-11-25 NOTE — Progress Notes (Signed)
Internal Medicine Clinic Attending  Case discussed with Dr. Guilloud at the time of the visit.  We reviewed the resident's history and exam and pertinent patient test results.  I agree with the assessment, diagnosis, and plan of care documented in the resident's note.  

## 2016-12-16 IMAGING — CR DG HAND COMPLETE 3+V*R*
3 series · 3 of 3 positions shown · non-contrast
Comparison: 12/10/2011

CLINICAL DATA: Psoriatic arthritis.

EXAM:
RIGHT HAND - COMPLETE 3+ VIEW

[w hand pa right]
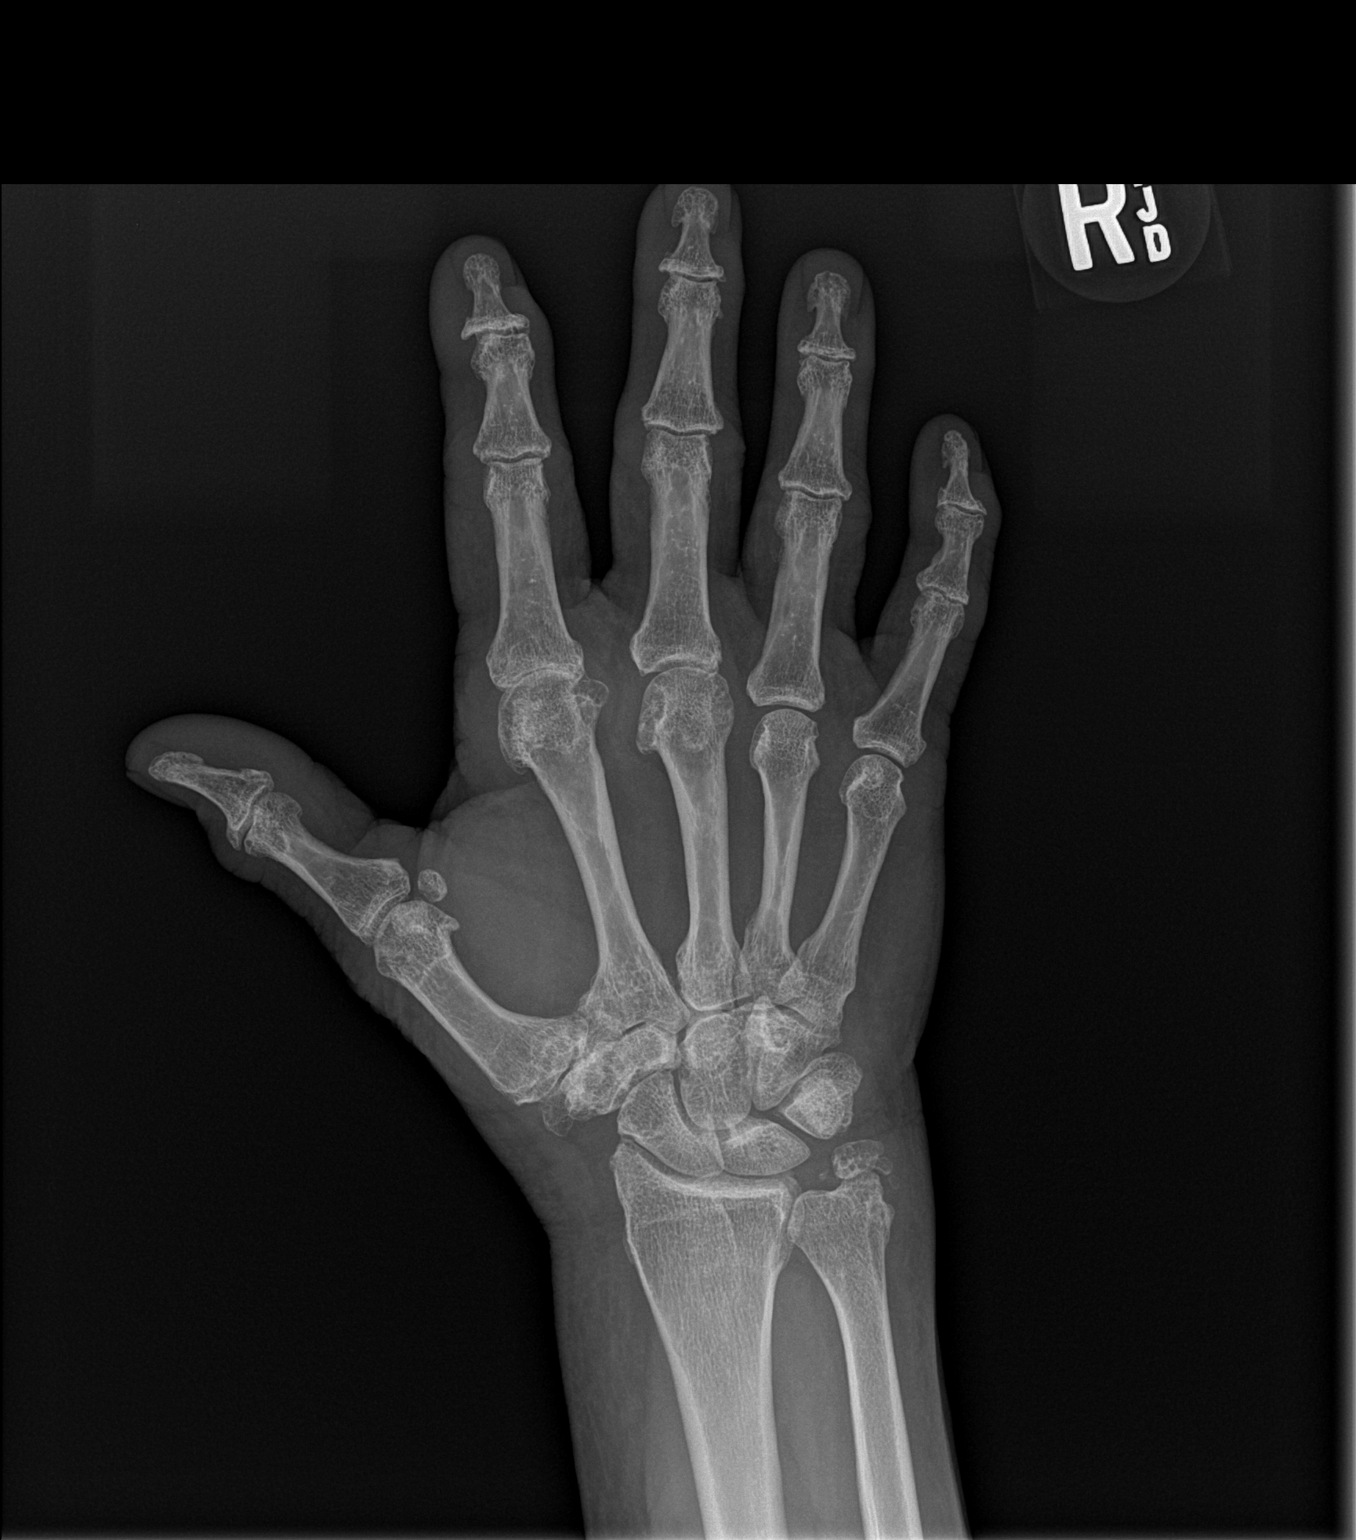

[w hand obl right]
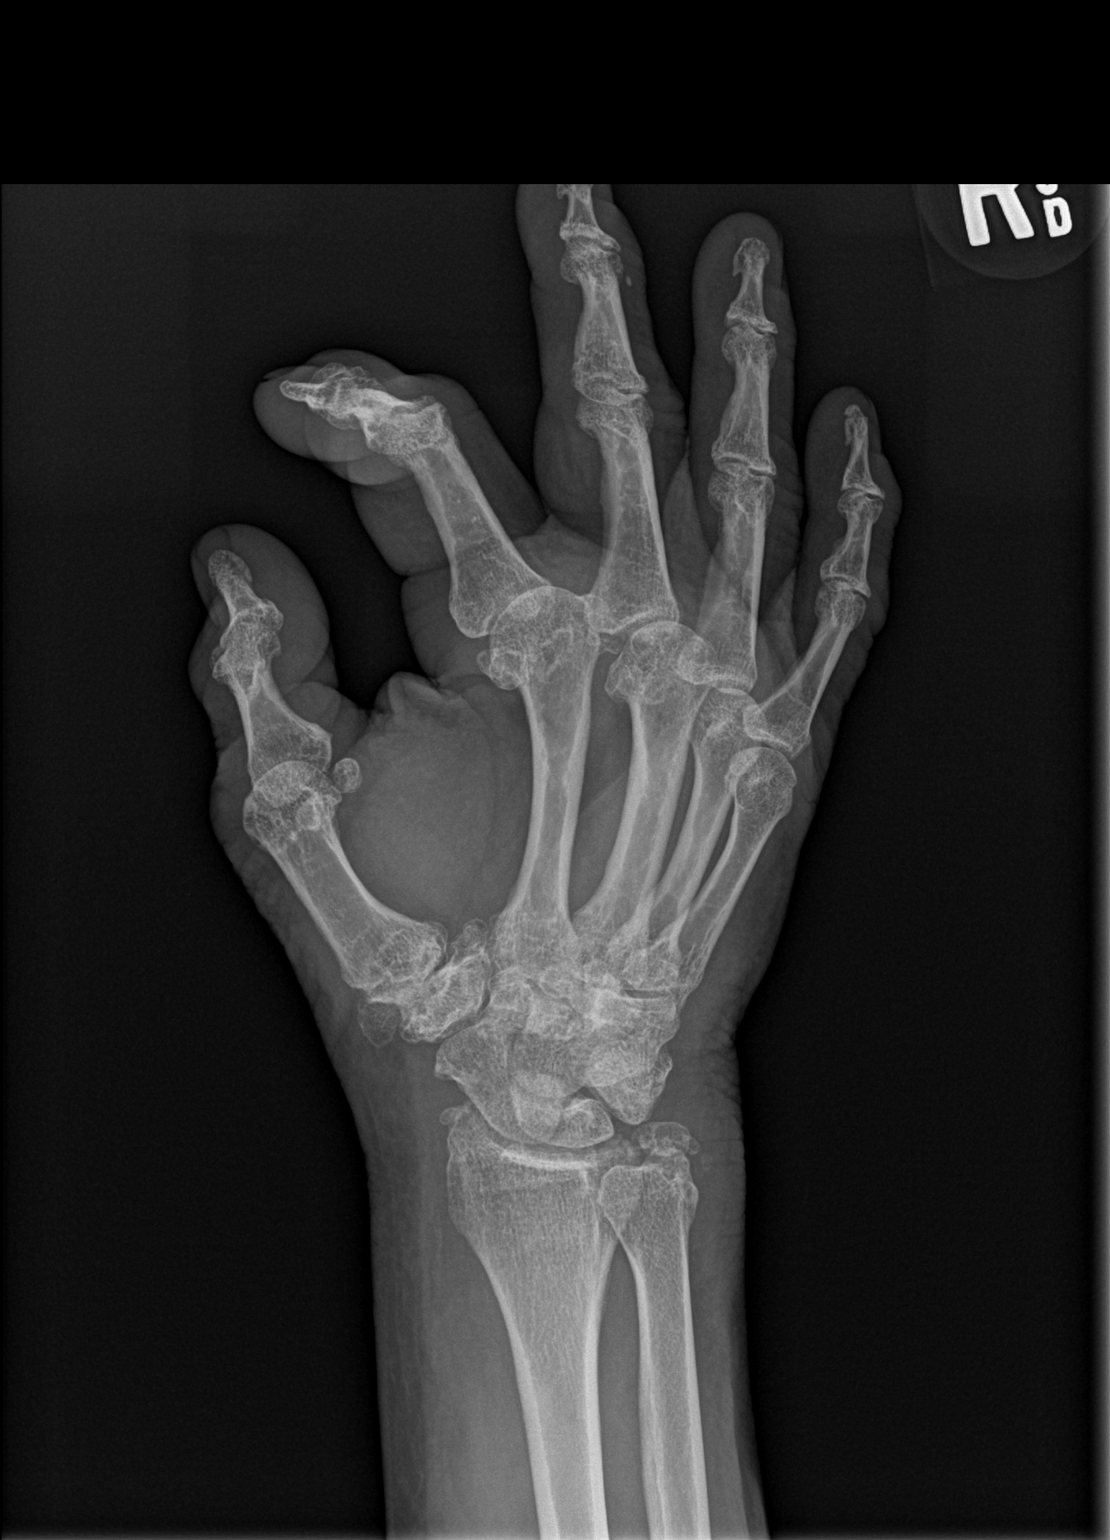

[w hand lat right]
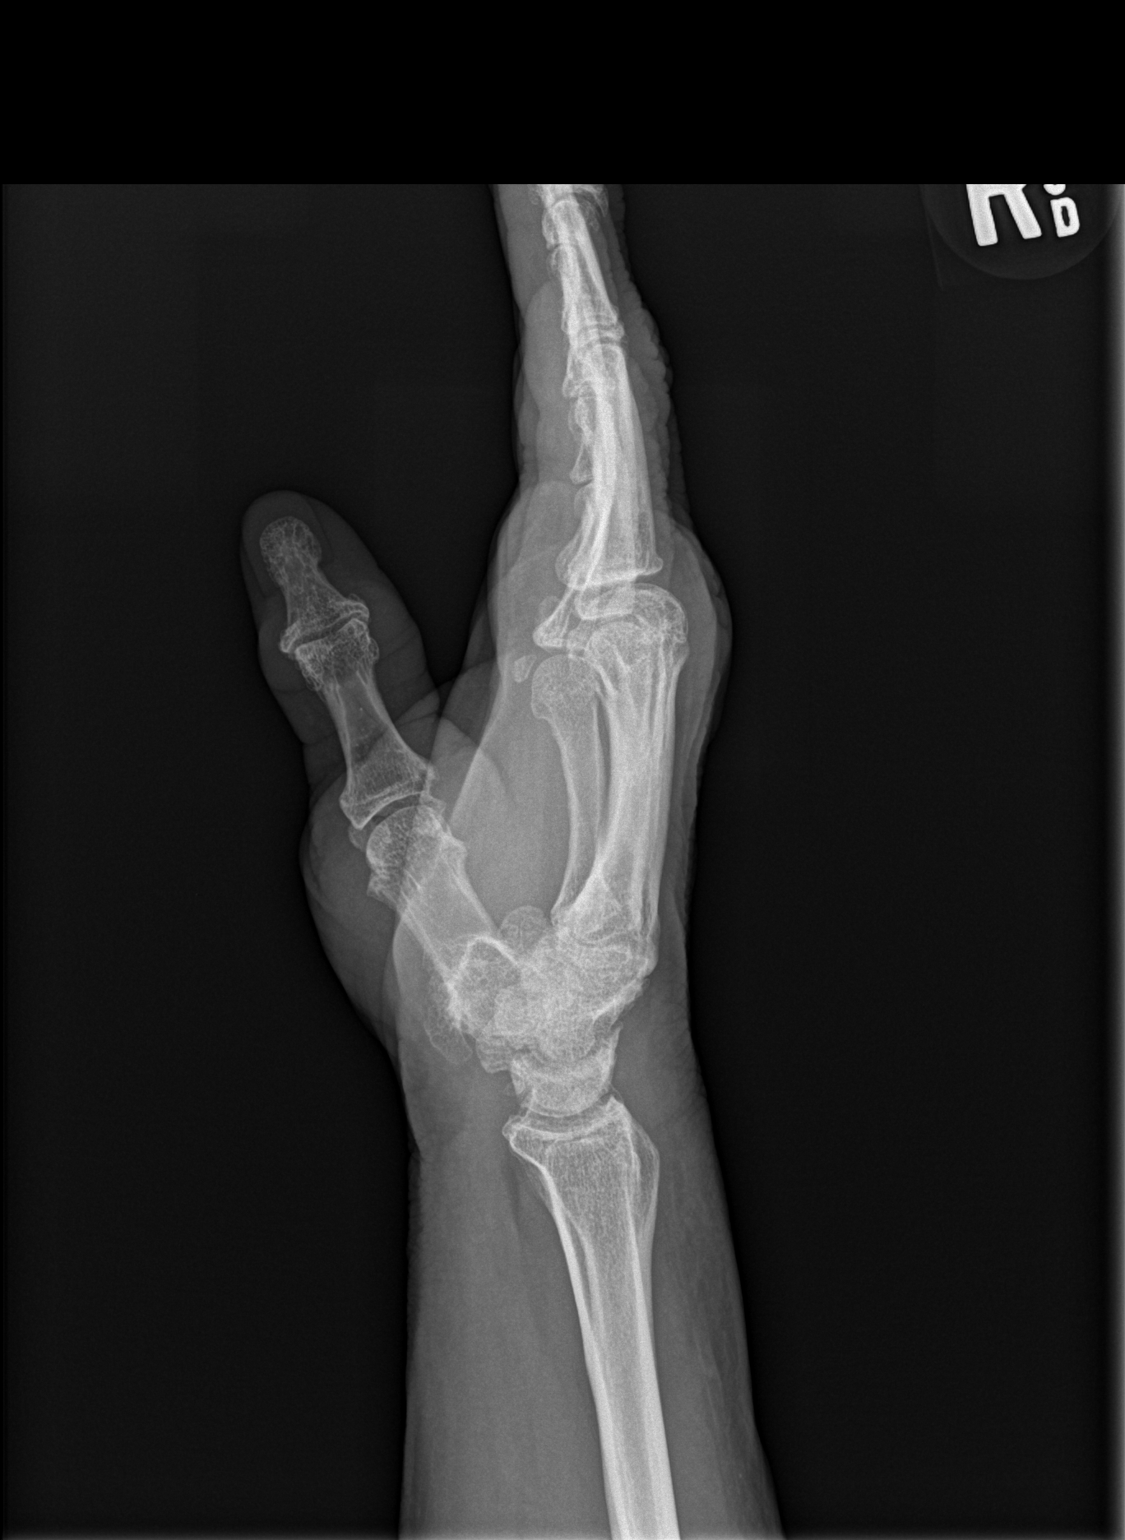

[3 of 3 positions shown; findings below may reference images not displayed]

FINDINGS: Bone mineralization is relatively normal for age. I do not see any
obvious periarticular osteopenia.

Moderate osteoarthritic type degenerative changes involving the DIP
and PIP joints of the fingers and severe degenerative changes at the
carpometacarpal joints of the thumb.

There are significant degenerative changes involving the second
third metacarpal phalangeal joints with joint space narrowing and
suspected erosions. There also appear to be early hooked
osteophytes. Suspect small erosions involving the fourth metacarpal
head and the fifth proximal phalanx.

Mild degenerative changes involving the intercarpal and radiocarpal
joints. There is a stable large erosion noted in the ulnar styloid
along with a unfused secondary ossification center or old ununited
fracture. There also findings suspicious for chondrocalcinosis.
IMPRESSION: 1. Changes of an erosive/inflammatory arthropathy involving the
metacarpophalangeal joints and wrist joints. Chondrocalcinosis and
hooked osteophytes would be most typical for CPPD arthropathy.
2. Probable osteoarthritic type degenerative changes involving the
DIP and PIP joints of the fingers and the carpometacarpal joint of
thumb.

## 2016-12-31 ENCOUNTER — Ambulatory Visit (INDEPENDENT_AMBULATORY_CARE_PROVIDER_SITE_OTHER): Payer: Self-pay | Admitting: Internal Medicine

## 2016-12-31 ENCOUNTER — Encounter: Payer: Self-pay | Admitting: Internal Medicine

## 2016-12-31 DIAGNOSIS — L209 Atopic dermatitis, unspecified: Secondary | ICD-10-CM

## 2016-12-31 DIAGNOSIS — R21 Rash and other nonspecific skin eruption: Secondary | ICD-10-CM

## 2016-12-31 MED ORDER — BETAMETHASONE DIPROPIONATE 0.05 % EX CREA: TOPICAL_CREAM | Freq: Two times a day (BID) | CUTANEOUS | 1 refills | Status: DC

## 2016-12-31 NOTE — Patient Instructions (Signed)
Ms. Urquilla,  For your rash, please apply betamethasone to affected areas. Wait 15 minutes, then apply a moisturizing cream like Cerave or Eucerin. Do this two times a day for the next two weeks. Follow up with me in 2 weeks or sooner if you do not improve. If you have any questions or concerns, call our clinic at [phone] or after hours call [phone] and ask for the internal medicine resident on call. Thank you!   Eczema Eczema, also called atopic dermatitis, is a skin disorder that causes inflammation of the skin. It causes a red rash and dry, scaly skin. The skin becomes very itchy. Eczema is generally worse during the cooler winter months and often improves with the warmth of summer. Eczema usually starts showing signs in infancy. Some children outgrow eczema, but it may last through adulthood. What are the causes? The exact cause of eczema is not known, but it appears to run in families. People with eczema often have a family history of eczema, allergies, asthma, or hay fever. Eczema is not contagious. Flare-ups of the condition may be caused by:  Contact with something you are sensitive or allergic to.  Stress.  What are the signs or symptoms?  Dry, scaly skin.  Red, itchy rash.  Itchiness. This may occur before the skin rash and may be very intense. How is this diagnosed? The diagnosis of eczema is usually made based on symptoms and medical history. How is this treated? Eczema cannot be cured, but symptoms usually can be controlled with treatment and other strategies. A treatment plan might include:  Controlling the itching and scratching. ? Use over-the-counter antihistamines as directed for itching. This is especially useful at night when the itching tends to be worse. ? Use over-the-counter steroid creams as directed for itching. ? Avoid scratching. Scratching makes the rash and itching worse. It may also result in a skin infection (impetigo) due to a break in the skin  caused by scratching.  Keeping the skin well moisturized with creams every day. This will seal in moisture and help prevent dryness. Lotions that contain alcohol and water should be avoided because they can dry the skin.  Limiting exposure to things that you are sensitive or allergic to (allergens).  Recognizing situations that cause stress.  Developing a plan to manage stress.  Follow these instructions at home:  Only take over-the-counter or prescription medicines as directed by your health care provider.  Do not use anything on the skin without checking with your health care provider.  Keep baths or showers short (5 minutes) in warm (not hot) water. Use mild cleansers for bathing. These should be unscented. You may add nonperfumed bath oil to the bath water. It is best to avoid soap and bubble bath.  Immediately after a bath or shower, when the skin is still damp, apply a moisturizing ointment to the entire body. This ointment should be a petroleum ointment. This will seal in moisture and help prevent dryness. The thicker the ointment, the better. These should be unscented.  Keep fingernails cut short. Children with eczema may need to wear soft gloves or mittens at night after applying an ointment.  Dress in clothes made of cotton or cotton blends. Dress lightly, because heat increases itching.  A child with eczema should stay away from anyone with fever blisters or cold sores. The virus that causes fever blisters (herpes simplex) can cause a serious skin infection in children with eczema. Contact a health care provider if:  Your  itching interferes with sleep.  Your rash gets worse or is not better within 1 week after starting treatment.  You see pus or soft yellow scabs in the rash area.  You have a fever.  You have a rash flare-up after contact with someone who has fever blisters. This information is not intended to replace advice given to you by your health care provider.  Make sure you discuss any questions you have with your health care provider. Document Released: 02/22/2000 Document Revised: 08/02/2015 Document Reviewed: 09/27/2012 Elsevier Interactive Patient Education  2017 [employer].

## 2016-12-31 NOTE — Progress Notes (Signed)
   CC: Rash  HPI:  [name] is a 55 y.o. female with past medical history outlined below here for rash. For the details of today's visit, please refer to the assessment and plan.  Past Medical History:  Diagnosis Date  . Allergic rhinitis   . Asthma   . CAD (coronary artery disease)    a. 12/13/10: mRCA 99%, EF 65-70%;  s/p BMS to mRCA  . COPD (chronic obstructive pulmonary disease) ([place])   . Depression   . DM2 (diabetes mellitus, type 2) ([place])   . GERD (gastroesophageal reflux disease)   . HTN (hypertension)   . Hyperlipidemia   . Hyperlipidemia   . Hypothyroidism   . Seasonal allergies   . Tobacco abuse     Review of Systems  Constitutional: Negative for fever.  Skin: Positive for itching and rash.     Physical Exam:  Vitals:   12/31/16 1315  BP: 139/67  Pulse: 70  Temp: 98.2 F (36.8 C)  TempSrc: Oral  SpO2: 97%  Weight: 252 lb 1.6 oz (114.4 kg)  Height: 5' 2"  (1.575 m)    Constitutional: NAD, appears comfortable Cardiovascular: RRR, no murmurs, rubs, or gallops.  Pulmonary/Chest: CTAB, no wheezes, rales, or rhonchi.  Extremities: Warm and well perfused. No edema.  Skin: No rashes or erythema          Assessment & Plan:   See Encounters Tab for problem based charting.  Patient discussed with Dr. Dareen Piano

## 2017-01-01 DIAGNOSIS — L209 Atopic dermatitis, unspecified: Secondary | ICD-10-CM | POA: Insufficient documentation

## 2017-01-01 NOTE — Assessment & Plan Note (Signed)
Patient is here with rash consistent with severe atopic dermatitis (see pictures under media tab from today's visit). She has a prior diagnosis of psoriasis, however I'm not convinced of this diagnosis. Exam today shows diffuse, non-discrete areas of erythema with flaky dry and cracked skin. She has been using a moderate potency topical corticosteroid (triamcinolone) without relief. Advised patient to apply daily hypoallergenic moisturizers like Cerave or Eucerin. Instructed her to avoid prolonged or extremely hot showers. We will escalate to high potency topical corticosteroid for 2 weeks. Unfortunately patient is uninsured without the orange card and cannot be referred to dermatology at this time. -- Betamethasone cream BID x 2 weeks  -- Instructed to apply Cerave or Eucerin (hypoallergenic lotion) 15 minutes after applying medicated cream BID -- F/u 2 weeks

## 2017-01-02 NOTE — Progress Notes (Signed)
Internal Medicine Clinic Attending  I saw and evaluated the patient.  I personally confirmed the key portions of the history and exam documented by Dr. Philipp Ovens and I reviewed pertinent patient test results.  The assessment, diagnosis, and plan were formulated together and I agree with the documentation in the resident's note.

## 2017-02-17 ENCOUNTER — Encounter (HOSPITAL_COMMUNITY): Payer: Self-pay | Admitting: Emergency Medicine

## 2017-02-17 ENCOUNTER — Other Ambulatory Visit: Payer: Self-pay

## 2017-02-17 ENCOUNTER — Ambulatory Visit (HOSPITAL_COMMUNITY)
Admission: EM | Admit: 2017-02-17 | Discharge: 2017-02-17 | Disposition: A | Discharge status: Home / Self Care | Payer: Self-pay | Attending: Internal Medicine | Admitting: Internal Medicine

## 2017-02-17 ENCOUNTER — Ambulatory Visit (HOSPITAL_COMMUNITY): Payer: Self-pay

## 2017-02-17 DIAGNOSIS — J209 Acute bronchitis, unspecified: Secondary | ICD-10-CM

## 2017-02-17 MED ORDER — PREDNISONE 20 MG PO TABS: 40.0000 mg | ORAL_TABLET | Freq: Every day | ORAL | 0 refills | Status: DC

## 2017-02-17 MED ORDER — ALBUTEROL SULFATE HFA 108 (90 BASE) MCG/ACT IN AERS: 1.0000 | INHALATION_SPRAY | Freq: Four times a day (QID) | RESPIRATORY_TRACT | 0 refills | Status: DC | PRN

## 2017-02-17 MED ORDER — IPRATROPIUM-ALBUTEROL 0.5-2.5 (3) MG/3ML IN SOLN
3.0000 mL | Freq: Once | RESPIRATORY_TRACT | Status: AC
  Administered 2017-02-17: 3 mL via RESPIRATORY_TRACT

## 2017-02-17 MED ORDER — IPRATROPIUM-ALBUTEROL 0.5-2.5 (3) MG/3ML IN SOLN
RESPIRATORY_TRACT | Status: AC
  Filled 2017-02-17: qty 3

## 2017-02-17 NOTE — ED Triage Notes (Signed)
Pt has a history of respiratory illnesses.  She states this time she has been feeling it building it up for several weeks, but in the last three days she has felt very congested.  She has been using an inhaler and taking Mucinex.

## 2017-02-17 NOTE — ED Provider Notes (Signed)
[facility]    CSN: [id number] Arrival date & time: 02/17/17  1748     History   Chief Complaint Chief Complaint  Patient presents with  . chest congestion    HPI Kendra Adams is a 56 y.o. female presents to the urgent care facility for evaluation of nonproductive cough, congestion, sinus pain, pressure, wheezing.  Symptoms have been ongoing for 2 weeks but increased over the last 3-4 days.  She mostly complains of chest tightness and wheezing as well as a painful nonproductive cough.  She has been taking over-the-counter Mucinex with some improvement.  She is drinking lots of fluids.  She denies any fevers but does have some mild shortness of breath.  She has used her albuterol inhaler 3 times over the last 2-3 days with mild improvement.  She denies any abdominal pain, nausea vomiting or diarrhea.  HPI  Past Medical History:  Diagnosis Date  . Allergic rhinitis   . Asthma   . CAD (coronary artery disease)    a. 12/13/10: mRCA 99%, EF 65-70%;  s/p BMS to mRCA  . COPD (chronic obstructive pulmonary disease) ([place])   . Depression   . DM2 (diabetes mellitus, type 2) ([place])   . GERD (gastroesophageal reflux disease)   . HTN (hypertension)   . Hyperlipidemia   . Hyperlipidemia   . Hypothyroidism   . Seasonal allergies   . Tobacco abuse     Patient Active Problem List   Diagnosis Date Noted  . Atopic dermatitis 01/01/2017  . History of restless legs syndrome 11/24/2016  . 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 Right wrist pain 10/26/2015  . Psoriasis 10/15/2015  . Status post laparoscopic cholecystectomy 08/12/2015  . Elevated transaminase level 08/12/2015  . Positive fecal occult blood test 07/10/2015  . Chronic cough 05/16/2015  . Tendinitis of left rotator cuff 01/19/2015  . Tobacco use disorder 09/07/2014  . Morbid obesity ([place]) 03/19/2011  . Routine health maintenance 02/03/2011  . CAD (coronary artery disease) 12/26/2010  .  Diabetes mellitus type 2 with complications ([place]) 40/12/2723  . Hypertension associated with diabetes ([place]) 05/10/2008  . Hypothyroidism 04/27/2008  . HLD (hyperlipidemia) 04/27/2008  . Depression 04/27/2008  . GERD 04/27/2008    Past Surgical History:  Procedure Laterality Date  . CARDIAC CATHETERIZATION N/A 08/14/2015   Procedure: Left Heart Cath and Coronary Angiography;  Surgeon: Peter M Martinique, MD;  Location: [facility];  Service: Cardiovascular;  Laterality: N/A;  . CARPAL TUNNEL RELEASE     w/ bone spurs  . CHOLECYSTECTOMY N/A 08/15/2015   Procedure: LAPAROSCOPIC CHOLECYSTECTOMY WITH ATTEMPTED INTRAOPERATIVE CHOLANGIOGRAM;  Surgeon: Erroll Luna, MD;  Location: [facility];  Service: General;  Laterality: N/A;  . CORONARY ANGIOPLASTY WITH STENT PLACEMENT    . DILATION AND CURETTAGE OF UTERUS      OB History    No data available       Home Medications    Prior to Admission medications   Medication Sig Start Date End Date Taking? Authorizing Provider  aspirin 81 MG chewable tablet Chew 81 mg by mouth every morning.    Yes [provider]  atorvastatin (LIPITOR) 80 MG tablet Take 1 tablet (80 mg total) by mouth daily. 11/24/16  Yes [name], MD  betamethasone dipropionate (DIPROLENE) 0.05 % cream Apply topically 2 (two) times daily. 12/31/16  Yes [name], MD  fluticasone (FLONASE) 50 MCG/ACT nasal spray Place 2 sprays into both nostrils daily. Patient taking differently: Place  2 sprays into both nostrils daily as needed for allergies.  06/23/16  Yes [name], MD  glipiZIDE (GLUCOTROL) 5 MG tablet Take 1 tablet (5 mg total) by mouth daily. Patient taking differently: Take 2.5 mg by mouth daily after breakfast.  02/25/16 02/24/17 Yes [name], MD  levothyroxine (LEVOTHROID) 25 MCG tablet Take 0.5 tablets (12.5 mcg total) by mouth daily. In addition to the 131mg tablet (total 162.531m daily) 06/23/16 06/23/17 Yes GuVelna OchsMD    levothyroxine (SYNTHROID, LEVOTHROID) 150 MCG tablet TAKE ONE TABLET BY MOUTH ONCE DAILY BEFORE  BREAKFAST Patient taking differently: TAKE 150 MCG BY MOUTH ONCE DAILY BEFORE  BREAKFAST 05/29/16  Yes GuVelna OchsMD  losartan (COZAAR) 25 MG tablet Take 1 tablet (25 mg total) by mouth daily. 11/24/16  Yes GuVelna OchsMD  metFORMIN (GLUCOPHAGE) 1000 MG tablet Take 1 tablet (1,000 mg total) by mouth 2 (two) times daily. 11/24/16  Yes GuVelna OchsMD  metoprolol succinate (TOPROL-XL) 100 MG 24 hr tablet TAKE ONE TABLET BY MOUTH ONCE DAILY WITH  OR  IMMEDIATELY  FOLLOWING  A  MEAL 11/24/16  Yes GuVelna OchsMD  Omega-3 Fatty Acids (FISH OIL) 1000 MG CAPS Take 1 capsule by mouth every morning.    Yes [provider]  omeprazole (PRILOSEC) 40 MG capsule Take 1 capsule (40 mg total) by mouth daily. 11/24/16  Yes GuVelna OchsMD  rOPINIRole (REQUIP) 1 MG tablet Take 1 tablet (1 mg total) by mouth at bedtime. 11/24/16  Yes GuVelna OchsMD  sertraline (ZOLOFT) 100 MG tablet Take 1.5 tablets (150 mg total) by mouth daily. 08/06/16  Yes GuVelna OchsMD  Spacer/Aero-Holding Chambers (AEROCHAMBER PLUS) inhaler Use as instructed 02/03/15  Yes MoMelynda RippleMD  triamcinolone cream (KENALOG) 0.1 % Apply 1 application topically 2 (two) times daily. Patient taking differently: Apply 1 application topically 3 (three) times daily as needed (itching).  02/25/16  Yes GuVelna OchsMD  albuterol (PROVENTIL HFA;VENTOLIN HFA) 108 (90 Base) MCG/ACT inhaler Inhale 1-2 puffs into the lungs every 6 (six) hours as needed for wheezing or shortness of breath. 02/17/17   GaDuanne GuessPA-C  Blood Glucose Monitoring Suppl DEVI 1 Device by Does not apply route daily before breakfast. 05/23/14   Emokpae, Ejiroghene E, MD  Glucose Blood (BLOOD GLUCOSE TEST STRIPS) STRP 1 strip by In Vitro route daily before breakfast. 05/23/14   Emokpae, Ejiroghene E, MD  Insulin Pen Needle (BD PEN  NEEDLE NANO U/F) 32G X 4 MM MISC 1 Units/day by Does not apply route daily. 02/26/15   FlLoleta ChanceMD  Lancets MISC 1 Units by Does not apply route daily. 05/23/14   Emokpae, Ejiroghene E, MD  nitroGLYCERIN (NITROSTAT) 0.4 MG SL tablet Place 1 tablet (0.4 mg total) under the tongue every 5 (five) minutes as needed for chest pain. 04/27/16   GuVelna OchsMD  predniSONE (DELTASONE) 20 MG tablet Take 2 tablets (40 mg total) by mouth daily. 02/17/17   GaDuanne GuessPA-C    Family History Family History  Problem Relation Age of Onset  . Diabetes Father   . Hypertension Father   . Diabetes Mother   . Hypertension Mother     Social History Social History   Tobacco Use  . Smoking status: Current Every Day Smoker    Packs/day: 0.50    Years: 40.00    Pack years: 20.00    Types: Cigarettes    Last attempt to quit: 05/19/2014    Years  since quitting: 2.7  . Smokeless tobacco: Current User  . Tobacco comment: Trying to quit again  Substance Use Topics  . Alcohol use: No    Alcohol/week: 0.0 oz  . Drug use: Yes    Types: Marijuana    Comment: Marijuana Use     Allergies   Gabapentin; Nsaids; and Tramadol hcl   Review of Systems Review of Systems  Constitutional: Negative for chills, fatigue and fever.  HENT: Positive for congestion, rhinorrhea and sinus pain. Negative for sore throat and trouble swallowing.   Respiratory: Positive for cough, shortness of breath and wheezing. Negative for chest tightness.   Cardiovascular: Negative for chest pain.  Gastrointestinal: Negative for abdominal pain.  Genitourinary: Negative for difficulty urinating, dysuria and urgency.  Musculoskeletal: Negative for back pain and myalgias.  Skin: Negative for rash.  Neurological: Negative for dizziness and headaches.     Physical Exam Triage Vital Signs ED Triage Vitals [02/17/17 1802]  Enc Vitals Group     BP 122/78     Pulse Rate 86     Resp      Temp 98.2 F (36.8 C)     Temp  Source Oral     SpO2 97 %     Weight      Height      Head Circumference      Peak Flow      Pain Score      Pain Loc      Pain Edu?      Excl. in [place]?    No data found.  Updated Vital Signs BP 122/78 (BP Location: Left Arm)   Pulse 86   Temp 98.2 F (36.8 C) (Oral)   SpO2 97%   Visual Acuity Right Eye Distance:   Left Eye Distance:   Bilateral Distance:    Right Eye Near:   Left Eye Near:    Bilateral Near:     Physical Exam  Constitutional: She is oriented to person, place, and time. She appears well-developed and well-nourished. No distress.  HENT:  Head: Normocephalic and atraumatic.  Right Ear: Hearing, tympanic membrane, external ear and ear canal normal.  Left Ear: Hearing, tympanic membrane, external ear and ear canal normal.  Nose: Rhinorrhea present.  Mouth/Throat: Mucous membranes are normal. No trismus in the jaw. No uvula swelling. Posterior oropharyngeal erythema present. No oropharyngeal exudate, posterior oropharyngeal edema or tonsillar abscesses. No tonsillar exudate.  No frontal or maxillary sinus tenderness.  Eyes: Conjunctivae are normal. Right eye exhibits no discharge. Left eye exhibits no discharge.  Neck: Normal range of motion.  Cardiovascular: Normal rate and regular rhythm.  Pulmonary/Chest: Effort normal. No stridor. No respiratory distress. She has wheezes. She has no rales.  Abdominal: Soft. She exhibits no distension. There is no tenderness.  Musculoskeletal: Normal range of motion. She exhibits no deformity.  Lymphadenopathy:    She has cervical adenopathy.  Neurological: She is alert and oriented to person, place, and time. She has normal reflexes.  Skin: Skin is warm and dry.  Psychiatric: She has a normal mood and affect. Her behavior is normal. Thought content normal.     UC Treatments / Results  Labs (all labs ordered are listed, but only abnormal results are displayed) Labs Reviewed - No data to display  EKG  EKG  Interpretation None       Radiology Dg Chest 2 View  Result Date: 02/17/2017 CLINICAL DATA:  56 year old female with congestion x2 weeks worsening over the past 3  days. EXAM: CHEST  2 VIEW COMPARISON:  06/13/2016 FINDINGS: The heart size and mediastinal contours are within normal limits. Aortic atherosclerosis noted at the arch. Mild bilateral interstitial prominence which may reflect chronic bronchitic change. No alveolar consolidations or CHF. Stable thoracic spondylosis. 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 IMPRESSION: Mild chronic bronchitic change of the lungs without pneumonic consolidation. Electronically Signed   By: Ashley Royalty M.D.   On: 02/17/2017 18:58    Procedures Procedures (including critical care time)  Medications Ordered in UC Medications  ipratropium-albuterol (DUONEB) 0.5-2.5 (3) MG/3ML nebulizer solution 3 mL (3 mLs Nebulization Given 02/17/17 1857)     Initial Impression / Assessment and Plan / UC Course  I have reviewed the triage vital signs and the nursing notes.  Pertinent labs & imaging results that were available during my care of the patient were reviewed by me and considered in my medical decision making (see chart for details).     56 year old female with acute bronchitis.  She is experiencing wheezing and shortness of breath on arrival.  After DuoNeb treatment x1 patient saw significant improvement with complete resolution of wheezing.  She had good air movement bilaterally.  Patient felt significant improvement with DuoNeb treatment.  Patient is given prescription for prednisone 40 mg daily times 5 days along with albuterol prescription, she will use albuterol 1-2 puffs every 6 hours as needed.  She will continue with over-the-counter Mucinex and dextromethorphan as needed.  She will increase fluids.  She is educated on signs and symptoms return to clinic for.  Final Clinical Impressions(s) / UC Diagnoses   Final diagnoses:  Acute  bronchitis, unspecified organism    ED Discharge Orders        Ordered    predniSONE (DELTASONE) 20 MG tablet  Daily     02/17/17 1914    albuterol (PROVENTIL HFA;VENTOLIN HFA) 108 (90 Base) MCG/ACT inhaler  Every 6 hours PRN     02/17/17 1914        Renata Caprice 02/17/17 1922

## 2017-02-17 NOTE — Discharge Instructions (Signed)
Please continue with guaifenesin/Mucinex, drink lots of fluids.  Take prednisone and use albuterol as prescribed.  Return to the clinic for any fevers, worsening cough, shortness of breath or any urgent changes in your health.

## 2017-02-24 ENCOUNTER — Ambulatory Visit: Payer: Self-pay | Admitting: Internal Medicine

## 2017-02-24 ENCOUNTER — Other Ambulatory Visit: Payer: Self-pay

## 2017-02-24 DIAGNOSIS — J40 Bronchitis, not specified as acute or chronic: Secondary | ICD-10-CM

## 2017-02-24 DIAGNOSIS — F17210 Nicotine dependence, cigarettes, uncomplicated: Secondary | ICD-10-CM

## 2017-02-24 DIAGNOSIS — J209 Acute bronchitis, unspecified: Secondary | ICD-10-CM

## 2017-02-24 DIAGNOSIS — J019 Acute sinusitis, unspecified: Secondary | ICD-10-CM

## 2017-02-24 MED ORDER — DEXTROMETHORPHAN POLISTIREX ER 30 MG/5ML PO SUER: 30.0000 mg | ORAL | 0 refills | Status: DC | PRN

## 2017-02-24 MED ORDER — FLUTICASONE PROPIONATE 50 MCG/ACT NA SUSP: 2.0000 | Freq: Every day | NASAL | 2 refills | Status: DC

## 2017-02-24 MED ORDER — BENZONATATE 100 MG PO CAPS: 100.0000 mg | ORAL_CAPSULE | Freq: Three times a day (TID) | ORAL | 1 refills | Status: DC | PRN

## 2017-02-24 NOTE — Progress Notes (Signed)
   CC: here for cough and congestion  HPI:  [name] is a 56 y.o. woman with a past medical history listed below here today for follow up of her cough and congestion for the past few weeks.  She was seen last week at the urgent care.  Given a breathing treatment and discharged home with prednisone x 5 days and albuterol PRN.  Prednisone did not help very much.  She has been using Mucinex pills and Airborne to help with her symptoms without much relief as well.  She feels congested and as if she cannot bring up the mucus in her chest.  She feels that sometimes the congestion "hides and cannot be heard" and "sounds like a rattle".  Symptoms used to be worse at night but now are more persistent.  Denies fever, chills, sick contacts.  Reports cough, SOB, congestion.  Has been without her Flonase for a long period of time.  Reports being on Losartan for about 1 year she thinks.  CXR from urgent care with mild chronic bronchitic changes and no evidence of pneumonia.  For details of today's visit and the status of her chronic medical issues please refer to the assessment and plan.   Past Medical History:  Diagnosis Date  . Allergic rhinitis   . Asthma   . CAD (coronary artery disease)    a. 12/13/10: mRCA 99%, EF 65-70%;  s/p BMS to mRCA  . COPD (chronic obstructive pulmonary disease) ([place])   . Depression   . DM2 (diabetes mellitus, type 2) ([place])   . GERD (gastroesophageal reflux disease)   . HTN (hypertension)   . Hyperlipidemia   . Hyperlipidemia   . Hypothyroidism   . Seasonal allergies   . Tobacco abuse    Review of Systems:  Please see pertinent ROS reviewed in HPI and problem based charting.   Physical Exam:  Vitals:   02/24/17 1433  BP: (!) 153/75  Pulse: 86  Temp: 98.4 F (36.9 C)  TempSrc: Oral  SpO2: 94%  Weight: 257 lb 1.6 oz (116.6 kg)   Physical Exam  Constitutional: She is oriented to person, place, and time and well-developed, well-nourished, and in no distress.   HENT:  Head: Normocephalic and atraumatic.  Mouth/Throat: No oropharyngeal exudate.  Cardiovascular: Normal rate and regular rhythm.  Pulmonary/Chest: Effort normal. No respiratory distress. She has wheezes.  Mild expiratory wheezes  Abdominal: Soft. She exhibits no distension. There is no tenderness.  Neurological: She is alert and oriented to person, place, and time.  Skin: Skin is warm and dry.  Psychiatric: Mood and affect normal.     Assessment & Plan:   See Encounters Tab for problem based charting.  Patient discussed with Dr. Evette Doffing .  Bronchitis Assessment: Patients symptoms consistent with bronchitis likely from viral illness or post nasal drip from sinus congestion.  Recent CXR at the urgent care was reassuring and she is not having further systemic symptoms concerning at this time for bacterial infection.  We will attempt to treat her symptomatically for now and monitor for improvement.  Plan: - Refilled her Flonase nasal spray - Recommended frequent saline nasal irrigation to help with congestion - For her cough, have prescribed a short course of Tessalon pearls as needed and Delsym cough syrup as needed. - Recommended she continue to use albuterol inhaler as needed for wheezing and SOB - Smoking cessation encouraged.

## 2017-02-24 NOTE — Patient Instructions (Signed)
FOLLOW-UP INSTRUCTIONS When: with Dr Philipp Ovens at her next appointment or sooner if your symptoms do not improve For: cough and upper respiratory infection What to bring: current medications  We will try to control your cough better with the following treatment: 1. Continue to use the albuterol inhaler when needed for shortness of breath or wheezing 2.  I have refilled your Flonase 3. Use frequent saline nasal irrigation.  This can be purchased over the counter at the pharmacy 4.  I have prescribed a new cough medicine called Delysm 5.  I have prescribed cough pills called Tessalon pearls

## 2017-02-24 NOTE — Assessment & Plan Note (Addendum)
Assessment: Patients symptoms consistent with bronchitis likely from viral illness or post nasal drip from sinus congestion.  Recent CXR at the urgent care was reassuring and she is not having further systemic symptoms concerning at this time for bacterial infection.  We will attempt to treat her symptomatically for now and monitor for improvement.  Plan: - Refilled her Flonase nasal spray - Recommended frequent saline nasal irrigation to help with congestion - For her cough, have prescribed a short course of Tessalon pearls as needed and Delsym cough syrup as needed. - Recommended she continue to use albuterol inhaler as needed for wheezing and SOB - Smoking cessation encouraged.

## 2017-02-25 NOTE — Progress Notes (Signed)
Internal Medicine Clinic Attending  Case discussed with Dr. Wallace at the time of the visit.  We reviewed the resident's history and exam and pertinent patient test results.  I agree with the assessment, diagnosis, and plan of care documented in the resident's note.  

## 2017-03-09 ENCOUNTER — Other Ambulatory Visit: Payer: Self-pay | Admitting: Internal Medicine

## 2017-03-09 MED ORDER — SERTRALINE HCL 100 MG PO TABS: 150.0000 mg | ORAL_TABLET | Freq: Every day | ORAL | 2 refills | Status: DC

## 2017-03-09 NOTE — Progress Notes (Signed)
Pt called requesting refill of Zoloft. Refilled.

## 2017-04-06 ENCOUNTER — Ambulatory Visit: Payer: Self-pay | Admitting: Internal Medicine

## 2017-04-06 ENCOUNTER — Encounter: Payer: Self-pay | Admitting: Internal Medicine

## 2017-04-06 VITALS — BP 131/80 | HR 83 | Temp 98.3°F | Wt 261.7 lb

## 2017-04-06 DIAGNOSIS — N898 Other specified noninflammatory disorders of vagina: Secondary | ICD-10-CM

## 2017-04-06 DIAGNOSIS — N941 Unspecified dyspareunia: Secondary | ICD-10-CM

## 2017-04-06 LAB — POCT URINALYSIS DIPSTICK
Blood, UA: NEGATIVE
Glucose, UA: NEGATIVE
Ketones, UA: 15
Leukocytes, UA: NEGATIVE
Nitrite, UA: NEGATIVE
Protein, UA: 100
Spec Grav, UA: >=1.03 — AB (ref 1.010–1.025)
Urobilinogen, UA: 1 E.U./dL
pH, UA: 6 (ref 5.0–8.0)

## 2017-04-06 NOTE — Patient Instructions (Signed)
Kendra Adams,  It was a pleasure meeting you today. A referral has been made for gynecology.

## 2017-04-06 NOTE — Progress Notes (Signed)
   CC: dyspareunia  HPI:  [name] is a 57 y.o. female with history noted below that presents to the acute care clinic for dyspareunia that occurred 2 weeks ago. She states that when she had sexual intercourse she experienced cramping in the lower abdomen. Patient reports increased odor in her urine.  She denies dysuria or increased urinary frequency. She denies fever/chills, nausea/vomiting, or hematuria.  Past Medical History:  Diagnosis Date  . Allergic rhinitis   . Asthma   . CAD (coronary artery disease)    a. 12/13/10: mRCA 99%, EF 65-70%;  s/p BMS to mRCA  . COPD (chronic obstructive pulmonary disease) ([place])   . Depression   . DM2 (diabetes mellitus, type 2) ([place])   . GERD (gastroesophageal reflux disease)   . HTN (hypertension)   . Hyperlipidemia   . Hyperlipidemia   . Hypothyroidism   . Seasonal allergies   . Tobacco abuse     Review of Systems:  As noted per history of present illness  Physical Exam:  Vitals:   04/06/17 1414  BP: 131/80  Pulse: 83  Temp: 98.3 F (36.8 C)  TempSrc: Oral  SpO2: 96%  Weight: 261 lb 11.2 oz (118.7 kg)   Physical Exam  Constitutional: She is well-developed, well-nourished, and in no distress.  Genitourinary: Cervix normal. No vaginal discharge found.  Genitourinary Comments: Grapelike cluster at the 12:00 position in vaginal canal     Assessment & Plan:   See encounters tab for problem based medical decision making.   Patient seen with Dr. Daryll Drown

## 2017-04-06 NOTE — Assessment & Plan Note (Addendum)
Assessment: Dyspareunia Patient presents with a 2 week history of painful intercourse. Did a pelvic exam today and noticed a grape like cluster located in the the vaginal canal at the 12:00 position. Patient declined Pap smear today. Will send off for trichomonas, GC, BV and Candida. Urine with no signs of infection.  Will refer to OB/GYN for further evaluation of grape like cluster.  Plan - pelvic exam - urine dipstick - refer to obgyn  - patient received information on cervical cancer screening for uninsured patients

## 2017-04-07 ENCOUNTER — Other Ambulatory Visit: Payer: Self-pay | Admitting: Internal Medicine

## 2017-04-07 LAB — CERVICOVAGINAL ANCILLARY ONLY
Bacterial vaginitis: NEGATIVE
Candida vaginitis: POSITIVE — AB
Chlamydia: NEGATIVE
Neisseria Gonorrhea: NEGATIVE
Trichomonas: NEGATIVE

## 2017-04-07 MED ORDER — FLUCONAZOLE 150 MG PO TABS: 150.0000 mg | ORAL_TABLET | Freq: Once | ORAL | 0 refills | Status: AC

## 2017-04-07 NOTE — Progress Notes (Signed)
Internal Medicine Clinic Attending  I saw and evaluated the patient.  I personally confirmed the key portions of the history and exam documented by Dr. Heber [place] and I reviewed pertinent patient test results.  The assessment, diagnosis, and plan were formulated together and I agree with the documentation in the resident's note.  Lesion was present near the vaginal opening and the 12 o'clock position.  Looked like a cluster of grapes.  Patient reported she has had abnormal paps on 3 occasions, so this could certainly be an internal wart due to HPV.  Would have OB look at it to assess need for biopsy.  Also on differential would be vaginal wall carcinoma, vulvar carcinoma.

## 2017-04-08 ENCOUNTER — Other Ambulatory Visit: Payer: Self-pay | Admitting: Obstetrics and Gynecology

## 2017-04-08 DIAGNOSIS — Z1231 Encounter for screening mammogram for malignant neoplasm of breast: Secondary | ICD-10-CM

## 2017-04-10 ENCOUNTER — Telehealth: Payer: Self-pay | Admitting: Internal Medicine

## 2017-04-10 NOTE — Telephone Encounter (Signed)
PATIENT HAS APPT 2/6/19TO DO NEW APPS FOR GCCN/CAFA

## 2017-04-15 ENCOUNTER — Ambulatory Visit: Payer: Self-pay

## 2017-04-16 ENCOUNTER — Ambulatory Visit
Admission: RE | Admit: 2017-04-16 | Discharge: 2017-04-16 | Disposition: A | Discharge status: Home / Self Care | Payer: No Typology Code available for payment source | Source: Ambulatory Visit | Attending: Obstetrics and Gynecology | Admitting: Obstetrics and Gynecology

## 2017-04-16 ENCOUNTER — Encounter: Payer: Self-pay | Admitting: Pharmacist

## 2017-04-16 ENCOUNTER — Encounter (HOSPITAL_COMMUNITY): Payer: Self-pay

## 2017-04-16 ENCOUNTER — Other Ambulatory Visit: Payer: Self-pay

## 2017-04-16 ENCOUNTER — Ambulatory Visit (HOSPITAL_COMMUNITY)
Admission: RE | Admit: 2017-04-16 | Discharge: 2017-04-16 | Disposition: A | Discharge status: Home / Self Care | Payer: Self-pay | Source: Ambulatory Visit | Attending: Obstetrics and Gynecology | Admitting: Obstetrics and Gynecology

## 2017-04-16 VITALS — BP 118/82 | Ht 63.0 in | Wt 258.4 lb

## 2017-04-16 DIAGNOSIS — N898 Other specified noninflammatory disorders of vagina: Secondary | ICD-10-CM

## 2017-04-16 DIAGNOSIS — Z01419 Encounter for gynecological examination (general) (routine) without abnormal findings: Secondary | ICD-10-CM

## 2017-04-16 DIAGNOSIS — Z1231 Encounter for screening mammogram for malignant neoplasm of breast: Secondary | ICD-10-CM

## 2017-04-16 NOTE — Addendum Note (Signed)
Encounter addended by: Loletta Parish, RN on: 04/16/2017 1:22 PM  Actions taken: Visit diagnoses modified

## 2017-04-16 NOTE — Progress Notes (Signed)
No complaints today.   Pap Smear: Pap smear completed today. Last Pap smear was 2-3 years ago at the free cervical cancer screening at [facility] and normal per patient. Per patient has a history of three abnormal Pap smears when she was in her 20's, 30's, and 40's. Patient is unsure what and if any follow-up was completed for her abnormal Pap smears. Last Pap smear result is in Epic. Previous Pap smear result on11/26/2012 at [facility] is in [place].  Physical exam: Breasts Breasts symmetrical. No skin abnormalities bilateral breasts. No nipple retraction bilateral breasts. No nipple discharge bilateral breasts. No lymphadenopathy. No lumps palpated bilateral breasts. No complaints of pain or tenderness on exam. Referred patient to the [facility] for a screening mammogram. Appointment scheduled for Thursday, April 16, 2017 at [place].  Pelvic/Bimanual   Ext Genitalia No lesions, no swelling and no discharge observed on external genitalia.         Vagina Vagina pink and normal texture. No lesions and thick white discharge observed in vagina. Wet prep completed.         Cervix Cervix is present. Cervix pink and of normal texture. Thick white discharge observed on cervix.    Uterus Uterus is present and palpable. Uterus in normal position and normal size.        Adnexae Bilateral ovaries present and unable to palpated. No tenderness on palpation.          Rectovaginal No rectal exam completed today since patient had no rectal complaints. Hemorrhoid observed.  Smoking History: Patient is a current smoker. Discussed smoking cessation with patient. Referred to the [facility] Quitline and gave resources to the free smoking cessation classes at [facility].  Patient Navigation: Patient education provided. Access to services provided for patient through [facility] program.   Colorectal Cancer Screening: Per patient had a colonoscopy completed in 2011.  Patient completed a FOBT Stool Test 05/16/2015 that was negative. No complaints  Today. FIT Test given to patient to complete and return to [facility].

## 2017-04-16 NOTE — Patient Instructions (Addendum)
Explained breast self awareness with Wilburt Finlay. Let patient know [facility] will cover Pap smears and HPV typing every 5 years unless has a history of abnormal Pap smears. Referred patient to the [facility] for a screening mammogram. Appointment scheduled for Thursday, April 16, 2017 at [place]. Let patient know will follow up with her within the next couple weeks with results of Pap smear and wet prep by phone. Informed patient that the [facility] will follow-up with her within the next couple of weeks with results of mammogram by letter or phone. Discussed smoking cessation with patient. Referred to the [facility] Quitline and gave resources to the free smoking cessation classes at [facility]. Wilburt Finlay verbalized understanding.  Aubrii Sharpless, Arvil Chaco, RN 11:04 AM

## 2017-04-16 NOTE — Addendum Note (Signed)
Encounter addended by: Loletta Parish, RN on: 04/16/2017 12:38 PM  Actions taken: Sign clinical note

## 2017-04-17 ENCOUNTER — Encounter (HOSPITAL_COMMUNITY): Payer: Self-pay | Admitting: *Deleted

## 2017-04-17 ENCOUNTER — Other Ambulatory Visit: Payer: Self-pay | Admitting: *Deleted

## 2017-04-17 DIAGNOSIS — E118 Type 2 diabetes mellitus with unspecified complications: Secondary | ICD-10-CM

## 2017-04-17 LAB — CYTOLOGY - PAP
Bacterial vaginitis: POSITIVE — AB
Candida vaginitis: POSITIVE — AB
Diagnosis: NEGATIVE
HPV: DETECTED — AB
Trichomonas: NEGATIVE

## 2017-04-17 MED ORDER — GLIPIZIDE 5 MG PO TABS
5.0000 mg | ORAL_TABLET | Freq: Every day | ORAL | 4 refills | Status: DC
Start: 2017-04-17 — End: 2017-06-08

## 2017-04-17 NOTE — Telephone Encounter (Signed)
Approved glipizide refill, appointment scheduled 3/11. Tessalon Perles refill declined. Needs to be seen for this - can come to [facility] if having cough or wait until her next appointment. Thanks.

## 2017-04-20 ENCOUNTER — Telehealth (HOSPITAL_COMMUNITY): Payer: Self-pay

## 2017-04-20 ENCOUNTER — Other Ambulatory Visit (HOSPITAL_COMMUNITY): Payer: Self-pay

## 2017-04-20 MED ORDER — METRONIDAZOLE 500 MG PO TABS: 500.0000 mg | ORAL_TABLET | Freq: Two times a day (BID) | ORAL | 0 refills | Status: DC

## 2017-04-20 MED ORDER — FLUCONAZOLE 150 MG PO TABS: 150.0000 mg | ORAL_TABLET | Freq: Once | ORAL | 0 refills | Status: AC

## 2017-04-20 NOTE — Telephone Encounter (Signed)
Phoned patient to discuss patients pap smear results. Discussed the need to repeat her PAP smear in 1 year. Flagyl  531m 1 tab two times daily times 14 days and Diflucan 1548m1 time prescribed due to wet prep results. Patient voiced understanding.

## 2017-04-22 LAB — FECAL OCCULT BLOOD, IMMUNOCHEMICAL: Fecal Occult Bld: NEGATIVE

## 2017-04-28 ENCOUNTER — Ambulatory Visit (HOSPITAL_COMMUNITY)
Admission: RE | Admit: 2017-04-28 | Discharge: 2017-04-28 | Disposition: A | Discharge status: Home / Self Care | Payer: No Typology Code available for payment source | Source: Ambulatory Visit | Attending: Internal Medicine | Admitting: Internal Medicine

## 2017-04-28 ENCOUNTER — Other Ambulatory Visit: Payer: Self-pay

## 2017-04-28 ENCOUNTER — Ambulatory Visit (INDEPENDENT_AMBULATORY_CARE_PROVIDER_SITE_OTHER): Payer: Self-pay | Admitting: Internal Medicine

## 2017-04-28 VITALS — BP 125/72 | HR 80 | Temp 98.3°F | Ht 63.0 in | Wt 261.0 lb

## 2017-04-28 DIAGNOSIS — M47817 Spondylosis without myelopathy or radiculopathy, lumbosacral region: Secondary | ICD-10-CM

## 2017-04-28 DIAGNOSIS — Z6841 Body Mass Index (BMI) 40.0 and over, adult: Secondary | ICD-10-CM

## 2017-04-28 DIAGNOSIS — R1032 Left lower quadrant pain: Secondary | ICD-10-CM | POA: Insufficient documentation

## 2017-04-28 DIAGNOSIS — E669 Obesity, unspecified: Secondary | ICD-10-CM

## 2017-04-28 DIAGNOSIS — M8588 Other specified disorders of bone density and structure, other site: Secondary | ICD-10-CM

## 2017-04-28 MED ORDER — BENZONATATE 100 MG PO CAPS: 100.0000 mg | ORAL_CAPSULE | Freq: Three times a day (TID) | ORAL | 1 refills | Status: DC | PRN

## 2017-04-28 NOTE — Progress Notes (Signed)
   CC: left groin pain  HPI:  [name] is a 57 y.o.  female with a past medical history listed below here today with complaints of left groin pain.  She notes that she has had ongoing mild left groin pain for the past several years.  It has never bothered her that much so she has ignored it.  However, over the past 2-3 months the pain has been progressively worsening.  She notes that after walking a few blocks she develops pain in her left groin area.  She describes the pain as a pulling sensation that radiates to her left hip.  She notes that on occasion she has noticed a lump underneath her pannus on the left.  It has not been painful and comes and goes.  She is worried about possible hernia.  She denies any abdominal pain or issues with bowel movements.  No hematochezia or melena.  No nausea or vomiting.  She denies any history of trauma to her left hip.  Does note symptoms did improve when she took Aleve at home.   Past Medical History:  Diagnosis Date  . Allergic rhinitis   . Asthma   . CAD (coronary artery disease)    a. 12/13/10: mRCA 99%, EF 65-70%;  s/p BMS to mRCA  . COPD (chronic obstructive pulmonary disease) ([place])   . Depression   . DM2 (diabetes mellitus, type 2) ([place])   . GERD (gastroesophageal reflux disease)   . HTN (hypertension)   . Hyperlipidemia   . Hyperlipidemia   . Hypothyroidism   . Seasonal allergies   . Tobacco abuse    Review of Systems:   Negative except as noted in HPI  Physical Exam:  Vitals:   04/28/17 1533  BP: 125/72  Pulse: 80  Temp: 98.3 F (36.8 C)  TempSrc: Oral  SpO2: 95%  Weight: 261 lb (118.4 kg)  Height: 5' 3"  (1.6 m)   GENERAL- alert, co-operative, appears as stated age, not in any distress. HEENT- Atraumatic, normocephalic, PERRL, EOMI, oral mucosa appears moist CARDIAC- RRR, no murmurs, rubs or gallops. RESP- Moving equal volumes of air, and clear to auscultation bilaterally, no wheezes or crackles. ABDOMEN- Soft,  nontender, bowel sounds present.  Mild tenderness in the inguinal region on the left.  No masses appreciated. EXTREMITIES- pulse 2+, symmetric, no pedal edema. SKIN- Warm, dry, No rash or lesion. PSYCH- Normal mood and affect, appropriate thought content and speech.   Assessment & Plan:   See Encounters Tab for problem based charting.  Patient discussed with Dr. Lynnae January

## 2017-04-28 NOTE — Assessment & Plan Note (Signed)
See HPI for full details.  Assessment and plan:  No appreciable hernias on examination today.  CT abdomen from 08/17/2015 without any abnormalities appreciated.  No note of any hernias or other abnormalities noted at that time.  Patient symptoms most concerning for hip pathology.  Given her obesity and chronicity as well as symptoms with exertion would rule out hip osteoarthritis.  Will obtain x-ray today.  Advised NSAID as needed.

## 2017-04-28 NOTE — Patient Instructions (Addendum)
Kendra Adams,  I would like to get an xray of your hip to look for arthritis. You can take Tylenol or Ibuprofen as needed for the pain.   Please follow up with Dr. Philipp Ovens on 3/11.

## 2017-05-01 NOTE — Progress Notes (Signed)
Internal Medicine Clinic Attending  Case discussed with Dr. Charlynn Grimes at the time of the visit.  We reviewed the resident's history and exam and pertinent patient test results.  I agree with the assessment, diagnosis, and plan of care documented in the resident's note. Hip film showed osteopenia, relatively nl hip joint, mild OA B sacroiliac joints, and lumbar spin spondylosis.

## 2017-05-11 ENCOUNTER — Telehealth: Payer: Self-pay | Admitting: Physician Assistant

## 2017-05-11 NOTE — Telephone Encounter (Signed)
I spoke with Kendra Adams about arm/chest pain she had Saturday evening.  States it lasted no more than 2-3 minutes.  She is feeling fine today.  Kendra Adams has appointment with Ermalinda Barrios, PA on 05/13/17. I advised her to keep this appt and to be evaluated in ED if she has problems prior to this appointment.

## 2017-05-11 NOTE — Telephone Encounter (Signed)
New Message     Pt c/o of Chest Pain: STAT if CP now or developed within 24 hours  1. Are you having CP right now? No - Saturday night   2. Are you experiencing any other symptoms (ex. SOB, nausea, vomiting, sweating)? No but she did have pain in her left arm at the same time she was having chest pain   3. How long have you been experiencing CP? It lasted for about 2-3 minutes and then it stopped, it was more pressure than a sharp pain ( patient states she does not smoke or drink, but she does drink a lot of mountain dew)   4. Is your CP continuous or coming and going? It was continuous when it happened then it went away.   5. Have you taken Nitroglycerin?  No -she did not feel the pain was bad enough for her to take one.   ?

## 2017-05-12 NOTE — Progress Notes (Signed)
Cardiology Office Note    Date:  05/13/2017   ID:  [name], DOB [birth year], MRN [id number]  PCP:  [name], MD  Cardiologist: Ena Dawley, MD  Chief Complaint  Patient presents with  . Chest Pain    History of Present Illness:  Kendra Adams is a 57 y.o. female with a history of CAD status post 2 bare-metal stents to the RCA in October 2012, hyperlipidemia, tobacco use, type 2 diabetes and hypertension. Was having chest pain back in 10/2013 and nuclear stress test was normal.   I last saw the patient 02/2015 at which time she was having fullness feeling in her neck but not going into her chest or arm like before.  She was still smoking.  I recommended nuclear stress test but she never had this.  Patient had repeat cardiac catheterization 08/14/15 that showed nonobstructive CAD stent in the RCA widely patent normal LV function.  She was cleared to have cholecystectomy.  Patient called in Monday complaining of chest and arm pain. She feels like something is sitting on her chest and eases in 1-3 min. Occurs at rest  & with exertion. Also had 1 episode with pain into her left arm while laying down. Pain started a couple of months ago, happens 1-3x/week.Also increase in indigestion symptoms for the past 2 months. Smoking about 10 cigarettes/day-trying to quit. 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 Sugars running high b/c she loves sugar and doesn't check labs regularly.    Past Medical History:  Diagnosis Date  . Allergic rhinitis   . Asthma   . CAD (coronary artery disease)    a. 12/13/10: mRCA 99%, EF 65-70%;  s/p BMS to mRCA  . COPD (chronic obstructive pulmonary disease) ([place])   . Depression   . DM2 (diabetes mellitus, type 2) ([place])   . GERD (gastroesophageal reflux disease)   . HTN (hypertension)   . Hyperlipidemia   . Hyperlipidemia   . Hypothyroidism   . Seasonal allergies   . Tobacco abuse     Past Surgical History:    Procedure Laterality Date  . CARDIAC CATHETERIZATION N/A 08/14/2015   Procedure: Left Heart Cath and Coronary Angiography;  Surgeon: Peter M Martinique, MD;  Location: [facility];  Service: Cardiovascular;  Laterality: N/A;  . CARPAL TUNNEL RELEASE     w/ bone spurs  . CHOLECYSTECTOMY N/A 08/15/2015   Procedure: LAPAROSCOPIC CHOLECYSTECTOMY WITH ATTEMPTED INTRAOPERATIVE CHOLANGIOGRAM;  Surgeon: Erroll Luna, MD;  Location: [facility];  Service: General;  Laterality: N/A;  . CORONARY ANGIOPLASTY WITH STENT PLACEMENT    . DILATION AND CURETTAGE OF UTERUS      Current Medications: Current Meds  Medication Sig  . albuterol (PROVENTIL HFA;VENTOLIN HFA) 108 (90 Base) MCG/ACT inhaler Inhale 1-2 puffs into the lungs every 6 (six) hours as needed for wheezing or shortness of breath.  Marland Kitchen aspirin 81 MG chewable tablet Chew 81 mg by mouth every morning.   Marland Kitchen atorvastatin (LIPITOR) 80 MG tablet Take 1 tablet (80 mg total) by mouth daily.  . benzonatate (TESSALON PERLES) 100 MG capsule Take 1 capsule (100 mg total) by mouth 3 (three) times daily as needed for cough.  . betamethasone dipropionate (DIPROLENE) 0.05 % cream Apply topically 2 (two) times daily.  . Blood Glucose Monitoring Suppl DEVI 1 Device by Does not apply route daily before breakfast.  . dextromethorphan (DELSYM) 30 MG/5ML liquid Take 5 mLs (30 mg total) by mouth as  needed for cough.  . fluticasone (FLONASE) 50 MCG/ACT nasal spray Place 2 sprays into both nostrils daily.  Marland Kitchen glipiZIDE (GLUCOTROL) 5 MG tablet Take 1 tablet (5 mg total) by mouth daily.  . Glucose Blood (BLOOD GLUCOSE TEST STRIPS) STRP 1 strip by In Vitro route daily before breakfast.  . Insulin Pen Needle (BD PEN NEEDLE NANO U/F) 32G X 4 MM MISC 1 Units/day by Does not apply route daily.  . Lancets MISC 1 Units by Does not apply route daily.  Marland Kitchen levothyroxine (LEVOTHROID) 25 MCG tablet Take 0.5 tablets (12.5 mcg total) by mouth daily. In addition to the 133mg tablet (total  162.556m daily)  . levothyroxine (SYNTHROID, LEVOTHROID) 150 MCG tablet TAKE ONE TABLET BY MOUTH ONCE DAILY BEFORE  BREAKFAST (Patient taking differently: TAKE 150 MCG BY MOUTH ONCE DAILY BEFORE  BREAKFAST)  . losartan (COZAAR) 25 MG tablet Take 1 tablet (25 mg total) by mouth daily.  . metFORMIN (GLUCOPHAGE) 1000 MG tablet Take 1 tablet (1,000 mg total) by mouth 2 (two) times daily.  . metoprolol succinate (TOPROL-XL) 100 MG 24 hr tablet TAKE ONE TABLET BY MOUTH ONCE DAILY WITH  OR  IMMEDIATELY  FOLLOWING  A  MEAL  . metroNIDAZOLE (FLAGYL) 500 MG tablet Take 1 tablet (500 mg total) by mouth 2 (two) times daily.  . nitroGLYCERIN (NITROSTAT) 0.4 MG SL tablet Place 1 tablet (0.4 mg total) under the tongue every 5 (five) minutes as needed for chest pain.  . Omega-3 Fatty Acids (FISH OIL) 1000 MG CAPS Take 1 capsule by mouth every morning.   . Marland Kitchenmeprazole (PRILOSEC) 40 MG capsule Take 1 capsule (40 mg total) by mouth daily.  . Marland KitchenOPINIRole (REQUIP) 1 MG tablet Take 1 tablet (1 mg total) by mouth at bedtime.  . sertraline (ZOLOFT) 100 MG tablet Take 1.5 tablets (150 mg total) by mouth daily.  . Marland Kitchenpacer/Aero-Holding Chambers (AEROCHAMBER PLUS) inhaler Use as instructed  . triamcinolone cream (KENALOG) 0.1 % Apply 1 application topically 2 (two) times daily. (Patient taking differently: Apply 1 application topically 3 (three) times daily as needed (itching). )  . [DISCONTINUED] nitroGLYCERIN (NITROSTAT) 0.4 MG SL tablet Place 1 tablet (0.4 mg total) under the tongue every 5 (five) minutes as needed for chest pain.     Allergies:   Gabapentin; Nsaids; Opium; and Tramadol hcl   Social History   Socioeconomic History  . Marital status: Married    Spouse name: None  . Number of children: 0  . Years of education: 10th grade  . Highest education level: None  Social Needs  . Financial resource strain: None  . Food insecurity - worry: None  . Food insecurity - inability: None  . Transportation needs -  medical: None  . Transportation needs - non-medical: None  Occupational History  . Occupation: laConservation officer, [employer]  Comment: self-employed  . 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 Smoking status: Current Every Day Smoker    Packs/day: 0.40    Years: 40.00    Pack years: 16.00    Types: Cigarettes    Last attempt to quit: 05/19/2014    Years since quitting: 2.9  . Smokeless tobacco: Never Used  . Tobacco comment: Trying to quit again-cutting back  Substance and Sexual Activity  . Alcohol use: No    Alcohol/week: 0.0 oz  . Drug use: Yes    Frequency: 7.0 times per week    Types: Marijuana    Comment:  Marijuana Use  . Sexual activity: Yes  Other Topics Concern  . None  Social History Narrative   Father died of suicide (gunshot) in 1996/06/03. 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       Patient lives with her husband, her husband's niece Tammy who is mentally retarded and her best friend and the friend's daughter and 6 dogs                 Family History:  The patient's family history includes Diabetes in her mother; Hypertension in her mother.   ROS:   Please see the history of present illness.    Review of Systems  Cardiovascular: Positive for chest pain and dyspnea on exertion.  Respiratory: Positive for cough and shortness of breath.   Musculoskeletal: Positive for back pain and myalgias.  Gastrointestinal: Positive for abdominal pain, flatus and heartburn.  Neurological: Positive for dizziness.  Psychiatric/Behavioral: Positive for depression.   All other systems reviewed and are negative.   PHYSICAL EXAM:   VS:  BP 140/85 (BP Location: Left Arm, Patient Position: Sitting, Cuff Size: Normal)   Pulse 85   Ht 5' 3"  (1.6 m)   Wt 259 lb (117.5 kg)   SpO2 93%   BMI 45.88 kg/m   Physical Exam  GEN: Obese, in no acute distress  Neck: no JVD, carotid bruits,  or masses Cardiac:RRR; distant heart sounds no murmurs, rubs, or gallops  Respiratory:  clear to auscultation bilaterally, normal work of breathing GI: soft, nontender, nondistended, + BS Ext: without cyanosis, clubbing, or edema, Good distal pulses bilaterally Neuro:  Alert and Oriented x 3,  Psych: euthymic mood, full affect  Wt Readings from Last 3 Encounters:  05/13/17 259 lb (117.5 kg)  04/28/17 261 lb (118.4 kg)  04/16/17 258 lb 6.4 oz (117.2 kg)      Studies/Labs Reviewed:   EKG:  EKG is ordered today.  The ekg ordered today demonstrates normal sinus rhythm, normal EKG  Recent Labs: 06/23/2016: TSH 1.280   Lipid Panel    Component Value Date/Time   CHOL 203 (H) 02/26/2015 1533   TRIG 198 (H) 02/26/2015 1533   HDL 31 (L) 02/26/2015 1533   CHOLHDL 6.5 (H) 02/26/2015 1533   CHOLHDL 5.4 02/11/2014 0852   VLDL 28 02/11/2014 0852   LDLCALC 132 (H) 02/26/2015 1533   LDLDIRECT 142.4 03/21/2011 0843    Additional studies/ records that were reviewed today include:  Cardiac cath 08/2015  Conclusion    Ost Cx to Prox Cx lesion, 10% stenosed.  The left ventricular systolic function is normal.   1. Nonobstructive CAD. The stent in the RCA is widely patent. 2. Normal LV function   Plan: she is clear to proceed with cholecystectomy from a cardiac standpoint.      Conclusion    Ost Cx to Prox Cx lesion, 10% stenosed.  The left ventricular systolic function is normal.   1. Nonobstructive CAD. The stent in the RCA is widely patent. 2. Normal LV function   Plan: she is clear to proceed with cholecystectomy from a cardiac standpoint.      FINAL ASSESSMENT:  Successful stenting of the mid right coronary artery with a bare-metal stent.   PLAN:  Continue on aspirin and Plavix.             ______________________________ Peter M. Martinique, M.D.  PMJ/MEDQ  D:  12/13/2010  T:  12/14/2010  Job:   Nuclear stress test 10/2013   Impression Exercise Capacity:   Poor exercise capacity. BP Response:  Normal blood pressure response. Patient was hypotensive during Stage 1, then became hypertensive. Clinical Symptoms:  Significant dyspnea ECG Impression:  No significant ST segment change suggestive of ischemia. Comparison with Prior Nuclear Study: No previous nuclear study performed   Overall Impression:  Low risk stress nuclear study with fixed inferior, apical and anterior attenuation artifacts despite 2 day imaging. Poor exercise capacity..   LV Ejection Fraction: 53%.  LV Wall Motion:  NL LV Function; NL Wall Motion         ASSESSMENT:    1. Chest pain, unspecified type   2. Coronary artery disease due to lipid rich plaque   3. Hypertension associated with diabetes ([place])   4. Tobacco use disorder   5. Type 2 diabetes mellitus with complication, without long-term current use of insulin ([place])   6. Hyperlipidemia, unspecified hyperlipidemia type   7. Morbid obesity ([place])   8. Coronary artery disease involving native coronary artery of native heart without angina pectoris      PLAN:  In order of problems listed above:  Chest pain with typical and atypical features occurring at random times.  Also having GI symptoms.  She does have history of CAD and significant risk factors including uncontrolled diabetes and ongoing cigarette abuse.  Recommend exercise Myoview 2-day protocol.  She is not sure how far she can walk so she may need to be switched to [place].  Refill nitroglycerin follow-up with Dr. Meda Coffee in 6 months unless stress test abnormal.  CAD status post BMS x2 to the RCA 2012, last cath 08/2015 patent stents, nonobstructive disease with normal LV function now with recurrent chest pain  Hypertension blood pressure stable  Tobacco abuse patient is down to 10 cigarettes daily and trying to quit.  Importance of smoking cessation discussed  Type 2 diabetes mellitus with elevated blood sugars.  Patient says she loves to eat sugar and is  trying to do better but she does not check her sugars daily.  When she does a running high.  Last hemoglobin A1c 7.59/2018 discussed importance of diabetes control and daily monitoring.  Hyperlipidemia patient is on Lipitor 80 mg daily.  Has not had a fasting lipid panel since 2015 that I can find.  Will recheck when she comes back for her stress test  Morbid obesity exercise and weight loss program essential.  Med ication Adjustments/Labs and Tests Ordered: Current medicines are reviewed at length with the patient today.  Concerns regarding medicines are outlined above.  Medication changes, Labs and Tests ordered today are listed in the Patient Instructions below. Patient Instructions  Medication Instructions: Your physician recommends that you continue on your current medications as directed. Please refer to the Current Medication list given to you today.  Labwork: Your physician recommends that you return for lab work on the same day as your stress test : Fasting Lipid Panel   Procedures/Testing: Your physician has requested that you have en exercise stress myoview. For further information please visit [email or web address]. Please follow instruction sheet, as given.  Follow-Up: Your physician wants you to follow-up in: 6 MONTHS with Dr. Meda Coffee.  You will receive a reminder letter in the mail two months in advance. If you don't receive a letter, please call our office to schedule the follow-up appointment.    If you need a refill on your  cardiac medications before your next appointment, please call your pharmacy.      Sumner Boast, PA-C  05/13/2017 8:58 AM    [facility] [place], [place], [place]  [address] Phone: [phone]; Fax: [phone]

## 2017-05-13 ENCOUNTER — Ambulatory Visit (INDEPENDENT_AMBULATORY_CARE_PROVIDER_SITE_OTHER): Payer: Self-pay | Admitting: Physician Assistant

## 2017-05-13 ENCOUNTER — Encounter: Payer: Self-pay | Admitting: Physician Assistant

## 2017-05-13 VITALS — BP 140/85 | HR 85 | Ht 63.0 in | Wt 259.0 lb

## 2017-05-13 DIAGNOSIS — I2510 Atherosclerotic heart disease of native coronary artery without angina pectoris: Secondary | ICD-10-CM

## 2017-05-13 DIAGNOSIS — I152 Hypertension secondary to endocrine disorders: Secondary | ICD-10-CM

## 2017-05-13 DIAGNOSIS — E118 Type 2 diabetes mellitus with unspecified complications: Secondary | ICD-10-CM

## 2017-05-13 DIAGNOSIS — I2583 Coronary atherosclerosis due to lipid rich plaque: Secondary | ICD-10-CM

## 2017-05-13 DIAGNOSIS — R079 Chest pain, unspecified: Secondary | ICD-10-CM

## 2017-05-13 DIAGNOSIS — F17200 Nicotine dependence, unspecified, uncomplicated: Secondary | ICD-10-CM

## 2017-05-13 DIAGNOSIS — I10 Essential (primary) hypertension: Secondary | ICD-10-CM

## 2017-05-13 DIAGNOSIS — E1159 Type 2 diabetes mellitus with other circulatory complications: Secondary | ICD-10-CM

## 2017-05-13 DIAGNOSIS — E785 Hyperlipidemia, unspecified: Secondary | ICD-10-CM

## 2017-05-13 MED ORDER — NITROGLYCERIN 0.4 MG SL SUBL: 0.4000 mg | SUBLINGUAL_TABLET | SUBLINGUAL | 3 refills | Status: DC | PRN

## 2017-05-13 NOTE — Patient Instructions (Addendum)
Medication Instructions: Your physician recommends that you continue on your current medications as directed. Please refer to the Current Medication list given to you today.  Labwork: Your physician recommends that you return for lab work on the same day as your stress test : Fasting Lipid Panel   Procedures/Testing: Your physician has requested that you have en exercise stress myoview. For further information please visit [email or web address]. Please follow instruction sheet, as given.  Follow-Up: Your physician wants you to follow-up in: 6 MONTHS with Dr. Meda Coffee.  You will receive a reminder letter in the mail two months in advance. If you don't receive a letter, please call our office to schedule the follow-up appointment.    If you need a refill on your cardiac medications before your next appointment, please call your pharmacy.

## 2017-05-18 ENCOUNTER — Encounter: Payer: Self-pay | Admitting: Internal Medicine

## 2017-05-20 ENCOUNTER — Telehealth (HOSPITAL_COMMUNITY): Payer: Self-pay | Admitting: *Deleted

## 2017-05-20 NOTE — Telephone Encounter (Signed)
Left message on voicemail in reference to upcoming appointment scheduled for 05/25/17. Phone number given for a call back so details instructions can be given.  Kendra Adams

## 2017-05-25 ENCOUNTER — Other Ambulatory Visit: Payer: Self-pay

## 2017-05-25 ENCOUNTER — Ambulatory Visit (HOSPITAL_COMMUNITY): Payer: Self-pay | Attending: Cardiovascular Disease

## 2017-05-25 ENCOUNTER — Other Ambulatory Visit: Payer: Self-pay | Admitting: Physician Assistant

## 2017-05-25 DIAGNOSIS — R079 Chest pain, unspecified: Secondary | ICD-10-CM | POA: Insufficient documentation

## 2017-05-25 LAB — LIPID PANEL
Chol/HDL Ratio: 4 ratio (ref 0.0–4.4)
Cholesterol, Total: 123 mg/dL (ref 100–199)
HDL: 31 mg/dL — ABNORMAL LOW (ref >39)
LDL Calculated: 59 mg/dL (ref 0–99)
Triglycerides: 164 mg/dL — ABNORMAL HIGH (ref 0–149)
VLDL Cholesterol Cal: 33 mg/dL (ref 5–40)

## 2017-05-25 MED ORDER — TECHNETIUM TC 99M TETROFOSMIN IV KIT
32.8000 | PACK | Freq: Once | INTRAVENOUS | Status: AC | PRN
  Administered 2017-05-25: 32.8 via INTRAVENOUS
  Filled 2017-05-25: qty 33

## 2017-05-26 ENCOUNTER — Ambulatory Visit (HOSPITAL_COMMUNITY): Payer: Self-pay | Attending: Physician Assistant

## 2017-05-26 LAB — MYOCARDIAL PERFUSION IMAGING
Estimated workload: 4.6 METS
Exercise duration (min): 4 min
Exercise duration (sec): 0 s
LV dias vol: 123 mL (ref 46–106)
LV sys vol: 54 mL
MPHR: 162 {beats}/min
Peak HR: 155 {beats}/min
Percent HR: 95 %
RATE: 0.34
Rest HR: 99 {beats}/min
SDS: 2
SRS: 9
SSS: 11
TID: 0.86

## 2017-05-26 MED ORDER — TECHNETIUM TC 99M TETROFOSMIN IV KIT
33.0000 | PACK | Freq: Once | INTRAVENOUS | Status: AC | PRN
  Administered 2017-05-26: 33 via INTRAVENOUS
  Filled 2017-05-26: qty 33

## 2017-05-27 ENCOUNTER — Encounter: Payer: Self-pay | Admitting: Family Medicine

## 2017-05-27 ENCOUNTER — Ambulatory Visit: Payer: Self-pay | Admitting: Family Medicine

## 2017-05-27 ENCOUNTER — Encounter: Payer: Self-pay | Admitting: Obstetrics and Gynecology

## 2017-05-27 DIAGNOSIS — M25559 Pain in unspecified hip: Secondary | ICD-10-CM | POA: Insufficient documentation

## 2017-05-27 DIAGNOSIS — M25552 Pain in left hip: Secondary | ICD-10-CM

## 2017-05-27 DIAGNOSIS — M25551 Pain in right hip: Secondary | ICD-10-CM | POA: Insufficient documentation

## 2017-05-27 NOTE — Progress Notes (Signed)
PCP: [name], MD  Chief Complaint:  Chief Complaint  Patient presents with  . Dyspareunia  . Urinary Incontinence    History of Present Illness: Patient is a 57 y.o. G2P0020 presents for annual exam. The patient complains of anterior bilateral hip pain for which she had hip imaging in February that showed arthritis. She has occasional vaginal dryness and pain with intercourse.   The patient is sexually active. She currently uses None for contraception. She has dyspareunia.  The patient does not perform self breast exams.  There is no notable family history of breast or ovarian cancer in her family.   Review of Systems: All ROS negative except for in HPI  Past Medical History:  Past Medical History:  Diagnosis Date  . Allergic rhinitis   . Asthma   . CAD (coronary artery disease)    a. 12/13/10: mRCA 99%, EF 65-70%;  s/p BMS to mRCA  . COPD (chronic obstructive pulmonary disease) ([place])   . Depression   . DM2 (diabetes mellitus, type 2) ([place])   . GERD (gastroesophageal reflux disease)   . HTN (hypertension)   . Hyperlipidemia   . Hyperlipidemia   . Hypothyroidism   . Seasonal allergies   . Tobacco abuse     Past Surgical History:  Past Surgical History:  Procedure Laterality Date  . CARDIAC CATHETERIZATION N/A 08/14/2015   Procedure: Left Heart Cath and Coronary Angiography;  Surgeon: Peter M Martinique, MD;  Location: [facility];  Service: Cardiovascular;  Laterality: N/A;  . CARPAL TUNNEL RELEASE     w/ bone spurs  . CHOLECYSTECTOMY N/A 08/15/2015   Procedure: LAPAROSCOPIC CHOLECYSTECTOMY WITH ATTEMPTED INTRAOPERATIVE CHOLANGIOGRAM;  Surgeon: Erroll Luna, MD;  Location: [facility];  Service: General;  Laterality: N/A;  . CORONARY ANGIOPLASTY WITH STENT PLACEMENT    . DILATION AND CURETTAGE OF UTERUS      Gynecologic History:  No LMP recorded. Patient is postmenopausal. Contraception: none  Obstetric History: G2P0020  Family History:  Family History   Problem Relation Age of Onset  . Diabetes Mother   . Hypertension Mother     Social History:  Social History   Socioeconomic History  . Marital status: Married    Spouse name: Not on file  . Number of children: 0  . Years of education: 10th grade  . Highest education level: Not on file  Social Needs  . Financial resource strain: Not on file  . Food insecurity - worry: Not on file  . Food insecurity - inability: Not on file  . Transportation needs - medical: Not on file  . Transportation needs - non-medical: Not on file  Occupational History  . Occupation: Conservation officer, [employer]    Comment: self-employed  . 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 Smoking status: Current Every Day Smoker    Packs/day: 0.40    Years: 40.00    Pack years: 16.00    Types: Cigarettes    Last attempt to quit: 05/19/2014    Years since quitting: 3.0  . Smokeless tobacco: Never Used  . Tobacco comment: Trying to quit again-cutting back  Substance and Sexual Activity  . Alcohol use: No    Alcohol/week: 0.0 oz  . Drug use: Yes    Frequency: 7.0 times per week    Types: Marijuana    Comment: Marijuana Use  . Sexual activity: Yes  Other Topics Concern  . Not on file  Social History Narrative  Father died of suicide (gunshot) in 27. 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       Patient lives with her husband, her husband's niece Tammy who is mentally retarded and her best friend and the friend's daughter and 6 dogs                Allergies:  Allergies  Allergen Reactions  . Gabapentin     Skin rash, lip swelling  . Nsaids     IBU - Rectal bleeds; states ASA & Tylenol OK  . Opium Nausea And Vomiting  . Tramadol Hcl     "Just doesn't do anything for me"    Medications: Prior to Admission medications   Medication Sig Start Date End Date Taking? Authorizing Provider   albuterol (PROVENTIL HFA;VENTOLIN HFA) 108 (90 Base) MCG/ACT inhaler Inhale 1-2 puffs into the lungs every 6 (six) hours as needed for wheezing or shortness of breath. 02/17/17  Yes Duanne Guess, PA-C  aspirin 81 MG chewable tablet Chew 81 mg by mouth every morning.    Yes [provider]  atorvastatin (LIPITOR) 80 MG tablet Take 1 tablet (80 mg total) by mouth daily. 11/24/16  Yes [name], MD  benzonatate (TESSALON PERLES) 100 MG capsule Take 1 capsule (100 mg total) by mouth 3 (three) times daily as needed for cough. 04/28/17 04/28/18 Yes Maryellen Pile, MD  betamethasone dipropionate (DIPROLENE) 0.05 % cream Apply topically 2 (two) times daily. 12/31/16  Yes [name], MD  dextromethorphan (DELSYM) 30 MG/5ML liquid Take 5 mLs (30 mg total) by mouth as needed for cough. 02/24/17  Yes Jule Ser, DO  fluticasone (FLONASE) 50 MCG/ACT nasal spray Place 2 sprays into both nostrils daily. 02/24/17  Yes Jule Ser, DO  glipiZIDE (GLUCOTROL) 5 MG tablet Take 1 tablet (5 mg total) by mouth daily. 04/17/17 04/17/18 Yes [name], MD  levothyroxine (LEVOTHROID) 25 MCG tablet Take 0.5 tablets (12.5 mcg total) by mouth daily. In addition to the 120mg tablet (total 162.560m daily) 06/23/16 06/23/17 Yes GuVelna OchsMD  levothyroxine (SYNTHROID, LEVOTHROID) 150 MCG tablet TAKE ONE TABLET BY MOUTH ONCE DAILY BEFORE  BREAKFAST Patient taking differently: TAKE 150 MCG BY MOUTH ONCE DAILY BEFORE  BREAKFAST 05/29/16  Yes GuVelna OchsMD  Blood Glucose Monitoring Suppl DEVI 1 Device by Does not apply route daily before breakfast. 05/23/14   Emokpae, Ejiroghene E, MD  Glucose Blood (BLOOD GLUCOSE TEST STRIPS) STRP 1 strip by In Vitro route daily before breakfast. 05/23/14   Emokpae, Ejiroghene E, MD  Insulin Pen Needle (BD PEN NEEDLE NANO U/F) 32G X 4 MM MISC 1 Units/day by Does not apply route daily. 02/26/15   FlLoleta ChanceMD  Lancets MISC 1 Units by Does not apply  route daily. 05/23/14   Emokpae, Ejiroghene E, MD  losartan (COZAAR) 25 MG tablet Take 1 tablet (25 mg total) by mouth daily. 11/24/16   GuVelna OchsMD  metFORMIN (GLUCOPHAGE) 1000 MG tablet Take 1 tablet (1,000 mg total) by mouth 2 (two) times daily. 11/24/16   GuVelna OchsMD  metoprolol succinate (TOPROL-XL) 100 MG 24 hr tablet TAKE ONE TABLET BY MOUTH ONCE DAILY WITH  OR  IMMEDIATELY  FOLLOWING  A  MEAL 11/24/16   GuVelna OchsMD  metroNIDAZOLE (FLAGYL) 500 MG tablet Take 1 tablet (500 mg total) by mouth 2 (two) times daily. 04/20/17   Constant,  Vickii Chafe, MD  nitroGLYCERIN (NITROSTAT) 0.4 MG SL tablet Place 1 tablet (0.4 mg total) under the tongue every 5 (five) minutes as needed for chest pain. 05/13/17   [name], PA-C  Omega-3 Fatty Acids (FISH OIL) 1000 MG CAPS Take 1 capsule by mouth every morning.     [provider]  omeprazole (PRILOSEC) 40 MG capsule Take 1 capsule (40 mg total) by mouth daily. 11/24/16   [name], MD  rOPINIRole (REQUIP) 1 MG tablet Take 1 tablet (1 mg total) by mouth at bedtime. 11/24/16   [name], MD  sertraline (ZOLOFT) 100 MG tablet Take 1.5 tablets (150 mg total) by mouth daily. 03/09/17   Lucious Groves, DO  Spacer/Aero-Holding Chambers (AEROCHAMBER PLUS) inhaler Use as instructed 02/03/15   Melynda Ripple, MD  triamcinolone cream (KENALOG) 0.1 % Apply 1 application topically 2 (two) times daily. Patient taking differently: Apply 1 application topically 3 (three) times daily as needed (itching).  02/25/16   [name], MD    Physical Exam Vitals: Blood pressure 135/80, pulse 80, weight 264 lb 4.8 oz (119.9 kg).  CONSTITUTIONAL: Well-developed, well-nourished female in no acute distress.  EYES: EOM intact, conjunctivae normal, no scleral icterus HEAD: Normocephalic, atraumatic ENT: External right and left ear normal, oropharynx is clear and moist. CARDIOVASCULAR: No cyanosis or edema. 2+ distal pulses.   RESPIRATORY:Effort and breath sounds normal, no problems with respiration noted. GASTROINTESTINAL:Soft, normal bowel sounds, no distention noted.  No tenderness, rebound or guarding.  MUSCULOSKELETAL: Normal range of motion. No tenderness. SKIN: Skin is warm and dry. No rash noted. Not diaphoretic. No erythema. No pallor. [place]: Alert and oriented to person, place, and time. Normal reflexes, muscle tone, coordination. No cranial nerve deficit noted. PSYCHIATRIC: Normal mood and affect. Normal behavior. Normal judgment and thought content. HEM/LYMPH/IMMUNOLOGIC: Neck supple, no masses.  Normal thyroid.      Assessment: 57 y.o. G2P0020 here for bilateral hip pain that is likely due to arthritis.  Plan: Advised NSAID for hip pain As needed vaginal lubricant during sex

## 2017-05-27 NOTE — Progress Notes (Signed)
Pt c/o pain over ovaries intermittently, difficulty with intercourse, and some stress incontinence. States hx fibroids. Elevated PHQ-9 and GAD-7, takes zoloft, declines to see [facility].

## 2017-05-29 ENCOUNTER — Telehealth: Payer: Self-pay | Admitting: Physician Assistant

## 2017-05-29 NOTE — Telephone Encounter (Signed)
New message    Patient returning call from yesterday for lab results

## 2017-05-29 NOTE — Telephone Encounter (Signed)
Lpmtcb 3/22 md

## 2017-05-29 NOTE — Telephone Encounter (Signed)
Lpmtcb 3/22 md  pertaining to labs and recommendations.Marland KitchenMarland Kitchen

## 2017-06-01 ENCOUNTER — Other Ambulatory Visit: Payer: Self-pay | Admitting: *Deleted

## 2017-06-01 DIAGNOSIS — Z8669 Personal history of other diseases of the nervous system and sense organs: Secondary | ICD-10-CM

## 2017-06-01 NOTE — Telephone Encounter (Signed)
Notes recorded by [name], PA-C on 05/26/2017 at 7:56 AM EDT Triglycerides elevated. Recommend vascepa 1 gm 2 tablets twice daily other lipids look good  Contains abnormal data Lipid panel  Order: [id number]  Status:  Final result Visible to patient:  Yes (MyChart) Next appt:  06/08/2017 at 03:45 PM in Internal Medicine [name], MD)   Ref Range & Units 7d ago 56yrago 350yrgo [id number]  Cholesterol, Total 100 - 199 mg/dL 123  203High      Triglycerides 0 - 149 mg/dL 164High   198High   141 R 211High  R  HDL >39 mg/dL 31Low   31Low   28Low   33Low    VLDL Cholesterol Cal 5 - 40 mg/dL 33  40     LDL Calculated 0 - 99 mg/dL 59  132High   96 CM 82 CM  Chol/HDL Ratio 0.0 - 4.4 ratio 4.0  6.5High  R, CM 5.4 R 4.8 R

## 2017-06-01 NOTE — Telephone Encounter (Signed)
Left message to c/b to discuss lab results.

## 2017-06-02 ENCOUNTER — Encounter: Payer: Self-pay | Admitting: *Deleted

## 2017-06-02 MED ORDER — ROPINIROLE HCL 1 MG PO TABS: 1.0000 mg | ORAL_TABLET | Freq: Every day | ORAL | 5 refills | Status: DC

## 2017-06-02 MED ORDER — SERTRALINE HCL 100 MG PO TABS: 150.0000 mg | ORAL_TABLET | Freq: Every day | ORAL | 2 refills | Status: DC

## 2017-06-02 NOTE — Telephone Encounter (Signed)
Follow up  Pt returning call for nurse

## 2017-06-02 NOTE — Telephone Encounter (Signed)
lpmtcb about labs 3/26 md

## 2017-06-08 ENCOUNTER — Telehealth: Payer: Self-pay | Admitting: Physician Assistant

## 2017-06-08 ENCOUNTER — Other Ambulatory Visit: Payer: Self-pay

## 2017-06-08 ENCOUNTER — Ambulatory Visit (INDEPENDENT_AMBULATORY_CARE_PROVIDER_SITE_OTHER): Payer: Self-pay | Admitting: Internal Medicine

## 2017-06-08 ENCOUNTER — Encounter: Payer: Self-pay | Admitting: Internal Medicine

## 2017-06-08 VITALS — BP 148/95 | HR 78 | Temp 98.4°F | Ht 63.0 in | Wt 265.5 lb

## 2017-06-08 DIAGNOSIS — F17200 Nicotine dependence, unspecified, uncomplicated: Secondary | ICD-10-CM

## 2017-06-08 DIAGNOSIS — Z79890 Hormone replacement therapy (postmenopausal): Secondary | ICD-10-CM

## 2017-06-08 DIAGNOSIS — E038 Other specified hypothyroidism: Secondary | ICD-10-CM

## 2017-06-08 DIAGNOSIS — Z79899 Other long term (current) drug therapy: Secondary | ICD-10-CM

## 2017-06-08 DIAGNOSIS — L209 Atopic dermatitis, unspecified: Secondary | ICD-10-CM

## 2017-06-08 DIAGNOSIS — E119 Type 2 diabetes mellitus without complications: Secondary | ICD-10-CM

## 2017-06-08 DIAGNOSIS — E039 Hypothyroidism, unspecified: Secondary | ICD-10-CM

## 2017-06-08 DIAGNOSIS — I10 Essential (primary) hypertension: Secondary | ICD-10-CM

## 2017-06-08 DIAGNOSIS — E785 Hyperlipidemia, unspecified: Secondary | ICD-10-CM

## 2017-06-08 DIAGNOSIS — I2510 Atherosclerotic heart disease of native coronary artery without angina pectoris: Secondary | ICD-10-CM

## 2017-06-08 DIAGNOSIS — Z7984 Long term (current) use of oral hypoglycemic drugs: Secondary | ICD-10-CM

## 2017-06-08 DIAGNOSIS — E118 Type 2 diabetes mellitus with unspecified complications: Secondary | ICD-10-CM

## 2017-06-08 LAB — GLUCOSE, CAPILLARY: Glucose-Capillary: 130 mg/dL — ABNORMAL HIGH (ref 65–99)

## 2017-06-08 LAB — POCT GLYCOSYLATED HEMOGLOBIN (HGB A1C): Hemoglobin A1C: 8.2

## 2017-06-08 MED ORDER — BETAMETHASONE DIPROPIONATE 0.05 % EX CREA: TOPICAL_CREAM | Freq: Two times a day (BID) | CUTANEOUS | 2 refills | Status: DC

## 2017-06-08 MED ORDER — GLIPIZIDE 10 MG PO TABS
10.0000 mg | ORAL_TABLET | Freq: Every day | ORAL | 1 refills | Status: DC
Start: 2017-06-08 — End: 2017-08-17

## 2017-06-08 MED ORDER — LOSARTAN POTASSIUM 50 MG PO TABS: 50.0000 mg | ORAL_TABLET | Freq: Every day | ORAL | 1 refills | Status: DC

## 2017-06-08 MED ORDER — BUPROPION HCL ER (SR) 150 MG PO TB12: 150.0000 mg | ORAL_TABLET | Freq: Two times a day (BID) | ORAL | 2 refills | Status: DC

## 2017-06-08 MED ORDER — METFORMIN HCL 1000 MG PO TABS: 1000.0000 mg | ORAL_TABLET | Freq: Two times a day (BID) | ORAL | 5 refills | Status: DC

## 2017-06-08 MED ORDER — METOPROLOL SUCCINATE ER 100 MG PO TB24: ORAL_TABLET | ORAL | 0 refills | Status: DC

## 2017-06-08 NOTE — Assessment & Plan Note (Signed)
Refilled betamethasone BID prn.

## 2017-06-08 NOTE — Progress Notes (Signed)
   CC: HTN, DM follow up  HPI:  [name] is a 57 y.o. female with past medical history outlined below here for HTN and DM follow up. For the details of today's visit, please refer to the assessment and plan.  Past Medical History:  Diagnosis Date  . Allergic rhinitis   . Asthma   . CAD (coronary artery disease)    a. 12/13/10: mRCA 99%, EF 65-70%;  s/p BMS to mRCA  . COPD (chronic obstructive pulmonary disease) ([place])   . Depression   . DM2 (diabetes mellitus, type 2) ([place])   . GERD (gastroesophageal reflux disease)   . HTN (hypertension)   . Hyperlipidemia   . Hyperlipidemia   . Hypothyroidism   . Seasonal allergies   . Tobacco abuse     Review of Systems  Respiratory: Negative for shortness of breath.   Cardiovascular: Negative for chest pain.    Physical Exam:  Vitals:   06/08/17 1550 06/08/17 1626  BP: (!) 155/75 (!) 148/95  Pulse: 79 78  Temp: 98.4 F (36.9 C)   TempSrc: Oral   SpO2: 99%   Weight: 265 lb 8 oz (120.4 kg)   Height: 5' 3"  (1.6 m)     Constitutional: NAD, appears comfortable Cardiovascular: RRR, no murmurs, rubs, or gallops.  Pulmonary/Chest: CTAB, no wheezes, rales, or rhonchi.  Extremities: Warm and well perfused. No edema.  Skin: Diffuse atopic dermatitis  Psychiatric: Normal mood and affect  Assessment & Plan:   See Encounters Tab for problem based charting.  Patient discussed with Dr. Angelia Mould

## 2017-06-08 NOTE — Assessment & Plan Note (Signed)
Previously quit smoking using chantix, unfortunately is now self pay. She is agreeable to trying wellbutrin. Prescription sent to pharmacy.  -- Wellbutrin 150 mg BID -- F/u 4-5 weeks

## 2017-06-08 NOTE — Assessment & Plan Note (Signed)
Lipid Panel     Component Value Date/Time   CHOL 123 05/25/2017 0800   TRIG 164 (H) 05/25/2017 0800   HDL 31 (L) 05/25/2017 0800   CHOLHDL 4.0 05/25/2017 0800   CHOLHDL 5.4 02/11/2014 0852   VLDL 28 02/11/2014 0852   LDLCALC 59 05/25/2017 0800   LDLDIRECT 142.4 03/21/2011 0843    10 year ASCVD risk of 20.6% based on most recent lipid panel. Cardiology recommended vascepa for mildly elevated triglycerides. Unfortunately patient is self pay and this is not an affordable option. Will continue with high intensity statin.  -- Continue Lipitor 80 mg

## 2017-06-08 NOTE — Patient Instructions (Addendum)
Ms. Huckeba,  It was a pleasure to see you today. We have made a few changes to your medications.  For your blood pressure, I have doubled the dose of your losartan. Please take 50 mg daily. You may take 2 tablets of your current prescription until you run out and then start the new prescription.  I have also doubled the dose of your glipizde, one of your diabetes medications. Please take 10 mg daily. You may take 2 tablets of your current prescription until you run out then start the new prescription.   I will call you with the results of your thyroid test tomorrow and let you know if your thyroid medication dose needs to be adjusted. In the meantime, continue taking your current dose.   For your smoking, I have given you a prescription called wellbutrin (aka buproprion) to help you stop. Please take this twice a day. Follow up with me again in 4-6 weeks or sooner if needed.  If you have any questions or concerns, call our clinic at [phone] or after hours call [phone] and ask for the internal medicine resident on call. Thank you!  - Dr. Philipp Ovens

## 2017-06-08 NOTE — Assessment & Plan Note (Addendum)
Checking TSH today. Continue synthroid 150 ug for now pending results.   ADDENDUM: TSH within normal limits. Refill of synthroid 150 ug sent to pharmacy. Called patient with results.

## 2017-06-08 NOTE — Assessment & Plan Note (Signed)
05/25/2017 stress test low risk.

## 2017-06-08 NOTE — Telephone Encounter (Signed)
Left patient message in regards to stress results.. Informed her that we had sent a letter out with all results and if she has any questions, she may contact us.Marland Kitchen

## 2017-06-08 NOTE — Assessment & Plan Note (Signed)
Uncontrolled, A1c today 8.2. Again I stressed the importance of diet and exercise. She continues to eat very poorly. Will increase glipizde to 10 mg daily. If still uncontrolled at next A1c check consider adding pioglitazone. She has no history of heart failure. Options are limited as patient is self pay. -- Increase glipizde to 10 mg daily -- Continue metformin 1,000 mg BID  -- F/u 3 months for A1c recheck

## 2017-06-08 NOTE — Telephone Encounter (Signed)
New Message  Pt returning call to nurse about stress test results

## 2017-06-09 LAB — TSH: TSH: 4.27 u[IU]/mL (ref 0.450–4.500)

## 2017-06-09 MED ORDER — LEVOTHYROXINE SODIUM 150 MCG PO TABS: ORAL_TABLET | ORAL | 5 refills | Status: DC

## 2017-06-09 NOTE — Addendum Note (Signed)
Addended by: Jodean Lima on: 06/09/2017 08:36 AM   Modules accepted: Orders

## 2017-06-11 NOTE — Progress Notes (Signed)
Internal Medicine Clinic Attending  Case discussed with Dr. Guilloud at the time of the visit.  We reviewed the resident's history and exam and pertinent patient test results.  I agree with the assessment, diagnosis, and plan of care documented in the resident's note.  

## 2017-06-23 ENCOUNTER — Ambulatory Visit (INDEPENDENT_AMBULATORY_CARE_PROVIDER_SITE_OTHER): Payer: Self-pay

## 2017-06-23 ENCOUNTER — Encounter (HOSPITAL_COMMUNITY): Payer: Self-pay | Admitting: Emergency Medicine

## 2017-06-23 ENCOUNTER — Ambulatory Visit (HOSPITAL_COMMUNITY)
Admission: EM | Admit: 2017-06-23 | Discharge: 2017-06-23 | Disposition: A | Discharge status: Home / Self Care | Payer: Self-pay | Attending: Family Medicine | Admitting: Family Medicine

## 2017-06-23 DIAGNOSIS — M79674 Pain in right toe(s): Secondary | ICD-10-CM

## 2017-06-23 NOTE — Discharge Instructions (Addendum)
Xray negative for fracture or dislocation. Continue aleve 422m twice day for the pain. Ice compress as needed. Follow up with PCP if symptoms continues.

## 2017-06-23 NOTE — ED Provider Notes (Signed)
[facility]    CSN: [id number] Arrival date & time: 06/23/17  1541     History   Chief Complaint Chief Complaint  Patient presents with  . Foot Pain    HPI Kendra Adams is a 57 y.o. female.   57 year old female comes in for 3-day history of right toe pain after tripping. States tripped over her dog, which caused hyperextension to the right 2nd toe. Has had swelling to the toe with pain. Has continue to bear weight, though tries to lift the toe up. States work requires long hours of walking/standing, and has noticed an increase in pain. Has take aleve with some relief. Has had some numbness to that toe.      Past Medical History:  Diagnosis Date  . Allergic rhinitis   . Asthma   . CAD (coronary artery disease)    a. 12/13/10: mRCA 99%, EF 65-70%;  s/p BMS to mRCA  . COPD (chronic obstructive pulmonary disease) ([place])   . Depression   . DM2 (diabetes mellitus, type 2) ([place])   . GERD (gastroesophageal reflux disease)   . HTN (hypertension)   . Hyperlipidemia   . Hyperlipidemia   . Hypothyroidism   . Seasonal allergies   . Tobacco abuse     Patient Active Problem List   Diagnosis Date Noted  . Bilateral hip pain 05/27/2017  . Dyspareunia, female 04/06/2017  . Bronchitis 02/24/2017  . Atopic dermatitis 01/01/2017  . History of restless legs syndrome 11/24/2016  . 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 Right wrist pain 10/26/2015  . Psoriasis 10/15/2015  . Chest pain 08/13/2015  . Status post laparoscopic cholecystectomy 08/12/2015  . Elevated transaminase level 08/12/2015  . Positive fecal occult blood test 07/10/2015  . Chronic cough 05/16/2015  . Tendinitis of left rotator cuff 01/19/2015  . Tobacco use disorder 09/07/2014  . Morbid obesity ([place]) 03/19/2011  . Routine health maintenance 02/03/2011  . Left groin pain 02/03/2011  . CAD (coronary artery disease) 12/26/2010  . Diabetes mellitus type 2 with complications ([place])  27/05/5007  . Hypertension associated with diabetes ([place]) 05/10/2008  . Hypothyroidism 04/27/2008  . HLD (hyperlipidemia) 04/27/2008  . Depression 04/27/2008  . GERD 04/27/2008    Past Surgical History:  Procedure Laterality Date  . CARDIAC CATHETERIZATION N/A 08/14/2015   Procedure: Left Heart Cath and Coronary Angiography;  Surgeon: Peter M Martinique, MD;  Location: [facility];  Service: Cardiovascular;  Laterality: N/A;  . CARPAL TUNNEL RELEASE     w/ bone spurs  . CHOLECYSTECTOMY N/A 08/15/2015   Procedure: LAPAROSCOPIC CHOLECYSTECTOMY WITH ATTEMPTED INTRAOPERATIVE CHOLANGIOGRAM;  Surgeon: Erroll Luna, MD;  Location: [facility];  Service: General;  Laterality: N/A;  . CORONARY ANGIOPLASTY WITH STENT PLACEMENT    . DILATION AND CURETTAGE OF UTERUS      OB History    Gravida  2   Para      Term      Preterm      AB  2   Living        SAB  1   TAB  1   Ectopic      Multiple      Live Births               Home Medications    Prior to Admission medications   Medication Sig Start Date End Date Taking? Authorizing Provider  albuterol (PROVENTIL HFA;VENTOLIN HFA) 108 (90 Base) MCG/ACT inhaler Inhale 1-2 puffs into  the lungs every 6 (six) hours as needed for wheezing or shortness of breath. 02/17/17   Duanne Guess, PA-C  aspirin 81 MG chewable tablet Chew 81 mg by mouth every morning.     [provider]  atorvastatin (LIPITOR) 80 MG tablet Take 1 tablet (80 mg total) by mouth daily. 11/24/16   [name], MD  betamethasone dipropionate (DIPROLENE) 0.05 % cream Apply topically 2 (two) times daily. 06/08/17   [name], MD  Blood Glucose Monitoring Suppl DEVI 1 Device by Does not apply route daily before breakfast. 05/23/14   Emokpae, Ejiroghene E, MD  buPROPion (WELLBUTRIN SR) 150 MG 12 hr tablet Take 1 tablet (150 mg total) by mouth 2 (two) times daily. 06/08/17 06/08/18  [name], MD  dextromethorphan (DELSYM) 30 MG/5ML liquid Take  5 mLs (30 mg total) by mouth as needed for cough. 02/24/17   Jule Ser, DO  fluticasone (FLONASE) 50 MCG/ACT nasal spray Place 2 sprays into both nostrils daily. 02/24/17   Jule Ser, DO  glipiZIDE (GLUCOTROL) 10 MG tablet Take 1 tablet (10 mg total) by mouth daily. 06/08/17 06/08/18  [name], MD  Glucose Blood (BLOOD GLUCOSE TEST STRIPS) STRP 1 strip by In Vitro route daily before breakfast. 05/23/14   Emokpae, Ejiroghene E, MD  Insulin Pen Needle (BD PEN NEEDLE NANO U/F) 32G X 4 MM MISC 1 Units/day by Does not apply route daily. 02/26/15   Loleta Chance, MD  Lancets MISC 1 Units by Does not apply route daily. 05/23/14   Emokpae, Ejiroghene E, MD  levothyroxine (SYNTHROID, LEVOTHROID) 150 MCG tablet TAKE ONE TABLET BY MOUTH ONCE DAILY BEFORE BREAST 06/09/17   [name], MD  losartan (COZAAR) 50 MG tablet Take 1 tablet (50 mg total) by mouth daily. 06/08/17 06/08/18  [name], MD  metFORMIN (GLUCOPHAGE) 1000 MG tablet Take 1 tablet (1,000 mg total) by mouth 2 (two) times daily. 06/08/17   [name], MD  metoprolol succinate (TOPROL-XL) 100 MG 24 hr tablet TAKE ONE TABLET BY MOUTH ONCE DAILY WITH  OR  IMMEDIATELY  FOLLOWING  A  MEAL 06/08/17   [name], MD  nitroGLYCERIN (NITROSTAT) 0.4 MG SL tablet Place 1 tablet (0.4 mg total) under the tongue every 5 (five) minutes as needed for chest pain. 05/13/17   [name], PA-C  Omega-3 Fatty Acids (FISH OIL) 1000 MG CAPS Take 1 capsule by mouth every morning.     [provider]  omeprazole (PRILOSEC) 40 MG capsule Take 1 capsule (40 mg total) by mouth daily. 11/24/16   [name], MD  rOPINIRole (REQUIP) 1 MG tablet Take 1 tablet (1 mg total) by mouth at bedtime. 06/02/17   [name], MD  sertraline (ZOLOFT) 100 MG tablet Take 1.5 tablets (150 mg total) by mouth daily. 06/02/17   [name], MD  Spacer/Aero-Holding Chambers (AEROCHAMBER PLUS) inhaler Use as instructed 02/03/15    Melynda Ripple, MD  triamcinolone cream (KENALOG) 0.1 % Apply 1 application topically 2 (two) times daily. Patient taking differently: Apply 1 application topically 3 (three) times daily as needed (itching).  02/25/16   [name], MD    Family History Family History  Problem Relation Age of Onset  . Diabetes Mother   . Hypertension Mother     Social History Social History   Tobacco Use  . Smoking status: Current Every Day Smoker    Packs/day: 0.40    Years: 40.00    Pack years: 16.00    Types: Cigarettes  Last attempt to quit: 05/19/2014    Years since quitting: 3.0  . Smokeless tobacco: Never Used  . Tobacco comment: Trying to quit again-cutting back  Substance Use Topics  . Alcohol use: No    Alcohol/week: 0.0 oz  . Drug use: Yes    Frequency: 7.0 times per week    Types: Marijuana    Comment: Marijuana Use     Allergies   Gabapentin; Nsaids; Opium; and Tramadol hcl   Review of Systems Review of Systems  Reason unable to perform ROS: See HPI as above.     Physical Exam Triage Vital Signs ED Triage Vitals [06/23/17 1549]  Enc Vitals Group     BP 131/67     Pulse Rate 79     Resp 18     Temp 98.2 F (36.8 C)     Temp src      SpO2 97 %     Weight      Height      Head Circumference      Peak Flow      Pain Score      Pain Loc      Pain Edu?      Excl. in [place]?    No data found.  Updated Vital Signs BP 131/67   Pulse 79   Temp 98.2 F (36.8 C)   Resp 18   SpO2 97%   Physical Exam  Constitutional: She is oriented to person, place, and time. She appears well-developed and well-nourished. No distress.  HENT:  Head: Normocephalic and atraumatic.  Eyes: Pupils are equal, round, and reactive to light. Conjunctivae are normal.  Musculoskeletal:  Mild swelling of the 2nd right toe without erythema, increased warmth, contusion. Tenderness to palpation of distal 2nd MTP and toe. Most tender around PIP joint. Full ROM. Sensation intact.  Pedal pulse 2+, cap refill <2s  Neurological: She is alert and oriented to person, place, and time.     UC Treatments / Results  Labs (all labs ordered are listed, but only abnormal results are displayed) Labs Reviewed - No data to display  EKG None Radiology Dg Foot Complete Right  Result Date: 06/23/2017 CLINICAL DATA:  Tripping injury right foot pain EXAM: RIGHT FOOT COMPLETE - 3+ VIEW COMPARISON:  None available FINDINGS: Diffuse degenerative changes with joint space loss and bony spurring of the ankle joint, midfoot, and tarsal metatarsal joints. Minor degenerative osteoarthritis of the MTP joints. No acute osseous finding or fracture. Plantar calcaneal spur noted. No definite soft tissue abnormality. No visualized radiopaque foreign body. 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 Calcaneal spurring No acute osseous finding by plain radiography Electronically Signed   By: Jerilynn Mages.  Shick M.D.   On: 06/23/2017 16:04    Procedures Procedures (including critical care time)  Medications Ordered in UC Medications - No data to display   Initial Impression / Assessment and Plan / UC Course  I have reviewed the triage vital signs and the nursing notes.  Pertinent labs & imaging results that were available during my care of the patient were reviewed by me and considered in my medical decision making (see chart for details).    Xray negative for fracture or dislocation. There was some irregularity to the xray at the distal right 2nd PIP, confirmed with radiologist Dr Levy Sjogren no fractures.  Patient with NSAIDs listed as allergy, but has been able to tolerate Aleve, will have patient continue Aleve, ice compress, rest.  Follow-up with  PCP for further evaluation if symptoms not improving.  Final Clinical Impressions(s) / UC Diagnoses   Final diagnoses:  Toe pain, right    ED Discharge Orders    None        Arturo Morton 06/23/17 1703

## 2017-06-23 NOTE — ED Triage Notes (Signed)
Pt tripped over her dog 3 days ago c/o R foot pain.

## 2017-08-07 ENCOUNTER — Other Ambulatory Visit: Payer: Self-pay | Admitting: Internal Medicine

## 2017-08-07 DIAGNOSIS — I10 Essential (primary) hypertension: Secondary | ICD-10-CM

## 2017-08-17 ENCOUNTER — Encounter: Payer: Self-pay | Admitting: Internal Medicine

## 2017-08-17 ENCOUNTER — Other Ambulatory Visit: Payer: Self-pay

## 2017-08-17 ENCOUNTER — Ambulatory Visit (INDEPENDENT_AMBULATORY_CARE_PROVIDER_SITE_OTHER): Payer: Self-pay | Admitting: Internal Medicine

## 2017-08-17 VITALS — BP 153/97 | HR 72 | Temp 98.5°F | Ht 63.0 in | Wt 258.0 lb

## 2017-08-17 DIAGNOSIS — E118 Type 2 diabetes mellitus with unspecified complications: Secondary | ICD-10-CM

## 2017-08-17 DIAGNOSIS — Z79899 Other long term (current) drug therapy: Secondary | ICD-10-CM

## 2017-08-17 DIAGNOSIS — F329 Major depressive disorder, single episode, unspecified: Secondary | ICD-10-CM

## 2017-08-17 DIAGNOSIS — F17200 Nicotine dependence, unspecified, uncomplicated: Secondary | ICD-10-CM

## 2017-08-17 DIAGNOSIS — I2510 Atherosclerotic heart disease of native coronary artery without angina pectoris: Secondary | ICD-10-CM

## 2017-08-17 DIAGNOSIS — I10 Essential (primary) hypertension: Secondary | ICD-10-CM

## 2017-08-17 DIAGNOSIS — Z8669 Personal history of other diseases of the nervous system and sense organs: Secondary | ICD-10-CM

## 2017-08-17 DIAGNOSIS — F32A Depression, unspecified: Secondary | ICD-10-CM

## 2017-08-17 DIAGNOSIS — Z7984 Long term (current) use of oral hypoglycemic drugs: Secondary | ICD-10-CM

## 2017-08-17 DIAGNOSIS — F17210 Nicotine dependence, cigarettes, uncomplicated: Secondary | ICD-10-CM

## 2017-08-17 DIAGNOSIS — I152 Hypertension secondary to endocrine disorders: Secondary | ICD-10-CM

## 2017-08-17 DIAGNOSIS — E1159 Type 2 diabetes mellitus with other circulatory complications: Secondary | ICD-10-CM

## 2017-08-17 DIAGNOSIS — M79674 Pain in right toe(s): Secondary | ICD-10-CM | POA: Insufficient documentation

## 2017-08-17 LAB — POCT GLYCOSYLATED HEMOGLOBIN (HGB A1C): Hemoglobin A1C: 7.2 % — AB (ref 4.0–5.6)

## 2017-08-17 LAB — GLUCOSE, CAPILLARY: Glucose-Capillary: 157 mg/dL — ABNORMAL HIGH (ref 65–99)

## 2017-08-17 MED ORDER — SERTRALINE HCL 100 MG PO TABS: 150.0000 mg | ORAL_TABLET | Freq: Every day | ORAL | 11 refills | Status: DC

## 2017-08-17 MED ORDER — GLIPIZIDE 10 MG PO TABS: 10.0000 mg | ORAL_TABLET | Freq: Every day | ORAL | 1 refills | Status: DC

## 2017-08-17 MED ORDER — METOPROLOL SUCCINATE ER 100 MG PO TB24: ORAL_TABLET | ORAL | 3 refills | Status: DC

## 2017-08-17 MED ORDER — LOSARTAN POTASSIUM 100 MG PO TABS: 100.0000 mg | ORAL_TABLET | Freq: Every day | ORAL | 2 refills | Status: DC

## 2017-08-17 MED ORDER — ROPINIROLE HCL 1 MG PO TABS: 1.0000 mg | ORAL_TABLET | Freq: Every day | ORAL | 3 refills | Status: DC

## 2017-08-17 NOTE — Assessment & Plan Note (Signed)
Patient has done well with Welbutrin, started at her last visit for smoking cessation. She was previously smoking 1 PPD, and is down to 1-2 cigarettes a day. Patient is pleased with her progress and would like to continue with medical therapy. I provided support and congratulated her on her progress. We have set a goal to have her stop completely by her next follow up visit in 3 months.  -- Continue wellbutrin 150 mg BID

## 2017-08-17 NOTE — Assessment & Plan Note (Signed)
Patient experienced a fall 2 months ago where she tripped over her dog. She hyperextended her second right toe. She went to the urgent care at the time and xrays were negative for fracture. She continues to have pain especially with weight bearing and after being on her feet all day. On exam ROM is intact and there is only mild tenderness to palpation. Overall no significant erythema or swelling. Reassured patient that injury is likely a ligament sprain and should improve with time. Advised rest, ice, elevation. May attempt buddy tape to adjacent toe for support if able, although may be difficult given hammer toe deformity.  -- Follow up as needed

## 2017-08-17 NOTE — Assessment & Plan Note (Signed)
A1c has improved today 8.2 -> 7.2 with an increase in glipizide and dietary changes. She has cut back on her sugar intake. Encouraged patient to keep up the positive dietary change. Will continue current regimen. -- Metformin 1,000 mg BID -- Glipizide 10 mg daily

## 2017-08-17 NOTE — Progress Notes (Signed)
   CC: DM and HTN follow up  HPI:  [name] is a 57 y.o. female with past medical history outlined below here for HTN and DM follow up. For the details of today's visit, please refer to the assessment and plan.  Past Medical History:  Diagnosis Date  . Allergic rhinitis   . Asthma   . CAD (coronary artery disease)    a. 12/13/10: mRCA 99%, EF 65-70%;  s/p BMS to mRCA  . COPD (chronic obstructive pulmonary disease) ([place])   . Depression   . DM2 (diabetes mellitus, type 2) ([place])   . GERD (gastroesophageal reflux disease)   . HTN (hypertension)   . Hyperlipidemia   . Hyperlipidemia   . Hypothyroidism   . Seasonal allergies   . Tobacco abuse     Review of Systems  Neurological: Positive for dizziness. Negative for loss of consciousness.    Physical Exam:  Vitals:   08/17/17 1544 08/17/17 1549 08/17/17 1550 08/17/17 1551  BP: (!) 153/78 (!) 142/82 (!) 147/67 (!) 153/97  Pulse: 70 69 71 72  Temp: 98.5 F (36.9 C)     TempSrc: Oral     SpO2: 97%     Weight: 258 lb (117 kg)     Height: 5' 3"  (1.6 m)       Constitutional: NAD, appears comfortable Cardiovascular: RRR, no murmurs, rubs, or gallops.  Pulmonary/Chest: CTAB, no wheezes, rales, or rhonchi.  Extremities: Warm and well perfused. No edema. Bilateral feet with hammer toe deformities. Right second toe with mild swelling, ROM intact, mildly tender to palpation.  Psychiatric: Normal mood and affect  Assessment & Plan:   See Encounters Tab for problem based charting.  Patient discussed with Dr. Daryll Drown

## 2017-08-17 NOTE — Assessment & Plan Note (Signed)
Well controlled on Zoloft 150 mg daily. Refilled.

## 2017-08-17 NOTE — Patient Instructions (Addendum)
FOLLOW-UP INSTRUCTIONS When: 3 months For: DM and BP follow up What to bring: Medications   [name],  Your blood pressure today is high. I am increasing your losartan again to 100 mg daily. You may take two tablets of your current prescription until you run out and then start the new prescription. Continue to take your metoprolol daily.   Your A1c today has improved form 8.2 -> 7.2! That is great news. Please continue to limit your sugar intake and take your medications daily. I have sent refills to your pharmacy.   Keep up the good work with cutting back on smoking. I would like to see you quit before your next appointment, I know you can do it! I have refilled your wellbutrin.   Follow up with me again in 3 months. If you have any questions or concerns, call our clinic at [phone] or after hours call [phone] and ask for the internal medicine resident on call. Thank you!  - Dr. Philipp Ovens   How to Perform the Epley Maneuver The Epley maneuver is an exercise that relieves symptoms of vertigo. Vertigo is the feeling that you or your surroundings are moving when they are not. When you feel vertigo, you may feel like the room is spinning and have trouble walking. Dizziness is a little different than vertigo. When you are dizzy, you may feel unsteady or light-headed. You can do this maneuver at home whenever you have symptoms of vertigo. You can do it up to 3 times a day until your symptoms go away. Even though the Epley maneuver may relieve your vertigo for a few weeks, it is possible that your symptoms will return. This maneuver relieves vertigo, but it does not relieve dizziness. What are the risks? If it is done correctly, the Epley maneuver is considered safe. Sometimes it can lead to dizziness or nausea that goes away after a short time. If you develop other symptoms, such as changes in vision, weakness, or numbness, stop doing the maneuver and call your health care provider. How to  perform the Epley maneuver 1. Sit on the edge of a bed or table with your back straight and your legs extended or hanging over the edge of the bed or table. 2. Turn your head halfway toward the affected ear or side. 3. Lie backward quickly with your head turned until you are lying flat on your back. You may want to position a pillow under your shoulders. 4. Hold this position for 30 seconds. You may experience an attack of vertigo. This is normal. 5. Turn your head to the opposite direction until your unaffected ear is facing the floor. 6. Hold this position for 30 seconds. You may experience an attack of vertigo. This is normal. Hold this position until the vertigo stops. 7. Turn your whole body to the same side as your head. Hold for another 30 seconds. 8. Sit back up. You can repeat this exercise up to 3 times a day. Follow these instructions at home:  After doing the Epley maneuver, you can return to your normal activities.  Ask your health care provider if there is anything you should do at home to prevent vertigo. He or she may recommend that you: ? Keep your head raised (elevated) with two or more pillows while you sleep. ? Do not sleep on the side of your affected ear. ? Get up slowly from bed. ? Avoid sudden movements during the day. ? Avoid extreme head movement, like looking up  or bending over. Contact a health care provider if:  Your vertigo gets worse.  You have other symptoms, including: ? Nausea. ? Vomiting. ? Headache. Get help right away if:  You have vision changes.  You have a severe or worsening headache or neck pain.  You cannot stop vomiting.  You have new numbness or weakness in any part of your body. Summary  Vertigo is the feeling that you or your surroundings are moving when they are not.  The Epley maneuver is an exercise that relieves symptoms of vertigo.  If the Epley maneuver is done correctly, it is considered safe. You can do it up to 3 times  a day. This information is not intended to replace advice given to you by your health care provider. Make sure you discuss any questions you have with your health care provider. Document Released: 03/01/2013 Document Revised: 01/15/2016 Document Reviewed: 01/15/2016 Elsevier Interactive Patient Education  2017 [employer].

## 2017-08-17 NOTE — Assessment & Plan Note (Signed)
Refilled ropinirole.

## 2017-08-17 NOTE — Assessment & Plan Note (Signed)
Uncontrolled today, 153/78 and persistently elevated on recheck. Previously well controlled on losartan 25 mg daily and metoprolol XL 100 mg daily. BP was elevated at her prior visit and losartan dose was increased to 50 mg daily. Unfortunately she is still uncontrolled. This is possibly a side effect from Wellbutrin, which was recently started for smoking cessation. She has lost 10 lbs since her last visit and has successfully cut back on smoking. I am hesitant to stop Wellbutrin since this seems to be helping her. Instead we will continue to uptitrate her regimen in the mean time. May need down titration once off wellbutrin.  -- Increase losartan 50 -> 100 mg daily -- Continue Metoprolol 100 mg daily -- If still uncontrolled at follow up, would add third agent with CCB like amlodipine or HCTZ

## 2017-08-20 NOTE — Progress Notes (Signed)
Internal Medicine Clinic Attending  Case discussed with Dr. Guilloud at the time of the visit.  We reviewed the resident's history and exam and pertinent patient test results.  I agree with the assessment, diagnosis, and plan of care documented in the resident's note.  

## 2017-08-25 ENCOUNTER — Encounter (HOSPITAL_COMMUNITY): Payer: Self-pay | Admitting: Emergency Medicine

## 2017-08-25 ENCOUNTER — Other Ambulatory Visit: Payer: Self-pay

## 2017-08-25 ENCOUNTER — Ambulatory Visit (HOSPITAL_COMMUNITY)
Admission: EM | Admit: 2017-08-25 | Discharge: 2017-08-25 | Disposition: A | Discharge status: Home / Self Care | Payer: Self-pay | Attending: Internal Medicine | Admitting: Internal Medicine

## 2017-08-25 DIAGNOSIS — R21 Rash and other nonspecific skin eruption: Secondary | ICD-10-CM

## 2017-08-25 DIAGNOSIS — W57XXXA Bitten or stung by nonvenomous insect and other nonvenomous arthropods, initial encounter: Secondary | ICD-10-CM

## 2017-08-25 MED ORDER — HYDROCORTISONE 2.5 % EX CREA: TOPICAL_CREAM | Freq: Two times a day (BID) | CUTANEOUS | 0 refills | Status: DC

## 2017-08-25 MED ORDER — DOXYCYCLINE HYCLATE 100 MG PO CAPS: 100.0000 mg | ORAL_CAPSULE | Freq: Two times a day (BID) | ORAL | 0 refills | Status: AC

## 2017-08-25 NOTE — ED Provider Notes (Signed)
[place]    CSN: [id number] Arrival date & time: 08/25/17  1331     History   Chief Complaint Chief Complaint  Patient presents with  . Tick Removal    HPI Kendra Adams is a 57 y.o. female history of tobacco use, hyperlipidemia, hypertension, DM type II, COPD presenting today for evaluation of a tick bite and rash.  She states that she removed a tick from her right upper thigh Sunday.  Since she has had itching, denies pain.  Noticed a white dot on the bite today and was concerned about this.  Also noting a rash to her anterior shins and knees.  Mild associated itching, popped up today.  Has myalgias at baseline due to psoriatic arthritis.  Denies fevers, does endorse night sweats.  HPI  Past Medical History:  Diagnosis Date  . Allergic rhinitis   . Asthma   . CAD (coronary artery disease)    a. 12/13/10: mRCA 99%, EF 65-70%;  s/p BMS to mRCA  . COPD (chronic obstructive pulmonary disease) ([place])   . Depression   . DM2 (diabetes mellitus, type 2) ([place])   . GERD (gastroesophageal reflux disease)   . HTN (hypertension)   . Hyperlipidemia   . Hyperlipidemia   . Hypothyroidism   . Seasonal allergies   . Tobacco abuse     Patient Active Problem List   Diagnosis Date Noted  . Toe pain, right 08/17/2017  . Bilateral hip pain 05/27/2017  . Dyspareunia, female 04/06/2017  . Bronchitis 02/24/2017  . Atopic dermatitis 01/01/2017  . History of restless legs syndrome 11/24/2016  . 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 Right wrist pain 10/26/2015  . Psoriasis 10/15/2015  . Chest pain 08/13/2015  . Status post laparoscopic cholecystectomy 08/12/2015  . Elevated transaminase level 08/12/2015  . Positive fecal occult blood test 07/10/2015  . Chronic cough 05/16/2015  . Tendinitis of left rotator cuff 01/19/2015  . Tobacco use disorder 09/07/2014  . Morbid obesity ([place]) 03/19/2011  . Routine health maintenance 02/03/2011  . Left groin  pain 02/03/2011  . CAD (coronary artery disease) 12/26/2010  . Diabetes mellitus type 2 with complications ([place]) 70/03/7492  . Hypertension associated with diabetes ([place]) 05/10/2008  . Hypothyroidism 04/27/2008  . HLD (hyperlipidemia) 04/27/2008  . Depression 04/27/2008  . GERD 04/27/2008    Past Surgical History:  Procedure Laterality Date  . CARDIAC CATHETERIZATION N/A 08/14/2015   Procedure: Left Heart Cath and Coronary Angiography;  Surgeon: Peter M Martinique, MD;  Location: [facility];  Service: Cardiovascular;  Laterality: N/A;  . CARPAL TUNNEL RELEASE     w/ bone spurs  . CHOLECYSTECTOMY N/A 08/15/2015   Procedure: LAPAROSCOPIC CHOLECYSTECTOMY WITH ATTEMPTED INTRAOPERATIVE CHOLANGIOGRAM;  Surgeon: Erroll Luna, MD;  Location: [facility];  Service: General;  Laterality: N/A;  . CORONARY ANGIOPLASTY WITH STENT PLACEMENT    . DILATION AND CURETTAGE OF UTERUS      OB History    Gravida  2   Para      Term      Preterm      AB  2   Living        SAB  1   TAB  1   Ectopic      Multiple      Live Births               Home Medications    Prior to Admission medications   Medication Sig Start Date  End Date Taking? Authorizing Provider  albuterol (PROVENTIL HFA;VENTOLIN HFA) 108 (90 Base) MCG/ACT inhaler Inhale 1-2 puffs into the lungs every 6 (six) hours as needed for wheezing or shortness of breath. 02/17/17   Duanne Guess, PA-C  aspirin 81 MG chewable tablet Chew 81 mg by mouth every morning.     [provider]  atorvastatin (LIPITOR) 80 MG tablet TAKE 1 TABLET BY MOUTH ONCE DAILY 08/10/17   [name], MD  betamethasone dipropionate (DIPROLENE) 0.05 % cream Apply topically 2 (two) times daily. 06/08/17   [name], MD  Blood Glucose Monitoring Suppl DEVI 1 Device by Does not apply route daily before breakfast. 05/23/14   Emokpae, Ejiroghene E, MD  buPROPion (WELLBUTRIN SR) 150 MG 12 hr tablet Take 1 tablet (150 mg total) by mouth 2  (two) times daily. 06/08/17 06/08/18  [name], MD  dextromethorphan (DELSYM) 30 MG/5ML liquid Take 5 mLs (30 mg total) by mouth as needed for cough. 02/24/17   Jule Ser, DO  doxycycline (VIBRAMYCIN) 100 MG capsule Take 1 capsule (100 mg total) by mouth 2 (two) times daily for 10 days. 08/25/17 09/04/17  Wieters, Hallie C, PA-C  fluticasone (FLONASE) 50 MCG/ACT nasal spray Place 2 sprays into both nostrils daily. 02/24/17   Jule Ser, DO  glipiZIDE (GLUCOTROL) 10 MG tablet Take 1 tablet (10 mg total) by mouth daily. 08/17/17 08/17/18  [name], MD  Glucose Blood (BLOOD GLUCOSE TEST STRIPS) STRP 1 strip by In Vitro route daily before breakfast. 05/23/14   Emokpae, Ejiroghene E, MD  hydrocortisone 2.5 % cream Apply topically 2 (two) times daily. 08/25/17   Wieters, Hallie C, PA-C  Insulin Pen Needle (BD PEN NEEDLE NANO U/F) 32G X 4 MM MISC 1 Units/day by Does not apply route daily. 02/26/15   Loleta Chance, MD  Lancets MISC 1 Units by Does not apply route daily. 05/23/14   Emokpae, Ejiroghene E, MD  levothyroxine (SYNTHROID, LEVOTHROID) 150 MCG tablet TAKE ONE TABLET BY MOUTH ONCE DAILY BEFORE BREAST 06/09/17   [name], MD  losartan (COZAAR) 100 MG tablet Take 1 tablet (100 mg total) by mouth daily. 08/17/17 08/17/18  [name], MD  metFORMIN (GLUCOPHAGE) 1000 MG tablet Take 1 tablet (1,000 mg total) by mouth 2 (two) times daily. 06/08/17   [name], MD  metoprolol succinate (TOPROL-XL) 100 MG 24 hr tablet TAKE ONE TABLET BY MOUTH ONCE DAILY WITH  OR  IMMEDIATELY  FOLLOWING  A  MEAL 08/17/17   [name], MD  nitroGLYCERIN (NITROSTAT) 0.4 MG SL tablet Place 1 tablet (0.4 mg total) under the tongue every 5 (five) minutes as needed for chest pain. 05/13/17   [name], PA-C  Omega-3 Fatty Acids (FISH OIL) 1000 MG CAPS Take 1 capsule by mouth every morning.     [provider]  omeprazole (PRILOSEC) 40 MG capsule Take 1 capsule (40 mg total) by  mouth daily. 11/24/16   [name], MD  rOPINIRole (REQUIP) 1 MG tablet Take 1 tablet (1 mg total) by mouth at bedtime. 08/17/17   [name], MD  sertraline (ZOLOFT) 100 MG tablet Take 1.5 tablets (150 mg total) by mouth daily. 08/17/17   [name], MD  Spacer/Aero-Holding Chambers (AEROCHAMBER PLUS) inhaler Use as instructed 02/03/15   Melynda Ripple, MD  triamcinolone cream (KENALOG) 0.1 % Apply 1 application topically 2 (two) times daily. Patient taking differently: Apply 1 application topically 3 (three) times daily as needed (itching).  02/25/16   [name], MD  Family History Family History  Problem Relation Age of Onset  . Diabetes Mother   . Hypertension Mother     Social History Social History   Tobacco Use  . Smoking status: Current Every Day Smoker    Packs/day: 0.40    Years: 40.00    Pack years: 16.00    Types: Cigarettes    Last attempt to quit: 05/19/2014    Years since quitting: 3.2  . Smokeless tobacco: Never Used  . Tobacco comment: Trying to quit again-cutting back  Substance Use Topics  . Alcohol use: No    Alcohol/week: 0.0 oz  . Drug use: Yes    Frequency: 7.0 times per week    Types: Marijuana    Comment: Marijuana Use     Allergies   Gabapentin; Nsaids; Opium; and Tramadol hcl   Review of Systems Review of Systems  Constitutional: Negative for fatigue and fever.  HENT: Negative for mouth sores.   Eyes: Negative for visual disturbance.  Respiratory: Negative for shortness of breath.   Cardiovascular: Negative for chest pain.  Gastrointestinal: Negative for abdominal pain, nausea and vomiting.  Genitourinary: Negative for genital sores.  Musculoskeletal: Positive for arthralgias. Negative for joint swelling.  Skin: Positive for color change and rash. Negative for wound.  Neurological: Negative for dizziness, weakness, light-headedness and headaches.     Physical Exam Triage Vital Signs ED Triage Vitals    Enc Vitals Group     BP 08/25/17 1344 (!) 143/73     Pulse Rate 08/25/17 1344 83     Resp 08/25/17 1344 18     Temp 08/25/17 1344 98.3 F (36.8 C)     Temp Source 08/25/17 1344 Oral     SpO2 08/25/17 1344 96 %     Weight --      Height --      Head Circumference --      Peak Flow --      Pain Score 08/25/17 1343 0     Pain Loc --      Pain Edu? --      Excl. in [place]? --    No data found.  Updated Vital Signs BP (!) 143/73 (BP Location: Left Arm)   Pulse 83   Temp 98.3 F (36.8 C) (Oral)   Resp 18   SpO2 96%   Visual Acuity Right Eye Distance:   Left Eye Distance:   Bilateral Distance:    Right Eye Near:   Left Eye Near:    Bilateral Near:     Physical Exam  Constitutional: She appears well-developed and well-nourished. No distress.  HENT:  Head: Normocephalic and atraumatic.  Mouth/Throat: Oropharynx is clear and moist.  Eyes: Conjunctivae are normal.  Neck: Neck supple.  Cardiovascular: Normal rate and regular rhythm.  No murmur heard. Pulmonary/Chest: Effort normal and breath sounds normal. No respiratory distress.  Prolonged expiratory phase  Abdominal: Soft. There is no tenderness.  Musculoskeletal: She exhibits no edema.  Neurological: She is alert.  Skin: Skin is warm and dry.  2 cm x 1 cm area of erythema to right proximal thigh, central white spot  Erythematous macular papular rash extending from knees downward, does not extend beyond short line, only in exposed skin  Psychiatric: She has a normal mood and affect.  Nursing note and vitals reviewed.    UC Treatments / Results  Labs (all labs ordered are listed, but only abnormal results are displayed) Labs Reviewed - No data to display  EKG  None  Radiology No results found.  Procedures Procedures (including critical care time)  Medications Ordered in UC Medications - No data to display  Initial Impression / Assessment and Plan / UC Course  I have reviewed the triage vital signs and  the nursing notes.  Pertinent labs & imaging results that were available during my care of the patient were reviewed by me and considered in my medical decision making (see chart for details).     Patient with tick bite, will provide hydrocortisone cream to help with itching, does not appear to be cellulitic at this time.  Given onset of rash although not typical bull's-eye/erythema migrans pattern will go to initiate treatment with doxycycline for 10 days.Discussed strict return precautions. Patient verbalized understanding and is agreeable with plan.  Final Clinical Impressions(s) / UC Diagnoses   Final diagnoses:  Tick bite, initial encounter  Rash and nonspecific skin eruption     Discharge Instructions     Begin doxycycline twice daily for 10 days- this will treat infection as well as tick borne illness  Hydrocortisone twice daily on areas of intense itching, do not apply to entire legs  Follow up if symptoms not resolving or worsening   ED Prescriptions    Medication Sig Dispense Auth. Provider   doxycycline (VIBRAMYCIN) 100 MG capsule Take 1 capsule (100 mg total) by mouth 2 (two) times daily for 10 days. 20 capsule Wieters, Hallie C, PA-C   hydrocortisone 2.5 % cream Apply topically 2 (two) times daily. 30 g Wieters, [place] C, PA-C     Controlled Substance Prescriptions [place] Controlled Substance Registry consulted? Not Applicable   Janith Lima, [place] 08/25/17 1506

## 2017-08-25 NOTE — Discharge Instructions (Signed)
Begin doxycycline twice daily for 10 days- this will treat infection as well as tick borne illness  Hydrocortisone twice daily on areas of intense itching, do not apply to entire legs  Follow up if symptoms not resolving or worsening

## 2017-08-25 NOTE — ED Triage Notes (Signed)
Noticed tick on Sunday.  Tick was on right thigh. And removed by family member.  Patient reports a white dot on insect bite.

## 2017-09-17 ENCOUNTER — Other Ambulatory Visit: Payer: Self-pay | Admitting: Internal Medicine

## 2017-09-17 DIAGNOSIS — F17200 Nicotine dependence, unspecified, uncomplicated: Secondary | ICD-10-CM

## 2017-11-28 ENCOUNTER — Other Ambulatory Visit: Payer: Self-pay | Admitting: Internal Medicine

## 2017-11-28 DIAGNOSIS — K219 Gastro-esophageal reflux disease without esophagitis: Secondary | ICD-10-CM

## 2017-12-25 ENCOUNTER — Encounter (HOSPITAL_COMMUNITY): Payer: Self-pay | Admitting: *Deleted

## 2017-12-25 ENCOUNTER — Emergency Department (HOSPITAL_COMMUNITY): Payer: Self-pay

## 2017-12-25 ENCOUNTER — Emergency Department (HOSPITAL_COMMUNITY)
Admission: EM | Admit: 2017-12-25 | Discharge: 2017-12-26 | Disposition: A | Discharge status: Home / Self Care | Payer: Self-pay | Attending: Emergency Medicine | Admitting: Emergency Medicine

## 2017-12-25 ENCOUNTER — Other Ambulatory Visit: Payer: Self-pay | Admitting: Internal Medicine

## 2017-12-25 ENCOUNTER — Other Ambulatory Visit: Payer: Self-pay

## 2017-12-25 ENCOUNTER — Other Ambulatory Visit: Payer: Self-pay | Admitting: *Deleted

## 2017-12-25 DIAGNOSIS — Z794 Long term (current) use of insulin: Secondary | ICD-10-CM | POA: Insufficient documentation

## 2017-12-25 DIAGNOSIS — Z79899 Other long term (current) drug therapy: Secondary | ICD-10-CM | POA: Insufficient documentation

## 2017-12-25 DIAGNOSIS — F17200 Nicotine dependence, unspecified, uncomplicated: Secondary | ICD-10-CM

## 2017-12-25 DIAGNOSIS — I2510 Atherosclerotic heart disease of native coronary artery without angina pectoris: Secondary | ICD-10-CM | POA: Insufficient documentation

## 2017-12-25 DIAGNOSIS — I10 Essential (primary) hypertension: Secondary | ICD-10-CM

## 2017-12-25 DIAGNOSIS — J069 Acute upper respiratory infection, unspecified: Secondary | ICD-10-CM | POA: Insufficient documentation

## 2017-12-25 DIAGNOSIS — F17210 Nicotine dependence, cigarettes, uncomplicated: Secondary | ICD-10-CM | POA: Insufficient documentation

## 2017-12-25 DIAGNOSIS — J441 Chronic obstructive pulmonary disease with (acute) exacerbation: Secondary | ICD-10-CM | POA: Insufficient documentation

## 2017-12-25 DIAGNOSIS — E119 Type 2 diabetes mellitus without complications: Secondary | ICD-10-CM | POA: Insufficient documentation

## 2017-12-25 DIAGNOSIS — E118 Type 2 diabetes mellitus with unspecified complications: Secondary | ICD-10-CM

## 2017-12-25 DIAGNOSIS — B9789 Other viral agents as the cause of diseases classified elsewhere: Secondary | ICD-10-CM | POA: Insufficient documentation

## 2017-12-25 DIAGNOSIS — E039 Hypothyroidism, unspecified: Secondary | ICD-10-CM | POA: Insufficient documentation

## 2017-12-25 DIAGNOSIS — Z7982 Long term (current) use of aspirin: Secondary | ICD-10-CM | POA: Insufficient documentation

## 2017-12-25 MED ORDER — ALBUTEROL SULFATE HFA 108 (90 BASE) MCG/ACT IN AERS: 1.0000 | INHALATION_SPRAY | Freq: Four times a day (QID) | RESPIRATORY_TRACT | 0 refills | Status: DC | PRN

## 2017-12-25 MED ORDER — IPRATROPIUM-ALBUTEROL 0.5-2.5 (3) MG/3ML IN SOLN
5.0000 mL | Freq: Once | RESPIRATORY_TRACT | Status: AC
  Administered 2017-12-25: 5 mL via RESPIRATORY_TRACT
  Filled 2017-12-25: qty 6
  Filled 2017-12-25: qty 3

## 2017-12-25 NOTE — ED Triage Notes (Signed)
Pt c/o productive (clear mucous) cough for 1 week and post nasal drip

## 2017-12-25 NOTE — Telephone Encounter (Signed)
Next appt scheduled 12/9 with PCP.

## 2017-12-25 NOTE — ED Provider Notes (Signed)
[facility] DEPT Provider Note   CSN: [id number] Arrival date & time: 12/25/17  2238     History   Chief Complaint Chief Complaint  Patient presents with  . Cough    HPI Kendra Adams is a 57 y.o. female.  HPI  57 year old female with history of COPD, diabetes, hypertension, CAD comes in with chief complaint of cough and URI-like symptoms.  Patient reports that she has been having a cough for the last 2 or 3 days.  Her cough is producing clear phlegm.  Patient has been having wheezing along with URI-like symptoms and hoarseness in her voice.  Patient is also having a sore throat.  She denies any fevers, chills, chest pain.  Patient is an active smoker, but reports that she has cut down to 2 or 3 cigarettes a day.   Past Medical History:  Diagnosis Date  . Allergic rhinitis   . Asthma   . CAD (coronary artery disease)    a. 12/13/10: mRCA 99%, EF 65-70%;  s/p BMS to mRCA  . COPD (chronic obstructive pulmonary disease) ([place])   . Depression   . DM2 (diabetes mellitus, type 2) ([place])   . GERD (gastroesophageal reflux disease)   . HTN (hypertension)   . Hyperlipidemia   . Hyperlipidemia   . Hypothyroidism   . Seasonal allergies   . Tobacco abuse     Patient Active Problem List   Diagnosis Date Noted  . Toe pain, right 08/17/2017  . Bilateral hip pain 05/27/2017  . Dyspareunia, female 04/06/2017  . Bronchitis 02/24/2017  . Atopic dermatitis 01/01/2017  . History of restless legs syndrome 11/24/2016  . 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 Right wrist pain 10/26/2015  . Psoriasis 10/15/2015  . Chest pain 08/13/2015  . Status post laparoscopic cholecystectomy 08/12/2015  . Elevated transaminase level 08/12/2015  . Positive fecal occult blood test 07/10/2015  . Chronic cough 05/16/2015  . Tendinitis of left rotator cuff 01/19/2015  . Tobacco use disorder 09/07/2014  . Morbid obesity ([place]) 03/19/2011  . Routine  health maintenance 02/03/2011  . Left groin pain 02/03/2011  . CAD (coronary artery disease) 12/26/2010  . Diabetes mellitus type 2 with complications ([place]) 09/32/3557  . Hypertension associated with diabetes ([place]) 05/10/2008  . Hypothyroidism 04/27/2008  . HLD (hyperlipidemia) 04/27/2008  . Depression 04/27/2008  . GERD 04/27/2008    Past Surgical History:  Procedure Laterality Date  . CARDIAC CATHETERIZATION N/A 08/14/2015   Procedure: Left Heart Cath and Coronary Angiography;  Surgeon: Peter M Martinique, MD;  Location: [facility];  Service: Cardiovascular;  Laterality: N/A;  . CARPAL TUNNEL RELEASE     w/ bone spurs  . CHOLECYSTECTOMY N/A 08/15/2015   Procedure: LAPAROSCOPIC CHOLECYSTECTOMY WITH ATTEMPTED INTRAOPERATIVE CHOLANGIOGRAM;  Surgeon: Erroll Luna, MD;  Location: [facility];  Service: General;  Laterality: N/A;  . CORONARY ANGIOPLASTY WITH STENT PLACEMENT    . DILATION AND CURETTAGE OF UTERUS       OB History    Gravida  2   Para      Term      Preterm      AB  2   Living        SAB  1   TAB  1   Ectopic      Multiple      Live Births               Home Medications    Prior  to Admission medications   Medication Sig Start Date End Date Taking? Authorizing Provider  albuterol (PROVENTIL HFA;VENTOLIN HFA) 108 (90 Base) MCG/ACT inhaler Inhale 1-2 puffs into the lungs every 6 (six) hours as needed for wheezing or shortness of breath. 12/25/17   [name], MD  aspirin 81 MG chewable tablet Chew 81 mg by mouth every morning.     [provider]  atorvastatin (LIPITOR) 80 MG tablet TAKE 1 TABLET BY MOUTH ONCE DAILY 08/10/17   [name], MD  betamethasone dipropionate (DIPROLENE) 0.05 % cream Apply topically 2 (two) times daily. 06/08/17   [name], MD  Blood Glucose Monitoring Suppl DEVI 1 Device by Does not apply route daily before breakfast. 05/23/14   Emokpae, Ejiroghene E, MD  buPROPion (WELLBUTRIN SR) 150 MG 12 hr  tablet TAKE 1 TABLET BY MOUTH TWICE DAILY 12/25/17   [name], MD  dextromethorphan (DELSYM) 30 MG/5ML liquid Take 5 mLs (30 mg total) by mouth as needed for cough. 02/24/17   Jule Ser, DO  doxycycline (VIBRAMYCIN) 100 MG capsule Take 1 capsule (100 mg total) by mouth 2 (two) times daily. 12/26/17   Varney Biles, MD  fluticasone (FLONASE) 50 MCG/ACT nasal spray Place 2 sprays into both nostrils daily. 02/24/17   Jule Ser, DO  glipiZIDE (GLUCOTROL) 10 MG tablet Take 1 tablet (10 mg total) by mouth daily. 08/17/17 08/17/18  [name], MD  Glucose Blood (BLOOD GLUCOSE TEST STRIPS) STRP 1 strip by In Vitro route daily before breakfast. 05/23/14   Emokpae, Ejiroghene E, MD  hydrocortisone 2.5 % cream Apply topically 2 (two) times daily. 08/25/17   Wieters, Hallie C, PA-C  Insulin Pen Needle (BD PEN NEEDLE NANO U/F) 32G X 4 MM MISC 1 Units/day by Does not apply route daily. 02/26/15   Loleta Chance, MD  Lancets MISC 1 Units by Does not apply route daily. 05/23/14   Emokpae, Ejiroghene E, MD  levothyroxine (SYNTHROID, LEVOTHROID) 150 MCG tablet TAKE ONE TABLET BY MOUTH ONCE DAILY BEFORE BREAST 06/09/17   [name], MD  losartan (COZAAR) 100 MG tablet Take 1 tablet (100 mg total) by mouth daily. 08/17/17 08/17/18  [name], MD  losartan (COZAAR) 100 MG tablet Take 1 tablet (100 mg total) by mouth daily. 12/25/17   [name], MD  metFORMIN (GLUCOPHAGE) 1000 MG tablet TAKE 1 TABLET BY MOUTH TWICE DAILY 12/25/17   [name], MD  metoprolol succinate (TOPROL-XL) 100 MG 24 hr tablet TAKE ONE TABLET BY MOUTH ONCE DAILY WITH  OR  IMMEDIATELY  FOLLOWING  A  MEAL 08/17/17   [name], MD  nitroGLYCERIN (NITROSTAT) 0.4 MG SL tablet Place 1 tablet (0.4 mg total) under the tongue every 5 (five) minutes as needed for chest pain. 05/13/17   [name], PA-C  Omega-3 Fatty Acids (FISH OIL) 1000 MG CAPS Take 1 capsule by mouth every morning.     [provider]  omeprazole (PRILOSEC) 40 MG capsule TAKE 1 CAPSULE BY MOUTH ONCE DAILY 11/30/17   Aldine Contes, MD  predniSONE (DELTASONE) 10 MG tablet Take 5 tablets (50 mg total) by mouth daily. 12/26/17   Varney Biles, MD  rOPINIRole (REQUIP) 1 MG tablet Take 1 tablet (1 mg total) by mouth at bedtime. 08/17/17   [name], MD  sertraline (ZOLOFT) 100 MG tablet Take 1.5 tablets (150 mg total) by mouth daily. 08/17/17   [name], MD  Spacer/Aero-Holding Chambers (AEROCHAMBER PLUS) inhaler Use as instructed 02/03/15   Melynda Ripple, MD  triamcinolone cream (KENALOG) 0.1 % Apply 1 application topically 2 (two) times daily. Patient taking differently: Apply 1 application topically 3 (three) times daily as needed (itching).  02/25/16   [name], MD    Family History Family History  Problem Relation Age of Onset  . Diabetes Mother   . Hypertension Mother     Social History Social History   Tobacco Use  . Smoking status: Current Every Day Smoker    Packs/day: 0.40    Years: 40.00    Pack years: 16.00    Types: Cigarettes    Last attempt to quit: 05/19/2014    Years since quitting: 3.6  . Smokeless tobacco: Never Used  . Tobacco comment: Trying to quit again-cutting back  Substance Use Topics  . Alcohol use: No    Alcohol/week: 0.0 standard drinks  . Drug use: Yes    Frequency: 7.0 times per week    Types: Marijuana    Comment: Marijuana Use     Allergies   Gabapentin; Nsaids; Opium; and Tramadol hcl   Review of Systems Review of Systems  Constitutional: Positive for activity change.  HENT: Positive for congestion, sore throat and voice change.   Respiratory: Positive for cough and shortness of breath.   Cardiovascular: Positive for chest pain.  Allergic/Immunologic: Negative for immunocompromised state.  Hematological: Does not bruise/bleed easily.  All other systems reviewed and are negative.    Physical Exam Updated Vital  Signs BP 124/68   Pulse 64   Temp 97.9 F (36.6 C) (Oral)   Resp 17   SpO2 95%   Physical Exam  Constitutional: She is oriented to person, place, and time. She appears well-developed.  HENT:  Head: Normocephalic and atraumatic.  Eyes: EOM are normal.  Neck: Normal range of motion. Neck supple.  Cardiovascular: Normal rate.  Pulmonary/Chest: Effort normal. No respiratory distress. She has no wheezes. She has no rales.  Abdominal: Soft. Bowel sounds are normal. There is no tenderness.  Neurological: She is alert and oriented to person, place, and time.  Skin: Skin is warm and dry.  Nursing note and vitals reviewed.    ED Treatments / Results  Labs (all labs ordered are listed, but only abnormal results are displayed) Labs Reviewed - No data to display  EKG None  Radiology Dg Chest 2 View  Result Date: 12/26/2017 CLINICAL DATA:  Cough for 2 weeks EXAM: CHEST - 2 VIEW COMPARISON:  02/17/2017 FINDINGS: The heart size and mediastinal contours are within normal limits. Both lungs are clear. Minimal aortic atherosclerosis. Degenerative changes of the spine. IMPRESSION: No active cardiopulmonary disease. Electronically Signed   By: Donavan Foil M.D.   On: 12/26/2017 00:01    Procedures Procedures (including critical care time)  Medications Ordered in ED Medications  ipratropium-albuterol (DUONEB) 0.5-2.5 (3) MG/3ML nebulizer solution 5 mL (5 mLs Nebulization Given 12/25/17 2345)  predniSONE (DELTASONE) tablet 60 mg (60 mg Oral Given 12/26/17 0147)     Initial Impression / Assessment and Plan / ED Course  I have reviewed the triage vital signs and the nursing notes.  Pertinent labs & imaging results that were available during my care of the patient were reviewed by me and considered in my medical decision making (see chart for details).     Patient comes in with chief complaint of cough. Patient has history of COPD, CAD and several other metabolic syndrome conditions.   She has been having URI-like symptoms with congestion and sore throat for the last 2  or 3 days with wheezing.  Patient also has a productive cough but no fevers.  Lung exam reveals poor aeration without any wheezing.  We will get x-ray and give her breathing treatment and reassess.  Final Clinical Impressions(s) / ED Diagnoses   Final diagnoses:  Viral URI with cough  COPD with acute exacerbation [facility])    ED Discharge Orders         Ordered    predniSONE (DELTASONE) 10 MG tablet  Daily     12/26/17 0136    doxycycline (VIBRAMYCIN) 100 MG capsule  2 times daily     12/26/17 0136           Varney Biles, MD 12/26/17 [id number]

## 2017-12-26 MED ORDER — DOXYCYCLINE HYCLATE 100 MG PO CAPS: 100.0000 mg | ORAL_CAPSULE | Freq: Two times a day (BID) | ORAL | 0 refills | Status: DC

## 2017-12-26 MED ORDER — PREDNISONE 10 MG PO TABS: 50.0000 mg | ORAL_TABLET | Freq: Every day | ORAL | 0 refills | Status: DC

## 2017-12-26 MED ORDER — PREDNISONE 20 MG PO TABS
60.0000 mg | ORAL_TABLET | Freq: Once | ORAL | Status: AC
  Administered 2017-12-26: 60 mg via ORAL
  Filled 2017-12-26: qty 3

## 2017-12-26 NOTE — Discharge Instructions (Addendum)
We saw in the ER for what appears to be a viral upper respiratory infection leading to cough and mild COPD exacerbation. Please start taking the prednisone that has been sent to your pharmacy. Take breathing treatments around the clock for the next 2 to 3 days.  Take the antibiotics only if you are not improving by Monday, and set up an appointment with your PCP next week.  Return to the ER immediately if your symptoms are getting worse.

## 2018-01-01 MED ORDER — ALBUTEROL SULFATE HFA 108 (90 BASE) MCG/ACT IN AERS: 1.0000 | INHALATION_SPRAY | Freq: Four times a day (QID) | RESPIRATORY_TRACT | 0 refills | Status: DC | PRN

## 2018-01-01 NOTE — Addendum Note (Signed)
Addended by: Velora Heckler on: 01/01/2018 02:26 PM   Modules accepted: Orders

## 2018-01-06 ENCOUNTER — Other Ambulatory Visit: Payer: Self-pay | Admitting: *Deleted

## 2018-01-06 MED ORDER — LEVOTHYROXINE SODIUM 150 MCG PO TABS: ORAL_TABLET | ORAL | 1 refills | Status: DC

## 2018-02-06 ENCOUNTER — Other Ambulatory Visit: Payer: Self-pay | Admitting: Internal Medicine

## 2018-02-06 DIAGNOSIS — I10 Essential (primary) hypertension: Secondary | ICD-10-CM

## 2018-02-15 ENCOUNTER — Encounter: Payer: Self-pay | Admitting: Internal Medicine

## 2018-02-15 ENCOUNTER — Ambulatory Visit: Payer: Self-pay | Admitting: Internal Medicine

## 2018-02-15 ENCOUNTER — Other Ambulatory Visit: Payer: Self-pay

## 2018-02-15 ENCOUNTER — Ambulatory Visit: Payer: Self-pay | Admitting: Dietician

## 2018-02-15 ENCOUNTER — Encounter: Payer: Self-pay | Admitting: Dietician

## 2018-02-15 VITALS — BP 125/71 | HR 75 | Temp 98.7°F | Wt 260.0 lb

## 2018-02-15 DIAGNOSIS — E118 Type 2 diabetes mellitus with unspecified complications: Secondary | ICD-10-CM

## 2018-02-15 DIAGNOSIS — I152 Hypertension secondary to endocrine disorders: Secondary | ICD-10-CM

## 2018-02-15 DIAGNOSIS — F17200 Nicotine dependence, unspecified, uncomplicated: Secondary | ICD-10-CM

## 2018-02-15 DIAGNOSIS — L209 Atopic dermatitis, unspecified: Secondary | ICD-10-CM

## 2018-02-15 DIAGNOSIS — I10 Essential (primary) hypertension: Secondary | ICD-10-CM

## 2018-02-15 DIAGNOSIS — Z720 Tobacco use: Secondary | ICD-10-CM

## 2018-02-15 DIAGNOSIS — E1159 Type 2 diabetes mellitus with other circulatory complications: Secondary | ICD-10-CM

## 2018-02-15 DIAGNOSIS — J040 Acute laryngitis: Secondary | ICD-10-CM

## 2018-02-15 DIAGNOSIS — Z7984 Long term (current) use of oral hypoglycemic drugs: Secondary | ICD-10-CM

## 2018-02-15 DIAGNOSIS — Z79899 Other long term (current) drug therapy: Secondary | ICD-10-CM

## 2018-02-15 LAB — POCT GLYCOSYLATED HEMOGLOBIN (HGB A1C): Hemoglobin A1C: 6.6 % — AB (ref 4.0–5.6)

## 2018-02-15 LAB — GLUCOSE, CAPILLARY: Glucose-Capillary: 147 mg/dL — ABNORMAL HIGH (ref 70–99)

## 2018-02-15 MED ORDER — BUPROPION HCL ER (SR) 150 MG PO TB12: 150.0000 mg | ORAL_TABLET | Freq: Two times a day (BID) | ORAL | 2 refills | Status: DC

## 2018-02-15 MED ORDER — TRIAMCINOLONE ACETONIDE 0.1 % EX CREA: 1.0000 "application " | TOPICAL_CREAM | Freq: Two times a day (BID) | CUTANEOUS | 3 refills | Status: DC

## 2018-02-15 MED ORDER — ATORVASTATIN CALCIUM 80 MG PO TABS: 80.0000 mg | ORAL_TABLET | Freq: Every day | ORAL | 1 refills | Status: DC

## 2018-02-15 MED ORDER — LORATADINE 10 MG PO TABS: 10.0000 mg | ORAL_TABLET | Freq: Every day | ORAL | 2 refills | Status: DC

## 2018-02-15 MED ORDER — BETAMETHASONE DIPROPIONATE 0.05 % EX CREA: TOPICAL_CREAM | Freq: Two times a day (BID) | CUTANEOUS | 2 refills | Status: AC

## 2018-02-15 NOTE — Progress Notes (Signed)
   CC: Diabetes follow-up  HPI:  [name] is a 57 y.o. female with past medical history outlined below here for diabetes follow-up. For the details of today's visit, please refer to the assessment and plan.  Past Medical History:  Diagnosis Date  . Allergic rhinitis   . Asthma   . CAD (coronary artery disease)    a. 12/13/10: mRCA 99%, EF 65-70%;  s/p BMS to mRCA  . COPD (chronic obstructive pulmonary disease) ([place])   . Depression   . DM2 (diabetes mellitus, type 2) ([place])   . GERD (gastroesophageal reflux disease)   . HTN (hypertension)   . Hyperlipidemia   . Hyperlipidemia   . Hypothyroidism   . Seasonal allergies   . Tobacco abuse     Review of Systems  HENT: Positive for congestion.   Respiratory: Negative for shortness of breath.   Cardiovascular: Negative for chest pain.    Physical Exam:  Vitals:   02/15/18 1314 02/15/18 1340  BP: (!) 158/69 125/71  Pulse: 80 75  Temp: 98.7 F (37.1 C)   TempSrc: Oral   SpO2: 96%   Weight: 260 lb (117.9 kg)     Constitutional: NAD, appears comfortable Cardiovascular: RRR, no murmurs, rubs, or gallops.  Pulmonary/Chest: CTAB, no wheezes, rales, or rhonchi.  Extremities: Warm and well perfused. No edema.  Psychiatric: Normal mood and affect  Assessment & Plan:   See Encounters Tab for problem based charting.  Patient discussed with Dr. Angelia Mould

## 2018-02-15 NOTE — Assessment & Plan Note (Signed)
Patient has severe diffuse atopic dermatitis.  Refilled triamcinolone cream 0.1% BID and high potency betamethasone cream 0.05% twice daily as needed for severe areas.  Advised her to use the high potency betamethasone cream sparingly.

## 2018-02-15 NOTE — Progress Notes (Signed)
Retinal images were done and transmitted today. Debera Lat, RD 02/15/2018 5:22 PM.

## 2018-02-15 NOTE — Patient Instructions (Signed)
Kendra Adams,  It was a pleasure to see you. Please continue to take all of your medications as previously prescribed. I have given you two different types of steroid creams. You may use the triamcinolone twice daily. Please use the betamethasone cream sparingly to areas that are severe or do not respond to triamcinolone.  Please restart your daily flonase spray. I have also started you on claritin. Please take this daily.   Make an appointment with your eye doctor to have your diabetic eye exam done.   Follow up with me again in 6 months or sooner if needed. If you have any questions or concerns, call our clinic at [phone] or after hours call [phone] and ask for the internal medicine resident on call. Thank you!  Dr. Philipp Ovens

## 2018-02-15 NOTE — Assessment & Plan Note (Signed)
Patient is here for diabetes follow-up.  A1c continues to improve after an increase in her glipizide positive dietary changes.  She denies signs or symptoms of hypoglycemia.  Hgb A1c is 6.6 today.  Foot exam today is normal. Rential scanner for DM retinopathy screening was done today in clinic.  --Continue metformin 1000 mg twice daily -Continue glipizide 10 mg daily --Follow-up 6 months

## 2018-02-15 NOTE — Assessment & Plan Note (Signed)
Patient is prescribed Wellbutrin for smoking cessation.  She continues to smoke 3 cigarettes a day but this is down from previously 1 pack/day.  She feels that it is helping with cravings and would like to continue medication. --Refilled Wellbutrin 150 mg twice daily

## 2018-02-15 NOTE — Progress Notes (Signed)
Retinal images were done and transmitted today. Debera Lat, RD 02/15/2018 2:41 PM.

## 2018-02-15 NOTE — Assessment & Plan Note (Signed)
Blood pressure today is well controlled, 125/71.  Losartan was increased at her last visit. --Continue losartan 100 mg daily -Continue metoprolol 20 g daily -Follow-up BMP

## 2018-02-15 NOTE — Assessment & Plan Note (Signed)
Patient has complaint of sinus congestion, drainage, and hoarse voice for the past 2 months.  She was seen in the ED and given a prescription for prednisone and doxycycline which did not help.  Discussed with patient that symptoms are unlikely to be infectious in nature.  She has discontinued her intranasal Flonase since her last visit.  She denies symptoms of acid reflux, but is currently on omeprazole 40 mg daily.  Advised smoking cessation.  Will resume Flonase and start cetirizine daily. --Restart Flonase 2 sprays each nostril daily -Start cetirizine 10 mg daily

## 2018-02-16 LAB — BMP8+ANION GAP
Anion Gap: 16 mmol/L (ref 10.0–18.0)
BUN/Creatinine Ratio: 9 (ref 9–23)
BUN: 9 mg/dL (ref 6–24)
CO2: 23 mmol/L (ref 20–29)
Calcium: 9.2 mg/dL (ref 8.7–10.2)
Chloride: 101 mmol/L (ref 96–106)
Creatinine, Ser: 0.99 mg/dL (ref 0.57–1.00)
GFR calc Af Amer: 72 mL/min/{1.73_m2} (ref >59)
GFR calc non Af Amer: 63 mL/min/{1.73_m2} (ref >59)
Glucose: 145 mg/dL — ABNORMAL HIGH (ref 65–99)
Potassium: 4.4 mmol/L (ref 3.5–5.2)
Sodium: 140 mmol/L (ref 134–144)

## 2018-02-17 NOTE — Progress Notes (Signed)
Internal Medicine Clinic Attending  Case discussed with Dr. Guilloud at the time of the visit.  We reviewed the resident's history and exam and pertinent patient test results.  I agree with the assessment, diagnosis, and plan of care documented in the resident's note.  

## 2018-03-12 ENCOUNTER — Encounter: Payer: Self-pay | Admitting: Dietician

## 2018-03-12 ENCOUNTER — Telehealth: Payer: Self-pay | Admitting: Dietician

## 2018-03-12 NOTE — Telephone Encounter (Signed)
Kendra Adams was informed of the results of her right eye image( No Diabetic retinopathy) and that the image was inadequate for her left eye.  She was offered the choice of coming in to try to get two adequate images or a referral to an eye doctor for a complete exam. She elected to go to her eye doctor saying she has vision insurance that may cover it. I agreed to mail her our fax number so she can give it to them to fax [place] the results.

## 2018-06-02 ENCOUNTER — Telehealth: Payer: Self-pay

## 2018-06-02 NOTE — Telephone Encounter (Signed)
rtc to pt, got message vmail not set up yet, will try again

## 2018-06-02 NOTE — Telephone Encounter (Signed)
Requesting to speak with a nurse about cough. Please call pt back.

## 2018-06-04 NOTE — Telephone Encounter (Signed)
No answer, no vmail

## 2018-06-26 ENCOUNTER — Other Ambulatory Visit: Payer: Self-pay | Admitting: Internal Medicine

## 2018-06-26 DIAGNOSIS — E118 Type 2 diabetes mellitus with unspecified complications: Secondary | ICD-10-CM

## 2018-06-26 DIAGNOSIS — F17200 Nicotine dependence, unspecified, uncomplicated: Secondary | ICD-10-CM

## 2018-07-01 ENCOUNTER — Other Ambulatory Visit: Payer: Self-pay | Admitting: Internal Medicine

## 2018-07-01 ENCOUNTER — Other Ambulatory Visit: Payer: Self-pay | Admitting: Family Medicine

## 2018-07-01 DIAGNOSIS — Z1231 Encounter for screening mammogram for malignant neoplasm of breast: Secondary | ICD-10-CM

## 2018-07-08 ENCOUNTER — Other Ambulatory Visit: Payer: Self-pay | Admitting: Internal Medicine

## 2018-07-08 DIAGNOSIS — E118 Type 2 diabetes mellitus with unspecified complications: Secondary | ICD-10-CM

## 2018-07-24 ENCOUNTER — Telehealth: Payer: Self-pay | Admitting: Oncology

## 2018-07-24 DIAGNOSIS — E118 Type 2 diabetes mellitus with unspecified complications: Secondary | ICD-10-CM

## 2018-07-24 DIAGNOSIS — F17200 Nicotine dependence, unspecified, uncomplicated: Secondary | ICD-10-CM

## 2018-07-26 NOTE — Telephone Encounter (Signed)
Thank you :)

## 2018-07-26 NOTE — Telephone Encounter (Signed)
Patient is scheduled first available with Dr. Philipp Ovens on 09/02/2018 at 1:15pm.  Appt card has been mailed.

## 2018-07-31 ENCOUNTER — Other Ambulatory Visit: Payer: Self-pay | Admitting: Oncology

## 2018-07-31 DIAGNOSIS — F17200 Nicotine dependence, unspecified, uncomplicated: Secondary | ICD-10-CM

## 2018-07-31 DIAGNOSIS — E118 Type 2 diabetes mellitus with unspecified complications: Secondary | ICD-10-CM

## 2018-08-05 ENCOUNTER — Encounter: Payer: Self-pay | Admitting: Internal Medicine

## 2018-08-16 ENCOUNTER — Telehealth: Payer: Self-pay | Admitting: Internal Medicine

## 2018-08-16 NOTE — Telephone Encounter (Signed)
Rectal Bleeding, stomach burning, Eating mostly soft foods.  Pt concerned and would like a call back.

## 2018-08-16 NOTE — Telephone Encounter (Signed)
Called pt, rectal bleeding, abd burning x 3 weeks, daily episodes, increasing daily. Not eating a lot, just not feeling well at all. appt [facility] 6/9 at 0915

## 2018-08-17 ENCOUNTER — Other Ambulatory Visit: Payer: Self-pay

## 2018-08-17 ENCOUNTER — Ambulatory Visit: Payer: Self-pay | Admitting: Internal Medicine

## 2018-08-17 VITALS — BP 113/72 | HR 87 | Temp 98.5°F | Ht 63.0 in | Wt 259.8 lb

## 2018-08-17 DIAGNOSIS — K625 Hemorrhage of anus and rectum: Secondary | ICD-10-CM

## 2018-08-17 DIAGNOSIS — Z86010 Personal history of colonic polyps: Secondary | ICD-10-CM

## 2018-08-17 DIAGNOSIS — K922 Gastrointestinal hemorrhage, unspecified: Secondary | ICD-10-CM

## 2018-08-17 LAB — CBC
HCT: 34.4 % — ABNORMAL LOW (ref 36.0–46.0)
Hemoglobin: 11 g/dL — ABNORMAL LOW (ref 12.0–15.0)
MCH: 28.9 pg (ref 26.0–34.0)
MCHC: 32 g/dL (ref 30.0–36.0)
MCV: 90.3 fL (ref 80.0–100.0)
Platelets: 219 10*3/uL (ref 150–400)
RBC: 3.81 MIL/uL — ABNORMAL LOW (ref 3.87–5.11)
RDW: 13.8 % (ref 11.5–15.5)
WBC: 7.8 10*3/uL (ref 4.0–10.5)
nRBC: 0 % (ref 0.0–0.2)

## 2018-08-17 LAB — POC HEMOCCULT BLD/STL (OFFICE/1-CARD/DIAGNOSTIC): Fecal Occult Blood, POC: POSITIVE — AB

## 2018-08-17 MED ORDER — SIMETHICONE 80 MG PO CHEW: 80.0000 mg | CHEWABLE_TABLET | Freq: Four times a day (QID) | ORAL | 1 refills | Status: DC | PRN

## 2018-08-17 NOTE — Progress Notes (Signed)
   CC: Rectal Bleeding   HPI:  [name] is a 58 y.o. F with PMHx listed below presenting for Rectal Bleeding. Please see the A&P for the status of the patient's chronic medical problems.  Past Medical History:  Diagnosis Date  . Allergic rhinitis   . Asthma   . CAD (coronary artery disease)    a. 12/13/10: mRCA 99%, EF 65-70%;  s/p BMS to mRCA  . COPD (chronic obstructive pulmonary disease) ([place])   . Depression   . DM2 (diabetes mellitus, type 2) ([place])   . GERD (gastroesophageal reflux disease)   . HTN (hypertension)   . Hyperlipidemia   . Hyperlipidemia   . Hypothyroidism   . Seasonal allergies   . Tobacco abuse    Review of Systems:  Performed and all others negative.  Physical Exam:  Vitals:   08/17/18 0905  BP: 113/72  Pulse: 87  Temp: 98.5 F (36.9 C)  TempSrc: Oral  SpO2: 97%  Weight: 259 lb 12.8 oz (117.8 kg)  Height: 5' 3"  (1.6 m)   Physical Exam Constitutional:      General: She is not in acute distress.    Appearance: Normal appearance. She is obese.  Cardiovascular:     Rate and Rhythm: Normal rate and regular rhythm.     Pulses: Normal pulses.     Heart sounds: Normal heart sounds.  Pulmonary:     Effort: Pulmonary effort is normal. No respiratory distress.     Breath sounds: Normal breath sounds.  Abdominal:     General: Bowel sounds are normal.     Palpations: Abdomen is soft.     Comments: Mild tenderness to deep palpation of umbilical region Mild-Moderate distention  Genitourinary:    Rectum: Normal. Guaiac result positive.  Musculoskeletal:        General: No swelling or deformity.  Skin:    General: Skin is warm and dry.  Neurological:     General: No focal deficit present.     Mental Status: Mental status is at baseline.    Assessment & Plan:   See Encounters Tab for problem based charting.  Patient discussed with Dr. Daryll Drown

## 2018-08-17 NOTE — Assessment & Plan Note (Addendum)
Patient presents with 1 month of rectal bleeding. She states she has bright red blood in there stool and some clots. She has tried tucks wipes, but not preparation H (due to concern for possible constriction of anal sphincter that she was told could occur.) She has had multiple soft BMs per day and eating will trigger a BM. She states she has also had a cramping feeling prior to her BMs, but now the feeling is painful and associated with nausea. She endorses fatigue and increasing waist size (pant fitting tighter). Denies fevers, chill, pain not associated with BMs.  For the past 3 weeks she has been experiencing SOB on exertion and for the past 2 weeks she notes some light headedness that will occur about 4 times per day and last for 1-2 minutes. She states that for the past 4-5 days she has been going to the bathroom even more frequently (10 times per day) and has had episodes where she had not been able to make it to the bathroom.  Review of her records shows a Colonoscopy in 2011 that showed diverticula, patchy erythema (sent for path), Polyps (sent for path), and small lipoma. Pathology revealed patchy active colitis with benign lymphoid aggregates at appendix, Ascending colon, and Sigmoid colon. These were ER and PR positive which rulled out endometriosis. The pattern is described as most consistent with Chron's disease, Drug induced (ex NSAIDs), and infection. No infectious Organisms were noted. Polyps were benign.  On exam anus and rectum are unremarkable, no obvious hemorrhoids or masses seen or palpated. Blood stool noted on glove, confirmed on hemoccult.   There was concern for symptomatic anemia given bleeding and lightheadedness and fatigue. Possibly due to colitis given history and presentation. Diverticular bleed though less likely due to ongoing nature (these are typically more self limited). Internal hemorrhoids remain a possibility. - STAT CBC showed Hgb stable at 11.0 (12.8 two years ago) -  Hemoccult Positive - Iron, Ferritin, TIBC  Given decreased but non emergent hemoglobin will not admit at this time. Will instead urgently refer to GI due acute nature of the problem with rectal bleeding and decreased hemoglobin. She may need to be initiated on treatment for chron's disease, which will need to be started by GI. We should be able to help manage this with GI recommendations as she is self pay and may not be able to afford additional visits. - Urgent referral to GI - Continue Omeprazole for GERD - Start Simethicone for bloating and Preparation H for possibility hemorroids  ADDENDUM: Iron is low end of normal at 46 and Ferritin is low end of normal at 20. Patient will benefit from Iron supplementation, but this should be deferred until after her current GI issues (Diarrhea, Rectal bleeding) are worked up due to GI side effects.  - Ferrous Sulfate 371m QoD, Start after cleared by Doctor (GI or Our office) after work up of current acute GI problems  No answer on first attempt at calling patient, routed to triage and letter will be sent.

## 2018-08-17 NOTE — Patient Instructions (Addendum)
Thank you for allowing [place] to care for you  For your rectal bleeding and abdominal pain - This may be due to hemorrhoids or Diverticula or Colitis - We have made a referral for you to see a GI doctor - We have prescribed medication for bloating - Please try preparation H to see if this helps your symptoms   Hemorrhoids Hemorrhoids are swollen veins in and around the rectum or anus. There are two types of hemorrhoids:  Internal hemorrhoids. These occur in the veins that are just inside the rectum. They may poke through to the outside and become irritated and painful.  External hemorrhoids. These occur in the veins that are outside the anus and can be felt as a painful swelling or hard lump near the anus. Most hemorrhoids do not cause serious problems, and they can be managed with home treatments such as diet and lifestyle changes. If home treatments do not help the symptoms, procedures can be done to shrink or remove the hemorrhoids. What are the causes? This condition is caused by increased pressure in the anal area. This pressure may result from various things, including:  Constipation.  Straining to have a bowel movement.  Diarrhea.  Pregnancy.  Obesity.  Sitting for long periods of time.  Heavy lifting or other activity that causes you to strain.  Anal sex.  Riding a bike for a long period of time. What are the signs or symptoms? Symptoms of this condition include:  Pain.  Anal itching or irritation.  Rectal bleeding.  Leakage of stool (feces).  Anal swelling.  One or more lumps around the anus. How is this diagnosed? This condition can often be diagnosed through a visual exam. Other exams or tests may also be done, such as:  An exam that involves feeling the rectal area with a gloved hand (digital rectal exam).  An exam of the anal canal that is done using a small tube (anoscope).  A blood test, if you have lost a significant amount of blood.  A test to  look inside the colon using a flexible tube with a camera on the end (sigmoidoscopy or colonoscopy). How is this treated? This condition can usually be treated at home. However, various procedures may be done if dietary changes, lifestyle changes, and other home treatments do not help your symptoms. These procedures can help make the hemorrhoids smaller or remove them completely. Some of these procedures involve surgery, and others do not. Common procedures include:  Rubber band ligation. Rubber bands are placed at the base of the hemorrhoids to cut off their blood supply.  Sclerotherapy. Medicine is injected into the hemorrhoids to shrink them.  Infrared coagulation. A type of light energy is used to get rid of the hemorrhoids.  Hemorrhoidectomy surgery. The hemorrhoids are surgically removed, and the veins that supply them are tied off.  Stapled hemorrhoidopexy surgery. The surgeon staples the base of the hemorrhoid to the rectal wall. Follow these instructions at home: Eating and drinking   Eat foods that have a lot of fiber in them, such as whole grains, beans, nuts, fruits, and vegetables.  Ask your health care provider about taking products that have added fiber (fiber supplements).  Reduce the amount of fat in your diet. You can do this by eating low-fat dairy products, eating less red meat, and avoiding processed foods.  Drink enough fluid to keep your urine pale yellow. Managing pain and swelling   Take warm sitz baths for 20 minutes, 3-4 times  a day to ease pain and discomfort. You may do this in a bathtub or using a portable sitz bath that fits over the toilet.  If directed, apply ice to the affected area. Using ice packs between sitz baths may be helpful. ? Put ice in a plastic bag. ? Place a towel between your skin and the bag. ? Leave the ice on for 20 minutes, 2-3 times a day. General instructions  Take over-the-counter and prescription medicines only as told by your  health care provider.  Use medicated creams or suppositories as told.  Get regular exercise. Ask your health care provider how much and what kind of exercise is best for you. In general, you should do moderate exercise for at least 30 minutes on most days of the week (150 minutes each week). This can include activities such as walking, biking, or yoga.  Go to the bathroom when you have the urge to have a bowel movement. Do not wait.  Avoid straining to have bowel movements.  Keep the anal area dry and clean. Use wet toilet paper or moist towelettes after a bowel movement.  Do not sit on the toilet for long periods of time. This increases blood pooling and pain.  Keep all follow-up visits as told by your health care provider. This is important. Contact a health care provider if you have:  Increasing pain and swelling that are not controlled by treatment or medicine.  Difficulty having a bowel movement, or you are unable to have a bowel movement.  Pain or inflammation outside the area of the hemorrhoids. Get help right away if you have:  Uncontrolled bleeding from your rectum. Summary  Hemorrhoids are swollen veins in and around the rectum or anus.  Most hemorrhoids can be managed with home treatments such as diet and lifestyle changes.  Taking warm sitz baths can help ease pain and discomfort.  In severe cases, procedures or surgery can be done to shrink or remove the hemorrhoids. This information is not intended to replace advice given to you by your health care provider. Make sure you discuss any questions you have with your health care provider. Document Released: 02/22/2000 Document Revised: 07/16/2017 Document Reviewed: 07/16/2017 Elsevier Interactive Patient Education  2019 [employer].

## 2018-08-18 ENCOUNTER — Telehealth: Payer: Self-pay | Admitting: Internal Medicine

## 2018-08-18 ENCOUNTER — Encounter: Payer: Self-pay | Admitting: Internal Medicine

## 2018-08-18 NOTE — Telephone Encounter (Signed)
Thank you :)

## 2018-08-18 NOTE — Progress Notes (Signed)
Internal Medicine Clinic Attending  Case discussed with Dr. Melvin  at the time of the visit.  We reviewed the resident's history and exam and pertinent patient test results.  I agree with the assessment, diagnosis, and plan of care documented in the resident's note.  

## 2018-08-18 NOTE — Telephone Encounter (Signed)
Pt miss Dr Trilby Drummer call, pls contact pt [phone]

## 2018-08-18 NOTE — Telephone Encounter (Signed)
(  copied and pasted from previous encounter)  Kendra Seat, MD   08/18/18 10:17 AM  Note    Called patient to inform her of results of Iron studies.  Iron is low end of normal at 46 and Ferritin is low end of normal at 20. Patient will benefit from Iron supplementation, but this should be deferred until after her current GI issues (Diarrhea, Rectal bleeding) are worked up due to GI side effects.  - Ferrous Sulfate 317m QoD, Start after cleared by Doctor (GI or Our office) after work up of current acute GI problems  No answer on first attempt.     Return call to patient- given information above.  Pt verbalized understanding-also wanted to inform MD that she had abdominal pain last night, but none today "not yet".  Also states that she had a brief episode last night where she felt "feverish" and had a temp of 99.5, but no fever today.  Pt informed that GI referral was sent to Dr MCollene Maresand she will need to keep that appt.  [facility] .Regenia Skeeter Toyoko Silos Cassady6/10/202012:04 PM

## 2018-08-18 NOTE — Telephone Encounter (Signed)
Pt aware-documented in separate encounter.Kendra Adams, Kendra Sundberg Cassady6/10/202012:06 PM

## 2018-08-18 NOTE — Telephone Encounter (Signed)
Called patient to inform her of results of Iron studies.  Iron is low end of normal at 46 and Ferritin is low end of normal at 20. Patient will benefit from Iron supplementation, but this should be deferred until after her current GI issues (Diarrhea, Rectal bleeding) are worked up due to GI side effects.  - Ferrous Sulfate 31m QoD, Start after cleared by Doctor (GI or Our office) after work up of current acute GI problems  No answer on first attempt.

## 2018-08-18 NOTE — Progress Notes (Signed)
Iron is low end of normal at 46 and Ferritin is low end of normal at 20. Patient will benefit from Iron supplementation, but this should be deferred until after her current GI issues (Diarrhea, Rectal bleeding) are worked up due to GI side effects.  - Ferrous Sulfate 378m QoD, Start after cleared by Doctor (GI or Our office) after work up of current acute GI problems  Attempted to call patient, will send this letter and route to triage.

## 2018-08-19 LAB — CBC

## 2018-08-19 LAB — SPECIMEN STATUS REPORT

## 2018-08-20 LAB — FERRITIN: Ferritin: 20 ng/mL (ref 15–150)

## 2018-08-20 LAB — IRON AND TIBC
Iron Saturation: 15 % (ref 15–55)
Iron: 46 ug/dL (ref 27–159)
Total Iron Binding Capacity: 313 ug/dL (ref 250–450)
UIBC: 267 ug/dL (ref 131–425)

## 2018-08-20 LAB — CBC

## 2018-08-21 ENCOUNTER — Other Ambulatory Visit: Payer: Self-pay | Admitting: Internal Medicine

## 2018-08-21 DIAGNOSIS — I2510 Atherosclerotic heart disease of native coronary artery without angina pectoris: Secondary | ICD-10-CM

## 2018-08-21 DIAGNOSIS — F17200 Nicotine dependence, unspecified, uncomplicated: Secondary | ICD-10-CM

## 2018-08-21 DIAGNOSIS — I10 Essential (primary) hypertension: Secondary | ICD-10-CM

## 2018-08-24 ENCOUNTER — Encounter: Payer: Self-pay | Admitting: Internal Medicine

## 2018-08-26 ENCOUNTER — Encounter: Payer: Self-pay | Admitting: Internal Medicine

## 2018-08-27 ENCOUNTER — Encounter: Payer: Self-pay | Admitting: Internal Medicine

## 2018-08-27 NOTE — Telephone Encounter (Signed)
Placed call to patient to discuss the MyChart message we received. No answer. Left message on VM that patient may need to seek immediate care at [facility] or ED if she is feeling that poorly. Will also send MyChart message back to patient.

## 2018-08-30 ENCOUNTER — Ambulatory Visit: Payer: Self-pay

## 2018-08-30 ENCOUNTER — Other Ambulatory Visit: Payer: Self-pay

## 2018-09-02 ENCOUNTER — Ambulatory Visit (INDEPENDENT_AMBULATORY_CARE_PROVIDER_SITE_OTHER): Payer: Self-pay | Admitting: Internal Medicine

## 2018-09-02 ENCOUNTER — Other Ambulatory Visit: Payer: Self-pay

## 2018-09-02 ENCOUNTER — Encounter: Payer: Self-pay | Admitting: Internal Medicine

## 2018-09-02 VITALS — BP 122/71 | HR 88 | Temp 99.1°F | Ht 63.0 in | Wt 259.2 lb

## 2018-09-02 DIAGNOSIS — R42 Dizziness and giddiness: Secondary | ICD-10-CM

## 2018-09-02 DIAGNOSIS — E1159 Type 2 diabetes mellitus with other circulatory complications: Secondary | ICD-10-CM

## 2018-09-02 DIAGNOSIS — I152 Hypertension secondary to endocrine disorders: Secondary | ICD-10-CM

## 2018-09-02 DIAGNOSIS — E118 Type 2 diabetes mellitus with unspecified complications: Secondary | ICD-10-CM

## 2018-09-02 DIAGNOSIS — E039 Hypothyroidism, unspecified: Secondary | ICD-10-CM

## 2018-09-02 DIAGNOSIS — Z79899 Other long term (current) drug therapy: Secondary | ICD-10-CM

## 2018-09-02 DIAGNOSIS — I10 Essential (primary) hypertension: Secondary | ICD-10-CM

## 2018-09-02 DIAGNOSIS — Z8669 Personal history of other diseases of the nervous system and sense organs: Secondary | ICD-10-CM

## 2018-09-02 DIAGNOSIS — K625 Hemorrhage of anus and rectum: Secondary | ICD-10-CM

## 2018-09-02 DIAGNOSIS — Z7984 Long term (current) use of oral hypoglycemic drugs: Secondary | ICD-10-CM

## 2018-09-02 DIAGNOSIS — E038 Other specified hypothyroidism: Secondary | ICD-10-CM

## 2018-09-02 DIAGNOSIS — Z79890 Hormone replacement therapy (postmenopausal): Secondary | ICD-10-CM

## 2018-09-02 DIAGNOSIS — R740 Nonspecific elevation of levels of transaminase and lactic acid dehydrogenase [LDH]: Secondary | ICD-10-CM

## 2018-09-02 DIAGNOSIS — E785 Hyperlipidemia, unspecified: Secondary | ICD-10-CM

## 2018-09-02 DIAGNOSIS — R531 Weakness: Secondary | ICD-10-CM

## 2018-09-02 DIAGNOSIS — R7401 Elevation of levels of liver transaminase levels: Secondary | ICD-10-CM

## 2018-09-02 LAB — CBC WITH DIFFERENTIAL/PLATELET
Abs Immature Granulocytes: 0.05 10*3/uL (ref 0.00–0.07)
Basophils Absolute: 0.1 10*3/uL (ref 0.0–0.1)
Basophils Relative: 1 %
Eosinophils Absolute: 1.1 10*3/uL — ABNORMAL HIGH (ref 0.0–0.5)
Eosinophils Relative: 10 %
HCT: 32.1 % — ABNORMAL LOW (ref 36.0–46.0)
Hemoglobin: 10.2 g/dL — ABNORMAL LOW (ref 12.0–15.0)
Immature Granulocytes: 1 %
Lymphocytes Relative: 17 %
Lymphs Abs: 1.9 10*3/uL (ref 0.7–4.0)
MCH: 29.1 pg (ref 26.0–34.0)
MCHC: 31.8 g/dL (ref 30.0–36.0)
MCV: 91.7 fL (ref 80.0–100.0)
Monocytes Absolute: 1.2 10*3/uL — ABNORMAL HIGH (ref 0.1–1.0)
Monocytes Relative: 11 %
Neutro Abs: 6.6 10*3/uL (ref 1.7–7.7)
Neutrophils Relative %: 60 %
Platelets: 257 10*3/uL (ref 150–400)
RBC: 3.5 MIL/uL — ABNORMAL LOW (ref 3.87–5.11)
RDW: 14.1 % (ref 11.5–15.5)
WBC: 10.8 10*3/uL — ABNORMAL HIGH (ref 4.0–10.5)
nRBC: 0 % (ref 0.0–0.2)

## 2018-09-02 LAB — COMPREHENSIVE METABOLIC PANEL
ALT: 15 U/L (ref 0–44)
AST: 14 U/L — ABNORMAL LOW (ref 15–41)
Albumin: 3.1 g/dL — ABNORMAL LOW (ref 3.5–5.0)
Alkaline Phosphatase: 87 U/L (ref 38–126)
Anion gap: 10 (ref 5–15)
BUN: 11 mg/dL (ref 6–20)
CO2: 26 mmol/L (ref 22–32)
Calcium: 9.3 mg/dL (ref 8.9–10.3)
Chloride: 103 mmol/L (ref 98–111)
Creatinine, Ser: 0.94 mg/dL (ref 0.44–1.00)
GFR calc Af Amer: >60 mL/min (ref >60)
GFR calc non Af Amer: >60 mL/min (ref >60)
Glucose, Bld: 136 mg/dL — ABNORMAL HIGH (ref 70–99)
Potassium: 4.3 mmol/L (ref 3.5–5.1)
Sodium: 139 mmol/L (ref 135–145)
Total Bilirubin: 0.5 mg/dL (ref 0.3–1.2)
Total Protein: 6.2 g/dL — ABNORMAL LOW (ref 6.5–8.1)

## 2018-09-02 LAB — POCT GLYCOSYLATED HEMOGLOBIN (HGB A1C): Hemoglobin A1C: 7.2 % — AB (ref 4.0–5.6)

## 2018-09-02 LAB — LIPID PANEL
Cholesterol: 114 mg/dL (ref 0–200)
HDL: 32 mg/dL — ABNORMAL LOW (ref >40)
LDL Cholesterol: 63 mg/dL (ref 0–99)
Total CHOL/HDL Ratio: 3.6 RATIO
Triglycerides: 93 mg/dL (ref <150)
VLDL: 19 mg/dL (ref 0–40)

## 2018-09-02 LAB — GLUCOSE, CAPILLARY: Glucose-Capillary: 130 mg/dL — ABNORMAL HIGH (ref 70–99)

## 2018-09-02 LAB — TSH: TSH: 5.516 u[IU]/mL — ABNORMAL HIGH (ref 0.350–4.500)

## 2018-09-02 MED ORDER — METFORMIN HCL 1000 MG PO TABS: 1000.0000 mg | ORAL_TABLET | Freq: Two times a day (BID) | ORAL | 11 refills | Status: DC

## 2018-09-02 MED ORDER — LOSARTAN POTASSIUM 100 MG PO TABS: 100.0000 mg | ORAL_TABLET | Freq: Every day | ORAL | 1 refills | Status: DC

## 2018-09-02 MED ORDER — SERTRALINE HCL 100 MG PO TABS: 150.0000 mg | ORAL_TABLET | Freq: Every day | ORAL | 11 refills | Status: DC

## 2018-09-02 MED ORDER — ROPINIROLE HCL 1 MG PO TABS: 1.0000 mg | ORAL_TABLET | Freq: Every day | ORAL | 3 refills | Status: DC

## 2018-09-02 NOTE — Patient Instructions (Signed)
Ms. Arrie Aran,  It was a pleasure to see you. I am sorry you are not feeling well. Please keep your appointment with your GI doctor on Tuesday. If you develop worsening bleeding or symptoms of dizziness, chest pain, or shortness of breath, please go to the ER for evaluation.   Follow up with me again in 3 months.  If you have any questions or concerns, call our clinic at [phone] or after hours call [phone] and ask for the internal medicine resident on call.   Thank you!  Dr. Philipp Ovens

## 2018-09-02 NOTE — Assessment & Plan Note (Signed)
Currently on Synthroid 150 mcg daily. Checking TSH, if normal will send refills to pharmacy. She has been stable on this dose for many years.

## 2018-09-02 NOTE — Assessment & Plan Note (Signed)
Hemoglobin A1c 7.2, near goal. She is compliant with her metformin and glipizide. Did not bring her glucometer today.  -- Continue metformin 1,000 mg BID -- Continue Glipizide 10 mg daily  -- F/u 3 months

## 2018-09-02 NOTE — Assessment & Plan Note (Signed)
Patient has a very high ASCVD risk (20%) based on last lipid panel. Cardiology had recommended vascepa for mildly elevated triglycerides. Unfortunately she is self pay and cannot afford this. She is currently on Lipitor 80 mg daily. She has not been very receptive to lifestyle changes in the past, will continue to counsel.  -- Repeat lipid panel

## 2018-09-02 NOTE — Assessment & Plan Note (Signed)
Well controlled, 122/71.  -- Refilled losartan 100 mg daily -- Continue metoprolol 100 mg daily  -- Checking BMP today

## 2018-09-02 NOTE — Assessment & Plan Note (Addendum)
Patient has continued to have rectal bleeding since her last visit. She reports 5-6 soft, bloody BMs a day. She as associated abdominal discomfort, nausea, and decreased appetite. She also reports fatigue, shortness of breath, and dizziness. Work up for this was initiated at her last visit and she as an appointment with her Gastroenterologist on 6/30. Prior colonoscopy w/ biopsies in 2011 demonstrated patchy colitis in her appendix, ascending, and sigmoid colon. Pathology was consistent with crohn's disease vs. Drug induced injury vs. Infection. She has not had a repeat scope since. With her current symptoms, I am concerned for recurrent colitis. Will check STAT labs today to ensure she does not need to be admitted for expedited work up.  -- F/u CBC -- GI appt. Scheduled 6/30   ADDENDUM: CBC with slight drop in hemoglobin, 11 -> 10. She is hemodynamically stable. No indication for admission at this time. Instructed patient to present to ER if she develops worsening bleeding and to follow up with GI as scheduled.

## 2018-09-02 NOTE — Progress Notes (Signed)
   CC: DM follow up & rectal bleeding  HPI:  [name] is a 58 y.o. female with past medical history outlined below here for DM follow up & rectal bleeding. For the details of today's visit, please refer to the assessment and plan.  Past Medical History:  Diagnosis Date  . Allergic rhinitis   . Asthma   . CAD (coronary artery disease)    a. 12/13/10: mRCA 99%, EF 65-70%;  s/p BMS to mRCA  . COPD (chronic obstructive pulmonary disease) ([place])   . Depression   . DM2 (diabetes mellitus, type 2) ([place])   . GERD (gastroesophageal reflux disease)   . HTN (hypertension)   . Hyperlipidemia   . Hyperlipidemia   . Hypothyroidism   . Seasonal allergies   . Tobacco abuse     Review of Systems  Respiratory: Positive for shortness of breath.   Gastrointestinal: Positive for abdominal pain, blood in stool, melena and nausea.  Neurological: Positive for dizziness and weakness.    Physical Exam:  Vitals:   09/02/18 1315  BP: 122/71  Pulse: 88  Temp: 99.1 F (37.3 C)  TempSrc: Oral  SpO2: 95%  Weight: 259 lb 3.2 oz (117.6 kg)  Height: 5' 3"  (1.6 m)    Constitutional: NAD, appears comfortable Cardiovascular: RRR, no m/r/g Pulmonary/Chest: CTAB, no wheezes, rales, or rhonchi.  Abdominal: Soft, non tender, non distended. +BS.  Extremities: Warm and well perfused. No edema.  Psychiatric: Normal mood and affect  Assessment & Plan:   See Encounters Tab for problem based charting.  Patient discussed with Dr. Angelia Mould

## 2018-09-03 NOTE — Progress Notes (Signed)
Internal Medicine Clinic Attending  Case discussed with Dr. Guilloud at the time of the visit.  We reviewed the resident's history and exam and pertinent patient test results.  I agree with the assessment, diagnosis, and plan of care documented in the resident's note.  

## 2018-09-11 ENCOUNTER — Inpatient Hospital Stay (HOSPITAL_COMMUNITY)
Admission: EM | Admit: 2018-09-11 | Discharge: 2018-09-15 | DRG: 386 | Disposition: A | Discharge status: Home / Self Care | Payer: Self-pay | Attending: Internal Medicine | Admitting: Internal Medicine

## 2018-09-11 ENCOUNTER — Other Ambulatory Visit: Payer: Self-pay

## 2018-09-11 ENCOUNTER — Encounter (HOSPITAL_COMMUNITY): Payer: Self-pay | Admitting: *Deleted

## 2018-09-11 DIAGNOSIS — Z793 Long term (current) use of hormonal contraceptives: Secondary | ICD-10-CM

## 2018-09-11 DIAGNOSIS — D62 Acute posthemorrhagic anemia: Secondary | ICD-10-CM | POA: Diagnosis present

## 2018-09-11 DIAGNOSIS — F17210 Nicotine dependence, cigarettes, uncomplicated: Secondary | ICD-10-CM | POA: Diagnosis present

## 2018-09-11 DIAGNOSIS — I700 Atherosclerosis of aorta: Secondary | ICD-10-CM | POA: Diagnosis present

## 2018-09-11 DIAGNOSIS — J449 Chronic obstructive pulmonary disease, unspecified: Secondary | ICD-10-CM | POA: Diagnosis present

## 2018-09-11 DIAGNOSIS — G8929 Other chronic pain: Secondary | ICD-10-CM | POA: Diagnosis present

## 2018-09-11 DIAGNOSIS — E876 Hypokalemia: Secondary | ICD-10-CM | POA: Diagnosis present

## 2018-09-11 DIAGNOSIS — K219 Gastro-esophageal reflux disease without esophagitis: Secondary | ICD-10-CM | POA: Diagnosis present

## 2018-09-11 DIAGNOSIS — E785 Hyperlipidemia, unspecified: Secondary | ICD-10-CM | POA: Diagnosis present

## 2018-09-11 DIAGNOSIS — K50914 Crohn's disease, unspecified, with abscess: Principal | ICD-10-CM | POA: Diagnosis present

## 2018-09-11 DIAGNOSIS — Z86010 Personal history of colonic polyps: Secondary | ICD-10-CM

## 2018-09-11 DIAGNOSIS — E8809 Other disorders of plasma-protein metabolism, not elsewhere classified: Secondary | ICD-10-CM | POA: Diagnosis present

## 2018-09-11 DIAGNOSIS — Z8379 Family history of other diseases of the digestive system: Secondary | ICD-10-CM

## 2018-09-11 DIAGNOSIS — K625 Hemorrhage of anus and rectum: Secondary | ICD-10-CM | POA: Diagnosis present

## 2018-09-11 DIAGNOSIS — Z79899 Other long term (current) drug therapy: Secondary | ICD-10-CM

## 2018-09-11 DIAGNOSIS — J302 Other seasonal allergic rhinitis: Secondary | ICD-10-CM | POA: Diagnosis present

## 2018-09-11 DIAGNOSIS — E039 Hypothyroidism, unspecified: Secondary | ICD-10-CM | POA: Diagnosis present

## 2018-09-11 DIAGNOSIS — Z6841 Body Mass Index (BMI) 40.0 and over, adult: Secondary | ICD-10-CM

## 2018-09-11 DIAGNOSIS — Z20828 Contact with and (suspected) exposure to other viral communicable diseases: Secondary | ICD-10-CM | POA: Diagnosis present

## 2018-09-11 DIAGNOSIS — G2581 Restless legs syndrome: Secondary | ICD-10-CM | POA: Diagnosis present

## 2018-09-11 DIAGNOSIS — Z7951 Long term (current) use of inhaled steroids: Secondary | ICD-10-CM

## 2018-09-11 DIAGNOSIS — Z888 Allergy status to other drugs, medicaments and biological substances status: Secondary | ICD-10-CM

## 2018-09-11 DIAGNOSIS — I2510 Atherosclerotic heart disease of native coronary artery without angina pectoris: Secondary | ICD-10-CM | POA: Diagnosis present

## 2018-09-11 DIAGNOSIS — F329 Major depressive disorder, single episode, unspecified: Secondary | ICD-10-CM | POA: Diagnosis present

## 2018-09-11 DIAGNOSIS — Z886 Allergy status to analgesic agent status: Secondary | ICD-10-CM

## 2018-09-11 DIAGNOSIS — I10 Essential (primary) hypertension: Secondary | ICD-10-CM | POA: Diagnosis present

## 2018-09-11 DIAGNOSIS — Z955 Presence of coronary angioplasty implant and graft: Secondary | ICD-10-CM

## 2018-09-11 DIAGNOSIS — Z7982 Long term (current) use of aspirin: Secondary | ICD-10-CM

## 2018-09-11 DIAGNOSIS — M549 Dorsalgia, unspecified: Secondary | ICD-10-CM | POA: Diagnosis present

## 2018-09-11 DIAGNOSIS — Z885 Allergy status to narcotic agent status: Secondary | ICD-10-CM

## 2018-09-11 DIAGNOSIS — K922 Gastrointestinal hemorrhage, unspecified: Secondary | ICD-10-CM

## 2018-09-11 DIAGNOSIS — E1165 Type 2 diabetes mellitus with hyperglycemia: Secondary | ICD-10-CM | POA: Diagnosis present

## 2018-09-11 DIAGNOSIS — R509 Fever, unspecified: Secondary | ICD-10-CM

## 2018-09-11 DIAGNOSIS — Z8249 Family history of ischemic heart disease and other diseases of the circulatory system: Secondary | ICD-10-CM

## 2018-09-11 DIAGNOSIS — Z9049 Acquired absence of other specified parts of digestive tract: Secondary | ICD-10-CM

## 2018-09-11 DIAGNOSIS — Z833 Family history of diabetes mellitus: Secondary | ICD-10-CM

## 2018-09-11 DIAGNOSIS — Z7984 Long term (current) use of oral hypoglycemic drugs: Secondary | ICD-10-CM

## 2018-09-11 LAB — CBC
HCT: 29.4 % — ABNORMAL LOW (ref 36.0–46.0)
Hemoglobin: 9.5 g/dL — ABNORMAL LOW (ref 12.0–15.0)
MCH: 28.7 pg (ref 26.0–34.0)
MCHC: 32.3 g/dL (ref 30.0–36.0)
MCV: 88.8 fL (ref 80.0–100.0)
Platelets: 351 10*3/uL (ref 150–400)
RBC: 3.31 MIL/uL — ABNORMAL LOW (ref 3.87–5.11)
RDW: 13.5 % (ref 11.5–15.5)
WBC: 14.5 10*3/uL — ABNORMAL HIGH (ref 4.0–10.5)
nRBC: 0.2 % (ref 0.0–0.2)

## 2018-09-11 NOTE — ED Triage Notes (Addendum)
Pt is scheduled for colonoscopy on Monday. Has had steady fever; was taking aleve but was told not to take tylenol or aleve. Pt has been having rectal bleeding as well and has been told her blood counts are a little low. Reports dizziness when sitting

## 2018-09-11 NOTE — ED Provider Notes (Signed)
[facility] EMERGENCY DEPARTMENT Provider Note   CSN: [id number] Arrival date & time: 09/11/18  2257    History   Chief Complaint Chief Complaint  Patient presents with   Fever    HPI Kendra Adams is a 58 y.o. female.   The history is provided by the patient.  Fever She has history of hypertension, hyperlipidemia, COPD, coronary artery disease, asthma and has been running a fever for the last week.  Temperature was as high as 103 degrees.  She has been taking naproxen for fever control, but none for the last 2 days.  She denies sore throat, rhinorrhea.  She has a chronic cough from postnasal drainage which is unchanged.  She denies chest pain or dyspnea.  She has had some nausea and vomiting.  She has been having rectal bleeding for about the last 6 weeks and has even passed some clots.  She has had some mid abdominal cramping associated with this.  She saw her gastroenterologist last week and is scheduled for colonoscopy in 2 days.  She has had generalized body aches.  She denies change in sense of smell or taste.  She does relate some dizziness when she stands up.  She is complaining of a generalized headache tonight.  She came in tonight because temperature hit 103.  She denies any sick contacts and specifically denies exposure to COVID-19.  Past Medical History:  Diagnosis Date   Allergic rhinitis    Asthma    CAD (coronary artery disease)    a. 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   Psoriasis 10/15/2015   Status post laparoscopic cholecystectomy 08/12/2015   Elevated transaminase level 08/12/2015   Positive fecal occult blood test 07/10/2015   Tendinitis of left rotator cuff 01/19/2015   Tobacco use disorder 09/07/2014   Morbid obesity ([place]) 03/19/2011   Routine health maintenance 02/03/2011   CAD (coronary artery disease) 12/26/2010   Diabetes mellitus type 2 with complications ([place]) 47/42/5956   Hypertension associated with diabetes ([place]) 05/10/2008   Hypothyroidism 04/27/2008   HLD (hyperlipidemia) 04/27/2008   Depression 04/27/2008   GERD 04/27/2008    Past Surgical History:  Procedure Laterality Date   CARDIAC CATHETERIZATION N/A 08/14/2015   Procedure: Left Heart Cath and Coronary Angiography;  Surgeon: Peter M Martinique, MD;  Location: [facility];  Service: Cardiovascular;  Laterality: N/A;   CARPAL TUNNEL RELEASE     w/ bone spurs   CHOLECYSTECTOMY N/A 08/15/2015   Procedure: LAPAROSCOPIC CHOLECYSTECTOMY WITH ATTEMPTED INTRAOPERATIVE CHOLANGIOGRAM;  Surgeon: Erroll Luna, MD;  Location: [facility];  Service: General;  Laterality: N/A;   CORONARY ANGIOPLASTY WITH STENT PLACEMENT     DILATION AND CURETTAGE OF UTERUS       OB History    Gravida  2   Para  Term      Preterm      AB  2   Living        SAB  1   TAB  1   Ectopic      Multiple      Live Births               Home Medications    Prior to Admission medications   Medication Sig Start Date End Date Taking? Authorizing Provider  albuterol (PROVENTIL HFA;VENTOLIN HFA) 108 (90 Base) MCG/ACT inhaler Inhale 1-2 puffs into the lungs every 6 (six) hours as needed for wheezing or shortness of breath. 01/01/18   [name], MD  aspirin 81 MG chewable tablet Chew 81 mg by mouth every morning.     [provider]  atorvastatin (LIPITOR) 80 MG tablet Take 1 tablet (80 mg total) by mouth daily. 02/15/18   [name], MD  betamethasone dipropionate (DIPROLENE) 0.05 % cream Apply topically 2 (two) times daily. 02/15/18   [name], MD  Blood Glucose Monitoring Suppl DEVI 1 Device by Does not apply route daily before breakfast. 05/23/14   Emokpae, Ejiroghene E, MD  buPROPion (WELLBUTRIN SR) 150 MG 12 hr tablet Take 1 tablet by mouth twice daily 08/23/18   [name], MD  fluticasone (FLONASE) 50 MCG/ACT nasal spray Place 2 sprays into both nostrils daily. 02/24/17   Jule Ser, DO  glipiZIDE (GLUCOTROL) 10 MG tablet Take 1 tablet by mouth once daily 07/09/18   Aldine Contes, MD  Glucose Blood (BLOOD GLUCOSE TEST STRIPS) STRP 1 strip by In Vitro route daily before breakfast. 05/23/14   Emokpae, Ejiroghene E, MD  Insulin Pen Needle (BD PEN NEEDLE NANO U/F) 32G X 4 MM MISC 1 Units/day by Does not apply route daily. 02/26/15   Loleta Chance, MD  Lancets MISC 1 Units by Does not apply route daily. 05/23/14   Emokpae, Ejiroghene E, MD  levothyroxine (SYNTHROID) 150 MCG tablet TAKE 1 TABLET BY MOUTH ONCE DAILY BEFORE BREAKFAST 07/09/18   Aldine Contes, MD  loratadine (CLARITIN) 10 MG tablet Take 1 tablet (10 mg total) by mouth daily. 02/15/18   [name], MD  losartan (COZAAR) 100 MG tablet Take 1 tablet (100 mg total) by mouth daily. 09/02/18   [name], MD  metFORMIN (GLUCOPHAGE) 1000 MG tablet Take 1 tablet (1,000 mg total) by mouth 2 (two) times daily. 09/02/18   [name], MD  metoprolol succinate (TOPROL-XL) 100 MG 24 hr tablet TAKE 1 TABLET BY MOUTH ONCE DAILY WITH  OR  IMMEDIATELY  FOLLOWING  A  MEAL 08/23/18   [name], MD  nitroGLYCERIN (NITROSTAT) 0.4 MG SL tablet Place 1 tablet (0.4 mg total) under the tongue every 5 (five) minutes as needed for chest pain. 05/13/17   [name], PA-C  Omega-3 Fatty Acids (FISH OIL) 1000 MG CAPS Take 1  capsule by mouth every morning.     [provider]  omeprazole (PRILOSEC) 40 MG capsule TAKE 1 CAPSULE BY MOUTH ONCE DAILY 11/30/17   Aldine Contes, MD  rOPINIRole (REQUIP) 1 MG tablet Take 1 tablet (1 mg total) by mouth at bedtime. 09/02/18   [name], MD  sertraline (ZOLOFT) 100 MG tablet Take 1.5 tablets (150 mg total) by mouth daily. 09/02/18   [name], MD  simethicone (MYLICON) 80 MG chewable tablet Chew 1 tablet (80 mg total) by mouth every 6 (six) hours as needed for flatulence (Bloating). 08/17/18   Neva Seat, MD  Spacer/Aero-Holding Chambers (AEROCHAMBER PLUS) inhaler Use as instructed 02/03/15   Melynda Ripple, MD  triamcinolone cream (KENALOG) 0.1 % Apply 1 application topically 2 (two) times daily. 02/15/18   [name], MD    Family History Family History  Problem Relation Age of Onset   Diabetes Mother    Hypertension Mother     Social History Social History   Tobacco Use   Smoking status: Current Every Day Smoker    Packs/day: 0.40    Years: 40.00    Pack years: 16.00    Types: Cigarettes    Last attempt to quit: 05/19/2014    Years since quitting: 4.3   Smokeless tobacco: Never Used   Tobacco comment: 3 per day Trying to quit again-cutting back  Substance Use Topics   Alcohol use: No    Alcohol/week: 0.0 standard drinks   Drug use: Yes    Frequency: 7.0 times per week    Types: Marijuana    Comment: Marijuana Use     Allergies   Gabapentin, Nsaids, Opium, and Tramadol hcl   Review of Systems Review of Systems  Constitutional: Positive for fever.  All other systems reviewed and are negative.    Physical Exam Updated Vital Signs BP 128/86    Pulse (!) 105    Temp (!) 102.4 F (39.1 C) (Oral)    Resp (!) 22    SpO2 95%   Physical Exam Vitals signs and nursing note reviewed.    58 year old female, resting comfortably and in no acute distress. Vital signs are significant for fever, rapid heart  rate, rapid respiratory rate. Oxygen saturation is 95%, which is normal. Head is normocephalic and atraumatic. PERRLA, EOMI. Oropharynx is clear. Neck is nontender and supple without adenopathy or JVD. Back is nontender and there is no CVA tenderness. Lungs are clear without rales, wheezes, or rhonchi. Chest is nontender. Heart has regular rate and rhythm without murmur. Abdomen is soft, flat, nontender without masses or hepatosplenomegaly and peristalsis is normoactive. Extremities have no cyanosis or edema, full range of motion is present. Skin is warm and dry without rash. Neurologic: Mental status is normal, cranial nerves are intact, there are no motor or sensory deficits.  ED Treatments / Results  Labs (all labs ordered are listed, but only abnormal results are displayed) Labs Reviewed  CULTURE, BLOOD (ROUTINE X 2)  CULTURE, BLOOD (ROUTINE X 2)  COMPREHENSIVE METABOLIC PANEL  CBC  LACTIC ACID, PLASMA  LACTIC ACID, PLASMA  POC OCCULT BLOOD, ED  TYPE AND SCREEN    EKG None  Radiology No results found.  Procedures Procedures (including critical care time)  Medications Ordered in ED Medications - No data to display   Initial Impression / Assessment and Plan / ED Course  I have reviewed the triage vital signs and the nursing notes.  Pertinent labs & imaging results that were available during my care of the patient were reviewed by me and considered in my medical decision making (see chart for details).  Fever of uncertain cause, certainly concerning for possible COVID-19.  Septic work-up is initiated including blood cultures, chest x-ray, urinalysis.  Rectal bleeding.  Old records are reviewed, and apparently she did have colitis with some bleeding 9 years ago and biopsies were consistent with Crohn's disease.  Today, hemoglobin is 9.5 which is a drop from 10.2 on 09/02/2018.  Lactic acid level is normal.  WBC is elevated to 14.5.  Will send swab for COVID-19 testing.   Orthostatic vital  signs were done showing blood pressure dropping from 98/70 to 82/42, and heart rate increased from 100 to 115.  Chest x-ray shows no evidence of pneumonia.  Hemoglobin has dropped with hemoglobin today 9.5 and was 10.2 on 09/02/2018, and was 11.0 on 08/17/2018.  Lactic acid levels normal.  Electrolytes were significant for potassium 3.3 and she is given a dose of oral potassium.  Urinalysis is still pending.  She continues to have borderline hypotension and tachycardia.  Case is discussed with Dr. Tarri Abernethy of internal medicine teaching service who agrees to admit the patient.  Jamilet Ambroise was evaluated in Emergency Department on 09/12/2018 for the symptoms described in the history of present illness. She was evaluated in the context of the global COVID-19 pandemic, which necessitated consideration that the patient might be at risk for infection with the SARS-CoV-2 virus that causes COVID-19. Institutional protocols and algorithms that pertain to the evaluation of patients at risk for COVID-19 are in a state of rapid change based on information released by regulatory bodies including the CDC and federal and state organizations. These policies and algorithms were followed during the patient's care in the ED.   Final Clinical Impressions(s) / ED Diagnoses   Final diagnoses:  Gastrointestinal hemorrhage, unspecified gastrointestinal hemorrhage type  Fever in adult  Hypokalemia    ED Discharge Orders    None       Delora Fuel, MD 16/01/09 0131

## 2018-09-12 ENCOUNTER — Emergency Department (HOSPITAL_COMMUNITY): Payer: Self-pay

## 2018-09-12 ENCOUNTER — Other Ambulatory Visit: Payer: Self-pay

## 2018-09-12 ENCOUNTER — Inpatient Hospital Stay (HOSPITAL_COMMUNITY): Payer: Self-pay

## 2018-09-12 DIAGNOSIS — Z79890 Hormone replacement therapy (postmenopausal): Secondary | ICD-10-CM

## 2018-09-12 DIAGNOSIS — E876 Hypokalemia: Secondary | ICD-10-CM

## 2018-09-12 DIAGNOSIS — E039 Hypothyroidism, unspecified: Secondary | ICD-10-CM

## 2018-09-12 DIAGNOSIS — I10 Essential (primary) hypertension: Secondary | ICD-10-CM

## 2018-09-12 DIAGNOSIS — F329 Major depressive disorder, single episode, unspecified: Secondary | ICD-10-CM

## 2018-09-12 DIAGNOSIS — Z886 Allergy status to analgesic agent status: Secondary | ICD-10-CM

## 2018-09-12 DIAGNOSIS — R933 Abnormal findings on diagnostic imaging of other parts of digestive tract: Secondary | ICD-10-CM

## 2018-09-12 DIAGNOSIS — D500 Iron deficiency anemia secondary to blood loss (chronic): Secondary | ICD-10-CM

## 2018-09-12 DIAGNOSIS — Z79899 Other long term (current) drug therapy: Secondary | ICD-10-CM

## 2018-09-12 DIAGNOSIS — R152 Fecal urgency: Secondary | ICD-10-CM

## 2018-09-12 DIAGNOSIS — G2581 Restless legs syndrome: Secondary | ICD-10-CM

## 2018-09-12 DIAGNOSIS — I700 Atherosclerosis of aorta: Secondary | ICD-10-CM

## 2018-09-12 DIAGNOSIS — I2510 Atherosclerotic heart disease of native coronary artery without angina pectoris: Secondary | ICD-10-CM

## 2018-09-12 DIAGNOSIS — R1032 Left lower quadrant pain: Secondary | ICD-10-CM

## 2018-09-12 DIAGNOSIS — Z888 Allergy status to other drugs, medicaments and biological substances status: Secondary | ICD-10-CM

## 2018-09-12 DIAGNOSIS — K529 Noninfective gastroenteritis and colitis, unspecified: Secondary | ICD-10-CM

## 2018-09-12 DIAGNOSIS — K922 Gastrointestinal hemorrhage, unspecified: Secondary | ICD-10-CM

## 2018-09-12 DIAGNOSIS — Z720 Tobacco use: Secondary | ICD-10-CM

## 2018-09-12 DIAGNOSIS — J449 Chronic obstructive pulmonary disease, unspecified: Secondary | ICD-10-CM

## 2018-09-12 DIAGNOSIS — Z885 Allergy status to narcotic agent status: Secondary | ICD-10-CM

## 2018-09-12 DIAGNOSIS — E119 Type 2 diabetes mellitus without complications: Secondary | ICD-10-CM

## 2018-09-12 DIAGNOSIS — R509 Fever, unspecified: Secondary | ICD-10-CM

## 2018-09-12 DIAGNOSIS — Z7984 Long term (current) use of oral hypoglycemic drugs: Secondary | ICD-10-CM

## 2018-09-12 DIAGNOSIS — K625 Hemorrhage of anus and rectum: Secondary | ICD-10-CM | POA: Diagnosis present

## 2018-09-12 LAB — TYPE AND SCREEN
ABO/RH(D): O POS
Antibody Screen: NEGATIVE

## 2018-09-12 LAB — COMPREHENSIVE METABOLIC PANEL
ALT: 16 U/L (ref 0–44)
AST: 19 U/L (ref 15–41)
Albumin: 2.6 g/dL — ABNORMAL LOW (ref 3.5–5.0)
Alkaline Phosphatase: 105 U/L (ref 38–126)
Anion gap: 11 (ref 5–15)
BUN: 10 mg/dL (ref 6–20)
CO2: 23 mmol/L (ref 22–32)
Calcium: 8.6 mg/dL — ABNORMAL LOW (ref 8.9–10.3)
Chloride: 102 mmol/L (ref 98–111)
Creatinine, Ser: 0.92 mg/dL (ref 0.44–1.00)
GFR calc Af Amer: >60 mL/min (ref >60)
GFR calc non Af Amer: >60 mL/min (ref >60)
Glucose, Bld: 187 mg/dL — ABNORMAL HIGH (ref 70–99)
Potassium: 3.3 mmol/L — ABNORMAL LOW (ref 3.5–5.1)
Sodium: 136 mmol/L (ref 135–145)
Total Bilirubin: 0.4 mg/dL (ref 0.3–1.2)
Total Protein: 6.2 g/dL — ABNORMAL LOW (ref 6.5–8.1)

## 2018-09-12 LAB — URINALYSIS, ROUTINE W REFLEX MICROSCOPIC
Bilirubin Urine: NEGATIVE
Glucose, UA: NEGATIVE mg/dL
Hgb urine dipstick: NEGATIVE
Ketones, ur: NEGATIVE mg/dL
Leukocytes,Ua: NEGATIVE
Nitrite: NEGATIVE
Protein, ur: NEGATIVE mg/dL
Specific Gravity, Urine: 1.013 (ref 1.005–1.030)
pH: 5 (ref 5.0–8.0)

## 2018-09-12 LAB — VITAMIN B12: Vitamin B-12: 298 pg/mL (ref 180–914)

## 2018-09-12 LAB — GLUCOSE, CAPILLARY
Glucose-Capillary: 163 mg/dL — ABNORMAL HIGH (ref 70–99)
Glucose-Capillary: 130 mg/dL — ABNORMAL HIGH (ref 70–99)
Glucose-Capillary: 197 mg/dL — ABNORMAL HIGH (ref 70–99)
Glucose-Capillary: 101 mg/dL — ABNORMAL HIGH (ref 70–99)

## 2018-09-12 LAB — HIV ANTIBODY (ROUTINE TESTING W REFLEX): HIV Screen 4th Generation wRfx: NONREACTIVE

## 2018-09-12 LAB — OCCULT BLOOD X 1 CARD TO LAB, STOOL: Fecal Occult Bld: POSITIVE — AB

## 2018-09-12 LAB — LACTIC ACID, PLASMA
Lactic Acid, Venous: 1.9 mmol/L (ref 0.5–1.9)
Lactic Acid, Venous: 0.9 mmol/L (ref 0.5–1.9)

## 2018-09-12 LAB — C DIFFICILE QUICK SCREEN W PCR REFLEX
C Diff antigen: NEGATIVE
C Diff interpretation: NOT DETECTED
C Diff toxin: NEGATIVE

## 2018-09-12 LAB — ABO/RH: ABO/RH(D): O POS

## 2018-09-12 MED ORDER — ALBUTEROL SULFATE (2.5 MG/3ML) 0.083% IN NEBU: 2.5000 mg | INHALATION_SOLUTION | Freq: Four times a day (QID) | RESPIRATORY_TRACT | Status: DC | PRN

## 2018-09-12 MED ORDER — SIMETHICONE 80 MG PO CHEW
80.0000 mg | CHEWABLE_TABLET | Freq: Four times a day (QID) | ORAL | Status: DC | PRN
  Administered 2018-09-12 – 2018-09-13 (×2): 80 mg via ORAL
  Filled 2018-09-12 (×2): qty 1

## 2018-09-12 MED ORDER — ONDANSETRON HCL 4 MG/2ML IJ SOLN
4.0000 mg | Freq: Four times a day (QID) | INTRAMUSCULAR | Status: DC | PRN
  Administered 2018-09-13: 4 mg via INTRAVENOUS
  Filled 2018-09-12: qty 2

## 2018-09-12 MED ORDER — ENOXAPARIN SODIUM 40 MG/0.4ML ~~LOC~~ SOLN
40.0000 mg | SUBCUTANEOUS | Status: DC
  Administered 2018-09-12 – 2018-09-15 (×4): 40 mg via SUBCUTANEOUS
  Filled 2018-09-12 (×4): qty 0.4

## 2018-09-12 MED ORDER — ACETAMINOPHEN 325 MG PO TABS
650.0000 mg | ORAL_TABLET | Freq: Once | ORAL | Status: AC
  Administered 2018-09-12: 650 mg via ORAL
  Filled 2018-09-12: qty 2

## 2018-09-12 MED ORDER — SODIUM CHLORIDE 0.9 % IV SOLN
510.0000 mg | Freq: Once | INTRAVENOUS | Status: AC
  Administered 2018-09-12: 510 mg via INTRAVENOUS
  Filled 2018-09-12: qty 17

## 2018-09-12 MED ORDER — LORATADINE 10 MG PO TABS
10.0000 mg | ORAL_TABLET | Freq: Every day | ORAL | Status: DC
  Administered 2018-09-12 – 2018-09-15 (×4): 10 mg via ORAL
  Filled 2018-09-12 (×4): qty 1

## 2018-09-12 MED ORDER — POTASSIUM CHLORIDE CRYS ER 20 MEQ PO TBCR
40.0000 meq | EXTENDED_RELEASE_TABLET | Freq: Once | ORAL | Status: AC
  Administered 2018-09-12: 40 meq via ORAL
  Filled 2018-09-12: qty 2

## 2018-09-12 MED ORDER — DIPHENHYDRAMINE HCL 50 MG/ML IJ SOLN
25.0000 mg | Freq: Once | INTRAMUSCULAR | Status: AC
  Administered 2018-09-12: 25 mg via INTRAVENOUS
  Filled 2018-09-12: qty 1

## 2018-09-12 MED ORDER — LEVOTHYROXINE SODIUM 75 MCG PO TABS
150.0000 ug | ORAL_TABLET | Freq: Every day | ORAL | Status: DC
  Administered 2018-09-12 – 2018-09-15 (×4): 150 ug via ORAL
  Filled 2018-09-12 (×4): qty 2

## 2018-09-12 MED ORDER — ACETAMINOPHEN 650 MG RE SUPP: 650.0000 mg | Freq: Four times a day (QID) | RECTAL | Status: DC | PRN

## 2018-09-12 MED ORDER — SODIUM CHLORIDE 0.9 % IV BOLUS: 1000.0000 mL | Freq: Once | INTRAVENOUS | Status: DC

## 2018-09-12 MED ORDER — DICYCLOMINE HCL 10 MG PO CAPS
10.0000 mg | ORAL_CAPSULE | Freq: Three times a day (TID) | ORAL | Status: DC
  Administered 2018-09-12 – 2018-09-15 (×9): 10 mg via ORAL
  Filled 2018-09-12 (×9): qty 1

## 2018-09-12 MED ORDER — FLUTICASONE PROPIONATE 50 MCG/ACT NA SUSP
2.0000 | Freq: Every day | NASAL | Status: DC
  Administered 2018-09-13 – 2018-09-15 (×2): 2 via NASAL
  Filled 2018-09-12: qty 16

## 2018-09-12 MED ORDER — METOCLOPRAMIDE HCL 5 MG/ML IJ SOLN
10.0000 mg | Freq: Once | INTRAMUSCULAR | Status: AC
  Administered 2018-09-12: 10 mg via INTRAVENOUS
  Filled 2018-09-12: qty 2

## 2018-09-12 MED ORDER — ROPINIROLE HCL 1 MG PO TABS
1.0000 mg | ORAL_TABLET | Freq: Every day | ORAL | Status: DC
  Administered 2018-09-12 – 2018-09-14 (×3): 1 mg via ORAL
  Filled 2018-09-12 (×3): qty 1

## 2018-09-12 MED ORDER — ONDANSETRON HCL 4 MG PO TABS: 4.0000 mg | ORAL_TABLET | Freq: Four times a day (QID) | ORAL | Status: DC | PRN

## 2018-09-12 MED ORDER — LACTATED RINGERS IV SOLN
INTRAVENOUS | Status: DC
  Administered 2018-09-12 – 2018-09-13 (×2): via INTRAVENOUS

## 2018-09-12 MED ORDER — SERTRALINE HCL 50 MG PO TABS
150.0000 mg | ORAL_TABLET | Freq: Every day | ORAL | Status: DC
  Administered 2018-09-12 – 2018-09-15 (×4): 150 mg via ORAL
  Filled 2018-09-12 (×4): qty 1

## 2018-09-12 MED ORDER — INSULIN ASPART 100 UNIT/ML ~~LOC~~ SOLN
0.0000 [IU] | Freq: Three times a day (TID) | SUBCUTANEOUS | Status: DC
  Administered 2018-09-12: 2 [IU] via SUBCUTANEOUS
  Administered 2018-09-12 – 2018-09-13 (×3): 3 [IU] via SUBCUTANEOUS
  Administered 2018-09-13 – 2018-09-14 (×4): 2 [IU] via SUBCUTANEOUS
  Administered 2018-09-14 – 2018-09-15 (×2): 3 [IU] via SUBCUTANEOUS

## 2018-09-12 MED ORDER — SODIUM CHLORIDE 0.9 % IV BOLUS
1000.0000 mL | Freq: Once | INTRAVENOUS | Status: AC
  Administered 2018-09-12: 1000 mL via INTRAVENOUS

## 2018-09-12 MED ORDER — ATORVASTATIN CALCIUM 80 MG PO TABS
80.0000 mg | ORAL_TABLET | Freq: Every day | ORAL | Status: DC
  Administered 2018-09-12 – 2018-09-15 (×4): 80 mg via ORAL
  Filled 2018-09-12 (×4): qty 1

## 2018-09-12 MED ORDER — CIPROFLOXACIN IN D5W 400 MG/200ML IV SOLN
400.0000 mg | Freq: Two times a day (BID) | INTRAVENOUS | Status: DC
  Administered 2018-09-12 – 2018-09-14 (×4): 400 mg via INTRAVENOUS
  Filled 2018-09-12 (×5): qty 200

## 2018-09-12 MED ORDER — METRONIDAZOLE IN NACL 5-0.79 MG/ML-% IV SOLN
500.0000 mg | Freq: Four times a day (QID) | INTRAVENOUS | Status: DC
  Administered 2018-09-12 – 2018-09-14 (×7): 500 mg via INTRAVENOUS
  Filled 2018-09-12 (×8): qty 100

## 2018-09-12 MED ORDER — ACETAMINOPHEN 325 MG PO TABS
650.0000 mg | ORAL_TABLET | Freq: Four times a day (QID) | ORAL | Status: DC | PRN
  Administered 2018-09-12: 650 mg via ORAL
  Filled 2018-09-12: qty 2

## 2018-09-12 MED ORDER — IOHEXOL 300 MG/ML  SOLN
125.0000 mL | Freq: Once | INTRAMUSCULAR | Status: AC | PRN
  Administered 2018-09-12: 125 mL via INTRAVENOUS

## 2018-09-12 NOTE — ED Notes (Signed)
Patient transported to CT 

## 2018-09-12 NOTE — Plan of Care (Signed)

## 2018-09-12 NOTE — Consult Note (Addendum)
Consultation  Referring Provider: Medicine service/Butcher MD Primary Care Physician:  [name], MD Primary Gastroenterologist:  Dr. Juanita Craver  Reason for Consultation: Bloody diarrhea, and abnormal CT scan  HPI: Kendra Adams is a 58 y.o. female, known to [name] who we are asked to see this morning after she was admitted to the hospital last night with complaints of 4 to 5-day history of diarrhea, with bloody stools up to 8-10 times per day, associated nausea vomiting and fever to 103 at home. Patient had recently been seen in the medicine clinic with complaints of rectal bleeding ,and referred to [name] and is scheduled for colonoscopy actually tomorrow as an outpatient.   Patient had colonoscopy with [name] in 2011 which showed scattered diverticulosis, there was patchy erythema in the left colon, she had 2 small right colon sessile polyps which were removed one small lipoma noted in the right colon.  Biopsies showed patchy active colitis the right and left colon and the sessile polyps were noted to be lymphoid aggregates. Patient says that she she had not ever been diagnosed with colitis and had not required any medications.  She had not had any problems for many years. She says her current symptoms started a couple of months ago with looser stools containing blood.  She had been on a medication to help her stop smoking and thought perhaps the medication was causing problems.  She decreased the dose of that and then eventually stopped it but without any change in her GI symptoms.  She says the diarrhea and rectal bleeding has gradually gotten worse over the past several weeks to the point where she was having at least 8-10 bowel movements every day all of them loose and containing red blood and occasional clots.  She complains of lower abdominal cramping especially prior to bowel movements.  No severe pain.  Earlier this week she had noted low-grade temps at home to 100.2,  then 100.4 and yesterday had a temp of 103 at home which prompted her to come to the emergency room.  She has been nauseated and having occasional episodes of vomiting this past week. No known infectious exposures, no recent antibiotics.  Work-up in the emergency room with CT scan of the abdomen and pelvis shows right lower lobe atelectasis, patient is status post cholecystectomy.  There is diffuse colonic wall thickening from the a sending colon to the rectum with associated pericolonic edema.  There is a 14 mm low-density area within or immediately adjacent to the sigmoid colon, no perforation and small intramural abscess or fluid within a diverticulum Also noted to have prominent mesenteric nodes likely reactive.  GI path panel and stool for C. difficile have been ordered and are pending On admit WBC 14.5, hemoglobin 9.5, lactate 1.9. LFTs within normal limits Potassium 3.3 COVID-19 pending  .  Other medical problems include obesity, adult onset diabetes mellitus, coronary artery disease, hypertension and hypothyroidism   Past Medical History:  Diagnosis Date  . Allergic rhinitis   . Asthma   . CAD (coronary artery disease)    a. 12/13/10: mRCA 99%, EF 65-70%;  s/p BMS to mRCA  . COPD (chronic obstructive pulmonary disease) ([place])   . Depression   . DM2 (diabetes mellitus, type 2) ([place])   . GERD (gastroesophageal reflux disease)   . HTN (hypertension)   . Hyperlipidemia   . Hyperlipidemia   . Hypothyroidism   . Seasonal allergies   . Tobacco abuse  Past Surgical History:  Procedure Laterality Date  . CARDIAC CATHETERIZATION N/A 08/14/2015   Procedure: Left Heart Cath and Coronary Angiography;  Surgeon: Peter M Martinique, MD;  Location: [facility];  Service: Cardiovascular;  Laterality: N/A;  . CARPAL TUNNEL RELEASE     w/ bone spurs  . CHOLECYSTECTOMY N/A 08/15/2015   Procedure: LAPAROSCOPIC CHOLECYSTECTOMY WITH ATTEMPTED INTRAOPERATIVE CHOLANGIOGRAM;  Surgeon: Erroll Luna, MD;  Location: [facility];  Service: General;  Laterality: N/A;  . CORONARY ANGIOPLASTY WITH STENT PLACEMENT    . DILATION AND CURETTAGE OF UTERUS      Prior to Admission medications   Medication Sig Start Date End Date Taking? Authorizing Provider  albuterol (PROVENTIL HFA;VENTOLIN HFA) 108 (90 Base) MCG/ACT inhaler Inhale 1-2 puffs into the lungs every 6 (six) hours as needed for wheezing or shortness of breath. 01/01/18  Yes [name], MD  aspirin 81 MG chewable tablet Chew 81 mg by mouth every morning.    Yes [provider]  atorvastatin (LIPITOR) 80 MG tablet Take 1 tablet (80 mg total) by mouth daily. 02/15/18  Yes [name], MD  betamethasone dipropionate (DIPROLENE) 0.05 % cream Apply topically 2 (two) times daily. Patient taking differently: Apply 1 application topically 2 (two) times daily as needed (skin care).  02/15/18  Yes [name], MD  Blood Glucose Monitoring Suppl DEVI 1 Device by Does not apply route daily before breakfast. 05/23/14  Yes Emokpae, Ejiroghene E, MD  fluticasone (FLONASE) 50 MCG/ACT nasal spray Place 2 sprays into both nostrils daily. Patient taking differently: Place 2 sprays into both nostrils daily as needed for allergies.  02/24/17  Yes Jule Ser, DO  glipiZIDE (GLUCOTROL) 10 MG tablet Take 1 tablet by mouth once daily 07/09/18  Yes Aldine Contes, MD  Glucose Blood (BLOOD GLUCOSE TEST STRIPS) STRP 1 strip by In Vitro route daily before breakfast. 05/23/14  Yes Emokpae, Ejiroghene E, MD  Insulin Pen Needle (BD PEN NEEDLE NANO U/F) 32G X 4 MM MISC 1 Units/day by Does not apply route daily. 02/26/15  Yes Loleta Chance, MD  Lancets MISC 1 Units by Does not apply route daily. 05/23/14  Yes Emokpae, Ejiroghene E, MD  levothyroxine (SYNTHROID) 150 MCG tablet TAKE 1 TABLET BY MOUTH ONCE DAILY BEFORE BREAKFAST Patient taking differently: Take 150 mcg by mouth daily before breakfast.  07/09/18  Yes Aldine Contes, MD  loratadine  (CLARITIN) 10 MG tablet Take 1 tablet (10 mg total) by mouth daily. 02/15/18  Yes [name], MD  losartan (COZAAR) 100 MG tablet Take 1 tablet (100 mg total) by mouth daily. 09/02/18  Yes [name], MD  metFORMIN (GLUCOPHAGE) 1000 MG tablet Take 1 tablet (1,000 mg total) by mouth 2 (two) times daily. 09/02/18  Yes [name], MD  metoprolol succinate (TOPROL-XL) 100 MG 24 hr tablet TAKE 1 TABLET BY MOUTH ONCE DAILY WITH  OR  IMMEDIATELY  FOLLOWING  A  MEAL Patient taking differently: Take 100 mg by mouth daily.  08/23/18  Yes [name], MD  nitroGLYCERIN (NITROSTAT) 0.4 MG SL tablet Place 1 tablet (0.4 mg total) under the tongue every 5 (five) minutes as needed for chest pain. 05/13/17  Yes [name], PA-C  Omega-3 Fatty Acids (FISH OIL) 1000 MG CAPS Take 1 capsule by mouth every morning.    Yes [provider]  omeprazole (PRILOSEC) 40 MG capsule TAKE 1 CAPSULE BY MOUTH ONCE DAILY Patient taking differently: Take 40 mg by mouth daily.  11/30/17  Yes Dareen Piano,  [place], MD  rOPINIRole (REQUIP) 1 MG tablet Take 1 tablet (1 mg total) by mouth at bedtime. 09/02/18  Yes [name], MD  sertraline (ZOLOFT) 100 MG tablet Take 1.5 tablets (150 mg total) by mouth daily. 09/02/18  Yes [name], MD  simethicone (MYLICON) 80 MG chewable tablet Chew 1 tablet (80 mg total) by mouth every 6 (six) hours as needed for flatulence (Bloating). 08/17/18  Yes Neva Seat, MD  Spacer/Aero-Holding Chambers (AEROCHAMBER PLUS) inhaler Use as instructed 02/03/15  Yes Melynda Ripple, MD  triamcinolone cream (KENALOG) 0.1 % Apply 1 application topically 2 (two) times daily. Patient taking differently: Apply 1 application topically 2 (two) times daily as needed (skin care).  02/15/18  Yes [name], MD  buPROPion [facility] SR) 150 MG 12 hr tablet Take 1 tablet by mouth twice daily Patient not taking: Reported on 09/12/2018 08/23/18   [name], MD     Current Facility-Administered Medications  Medication Dose Route Frequency Provider Last Rate Last Dose  . acetaminophen (TYLENOL) tablet 650 mg  650 mg Oral Q6H PRN Ina Homes, MD       Or  . acetaminophen (TYLENOL) suppository 650 mg  650 mg Rectal Q6H PRN Helberg, Justin, MD      . albuterol (PROVENTIL) (2.5 MG/3ML) 0.083% nebulizer solution 2.5 mg  2.5 mg Inhalation Q6H PRN Ina Homes, MD      . atorvastatin (LIPITOR) tablet 80 mg  80 mg Oral Daily Ina Homes, MD   80 mg at 09/12/18 0920  . enoxaparin (LOVENOX) injection 40 mg  40 mg Subcutaneous Q24H Helberg, Justin, MD   40 mg at 09/12/18 1030  . fluticasone (FLONASE) 50 MCG/ACT nasal spray 2 spray  2 spray Each Nare Daily Helberg, Justin, MD      . insulin aspart (novoLOG) injection 0-15 Units  0-15 Units Subcutaneous TID WC Ina Homes, MD   3 Units at 09/12/18 0920  . lactated ringers infusion   Intravenous Continuous Welford Roche, MD 100 mL/hr at 09/12/18 1030    . levothyroxine (SYNTHROID) tablet 150 mcg  150 mcg Oral Q0600 Ina Homes, MD   150 mcg at 09/12/18 0617  . loratadine (CLARITIN) tablet 10 mg  10 mg Oral Daily Ina Homes, MD   10 mg at 09/12/18 0920  . ondansetron (ZOFRAN) tablet 4 mg  4 mg Oral Q6H PRN Ina Homes, MD       Or  . ondansetron (ZOFRAN) injection 4 mg  4 mg Intravenous Q6H PRN Helberg, Justin, MD      . rOPINIRole (REQUIP) tablet 1 mg  1 mg Oral QHS Helberg, Justin, MD      . sertraline (ZOLOFT) tablet 150 mg  150 mg Oral Daily Ina Homes, MD   150 mg at 09/12/18 0919  . simethicone (MYLICON) chewable tablet 80 mg  80 mg Oral Q6H PRN Ina Homes, MD        Allergies as of 09/11/2018 - Review Complete 09/11/2018  Allergen Reaction Noted  . Gabapentin  07/25/2014  . Nsaids  09/25/2011  . Opium Nausea And Vomiting 05/13/2017  . Tramadol hcl  02/22/2010    Family History  Problem Relation Age of Onset  . Diabetes Mother   . Hypertension Mother      Social History   Socioeconomic History  . Marital status: Married    Spouse name: Not on file  . Number of children: 0  . Years of education: 10th grade  . Highest education level: Not on file  Occupational History  . Occupation: Conservation officer, [employer]    Comment: self-employed  . 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 Financial resource strain: Not on file  . Food insecurity    Worry: Not on file    Inability: Not on file  . Transportation needs    Medical: Not on file    Non-medical: Not on file  Tobacco Use  . Smoking status: Current Every Day Smoker    Packs/day: 0.40    Years: 40.00    Pack years: 16.00    Types: Cigarettes    Last attempt to quit: 05/19/2014    Years since quitting: 4.3  . Smokeless tobacco: Never Used  . Tobacco comment: 3 per day Trying to quit again-cutting back  Substance and Sexual Activity  . Alcohol use: No    Alcohol/week: 0.0 standard drinks  . Drug use: Yes    Frequency: 7.0 times per week    Types: Marijuana    Comment: Marijuana Use  . Sexual activity: Yes  Lifestyle  . Physical activity    Days per week: 0 days    Minutes per session: 0 min  . Stress: Not at all  Relationships  . Social connections    Talks on phone: More than three times a week    Gets together: Once a week    Attends religious service: Never    Active member of club or organization: No    Attends meetings of clubs or organizations: Never    Relationship status: Married  . Intimate partner violence    Fear of current or ex partner: No    Emotionally abused: No    Physically abused: No    Forced sexual activity: No  Other Topics Concern  . Not on file  Social History Narrative   Father died of suicide (gunshot) in 67. 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       Patient lives with her husband, her husband's niece Tammy who is mentally retarded  and her best friend and the friend's daughter and 6 dogs                Review of Systems: Pertinent positive and negative review of systems were noted in the above HPI section.  All other review of systems was otherwise negative.  Physical Exam: Vital signs in last 24 hours: Temp:  [99.1 F (37.3 C)-102.4 F (39.1 C)] 99.1 F (37.3 C) (07/05 0615) Pulse Rate:  [76-116] 76 (07/05 0615) Resp:  [16-28] 18 (07/05 0615) BP: (99-128)/(60-86) 118/62 (07/05 0615) SpO2:  [93 %-98 %] 98 % (07/05 0615) Weight:  [086 kg-116.6 kg] 113 kg (07/05 0619)   General:   Alert,  Well-developed, well-nourished, obese, pleasant and cooperative in NAD Head:  Normocephalic and atraumatic. Eyes:  Sclera clear, no icterus.   Conjunctiva pink. Ears:  Normal auditory acuity. Nose:  No deformity, discharge,  or lesions. Mouth:  No deformity or lesions.   Neck:  Supple; no masses or thyromegaly. Lungs:  Clear throughout to auscultation.   No wheezes, crackles, or rhonchi.  Heart:  Regular rate and rhythm; no murmurs, clicks, rubs,  or gallops. Abdomen:  Soft, mildly tender, across the lower abdomen, and left lower quadrant, no guarding or rebound BS active,nonpalp mass or hsm.   Rectal:  Deferred  Msk:  Symmetrical without  gross deformities. . Pulses:  Normal pulses noted. Extremities:  Without clubbing or edema. Neurologic:  Alert and  oriented x4;  grossly normal neurologically. Skin:  Intact without significant lesions or rashes.. Psych:  Alert and cooperative. Normal mood and affect.  Intake/Output from previous day: No intake/output data recorded. Intake/Output this shift: No intake/output data recorded.  Lab Results: Recent Labs    09/11/18 2328  WBC 14.5*  HGB 9.5*  HCT 29.4*  PLT 351   BMET Recent Labs    09/11/18 2328  NA 136  K 3.3*  CL 102  CO2 23  GLUCOSE 187*  BUN 10  CREATININE 0.92  CALCIUM 8.6*   LFT Recent Labs    09/11/18 2328  PROT 6.2*  ALBUMIN 2.6*  AST  19  ALT 16  ALKPHOS 105  BILITOT 0.4   PT/INR No results for input(s): LABPROT, INR in the last 72 hours. Hepatitis Panel No results for input(s): HEPBSAG, HCVAB, HEPAIGM, HEPBIGM in the last 72 hours.   IMPRESSION:  #31 58 year old white female with 63-monthhistory of change in bowel habits with progressively more severe diarrhea and hematochezia.  Patient developed fever last week and temp to 103 yesterday prompting ER visit.  CT scan shows a diffuse colitis, with question of 14 mm intramural abscess adjacent to the sigmoid colon fluid within a diverticulum.  Suspect she has inflammatory bowel disease other diffuse ulcerative colitis or Crohn's colitis.  Fever can be seen with severe inflammatory bowel disease but will need to rule out superimposed infectious etiology. She may also have small intramural abscess at the sigmoid colon   #2 anemia-secondary to subacute/acute GI blood loss #3 hypoalbuminemia-secondary to acute illness x2 months #4 adult onset diabetes mellitus #5 morbid obesity #6 COPD-no oxygen use #7 coronary artery disease  PLAN: Continue full liquid diet Await GI path panel and C. Difficile Add Bentyl 10 mg 3 times daily AC Add  IV Cipro and IV Flagyl Patient will need colonoscopy with Dr. Mann/Dr. HBenson Norwaythis admission..  Their team will see in a.m. and decide on timing.    Amy EsterwoodPA-C  09/12/2018, 10:56 AM  I have reviewed the entire case in detail with the above APP and discussed the plan in detail.  Therefore, I agree with the diagnoses recorded above. In addition,  I have personally interviewed and examined the patient and have personally reviewed any abdominal/pelvic CT scan images.  The area in question in the sigmoid is small and appears to be either intraluminal or intramural.  It does not look typical for an abscess.  Even accounting for the lack of oral contrast and therefore under distention of much of the colon, there does appear to be wall  thickening, more impressive in the left colon than right.  My additional thoughts are as follows:  Suspect inflammatory bowel disease.  I agree that the fever warrants rule out of superimposed infection.  C. difficile is negative, COVID test still pending, GI pathogen panel to rule out E. coli, Salmonella etc. still pending.  She is tender to deep palpation left lower quadrant.  We will cover with ciprofloxacin and metronidazole and hold off on scheduling colonoscopy until GI pathogen panel is resulted.  She will need a colonoscopy, but the timing is just to be determined based on possible acute infection.  Drs. MCollene Maresand HBenson Norwaywill follow this patient tomorrow, and will receive signout from me about her tonight.   HNelida MeuseIII Office:[phone]

## 2018-09-12 NOTE — ED Notes (Signed)
ED TO INPATIENT HANDOFF REPORT  ED Nurse Name and Phone #: Caprice Kluver [phone]  S Name/Age/Gender Kendra Adams 58 y.o. female Room/Bed: 026C/026C  Code Status   Code Status: Full Code  Home/SNF/Other Home Patient oriented to: self, place, time and situation Is this baseline? Yes   Triage Complete: Triage complete  Chief Complaint fever  Triage Note Pt is scheduled for colonoscopy on Monday. Has had steady fever; was taking aleve but was told not to take tylenol or aleve. Pt has been having rectal bleeding as well and has been told her blood counts are a little low. Reports dizziness when sitting    Allergies Allergies  Allergen Reactions  . Gabapentin     Skin rash, lip swelling  . Nsaids     IBU - Rectal bleeds; states ASA & Tylenol OK  . Opium Nausea And Vomiting  . Tramadol Hcl     "Just doesn't do anything for me"    Level of Care/Admitting Diagnosis ED Disposition    ED Disposition Condition [place]: [facility] [[id number]]  Level of Care: Med-Surg [16]  Covid Evaluation: Person Under Investigation (PUI)  Diagnosis: BRBPR (bright red blood per rectum) [732202]  Admitting Physician: Aline Brochure  Attending Physician: Larey Dresser A [phone]  Estimated length of stay: 3 - 4 days  Certification:: I certify this patient will need inpatient services for at least 2 midnights  PT Class (Do Not Modify): Inpatient [101]  PT Acc Code (Do Not Modify): Private [1]       B Medical/Surgery History Past Medical History:  Diagnosis Date  . Allergic rhinitis   . Asthma   . CAD (coronary artery disease)    a. 12/13/10: mRCA 99%, EF 65-70%;  s/p BMS to mRCA  . COPD (chronic obstructive pulmonary disease) ([place])   . Depression   . DM2 (diabetes mellitus, type 2) ([place])   . GERD (gastroesophageal reflux disease)   . HTN (hypertension)   . Hyperlipidemia   . Hyperlipidemia   . Hypothyroidism   . Seasonal  allergies   . Tobacco abuse    Past Surgical History:  Procedure Laterality Date  . CARDIAC CATHETERIZATION N/A 08/14/2015   Procedure: Left Heart Cath and Coronary Angiography;  Surgeon: Peter M Martinique, MD;  Location: [facility];  Service: Cardiovascular;  Laterality: N/A;  . CARPAL TUNNEL RELEASE     w/ bone spurs  . CHOLECYSTECTOMY N/A 08/15/2015   Procedure: LAPAROSCOPIC CHOLECYSTECTOMY WITH ATTEMPTED INTRAOPERATIVE CHOLANGIOGRAM;  Surgeon: Erroll [place], MD;  Location: [facility];  Service: General;  Laterality: N/A;  . CORONARY ANGIOPLASTY WITH STENT PLACEMENT    . DILATION AND CURETTAGE OF UTERUS       A IV Location/Drains/Wounds Patient Lines/Drains/Airways Status   Active Line/Drains/Airways    Name:   Placement date:   Placement time:   Site:   Days:   Peripheral IV 09/12/18 Left Antecubital   09/12/18    0115    Antecubital   less than 1   Incision (Closed) 08/15/15 Abdomen Other (Comment)   08/15/15    0915     1124   Incision - 4 Ports Abdomen 1: Umbilicus 2: Mid;Upper 3: Right;Medial 4: Right;Lateral   08/15/15    -     1124          Intake/Output Last 24 hours No intake or output data in the 24 hours ending 09/12/18 0623  Labs/Imaging Results for  orders placed or performed during the hospital encounter of 09/11/18 (from the past 48 hour(s))  Type and screen [place]     Status: None   Collection Time: 09/11/18 11:25 PM  Result Value Ref Range   ABO/RH(D) O POS    Antibody Screen NEG    Sample Expiration      09/14/2018,2359 Performed at [facility], [place] [address] [place], [place] [address]   ABO/Rh     Status: None (Preliminary result)   Collection Time: 09/11/18 11:25 PM  Result Value Ref Range   ABO/RH(D)      O POS Performed at [facility] [address], [place], [place] [address]   Comprehensive metabolic panel     Status: Abnormal   Collection Time: 09/11/18 11:28 PM  Result Value Ref Range   Sodium 136 135  - 145 mmol/L   Potassium 3.3 (L) 3.5 - 5.1 mmol/L   Chloride 102 98 - 111 mmol/L   CO2 23 22 - 32 mmol/L   Glucose, Bld 187 (H) 70 - 99 mg/dL   BUN 10 6 - 20 mg/dL   Creatinine, Ser 0.92 0.44 - 1.00 mg/dL   Calcium 8.6 (L) 8.9 - 10.3 mg/dL   Total Protein 6.2 (L) 6.5 - 8.1 g/dL   Albumin 2.6 (L) 3.5 - 5.0 g/dL   AST 19 15 - 41 U/L   ALT 16 0 - 44 U/L   Alkaline Phosphatase 105 38 - 126 U/L   Total Bilirubin 0.4 0.3 - 1.2 mg/dL   GFR calc non Af Amer >60 >60 mL/min   GFR calc Af Amer >60 >60 mL/min   Anion gap 11 5 - 15    Comment: Performed at [facility], [place] [address], [place], [place] [address]  CBC     Status: Abnormal   Collection Time: 09/11/18 11:28 PM  Result Value Ref Range   WBC 14.5 (H) 4.0 - 10.5 K/uL   RBC 3.31 (L) 3.87 - 5.11 MIL/uL   Hemoglobin 9.5 (L) 12.0 - 15.0 g/dL   HCT 29.4 (L) 36.0 - 46.0 %   MCV 88.8 80.0 - 100.0 fL   MCH 28.7 26.0 - 34.0 pg   MCHC 32.3 30.0 - 36.0 g/dL   RDW 13.5 11.5 - 15.5 %   Platelets 351 150 - 400 K/uL   nRBC 0.2 0.0 - 0.2 %    Comment: Performed at [facility], [place] [address], [place], [place] [address]  Lactic acid, plasma     Status: None   Collection Time: 09/11/18 11:29 PM  Result Value Ref Range   Lactic Acid, Venous 1.9 0.5 - 1.9 mmol/L    Comment: Performed at [facility] [address], [place],  [address]   Dg Chest Port 1 View  Result Date: 09/12/2018 CLINICAL DATA:  Fever EXAM: PORTABLE CHEST 1 VIEW COMPARISON:  12/25/2017 FINDINGS: The heart size and mediastinal contours are within normal limits. Both lungs are clear. Degenerative changes of both shoulders. Aortic atherosclerosis. IMPRESSION: No active disease. Electronically Signed   By: Donavan Foil M.D.   On: 09/12/2018 00:55    Pending Labs Unresulted Labs (From admission, onward)    Start     Ordered   09/12/18 0226  HIV antibody (Routine Testing)  Once,   STAT     09/12/18 0227   09/12/18 0006  Novel Coronavirus,NAA,(SEND-OUT  TO REF LAB - TAT 24-48 hrs); [place]  Order  (COVID Labs)  Once,   STAT    Question:  Pre-procedural testing  Answer:  Yes   09/12/18 0005   09/12/18 0002  Urinalysis, Routine w reflex microscopic  ONCE - STAT,   STAT     09/12/18 0002   09/11/18 2330  Culture, blood (Routine x 2)  BLOOD CULTURE X 2,   STAT     09/11/18 2330   09/11/18 2316  Lactic acid, plasma  Now then every 2 hours,   STAT     09/11/18 2316          Vitals/Pain Today's Vitals   09/12/18 0015 09/12/18 0030 09/12/18 0115 09/12/18 0123  BP: 100/60 99/61 112/66 111/61  Pulse: 100 94 96 93  Resp:  19 16 (!) 28  Temp:      TempSrc:      SpO2: 95% 93% 94% 95%  Weight:      PainSc:        Isolation Precautions No active isolations  Medications Medications  sodium chloride 0.9 % bolus 1,000 mL (has no administration in time range)  rOPINIRole (REQUIP) tablet 1 mg (has no administration in time range)  atorvastatin (LIPITOR) tablet 80 mg (has no administration in time range)  sertraline (ZOLOFT) tablet 150 mg (has no administration in time range)  levothyroxine (SYNTHROID) tablet 150 mcg (has no administration in time range)  simethicone (MYLICON) chewable tablet 80 mg (has no administration in time range)  albuterol (VENTOLIN HFA) 108 (90 Base) MCG/ACT inhaler 1-2 puff (has no administration in time range)  fluticasone (FLONASE) 50 MCG/ACT nasal spray 2 spray (has no administration in time range)  loratadine (CLARITIN) tablet 10 mg (has no administration in time range)  insulin aspart (novoLOG) injection 0-15 Units (has no administration in time range)  enoxaparin (LOVENOX) injection 40 mg (has no administration in time range)  acetaminophen (TYLENOL) tablet 650 mg (has no administration in time range)    Or  acetaminophen (TYLENOL) suppository 650 mg (has no administration in time range)  ondansetron (ZOFRAN) tablet 4 mg (has no administration in time range)    Or  ondansetron (ZOFRAN) injection 4 mg (has no  administration in time range)  sodium chloride 0.9 % bolus 1,000 mL (1,000 mLs Intravenous New Bag/Given 09/12/18 0121)  acetaminophen (TYLENOL) tablet 650 mg (650 mg Oral Given 09/12/18 0100)  metoCLOPramide (REGLAN) injection 10 mg (10 mg Intravenous Given 09/12/18 0118)  diphenhydrAMINE (BENADRYL) injection 25 mg (25 mg Intravenous Given 09/12/18 0116)  potassium chloride SA (K-DUR) CR tablet 40 mEq (40 mEq Oral Given 09/12/18 0107)    Mobility walks Low fall risk   Focused Assessments NA   R Recommendations: See Admitting Provider Note  Report given to:   Additional Notes:

## 2018-09-12 NOTE — Progress Notes (Signed)
Spoke with patient's wife in person in the lobby to provide an update.

## 2018-09-12 NOTE — Progress Notes (Signed)
Subjective: Ms. Gault reports continuing to feel frequent fecal urgency with continued bloody diarrhea and associated lower abdominal pain. She reports a 2 month history of bloody diarrhea and a 6 week history of abdominal pain. In the last week she's had some low grade fevers. 100.1 to 100.4 but her temperature of 103.1 last evening prompted her to come in. She says she was scheduled for a colonoscopy tomorrow and wanted to get treated for her fever so it didn't delay work up of her sx. In addition to fecal urgency she has had a few episodes of incontinence. Pt endorses decreased appetite as eating makes her sx worse and when she doesn't eat she feels better and has less diarrhea. She denies weight loss, however. She endorses some mouth ulcers for the last 10 years since getting dentures. She reports joint pain but mostly secondary for physical injuries. She denies any skin or eye changes or problems. She had a colonoscopy in 2011, as regular cancer screening and remembers being told she had some polyps that were removed. She denies ever having sx like these before. She denies NSAID use including ibuprofen, naproxen, goodys powder and bc powder. She denies recent antibiotic use. Of note she used to take Buproprion to help cut down on smoking but stopped that for fear it was contributing to her sx. Pt has no other issues or concerns.   Objective:  Vital signs in last 24 hours: Vitals:   09/12/18 0030 09/12/18 0115 09/12/18 0123 09/12/18 0619  BP: 99/61 112/66 111/61   Pulse: 94 96 93   Resp: 19 16 (!) 28   Temp:      TempSrc:      SpO2: 93% 94% 95%   Weight:    113 kg  Height:    5' 3"  (1.6 m)   Physical Exam  Constitutional: She is oriented to person, place, and time.  HENT:  Head: Normocephalic.  Cardiovascular: Normal rate, regular rhythm and normal heart sounds.  No murmur heard. Pulmonary/Chest: Effort normal and breath sounds normal. No respiratory distress.  Abdominal: Soft. Bowel  sounds are normal. She exhibits no distension. There is abdominal tenderness (lower abdomen tenderness). There is no rebound and no guarding.  Neurological: She is alert and oriented to person, place, and time. Gait normal. Coordination normal.  Skin: Skin is warm and dry. No rash noted. She is not diaphoretic. No erythema. No pallor.  Psychiatric: Affect normal.  Nursing note and vitals reviewed.  Labs: Type and Screen: O + Bld cx in progress K 3.3 WBC 14.5 Hgb 9.5 down from 10.2 10 days ago and 11.0 3 weeks ago Urinalysis: yellow, clear, no glucose, Hgb, bili, ketones, protein, nitrite or leuks GI panel pending C diff negative HIV antibody pending Fecal Occult Bld: positive Vitamin B12 pending  Imaging: Chest x-ray: no active disease  Procedures: 2011 Colonoscopy: diverticula, patchy erythema (sent for path), polyps (sent for path), and small lipoma. Pathology revealed patchy active colitis with benign lymphoid aggregates at appendix, ascending colon and sigmoid colon. These were ER and PR positive ruling out endometriosis. 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 No infectious organisms noted however and polyps benign.   CT abdomen pelvis W: colitis spanning the ascending colon to the rectum which may be infectious or inflammatory. 14 mm low-density focus within or immediately adjacent to the sigmoid colon which may be a small intramural abscess or fluid within a diverticulum. No free air.   Assessment/Plan:  Ms. Fairbank is a 58 yo F with previous evidence of colitis on 2011 colonoscopy who came in with 2 months of bloody diarrhea and abdominal pain and a recent fever up to 103 who was found to be febrile, anemic, with a leukocytosis, a positive hemoccult test and CT evidence of colitis and possible abscess concerning for inflammatory bowel disease.   Active Problems:  Bloody diarrhea/abdominal pain -pt presents with 2 month history of  bloody diarrhea with prior colonoscopy biopsies indicating colitis consistent with either IBD, drug induced or infection -pt febrile and leukocytic with colitis and abscess seen on CT -pt has a downtrending Hgb currently at 9.5, hemoccult test positive; pt getting IV iron infusion  -K 3.3 likely secondary to diarrhea and repleted  -GI panel pending, cdiff negative, HIV and B12 pending -GI consulted and recommends full liquid diet, bentyl 10 mg TID AC, IV Cipro and flagyl and a colonoscopy with Dr. Adriana Mccallum this admission -Dr.Mann/Hung will see her tomorrow     DM type 2, non insulin dependent with recent control -hold sulfonylurea and metformin and treat with sliding scale insulin   Dispo: Anticipated discharge upon resolution of hematochezia.   Al Decant, MD 09/12/2018, 6:52 AM Pager: [phone]

## 2018-09-12 NOTE — H&P (Addendum)
.     Date: 09/12/2018               Patient Name:  Kendra Adams MRN: [id number]  DOB: 1961-02-24 Age / Sex: 58 y.o., female   PCP: [name], MD         Medical Service: Internal Medicine Teaching Service         Attending Physician: Dr. Bartholomew Crews, MD    First Contact: Dr. Delice Lesch, MD Pager: [phone]  Second Contact: Dr. Trilby Drummer Pager: [phone]       After Hours (After 5p/  First Contact Pager: [phone]  weekends / holidays): Second Contact Pager: [phone]   Chief Complaint: Rectal bleed  History of Present Illness:  Kendra Adams is a 58 yo female with PMH significant for DM type II, CAD, Hypertension, Hypothyroidism who presents to the ED for 4d hx of rectal bleeding. This has been accompanied by n/v, abd pain and loss of appetite. Fever (tmax 103) has developed over past week. Denies any respiratory sx or body aches. Abd pain is constant--exacerbated with bowel movements. Characterized as stabbing in nature and is diffuse. 10-12 bloody bowel movements a day per patient report. She does pass clots during these bowel movements.   Of note, she was recently seen in the IM clinic for the rectal bleeding and was scheduled for a colonoscopy on Monday.  Denies FH of colon cancer. Does note father had similar sx occurring later in his life. She denies any personal hx of similar sx prior to the past couple of months.    Meds:  No current facility-administered medications on file prior to encounter.    Current Outpatient Medications on File Prior to Encounter  Medication Sig Dispense Refill  . albuterol (PROVENTIL HFA;VENTOLIN HFA) 108 (90 Base) MCG/ACT inhaler Inhale 1-2 puffs into the lungs every 6 (six) hours as needed for wheezing or shortness of breath. 1 Inhaler 0  . aspirin 81 MG chewable tablet Chew 81 mg by mouth every morning.     Marland Kitchen atorvastatin (LIPITOR) 80 MG tablet Take 1 tablet (80 mg total) by mouth daily. 90 tablet 1  . betamethasone dipropionate (DIPROLENE)  0.05 % cream Apply topically 2 (two) times daily. 45 g 2  . Blood Glucose Monitoring Suppl DEVI 1 Device by Does not apply route daily before breakfast. 1 each 0  . buPROPion (WELLBUTRIN SR) 150 MG 12 hr tablet Take 1 tablet by mouth twice daily 60 tablet 0  . fluticasone (FLONASE) 50 MCG/ACT nasal spray Place 2 sprays into both nostrils daily. 16 g 2  . glipiZIDE (GLUCOTROL) 10 MG tablet Take 1 tablet by mouth once daily 90 tablet 0  . Glucose Blood (BLOOD GLUCOSE TEST STRIPS) STRP 1 strip by In Vitro route daily before breakfast. 100 each 0  . Insulin Pen Needle (BD PEN NEEDLE NANO U/F) 32G X 4 MM MISC 1 Units/day by Does not apply route daily. 90 each 3  . Lancets MISC 1 Units by Does not apply route daily. 50 each 0  . levothyroxine (SYNTHROID) 150 MCG tablet TAKE 1 TABLET BY MOUTH ONCE DAILY BEFORE BREAKFAST 90 tablet 0  . loratadine (CLARITIN) 10 MG tablet Take 1 tablet (10 mg total) by mouth daily. 30 tablet 2  . losartan (COZAAR) 100 MG tablet Take 1 tablet (100 mg total) by mouth daily. 90 tablet 1  . metFORMIN (GLUCOPHAGE) 1000 MG tablet Take 1 tablet (1,000 mg total) by mouth 2 (two) times daily. 60 tablet 11  .  metoprolol succinate (TOPROL-XL) 100 MG 24 hr tablet TAKE 1 TABLET BY MOUTH ONCE DAILY WITH  OR  IMMEDIATELY  FOLLOWING  A  MEAL 90 tablet 0  . nitroGLYCERIN (NITROSTAT) 0.4 MG SL tablet Place 1 tablet (0.4 mg total) under the tongue every 5 (five) minutes as needed for chest pain. 25 tablet 3  . Omega-3 Fatty Acids (FISH OIL) 1000 MG CAPS Take 1 capsule by mouth every morning.     Marland Kitchen omeprazole (PRILOSEC) 40 MG capsule TAKE 1 CAPSULE BY MOUTH ONCE DAILY 90 capsule 3  . rOPINIRole (REQUIP) 1 MG tablet Take 1 tablet (1 mg total) by mouth at bedtime. 90 tablet 3  . sertraline (ZOLOFT) 100 MG tablet Take 1.5 tablets (150 mg total) by mouth daily. 45 tablet 11  . simethicone (MYLICON) 80 MG chewable tablet Chew 1 tablet (80 mg total) by mouth every 6 (six) hours as needed for  flatulence (Bloating). 30 tablet 1  . Spacer/Aero-Holding Chambers (AEROCHAMBER PLUS) inhaler Use as instructed 1 each 2  . triamcinolone cream (KENALOG) 0.1 % Apply 1 application topically 2 (two) times daily. 80 g 3   Allergies: Allergies as of 09/11/2018 - Review Complete 09/11/2018  Allergen Reaction Noted  . Gabapentin  07/25/2014  . Nsaids  09/25/2011  . Opium Nausea And Vomiting 05/13/2017  . Tramadol hcl  02/22/2010   Past Medical History:  Diagnosis Date  . Allergic rhinitis   . Asthma   . CAD (coronary artery disease)    a. 12/13/10: mRCA 99%, EF 65-70%;  s/p BMS to mRCA  . COPD (chronic obstructive pulmonary disease) ([place])   . Depression   . DM2 (diabetes mellitus, type 2) ([place])   . GERD (gastroesophageal reflux disease)   . HTN (hypertension)   . Hyperlipidemia   . Hyperlipidemia   . Hypothyroidism   . Seasonal allergies   . Tobacco abuse     Family History:  Family History  Problem Relation Age of Onset  . Diabetes Mother   . Hypertension Mother      Social History:  Father died of suicide (gunshot) in 59. 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     Patient lives with her husband, her husband's niece Tammy who is mentally retarded and her best friend and the friend's daughter and 6 dogs    Review of Systems: A complete ROS was negative except as per HPI.   Physical Exam: Blood pressure 111/61, pulse 93, temperature (!) 102.4 F (39.1 C), temperature source Oral, resp. rate (!) 28, weight 116.6 kg, SpO2 95 %. General: Well appearing. No apparent distress HEENT: head atraumatic. No conjunctival injection. Pharynx non-erythematous. Nares patent Cardiac: RRR. No murmurs appreciated.  Lungs: acyantoic. Lung sounds clear to auscultation.  Abd: slight distension. Bowel sounds present. Moderate tenderness to LLQ. No rebound tenderness.  Skin: no rash or lesions  appreciated   CXR: personally reviewed my interpretation is unremarkable  Assessment & Plan by Problem:  Kendra Adams is a 12yof who presents for 29mohx of rectal bleeding. This has gradually worsened and has been accompanied by fevers (Tmax 103) over last week.   Active Problems:   BRBPR (bright red blood per rectum)  Rectal bleeding. 211mox. Now accompanied by fevers x1w. LLQ tenderness present on admission. -Leukocytosis-WBCs 14.5. Febrile on admission. BC pending. -CT abd/pelvis ordered -slightly anemic--will continue to trend hgb. -consider diverticulitis, autoimmune  colitis, metastatic disease as potential etiologies.   Hypothyroidism-continue home synthroid Hyperlipidemia-continue lipitor DM type II-SSI. Hold home metformin, glipizide Restless leg syndrome: continue requip COPD: continue home medications per RT protocol Tobacco use: continue to hold home bupropion as patient recently discontinued this herself. Depression: continue sertraline  Full code DVT prophylaxis: lovenox   Dispo: Admit patient to Inpatient with expected length of stay greater than 2 midnights.  Signed: Mitzi Hansen, MD 09/12/2018, 2:56 AM  Pager: [phone]

## 2018-09-12 NOTE — ED Notes (Signed)
Delay in lab draw pt not in room.

## 2018-09-13 LAB — CBC
HCT: 23.7 % — ABNORMAL LOW (ref 36.0–46.0)
HCT: 24.1 % — ABNORMAL LOW (ref 36.0–46.0)
Hemoglobin: 7.8 g/dL — ABNORMAL LOW (ref 12.0–15.0)
Hemoglobin: 7.9 g/dL — ABNORMAL LOW (ref 12.0–15.0)
MCH: 28.9 pg (ref 26.0–34.0)
MCH: 28.9 pg (ref 26.0–34.0)
MCHC: 32.9 g/dL (ref 30.0–36.0)
MCHC: 32.8 g/dL (ref 30.0–36.0)
MCV: 87.8 fL (ref 80.0–100.0)
MCV: 88.3 fL (ref 80.0–100.0)
Platelets: 245 10*3/uL (ref 150–400)
Platelets: 247 10*3/uL (ref 150–400)
RBC: 2.7 MIL/uL — ABNORMAL LOW (ref 3.87–5.11)
RBC: 2.73 MIL/uL — ABNORMAL LOW (ref 3.87–5.11)
RDW: 13.7 % (ref 11.5–15.5)
RDW: 13.8 % (ref 11.5–15.5)
WBC: 10.6 10*3/uL — ABNORMAL HIGH (ref 4.0–10.5)
WBC: 8.9 10*3/uL (ref 4.0–10.5)
nRBC: 0.2 % (ref 0.0–0.2)
nRBC: 0.2 % (ref 0.0–0.2)

## 2018-09-13 LAB — GASTROINTESTINAL PANEL BY PCR, STOOL (REPLACES STOOL CULTURE)

## 2018-09-13 LAB — BASIC METABOLIC PANEL
Anion gap: 9 (ref 5–15)
BUN: 6 mg/dL (ref 6–20)
CO2: 23 mmol/L (ref 22–32)
Calcium: 8.1 mg/dL — ABNORMAL LOW (ref 8.9–10.3)
Chloride: 106 mmol/L (ref 98–111)
Creatinine, Ser: 0.7 mg/dL (ref 0.44–1.00)
GFR calc Af Amer: >60 mL/min (ref >60)
GFR calc non Af Amer: >60 mL/min (ref >60)
Glucose, Bld: 155 mg/dL — ABNORMAL HIGH (ref 70–99)
Potassium: 2.8 mmol/L — ABNORMAL LOW (ref 3.5–5.1)
Sodium: 138 mmol/L (ref 135–145)

## 2018-09-13 LAB — GLUCOSE, CAPILLARY
Glucose-Capillary: 133 mg/dL — ABNORMAL HIGH (ref 70–99)
Glucose-Capillary: 161 mg/dL — ABNORMAL HIGH (ref 70–99)
Glucose-Capillary: 149 mg/dL — ABNORMAL HIGH (ref 70–99)
Glucose-Capillary: 139 mg/dL — ABNORMAL HIGH (ref 70–99)
Glucose-Capillary: 160 mg/dL — ABNORMAL HIGH (ref 70–99)

## 2018-09-13 LAB — NOVEL CORONAVIRUS, NAA (HOSP ORDER, SEND-OUT TO REF LAB; TAT 18-24 HRS): SARS-CoV-2, NAA: NOT DETECTED

## 2018-09-13 LAB — MAGNESIUM: Magnesium: 1.3 mg/dL — ABNORMAL LOW (ref 1.7–2.4)

## 2018-09-13 MED ORDER — POTASSIUM CHLORIDE 2 MEQ/ML IV SOLN
INTRAVENOUS | Status: DC
  Administered 2018-09-13 – 2018-09-15 (×5): via INTRAVENOUS
  Filled 2018-09-13 (×7): qty 1000

## 2018-09-13 MED ORDER — POTASSIUM CHLORIDE 20 MEQ PO PACK
40.0000 meq | PACK | Freq: Two times a day (BID) | ORAL | Status: DC
  Administered 2018-09-13: 40 meq via ORAL
  Filled 2018-09-13: qty 2

## 2018-09-13 MED ORDER — MAGNESIUM SULFATE 4 GM/100ML IV SOLN
4.0000 g | Freq: Once | INTRAVENOUS | Status: AC
  Administered 2018-09-13: 4 g via INTRAVENOUS
  Filled 2018-09-13: qty 100

## 2018-09-13 MED ORDER — POTASSIUM CHLORIDE CRYS ER 20 MEQ PO TBCR
40.0000 meq | EXTENDED_RELEASE_TABLET | Freq: Two times a day (BID) | ORAL | Status: AC
  Administered 2018-09-13 (×2): 40 meq via ORAL
  Filled 2018-09-13 (×2): qty 2

## 2018-09-13 NOTE — Progress Notes (Signed)
Student Pharmacist rounding with the IMTS-B1 Service observed a 58 year old female on day 3 of hospital admission for colitis receiving potassium chloride packet 428mq by mouth twice daily for hypokalemia. The medicine team states that she also was having difficulty tolerating and switched her to potassium chloride tablets 46m by mouth twice daily.   I have reviewed the patient's laboratory values and discovered the potassium value of 2.8 mmol/L. Given the potassium value of 2.28m50m/L after one dose potassium chloride 75m57macket, a recommendation was made to check a serum magnesium level. The team has started potassium chloride 75mE55mblets by mouth twice daily and an order for a serum magnesium level has been placed with results pending. Plan to replete magnesium as needed and continue to monitor for resolution of her hypokalemia.  BrennYoulanda Roys year StudeStage manager

## 2018-09-13 NOTE — Progress Notes (Addendum)
Subjective: Kendra Adams seems to be doing fairly well at the present time. I have examined the patient and reviewed her chart.  She was admitted on Saturday 09/11/2018 with rectal bleeding diarrhea a fever. Her GI panel has been negative by PCR.  She is doing fairly well on ciprofloxacin and metronidazole except for mild nausea.  She is tolerating full liquids well. She had some abdominal cramping with some small volume bowel movements with occasional blood and mucus in the stool.  She gives a history of having for small-volume BMs this morning a couple of which were formed stools. She denies having any fever chills or rigors. Her hemoglobin dropped to 7.9 g/dL today from 9.5 gms/dl on admission.  Her potassium is being replenished as she has had some hypokalemia as a result of her diarrhea Her last colonoscopy done in my office in 2011 revealed patchy colitis but the patient's symptoms resolved thereafter she had similar symptoms at this time with rectal bleeding and diarrhea. Biopsies at that time were not specific for ulcerative colitis or Crohn's disease. She was scheduled for colonoscopy at our surgical center today but that has been postponed till she is discharged from the hospital and will be done on an outpatient basis when she has had the antibiotic for at least 7 days.  Objective: Vital signs in last 24 hours: Temp:  [98.4 F (36.9 C)-100.9 F (38.3 C)] 98.4 F (36.9 C) (07/06 1346) Pulse Rate:  [94-118] 94 (07/06 1346) Resp:  [16-19] 16 (07/06 1346) BP: (114-125)/(67-73) 124/70 (07/06 1346) SpO2:  [94 %-96 %] 94 % (07/06 1346) Last BM Date: 09/13/18  Intake/Output from previous day: 07/05 0701 - 07/06 0700 In: 1619.7 [P.O.:540; I.V.:447.5; IV Piggyback:632.2] Out: 30 [Stool:30] Intake/Output this shift: Total I/O In: 1669 [P.O.:480; I.V.:850; IV Piggyback:339] Out: -   General appearance: alert, cooperative, appears stated age, no distress, morbidly obese and pale' patient is  edentulous Resp: clear to auscultation bilaterally Cardio: regular rate and rhythm, S1, S2 normal, no murmur, click, rub or gallop GI: soft, morbidly obese. non-tender; bowel sounds are hyperactive; no masses,  no organomegaly Extremities: extremities normal, atraumatic, no cyanosis or edema  Lab Results: Recent Labs    09/11/18 2328 09/13/18 0341 09/13/18 1638  WBC 14.5* 10.6* 8.9  HGB 9.5* 7.8* 7.9*  HCT 29.4* 23.7* 24.1*  PLT 351 245 247   BMET Recent Labs    09/11/18 2328 09/13/18 0341  NA 136 138  K 3.3* 2.8*  CL 102 106  CO2 23 23  GLUCOSE 187* 155*  BUN 10 6  CREATININE 0.92 0.70  CALCIUM 8.6* 8.1*   LFT Recent Labs    09/11/18 2328  PROT 6.2*  ALBUMIN 2.6*  AST 19  ALT 16  ALKPHOS 105  BILITOT 0.4   C-Diff Recent Labs    09/12/18 0734  CDIFFTOX NEGATIVE   Studies/Results: Ct Abdomen Pelvis W Contrast  Result Date: 09/12/2018 CLINICAL DATA:  Abdominal pain. Patient reports blood in stool for 2 months. EXAM: CT ABDOMEN AND PELVIS WITH CONTRAST TECHNIQUE: Multidetector CT imaging of the abdomen and pelvis was performed using the standard protocol following bolus administration of intravenous contrast. CONTRAST:  139m OMNIPAQUE IOHEXOL 300 MG/ML  SOLN COMPARISON:  CT 08/17/2015 FINDINGS: Lower chest: Compressive right lower lobe atelectasis adjacent to exophytic osteophytes from the thoracic spine. There are coronary artery calcifications. Hepatobiliary: Subcentimeter hypodensity in the right lobe of the liver is too small to characterize. Clips in the gallbladder fossa postcholecystectomy. No biliary dilatation. Pancreas:  No ductal dilatation or inflammation. Spleen: Normal in size without focal abnormality. Adrenals/Urinary Tract: Mild left adrenal thickening without dominant nodule. Normal right adrenal gland. No hydronephrosis or perinephric edema. Homogeneous renal enhancement with symmetric excretion on delayed phase imaging. Tiny cortical hypodensity in  the upper left kidney is too small to characterize. Urinary bladder is nondistended and not well evaluated. Stomach/Bowel: Colonic wall thickening spanning from the ascending colon to the rectum. Associated pericolonic edema. 14 mm low-density in the within or immediately adjacent to the sigmoid colon, image 75 series 3. No perforation. Few scattered colonic diverticula. Normal appendix. No small bowel wall thickening, inflammatory change, or obstruction. Stomach is nondistended. Small duodenal diverticulum without inflammation. Vascular/Lymphatic: Aortic atherosclerosis without aneurysm. Prominent central mesenteric nodes measuring up to 8 mm. There are scattered prominent pericolonic nodes. Reproductive: Chronic left hydrosalpinx, unchanged from prior. Uterus and right adnexa are unremarkable. Other: No free air or free fluid. Tiny fat containing umbilical hernia. Musculoskeletal: Multilevel degenerative change in the spine. Posterior disc osteophyte complex at L3-L4 causes mass effect on the spinal canal. There are no acute or suspicious osseous abnormalities. IMPRESSION: 1. Colitis spanning from the ascending colon to the rectum, may be infectious or inflammatory. There is a 14 mm low-density focus within or immediately adjacent to the sigmoid colon which may be a small intramural abscess or fluid within a diverticulum. No free air. 2. Prominent mesenteric nodes are likely reactive. 3. Chronic left hydrosalpinx. 4. Aortic Atherosclerosis (ICD10-I70.0). Coronary artery calcifications. Electronically Signed   By: Keith Rake M.D.   On: 09/12/2018 03:41   Dg Chest Port 1 View  Result Date: 09/12/2018 CLINICAL DATA:  Fever EXAM: PORTABLE CHEST 1 VIEW COMPARISON:  12/25/2017 FINDINGS: The heart size and mediastinal contours are within normal limits. Both lungs are clear. Degenerative changes of both shoulders. Aortic atherosclerosis. IMPRESSION: No active disease. Electronically Signed   By: Donavan Foil  M.D.   On: 09/12/2018 00:55   Medications: I have reviewed the patient's current medications.  Assessment/Plan: 1) Rectal bleeding with diarrhea and fever and abnormal CT scan revealing colitis from the ascending colon to the rectum-improving on combination of ciprofloxacin and Flagyl. I suspect she may have IBD. A colonoscopy planned within the next week on an outpatient basis.  Avoidance of all artificial sweeteners is encouraged as this can worsen her diarrhea. 2) Posthemorrhagic anemia with hemoglobin down to 7.9 g/dL this evening- we will monitor this closely;  3) Hyperglycemia with morbid obesity monitor closely; check fasting hemoglobin A1c. 4)Aortic atherosclerosis with coronary artery calcifications on CT scan-patient will benefit from a cardiac evaluation considering all her co-morbidities. 5) Hypokalemia-replete as needed.  LOS: 1 day   Juanita Craver 09/13/2018, 6:45 PM

## 2018-09-13 NOTE — Plan of Care (Signed)
  Problem: Elimination: Goal: Will not experience complications related to bowel motility Outcome: Progressing Goal: Will not experience complications related to urinary retention Outcome: Progressing   Problem: Pain Managment: Goal: General experience of comfort will improve Outcome: Progressing   Problem: Skin Integrity: Goal: Risk for impaired skin integrity will decrease Outcome: Progressing

## 2018-09-13 NOTE — Progress Notes (Signed)
MD on call informed of negative GI panel. Arthor Captain LPN

## 2018-09-13 NOTE — Progress Notes (Signed)
Subjective: Patient reports that she is feeling better, endorsing decreased blood, stool volume and firming of her watery stools. Patient still has left lower abdominal, but states that it is much improved from yesterday. Patients was able to feel her fever last night and reports rigors following. Patient denies symptoms of anemia, explicitly no lightheadedness on standing. Patient is on full liquid diet.   Objective:  Vital signs in last 24 hours: Vitals:   09/12/18 0619 09/12/18 1500 09/12/18 2109 09/13/18 0603  BP:  114/71 125/73 114/67  Pulse:  (!) 102 96 (!) 118  Resp:  20 19 18   Temp:  98.8 F (37.1 C) (!) 100.9 F (38.3 C) 99 F (37.2 C)  TempSrc:  Oral Oral Oral  SpO2:  97% 96% 95%  Weight: 113 kg     Height: 5' 3"  (1.6 m)      Physical Exam Constitutional:      Appearance: She is obese.  HENT:     Head: Normocephalic and atraumatic.  Eyes:     Extraocular Movements: Extraocular movements intact.  Cardiovascular:     Rate and Rhythm: Regular rhythm. Tachycardia present.     Heart sounds: Normal heart sounds.  Pulmonary:     Effort: Pulmonary effort is normal.     Breath sounds: Normal breath sounds.  Abdominal:     General: Bowel sounds are normal.     Tenderness: There is no guarding or rebound.     Comments: Tenderness to palpation of LLQ  Skin:    General: Skin is warm.  Neurological:     Mental Status: She is alert.     Weight change:   Intake/Output Summary (Last 24 hours) at 09/13/2018 0753 Last data filed at 09/13/2018 0300 Gross per 24 hour  Intake 1619.65 ml  Output 30 ml  Net 1589.65 ml    Assessment/Plan:  Active Problems:   BRBPR (bright red blood per rectum)   Aortic atherosclerosis (HCC)   Kahmya Pinkham is a 58 y.o. woman with PMHx significant for obesity, asthma, allergic rhinitis, COPD, tobacco use, CAD, DMII, HTN, HLD, GERD, hypothyroidism who presented to the ED two days ago with fever and body aches for about one week (maximum  103F) without new upper respiratory symptoms or sick contacts. Rectal bleeding, fecal urgency, fecal incontinence, abdominal pain, anorexia, dyspnea and lightheadedness on standing has been ongoing for about 2 months now.    1 Bloody Diarrhea Patient with history of rectal bleeding in 2011 with colitis discovered on colonoscopy, presented with a new fever in the setting of chronic bloody diarrhea and abdominal pain that is most likely a recurrence of inflammatory bowel disease. 7/4 CT abdomen revealed colitis spanning from the ascending colon to the rectum, pericolonic edema, and a 32m low density focus that is concerning for an intramural abscess or collection of diverticular fluid.  Given associated systemic symptoms of fever and leukocytosis it is important to rule out acute infection. 7/5 CXR negative, UA negative, blood culture shows no growth at < 24 hours; colonic abscess may be infectious source. Otherwise, HIV negative, hemoccult positive, vitamin B12 is within normal limits [no concern for small bowel involvement or malabsorption], and lactic acid is 0.9. Patient's C diff is negative, GI pathogen panel and COVID screening are still pending. Since her admission, patient remains mildly febrile (maxium 100.46F 7/5) and her leukocytosis is down trending: 7/6 10.6, 7/5 14.5. Prior to admission, patient was being followed by outpatient GI, Dr. MCollene Mareswith original plan  for colonoscopy 7/6. This morning GI has relayed plan to defer the procedure until patient is on antibiotics for at least two weeks. This morning patient reports that she is feeling better, endorsing decreased blood, stool volume, firming stools, no lightheadedness on standing. GI team, [name], will see patient today. Will continue to manage per GI recommendations.   -follow up GI panel, COVID screen -continue full liquid diet -bentyl 33m tid -IV ciprofloxacin, IV metronidazole   2 Iron Deficiency Anemia  Patient's anemia is acute  on chronic episode, most likely secondary to her ongoing GI blood loss. Patient was complaining of dyspnea with exertion and lightheadedness on standing prior to her arrival. This morning she reports improvement of those symptoms and decreased hematochezia. Her iron deficiency anemia was discovered by iron studies 6/9 in clinic with plan to defer iron supplementation until GI work up was complete. Given her ongoing blood loss, hemoglobin continues to down trend: 7/6 7.8, 7/4 9.5, 6/25 10.2, 6/9 11, [12.8 in 2017]. Administered IV iron yesterday. Though patient is presently asymptomatic, her hematochezia continues. Plan for repeat CBC at 5p today. If patient's hemoglobin is < 7, we will move forward with transfusion.    3 Hypokalemia Patient's hypokalemia is likely secondary to her chronic gastrointestinal fluid losses. On arrival to ED, patient's potassium was 7/4 3.3. This morning potassium is 2.8. Patient has been repleted with lactated ringer's, 40 mEq tablet yesterday, and an attempt for 40 mEq powder packed this morning, which patient was unable to keep down. Patient is currently asymptomatic. Plan for 455m tablet in hopes that patient will be able to tolerate it better. Given chronic diarrhea, it is likely that patient may have concomitant hypomagnesemia, which could complicate potassium repletion.   -Mg level -potassium repletion    LOS: 1 day   HoMercy MooreMedical Student 09/13/2018, 11:25 AM

## 2018-09-13 NOTE — Progress Notes (Signed)
0622 Given potassium chloride 40 mEq, pt drank 3/4 of drink and wasn't able to tolerate it well, vomited 1/3 of it.

## 2018-09-14 LAB — BASIC METABOLIC PANEL
Anion gap: 8 (ref 5–15)
BUN: <5 mg/dL — ABNORMAL LOW (ref 6–20)
CO2: 25 mmol/L (ref 22–32)
Calcium: 7.7 mg/dL — ABNORMAL LOW (ref 8.9–10.3)
Chloride: 106 mmol/L (ref 98–111)
Creatinine, Ser: 0.66 mg/dL (ref 0.44–1.00)
GFR calc Af Amer: >60 mL/min (ref >60)
GFR calc non Af Amer: >60 mL/min (ref >60)
Glucose, Bld: 167 mg/dL — ABNORMAL HIGH (ref 70–99)
Potassium: 3.3 mmol/L — ABNORMAL LOW (ref 3.5–5.1)
Sodium: 139 mmol/L (ref 135–145)

## 2018-09-14 LAB — CBC
HCT: 23.8 % — ABNORMAL LOW (ref 36.0–46.0)
Hemoglobin: 7.6 g/dL — ABNORMAL LOW (ref 12.0–15.0)
MCH: 28.5 pg (ref 26.0–34.0)
MCHC: 31.9 g/dL (ref 30.0–36.0)
MCV: 89.1 fL (ref 80.0–100.0)
Platelets: 242 10*3/uL (ref 150–400)
RBC: 2.67 MIL/uL — ABNORMAL LOW (ref 3.87–5.11)
RDW: 13.9 % (ref 11.5–15.5)
WBC: 8.5 10*3/uL (ref 4.0–10.5)
nRBC: 0.2 % (ref 0.0–0.2)

## 2018-09-14 LAB — GLUCOSE, CAPILLARY
Glucose-Capillary: 129 mg/dL — ABNORMAL HIGH (ref 70–99)
Glucose-Capillary: 136 mg/dL — ABNORMAL HIGH (ref 70–99)
Glucose-Capillary: 178 mg/dL — ABNORMAL HIGH (ref 70–99)
Glucose-Capillary: 222 mg/dL — ABNORMAL HIGH (ref 70–99)

## 2018-09-14 LAB — MAGNESIUM: Magnesium: 1.8 mg/dL (ref 1.7–2.4)

## 2018-09-14 MED ORDER — PREDNISONE 20 MG PO TABS
40.0000 mg | ORAL_TABLET | Freq: Every day | ORAL | Status: AC
  Administered 2018-09-15: 40 mg via ORAL
  Filled 2018-09-14: qty 2

## 2018-09-14 MED ORDER — METRONIDAZOLE 500 MG PO TABS: 500.0000 mg | ORAL_TABLET | Freq: Four times a day (QID) | ORAL | Status: DC

## 2018-09-14 MED ORDER — CIPROFLOXACIN HCL 500 MG PO TABS
500.0000 mg | ORAL_TABLET | Freq: Two times a day (BID) | ORAL | Status: DC
  Administered 2018-09-14 – 2018-09-15 (×2): 500 mg via ORAL
  Filled 2018-09-14 (×2): qty 1

## 2018-09-14 MED ORDER — METRONIDAZOLE 500 MG PO TABS
500.0000 mg | ORAL_TABLET | Freq: Three times a day (TID) | ORAL | Status: DC
  Administered 2018-09-14 – 2018-09-15 (×5): 500 mg via ORAL
  Filled 2018-09-14 (×5): qty 1

## 2018-09-14 MED ORDER — CIPROFLOXACIN HCL 500 MG PO TABS: 500.0000 mg | ORAL_TABLET | Freq: Two times a day (BID) | ORAL | Status: DC

## 2018-09-14 MED ORDER — METHYLPREDNISOLONE SODIUM SUCC 125 MG IJ SOLR
60.0000 mg | Freq: Every day | INTRAMUSCULAR | Status: DC
  Administered 2018-09-14: 60 mg via INTRAVENOUS
  Filled 2018-09-14: qty 2

## 2018-09-14 MED ORDER — POTASSIUM CHLORIDE CRYS ER 20 MEQ PO TBCR
40.0000 meq | EXTENDED_RELEASE_TABLET | Freq: Two times a day (BID) | ORAL | Status: DC
  Administered 2018-09-14 (×2): 40 meq via ORAL
  Filled 2018-09-14 (×2): qty 2

## 2018-09-14 NOTE — Progress Notes (Signed)
Student Pharmacist rounding with the IMTS-B1 service was consulted on the conversion of intravenous ciprofloxacin and metronidazole to oral administration. The patient is a 58 year old female on day 2 of hospital admission for bright red blood per rectum (BRBPR). She is being treated with ciprofloxacin and metronidazole in preparation for a colonoscopy per GI recommendations. I have reviewed the patient's profile, and her most current CrCl is calculated at 91.6 mL/min. It is known that the IV: PO conversion of ciprofloxacin IV: PO is 1:1.25, and metronidazole is 1:1. Using these conversions, ciprofloxacin was changed from 448m IV to 5021mPO, and metronidazole remains at 50079mContinue to monitor for adverse effects such as nausea and vomiting and allergic reactions.   BreYoulanda Adams year [employer] manager

## 2018-09-14 NOTE — Progress Notes (Signed)
Student Pharmacist rounding with the IMTS-B1 service was consulted for glucose management in 58 year old patient potentially starting a 4-8 week regimen of prednisone per GI recommendations. The patient is a 58 year old female on day 2 of hospital admission for bright red blood per rectum (BRBPR). She was managing her type II diabetes prior to admission with metformin 1047m by mouth twice daily and glipizide 1100mby mouth once daily. I have reviewed the patient's chart and the most recent blood glucose value is 136 mg/dL on July 7th. At this time, recommend continuing to monitor blood glucose. No changes should be made to home diabetes management. Continue to follow blood glucose values after discharge and inform the patient to report values to her care team. She is currently on methylprednisolone sodium succinate 6024mntravenous. The prednisone equivalent dose is 59m72mducate the patient on side effects of long term steroid use such as hyperglycemia, risk of infection and mental disturbances.    BrenYoulanda Roysh year Stud[employer] manager

## 2018-09-14 NOTE — Progress Notes (Signed)
Subjective: Since I last evaluated the patient, she had several loose bowel movements through the night.  Seems to be doing much better.  She claims she has had 4 small-volume bowel movements this morning even though she had several loose bowel movements through the night.  She denies having any abdominal pain nausea vomiting.  She has seen Dr. Ottie Glazier in the past and has had a stress test done.  She has had chronic back pain and has injured her back in several locations and is aware of the CT findings.  On admission where she was noted to have posterior disc osteophyte complex at L3-L4 causing a mass-effect of the spinal canal.  Objective: Vital signs in last 24 hours: Temp:  [98.4 F (36.9 C)-100 F (37.8 C)] 100 F (37.8 C) (07/07 0457) Pulse Rate:  [88-95] 95 (07/07 0457) Resp:  [16-20] 20 (07/07 0457) BP: (124-131)/(70-71) 131/70 (07/07 0457) SpO2:  [93 %-96 %] 93 % (07/07 0457) Last BM Date: 09/13/18  Intake/Output from previous day: 07/06 0701 - 07/07 0700 In: 1669 [P.O.:480; I.V.:850; IV Piggyback:339] Out: -  Intake/Output this shift: Total I/O In: 360 [P.O.:360] Out: -   General appearance: alert, cooperative, appears stated age, no distress, morbidly obese and pale Resp: clear to auscultation bilaterally Cardio: regular rate and rhythm, S1, S2 normal, no murmur, click, rub or gallop GI: soft, non-tender; bowel sounds normal; no masses,  no organomegaly Extremities: extremities normal, atraumatic, no cyanosis or edema  Lab Results: Recent Labs    09/13/18 0341 09/13/18 1638 09/14/18 0211  WBC 10.6* 8.9 8.5  HGB 7.8* 7.9* 7.6*  HCT 23.7* 24.1* 23.8*  PLT 245 247 242   BMET Recent Labs    09/11/18 2328 09/13/18 0341 09/14/18 0211  NA 136 138 139  K 3.3* 2.8* 3.3*  CL 102 106 106  CO2 23 23 25   GLUCOSE 187* 155* 167*  BUN 10 6 <5*  CREATININE 0.92 0.70 0.66  CALCIUM 8.6* 8.1* 7.7*   LFT Recent Labs    09/11/18 2328  PROT 6.2*  ALBUMIN 2.6*   AST 19  ALT 16  ALKPHOS 105  BILITOT 0.4    C-Diff Recent Labs    09/12/18 0734  CDIFFTOX NEGATIVE   Studies/Results: No results found.  Medications: I have reviewed the patient's current medications.  Assessment/Plan: 1) Acute colitis on CT scan with rectal bleeding and anemia secondary to GI bleed seems to have responded well to Solu-Medrol.  She is feeling much better this evening and is grateful for the care she has received while she is been hospitalized.   I spoke to Dr. Maryland Pink in radiology at length about the patient CT scan that revealed a possible abscess in the sigmoid colon.  Therefore I do not feel doing a colonoscopy at this time is appropriate as there is a high risk of perforation.  Patient will be treated with prednisone taper and a colonoscopy will be done 6 to 8 weeks from now.  I have discussed this with the patient in great length I think it is appropriate for the patient to be discharged on a prednisone taper and I will see her in follow-up and make further arrangements as needed for her GI work-up.  I think the patient can get prednisone 62m for a week and then 30 mg for 7 days 20 mg for 7 days 10 mg for 7 days and 5 mg for 7 days.  If there are any questions or concerns feel free to  contact me.  I think would be prudent to recheck her potassium levels before discharge.    LOS: 2 days   Juanita Craver 09/14/2018, 10:17 AM

## 2018-09-14 NOTE — Progress Notes (Signed)
Subjective: Patient reports decreased frequency of her stools: 5 bowels movements since ~9pm last night, two of which were incontinent episodes in her bed. She reports consistent hematochezia and watery stools that are preceded by left lower quadrant pain that resolves with finishing her bowel movements. Patient is on a full liquid diet. She reports decreased appetite and intake given prandial nausea and pain. She reports nausea and retching after antibiotic administration yesterday, but otherwise denies episodes of emesis. Patient is denies lightheadedness, dizziness, or dyspnea on standing, states she's "not swimmy headed anymore".   Objective:  Vital signs in last 24 hours: Vitals:   09/13/18 0603 09/13/18 1346 09/13/18 2135 09/14/18 0457  BP: 114/67 124/70 127/71 131/70  Pulse: (!) 118 94 88 95  Resp: 18 16 16 20   Temp: 99 F (37.2 C) 98.4 F (36.9 C) 99.3 F (37.4 C) 100 F (37.8 C)  TempSrc: Oral Oral Oral Oral  SpO2: 95% 94% 96% 93%  Weight:      Height:       Weight change:   Intake/Output Summary (Last 24 hours) at 09/14/2018 0829 Last data filed at 09/13/2018 1528 Gross per 24 hour  Intake 1668.96 ml  Output --  Net 1668.96 ml   Physical Exam Constitutional:      General: She is not in acute distress.    Appearance: She is obese. She is not diaphoretic.     Comments: Patient is lying comfortably in bed, conversant, engages appropriately with team  HENT:     Head: Normocephalic and atraumatic.  Eyes:     Extraocular Movements: Extraocular movements intact.  Cardiovascular:     Rate and Rhythm: Normal rate.     Heart sounds: Normal heart sounds. No murmur.  Pulmonary:     Effort: Pulmonary effort is normal.     Breath sounds: Normal breath sounds. No wheezing.  Abdominal:     General: Abdomen is protuberant.     Palpations: Abdomen is soft.     Tenderness: There is no abdominal tenderness.  Skin:    General: Skin is warm.     Findings: No erythema or rash.   Neurological:     Mental Status: She is alert.     Cranial Nerves: No cranial nerve deficit.  Psychiatric:        Mood and Affect: Mood normal.     Assessment/Plan:  Active Problems:   BRBPR (bright red blood per rectum)   Aortic atherosclerosis (HCC)  Kendra Adams is a 58 y.o. woman with PMHx significant for obesity, asthma, allergic rhinitis, COPD, tobacco use, CAD, DMII, HTN, HLD, GERD, hypothyroidism who presented to the ED three days ago with fever and body aches for about one week (maximum 103F) without new upper respiratory symptoms or sick contacts. Rectal bleeding, fecal urgency, fecal incontinence, abdominal pain, anorexia, dyspnea and lightheadedness on standing has been ongoing for about 2 months now.    1 Bloody Diarrhea Patient with history of rectal bleeding in 2011 with active colitis discovered on colonoscopy, presented with a new fever in the setting of chronic bloody diarrhea and abdominal pain that is most likely a recurrence of inflammatory bowel disease with superimposed infection. 7/4 CT abdomen revealed colitis spanning from the ascending colon to the rectum, pericolonic edema, and a 14m low density focus that is concerning for an intramural abscess or collection of diverticular fluid. GI pathogen panel is negative and blood cultures drawn 7/4 show no growth. Patient is doing well on antibiotics, day  3. Patient's vitals are within normal limits: she is afebrile, with maximum temperature of 100F over the last 24 hours. Patient's leukocytosis has resolved: WBC count has been within normal limits for two days now, 7/7 8.5.  Patient is being followed by GI: per [name] 7/6 note, plan is to schedule an outpatient colonoscopy after > 7 days of antibiotics. Today patient endorses that she is feeling better, but has reservations about going home. Patient desires to have fully formed stools before leaving and notes the impact that her frequent stools, fecal urgency and  incontinence have on her quality of life. We discussed that the goal before discharge is clinical improvement so that the patients is able to manage her symptoms at home and proceed with GI workup for diagnosis and treatment. Patient is amenable to another day of observation.  [name] will see patient today. We will continue to manage per GI recommendations.   -continue full liquid diet -follow up blood cultures  -transition to oral antibiotics: ciprofloxacin 534m bid, metronidazole 5015mqid -zofran as needed for nausea -bentyl 1015mid   2 Iron Deficiency Anemia Patient's anemia is acute on chronic episode, most likely secondary to her ongoing GI blood loss. Patient was complaining of dyspnea with exertion and lightheadedness on standing prior to her arrival. This morning she reports continued improvement of those symptoms stating that she is no longer "swimmy headed". Patient's hemoglobin has stabilized: 7/7 7.6, 7/6 7.9 and she reports improvement of her hematochezia. We will continue to monitor her hemoglobin with plan to transfuse if Hgb < 7 or patient becomes symptomatic.       3 Hypokalemia/Hypomagnesemia  Patient's hypokalemia and hypomagnesemia are likely secondary to her chronic gastrointestinal fluid losses.  7/6 Mg level of 1.3 has been repleted to 7/7 1.8.  7/6 K level of 2.8 has been repleted to 7/7 3.3. Patient tolerates the potassium tablet much better than the powder packet.  -Mg level, BMP -replete electrolytes as needed    LOS: 2 days   Kendra Mooreedical Student 09/14/2018, 10:32 AM

## 2018-09-14 NOTE — Progress Notes (Addendum)
7/5 abdominal CT   Musculoskeletal: Multilevel degenerative change in the spine. Posterior disc osteophyte complex at L3-L4 causes mass effect on the spinal canal. There are no acute or suspicious osseous abnormalities.    Today I discussed the significance of Kendra Adams's musculoskeletal CT findings with radiology. Per radiology, the patient has several anterior and posterior osteophytes and spinal stenosis at the L3 and L4 associated with the osteophyte complex noted. Recommendation for MRI if patient is symptomatic, but otherwise she can follow up with neurosurgery outpatient. Per chart history, the presence of osteophytes is noted in a 2015 lumbar spine xray and she has history CPPD arthropathy with osteoarthritis. Patient has a long history of chronic back and recounts several traumatic incidences since her youth, but denies any recent worsening or changes in her pain over the last few years. 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 Patient reports for the last week she has felt a new strange sensation "like a numbness" in her left anterior thigh and dorsum of left foot, which she attributed to her diabetes.  Patient denies saddle anesthesia, urinary incontinence, and lower extremity numbness or weakness.  On exam, neurologically patient has 5/5 strength in lower extremities, altered sensation at dorsum of left foot, but otherwise gross sensation intact, 2+ patellar and Achilles reflexes, and negative straight leg test bilaterally.    [name], Medical Student

## 2018-09-15 ENCOUNTER — Telehealth: Payer: Self-pay

## 2018-09-15 ENCOUNTER — Telehealth: Payer: Self-pay | Admitting: Dietician

## 2018-09-15 LAB — BASIC METABOLIC PANEL
Anion gap: 8 (ref 5–15)
BUN: 6 mg/dL (ref 6–20)
CO2: 25 mmol/L (ref 22–32)
Calcium: 7.9 mg/dL — ABNORMAL LOW (ref 8.9–10.3)
Chloride: 108 mmol/L (ref 98–111)
Creatinine, Ser: 0.8 mg/dL (ref 0.44–1.00)
GFR calc Af Amer: >60 mL/min (ref >60)
GFR calc non Af Amer: >60 mL/min (ref >60)
Glucose, Bld: 127 mg/dL — ABNORMAL HIGH (ref 70–99)
Potassium: 4.4 mmol/L (ref 3.5–5.1)
Sodium: 141 mmol/L (ref 135–145)

## 2018-09-15 LAB — CBC
HCT: 24 % — ABNORMAL LOW (ref 36.0–46.0)
Hemoglobin: 7.6 g/dL — ABNORMAL LOW (ref 12.0–15.0)
MCH: 28.5 pg (ref 26.0–34.0)
MCHC: 31.7 g/dL (ref 30.0–36.0)
MCV: 89.9 fL (ref 80.0–100.0)
Platelets: 265 10*3/uL (ref 150–400)
RBC: 2.67 MIL/uL — ABNORMAL LOW (ref 3.87–5.11)
RDW: 13.9 % (ref 11.5–15.5)
WBC: 10 10*3/uL (ref 4.0–10.5)
nRBC: 0.4 % — ABNORMAL HIGH (ref 0.0–0.2)

## 2018-09-15 LAB — GLUCOSE, CAPILLARY
Glucose-Capillary: 113 mg/dL — ABNORMAL HIGH (ref 70–99)
Glucose-Capillary: 174 mg/dL — ABNORMAL HIGH (ref 70–99)

## 2018-09-15 LAB — MAGNESIUM: Magnesium: 1.5 mg/dL — ABNORMAL LOW (ref 1.7–2.4)

## 2018-09-15 MED ORDER — ONDANSETRON HCL 4 MG PO TABS: 4.0000 mg | ORAL_TABLET | Freq: Four times a day (QID) | ORAL | 0 refills | Status: DC | PRN

## 2018-09-15 MED ORDER — CIPROFLOXACIN HCL 500 MG PO TABS: 500.0000 mg | ORAL_TABLET | Freq: Two times a day (BID) | ORAL | 0 refills | Status: DC

## 2018-09-15 MED ORDER — CONTOUR TEST VI STRP: ORAL_STRIP | 12 refills | Status: DC

## 2018-09-15 MED ORDER — LANCETS MISC: 1.0000 [IU] | Freq: Every day | 0 refills | Status: DC

## 2018-09-15 MED ORDER — MAGNESIUM SULFATE 4 GM/100ML IV SOLN
4.0000 g | Freq: Once | INTRAVENOUS | Status: AC
  Administered 2018-09-15: 4 g via INTRAVENOUS
  Filled 2018-09-15: qty 100

## 2018-09-15 MED ORDER — DICYCLOMINE HCL 10 MG PO CAPS: 10.0000 mg | ORAL_CAPSULE | Freq: Three times a day (TID) | ORAL | 0 refills | Status: DC

## 2018-09-15 MED ORDER — PREDNISONE 10 MG PO TABS: ORAL_TABLET | ORAL | 0 refills | Status: DC

## 2018-09-15 MED ORDER — METRONIDAZOLE 500 MG PO TABS: 500.0000 mg | ORAL_TABLET | Freq: Three times a day (TID) | ORAL | 0 refills | Status: DC

## 2018-09-15 MED FILL — ONDANSETRON HCL 4 MG TABLET: 4 | 5 days supply | Qty: 20 | Fill #0

## 2018-09-15 MED FILL — CIPROFLOXACIN HCL 500 MG TA: 500 | 4 days supply | Qty: 7 | Fill #0

## 2018-09-15 MED FILL — metroNIDAZOLE 500 MG TABS: 500 | 4 days supply | Qty: 11 | Fill #0

## 2018-09-15 MED FILL — DICYCLOMINE HCL 10 MG CAPS: 10 | 30 days supply | Qty: 90 | Fill #0

## 2018-09-15 MED FILL — predniSONE 10 MG TABS: 10 | 34 days supply | Qty: 74 | Fill #0

## 2018-09-15 NOTE — Discharge Instructions (Signed)
You were admitted to the hospital with bloody diarrhea and fever and found to have acute colitis with an abscess. You also had anemia due to blood loss and low potassium levels secondary to diarrhea. You were started on antibiotics which you will continue outpatient. You were given an IV iron infusion for your anemia which has stabilized while you were in the hospital. You were given oral potassium supplementation. You were seen by a GI doctor who recommended steroids for 5 weeks after which you will undergo a colonoscopy to evaluate for inflammatory bowel disease. Please see below for your steroid taper schedule. Steroids can increase blood sugar levels so it is important to check your blood sugars at home. We have given you supplies to check your blood sugar as well as a sample of a long acting insulin. We will be in touch and feel free to reach out to [facility] as well about your blood sugar levels and we may recommend that you start using insulin. We have given you some insulin just in case. Please refrigerate. You should follow up in clinic on Wednesday July 15th, 2020 at 9:45 for a general hospital follow up as well as in our steroid hyperglycemia clinic to monitor your blood sugar while on steroids. Thank you!  Steroid taper:  First week 07/08/-07/14: 4 pills (40 mg) daily  Second week 07/15-07/21: 3 pills (30 mg) daily  Third week 07/22-07/28: 2 pills (20 mg) daily  Fourth week 07/29-08/04: 1 pills (10 mg) daily  Fifth week 08/05-08/11: one half of a pill (5 mg) daily

## 2018-09-15 NOTE — Discharge Summary (Signed)
Name: Kendra Adams MRN: [id number] DOB: [birth year] 58 y.o. PCP: [name], MD  Date of Admission: 09/11/2018 11:07 PM Date of Discharge: 09/15/2018 Attending Physician: No att. providers found  Discharge Diagnosis: 1. Acute colitis and abscess  2. Anemia  Discharge Medications: Allergies as of 09/15/2018      Reactions   Gabapentin    Skin rash, lip swelling   Nsaids    IBU - Rectal bleeds; states ASA & Tylenol OK   Opium Nausea And Vomiting   Tramadol Hcl    "Just doesn't do anything for me"      Medication List    STOP taking these medications   Blood Glucose Monitoring Suppl Devi   Insulin Pen Needle 32G X 4 MM Misc Commonly known as: BD Pen Needle Nano U/F     TAKE these medications   AeroChamber Plus inhaler Use as instructed   albuterol 108 (90 Base) MCG/ACT inhaler Commonly known as: VENTOLIN HFA Inhale 1-2 puffs into the lungs every 6 (six) hours as needed for wheezing or shortness of breath.   aspirin 81 MG chewable tablet Chew 81 mg by mouth every morning.   atorvastatin 80 MG tablet Commonly known as: LIPITOR Take 1 tablet (80 mg total) by mouth daily.   betamethasone dipropionate 0.05 % cream Commonly known as: DIPROLENE Apply topically 2 (two) times daily. What changed:   how much to take  when to take this  reasons to take this   buPROPion 150 MG 12 hr tablet Commonly known as: WELLBUTRIN SR Take 1 tablet by mouth twice daily   ciprofloxacin 500 MG tablet Commonly known as: CIPRO Take 1 tablet (500 mg total) by mouth 2 (two) times daily.   Contour Test test strip Generic drug: glucose blood Use as instructed What changed:   how much to take  how to take this  when to take this  additional instructions   dicyclomine 10 MG capsule Commonly known as: BENTYL Take 1 capsule (10 mg total) by mouth 3 (three) times daily before meals. IM Program   Fish Oil 1000 MG Caps Take 1 capsule by mouth every morning.    fluticasone 50 MCG/ACT nasal spray Commonly known as: Flonase Place 2 sprays into both nostrils daily. What changed:   when to take this  reasons to take this   glipiZIDE 10 MG tablet Commonly known as: GLUCOTROL Take 1 tablet by mouth once daily   Lancets Misc 1 Units by Does not apply route daily.   levothyroxine 150 MCG tablet Commonly known as: SYNTHROID TAKE 1 TABLET BY MOUTH ONCE DAILY BEFORE BREAKFAST What changed:   how much to take  how to take this  when to take this  additional instructions   loratadine 10 MG tablet Commonly known as: CLARITIN Take 1 tablet (10 mg total) by mouth daily.   losartan 100 MG tablet Commonly known as: COZAAR Take 1 tablet (100 mg total) by mouth daily.   metFORMIN 1000 MG tablet Commonly known as: GLUCOPHAGE Take 1 tablet (1,000 mg total) by mouth 2 (two) times daily.   metoprolol succinate 100 MG 24 hr tablet Commonly known as: TOPROL-XL TAKE 1 TABLET BY MOUTH ONCE DAILY WITH  OR  IMMEDIATELY  FOLLOWING  A  MEAL What changed:   how much to take  how to take this  when to take this  additional instructions   metroNIDAZOLE 500 MG tablet Commonly known as: FLAGYL Take 1 tablet (500 mg total) by mouth  every 8 (eight) hours.   nitroGLYCERIN 0.4 MG SL tablet Commonly known as: NITROSTAT Place 1 tablet (0.4 mg total) under the tongue every 5 (five) minutes as needed for chest pain.   omeprazole 40 MG capsule Commonly known as: PRILOSEC TAKE 1 CAPSULE BY MOUTH ONCE DAILY   ondansetron 4 MG tablet Commonly known as: ZOFRAN Take 1 tablet (4 mg total) by mouth every 6 (six) hours as needed for nausea.   predniSONE 10 MG tablet Commonly known as: DELTASONE 4 pills daily 1st week, 3 pills daily 2nd week, 2 pills daily 3rd week, 1 pill daily 4th week, 1 half pill daily 5th week.   rOPINIRole 1 MG tablet Commonly known as: REQUIP Take 1 tablet (1 mg total) by mouth at bedtime.   sertraline 100 MG tablet Commonly  known as: ZOLOFT Take 1.5 tablets (150 mg total) by mouth daily.   simethicone 80 MG chewable tablet Commonly known as: MYLICON Chew 1 tablet (80 mg total) by mouth every 6 (six) hours as needed for flatulence (Bloating).   triamcinolone cream 0.1 % Commonly known as: KENALOG Apply 1 application topically 2 (two) times daily. What changed:   when to take this  reasons to take this       Disposition and follow-up:   Kendra Adams was discharged from [facility] in Stable condition.  At the hospital follow up visit please address:  1.  Please check that patient is continuing/has completed her antibiotics and that she is continuing her steroid and understands her steroid taper.  2. Please address glucose control and that patient understands how to check her sugar and give herself insulin if indicated. Please confirm follow up in steroid induced hyperglycemia clinic.  3. Please complete cardiac clearance for patient's colonoscopy.   4.  Labs needed at time of follow-up: CBC and BMP to monitor Hgb and K  Follow-up Appointments: Follow-up [place] Follow up in 1 week(s).   Why: Hospital follow up appointment scheduled for Wednesday July 15th, 2020 at 9:45a. If you cannot keep this appointment, please call the clinic and reschedule.  Contact information: [address] [place] [phone]       Dorothy Spark, MD .   Specialty: Cardiology Contact information: [address] [place] [id number] [phone]           Hospital Course by problem list: 1. Acute colitis and abscess -Kendra Adams is a 58 y.o. woman with PMHx significant for obesity, asthma, allergic rhinitis, COPD, tobacco use, CAD, DMII, HTN, HLD, GERD, hypothyroidism who presented to the ED 09/11/2018 with fever and body aches for about one week in the setting of bloody diarrhea for 2 months.   -Patient with history of rectal bleeding in 2011 with active colitis discovered on colonoscopy, pattern consistent with Crohn disease, drug induced injury vs infection.  -7/4 CT abdomen revealed colitis spanning from the ascending colon to the rectum, pericolonic edema, and a 4mlow densityfocus that is concerning for an intramural abscessor collection of diverticular fluid.  -Patient's microbiology and infectious workup was negative. GI believes her picture is most consistent with IBD to be confirmed with outpatient colonoscopy. -Patient received cipro/flagyl and responded well with resolution of fevers and downtrending WBC and clinical improvement of stool frequency, volume, hematochezia, and anorexia -Given GI concern for intra abdominal abscess and perforation risk, patient was started on high dose steroid therapy with  plans to defer colonoscopy 6-8 weeks.  -Established care with clinic for steroid hyperglycemia prevention and management.   2. Anemia -pt presented with 2 mo hx of blood diarrhea -Hgb 9.5 on admission which was down from 11 one month prior -downtrended to 7.6 on admission and remained stable with IV iron infusion -likely iron deficiency anemia secondary to subacute blood loss -pt Hgb stable at 7.6 on discharge with CBC follow up recommended at hospital follow up and more definitive work up and treatment to follow after colonoscopy   Discharge Vitals:   BP (!) 127/55 (BP Location: Right Arm)   Pulse (!) 103   Temp 98.3 F (36.8 C) (Oral)   Resp 14   Ht 5' 3"  (1.6 m)   Wt 113 kg   SpO2 95%   BMI 44.13 kg/m   Pertinent Labs, Studies, and Procedures:  CBC Latest Ref Rng & Units 09/15/2018 09/14/2018 09/13/2018  WBC 4.0 - 10.5 K/uL 10.0 8.5 8.9  Hemoglobin 12.0 - 15.0 g/dL 7.6(L) 7.6(L) 7.9(L)  Hematocrit 36.0 - 46.0 % 24.0(L) 23.8(L) 24.1(L)  Platelets 150 - 400 K/uL 265 242 247   BMP Latest Ref Rng & Units 09/15/2018 09/14/2018 09/13/2018  Glucose 70 - 99 mg/dL 127(H)  167(H) 155(H)  BUN 6 - 20 mg/dL 6 <5(L) 6  Creatinine 0.44 - 1.00 mg/dL 0.80 0.66 0.70  BUN/Creat Ratio 9 - 23 - - -  Sodium 135 - 145 mmol/L 141 139 138  Potassium 3.5 - 5.1 mmol/L 4.4 3.3(L) 2.8(L)  Chloride 98 - 111 mmol/L 108 106 106  CO2 22 - 32 mmol/L 25 25 23   Calcium 8.9 - 10.3 mg/dL 7.9(L) 7.7(L) 8.1(L)   CT abdomen pelvis w contrast: 1. Colitis spanning from the ascending colon to the rectum, may be infectious or inflammatory. There is a 14 mm low-density focus within or immediately adjacent to the sigmoid colon which may be a small intramural abscess or fluid within a diverticulum. No free air. 2. Prominent mesenteric nodes are likely reactive. 3. Chronic left hydrosalpinx. 4. Aortic atherosclerosis. Coronary artery calcifications.  Discharge Instructions: Discharge Instructions    Call MD for:  persistant dizziness or light-headedness   Complete by: As directed    Call MD for:  temperature >100.4   Complete by: As directed    Diet - low sodium heart healthy   Complete by: As directed    Increase activity slowly   Complete by: As directed       Signed: Al Decant, MD 09/17/2018, 8:43 AM   Pager: [phone]

## 2018-09-15 NOTE — Progress Notes (Signed)
Subjective: Since I last evaluated the patient, she seems to be doing a lot better today. She denies having any abdominal pain, nausea or vomiting. She has tolerated her heart healthy diet well. She has had 2 soft BM's today. There is a strek of blood in the stool.   Objective: Vital signs in last 24 hours: Temp:  [98.3 F (36.8 C)-98.5 F (36.9 C)] 98.3 F (36.8 C) (07/08 1445) Pulse Rate:  [77-103] 103 (07/08 1445) Resp:  [14-18] 14 (07/08 1445) BP: (109-147)/(53-66) 127/55 (07/08 1445) SpO2:  [94 %-95 %] 95 % (07/08 1445) Last BM Date: 09/15/18  Intake/Output from previous day: 07/07 0701 - 07/08 0700 In: 1360 [P.O.:360; I.V.:1000] Out: -  Intake/Output this shift: Total I/O In: 800 [P.O.:800] Out: -   General appearance: alert, cooperative, no distress, morbidly obese and pale Resp: clear to auscultation bilaterally Cardio: regular rate and rhythm, S1, S2 normal, no murmur, click, rub or gallop GI: soft, non-tender; bowel sounds normal; no masses,  no organomegaly Extremities: extremities normal, atraumatic, no cyanosis or edema  Lab Results: Recent Labs    09/13/18 1638 09/14/18 0211 09/15/18 0340  WBC 8.9 8.5 10.0  HGB 7.9* 7.6* 7.6*  HCT 24.1* 23.8* 24.0*  PLT 247 242 265   BMET Recent Labs    09/13/18 0341 09/14/18 0211 09/15/18 0340  NA 138 139 141  K 2.8* 3.3* 4.4  CL 106 106 108  CO2 23 25 25   GLUCOSE 155* 167* 127*  BUN 6 <5* 6  CREATININE 0.70 0.66 0.80  CALCIUM 8.1* 7.7* 7.9*   Medications: I have reviewed the patient's current medications.  Assessment/Plan: 1) Colitis on CT scan with rectal anemia and post-hemorrhagic anemia-we will treat her with a Prednisone taper and do a colonoscopy in 6-8 weeks. I have discussed this with Dr. Madilyn Fireman today. A low fat, diet has been emphasized. She will see me in the office in follow up.  2) DJD/Spinal stenosis.Marland Kitchen 3) AODM/Morbid obesity.  LOS: 3 days   Juanita Craver 09/15/2018, 3:04 PM

## 2018-09-15 NOTE — Discharge Planning (Signed)
Student pharmacist rounding with the [employer] service was consulted on first dose education for prednisone. Patient is a 58 year old female being discharged on a 5 week prednisone taper. At bedside with patient, education on administration, side effects was given in addition to a handout. Patient was able to provide teach-back and questions were answered.  Youlanda Roys, 4th year Stage manager

## 2018-09-15 NOTE — Progress Notes (Signed)
Discharged Pt to home. Instructions given and explained. Belongings returned accordingly.

## 2018-09-15 NOTE — Telephone Encounter (Signed)
Steroid-Induced Hyperglycemia Prevention and Management Kendra Adams is a 58 y.o. female who meets criteria for [facility] glucose monitoring program (diabetes patient prescribed short course of steroids).  A/P Current Regimen  Patient prescribed prednisone 40/30/20/10/5 mg daily each week x 5 weeks, currently on day 0 of therapy. Patient taking prednisone in the AM  Prednisone indication: IBD  Current DM regimen metformin and glipizide  Home BG Monitoring  Patient does not have a meter at home and does check BG at home. Meter was supplied.- contour next with 30 strips  CBGs at home unknown prior to steroid course, A1C prior to steroid course 7.2  S/Sx of hyper- or hypoglycemia: none, none  Medication Management  Switch prednisone dose to AM yes  Additional treatment for BG control is indicated at this time.  Adding basal insulin on discharge home from hospital  Physician preference level per protocol: Dr. Lynnae January who requested the service is a level "1"  Medication supply Samples of meter, strips, levemir pen and pen needles provided  Patient Education  Advised patient to monitor BG while on steroid therapy (at least twice daily prior to first 2 meals of the day).  Patient educated about signs/symptoms and advised to contact clinic if hyper- or hypoglycemic.  Patient did  verbalize understanding of information and regimen by repeating back topics discussed.  Follow-up daily  hospital follow up on 09/22/2018  Kendra Adams [name] 11:39 AM 09/15/2018

## 2018-09-15 NOTE — Progress Notes (Signed)
Subjective: No acute events overnight. Kendra Adams reports 4 total stools last night: she endorses decreased hematochezia, decreased volume and firming of her stools. Patient continues to have premonitory left lower quadrant abdominal pain and cramping that resolves with her bowel movements. Patient is tolerating her soft diet well: reports having a full dinner last night without prandial/postprandial pain or vomiting.   Objective:  Vital signs in last 24 hours: Vitals:   09/14/18 0457 09/14/18 1236 09/14/18 2120 09/15/18 0546  BP: 131/70 124/70 (!) 109/53 (!) 147/66  Pulse: 95 86 93 77  Resp: 20 18 18 16   Temp: 100 F (37.8 C) 98.4 F (36.9 C) 98.5 F (36.9 C) 98.3 F (36.8 C)  TempSrc: Oral Oral Oral Oral  SpO2: 93% 96% 95% 94%  Weight:      Height:       Weight change:   Intake/Output Summary (Last 24 hours) at 09/15/2018 0945 Last data filed at 09/15/2018 0940 Gross per 24 hour  Intake 1560 ml  Output --  Net 1560 ml   Physical Exam Vitals signs reviewed.  Constitutional:      Appearance: She is obese. She is not toxic-appearing.     Comments: exam limited; patient had bowel movement at bedside commode during the encounter  HENT:     Head: Normocephalic and atraumatic.     Mouth/Throat:     Comments: edentulous  Eyes:     Extraocular Movements: Extraocular movements intact.  Cardiovascular:     Rate and Rhythm: Normal rate and regular rhythm.     Heart sounds: Normal heart sounds.  Pulmonary:     Effort: Pulmonary effort is normal.     Breath sounds: Normal breath sounds. No wheezing.  Abdominal:     General: Abdomen is protuberant. Bowel sounds are normal.     Palpations: Abdomen is soft. There is no mass.  Skin:    General: Skin is warm and dry.  Neurological:     Mental Status: She is alert and oriented to person, place, and time.     Assessment/Plan:  Active Problems:   BRBPR (bright red blood per rectum)   Aortic atherosclerosis (HCC)  Kendra Fries Brownis a57 y.o.woman with PMHx significant for obesity, asthma, allergic rhinitis, COPD, tobacco use, CAD, DMII, HTN, HLD, GERD, hypothyroidism who presented to the [place] ago with fever and body aches for about one week (maximum 103F) without new upper respiratory symptoms or sick contacts. Rectal bleeding, fecal urgency, fecal incontinence, abdominal pain, anorexia, dyspnea and lightheadedness on standinghas beenongoingfor about 2 months now.  1 Colitis Patient with historyofrectal bleeding in 2011 withactive colitis discovered on colonoscopy, presented witha newfever in the setting of chronic bloody diarrhea and abdominal pain that is most likelya recurrence of inflammatory bowel disease with intra abdominal abscess. 7/5 CT abdomen revealed colitis spanning from the ascending colon to the rectum, pericolonic edema, and a 39mlow densityfocus that is concerning for an intramural abscessor collection of diverticular fluid.GI pathogen panel is negative and blood cultures drawn 7/4 show no growth at three days. Patient is being followed by GI: updated management for steroid treatment initiated 7/7 and discharge with 5 week taper to decrease colonic inflammation, continue antibiotic course, and defer colonoscopy for 6-8 weeks due to concern for perforation with the procedure. Patient is doing well on antibiotics [day 4] and has responded to new steroid therapy. Patient continues to show clinical improvement.Today Mrs. BFrysingerendorses decreased hematochezia, decreased stool volume, soft formed stools, and states that  she is tolerating the soft diet well. Patient is stable for discharge home with plans for close follow up with [name]. Kendra Adams has a history of non insulin DM Type 2, and is at significant risk for steroid induced hyperglycemia [09/02/2018 A1C 7.2 increase from 02/2018 6.6]. Patient has been counseled about this risk and the importance of home monitoring. Care has been  established with [facility] for prevention/management of steroid induced hyperglycemia.     -outpatient GI follow up -discharge medications: prednisone taper, ciprofloxacin, metronidazole, zofran, bentyl  -glucose monitoring with [facility] -addition of basal insulin to glipizide and metformin home meds   2 Iron Deficiency Anemia Patient's anemia isacute onchronicepisode,most likely secondarytoher ongoing GI blood loss. Patient was complaining of dyspnea with exertion and lightheadedness on standing prior to her arrival.Patient reports resolution of these symptoms. Patient's hemoglobin has stabilized 7/8 7.6 with improvement of her hematochezia and 7/5 IV iron infusion. Patient did not require transfusion.     3 Hypokalemia/Hypomagnesemia  Patient's hypokalemia and hypomagnesemia are likely secondary to her chronic gastrointestinal fluid losses.  7/8 Mg level 1.5, repleted with 4g of IV magnesium this morning 7/8 K level 4, ongoing repletion     LOS: 3 days   Kendra Adams, Medical Student 09/15/2018, 9:45 AM

## 2018-09-15 NOTE — TOC Initial Note (Signed)
Transition of Care [facility]) - Initial/Assessment Note    Patient Details  Name: Kendra Adams MRN: [id number] Date of Birth: [birth year]  Transition of Care [facility]) CM/SW Contact:    Marilu Favre, RN Phone Number: 09/15/2018, 9:34 AM  Clinical Narrative:                 Spoke to patient via phone. Confirmed face sheet information. Patient has no insurance. Explained TOC and [place] patient wants to use TOC pharmacy and has $3 co pay per prescription. MD will send scripts to [facility]. Patient entered in [facility]   Expected Discharge Plan: Home/Self Care     Patient Goals and CMS Choice Patient states their goals for this hospitalization and ongoing recovery are:: to go home CMS Medicare.gov Compare Post Acute Care list provided to:: Patient Choice offered to / list presented to : NA  Expected Discharge Plan and Services Expected Discharge Plan: Home/Self Care In-house Referral: Financial Counselor Discharge Planning Services: CM Consult, [place] Program, Medication Assistance   Living arrangements for the past 2 months: Single Family Home                 DME Arranged: N/A           HH Agency: NA        Prior Living Arrangements/Services Living arrangements for the past 2 months: Single Family Home Lives with:: Spouse Patient language and need for interpreter reviewed:: Yes Do you feel safe going back to the place where you live?: Yes      Need for Family Participation in Patient Care: No (Comment) Care giver support system in place?: Yes (comment)   Criminal Activity/Legal Involvement Pertinent to Current Situation/Hospitalization: No - Comment as needed  Activities of Daily Living Home Assistive Devices/Equipment: CBG Meter ADL Screening (condition at time of admission) Patient's cognitive ability adequate to safely complete daily activities?: Yes Is the patient deaf or have difficulty hearing?: No Does the patient have difficulty seeing, even when wearing  glasses/contacts?: No Does the patient have difficulty concentrating, remembering, or making decisions?: No Patient able to express need for assistance with ADLs?: Yes Does the patient have difficulty dressing or bathing?: No Independently performs ADLs?: Yes (appropriate for developmental age) Does the patient have difficulty walking or climbing stairs?: No Weakness of Legs: None Weakness of Arms/Hands: Both  Permission Sought/Granted Permission sought to share information with : Case Manager Permission granted to share information with : Yes, Verbal Permission Granted              Emotional Assessment   Attitude/Demeanor/Rapport: Engaged Affect (typically observed): Accepting Orientation: : Oriented to Self, Oriented to Place, Oriented to  Time, Oriented to Situation Alcohol / Substance Use: Not Applicable Psych Involvement: No (comment)  Admission diagnosis:  Hypokalemia [E87.6] Fever in adult [R50.9] Gastrointestinal hemorrhage, unspecified gastrointestinal hemorrhage type [K92.2] Patient Active Problem List   Diagnosis Date Noted  . BRBPR (bright red blood per rectum) 09/12/2018  . Aortic atherosclerosis ([place]) 09/12/2018  . Rectal bleeding 08/17/2018  . Bilateral hip pain 05/27/2017  . Dyspareunia, female 04/06/2017  . Atopic dermatitis 01/01/2017  . History of restless legs syndrome 11/24/2016  . 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 Psoriasis 10/15/2015  . Status post laparoscopic cholecystectomy 08/12/2015  . Elevated transaminase level 08/12/2015  . Tendinitis of left rotator cuff 01/19/2015  . Tobacco use disorder 09/07/2014  . Morbid obesity ([place]) 03/19/2011  . Routine health maintenance 02/03/2011  . CAD (coronary artery  disease) 12/26/2010  . Diabetes mellitus type 2 with complications ([place]) 34/05/5246  . Hypertension associated with diabetes ([place]) 05/10/2008  . Hypothyroidism 04/27/2008  . HLD (hyperlipidemia) 04/27/2008  .  Depression 04/27/2008  . GERD 04/27/2008   PCP:  [name], MD Pharmacy:   [place], [address] [place] [address] Phone: [phone] Fax: [phone]     Social Determinants of Health (SDOH) Interventions    Readmission Risk Interventions No flowsheet data found.

## 2018-09-15 NOTE — Telephone Encounter (Signed)
Hospital TOC per Dr. Isac Sarna, discharge 09/15/2018, appt 09/22/2018.

## 2018-09-16 LAB — CULTURE, BLOOD (ROUTINE X 2)
Culture: NO GROWTH
Culture: NO GROWTH

## 2018-09-17 NOTE — Telephone Encounter (Signed)
Left voicemail for return call Debera Lat, RD 09/17/2018 1:51 PM.

## 2018-09-17 NOTE — Telephone Encounter (Signed)
Prevention of steroid induced hyperglycemia follow up:   Steroids: prednisone yesterday 40 mg, today 40 mg again Blood sugar: yesterday- 1x 745 am 126 Today 340 pm 182 Diabetes Medicine: metformin and glipizide, trying to stay within my means regarding my diet  Hypoglycemia symptoms: started getting hot a few hours ago, ate a snack and it went away Hyperglycemia symptoms: no Barely seeing any blood in stools.   Plan: no additional medication needed. She plans to send blood sugars via [employer], RD 09/17/2018 4:52 PM.

## 2018-09-17 NOTE — Telephone Encounter (Signed)
Left voicemail for return call Debera Lat, RD 09/17/2018 11:08 AM.

## 2018-09-17 NOTE — Telephone Encounter (Signed)
TOC call. Called pt - no answer; left message on vm of my call.

## 2018-09-20 NOTE — Telephone Encounter (Addendum)
Prevention of steroid induced hyperglycemia follow up:  Called- no answer left a message for return call. Called again and according to Ms Lezama:  Steroids:40 mg/day in am day 5 Blood sugar: highest over weekend 240 after eating Saturday night, 114 this am and  All others since last week have been under 120.  Diabetes Medicine:metformin 1000 mg twice daily and glipizide 10 mg daily No additional medicine needed at this time.  Hypoglycemia symptoms:no Hyperglycemia symptoms: no Doing great. Minimal blood. Semi-formed stool. Stomach not hurting.  Plan: call her on Thursday Donna Plyler, RD 09/20/2018 3:29 PM.

## 2018-09-20 NOTE — Telephone Encounter (Signed)
Good morning,   She was scheduled for 7/21 with Ermalinda Barrios. I called to see if she can come in on 7/15 and left her a vm. Awaiting a call back

## 2018-09-22 ENCOUNTER — Other Ambulatory Visit: Payer: Self-pay

## 2018-09-22 ENCOUNTER — Encounter: Payer: Self-pay | Admitting: Internal Medicine

## 2018-09-22 ENCOUNTER — Ambulatory Visit (INDEPENDENT_AMBULATORY_CARE_PROVIDER_SITE_OTHER): Payer: Self-pay | Admitting: Internal Medicine

## 2018-09-22 DIAGNOSIS — Z7984 Long term (current) use of oral hypoglycemic drugs: Secondary | ICD-10-CM

## 2018-09-22 DIAGNOSIS — K5010 Crohn's disease of large intestine without complications: Secondary | ICD-10-CM | POA: Insufficient documentation

## 2018-09-22 DIAGNOSIS — E118 Type 2 diabetes mellitus with unspecified complications: Secondary | ICD-10-CM

## 2018-09-22 DIAGNOSIS — D649 Anemia, unspecified: Secondary | ICD-10-CM

## 2018-09-22 DIAGNOSIS — K5190 Ulcerative colitis, unspecified, without complications: Secondary | ICD-10-CM | POA: Insufficient documentation

## 2018-09-22 DIAGNOSIS — K5090 Crohn's disease, unspecified, without complications: Secondary | ICD-10-CM | POA: Insufficient documentation

## 2018-09-22 DIAGNOSIS — D62 Acute posthemorrhagic anemia: Secondary | ICD-10-CM

## 2018-09-22 DIAGNOSIS — K529 Noninfective gastroenteritis and colitis, unspecified: Secondary | ICD-10-CM

## 2018-09-22 DIAGNOSIS — Z7952 Long term (current) use of systemic steroids: Secondary | ICD-10-CM

## 2018-09-22 NOTE — Progress Notes (Signed)
   CC: colitis  HPI:  [name] is a 58 y.o. female who presents to the clinic today for hospital follow-up.  Hospitalized July 4 through July 8 for worsening 33-monthhistory of symptoms consistent with acute hemorrhagic colitis with subsequent anemia.  Abdominal CT performed during hospitalization revealed colitis spanning from the ascending colon to the rectum, and a low-density focus is about 14 mm diameter that could be potentially consistent with an abscess or a collection diverticular fluid..  Leukocytosis was present on admission however, infectious work-up during hospitalization was negative and leukocytosis was resolved prior to discharge..  GI was consulted during the hospitalization stay and recommended a course of antibiotics, steroids, and colonoscopy to be done in 6 to 8 weeks following hospital discharge. Likely due to the hemorrhagic process of the colitis, hemoglobin is trending down as well.  11--> 9.5--> 7.6.  Hemoglobin remained stable at 7.6 throughout the remainder of hospitalization.  [name].  She notes much less blood loss in her stools.  She also notes decreased frequency of the stools.  Decreased pain.  Improved stool consistency, is now soft in comparison to liquid prior to hospitalization.  She is continuing to take her steroids and antibiotics along with her regular home medications.  She does report some nausea which was worse in the mornings and right after she eats.  Of note she does take most of her pills in the morning.  She is currently taking antiemetics occasionally which she notes works well.  Past Medical History:  Diagnosis Date  . Allergic rhinitis   . Asthma   . CAD (coronary artery disease)    a. 12/13/10: mRCA 99%, EF 65-70%;  s/p BMS to mRCA  . COPD (chronic obstructive pulmonary disease) ([place]   . Depression   . DM2 (diabetes mellitus, type 2) ([place]   . GERD (gastroesophageal  reflux disease)   . HTN (hypertension)   . Hyperlipidemia   . Hyperlipidemia   . Hypothyroidism   . Seasonal allergies   . Tobacco abuse    Review of Systems:  negative other than those stated in HPI  Physical Exam:  Vitals:   09/22/18 0936  BP: 129/83  Pulse: 98  Temp: 99.5 F (37.5 C)  SpO2: 97%  Weight: 255 lb 4.8 oz (115.8 kg)  Height: 5' 3"  (1.6 m)    GENERAL: well appearing, in no apparent distress HEENT: no conjunctival injection. Nares patent.  CARDIAC: heart regular rate and rhythm PULMONARY: lung sounds clear to auscultation ABDOMEN: bowel sounds hyper active.  SKIN: no rash or lesion on limited exam NEURO: CN II-XII grossly intact   Assessment & Plan:   See Encounters Tab for problem based charting.  Pertinent labs & imaging results that were available during my care of the patient were reviewed by me and considered in my medical decision making  Patient is in agreement with the plan and endorses no further questions at this time.  Patient seen with Dr. EAngelia Mould RMitzi Hansen MD Internal Medicine Resident-[id number] 09/22/18

## 2018-09-22 NOTE — Assessment & Plan Note (Addendum)
Hospitalized July 4 through July 8 following symptoms worsening over the past 2 months consistent with acute hemorrhagic colitis.   GI recommendations following d/c: Colitis on CT scan with rectal anemia and post-hemorrhagic anemia-we will treat her with a Prednisone taper and do a colonoscopy in 6-8 weeks.  She endorses compliance with her medications.  Notes that symptoms have greatly improved since hospital discharge. Plan: Continue per GI recommendations.  We also did discuss the FODMAPs diet briefly and I gave her some information on it.

## 2018-09-22 NOTE — Assessment & Plan Note (Signed)
Anemia likely to in part, hemorrhagic nature of the colitis along with the chronic disease aspect..  Hemoglobin 7.6 on hospital discharge and this had been stable for a few days prior to discharge.  In clinic today, the patient notes that she has had decreased blood loss and is feeling better from the fatigue standpoint.  I did not recheck her hemoglobin today however I did discuss with her that should she start bleeding more or start to experience some lightheadedness or fatigue then I would encourage her to return and have her hemoglobin checked at that time.  She agreed to this plan.

## 2018-09-22 NOTE — Patient Instructions (Signed)
Please follow up with cardiology and have the colonoscopy as previously discussed. Continue the steroid and antibiotic course as previously prescribed.  Low-FODMAP Eating Plan  FODMAPs (fermentable oligosaccharides, disaccharides, monosaccharides, and polyols) are sugars that are hard for some people to digest. A low-FODMAP eating plan may help some people who have bowel (intestinal) diseases to manage their symptoms. This meal plan can be complicated to follow. Work with a diet and nutrition specialist (dietitian) to make a low-FODMAP eating plan that is right for you. A dietitian can make sure that you get enough nutrition from this diet. What are tips for following this plan? Reading food labels  Check labels for hidden FODMAPs such as: ? High-fructose syrup. ? Honey. ? Agave. ? Natural fruit flavors. ? Onion or garlic powder.  Choose low-FODMAP foods that contain 3-4 grams of fiber per serving.  Check food labels for serving sizes. Eat only one serving at a time to make sure FODMAP levels stay low. Meal planning  Follow a low-FODMAP eating plan for up to 6 weeks, or as told by your health care provider or dietitian.  To follow the eating plan: 1. Eliminate high-FODMAP foods from your diet completely. 2. Gradually reintroduce high-FODMAP foods into your diet one at a time. Most people should wait a few days after introducing one high-FODMAP food before they introduce the next high-FODMAP food. Your dietitian can recommend how quickly you may reintroduce foods. 3. Keep a daily record of what you eat and drink, and make note of any symptoms that you have after eating. 4. Review your daily record with a dietitian regularly. Your dietitian can help you identify which foods you can eat and which foods you should avoid. General tips  Drink enough fluid each day to keep your urine pale yellow.  Avoid processed foods. These often have added sugar and may be high in FODMAPs.  Avoid most  dairy products, whole grains, and sweeteners.  Work with a dietitian to make sure you get enough fiber in your diet. Recommended foods Grains  Gluten-free grains, such as rice, oats, buckwheat, quinoa, corn, polenta, and millet. Gluten-free pasta, bread, or cereal. Rice noodles. Corn tortillas. Vegetables  Eggplant, zucchini, cucumber, peppers, green beans, Brussels sprouts, bean sprouts, lettuce, arugula, kale, Swiss chard, spinach, collard greens, bok choy, summer squash, potato, and tomato. Limited amounts of corn, carrot, and sweet potato. Green parts of scallions. Fruits  Bananas, oranges, lemons, limes, blueberries, raspberries, strawberries, grapes, cantaloupe, honeydew melon, kiwi, papaya, passion fruit, and pineapple. Limited amounts of dried cranberries, banana chips, and shredded coconut. Dairy  Lactose-free milk, yogurt, and kefir. Lactose-free cottage cheese and ice cream. Non-dairy milks, such as almond, coconut, hemp, and rice milk. Yogurts made of non-dairy milks. Limited amounts of goat cheese, brie, mozzarella, parmesan, swiss, and other hard cheeses. Meats and other protein foods  Unseasoned beef, pork, poultry, or fish. Eggs. Berniece Salines. Tofu (firm) and tempeh. Limited amounts of nuts and seeds, such as almonds, walnuts, [place] nuts, pecans, peanuts, pumpkin seeds, chia seeds, and sunflower seeds. Fats and oils  Butter-free spreads. Vegetable oils, such as olive, canola, and sunflower oil. Seasoning and other foods  Artificial sweeteners with names that do not end in "ol" such as aspartame, saccharine, and stevia. Maple syrup, white table sugar, raw sugar, Bisesi sugar, and molasses. Fresh basil, coriander, parsley, rosemary, and thyme. Beverages  Water and mineral water. Sugar-sweetened soft drinks. Small amounts of orange juice or cranberry juice. Black and green tea. Most dry wines. Coffee. This  may not be a complete list of low-FODMAP foods. Talk with your dietitian for  more information. Foods to avoid Grains  Wheat, including kamut, durum, and semolina. Barley and bulgur. Couscous. Wheat-based cereals. Wheat noodles, bread, crackers, and pastries. Vegetables  Chicory root, artichoke, asparagus, cabbage, snow peas, sugar snap peas, mushrooms, and cauliflower. Onions, garlic, leeks, and the white part of scallions. Fruits  Fresh, dried, and juiced forms of apple, pear, watermelon, peach, plum, cherries, apricots, blackberries, boysenberries, figs, nectarines, and mango. Avocado. Dairy  Milk, yogurt, ice cream, and soft cheese. Cream and sour cream. Milk-based sauces. Custard. Meats and other protein foods  Fried or fatty meat. Sausage. Cashews and pistachios. Soybeans, baked beans, black beans, chickpeas, kidney beans, fava beans, navy beans, lentils, and split peas. Seasoning and other foods  Any sugar-free gum or candy. Foods that contain artificial sweeteners such as sorbitol, mannitol, isomalt, or xylitol. Foods that contain honey, high-fructose corn syrup, or agave. Bouillon, vegetable stock, beef stock, and chicken stock. Garlic and onion powder. Condiments made with onion, such as hummus, chutney, pickles, relish, salad dressing, and salsa. Tomato paste. Beverages  Chicory-based drinks. Coffee substitutes. Chamomile tea. Fennel tea. Sweet or fortified wines such as port or sherry. Diet soft drinks made with isomalt, mannitol, maltitol, sorbitol, or xylitol. Apple, pear, and mango juice. Juices with high-fructose corn syrup. This may not be a complete list of high-FODMAP foods. Talk with your dietitian to discuss what dietary choices are best for you.  Summary  A low-FODMAP eating plan is a short-term diet that eliminates FODMAPs from your diet to help ease symptoms of certain bowel diseases.  The eating plan usually lasts up to 6 weeks. After that, high-FODMAP foods are restarted gradually, one at a time, so you can find out which may be causing  symptoms.  A low-FODMAP eating plan can be complicated. It is best to work with a dietitian who has experience with this type of plan. This information is not intended to replace advice given to you by your health care provider. Make sure you discuss any questions you have with your health care provider. Document Released: 10/21/2016 Document Revised: 02/06/2017 Document Reviewed: 10/21/2016 Elsevier Patient Education  2020 [employer].

## 2018-09-22 NOTE — Assessment & Plan Note (Signed)
She notes that her blood sugars have been a little bit higher than usual.  Discussed how prednisone and acute illness is likely contributing to this. Plan: For now, she will increase the glipizide to twice daily and she can discontinue this after steroid taper is complete.  Encouraged her to carefully check her blood sugars to make sure she does not start running too low.  She does seem to have a fairly decent insight into diabetes and agrees to watch her sugars. Also encouraged her to make an appointment after this acute illness is taking care of so we could discuss other diabetic medication options she is interested in trying to lose some weight and there are likely some other options out there that might help her with that along with her diabetes.

## 2018-09-23 NOTE — Telephone Encounter (Signed)
Pt was in [facility] yesterday by Dr Darrick Meigs.

## 2018-09-23 NOTE — Progress Notes (Signed)
Internal Medicine Clinic Attending  I saw and evaluated the patient.  I personally confirmed the key portions of the history and exam documented by Dr. Darrick Meigs and I reviewed pertinent patient test results.  The assessment, diagnosis, and plan were formulated together and I agree with the documentation in the resident's note.

## 2018-09-23 NOTE — Telephone Encounter (Signed)
Left voicemail for return call Kendra Adams, RD 09/23/2018 5:29 PM.

## 2018-09-24 NOTE — Telephone Encounter (Signed)
Prevention of steroid induced hyperglycemia follow up:   Steroids: 30 mg/day day 2 Blood sugar: not at home, does not have her meter with her(is at work)- her blood sugar was 133 this am after 1/2 cup coffee, highest was 270 one day when she was grazing all day. Not doing that today because she is at work and had symptoms of hypoglycemia a few minutes ago that she treated with ritz bits and does not have her meter so did not test.  Diabetes Medicine:Metformin and glipizide- forgot to take the extra glipizide that was recommended by the doctor at her visit this week. Plans to start that tonight.  Hypoglycemia symptoms- yes see above Hyperglycemia symptoms: no Plan: follow up next week. discussed importance of increased checks when starting extra dose of glipizide and timing of steroid affect blood sugars. Advised check prior to bedtime in addition to two other checks per day and snack of needed.  Kendra Adams, RD 09/24/2018 4:54 PM.

## 2018-09-27 ENCOUNTER — Telehealth: Payer: Self-pay | Admitting: Physician Assistant

## 2018-09-27 DIAGNOSIS — Z01818 Encounter for other preprocedural examination: Secondary | ICD-10-CM | POA: Insufficient documentation

## 2018-09-27 NOTE — Telephone Encounter (Signed)
New Message ° ° ° °Left message to confirm appt and answer covid questions  °

## 2018-09-27 NOTE — Progress Notes (Signed)
Cardiology Office Note    Date:  09/28/2018   ID:  Kendra Adams, DOB 07/03/1960, MRN [id number]  PCP:  [name], MD  Cardiologist: Ena Dawley, MD EPS: None  No chief complaint on file.   History of Present Illness:  Kendra Adams is a 58 y.o. female with history of CAD status post 2 BMS to the RCA 12/2010, HLD, tobacco abuse, DM type II, hypertension.  Repeat cardiac cath 08/14/2015 nonobstructive CAD with stent to the RCA widely patent with normal LV function.  I last saw the patient 05/2017 at which time she was having some chest and arm pain was still smoking blood sugars were running high.  NST 05/2017 was normal LVEF 57%.  Patient added onto my schedule today for cardiac clearance before undergoing colonoscopy by [name]. Was hospitalized 09/11/2018 through 09/15/2018 with acute hemorrhagic colitis treated with antibiotics and steroids and now in need for endoscopy hemoglobin 7.6 on 09/15/2018.   Patient denies chest pain. Getting strength back. Gets a little short of breath with activity.Works outside [employer] for a [employer]. Takes her time. Quit smoking July 4th!   Past Medical History:  Diagnosis Date  . Allergic rhinitis   . Asthma   . CAD (coronary artery disease)    a. 12/13/10: mRCA 99%, EF 65-70%;  s/p BMS to mRCA  . COPD (chronic obstructive pulmonary disease) ([place])   . Depression   . DM2 (diabetes mellitus, type 2) ([place])   . GERD (gastroesophageal reflux disease)   . HTN (hypertension)   . Hyperlipidemia   . Hyperlipidemia   . Hypothyroidism   . Seasonal allergies   . Tobacco abuse     Past Surgical History:  Procedure Laterality Date  . CARDIAC CATHETERIZATION N/A 08/14/2015   Procedure: Left Heart Cath and Coronary Angiography;  Surgeon: Peter M Martinique, MD;  Location: [facility];  Service: Cardiovascular;  Laterality: N/A;  . CARPAL TUNNEL RELEASE     w/ bone spurs  . CHOLECYSTECTOMY N/A 08/15/2015    Procedure: LAPAROSCOPIC CHOLECYSTECTOMY WITH ATTEMPTED INTRAOPERATIVE CHOLANGIOGRAM;  Surgeon: Erroll Luna, MD;  Location: [facility];  Service: General;  Laterality: N/A;  . CORONARY ANGIOPLASTY WITH STENT PLACEMENT    . DILATION AND CURETTAGE OF UTERUS      Current Medications: Current Meds  Medication Sig  . albuterol (PROVENTIL HFA;VENTOLIN HFA) 108 (90 Base) MCG/ACT inhaler Inhale 1-2 puffs into the lungs every 6 (six) hours as needed for wheezing or shortness of breath.  Marland Kitchen aspirin 81 MG chewable tablet Chew 81 mg by mouth every morning.   Marland Kitchen atorvastatin (LIPITOR) 80 MG tablet Take 1 tablet (80 mg total) by mouth daily.  . betamethasone dipropionate (DIPROLENE) 0.05 % cream Apply topically 2 (two) times daily. (Patient taking differently: Apply 1 application topically 2 (two) times daily as needed (skin care). )  . dicyclomine (BENTYL) 10 MG capsule Take 1 capsule (10 mg total) by mouth 3 (three) times daily before meals. IM Program  . fluticasone (FLONASE) 50 MCG/ACT nasal spray Place 2 sprays into both nostrils daily. (Patient taking differently: Place 2 sprays into both nostrils daily as needed for allergies. )  . glipiZIDE (GLUCOTROL) 10 MG tablet Take 1 tablet by mouth once daily  . glucose blood (CONTOUR TEST) test strip Use as instructed  . Lancets MISC 1 Units by Does not apply route daily.  Marland Kitchen levothyroxine (SYNTHROID) 150 MCG tablet TAKE 1 TABLET BY MOUTH ONCE DAILY BEFORE BREAKFAST (  Patient taking differently: Take 150 mcg by mouth daily before breakfast. )  . loratadine (CLARITIN) 10 MG tablet Take 1 tablet (10 mg total) by mouth daily.  Marland Kitchen losartan (COZAAR) 100 MG tablet Take 1 tablet (100 mg total) by mouth daily.  . metFORMIN (GLUCOPHAGE) 1000 MG tablet Take 1 tablet (1,000 mg total) by mouth 2 (two) times daily.  . metoprolol succinate (TOPROL-XL) 100 MG 24 hr tablet TAKE 1 TABLET BY MOUTH ONCE DAILY WITH  OR  IMMEDIATELY  FOLLOWING  A  MEAL (Patient taking differently: Take 100  mg by mouth daily. )  . nitroGLYCERIN (NITROSTAT) 0.4 MG SL tablet Place 1 tablet (0.4 mg total) under the tongue every 5 (five) minutes as needed for chest pain.  . Omega-3 Fatty Acids (FISH OIL) 1000 MG CAPS Take 1 capsule by mouth every morning.   Marland Kitchen omeprazole (PRILOSEC) 40 MG capsule TAKE 1 CAPSULE BY MOUTH ONCE DAILY (Patient taking differently: Take 40 mg by mouth daily. )  . ondansetron (ZOFRAN) 4 MG tablet Take 1 tablet (4 mg total) by mouth every 6 (six) hours as needed for nausea.  . predniSONE (DELTASONE) 10 MG tablet 4 pills daily 1st week, 3 pills daily 2nd week, 2 pills daily 3rd week, 1 pill daily 4th week, 1 half pill daily 5th week.  Marland Kitchen rOPINIRole (REQUIP) 1 MG tablet Take 1 tablet (1 mg total) by mouth at bedtime.  . sertraline (ZOLOFT) 100 MG tablet Take 1.5 tablets (150 mg total) by mouth daily.  . simethicone (MYLICON) 80 MG chewable tablet Chew 1 tablet (80 mg total) by mouth every 6 (six) hours as needed for flatulence (Bloating).  . Spacer/Aero-Holding Chambers (AEROCHAMBER PLUS) inhaler Use as instructed  . triamcinolone cream (KENALOG) 0.1 % Apply 1 application topically 2 (two) times daily. (Patient taking differently: Apply 1 application topically 2 (two) times daily as needed (skin care). )  . [DISCONTINUED] nitroGLYCERIN (NITROSTAT) 0.4 MG SL tablet Place 1 tablet (0.4 mg total) under the tongue every 5 (five) minutes as needed for chest pain.     Allergies:   Gabapentin, Nsaids, Opium, and Tramadol hcl   Social History   Socioeconomic History  . Marital status: Married    Spouse name: Not on file  . Number of children: 0  . Years of education: 10th grade  . Highest education level: Not on file  Occupational History  . Occupation: Conservation officer, nature    Comment: self-employed  . 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 Financial resource strain: Not on file  . Food insecurity    Worry: Not on file    Inability: Not on file  . Transportation  needs    Medical: Not on file    Non-medical: Not on file  Tobacco Use  . Smoking status: Former Smoker    Packs/day: 0.40    Years: 40.00    Pack years: 16.00    Types: Cigarettes    Quit date: 05/19/2014    Years since quitting: 4.3  . Smokeless tobacco: Never Used  . Tobacco comment: Quit July 4th.  Substance and Sexual Activity  . Alcohol use: No    Alcohol/week: 0.0 standard drinks  . Drug use: Yes    Frequency: 7.0 times per week    Types: Marijuana    Comment: Marijuana Use  . Sexual activity: Yes  Lifestyle  . Physical activity    Days per week: 0 days    Minutes  per session: 0 min  . Stress: Not at all  Relationships  . Social connections    Talks on phone: More than three times a week    Gets together: Once a week    Attends religious service: Never    Active member of club or organization: No    Attends meetings of clubs or organizations: Never    Relationship status: Married  Other Topics Concern  . Not on file  Social History Narrative   Father died of suicide (gunshot) in 66. 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       Patient lives with her husband, her husband's niece Tammy who is mentally retarded and her best friend and the friend's daughter and 6 dogs                 Family History:  The patient's   family history includes Diabetes in her mother; Hypertension in her mother.   ROS:   Please see the history of present illness.    ROS All other systems reviewed and are negative.   PHYSICAL EXAM:   VS:  BP 120/70   Pulse (!) 102   Ht 5' 3"  (1.6 m)   Wt 247 lb 12.8 oz (112.4 kg)   SpO2 96%   BMI 43.90 kg/m   Physical Exam  HAL:PFXTK, in no acute distress   Neck: no JVD, carotid bruits, or masses Cardiac:RRR; no murmurs, rubs, or gallops  Respiratory:  clear to auscultation bilaterally, normal work of breathing GI: soft, nontender,  nondistended, + BS Ext: without cyanosis, clubbing, or edema, Good distal pulses bilaterally MS: no deformity or atrophy  Skin: warm and dry, no rash Neuro:  Alert and Oriented x 3 Psych: euthymic mood, full affect  Wt Readings from Last 3 Encounters:  09/28/18 247 lb 12.8 oz (112.4 kg)  09/22/18 255 lb 4.8 oz (115.8 kg)  09/12/18 249 lb 1.9 oz (113 kg)      Studies/Labs Reviewed:   EKG:  EKG is  ordered today.  The ekg ordered today demonstrates sinus tachycardia at 102/m with nonspecific ST changes  Recent Labs: 09/02/2018: TSH 5.516 09/11/2018: ALT 16 09/15/2018: BUN 6; Creatinine, Ser 0.80; Hemoglobin 7.6; Magnesium 1.5; Platelets 265; Potassium 4.4; Sodium 141   Lipid Panel    Component Value Date/Time   CHOL 114 09/02/2018 1350   CHOL 123 05/25/2017 0800   TRIG 93 09/02/2018 1350   HDL 32 (L) 09/02/2018 1350   HDL 31 (L) 05/25/2017 0800   CHOLHDL 3.6 09/02/2018 1350   VLDL 19 09/02/2018 1350   LDLCALC 63 09/02/2018 1350   LDLCALC 59 05/25/2017 0800   LDLDIRECT 142.4 03/21/2011 0843    Additional studies/ records that were reviewed today include:   NST 05/2017  Nuclear stress EF: 57%. The left ventricular ejection fraction is normal (55-65%). The mid anterior and anterolateral wall contracts normally.  Defect 1: There is a small fixed defect of mild severity present in the mid anterior and mid anterolateral location. This appears to be most consistent with breast artifact.  The study is normal.  This is a low risk study.   Cardiac cath 08/2015   Conclusion     Ost Cx to Prox Cx lesion, 10% stenosed.  The left ventricular systolic function is normal.   1. Nonobstructive CAD. The stent in the RCA is widely patent. 2. Normal  LV function   Plan: she is clear to proceed with cholecystectomy from a cardiac standpoint.       Conclusion     Ost Cx to Prox Cx lesion, 10% stenosed.  The left ventricular systolic function is normal.   1. Nonobstructive CAD.  The stent in the RCA is widely patent. 2. Normal LV function   Plan: she is clear to proceed with cholecystectomy from a cardiac standpoint.       FINAL ASSESSMENT:  Successful stenting of the mid right coronary artery with a bare-metal stent.   PLAN:  Continue on aspirin and Plavix.             ______________________________ Peter M. Martinique, M.D.         PMJ/MEDQ  D:  12/13/2010  T:  12/14/2010  Job:   Nuclear stress test 10/2013   Impression Exercise Capacity:  Poor exercise capacity. BP Response:  Normal blood pressure response. Patient was hypotensive during Stage 1, then became hypertensive. Clinical Symptoms:  Significant dyspnea ECG Impression:  No significant ST segment change suggestive of ischemia. Comparison with Prior Nuclear Study: No previous nuclear study performed   Overall Impression:  Low risk stress nuclear study with fixed inferior, apical and anterior attenuation artifacts despite 2 day imaging. Poor exercise capacity..   LV Ejection Fraction: 53%.  LV Wall Motion:  NL LV Function; NL Wall Motion           ASSESSMENT:    1. Preoperative clearance   2. Coronary artery disease due to lipid rich plaque   3. Hypertension associated with diabetes ([place])   4. Hyperlipidemia, unspecified hyperlipidemia type   5. Diabetes mellitus type 2 with complications ([place])   6. Tobacco use disorder   7. Coronary artery disease involving native coronary artery of native heart without angina pectoris      PLAN:  In order of problems listed above:  Preoperative clearance before undergoing colonoscopy by [name].  Was hospitalized 09/11/2018 through 09/15/2018 with acute hemorrhagic colitis treated with antibiotics and steroids and now in need for endoscopy hemoglobin 7.6 on 09/15/2018. Patient denies chest pain but chronic dyspnea on exertion. Anemia was a stress test in itself. NST 05/2017 normal with normal LVEF 57%. Mets is 6/27.  RCRI 6.6 because of CAD and history of  CVA.  Can proceed with colonoscopy without further cardiac workup. According to the Revised Cardiac Risk Index (RCRI), her Perioperative Risk of Major Cardiac Event is (%): 6.6  Her Functional Capacity in METs is: 6.27 according to the Duke Activity Status Index (DASI)    CAD status post BMS x2 to the RCA in 2012, cardiac cath 2017 patent stents nonobstructive disease with normal LVEF, normal NST 05/2017. F/u with Dr. Meda Coffee in 1 yr. Refill NTG.  Essential hypertension BP good   Hyperlipidemia LDL 63 09/02/2018 on Lipitor  DM type II hemoglobin A1c 7.2 09/02/2018  Tobacco abuse-quit 09/11/18  Morbid obesity exercise and weight loss recommended.    Medication Adjustments/Labs and Tests Ordered: Current medicines are reviewed at length with the patient today.  Concerns regarding medicines are outlined above.  Medication changes, Labs and Tests ordered today are listed in the Patient Instructions below. There are no Patient Instructions on file for this visit.   Sumner Boast, PA-C  09/28/2018 11:32 AM    [facility] [address], [place], [place]  [address] Phone: [phone]; Fax: [phone]

## 2018-09-28 ENCOUNTER — Other Ambulatory Visit: Payer: Self-pay

## 2018-09-28 ENCOUNTER — Ambulatory Visit (INDEPENDENT_AMBULATORY_CARE_PROVIDER_SITE_OTHER): Payer: Self-pay | Admitting: Physician Assistant

## 2018-09-28 ENCOUNTER — Encounter: Payer: Self-pay | Admitting: Physician Assistant

## 2018-09-28 VITALS — BP 120/70 | HR 102 | Ht 63.0 in | Wt 247.8 lb

## 2018-09-28 DIAGNOSIS — I10 Essential (primary) hypertension: Secondary | ICD-10-CM

## 2018-09-28 DIAGNOSIS — I2510 Atherosclerotic heart disease of native coronary artery without angina pectoris: Secondary | ICD-10-CM

## 2018-09-28 DIAGNOSIS — F17200 Nicotine dependence, unspecified, uncomplicated: Secondary | ICD-10-CM

## 2018-09-28 DIAGNOSIS — E1159 Type 2 diabetes mellitus with other circulatory complications: Secondary | ICD-10-CM

## 2018-09-28 DIAGNOSIS — E118 Type 2 diabetes mellitus with unspecified complications: Secondary | ICD-10-CM

## 2018-09-28 DIAGNOSIS — I2583 Coronary atherosclerosis due to lipid rich plaque: Secondary | ICD-10-CM

## 2018-09-28 DIAGNOSIS — E785 Hyperlipidemia, unspecified: Secondary | ICD-10-CM

## 2018-09-28 DIAGNOSIS — Z01818 Encounter for other preprocedural examination: Secondary | ICD-10-CM

## 2018-09-28 DIAGNOSIS — I152 Hypertension secondary to endocrine disorders: Secondary | ICD-10-CM

## 2018-09-28 MED ORDER — NITROGLYCERIN 0.4 MG SL SUBL: 0.4000 mg | SUBLINGUAL_TABLET | SUBLINGUAL | 3 refills | Status: DC | PRN

## 2018-09-28 NOTE — Patient Instructions (Signed)
Medication Instructions:  Your physician recommends that you continue on your current medications as directed. Please refer to the Current Medication list given to you today.  If you need a refill on your cardiac medications before your next appointment, please call your pharmacy.   Lab work: None ordered  If you have labs (blood work) drawn today and your tests are completely normal, you will receive your results only by: Marland Kitchen MyChart Message (if you have MyChart) OR . A paper copy in the mail If you have any lab test that is abnormal or we need to change your treatment, we will call you to review the results.  Testing/Procedures: None ordered  Follow-Up: At [facility], you and your health needs are our priority.  As part of our continuing mission to provide you with exceptional heart care, we have created designated Provider Care Teams.  These Care Teams include your primary Cardiologist (physician) and Advanced Practice Providers (APPs -  Physician Assistants and Nurse Practitioners) who all work together to provide you with the care you need, when you need it. You will need a follow up appointment in 12 months.  Please call our office 2 months in advance to schedule this appointment.  You may see Ena Dawley, MD or one of the following Advanced Practice Providers on your designated Care Team:   [place], PA-C Melina Copa, PA-C . Ermalinda Barrios, PA-C  Any Other Special Instructions Will Be Listed Below (If Applicable). Exercise and weight loss is recommended  You will be cleared for the upcoming prcedure. We will send the surgeon's office a notification

## 2018-10-04 ENCOUNTER — Encounter: Payer: Self-pay | Admitting: Internal Medicine

## 2018-10-14 ENCOUNTER — Encounter: Payer: Self-pay | Admitting: Internal Medicine

## 2018-10-18 ENCOUNTER — Ambulatory Visit: Payer: Self-pay | Admitting: Internal Medicine

## 2018-10-18 ENCOUNTER — Encounter: Payer: Self-pay | Admitting: Internal Medicine

## 2018-10-18 ENCOUNTER — Other Ambulatory Visit: Payer: Self-pay

## 2018-10-18 VITALS — BP 121/76 | HR 115 | Temp 99.5°F | Ht 63.0 in | Wt 240.2 lb

## 2018-10-18 DIAGNOSIS — D62 Acute posthemorrhagic anemia: Secondary | ICD-10-CM

## 2018-10-18 DIAGNOSIS — D500 Iron deficiency anemia secondary to blood loss (chronic): Secondary | ICD-10-CM

## 2018-10-18 DIAGNOSIS — K529 Noninfective gastroenteritis and colitis, unspecified: Secondary | ICD-10-CM

## 2018-10-18 MED ORDER — FERROUS SULFATE 325 (65 FE) MG PO TBEC: 325.0000 mg | DELAYED_RELEASE_TABLET | Freq: Every day | ORAL | 11 refills | Status: DC

## 2018-10-18 MED ORDER — ONDANSETRON HCL 4 MG PO TABS: 4.0000 mg | ORAL_TABLET | Freq: Four times a day (QID) | ORAL | 0 refills | Status: DC | PRN

## 2018-10-18 MED FILL — ONDANSETRON HCL 4 MG TABLET: 4 | 20 days supply | Qty: 20 | Fill #0

## 2018-10-18 NOTE — Progress Notes (Signed)
   CC: Left lower quadrant pain  HPI:  [name] is a 58 y.o. very pleasant woman with medical history listed below presenting for evaluation of left lower quadrant pain.  Please see problem based charting for further details.   Past Medical History:  Diagnosis Date  . Allergic rhinitis   . Asthma   . CAD (coronary artery disease)    a. 12/13/10: mRCA 99%, EF 65-70%;  s/p BMS to mRCA  . COPD (chronic obstructive pulmonary disease) ([place])   . Depression   . DM2 (diabetes mellitus, type 2) ([place])   . GERD (gastroesophageal reflux disease)   . HTN (hypertension)   . Hyperlipidemia   . Hyperlipidemia   . Hypothyroidism   . Seasonal allergies   . Tobacco abuse    Review of Systems:  As per HPI  Physical Exam:  Vitals:   10/18/18 1029  BP: 121/76  Pulse: (!) 115  Temp: 99.5 F (37.5 C)  TempSrc: Oral  SpO2: 98%  Weight: 240 lb 3.2 oz (109 kg)  Height: 5' 3"  (1.6 m)   Physical Exam  Constitutional: She is well-developed, well-nourished, and in no distress.  HENT:  Head: Normocephalic and atraumatic.  Eyes: Conjunctivae are normal.  Cardiovascular: Normal rate, regular rhythm and normal heart sounds. Exam reveals no gallop.  No murmur heard. Abdominal: Soft. Bowel sounds are normal. Distention: Due to body habitus. There is abdominal tenderness (Deep palpation of the left lower extremity). There is no rebound and no guarding.  Skin:       Assessment & Plan:   See Encounters Tab for problem based charting.  Patient discussed with Dr. Rebeca Alert

## 2018-10-18 NOTE — Assessment & Plan Note (Signed)
Left lower quadrant pain: [name] was recently admitted to the hospital from September 11, 2018 to September 15, 2018 when she began experiencing diarrhea and rectal bleeding.  She was found to have colitis spanning from the ascending colon to the rectum as well as small intramural abscess of the sigmoid colon.  She initially received IV ciprofloxacin, Flagyl and IV Solu-Medrol.  She was subsequently discharged on oral antibiotics and a steroid taper.  Since discharge, she continues to have intermittent left lower quadrant pain, diarrhea (about 5 times a day which happens almost every hour), and intermittent hematochezia.  She also reports of nausea, decreased appetite, decreased p.o. intake and dizziness on upstanding.  She was asked to follow-up with [name] (gastroenterology) for colonoscopy 4 to 6 weeks after being discharged from the hospital.  Per chart review, she was suspected to have Crohn's disease on prior colonoscopy which showed a Crohn's pattern however has not been definitively diagnosed.  On physical exams, bowel sounds are present and she only has tenderness to deep palpation of the left lower extremity.  She appears hemodynamically stable and orthostatic vitals are reassuring.  Line: 114/69, pulse 60 Sitting: 96/83, pulse 115 Standing: 96/64, pulse 119 Standing at 3 minutes: 112/80, pulse 92  Plan: -Advised to follow-up with gastroenterology to discuss initiating therapy for the high suspicion of inflammatory bowel disease  -Advised to continue keeping up with oral intake

## 2018-10-18 NOTE — Patient Instructions (Signed)
[name],  It was a pleasure taking care of your the clinic today.  Overall, he seems to be doing pretty well.  I am still worried about the chronic diarrhea you have been having and for my chart review I saw that you could have possible Crohn's disease.  I would like for you to see [name] as soon as possible to start therapy.  I am also going to check blood work today to make sure your hemoglobin has not dropped.  Take Care! Dr. Eileen Stanford

## 2018-10-18 NOTE — Assessment & Plan Note (Signed)
Hx of Iron deficiency anemia due to GI loss: During her recent admission, she did have a drop in hemoglobin.  Her baseline is 10.2 however globin trended down to 7.6 and remained stable.  She did receive IV iron infusion when she was discovered to have ferritin of 20, iron saturation of 15 in June of this year.  She only takes over-the-counter iron supplements (gentle iron).  Plan: -Follow-up CBC -Advised to discontinue OTC iron supplements and start ferrous sulfate 325 mg once daily

## 2018-10-19 ENCOUNTER — Telehealth: Payer: Self-pay | Admitting: Internal Medicine

## 2018-10-19 ENCOUNTER — Other Ambulatory Visit: Payer: Self-pay | Admitting: Internal Medicine

## 2018-10-19 LAB — CBC
Hematocrit: 30.4 % — ABNORMAL LOW (ref 34.0–46.6)
Hemoglobin: 9.8 g/dL — ABNORMAL LOW (ref 11.1–15.9)
MCH: 27.9 pg (ref 26.6–33.0)
MCHC: 32.2 g/dL (ref 31.5–35.7)
MCV: 87 fL (ref 79–97)
Platelets: 311 10*3/uL (ref 150–450)
RBC: 3.51 x10E6/uL — ABNORMAL LOW (ref 3.77–5.28)
RDW: 15.3 % (ref 11.7–15.4)
WBC: 11.2 10*3/uL — ABNORMAL HIGH (ref 3.4–10.8)

## 2018-10-19 NOTE — Progress Notes (Signed)
Internal Medicine Clinic Attending  Case discussed with Dr. Agyei at the time of the visit.  We reviewed the resident's history and exam and pertinent patient test results.  I agree with the assessment, diagnosis, and plan of care documented in the resident's note.  Alexander Raines, M.D., Ph.D.  

## 2018-10-19 NOTE — Telephone Encounter (Signed)
I called [name] to discuss her CBC.  Unfortunately I was unable to reach her but I did leave a voicemail with her results and also encouraged her to follow-up with [name] (gastroenterology).

## 2018-10-20 ENCOUNTER — Other Ambulatory Visit: Payer: Self-pay | Admitting: Gastroenterology

## 2018-10-25 ENCOUNTER — Other Ambulatory Visit (HOSPITAL_COMMUNITY)
Admission: RE | Admit: 2018-10-25 | Discharge: 2018-10-25 | Disposition: A | Discharge status: Home / Self Care | Payer: HRSA Program | Source: Ambulatory Visit | Attending: Gastroenterology | Admitting: Gastroenterology

## 2018-10-25 DIAGNOSIS — Z20828 Contact with and (suspected) exposure to other viral communicable diseases: Secondary | ICD-10-CM | POA: Diagnosis not present

## 2018-10-25 DIAGNOSIS — Z01812 Encounter for preprocedural laboratory examination: Secondary | ICD-10-CM | POA: Diagnosis present

## 2018-10-25 DIAGNOSIS — K529 Noninfective gastroenteritis and colitis, unspecified: Secondary | ICD-10-CM | POA: Diagnosis not present

## 2018-10-25 LAB — SARS CORONAVIRUS 2 (TAT 6-24 HRS): SARS Coronavirus 2: NEGATIVE

## 2018-10-25 MED ORDER — LOSARTAN POTASSIUM 50 MG PO TABS: 50.0000 mg | ORAL_TABLET | Freq: Every day | ORAL | 1 refills | Status: DC

## 2018-10-25 NOTE — Telephone Encounter (Signed)
I would continue toprol XL 100 mg po daily in the mornings nd decrease losartan to 50 mg po daily to be taken at night. Please send [place] some BP measurements after you do this regimen for few days.  ----- Message -----  From: Nuala Alpha, LPN  Sent: 7/90/0920  8:28 AM EDT  To: Kendra Spark, MD  Subject: FW: Non-Urgent Medical Question       Sent the pt a mychart message with new recommendations and med changes, per Dr Meda Coffee, as indicated above in this message.  Sent the new decreased dose of Losartan 50 mg po daily to the pts pharmacy to be filled.  Advised the pt that per Dr Meda Coffee, she would like for her to send [place] some BP readings via her mychart in a few days, to let us know how her measurements are on the new regimen.  Advised the pt to send [place] a reply message informing she received new instructions.

## 2018-10-26 NOTE — Telephone Encounter (Signed)
Pt reviewed mychart message sent by Dr. Meda Coffee and myself, with med adjustments and recommendations, yesterday 8/17 at 12:56 pm.

## 2018-10-27 NOTE — Progress Notes (Signed)
LM on VM for patient to return call by 530 pm  to go over instructions for procedure tomorrow 10/28/2018. Patient left instructions to arrive at the Admitting Department at [facility] at [place] ,  Have nothing to drink after midnight and to follow bowel prep instructions from [name]. LM for patient to take these medications tomorrow morning with a sip of water-Levothyroxine, Metoprolol succinate, Sertraline (Zoloft), Omeprazole (Prilosec), use Aero chamber plus  inhaler if needed and use Albuterol inhaler if needed and bring inhalers with you to the hospital.

## 2018-10-28 ENCOUNTER — Ambulatory Visit (HOSPITAL_COMMUNITY)
Admission: RE | Admit: 2018-10-28 | Discharge: 2018-10-28 | Disposition: A | Discharge status: Home / Self Care | Payer: Self-pay | Attending: Gastroenterology | Admitting: Gastroenterology

## 2018-10-28 ENCOUNTER — Ambulatory Visit (HOSPITAL_COMMUNITY): Payer: Self-pay | Admitting: Anesthesiology

## 2018-10-28 ENCOUNTER — Encounter (HOSPITAL_COMMUNITY): Payer: Self-pay | Admitting: *Deleted

## 2018-10-28 ENCOUNTER — Other Ambulatory Visit: Payer: Self-pay

## 2018-10-28 ENCOUNTER — Encounter (HOSPITAL_COMMUNITY)
Admission: RE | Disposition: A | Discharge status: Home / Self Care | Payer: Self-pay | Source: Home / Self Care | Attending: Gastroenterology

## 2018-10-28 DIAGNOSIS — Z7982 Long term (current) use of aspirin: Secondary | ICD-10-CM | POA: Insufficient documentation

## 2018-10-28 DIAGNOSIS — J45909 Unspecified asthma, uncomplicated: Secondary | ICD-10-CM | POA: Insufficient documentation

## 2018-10-28 DIAGNOSIS — Z6841 Body Mass Index (BMI) 40.0 and over, adult: Secondary | ICD-10-CM | POA: Insufficient documentation

## 2018-10-28 DIAGNOSIS — K219 Gastro-esophageal reflux disease without esophagitis: Secondary | ICD-10-CM | POA: Insufficient documentation

## 2018-10-28 DIAGNOSIS — E785 Hyperlipidemia, unspecified: Secondary | ICD-10-CM | POA: Insufficient documentation

## 2018-10-28 DIAGNOSIS — Z79890 Hormone replacement therapy (postmenopausal): Secondary | ICD-10-CM | POA: Insufficient documentation

## 2018-10-28 DIAGNOSIS — Z955 Presence of coronary angioplasty implant and graft: Secondary | ICD-10-CM | POA: Insufficient documentation

## 2018-10-28 DIAGNOSIS — J449 Chronic obstructive pulmonary disease, unspecified: Secondary | ICD-10-CM | POA: Insufficient documentation

## 2018-10-28 DIAGNOSIS — Z7984 Long term (current) use of oral hypoglycemic drugs: Secondary | ICD-10-CM | POA: Insufficient documentation

## 2018-10-28 DIAGNOSIS — Z79899 Other long term (current) drug therapy: Secondary | ICD-10-CM | POA: Insufficient documentation

## 2018-10-28 DIAGNOSIS — Z87891 Personal history of nicotine dependence: Secondary | ICD-10-CM | POA: Insufficient documentation

## 2018-10-28 DIAGNOSIS — R194 Change in bowel habit: Secondary | ICD-10-CM | POA: Insufficient documentation

## 2018-10-28 DIAGNOSIS — F329 Major depressive disorder, single episode, unspecified: Secondary | ICD-10-CM | POA: Insufficient documentation

## 2018-10-28 DIAGNOSIS — Z7951 Long term (current) use of inhaled steroids: Secondary | ICD-10-CM | POA: Insufficient documentation

## 2018-10-28 DIAGNOSIS — D649 Anemia, unspecified: Secondary | ICD-10-CM | POA: Insufficient documentation

## 2018-10-28 DIAGNOSIS — K529 Noninfective gastroenteritis and colitis, unspecified: Secondary | ICD-10-CM | POA: Insufficient documentation

## 2018-10-28 DIAGNOSIS — K625 Hemorrhage of anus and rectum: Secondary | ICD-10-CM | POA: Insufficient documentation

## 2018-10-28 DIAGNOSIS — E119 Type 2 diabetes mellitus without complications: Secondary | ICD-10-CM | POA: Insufficient documentation

## 2018-10-28 DIAGNOSIS — I2510 Atherosclerotic heart disease of native coronary artery without angina pectoris: Secondary | ICD-10-CM | POA: Insufficient documentation

## 2018-10-28 DIAGNOSIS — I10 Essential (primary) hypertension: Secondary | ICD-10-CM | POA: Insufficient documentation

## 2018-10-28 DIAGNOSIS — E039 Hypothyroidism, unspecified: Secondary | ICD-10-CM | POA: Insufficient documentation

## 2018-10-28 DIAGNOSIS — R933 Abnormal findings on diagnostic imaging of other parts of digestive tract: Secondary | ICD-10-CM | POA: Insufficient documentation

## 2018-10-28 HISTORY — PX: COLONOSCOPY WITH PROPOFOL: SHX5780

## 2018-10-28 HISTORY — PX: BIOPSY: SHX5522

## 2018-10-28 LAB — GLUCOSE, CAPILLARY: Glucose-Capillary: 114 mg/dL — ABNORMAL HIGH (ref 70–99)

## 2018-10-28 SURGERY — COLONOSCOPY WITH PROPOFOL
Anesthesia: Monitor Anesthesia Care

## 2018-10-28 MED ORDER — PROPOFOL 10 MG/ML IV BOLUS
INTRAVENOUS | Status: DC | PRN
  Administered 2018-10-28 (×2): 10 mg via INTRAVENOUS
  Administered 2018-10-28: 20 mg via INTRAVENOUS
  Administered 2018-10-28: 10 mg via INTRAVENOUS
  Administered 2018-10-28: 20 mg via INTRAVENOUS

## 2018-10-28 MED ORDER — SODIUM CHLORIDE 0.9 % IV SOLN: INTRAVENOUS | Status: DC

## 2018-10-28 MED ORDER — PROPOFOL 500 MG/50ML IV EMUL
INTRAVENOUS | Status: DC | PRN
  Administered 2018-10-28: 100 ug/kg/min via INTRAVENOUS

## 2018-10-28 MED ORDER — PROPOFOL 10 MG/ML IV BOLUS
INTRAVENOUS | Status: AC
  Filled 2018-10-28: qty 20

## 2018-10-28 MED ORDER — LACTATED RINGERS IV SOLN
INTRAVENOUS | Status: DC
  Administered 2018-10-28: 1000 mL via INTRAVENOUS

## 2018-10-28 MED ORDER — PROPOFOL 10 MG/ML IV BOLUS
INTRAVENOUS | Status: AC
  Filled 2018-10-28: qty 40

## 2018-10-28 MED ORDER — PHENYLEPHRINE 40 MCG/ML (10ML) SYRINGE FOR IV PUSH (FOR BLOOD PRESSURE SUPPORT)
PREFILLED_SYRINGE | INTRAVENOUS | Status: DC | PRN
  Administered 2018-10-28: 40 ug via INTRAVENOUS

## 2018-10-28 SURGICAL SUPPLY — 22 items

## 2018-10-28 NOTE — Op Note (Addendum)
[facility] Patient Name: Kendra Adams Procedure Date: 10/28/2018 MRN: [id number] Attending MD: Juanita Craver , MD Date of Birth: [birth year] CSN: [id number] Age: 58 Admit Type: Outpatient Procedure:                Colonoscopy with multiple biopsies. Indications:              Rectal bleeding, Change in bowel habits; Abnormal                            CT scan of the abdomen, CRC screening for                            colorectal malignant neoplasm Providers:                Juanita Craver, MD, Cleda Daub, RN, Cherylynn Ridges,                            Technician, Cortland Alday CRNA Referring MD:             Bartholomew Crews MD Medicines:                Monitored Anesthesia Care Complications:            No immediate complications. Estimated Blood Loss:     Estimated blood loss was minimal. Procedure:                Pre-Anesthesia Assessment:- Prior to the procedure,                            a history and physical was performed, and patient                            medications and allergies were reviewed. The                            patient's tolerance of previous anesthesia was also                            reviewed. The risks and benefits of the procedure                            and the sedation options and risks were discussed                            with the patient. All questions were answered, and                            informed consent was obtained. Prior                            Anticoagulants: The patient has taken no previous                            anticoagulant or antiplatelet agents except for  aspirin. ASA Grade Assessment: III - A patient with                            severe systemic disease. After reviewing the risks                            and benefits, the patient was deemed in                            satisfactory condition to undergo the procedure.                            After obtaining  informed consent, the colonoscope                            was passed under direct vision. Throughout the                            procedure, the patient's blood pressure, pulse, and                            oxygen saturations were monitored continuously. The                            CF-HQ190L ([id number]) Olympus colonoscope was                            introduced through the anus and advanced to the the                            mid-sigmoid colon but there seemd to be a stricture                            at that levels. The [id number]-[id number] ([id number]) Olympus                            pediatric colonscope was introduced through the and                            advanced to the terminal ileum without difficulty.                            The colonoscopy was performed with moderate                            difficulty due to inadequate bowel prep. Successful                            completion of the procedure was aided by lavage.                            The patient tolerated the procedure well. The  quality of the bowel preparation was fair. The                            terminal ileum, the ileocecal valve, the                            appendiceal orifice and the rectum were                            photographed. The bowel preparation used was                            GoLYTELY via split dose instruction. Scope In: 7:22:03 AM Scope Out: 7:48:49 AM Scope Withdrawal Time: 0 hours 10 minutes 59 seconds  Total Procedure Duration: 0 hours 26 minutes 46 seconds  Findings:      A continuous area of severely ulcerated mucosa with stigmata of recent       bleeding was present in the recto-sigmoid colon, in the sigmoid colon,       in the descending colon, in the transverse colon and at the hepatic       flexure. Biopsies were taken with a cold forceps for histology; the       rectal mucosa appeared normal; the right colon and cecum appeared normal        as well. There seemed to a slight stricturing in the mid-sigmiod colon       and therefore we had to change to a pediatric scope..      The terminal ileum appeared normal.      No additional abnormalities were found on retroflexion. Impression:               - Preparation of the colon was fair at best with                            aggressive lavage.                           - Severe mucosal ulceration was noted from 5 cm to                            the hepatic flexure-multiple random biopsies were                            done to confirm UC.                           - The examined portion of the ileum was normal. Moderate Sedation:      MAC used. Recommendation:           - High fiber diet with augmented water consumption                            daily.                           - Continue present medications.                           -  Await pathology results.                           - Return to my office in 2 weeks.                           - If the patient has any abnormal GI symptoms in                            the interim, she has been advised to call the                            office ASAP for further recommendations. Procedure Code(s):        --- Professional ---                           [phone], Colonoscopy, flexible; with biopsy, single                            or multiple Diagnosis Code(s):        --- Professional ---                           K62.5, Hemorrhage of anus and rectum                           R19.4, Change in bowel habit                           R93.3, Abnormal findings on diagnostic imaging of                            other parts of digestive tract                           Z12.11, Encounter for screening for malignant                            neoplasm of colon CPT copyright 2019 [employer]. All rights reserved. The codes documented in this report are preliminary and upon coder review may  be revised to meet current  compliance requirements. Juanita Craver, MD Juanita Craver, MD 10/28/2018 8:07:13 AM This report has been signed electronically. Number of Addenda: 0

## 2018-10-28 NOTE — Anesthesia Preprocedure Evaluation (Addendum)
Anesthesia Evaluation  Patient identified by MRN, date of birth, ID band Patient awake    Reviewed: Allergy & Precautions, NPO status , Patient's Chart, lab work & pertinent test results  Airway Mallampati: I  TM Distance: >3 FB Neck ROM: full    Dental  (+) Dental Advidsory Given, Edentulous Upper, Edentulous Lower   Pulmonary asthma , former smoker,    Pulmonary exam normal breath sounds clear to auscultation       Cardiovascular hypertension, Pt. on medications and Pt. on home beta blockers + CAD and + Cardiac Stents  Normal cardiovascular exam Rhythm:Regular Rate:Normal  08/14/15: LHC. Nl LV function. Non-obst CAD. Patent stent.   Neuro/Psych PSYCHIATRIC DISORDERS Depression negative neurological ROS     GI/Hepatic Neg liver ROS, GERD  Medicated and Controlled,  Endo/Other  diabetes, Type 2, Insulin DependentHypothyroidism Morbid obesity  Renal/GU negative Renal ROS     Musculoskeletal  (+) Arthritis ,   Abdominal (+) + obese,   Peds  Hematology  (+) Blood dyscrasia, anemia ,   Anesthesia Other Findings   Reproductive/Obstetrics                             Lab Results  Component Value Date   WBC 11.2 (H) 10/18/2018   HGB 9.8 (L) 10/18/2018   HCT 30.4 (L) 10/18/2018   MCV 87 10/18/2018   PLT 311 10/18/2018   Lab Results  Component Value Date   CREATININE 0.80 09/15/2018   BUN 6 09/15/2018   NA 141 09/15/2018   K 4.4 09/15/2018   CL 108 09/15/2018   CO2 25 09/15/2018    Anesthesia Physical  Anesthesia Plan  ASA: III  Anesthesia Plan: MAC   Post-op Pain Management:    Induction: Intravenous  PONV Risk Score and Plan: 2 and Ondansetron  Airway Management Planned: Nasal Cannula, Simple Face Mask and Natural Airway  Additional Equipment:   Intra-op Plan:   Post-operative Plan:   Informed Consent:   Plan Discussed with: CRNA  Anesthesia Plan Comments:          Anesthesia Quick Evaluation

## 2018-10-28 NOTE — Anesthesia Postprocedure Evaluation (Signed)
Anesthesia Post Note  Patient: Kendra Adams  Procedure(s) Performed: COLONOSCOPY WITH PROPOFOL (N/A ) BIOPSY     Patient location during evaluation: Endoscopy Anesthesia Type: MAC Level of consciousness: awake Pain management: pain level controlled Vital Signs Assessment: post-procedure vital signs reviewed and stable Respiratory status: spontaneous breathing Cardiovascular status: stable Postop Assessment: no apparent nausea or vomiting Anesthetic complications: no    Last Vitals:  Vitals:   10/28/18 0810 10/28/18 0820  BP: (!) 115/52   Pulse: 72 75  Resp: 17 18  Temp:    SpO2: 99% 100%    Last Pain:  Vitals:   10/28/18 0756  TempSrc: Oral  PainSc:    Pain Goal:                   Huston Foley

## 2018-10-28 NOTE — Discharge Instructions (Signed)

## 2018-10-28 NOTE — Transfer of Care (Signed)
Immediate Anesthesia Transfer of Care Note  Patient: Kendra Adams  Procedure(s) Performed: COLONOSCOPY WITH PROPOFOL (N/A ) BIOPSY  Patient Location: PACU  Anesthesia Type:MAC  Level of Consciousness: awake, alert  and oriented  Airway & Oxygen Therapy: Patient Spontanous Breathing and Patient connected to nasal cannula oxygen  Post-op Assessment: Report given to RN and Post -op Vital signs reviewed and stable  Post vital signs: Reviewed and stable  Last Vitals:  Vitals Value Taken Time  BP    Temp    Pulse 77 10/28/18 0756  Resp 16 10/28/18 0756  SpO2 100 % 10/28/18 0756  Vitals shown include unvalidated device data.  Last Pain:  Vitals:   10/28/18 0619  TempSrc: Oral  PainSc: 0-No pain         Complications: No apparent anesthesia complications

## 2018-10-28 NOTE — H&P (Signed)
Kendra Adams is an 58 y.o. female.   Chief Complaint: Rectal bleeding HPI: Kendra Adams is a 58 year old white female who was in the hospital today at [facility] long for a colonoscopy.  She has had problems with anemia and rectal bleeding thought to be from a colitis noted on a CT scan done on 09/12/2018; patient has been treated with metronidazole and ciprofloxacin and is currently on Lialda.  This seems to be helping her symptoms.  See office notes for further details  Past Medical History:  Diagnosis Date  . Allergic rhinitis   . Asthma   . CAD (coronary artery disease)    a. 12/13/10: mRCA 99%, EF 65-70%;  s/p BMS to mRCA  . COPD (chronic obstructive pulmonary disease) ([place])   . Depression   . DM2 (diabetes mellitus, type 2) ([place])   . GERD (gastroesophageal reflux disease)   . HTN (hypertension)   . Hyperlipidemia   . Hyperlipidemia   . Hypothyroidism   . Seasonal allergies   . Tobacco abuse    Past Surgical History:  Procedure Laterality Date  . CARDIAC CATHETERIZATION N/A 08/14/2015   Procedure: Left Heart Cath and Coronary Angiography;  Surgeon: Peter M Martinique, MD;  Location: [facility];  Service: Cardiovascular;  Laterality: N/A;  . CARPAL TUNNEL RELEASE     w/ bone spurs  . CHOLECYSTECTOMY N/A 08/15/2015   Procedure: LAPAROSCOPIC CHOLECYSTECTOMY WITH ATTEMPTED INTRAOPERATIVE CHOLANGIOGRAM;  Surgeon: Erroll Luna, MD;  Location: [place];  Service: General;  Laterality: N/A;  . CORONARY ANGIOPLASTY WITH STENT PLACEMENT    . DILATION AND CURETTAGE OF UTERUS      Family History  Problem Relation Age of Onset  . Diabetes Mother   . Hypertension Mother    Social History:  reports that she quit smoking about 4 years ago. Her smoking use included cigarettes. She has a 4.00 pack-year smoking history. She has never used smokeless tobacco. She reports current drug use. Frequency: 7.00 times per week. Drug: Marijuana. She reports that she does not drink alcohol.  Allergies:   Allergies  Allergen Reactions  . Gabapentin     Skin rash, lip swelling  . Nsaids     IBU - Rectal bleeds; states ASA & Tylenol OK  . Opium Nausea And Vomiting  . Tramadol Hcl     "Just doesn't do anything for me"    Medications Prior to Admission  Medication Sig Dispense Refill  . albuterol (PROVENTIL HFA;VENTOLIN HFA) 108 (90 Base) MCG/ACT inhaler Inhale 1-2 puffs into the lungs every 6 (six) hours as needed for wheezing or shortness of breath. 1 Inhaler 0  . aspirin 81 MG chewable tablet Chew 81 mg by mouth every morning.     Marland Kitchen atorvastatin (LIPITOR) 80 MG tablet Take 1 tablet (80 mg total) by mouth daily. 90 tablet 1  . betamethasone dipropionate (DIPROLENE) 0.05 % cream Apply topically 2 (two) times daily. (Patient taking differently: Apply 1 application topically 2 (two) times daily as needed (skin care). ) 45 g 2  . dicyclomine (BENTYL) 10 MG capsule Take 1 capsule (10 mg total) by mouth 3 (three) times daily before meals. IM Program 90 capsule 0  . ferrous sulfate 325 (65 FE) MG EC tablet Take 1 tablet (325 mg total) by mouth daily with breakfast. 30 tablet 11  . glipiZIDE (GLUCOTROL) 10 MG tablet Take 1 tablet by mouth once daily 90 tablet 0  . glucose blood (CONTOUR TEST) test strip Use as instructed 100  each 12  . Lancets MISC 1 Units by Does not apply route daily. 50 each 0  . levothyroxine (SYNTHROID) 150 MCG tablet Take 1 tablet (150 mcg total) by mouth daily before breakfast. 90 tablet 1  . loratadine (CLARITIN) 10 MG tablet Take 1 tablet (10 mg total) by mouth daily. 30 tablet 2  . losartan (COZAAR) 50 MG tablet Take 1 tablet (50 mg total) by mouth at bedtime. 90 tablet 1  . metFORMIN (GLUCOPHAGE) 1000 MG tablet Take 1 tablet (1,000 mg total) by mouth 2 (two) times daily. 60 tablet 11  . metoprolol succinate (TOPROL-XL) 100 MG 24 hr tablet TAKE 1 TABLET BY MOUTH ONCE DAILY WITH  OR  IMMEDIATELY  FOLLOWING  A  MEAL (Patient taking differently: Take 100 mg by mouth daily. )  90 tablet 0  . nitroGLYCERIN (NITROSTAT) 0.4 MG SL tablet Place 1 tablet (0.4 mg total) under the tongue every 5 (five) minutes as needed for chest pain. 25 tablet 3  . Omega-3 Fatty Acids (FISH OIL) 1000 MG CAPS Take 1 capsule by mouth every morning.     Marland Kitchen omeprazole (PRILOSEC) 40 MG capsule TAKE 1 CAPSULE BY MOUTH ONCE DAILY (Patient taking differently: Take 40 mg by mouth daily. ) 90 capsule 3  . ondansetron (ZOFRAN) 4 MG tablet Take 1 tablet (4 mg total) by mouth every 6 (six) hours as needed for nausea. 20 tablet 0  . rOPINIRole (REQUIP) 1 MG tablet Take 1 tablet (1 mg total) by mouth at bedtime. 90 tablet 3  . sertraline (ZOLOFT) 100 MG tablet Take 1.5 tablets (150 mg total) by mouth daily. 45 tablet 11  . Spacer/Aero-Holding Chambers (AEROCHAMBER PLUS) inhaler Use as instructed 1 each 2  . fluticasone (FLONASE) 50 MCG/ACT nasal spray Place 2 sprays into both nostrils daily. (Patient not taking: Reported on 10/28/2018) 16 g 2  . predniSONE (DELTASONE) 10 MG tablet 4 pills daily 1st week, 3 pills daily 2nd week, 2 pills daily 3rd week, 1 pill daily 4th week, 1 half pill daily 5th week. (Patient not taking: Reported on 10/28/2018) 74 tablet 0  . simethicone (MYLICON) 80 MG chewable tablet Chew 1 tablet (80 mg total) by mouth every 6 (six) hours as needed for flatulence (Bloating). (Patient not taking: Reported on 10/28/2018) 30 tablet 1  . triamcinolone cream (KENALOG) 0.1 % Apply 1 application topically 2 (two) times daily. (Patient not taking: Reported on 10/28/2018) 80 g 3    Results for orders placed or performed during the hospital encounter of 10/28/18 (from the past 48 hour(s))  Glucose, capillary     Status: Abnormal   Collection Time: 10/28/18  6:35 AM  Result Value Ref Range   Glucose-Capillary 114 (H) 70 - 99 mg/dL   No results found.  Review of Systems  Constitutional: Negative.   HENT: Negative.   Eyes: Negative.   Respiratory: Positive for cough. Negative for hemoptysis,  sputum production, shortness of breath and wheezing.   Cardiovascular: Negative.   Gastrointestinal: Positive for blood in stool, diarrhea and heartburn. Negative for abdominal pain and vomiting.  Genitourinary: Negative.   Musculoskeletal: Positive for back pain and joint pain.  Psychiatric/Behavioral: Positive for depression.   Blood pressure (!) 102/51, pulse 94, temperature 98.2 F (36.8 C), temperature source Oral, resp. rate 20, height 5' 3"  (1.6 m), weight 109 kg, SpO2 96 %. Physical Exam  Constitutional: She is oriented to person, place, and time. She appears well-developed and well-nourished.  Morbidly obese  HENT:  Head:  Normocephalic and atraumatic.  Eyes: Pupils are equal, round, and reactive to light. Conjunctivae and EOM are normal.  Neck: Normal range of motion.  Cardiovascular: Normal rate and regular rhythm.  Respiratory: Effort normal and breath sounds normal.  GI: Soft. Bowel sounds are normal.  Neurological: She is alert and oriented to person, place, and time.  Skin: Skin is warm and dry.  Psychiatric: She has a normal mood and affect. Her behavior is normal. Judgment and thought content normal.    Assessment/Plan 1) rectal bleeding with anemia and abnormal CT scan showing colitis-proceed with a colonoscopy at this time.  Juanita Craver, MD 10/28/2018, 7:14 AM

## 2018-10-29 ENCOUNTER — Encounter (HOSPITAL_COMMUNITY): Payer: Self-pay | Admitting: Gastroenterology

## 2018-10-29 ENCOUNTER — Other Ambulatory Visit: Payer: Self-pay | Admitting: Internal Medicine

## 2018-10-29 DIAGNOSIS — E118 Type 2 diabetes mellitus with unspecified complications: Secondary | ICD-10-CM

## 2018-10-29 DIAGNOSIS — K219 Gastro-esophageal reflux disease without esophagitis: Secondary | ICD-10-CM

## 2018-11-01 ENCOUNTER — Telehealth: Payer: Self-pay | Admitting: *Deleted

## 2018-11-01 NOTE — Telephone Encounter (Signed)
Dr. Meda Coffee, last week this pt mychart messaged [place] requesting her med regimen to be changed due to dizziness/weakness.  You provided orders for her to continue on her Toprol XL 100 mg po daily and we decreased her Losartan to 50 mg po daily to take at bedtime, and log her BP/HR for a few days.  These changes were made on her med list and endorsed to the pt on 8/15.   Pt is now self-dosing her meds and has disregarded your recommendations that we gave last week, and now has decreased her Toprol XL to 50 mg po daily, and has STOPPED her Losartan all together.    So as of today she is taking her Toprol XL 100 mg tablets and breaking this in 1/2 to take 50 mg po daily.  She is NOT taking her prescribed losartan 50 mg at all anymore.   Pt is now requesting that we send in a new Rx for Toprol XL 50 mg po daily to her pharmacy and remove the losartan all-together, for she said this regimen is working best for her.  Pt unable to provide any BP/HR readings, for she is not taking this as you also advised.   Please advise on what you would like her regimen to be?  Are you ok with her decreasing her Toprol XL to 50 mg and send in a new script for this?  Also do you want her to STOP Losartan, despite she already has? Please advise!

## 2018-11-01 NOTE — Telephone Encounter (Signed)
Received fax from [facility] for metoprolol er 146m tabs with the following message "pt has been splitting 1020minto 5024mNeed new rx for 15m42mhank you".   Per 10/23/18 Mychart message with DrNelson's office-pt was to continue "toprol XL 100 mg po daily in the mornings nd decrease losartan to 50 mg po daily to be taken at night"  CMA made call to patient to confirm dose-pt states that she decreased toprol xl to 15mg47m stopped the losartan all together.  Pt states this combination is a better fit for her as she has not experienced any dizziness and is making better decisions about meal choices.  Pt can not find her BP cuff and has not obtained any BP measurements at this time.  Will defer refill request to Dr NelsoFrancesca Omance.GoldsDespina Hiddenady8/24/202010:59 AM

## 2018-11-01 NOTE — Telephone Encounter (Signed)
Left the pt a message to call the office back, so I can go over new dose change in Toprol XL to be sent into her pharmacy, as well as discuss with her further instruction from Dr Meda Coffee, for her to send [place] some BP measurements to know dosages are sufficient for her HTN.

## 2018-11-01 NOTE — Telephone Encounter (Signed)
That is okay with me, but would she be able to send [place] some blood pressure measurements so we can see if these dosages are sufficient to control her blood pressure.

## 2018-11-02 ENCOUNTER — Encounter: Payer: Self-pay | Admitting: *Deleted

## 2018-11-02 NOTE — Telephone Encounter (Signed)
Left another message for the pt to call back to go over new med regimen and recommendations per Dr. Meda Coffee.

## 2018-11-02 NOTE — Telephone Encounter (Signed)
Also sent the pt a mychart message to call the office back to discuss treatment regimen and recommendations per Dr. Meda Coffee.

## 2018-11-08 ENCOUNTER — Encounter: Payer: Self-pay | Admitting: *Deleted

## 2018-11-08 NOTE — Telephone Encounter (Signed)
Left another message for the pt to call back.  Did note in the pts chart that she did read my mychart message that was sent for her to follow-up with the office, on last week.  Pt still has not returned a call back.  Will send the pt a letter in the mail to follow-up with the office, asap, to discuss medication recommendations.

## 2018-11-09 ENCOUNTER — Encounter: Payer: Self-pay | Admitting: Internal Medicine

## 2018-11-16 ENCOUNTER — Other Ambulatory Visit: Payer: Self-pay | Admitting: Internal Medicine

## 2018-11-16 MED FILL — ONDANSETRON HCL 4 MG TABLET: 4 | 5 days supply | Qty: 20 | Fill #0

## 2018-11-18 ENCOUNTER — Encounter: Payer: Self-pay | Admitting: *Deleted

## 2018-11-23 ENCOUNTER — Other Ambulatory Visit: Payer: Self-pay | Admitting: Internal Medicine

## 2018-11-23 MED FILL — ONDANSETRON HCL 4 MG TABLET: 4 | 5 days supply | Qty: 20 | Fill #0

## 2018-11-24 ENCOUNTER — Encounter: Payer: Self-pay | Admitting: Internal Medicine

## 2018-11-29 ENCOUNTER — Other Ambulatory Visit: Payer: Self-pay | Admitting: Internal Medicine

## 2018-11-29 DIAGNOSIS — I10 Essential (primary) hypertension: Secondary | ICD-10-CM

## 2018-11-29 DIAGNOSIS — I2510 Atherosclerotic heart disease of native coronary artery without angina pectoris: Secondary | ICD-10-CM

## 2018-11-30 ENCOUNTER — Telehealth: Payer: Self-pay | Admitting: Cardiology

## 2018-11-30 DIAGNOSIS — I2510 Atherosclerotic heart disease of native coronary artery without angina pectoris: Secondary | ICD-10-CM

## 2018-11-30 DIAGNOSIS — I10 Essential (primary) hypertension: Secondary | ICD-10-CM

## 2018-11-30 MED ORDER — METOPROLOL SUCCINATE ER 100 MG PO TB24: ORAL_TABLET | ORAL | 3 refills | Status: DC

## 2018-11-30 NOTE — Telephone Encounter (Signed)
Pt calling requesting a refill on metoprolol. Pt's PCP has been refilling this medication. Would Dr. Meda Coffee like to refill this medication? Please address

## 2018-11-30 NOTE — Telephone Encounter (Signed)
Refill for metoprolol 100 mg denied. Looks like cardiology has been titrating her medications. Unclear if she should be on 50 or 100 mg. Please have her call her cardiologist to clarify and request refill from them.

## 2018-11-30 NOTE — Telephone Encounter (Signed)
Yes, please refill

## 2018-11-30 NOTE — Telephone Encounter (Signed)
New message  Pt c/o medication issue:  1. Name of Medication:metoprolol succinate (TOPROL-XL) 100 MG 24 hr tablet  2. How are you currently taking this medication (dosage and times per day)? 1 time daily  3. Are you having a reaction (difficulty breathing--STAT)? n/a  4. What is your medication issue? Patient is out of this medication and needs a new prescription sent to [facility]. Patient needs a 30 day supply.

## 2018-11-30 NOTE — Telephone Encounter (Signed)
Pt's medication was sent to pt's pharmacy as requested. Confirmation received.  °

## 2018-12-02 ENCOUNTER — Ambulatory Visit (INDEPENDENT_AMBULATORY_CARE_PROVIDER_SITE_OTHER): Payer: No Typology Code available for payment source | Admitting: Internal Medicine

## 2018-12-02 ENCOUNTER — Encounter: Payer: Self-pay | Admitting: Internal Medicine

## 2018-12-02 ENCOUNTER — Other Ambulatory Visit: Payer: Self-pay

## 2018-12-02 VITALS — BP 172/80 | HR 62 | Temp 98.8°F | Ht 63.0 in | Wt 237.5 lb

## 2018-12-02 DIAGNOSIS — E038 Other specified hypothyroidism: Secondary | ICD-10-CM

## 2018-12-02 DIAGNOSIS — I152 Hypertension secondary to endocrine disorders: Secondary | ICD-10-CM

## 2018-12-02 DIAGNOSIS — K51919 Ulcerative colitis, unspecified with unspecified complications: Secondary | ICD-10-CM

## 2018-12-02 DIAGNOSIS — R42 Dizziness and giddiness: Secondary | ICD-10-CM

## 2018-12-02 DIAGNOSIS — Z79899 Other long term (current) drug therapy: Secondary | ICD-10-CM

## 2018-12-02 DIAGNOSIS — L409 Psoriasis, unspecified: Secondary | ICD-10-CM

## 2018-12-02 DIAGNOSIS — E118 Type 2 diabetes mellitus with unspecified complications: Secondary | ICD-10-CM | POA: Diagnosis not present

## 2018-12-02 DIAGNOSIS — Z79890 Hormone replacement therapy (postmenopausal): Secondary | ICD-10-CM

## 2018-12-02 DIAGNOSIS — K580 Irritable bowel syndrome with diarrhea: Secondary | ICD-10-CM

## 2018-12-02 DIAGNOSIS — M7022 Olecranon bursitis, left elbow: Secondary | ICD-10-CM | POA: Insufficient documentation

## 2018-12-02 DIAGNOSIS — E1159 Type 2 diabetes mellitus with other circulatory complications: Secondary | ICD-10-CM

## 2018-12-02 DIAGNOSIS — R531 Weakness: Secondary | ICD-10-CM

## 2018-12-02 DIAGNOSIS — R300 Dysuria: Secondary | ICD-10-CM

## 2018-12-02 DIAGNOSIS — R39198 Other difficulties with micturition: Secondary | ICD-10-CM

## 2018-12-02 DIAGNOSIS — Z23 Encounter for immunization: Secondary | ICD-10-CM | POA: Insufficient documentation

## 2018-12-02 DIAGNOSIS — I10 Essential (primary) hypertension: Secondary | ICD-10-CM

## 2018-12-02 DIAGNOSIS — Z7984 Long term (current) use of oral hypoglycemic drugs: Secondary | ICD-10-CM

## 2018-12-02 DIAGNOSIS — K529 Noninfective gastroenteritis and colitis, unspecified: Secondary | ICD-10-CM

## 2018-12-02 DIAGNOSIS — E039 Hypothyroidism, unspecified: Secondary | ICD-10-CM

## 2018-12-02 DIAGNOSIS — L209 Atopic dermatitis, unspecified: Secondary | ICD-10-CM

## 2018-12-02 DIAGNOSIS — F17210 Nicotine dependence, cigarettes, uncomplicated: Secondary | ICD-10-CM

## 2018-12-02 DIAGNOSIS — Z8719 Personal history of other diseases of the digestive system: Secondary | ICD-10-CM

## 2018-12-02 LAB — POCT GLYCOSYLATED HEMOGLOBIN (HGB A1C): Hemoglobin A1C: 6.2 % — AB (ref 4.0–5.6)

## 2018-12-02 LAB — POCT URINALYSIS DIPSTICK
Bilirubin, UA: NEGATIVE
Blood, UA: NEGATIVE
Glucose, UA: NEGATIVE
Ketones, UA: NEGATIVE
Leukocytes, UA: NEGATIVE
Nitrite, UA: NEGATIVE
Protein, UA: POSITIVE — AB
Spec Grav, UA: >=1.03 — AB (ref 1.010–1.025)
Urobilinogen, UA: 0.2 E.U./dL
pH, UA: 6 (ref 5.0–8.0)

## 2018-12-02 LAB — GLUCOSE, CAPILLARY: Glucose-Capillary: 128 mg/dL — ABNORMAL HIGH (ref 70–99)

## 2018-12-02 MED ORDER — TRIAMCINOLONE ACETONIDE 0.1 % EX CREA: 1.0000 "application " | TOPICAL_CREAM | Freq: Two times a day (BID) | CUTANEOUS | 3 refills | Status: DC

## 2018-12-02 MED ORDER — PREDNISONE 20 MG PO TABS: ORAL_TABLET | ORAL | 0 refills | Status: DC

## 2018-12-02 MED ORDER — LOSARTAN POTASSIUM 100 MG PO TABS: 100.0000 mg | ORAL_TABLET | Freq: Every day | ORAL | 0 refills | Status: DC

## 2018-12-02 NOTE — Progress Notes (Signed)
Subjective:   Patient ID: Kendra Adams female   DOB: 02-18-1961 58 y.o.   MRN: [id number]  HPI: Kendra Adams is a 58 y.o. female with past medical history outlined below here for DM follow up and diarrhea. For the details of today's visit, please refer to the assessment and plan.   Past Medical History:  Diagnosis Date  . Allergic rhinitis   . Asthma   . CAD (coronary artery disease)    a. 12/13/10: mRCA 99%, EF 65-70%;  s/p BMS to mRCA  . COPD (chronic obstructive pulmonary disease) ([place])   . Depression   . DM2 (diabetes mellitus, type 2) ([place])   . GERD (gastroesophageal reflux disease)   . HTN (hypertension)   . Hyperlipidemia   . Hyperlipidemia   . Hypothyroidism   . Seasonal allergies   . Tobacco abuse    Current Outpatient Medications  Medication Sig Dispense Refill  . albuterol (PROVENTIL HFA;VENTOLIN HFA) 108 (90 Base) MCG/ACT inhaler Inhale 1-2 puffs into the lungs every 6 (six) hours as needed for wheezing or shortness of breath. 1 Inhaler 0  . aspirin 81 MG chewable tablet Chew 81 mg by mouth every morning.     Marland Kitchen atorvastatin (LIPITOR) 80 MG tablet Take 1 tablet (80 mg total) by mouth daily. 90 tablet 1  . betamethasone dipropionate (DIPROLENE) 0.05 % cream Apply topically 2 (two) times daily. (Patient taking differently: Apply 1 application topically 2 (two) times daily as needed (skin care). ) 45 g 2  . dicyclomine (BENTYL) 10 MG capsule Take 1 capsule (10 mg total) by mouth 3 (three) times daily before meals. IM Program 90 capsule 0  . ferrous sulfate 325 (65 FE) MG EC tablet Take 1 tablet (325 mg total) by mouth daily with breakfast. 30 tablet 11  . fluticasone (FLONASE) 50 MCG/ACT nasal spray Place 2 sprays into both nostrils daily. (Patient not taking: Reported on 10/28/2018) 16 g 2  . glipiZIDE (GLUCOTROL) 10 MG tablet Take 1 tablet by mouth once daily 90 tablet 0  . glucose blood (CONTOUR TEST) test strip Use as instructed 100 each 12  . Lancets MISC  1 Units by Does not apply route daily. 50 each 0  . levothyroxine (SYNTHROID) 150 MCG tablet Take 1 tablet (150 mcg total) by mouth daily before breakfast. 90 tablet 1  . loratadine (CLARITIN) 10 MG tablet Take 1 tablet (10 mg total) by mouth daily. 30 tablet 2  . losartan (COZAAR) 100 MG tablet Take 1 tablet (100 mg total) by mouth at bedtime. 30 tablet 0  . metFORMIN (GLUCOPHAGE) 1000 MG tablet Take 1 tablet (1,000 mg total) by mouth 2 (two) times daily. 60 tablet 11  . metoprolol succinate (TOPROL-XL) 100 MG 24 hr tablet TAKE 1 TABLET BY MOUTH ONCE DAILY WITH  OR  IMMEDIATELY  FOLLOWING  A  MEAL 90 tablet 3  . nitroGLYCERIN (NITROSTAT) 0.4 MG SL tablet Place 1 tablet (0.4 mg total) under the tongue every 5 (five) minutes as needed for chest pain. 25 tablet 3  . Omega-3 Fatty Acids (FISH OIL) 1000 MG CAPS Take 1 capsule by mouth every morning.     Marland Kitchen omeprazole (PRILOSEC) 40 MG capsule Take 1 capsule (40 mg total) by mouth daily. 90 capsule 3  . ondansetron (ZOFRAN) 4 MG tablet TAKE 1 TABLET (4 MG TOTAL) BY MOUTH EVERY 6 (SIX) HOURS AS NEEDED FOR NAUSEA. 20 tablet 0  . predniSONE (DELTASONE) 20 MG tablet Please take 40 mg (2 tabs)  daily with breakfast x 1 week, then decrease to 30 mg (1 and 1/2 tabs) daily x 1 week, then 20 mg (1 tab) daily x 1 week, then 10 mg (1/2 tab) daily x 1 week then stop 35 tablet 0  . rOPINIRole (REQUIP) 1 MG tablet Take 1 tablet (1 mg total) by mouth at bedtime. 90 tablet 3  . sertraline (ZOLOFT) 100 MG tablet Take 1.5 tablets (150 mg total) by mouth daily. 45 tablet 11  . Spacer/Aero-Holding Chambers (AEROCHAMBER PLUS) inhaler Use as instructed 1 each 2  . triamcinolone cream (KENALOG) 0.1 % Apply 1 application topically 2 (two) times daily. 80 g 3   No current facility-administered medications for this visit.    Family History  Problem Relation Age of Onset  . Diabetes Mother   . Hypertension Mother    Social History   Socioeconomic History  . Marital status:  Married    Spouse name: Not on file  . Number of children: 0  . Years of education: 10th grade  . Highest education level: Not on file  Occupational History  . Occupation: Conservation officer, [employer]    Comment: self-employed  . 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 Financial resource strain: Not on file  . Food insecurity    Worry: Not on file    Inability: Not on file  . Transportation needs    Medical: Not on file    Non-medical: Not on file  Tobacco Use  . Smoking status: Current Every Day Smoker    Packs/day: 0.30    Years: 40.00    Pack years: 12.00    Types: Cigarettes    Last attempt to quit: 05/19/2014    Years since quitting: 4.5  . Smokeless tobacco: Never Used  Substance and Sexual Activity  . Alcohol use: No    Alcohol/week: 0.0 standard drinks  . Drug use: Yes    Frequency: 7.0 times per week    Types: Marijuana    Comment: Marijuana Use  . Sexual activity: Yes  Lifestyle  . Physical activity    Days per week: 0 days    Minutes per session: 0 min  . Stress: Not at all  Relationships  . Social connections    Talks on phone: More than three times a week    Gets together: Once a week    Attends religious service: Never    Active member of club or organization: No    Attends meetings of clubs or organizations: Never    Relationship status: Married  Other Topics Concern  . Not on file  Social History Narrative   Father died of suicide (gunshot) in 54. 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       Patient lives with her husband, her husband's niece Tammy who is mentally retarded and her best friend and the friend's daughter and 6 dogs                Review of Systems: Review of Systems  Respiratory: Negative for shortness of breath.   Cardiovascular: Negative for chest pain.  Neurological: Positive for dizziness and weakness.     Objective:   Physical Exam:  Vitals:   12/02/18 0850 12/02/18 0920  BP: (!) 152/74 (!) 172/80  Pulse: 78 62  Temp: 98.8 F (37.1 C)   TempSrc: Oral  SpO2: 96%   Weight: 237 lb 8 oz (107.7 kg)   Height: 5' 3"  (1.6 m)     Physical Exam  Constitutional: She is oriented to person, place, and time and well-developed, well-nourished, and in no distress. No distress.  HENT:  Head: Normocephalic and atraumatic.  Mouth/Throat: No oropharyngeal exudate.  Eyes: EOM are normal. No scleral icterus.  Cardiovascular: Normal rate and regular rhythm. Exam reveals no gallop and no friction rub.  No murmur heard. Pulmonary/Chest: Effort normal and breath sounds normal. No respiratory distress.  Abdominal: Soft. Bowel sounds are normal. She exhibits no distension.  Musculoskeletal:        General: No tenderness or edema.  Neurological: She is alert and oriented to person, place, and time. No cranial nerve deficit. Gait normal.  Skin: Skin is warm and dry. Rash noted.  Psychiatric: Mood and affect normal.     Assessment & Plan:

## 2018-12-02 NOTE — Assessment & Plan Note (Addendum)
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  She has been stable on 150 mcg of Synthroid daily for many years.  Last TSH checked 3 months ago was mildly elevated at 5.5.  Repeat TSH today.  ADDENDUM: TSH within normal limits. Continue current synthroid dose.

## 2018-12-02 NOTE — Assessment & Plan Note (Signed)
Received 1 of 2 heplisav-B injections today. Return 1 month for second dose.

## 2018-12-02 NOTE — Assessment & Plan Note (Signed)
Patient is complaining of difficulty voiding bladder.  She endorses the sensation of needing to urinate but difficulty initiating stream.  She denies any pain with urination.  Point-of-care urinalysis today negative for nitrites and leukocytes.  Unclear reason for symptoms, will continue to monitor.

## 2018-12-02 NOTE — Progress Notes (Signed)
p 

## 2018-12-02 NOTE — Assessment & Plan Note (Addendum)
A few months ago, patient developed rectal bleeding and diarrhea. She was admitted to the hospital shortly after and found to have colitis spanning from her ascending colon to the rectum with a small intramural abscess adjacent to the sigmoid colon on CT scan.  Patient was treated with Cipro/Flagyl and discharged on a prednisone taper.  She followed up with GI and underwent outpatient colonoscopy 10/28/2018 which showed severe mucosal ulceration in the rectosigmoid colon, the sigmoid colon, the descending colon the transverse colon and at the hepatic flexure concerning for IBD and likely Chron's disease.  Biopsies were taken and histology consistent with moderately active chronic colitis and inflammatory bowel disease.  Per notes, patient was to return for GI follow-up 2 weeks following colonoscopy.  It is unclear if this happened.  Patient does not currently have follow-up scheduled with GI.  Today, patient tells me that she has been experiencing multiple (up to 10) episodes of diarrhea a day often associated with incontinence.  She frequently has accidents and is unable to make it to the bathroom in time.  This is causes her significant distress and anxiety leaving the house.  She noted that her symptoms were better when on prednisone.  She completed the taper over a month ago.  She is in the process of getting started on Humira but needs to complete her hepatitis B vaccination series first.  She received her first dose today (1 of 2, second dose due in 1 month).  She endorses some mild intermittent blood in her stool.  Given the severity and recurrence of her symptoms, I think it is reasonable to restart a steroid taper.  I have asked her to call her gastroenterologist to schedule follow-up ASAP.  Plan: --Start prednisone 40 mg daily x1 week, then decreased 30 mg (1 and 1/2 tablet) daily x 1 week, then 20 mg (1 tablet) daily x 1 week, then 10 mg (1/2 tablet) daily x 1 week, then stop.  -- Follow up GI asap   -- Received heplisav-B vaccine today; follow up 1 month for second dose to complete series   ADDENDUM: Called patient with lab results and to follow up. Two days after her visit with me, patient developed fevers at home and presented to the ED for evaluation. Labs were notable for a significant leukocytosis of 19.4, up from 9.7 at her visit with me two days prior. She was started on metronidazole and ciprofloxacin and told to continue her prednisone taper and follow up with GI. Today patient tells me that her diarrhea has improved. She is now having only 4-5 BM a day and the stool is more formed. She is no longer have incontinence and her fevers have resolved. She still has not scheduled follow up with her gastroenterologist. Instructed patient to call their office today to schedule.

## 2018-12-02 NOTE — Assessment & Plan Note (Addendum)
Refilled triamcinolone cream. Instructed patient to discontinue all other creams and lotions (has been using multiple OTC). Apply triamcinolone BID, wait 15 minutes after application then apply Eucerin cream.   Patient is also being started on prednisone today for uncontrolled symptoms of IBD.  We will continue to monitor her psoriasis as this may cause flare or rebound effect once taper is complete.

## 2018-12-02 NOTE — Assessment & Plan Note (Addendum)
Uncontrolled today, 152/74 and 172/80 on recheck. Patient recently stopped her losartan and decreased her metoprolol in the setting of acute blood loss anemia from IBD and dizziness. She has since resumed her full dose of metoprolol 100 mg and half dose of losartan, 50 mg (previously 100).  --Continue metoprolol 100 mg daily -Increase losartan back to 100 mg daily - Instructed patient to call with recurrent symptoms of bleeding or dizziness.

## 2018-12-02 NOTE — Patient Instructions (Addendum)
Ms. Grout,  Today we talked about,   Diabetes: Continue metformin, decrease glipizide to 5 mg (1/2 tablet) once a day.  Check your blood sugar at least twice a day. If you develop symptoms of low blood sugar again, please check your blood sugar and give me a call.   Diarrhea: Please start taking prednisone 40 mg (2 tabs) daily with breakfast x 1 week, then decrease to 30 mg (1 and 1/2 tabs) daily x 1 week, then 20 mg (1 tab) daily x 1 week, then 10 mg (1/2 tab) daily x 1 week then stop  Elbow Swelling: This is from inflammation called olecranon bursitis. Please avoid ibuprofen. Please use ACE bandages for compression wrapping to help with the swelling. If you develop pain or redness in the area or fever please call the office asap.   HTN: Please continue your metoprolol 100 mg increase your losartan back to 100 mg. If your BP is still elevated at follow up we will increase your losartan back to 100 mg.  Tobacco Use: Please restart your bupropion   Psoriasis: Please use triamcinolone cream twice daily. Wait 15 minutes after application then apply Eucerin cream (this is over the counter).   Follow up with me again in 6 weeks. If you have any questions or concerns, call our clinic at [phone] or after hours call [phone] and ask for the internal medicine resident on call. Thank you!  Dr. Philipp Ovens

## 2018-12-02 NOTE — Assessment & Plan Note (Addendum)
Patient has a history of type 2 diabetes currently taking metformin 1000 mg twice daily and glipizide 10 mg daily.  Hemoglobin A1c today 6.2.  Patient does not have her glucometer with her today.  She reports frequent intermittent symptoms of dizziness and lightheadedness that she believes is consistent with hypoglycemic episodes.  The symptoms resolve after eating.  I am concerned this may be due to her glipizide.  We are starting a prednisone taper today for her IBD.  I have instructed patient to decrease her glipizide to 5 mg daily.  We may need to discontinue this altogether once steroid taper is complete.  Continue daily CBG checks.  Instructed to call if she develops recurrent symptoms of hypoglycemia. -Decrease glipizide to 5 mg daily -Continue metformin 1000 mg twice daily -Check daily CBGs --Monitor with prednisone use  ADDENDUM: Called patient with lab results and to follow up home CBGs. Patient admitted that she has not been monitoring her blood sugar, but denies further episodes of dizziness associated with hypoglycemia. Instructed patient to check CBGs at least once daily while on prednisone. She has follow up scheduled with me in 2 months.

## 2018-12-02 NOTE — Assessment & Plan Note (Signed)
Patient has developed worsening hypocalcemia over the past two months. Will repeat labs today and check PTH, VitD, and magnesium levels.

## 2018-12-02 NOTE — Assessment & Plan Note (Signed)
Patient has joint effusion of her left elbow consistent with olecranon bursitis. She denies recent trauma. No pain or erythema. No evidence of infection. Unfortunately patient is unable to take NSAIDs with her IBD. Instructed patient to use ACE bandages for compression to help with the effusion. We are starting prednisone today for her active IBD. This will put her at increased risk of infection. Monitor for signs and symptoms, return precautions give.

## 2018-12-03 LAB — BMP8+ANION GAP
Anion Gap: 15 mmol/L (ref 10.0–18.0)
BUN/Creatinine Ratio: 15 (ref 9–23)
BUN: 10 mg/dL (ref 6–24)
CO2: 24 mmol/L (ref 20–29)
Calcium: 8.9 mg/dL (ref 8.7–10.2)
Chloride: 104 mmol/L (ref 96–106)
Creatinine, Ser: 0.67 mg/dL (ref 0.57–1.00)
GFR calc Af Amer: 111 mL/min/{1.73_m2} (ref >59)
GFR calc non Af Amer: 97 mL/min/{1.73_m2} (ref >59)
Glucose: 108 mg/dL — ABNORMAL HIGH (ref 65–99)
Potassium: 3.6 mmol/L (ref 3.5–5.2)
Sodium: 143 mmol/L (ref 134–144)

## 2018-12-03 LAB — CBC
Hematocrit: 32.9 % — ABNORMAL LOW (ref 34.0–46.6)
Hemoglobin: 10.7 g/dL — ABNORMAL LOW (ref 11.1–15.9)
MCH: 28.8 pg (ref 26.6–33.0)
MCHC: 32.5 g/dL (ref 31.5–35.7)
MCV: 88 fL (ref 79–97)
Platelets: 335 10*3/uL (ref 150–450)
RBC: 3.72 x10E6/uL — ABNORMAL LOW (ref 3.77–5.28)
RDW: 16.5 % — ABNORMAL HIGH (ref 11.7–15.4)
WBC: 9.7 10*3/uL (ref 3.4–10.8)

## 2018-12-03 LAB — PTH, INTACT AND CALCIUM
Calcium: 8.4 mg/dL — ABNORMAL LOW (ref 8.7–10.2)
PTH: 27 pg/mL (ref 15–65)

## 2018-12-03 LAB — TSH: TSH: 2.3 u[IU]/mL (ref 0.450–4.500)

## 2018-12-03 LAB — MAGNESIUM: Magnesium: 1.3 mg/dL — ABNORMAL LOW (ref 1.6–2.3)

## 2018-12-03 LAB — VITAMIN D 25 HYDROXY (VIT D DEFICIENCY, FRACTURES): Vit D, 25-Hydroxy: 32.8 ng/mL (ref 30.0–100.0)

## 2018-12-04 ENCOUNTER — Ambulatory Visit (HOSPITAL_COMMUNITY)
Admission: EM | Admit: 2018-12-04 | Discharge: 2018-12-04 | Disposition: A | Discharge status: Home / Self Care | Payer: No Typology Code available for payment source | Attending: Internal Medicine | Admitting: Internal Medicine

## 2018-12-04 ENCOUNTER — Other Ambulatory Visit: Payer: Self-pay

## 2018-12-04 ENCOUNTER — Encounter (HOSPITAL_COMMUNITY): Payer: Self-pay | Admitting: *Deleted

## 2018-12-04 DIAGNOSIS — Z7984 Long term (current) use of oral hypoglycemic drugs: Secondary | ICD-10-CM | POA: Diagnosis not present

## 2018-12-04 DIAGNOSIS — R42 Dizziness and giddiness: Secondary | ICD-10-CM | POA: Insufficient documentation

## 2018-12-04 DIAGNOSIS — K219 Gastro-esophageal reflux disease without esophagitis: Secondary | ICD-10-CM | POA: Diagnosis not present

## 2018-12-04 DIAGNOSIS — I700 Atherosclerosis of aorta: Secondary | ICD-10-CM | POA: Diagnosis not present

## 2018-12-04 DIAGNOSIS — Z79899 Other long term (current) drug therapy: Secondary | ICD-10-CM | POA: Insufficient documentation

## 2018-12-04 DIAGNOSIS — R1032 Left lower quadrant pain: Secondary | ICD-10-CM | POA: Diagnosis not present

## 2018-12-04 DIAGNOSIS — R509 Fever, unspecified: Secondary | ICD-10-CM | POA: Diagnosis not present

## 2018-12-04 DIAGNOSIS — J449 Chronic obstructive pulmonary disease, unspecified: Secondary | ICD-10-CM | POA: Diagnosis not present

## 2018-12-04 DIAGNOSIS — Z20828 Contact with and (suspected) exposure to other viral communicable diseases: Secondary | ICD-10-CM | POA: Diagnosis not present

## 2018-12-04 DIAGNOSIS — I10 Essential (primary) hypertension: Secondary | ICD-10-CM | POA: Insufficient documentation

## 2018-12-04 DIAGNOSIS — E039 Hypothyroidism, unspecified: Secondary | ICD-10-CM | POA: Insufficient documentation

## 2018-12-04 DIAGNOSIS — F329 Major depressive disorder, single episode, unspecified: Secondary | ICD-10-CM | POA: Diagnosis not present

## 2018-12-04 DIAGNOSIS — Z955 Presence of coronary angioplasty implant and graft: Secondary | ICD-10-CM | POA: Diagnosis not present

## 2018-12-04 DIAGNOSIS — Z7982 Long term (current) use of aspirin: Secondary | ICD-10-CM | POA: Insufficient documentation

## 2018-12-04 DIAGNOSIS — E785 Hyperlipidemia, unspecified: Secondary | ICD-10-CM | POA: Diagnosis not present

## 2018-12-04 DIAGNOSIS — F17210 Nicotine dependence, cigarettes, uncomplicated: Secondary | ICD-10-CM | POA: Insufficient documentation

## 2018-12-04 DIAGNOSIS — K5130 Ulcerative (chronic) rectosigmoiditis without complications: Secondary | ICD-10-CM | POA: Insufficient documentation

## 2018-12-04 DIAGNOSIS — E1159 Type 2 diabetes mellitus with other circulatory complications: Secondary | ICD-10-CM | POA: Insufficient documentation

## 2018-12-04 DIAGNOSIS — D649 Anemia, unspecified: Secondary | ICD-10-CM | POA: Diagnosis not present

## 2018-12-04 DIAGNOSIS — I2510 Atherosclerotic heart disease of native coronary artery without angina pectoris: Secondary | ICD-10-CM | POA: Insufficient documentation

## 2018-12-04 HISTORY — DX: Gastrointestinal hemorrhage, unspecified: K92.2

## 2018-12-04 HISTORY — DX: Atopic dermatitis, unspecified: L20.9

## 2018-12-04 LAB — CBC
HCT: 34.5 % — ABNORMAL LOW (ref 36.0–46.0)
Hemoglobin: 10.6 g/dL — ABNORMAL LOW (ref 12.0–15.0)
MCH: 28.1 pg (ref 26.0–34.0)
MCHC: 30.7 g/dL (ref 30.0–36.0)
MCV: 91.5 fL (ref 80.0–100.0)
Platelets: 324 10*3/uL (ref 150–400)
RBC: 3.77 MIL/uL — ABNORMAL LOW (ref 3.87–5.11)
RDW: 16.9 % — ABNORMAL HIGH (ref 11.5–15.5)
WBC: 19.4 10*3/uL — ABNORMAL HIGH (ref 4.0–10.5)
nRBC: 0 % (ref 0.0–0.2)

## 2018-12-04 MED ORDER — METRONIDAZOLE 500 MG PO TABS: 500.0000 mg | ORAL_TABLET | Freq: Three times a day (TID) | ORAL | 0 refills | Status: AC

## 2018-12-04 MED ORDER — SODIUM CHLORIDE 0.9 % IV BOLUS
1000.0000 mL | Freq: Once | INTRAVENOUS | Status: AC
  Administered 2018-12-04: 1000 mL via INTRAVENOUS

## 2018-12-04 MED ORDER — CIPROFLOXACIN HCL 500 MG PO TABS: 500.0000 mg | ORAL_TABLET | Freq: Two times a day (BID) | ORAL | 0 refills | Status: AC

## 2018-12-04 NOTE — ED Triage Notes (Addendum)
Pt has hx Crohn's; recently had colonoscopy; had Hep B shot 2 days ago in prep for starting Humira.  C/O starting yesterday with LLQ abd pain; was told by GI provider to get eval if fever.  Reports fever 101.6 onset yesterday.  C/O nausea, dizziness, diarrhea.  Denies any blood in stool.

## 2018-12-04 NOTE — ED Provider Notes (Addendum)
[place]    CSN: [id number] Arrival date & time: 12/04/18  1030      History   Chief Complaint Chief Complaint  Patient presents with  . Abdominal Pain  . Fever    HPI Kendra Adams is a 58 y.o. female with a history of ulcerative colitis comes to urgent care with complaints of left lower quadrant abdominal pain of 1 day duration.  Patient had rest of 2 hepatitis B vaccinations a few days ago.  She says yesterday she started having left lower quadrant abdominal pain.  Pain was crampy and sharp.  It is of moderate severity.  No known aggravating or relieving factors.  Pain does not radiate.  Not associated with any nausea vomiting or abdominal distention.  She has been passing some soft stool with no blood.  He experienced a fever 100.5 last night.  She has some dizziness and lightheadedness but denies any near-syncope or syncopal events.  She is scheduled to be placed on Humira.  She had colonoscopy done recently. Past Medical History:  Diagnosis Date  . Allergic rhinitis   . Asthma   . Atopic dermatitis   . CAD (coronary artery disease)    a. 12/13/10: mRCA 99%, EF 65-70%;  s/p BMS to mRCA  . COPD (chronic obstructive pulmonary disease) ([place])   . Depression   . DM2 (diabetes mellitus, type 2) ([place])   . GERD (gastroesophageal reflux disease)   . GI bleed   . HTN (hypertension)   . Hyperlipidemia   . Hyperlipidemia   . Hypothyroidism   . Psoriasis   . Seasonal allergies   . Tobacco abuse     Patient Active Problem List   Diagnosis Date Noted  . Need for vaccination against hepatitis B virus 12/02/2018  . Hypocalcemia 12/02/2018  . Olecranon bursitis of left elbow 12/02/2018  . Preoperative clearance 09/27/2018  . Inflammatory bowel disease 09/22/2018  . Anemia 09/22/2018  . Aortic atherosclerosis ([place]) 09/12/2018  . Bilateral hip pain 05/27/2017  . Dyspareunia, female 04/06/2017  . Atopic dermatitis 01/01/2017  . History of restless legs syndrome  11/24/2016  . 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 Psoriasis 10/15/2015  . Elevated transaminase level 08/12/2015  . Tendinitis of left rotator cuff 01/19/2015  . Tobacco use disorder 09/07/2014  . Dysuria 01/07/2013  . Morbid obesity ([place]) 03/19/2011  . Routine health maintenance 02/03/2011  . CAD (coronary artery disease) 12/26/2010  . Diabetes mellitus type 2 with complications ([place]) 82/80/0349  . Hypertension associated with diabetes ([place]) 05/10/2008  . Hypothyroidism 04/27/2008  . HLD (hyperlipidemia) 04/27/2008  . Depression 04/27/2008  . GERD 04/27/2008    Past Surgical History:  Procedure Laterality Date  . BIOPSY  10/28/2018   Procedure: BIOPSY;  Surgeon: Juanita Craver, MD;  Location: [facility];  Service: Endoscopy;;  . CARDIAC CATHETERIZATION N/A 08/14/2015   Procedure: Left Heart Cath and Coronary Angiography;  Surgeon: Peter M Martinique, MD;  Location: [facility];  Service: Cardiovascular;  Laterality: N/A;  . CARPAL TUNNEL RELEASE     w/ bone spurs  . CHOLECYSTECTOMY N/A 08/15/2015   Procedure: LAPAROSCOPIC CHOLECYSTECTOMY WITH ATTEMPTED INTRAOPERATIVE CHOLANGIOGRAM;  Surgeon: Erroll Luna, MD;  Location: [facility];  Service: General;  Laterality: N/A;  . COLONOSCOPY WITH PROPOFOL N/A 10/28/2018   Procedure: COLONOSCOPY WITH PROPOFOL;  Surgeon: Juanita Craver, MD;  Location: [facility];  Service: Endoscopy;  Laterality: N/A;  . CORONARY ANGIOPLASTY WITH STENT PLACEMENT    . DILATION  AND CURETTAGE OF UTERUS      OB History    Gravida  2   Para      Term      Preterm      AB  2   Living        SAB  1   TAB  1   Ectopic      Multiple      Live Births               Home Medications    Prior to Admission medications   Medication Sig Start Date End Date Taking? Authorizing Provider  aspirin 81 MG chewable tablet Chew 81 mg by mouth every morning.    Yes [provider]  atorvastatin (LIPITOR) 80 MG tablet  Take 1 tablet (80 mg total) by mouth daily. 02/15/18  Yes [name], MD  betamethasone dipropionate (DIPROLENE) 0.05 % cream Apply topically 2 (two) times daily. Patient taking differently: Apply 1 application topically 2 (two) times daily as needed (skin care).  02/15/18  Yes [name], MD  dicyclomine (BENTYL) 10 MG capsule Take 1 capsule (10 mg total) by mouth 3 (three) times daily before meals. IM Program 09/15/18  Yes Al Decant, MD  ferrous sulfate 325 (65 FE) MG EC tablet Take 1 tablet (325 mg total) by mouth daily with breakfast. 10/18/18 10/18/19 Yes Agyei, Caprice Kluver, MD  fluticasone (FLONASE) 50 MCG/ACT nasal spray Place 2 sprays into both nostrils daily. 02/24/17  Yes Jule Ser, DO  glipiZIDE (GLUCOTROL) 10 MG tablet Take 1 tablet by mouth once daily 11/01/18  Yes Sid Falcon, MD  loratadine (CLARITIN) 10 MG tablet Take 1 tablet (10 mg total) by mouth daily. 02/15/18  Yes [name], MD  losartan (COZAAR) 100 MG tablet Take 1 tablet (100 mg total) by mouth at bedtime. 12/02/18  Yes [name], MD  metFORMIN (GLUCOPHAGE) 1000 MG tablet Take 1 tablet (1,000 mg total) by mouth 2 (two) times daily. 09/02/18  Yes [name], MD  metoprolol succinate (TOPROL-XL) 100 MG 24 hr tablet TAKE 1 TABLET BY MOUTH ONCE DAILY WITH  OR  IMMEDIATELY  FOLLOWING  A  MEAL 11/30/18  Yes Dorothy Spark, MD  Omega-3 Fatty Acids (FISH OIL) 1000 MG CAPS Take 1 capsule by mouth every morning.    Yes [provider]  omeprazole (PRILOSEC) 40 MG capsule Take 1 capsule (40 mg total) by mouth daily. 11/01/18  Yes Sid Falcon, MD  predniSONE (DELTASONE) 20 MG tablet Please take 40 mg (2 tabs) daily with breakfast x 1 week, then decrease to 30 mg (1 and 1/2 tabs) daily x 1 week, then 20 mg (1 tab) daily x 1 week, then 10 mg (1/2 tab) daily x 1 week then stop 12/02/18  Yes [name], MD  rOPINIRole (REQUIP) 1 MG tablet Take 1 tablet (1 mg total) by mouth at bedtime.  09/02/18  Yes [name], MD  sertraline (ZOLOFT) 100 MG tablet Take 1.5 tablets (150 mg total) by mouth daily. 09/02/18  Yes [name], MD  albuterol (PROVENTIL HFA;VENTOLIN HFA) 108 (90 Base) MCG/ACT inhaler Inhale 1-2 puffs into the lungs every 6 (six) hours as needed for wheezing or shortness of breath. 01/01/18   [name], MD  ciprofloxacin (CIPRO) 500 MG tablet Take 1 tablet (500 mg total) by mouth every 12 (twelve) hours for 7 days. 12/04/18 12/11/18  Chase Picket, MD  glucose blood (CONTOUR TEST) test strip Use as instructed 09/15/18  Bartholomew Crews, MD  Lancets MISC 1 Units by Does not apply route daily. 09/15/18   Al Decant, MD  levothyroxine (SYNTHROID) 150 MCG tablet Take 1 tablet (150 mcg total) by mouth daily before breakfast. 10/19/18   Axel Filler, MD  metroNIDAZOLE (FLAGYL) 500 MG tablet Take 1 tablet (500 mg total) by mouth 3 (three) times daily for 7 days. 12/04/18 12/11/18  LampteyMyrene Galas, MD  nitroGLYCERIN (NITROSTAT) 0.4 MG SL tablet Place 1 tablet (0.4 mg total) under the tongue every 5 (five) minutes as needed for chest pain. 09/28/18   [name], PA-C  ondansetron (ZOFRAN) 4 MG tablet TAKE 1 TABLET (4 MG TOTAL) BY MOUTH EVERY 6 (SIX) HOURS AS NEEDED FOR NAUSEA. 11/23/18   [name], MD  Spacer/Aero-Holding Chambers (AEROCHAMBER PLUS) inhaler Use as instructed 02/03/15   Melynda Ripple, MD  triamcinolone cream (KENALOG) 0.1 % Apply 1 application topically 2 (two) times daily. 12/02/18   [name], MD    Family History Family History  Problem Relation Age of Onset  . Diabetes Mother   . Hypertension Mother     Social History Social History   Tobacco Use  . Smoking status: Current Every Day Smoker    Packs/day: 0.30    Years: 40.00    Pack years: 12.00    Types: Cigarettes    Last attempt to quit: 05/19/2014    Years since quitting: 4.5  . Smokeless tobacco: Never Used  Substance Use Topics  .  Alcohol use: No    Alcohol/week: 0.0 standard drinks  . Drug use: Yes    Frequency: 7.0 times per week    Types: Marijuana    Comment: Marijuana Use     Allergies   Gabapentin, Nsaids, Opium, and Tramadol hcl   Review of Systems Review of Systems  Constitutional: Positive for chills and fever. Negative for activity change.  HENT: Negative.   Respiratory: Negative.   Cardiovascular: Negative.   Gastrointestinal: Positive for abdominal pain and nausea. Negative for constipation, diarrhea, rectal pain and vomiting.  Genitourinary: Negative for dysuria, flank pain, pelvic pain, vaginal bleeding, vaginal discharge and vaginal pain.  Musculoskeletal: Negative.   Skin: Negative.  Negative for rash and wound.  Neurological: Positive for dizziness, weakness, light-headedness and headaches. Negative for syncope and numbness.  All other systems reviewed and are negative.    Physical Exam Triage Vital Signs ED Triage Vitals  Enc Vitals Group     BP 12/04/18 1052 (!) 163/92     Pulse Rate 12/04/18 1052 (!) 103     Resp 12/04/18 1052 18     Temp 12/04/18 1052 99.8 F (37.7 C)     Temp Source 12/04/18 1052 Other     SpO2 12/04/18 1052 96 %     Weight --      Height --      Head Circumference --      Peak Flow --      Pain Score 12/04/18 1054 7     Pain Loc --      Pain Edu? --      Excl. in [place]? --    No data found.  Updated Vital Signs BP (!) 163/92   Pulse (!) 103   Temp 99.8 F (37.7 C) (Other (Comment))   Resp 18   SpO2 96%   Visual Acuity Right Eye Distance:   Left Eye Distance:   Bilateral Distance:    Right Eye Near:   Left Eye Near:  Bilateral Near:     Physical Exam Vitals signs and nursing note reviewed.  Constitutional:      General: She is in acute distress.     Appearance: She is ill-appearing. She is not toxic-appearing.  HENT:     Head: Normocephalic.     Mouth/Throat:     Mouth: Mucous membranes are moist.     Pharynx: No pharyngeal  swelling or oropharyngeal exudate.  Eyes:     Extraocular Movements: Extraocular movements intact.  Cardiovascular:     Rate and Rhythm: Tachycardia present.  Pulmonary:     Effort: Pulmonary effort is normal.     Breath sounds: Normal breath sounds.  Abdominal:     General: Abdomen is protuberant. Bowel sounds are normal.     Palpations: Abdomen is soft.     Tenderness: There is abdominal tenderness in the left lower quadrant. There is no guarding or rebound.  Skin:    General: Skin is warm.     Capillary Refill: Capillary refill takes less than 2 seconds.     Findings: No erythema or rash.  Neurological:     General: No focal deficit present.     Mental Status: She is alert and oriented to person, place, and time.  Psychiatric:        Mood and Affect: Mood normal.        Behavior: Behavior normal.      UC Treatments / Results  Labs (all labs ordered are listed, but only abnormal results are displayed) Labs Reviewed  CBC - Abnormal; Notable for the following components:      Result Value   WBC 19.4 (*)    RBC 3.77 (*)    Hemoglobin 10.6 (*)    HCT 34.5 (*)    RDW 16.9 (*)    All other components within normal limits  NOVEL CORONAVIRUS, NAA (HOSP ORDER, SEND-OUT TO REF LAB; TAT 18-24 HRS)    EKG   Radiology No results found.  Procedures Procedures (including critical care time)  Medications Ordered in UC Medications  sodium chloride 0.9 % bolus 1,000 mL (0 mLs Intravenous Stopped 12/04/18 1235)    Initial Impression / Assessment and Plan / UC Course  I have reviewed the triage vital signs and the nursing notes.  Pertinent labs & imaging results that were available during my care of the patient were reviewed by me and considered in my medical decision making (see chart for details).     1.  Ulcerative colitis flareup: Continue current steroid dose Ciprofloxacin 500 mg twice daily for 7 days Metronidazole 500 mg 3 times daily for 7 days Patient is  advised to follow-up with primary gastroenterologist 1 L normal saline bolus given If patient's condition worsens he is advised to go to the ED for CT of the abdomen and possibly admission for in-hospital care. 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 Provider   ciprofloxacin (CIPRO) 500 MG tablet Take 1 tablet (500 mg total) by mouth every 12 (twelve) hours for 7 days. 10 tablet Dezaray Shibuya, Myrene Galas, MD   metroNIDAZOLE (FLAGYL) 500 MG tablet Take 1 tablet (500 mg total) by mouth 3 (three) times daily for 7 days. 14 tablet Vernis [place]acun[place], Myrene Galas, MD     PDMP not reviewed this encounter.   Chase Picket, MD 12/04/18 [phone]  Chase Picket, MD 12/04/18 Drema Halon    Chase Picket, MD 12/04/18 [phone]

## 2018-12-05 LAB — NOVEL CORONAVIRUS, NAA (HOSP ORDER, SEND-OUT TO REF LAB; TAT 18-24 HRS): SARS-CoV-2, NAA: NOT DETECTED

## 2018-12-06 ENCOUNTER — Encounter (HOSPITAL_COMMUNITY): Payer: Self-pay

## 2018-12-15 ENCOUNTER — Encounter: Payer: Self-pay | Admitting: Internal Medicine

## 2018-12-30 ENCOUNTER — Ambulatory Visit (INDEPENDENT_AMBULATORY_CARE_PROVIDER_SITE_OTHER): Payer: No Typology Code available for payment source | Admitting: *Deleted

## 2018-12-30 ENCOUNTER — Other Ambulatory Visit: Payer: Self-pay

## 2018-12-30 DIAGNOSIS — Z23 Encounter for immunization: Secondary | ICD-10-CM

## 2019-01-19 ENCOUNTER — Encounter: Payer: No Typology Code available for payment source | Admitting: Internal Medicine

## 2019-01-22 ENCOUNTER — Other Ambulatory Visit: Payer: Self-pay | Admitting: Internal Medicine

## 2019-01-22 DIAGNOSIS — F17200 Nicotine dependence, unspecified, uncomplicated: Secondary | ICD-10-CM

## 2019-01-25 NOTE — Telephone Encounter (Signed)
Refilled wellbutrin, ok to resume.

## 2019-01-25 NOTE — Telephone Encounter (Signed)
The original prescription was discontinued on 09/28/2018 by Disher, Ludger Nutting, CMA for the following reason: Completed Course. Renewing this prescription may not be appropriate

## 2019-02-09 ENCOUNTER — Encounter: Payer: Self-pay | Admitting: Internal Medicine

## 2019-03-08 ENCOUNTER — Other Ambulatory Visit: Payer: Self-pay | Admitting: Internal Medicine

## 2019-03-08 DIAGNOSIS — E118 Type 2 diabetes mellitus with unspecified complications: Secondary | ICD-10-CM

## 2019-03-08 DIAGNOSIS — I10 Essential (primary) hypertension: Secondary | ICD-10-CM

## 2019-03-23 ENCOUNTER — Encounter: Payer: Self-pay | Admitting: Internal Medicine

## 2019-03-23 ENCOUNTER — Ambulatory Visit (HOSPITAL_COMMUNITY)
Admission: RE | Admit: 2019-03-23 | Discharge: 2019-03-23 | Disposition: A | Discharge status: Home / Self Care | Payer: Self-pay | Source: Ambulatory Visit | Attending: Internal Medicine | Admitting: Internal Medicine

## 2019-03-23 ENCOUNTER — Other Ambulatory Visit: Payer: Self-pay

## 2019-03-23 ENCOUNTER — Ambulatory Visit: Payer: Self-pay | Admitting: Internal Medicine

## 2019-03-23 VITALS — BP 148/83 | HR 74 | Temp 98.4°F | Ht 63.0 in | Wt 248.0 lb

## 2019-03-23 DIAGNOSIS — I152 Hypertension secondary to endocrine disorders: Secondary | ICD-10-CM

## 2019-03-23 DIAGNOSIS — T161XXA Foreign body in right ear, initial encounter: Secondary | ICD-10-CM

## 2019-03-23 DIAGNOSIS — R05 Cough: Secondary | ICD-10-CM

## 2019-03-23 DIAGNOSIS — E038 Other specified hypothyroidism: Secondary | ICD-10-CM

## 2019-03-23 DIAGNOSIS — K580 Irritable bowel syndrome with diarrhea: Secondary | ICD-10-CM

## 2019-03-23 DIAGNOSIS — L409 Psoriasis, unspecified: Secondary | ICD-10-CM

## 2019-03-23 DIAGNOSIS — F17210 Nicotine dependence, cigarettes, uncomplicated: Secondary | ICD-10-CM

## 2019-03-23 DIAGNOSIS — Z79899 Other long term (current) drug therapy: Secondary | ICD-10-CM

## 2019-03-23 DIAGNOSIS — F17200 Nicotine dependence, unspecified, uncomplicated: Secondary | ICD-10-CM

## 2019-03-23 DIAGNOSIS — R059 Cough, unspecified: Secondary | ICD-10-CM

## 2019-03-23 DIAGNOSIS — I10 Essential (primary) hypertension: Secondary | ICD-10-CM

## 2019-03-23 DIAGNOSIS — K529 Noninfective gastroenteritis and colitis, unspecified: Secondary | ICD-10-CM

## 2019-03-23 DIAGNOSIS — X58XXXA Exposure to other specified factors, initial encounter: Secondary | ICD-10-CM

## 2019-03-23 DIAGNOSIS — K51919 Ulcerative colitis, unspecified with unspecified complications: Secondary | ICD-10-CM

## 2019-03-23 DIAGNOSIS — Z79890 Hormone replacement therapy (postmenopausal): Secondary | ICD-10-CM

## 2019-03-23 DIAGNOSIS — E118 Type 2 diabetes mellitus with unspecified complications: Secondary | ICD-10-CM

## 2019-03-23 DIAGNOSIS — Z7984 Long term (current) use of oral hypoglycemic drugs: Secondary | ICD-10-CM

## 2019-03-23 DIAGNOSIS — E039 Hypothyroidism, unspecified: Secondary | ICD-10-CM

## 2019-03-23 DIAGNOSIS — H9201 Otalgia, right ear: Secondary | ICD-10-CM | POA: Insufficient documentation

## 2019-03-23 DIAGNOSIS — R61 Generalized hyperhidrosis: Secondary | ICD-10-CM

## 2019-03-23 DIAGNOSIS — E1159 Type 2 diabetes mellitus with other circulatory complications: Secondary | ICD-10-CM

## 2019-03-23 LAB — POCT GLYCOSYLATED HEMOGLOBIN (HGB A1C): Hemoglobin A1C: 6.9 % — AB (ref 4.0–5.6)

## 2019-03-23 LAB — GLUCOSE, CAPILLARY: Glucose-Capillary: 149 mg/dL — ABNORMAL HIGH (ref 70–99)

## 2019-03-23 MED ORDER — LANCETS MISC: 1.0000 [IU] | Freq: Every day | 1 refills | Status: DC

## 2019-03-23 MED ORDER — LOSARTAN POTASSIUM 50 MG PO TABS: 50.0000 mg | ORAL_TABLET | Freq: Every day | ORAL | 3 refills | Status: DC

## 2019-03-23 MED ORDER — LEVOTHYROXINE SODIUM 150 MCG PO TABS: 150.0000 ug | ORAL_TABLET | Freq: Every day | ORAL | 3 refills | Status: DC

## 2019-03-23 MED ORDER — LANCETS MISC: 1.0000 [IU] | Freq: Every day | 0 refills | Status: DC

## 2019-03-23 MED ORDER — GLIPIZIDE 5 MG PO TABS: 5.0000 mg | ORAL_TABLET | Freq: Every day | ORAL | 1 refills | Status: DC

## 2019-03-23 MED ORDER — CONTOUR TEST VI STRP: ORAL_STRIP | 12 refills | Status: DC

## 2019-03-23 MED ORDER — BUPROPION HCL ER (SR) 150 MG PO TB12: 150.0000 mg | ORAL_TABLET | Freq: Two times a day (BID) | ORAL | 11 refills | Status: DC

## 2019-03-23 NOTE — Patient Instructions (Addendum)
Ms. Littrell,  Please call [name] to schedule a follow up appointment.   For your diabetes, continue to take metformin as previously prescribed. I have decreased your glipizide to 5 mg.  For your blood pressure, continue to take metoprolol. Please take 50 mg of losartan once a day. I have sent new prescriptions to your pharmacy.  I am checking an xray of your chest for your cough. I will call you when I get these results.  I have also placed an urgent referral for you to see the ear, nose, and throat doctor. You will be called to schedule this appointment.   Follow up with me again in 6 months.  If you have any questions or concerns, call our clinic at [phone] or after hours call [phone] and ask for the internal medicine resident on call.   Thank you!  Dr. Philipp Ovens

## 2019-03-24 ENCOUNTER — Encounter: Payer: Self-pay | Admitting: Internal Medicine

## 2019-03-24 LAB — CBC
Hematocrit: 42.4 % (ref 34.0–46.6)
Hemoglobin: 14.1 g/dL (ref 11.1–15.9)
MCH: 29.9 pg (ref 26.6–33.0)
MCHC: 33.3 g/dL (ref 31.5–35.7)
MCV: 90 fL (ref 79–97)
Platelets: 242 10*3/uL (ref 150–450)
RBC: 4.72 x10E6/uL (ref 3.77–5.28)
RDW: 14 % (ref 11.7–15.4)
WBC: 9 10*3/uL (ref 3.4–10.8)

## 2019-03-24 NOTE — Progress Notes (Signed)
Subjective:   Patient ID: Kendra Adams female   DOB: 10-20-1960 59 y.o.   MRN: [id number]  HPI: [name] is a 59 y.o. female with past medical history outlined below here for HTN and DM follow up. For the details of today's visit, please refer to the assessment and plan.   Past Medical History:  Diagnosis Date  . Allergic rhinitis   . Asthma   . Atopic dermatitis   . CAD (coronary artery disease)    a. 12/13/10: mRCA 99%, EF 65-70%;  s/p BMS to mRCA  . COPD (chronic obstructive pulmonary disease) ([place])   . Depression   . DM2 (diabetes mellitus, type 2) ([place])   . GERD (gastroesophageal reflux disease)   . GI bleed   . HTN (hypertension)   . Hyperlipidemia   . Hyperlipidemia   . Hypothyroidism   . Psoriasis   . Seasonal allergies   . Tobacco abuse    Current Outpatient Medications  Medication Sig Dispense Refill  . albuterol (PROVENTIL HFA;VENTOLIN HFA) 108 (90 Base) MCG/ACT inhaler Inhale 1-2 puffs into the lungs every 6 (six) hours as needed for wheezing or shortness of breath. 1 Inhaler 0  . aspirin 81 MG chewable tablet Chew 81 mg by mouth every morning.     Marland Kitchen atorvastatin (LIPITOR) 80 MG tablet Take 1 tablet by mouth once daily 90 tablet 0  . betamethasone dipropionate (DIPROLENE) 0.05 % cream Apply topically 2 (two) times daily. (Patient taking differently: Apply 1 application topically 2 (two) times daily as needed (skin care). ) 45 g 2  . buPROPion (WELLBUTRIN SR) 150 MG 12 hr tablet Take 1 tablet (150 mg total) by mouth 2 (two) times daily. 60 tablet 11  . dicyclomine (BENTYL) 10 MG capsule Take 1 capsule (10 mg total) by mouth 3 (three) times daily before meals. IM Program 90 capsule 0  . ferrous sulfate 325 (65 FE) MG EC tablet Take 1 tablet (325 mg total) by mouth daily with breakfast. 30 tablet 11  . fluticasone (FLONASE) 50 MCG/ACT nasal spray Place 2 sprays into both nostrils daily. 16 g 2  . glipiZIDE (GLUCOTROL) 5 MG tablet Take 1 tablet (5 mg  total) by mouth daily. 90 tablet 1  . glucose blood (CONTOUR TEST) test strip Use as instructed 100 each 12  . Lancets MISC 1 Units by Does not apply route daily. 100 each 1  . levothyroxine (SYNTHROID) 150 MCG tablet Take 1 tablet (150 mcg total) by mouth daily before breakfast. 90 tablet 3  . loratadine (CLARITIN) 10 MG tablet Take 1 tablet (10 mg total) by mouth daily. 30 tablet 2  . losartan (COZAAR) 50 MG tablet Take 1 tablet (50 mg total) by mouth at bedtime. 90 tablet 3  . metFORMIN (GLUCOPHAGE) 1000 MG tablet Take 1 tablet (1,000 mg total) by mouth 2 (two) times daily. 60 tablet 11  . metoprolol succinate (TOPROL-XL) 100 MG 24 hr tablet TAKE 1 TABLET BY MOUTH ONCE DAILY WITH  OR  IMMEDIATELY  FOLLOWING  A  MEAL 90 tablet 3  . nitroGLYCERIN (NITROSTAT) 0.4 MG SL tablet Place 1 tablet (0.4 mg total) under the tongue every 5 (five) minutes as needed for chest pain. 25 tablet 3  . Omega-3 Fatty Acids (FISH OIL) 1000 MG CAPS Take 1 capsule by mouth every morning.     Marland Kitchen omeprazole (PRILOSEC) 40 MG capsule Take 1 capsule (40 mg total) by mouth daily. 90 capsule 3  . ondansetron (ZOFRAN) 4 MG  tablet TAKE 1 TABLET (4 MG TOTAL) BY MOUTH EVERY 6 (SIX) HOURS AS NEEDED FOR NAUSEA. 20 tablet 0  . rOPINIRole (REQUIP) 1 MG tablet Take 1 tablet (1 mg total) by mouth at bedtime. 90 tablet 3  . sertraline (ZOLOFT) 100 MG tablet Take 1.5 tablets (150 mg total) by mouth daily. 45 tablet 11  . Spacer/Aero-Holding Chambers (AEROCHAMBER PLUS) inhaler Use as instructed 1 each 2  . triamcinolone cream (KENALOG) 0.1 % Apply 1 application topically 2 (two) times daily. 80 g 3   No current facility-administered medications for this visit.   Family History  Problem Relation Age of Onset  . Diabetes Mother   . Hypertension Mother    Social History   Socioeconomic History  . Marital status: Married    Spouse name: Not on file  . Number of children: 0  . Years of education: 10th grade  . Highest education  level: Not on file  Occupational History  . Occupation: Conservation officer, [employer]    Comment: self-employed  . 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 Smoking status: Current Every Day Smoker    Packs/day: 0.30    Years: 40.00    Pack years: 12.00    Types: Cigarettes    Last attempt to quit: 05/19/2014    Years since quitting: 4.8  . Smokeless tobacco: Never Used  Substance and Sexual Activity  . Alcohol use: No    Alcohol/week: 0.0 standard drinks  . Drug use: Yes    Frequency: 7.0 times per week    Types: Marijuana    Comment: Marijuana Use  . Sexual activity: Not on file  Other Topics Concern  . Not on file  Social History Narrative   Father died of suicide (gunshot) in 40. 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       Patient lives with her husband, her husband's niece Kendra Adams who is mentally retarded and her best friend and the friend's daughter and 6 dogs               Social Determinants of Health   Financial Resource Strain:   . Difficulty of Paying Living Expenses: Not on file  Food Insecurity:   . Worried About Charity fundraiser in the Last Year: Not on file  . Ran Out of Food in the Last Year: Not on file  Transportation Needs:   . Lack of Transportation (Medical): Not on file  . Lack of Transportation (Non-Medical): Not on file  Physical Activity:   . Days of Exercise per Week: Not on file  . Minutes of Exercise per Session: Not on file  Stress:   . Feeling of Stress : Not on file  Social Connections:   . Frequency of Communication with Friends and Family: Not on file  . Frequency of Social Gatherings with Friends and Family: Not on file  . Attends Religious Services: Not on file  . Active Member of Clubs or Organizations: Not on file  . Attends Archivist Meetings: Not on file  . Marital Status: Not on file    Review of  Systems: Review of Systems  Respiratory: Positive for cough. Negative for shortness of breath.   Cardiovascular: Negative for chest pain.     Objective:  Physical Exam:  Vitals:   03/23/19 0955  BP: (!) 148/83  Pulse: 74  Temp: 98.4 F (36.9 C)  TempSrc: Oral  SpO2: 97%  Weight: 248 lb (112.5 kg)  Height: 5' 3"  (1.6 m)    Physical Exam  Constitutional: She is oriented to person, place, and time and well-developed, well-nourished, and in no distress. No distress.  Cardiovascular: Normal rate, regular rhythm and normal heart sounds.  No murmur heard. Pulmonary/Chest: No respiratory distress. She has no wheezes.  Mild rhonchi   Abdominal: Soft. She exhibits no distension.  Neurological: She is alert and oriented to person, place, and time. No cranial nerve deficit.  Skin: She is diaphoretic.     Assessment & Plan:   See Encounters Tab for problem based charting.

## 2019-03-24 NOTE — Assessment & Plan Note (Signed)
Refilled wellbutrin.

## 2019-03-24 NOTE — Assessment & Plan Note (Signed)
Patient reports ear discomfort and thinks she has something stuck in her ear. She was playing with her grandkids when the plastic beads of a stuffed animal spilled out and she thinks got stuck inside her ear. On exam she has two small foreign bodies in her external ear canal. Tympanic membrane is intact without erythema.  -- Urgent referral to ENT

## 2019-03-24 NOTE — Assessment & Plan Note (Signed)
At her last visit, patient was complaining of dizziness and lightheadedness that she felt was consistent with hypoglycemia. Symptoms resolved after eating. I recommended patient decrease her glipizide and check her blood sugar when these symptoms occur. Patient has done neither since her last visit. She continues to have these symptoms but does not check her blood sugar and has continued to take 10 mg of glipizide. Today I instructed patient to decrease her glipizide to 5 mg, continue metformin 1000 mg BID, and check her CBG at least once a day but especially when she has symptoms. She expressed understanding. Send new prescription to her pharmacy. Refilled lancets and test strips.

## 2019-03-24 NOTE — Assessment & Plan Note (Signed)
Patient has not followed up with GI. She was approved for humira but expresses concern that this will worsen her psoriasis. Her BM have improved but are not back to normal. She continues to have multiple episodes of loose stool without blood. Repeat CBC today has improved, now within normal limits. Encouraged patient to follow up with GI, and discuss her concerns with them. Also offered dermatology referral if needed.

## 2019-03-24 NOTE — Progress Notes (Signed)
Attempted to call patient x 2 with CXR and lab results, unable to leave voicemail.

## 2019-03-24 NOTE — Assessment & Plan Note (Signed)
Patient is complaining of cough, feels like she has "bronchitis". On exam she is saturating 97% on RA, normal effort, with some upper airway rhonchi. CXR obtained was negative for infiltrate or consolidation. Recommended supportive care for now. If symptoms persist could consider abx treatment for bronchitis.   Attempted to call patient with CXR results, no answer.

## 2019-03-24 NOTE — Assessment & Plan Note (Signed)
Uncontrolled today. Patient is currently taking metoprolol 100 mg daily but has not been taking her losartan. At her last visit she was instructed to increase her losartan back to 100 mg daily; previously decreased to due hypotension in the setting of acute blood loss anemia. Instead she discontinued the losartan altogether. Instructed patient to resume losartan at 50 mg daily; prescription sent to her pharmacy. Follow up 3 months.

## 2019-03-24 NOTE — Assessment & Plan Note (Signed)
Last TSH on 12/08/18 within normal range. Continue Synthroid 150 mcg daily, refill sent to pharmacy.

## 2019-03-29 ENCOUNTER — Telehealth: Payer: Self-pay | Admitting: *Deleted

## 2019-03-29 DIAGNOSIS — E118 Type 2 diabetes mellitus with unspecified complications: Secondary | ICD-10-CM

## 2019-03-29 MED ORDER — ACCU-CHEK GUIDE ME W/DEVICE KIT: 1.0000 | PACK | Freq: Once | 0 refills | Status: AC

## 2019-03-29 MED ORDER — MICROLET LANCETS MISC: 1 refills | Status: DC

## 2019-03-29 NOTE — Telephone Encounter (Signed)
Orders placed. Thank you.

## 2019-03-29 NOTE — Telephone Encounter (Addendum)
Received note from pharmacy [facility]) with the following message: " Patient would like to switch to Accu Chek Guide meter and suppies due to cost.   Please advise w/new rx for Accu-chek Guide Meter and supplies. Thank you"   Also received a separate refill request for Microlet Lancets #100-use to check glucose daily   Will send message to pcp for review, please advise    CC: [name] Plyler

## 2019-04-11 ENCOUNTER — Encounter (HOSPITAL_COMMUNITY): Payer: Self-pay

## 2019-04-25 NOTE — Addendum Note (Signed)
Addended by: Hulan Fray on: 04/25/2019 05:01 PM   Modules accepted: Orders

## 2019-05-26 ENCOUNTER — Ambulatory Visit: Payer: Self-pay | Attending: Internal Medicine

## 2019-05-26 DIAGNOSIS — Z23 Encounter for immunization: Secondary | ICD-10-CM

## 2019-05-26 NOTE — Progress Notes (Signed)
   Covid-19 Vaccination Clinic  Name:  Kendra Adams    MRN: [id number] DOB: 03-24-1960  05/26/2019  [name] was observed post Covid-19 immunization for 15 minutes without incident. She was provided with Vaccine Information Sheet and instruction to access the V-Safe system.   [name] was instructed to call 911 with any severe reactions post vaccine: Marland Kitchen Difficulty breathing  . Swelling of face and throat  . A fast heartbeat  . A bad rash all over body  . Dizziness and weakness   Immunizations Administered    Name Date Dose VIS Date Route   Pfizer COVID-19 Vaccine 05/26/2019  8:38 AM 0.3 mL 02/18/2019 Intramuscular   Manufacturer: [place]   Lot: [id number]   [place]: [address]-1

## 2019-06-17 ENCOUNTER — Other Ambulatory Visit: Payer: Self-pay | Admitting: Student in an Organized Health Care Education/Training Program

## 2019-06-17 DIAGNOSIS — I10 Essential (primary) hypertension: Secondary | ICD-10-CM

## 2019-06-20 ENCOUNTER — Ambulatory Visit: Payer: Self-pay | Attending: Internal Medicine

## 2019-06-20 DIAGNOSIS — Z23 Encounter for immunization: Secondary | ICD-10-CM

## 2019-06-20 NOTE — Progress Notes (Signed)
   Covid-19 Vaccination Clinic  Name:  Kendra Adams    MRN: [id number] DOB: 1960/04/26  06/20/2019  Kendra Adams was observed post Covid-19 immunization for 15 minutes without incident. She was provided with Vaccine Information Sheet and instruction to access the V-Safe system.   Kendra Adams was instructed to call 911 with any severe reactions post vaccine: Marland Kitchen Difficulty breathing  . Swelling of face and throat  . A fast heartbeat  . A bad rash all over body  . Dizziness and weakness   Immunizations Administered    Name Date Dose VIS Date Route   Pfizer COVID-19 Vaccine 06/20/2019  8:39 AM 0.3 mL 02/18/2019 Intramuscular   Manufacturer: [place]   Lot: [id number]   [place]: [id number]

## 2019-07-02 ENCOUNTER — Encounter: Payer: Self-pay | Admitting: Internal Medicine

## 2019-07-09 ENCOUNTER — Other Ambulatory Visit: Payer: Self-pay | Admitting: Cardiology

## 2019-07-09 ENCOUNTER — Other Ambulatory Visit: Payer: Self-pay | Admitting: Student in an Organized Health Care Education/Training Program

## 2019-07-09 DIAGNOSIS — E1159 Type 2 diabetes mellitus with other circulatory complications: Secondary | ICD-10-CM

## 2019-07-09 DIAGNOSIS — I152 Hypertension secondary to endocrine disorders: Secondary | ICD-10-CM

## 2019-07-09 DIAGNOSIS — E118 Type 2 diabetes mellitus with unspecified complications: Secondary | ICD-10-CM

## 2019-07-09 DIAGNOSIS — I10 Essential (primary) hypertension: Secondary | ICD-10-CM

## 2019-07-23 ENCOUNTER — Other Ambulatory Visit: Payer: Self-pay | Admitting: Internal Medicine

## 2019-07-23 ENCOUNTER — Other Ambulatory Visit: Payer: Self-pay | Admitting: Cardiology

## 2019-07-23 DIAGNOSIS — I152 Hypertension secondary to endocrine disorders: Secondary | ICD-10-CM

## 2019-07-23 DIAGNOSIS — Z8669 Personal history of other diseases of the nervous system and sense organs: Secondary | ICD-10-CM

## 2019-07-23 DIAGNOSIS — E1159 Type 2 diabetes mellitus with other circulatory complications: Secondary | ICD-10-CM

## 2019-08-06 ENCOUNTER — Other Ambulatory Visit: Payer: Self-pay | Admitting: Cardiology

## 2019-08-06 DIAGNOSIS — E1159 Type 2 diabetes mellitus with other circulatory complications: Secondary | ICD-10-CM

## 2019-08-06 DIAGNOSIS — I152 Hypertension secondary to endocrine disorders: Secondary | ICD-10-CM

## 2019-08-10 ENCOUNTER — Other Ambulatory Visit: Payer: Self-pay | Admitting: *Deleted

## 2019-08-10 DIAGNOSIS — I152 Hypertension secondary to endocrine disorders: Secondary | ICD-10-CM

## 2019-08-10 DIAGNOSIS — E1159 Type 2 diabetes mellitus with other circulatory complications: Secondary | ICD-10-CM

## 2019-08-11 MED ORDER — LOSARTAN POTASSIUM 50 MG PO TABS: 50.0000 mg | ORAL_TABLET | Freq: Every day | ORAL | 3 refills | Status: DC

## 2019-09-16 ENCOUNTER — Other Ambulatory Visit: Payer: Self-pay | Admitting: Internal Medicine

## 2019-09-16 DIAGNOSIS — I10 Essential (primary) hypertension: Secondary | ICD-10-CM

## 2019-09-18 ENCOUNTER — Ambulatory Visit (HOSPITAL_COMMUNITY)
Admission: EM | Admit: 2019-09-18 | Discharge: 2019-09-18 | Disposition: A | Discharge status: Home / Self Care | Payer: HRSA Program | Attending: Emergency Medicine | Admitting: Emergency Medicine

## 2019-09-18 ENCOUNTER — Other Ambulatory Visit: Payer: Self-pay

## 2019-09-18 ENCOUNTER — Ambulatory Visit (INDEPENDENT_AMBULATORY_CARE_PROVIDER_SITE_OTHER): Payer: HRSA Program

## 2019-09-18 ENCOUNTER — Encounter (HOSPITAL_COMMUNITY): Payer: Self-pay

## 2019-09-18 DIAGNOSIS — K219 Gastro-esophageal reflux disease without esophagitis: Secondary | ICD-10-CM | POA: Insufficient documentation

## 2019-09-18 DIAGNOSIS — E039 Hypothyroidism, unspecified: Secondary | ICD-10-CM | POA: Diagnosis not present

## 2019-09-18 DIAGNOSIS — R062 Wheezing: Secondary | ICD-10-CM

## 2019-09-18 DIAGNOSIS — I10 Essential (primary) hypertension: Secondary | ICD-10-CM | POA: Diagnosis not present

## 2019-09-18 DIAGNOSIS — Z20822 Contact with and (suspected) exposure to covid-19: Secondary | ICD-10-CM | POA: Insufficient documentation

## 2019-09-18 DIAGNOSIS — R519 Headache, unspecified: Secondary | ICD-10-CM | POA: Diagnosis not present

## 2019-09-18 DIAGNOSIS — R0683 Snoring: Secondary | ICD-10-CM | POA: Insufficient documentation

## 2019-09-18 DIAGNOSIS — J441 Chronic obstructive pulmonary disease with (acute) exacerbation: Secondary | ICD-10-CM | POA: Insufficient documentation

## 2019-09-18 DIAGNOSIS — R05 Cough: Secondary | ICD-10-CM

## 2019-09-18 DIAGNOSIS — J3489 Other specified disorders of nose and nasal sinuses: Secondary | ICD-10-CM

## 2019-09-18 DIAGNOSIS — I2510 Atherosclerotic heart disease of native coronary artery without angina pectoris: Secondary | ICD-10-CM | POA: Insufficient documentation

## 2019-09-18 DIAGNOSIS — R0981 Nasal congestion: Secondary | ICD-10-CM | POA: Diagnosis not present

## 2019-09-18 DIAGNOSIS — Z7982 Long term (current) use of aspirin: Secondary | ICD-10-CM | POA: Diagnosis not present

## 2019-09-18 DIAGNOSIS — E1159 Type 2 diabetes mellitus with other circulatory complications: Secondary | ICD-10-CM | POA: Diagnosis not present

## 2019-09-18 DIAGNOSIS — F17210 Nicotine dependence, cigarettes, uncomplicated: Secondary | ICD-10-CM | POA: Diagnosis not present

## 2019-09-18 DIAGNOSIS — F329 Major depressive disorder, single episode, unspecified: Secondary | ICD-10-CM | POA: Diagnosis not present

## 2019-09-18 DIAGNOSIS — E785 Hyperlipidemia, unspecified: Secondary | ICD-10-CM | POA: Diagnosis not present

## 2019-09-18 DIAGNOSIS — Z955 Presence of coronary angioplasty implant and graft: Secondary | ICD-10-CM | POA: Insufficient documentation

## 2019-09-18 DIAGNOSIS — F17200 Nicotine dependence, unspecified, uncomplicated: Secondary | ICD-10-CM

## 2019-09-18 DIAGNOSIS — J029 Acute pharyngitis, unspecified: Secondary | ICD-10-CM | POA: Insufficient documentation

## 2019-09-18 DIAGNOSIS — Z7984 Long term (current) use of oral hypoglycemic drugs: Secondary | ICD-10-CM | POA: Diagnosis not present

## 2019-09-18 DIAGNOSIS — Z79899 Other long term (current) drug therapy: Secondary | ICD-10-CM | POA: Diagnosis not present

## 2019-09-18 DIAGNOSIS — I700 Atherosclerosis of aorta: Secondary | ICD-10-CM | POA: Diagnosis not present

## 2019-09-18 DIAGNOSIS — R0602 Shortness of breath: Secondary | ICD-10-CM

## 2019-09-18 MED ORDER — AZITHROMYCIN 250 MG PO TABS: ORAL_TABLET | ORAL | 0 refills | Status: AC

## 2019-09-18 MED ORDER — PREDNISONE 20 MG PO TABS: 40.0000 mg | ORAL_TABLET | Freq: Every day | ORAL | 0 refills | Status: AC

## 2019-09-18 NOTE — Discharge Instructions (Signed)
Your xray looks normal today which is reassuring.  Complete course of antibiotics.  Complete 5 days of steroids as well.  Please follow up with your PCP as you may need recheck and further treatment or referral if persistent.

## 2019-09-18 NOTE — ED Triage Notes (Signed)
Pt c/o sore throat, congestion, sinus drainage with yellow drainage from nose, productive cough with yellow sputum and mild SOB for approx 1 month. Reports diminished/change in taste and increasing fatigue/malaise the past couple days.  Has been taking Mucinex w/o much improvement.  Pt states it has become increasingly harder to swallow and has had difficulty swallowing food,k also becoming hoarse.   Denies abdominal pain, n/v/d, chills.   Pt reports that her psoriasis is starting to exacerbate which usually happens with other illness. Bilat lungs slightly diminished sounds bases. Throat slightly red.

## 2019-09-19 LAB — SARS CORONAVIRUS 2 (TAT 6-24 HRS): SARS Coronavirus 2: NEGATIVE

## 2019-09-19 NOTE — ED Provider Notes (Signed)
[name]    CSN: [id number] Arrival date & time: 09/18/19  1657      History   Chief Complaint Chief Complaint  Patient presents with  . Sore Throat  . Nasal Congestion    HPI Kendra Adams is a 59 y.o. female.   Kendra Adams presents with complaints of 1 month of mucus, congestion, productive cough, "knot" in her throat, and rawness to throat from coughing. Slight headache. Thick mucus production. Hoarse voice from coughing. No fevers. History of COPD, states she gets frequent URI's. Some shortness of breath , worse at night. States she does snore, no known OSA, denies any testing for this. Uses inhalers occasionally but hasn't necessarily helped. No known ill contacts. She is vaccinated for covid 19. No lower extremity edema. Smokes approximately 4 cigarettes a day   ROS per HPI, negative if not otherwise mentioned.      Past Medical History:  Diagnosis Date  . Allergic rhinitis   . Asthma   . Atopic dermatitis   . CAD (coronary artery disease)    a. 12/13/10: mRCA 99%, EF 65-70%;  s/p BMS to mRCA  . COPD (chronic obstructive pulmonary disease) ([place])   . Depression   . DM2 (diabetes mellitus, type 2) ([place])   . GERD (gastroesophageal reflux disease)   . GI bleed   . HTN (hypertension)   . Hyperlipidemia   . Hyperlipidemia   . Hypothyroidism   . Psoriasis   . Seasonal allergies   . Tobacco abuse     Patient Active Problem List   Diagnosis Date Noted  . Ear pain, right 03/23/2019  . Need for vaccination against hepatitis B virus 12/02/2018  . Hypocalcemia 12/02/2018  . Olecranon bursitis of left elbow 12/02/2018  . Preoperative clearance 09/27/2018  . Inflammatory bowel disease 09/22/2018  . Anemia 09/22/2018  . Aortic atherosclerosis ([place]) 09/12/2018  . Bilateral hip pain 05/27/2017  . Dyspareunia, female 04/06/2017  . Atopic dermatitis 01/01/2017  . History of restless legs syndrome 11/24/2016  . 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 Psoriasis 10/15/2015  . Elevated transaminase level 08/12/2015  . Tendinitis of left rotator cuff 01/19/2015  . Tobacco use disorder 09/07/2014  . Dysuria 01/07/2013  . Morbid obesity ([place]) 03/19/2011  . Cough 03/17/2011  . Routine health maintenance 02/03/2011  . CAD (coronary artery disease) 12/26/2010  . Diabetes mellitus type 2 with complications ([place]) 38/12/1749  . Hypertension associated with diabetes ([place]) 05/10/2008  . Hypothyroidism 04/27/2008  . HLD (hyperlipidemia) 04/27/2008  . Depression 04/27/2008  . GERD 04/27/2008    Past Surgical History:  Procedure Laterality Date  . BIOPSY  10/28/2018   Procedure: BIOPSY;  Surgeon: Juanita Craver, MD;  Location: [facility];  Service: Endoscopy;;  . CARDIAC CATHETERIZATION N/A 08/14/2015   Procedure: Left Heart Cath and Coronary Angiography;  Surgeon: Peter M Martinique, MD;  Location: [facility];  Service: Cardiovascular;  Laterality: N/A;  . CARPAL TUNNEL RELEASE     w/ bone spurs  . CHOLECYSTECTOMY N/A 08/15/2015   Procedure: LAPAROSCOPIC CHOLECYSTECTOMY WITH ATTEMPTED INTRAOPERATIVE CHOLANGIOGRAM;  Surgeon: Erroll Luna, MD;  Location: [facility];  Service: General;  Laterality: N/A;  . COLONOSCOPY WITH PROPOFOL N/A 10/28/2018   Procedure: COLONOSCOPY WITH PROPOFOL;  Surgeon: Juanita Craver, MD;  Location: [facility];  Service: Endoscopy;  Laterality: N/A;  . CORONARY ANGIOPLASTY WITH STENT PLACEMENT    . DILATION AND CURETTAGE OF UTERUS      OB History  Gravida  2   Para      Term      Preterm      AB  2   Living        SAB  1   TAB  1   Ectopic      Multiple      Live Births               Home Medications    Prior to Admission medications   Medication Sig Start Date End Date Taking? Authorizing Provider  albuterol (PROVENTIL HFA;VENTOLIN HFA) 108 (90 Base) MCG/ACT inhaler Inhale 1-2 puffs into the lungs every 6 (six) hours as needed for wheezing or shortness of  breath. 01/01/18  Yes [name], MD  aspirin 81 MG chewable tablet Chew 81 mg by mouth every morning.    Yes [provider]  atorvastatin (LIPITOR) 80 MG tablet Take 1 tablet by mouth once daily 09/16/19  Yes [name], MD  glipiZIDE (GLUCOTROL) 5 MG tablet Take 1 tablet (5 mg total) by mouth daily. 03/23/19  Yes [name], MD  levothyroxine (SYNTHROID) 150 MCG tablet Take 1 tablet (150 mcg total) by mouth daily before breakfast. 03/23/19  Yes [name], MD  losartan (COZAAR) 50 MG tablet Take 1 tablet (50 mg total) by mouth at bedtime. 08/11/19  Yes [name], MD  metFORMIN (GLUCOPHAGE) 1000 MG tablet Take 1 tablet (1,000 mg total) by mouth 2 (two) times daily. 09/02/18  Yes [name], MD  metoprolol succinate (TOPROL-XL) 100 MG 24 hr tablet TAKE 1 TABLET BY MOUTH ONCE DAILY WITH  OR  IMMEDIATELY  FOLLOWING  A  MEAL 11/30/18  Yes Dorothy Spark, MD  azithromycin (ZITHROMAX) 250 MG tablet Take 2 tablets (500 mg total) by mouth daily for 1 day, THEN 1 tablet (250 mg total) daily for 4 days. 09/18/19 09/23/19  Zigmund Gottron, NP  betamethasone dipropionate (DIPROLENE) 0.05 % cream Apply topically 2 (two) times daily. Patient taking differently: Apply 1 application topically 2 (two) times daily as needed (skin care).  02/15/18   [name], MD  buPROPion (WELLBUTRIN SR) 150 MG 12 hr tablet Take 1 tablet (150 mg total) by mouth 2 (two) times daily. 03/23/19   [name], MD  dicyclomine (BENTYL) 10 MG capsule Take 1 capsule (10 mg total) by mouth 3 (three) times daily before meals. IM Program 09/15/18   Al Decant, MD  ferrous sulfate 325 (65 FE) MG EC tablet Take 1 tablet (325 mg total) by mouth daily with breakfast. 10/18/18 10/18/19  Jean Rosenthal, MD  fluticasone (FLONASE) 50 MCG/ACT nasal spray Place 2 sprays into both nostrils daily. 02/24/17   Jule Ser, DO  glucose blood (CONTOUR TEST) test strip Use as instructed 03/23/19    [name], MD  loratadine (CLARITIN) 10 MG tablet Take 1 tablet (10 mg total) by mouth daily. 02/15/18   [name], MD  Microlet Lancets MISC Check CBG once daily 03/29/19   [name], MD  nitroGLYCERIN (NITROSTAT) 0.4 MG SL tablet Place 1 tablet (0.4 mg total) under the tongue every 5 (five) minutes as needed for chest pain. 09/28/18   [name], PA-C  Omega-3 Fatty Acids (FISH OIL) 1000 MG CAPS Take 1 capsule by mouth every morning.     [provider]  omeprazole (PRILOSEC) 40 MG capsule Take 1 capsule (40 mg total) by mouth daily. 11/01/18   Sid Falcon, MD  ondansetron (ZOFRAN) 4 MG tablet TAKE  1 TABLET (4 MG TOTAL) BY MOUTH EVERY 6 (SIX) HOURS AS NEEDED FOR NAUSEA. 11/23/18   [name], MD  predniSONE (DELTASONE) 20 MG tablet Take 2 tablets (40 mg total) by mouth daily with breakfast for 5 days. 09/18/19 09/23/19  Zigmund Gottron, NP  rOPINIRole (REQUIP) 1 MG tablet TAKE 1 TABLET BY MOUTH AT BEDTIME 07/26/19   [name], MD  sertraline (ZOLOFT) 100 MG tablet TAKE 1 & 1/2 (ONE & ONE-HALF) TABLETS BY MOUTH ONCE DAILY 09/16/19   [name], MD  Spacer/Aero-Holding Chambers (AEROCHAMBER PLUS) inhaler Use as instructed 02/03/15   Melynda Ripple, MD  triamcinolone cream (KENALOG) 0.1 % Apply 1 application topically 2 (two) times daily. 12/02/18   [name], MD    Family History Family History  Problem Relation Age of Onset  . Diabetes Mother   . Hypertension Mother     Social History Social History   Tobacco Use  . Smoking status: Current Every Day Smoker    Packs/day: 0.30    Years: 40.00    Pack years: 12.00    Types: Cigarettes    Last attempt to quit: 05/19/2014    Years since quitting: 5.3  . Smokeless tobacco: Never Used  Vaping Use  . Vaping Use: Some days  Substance Use Topics  . Alcohol use: No    Alcohol/week: 0.0 standard drinks  . Drug use: Yes    Frequency: 7.0 times per week    Types: Marijuana     Comment: Marijuana Use     Allergies   Gabapentin, Nsaids, Opium, and Tramadol hcl   Review of Systems Review of Systems   Physical Exam Triage Vital Signs ED Triage Vitals  Enc Vitals Group     BP 09/18/19 1753 127/84     Pulse Rate 09/18/19 1753 83     Resp 09/18/19 1753 18     Temp 09/18/19 1753 98.6 F (37 C)     Temp Source 09/18/19 1753 Oral     SpO2 09/18/19 1753 97 %     Weight --      Height --      Head Circumference --      Peak Flow --      Pain Score 09/18/19 1752 0     Pain Loc --      Pain Edu? --      Excl. in [place]? --    No data found.  Updated Vital Signs BP 127/84 (BP Location: Right Arm)   Pulse 83   Temp 98.6 F (37 C) (Oral)   Resp 18   SpO2 97%   Visual Acuity Right Eye Distance:   Left Eye Distance:   Bilateral Distance:    Right Eye Near:   Left Eye Near:    Bilateral Near:     Physical Exam Constitutional:      General: She is not in acute distress.    Appearance: She is well-developed.  HENT:     Head: Normocephalic and atraumatic.     Right Ear: Tympanic membrane, ear canal and external ear normal.     Left Ear: Tympanic membrane, ear canal and external ear normal.     Nose: Nose normal.     Mouth/Throat:     Pharynx: Uvula midline.     Tonsils: No tonsillar exudate.  Eyes:     Conjunctiva/sclera: Conjunctivae normal.     Pupils: Pupils are equal, round, and reactive to light.  Cardiovascular:     Rate  and Rhythm: Normal rate and regular rhythm.     Heart sounds: Normal heart sounds.  Pulmonary:     Effort: Pulmonary effort is normal.     Breath sounds: Examination of the right-lower field reveals decreased breath sounds. Examination of the left-lower field reveals decreased breath sounds. Decreased breath sounds present.  Lymphadenopathy:     Cervical: No cervical adenopathy.  Skin:    General: Skin is warm and dry.  Neurological:     Mental Status: She is alert and oriented to person, place, and time.       UC Treatments / Results  Labs (all labs ordered are listed, but only abnormal results are displayed) Labs Reviewed  SARS CORONAVIRUS 2 (TAT 6-24 HRS)    EKG   Radiology DG Chest 2 View  Result Date: 09/18/2019 CLINICAL DATA:  Tobacco abuse, short of breath, cough, wheezing EXAM: CHEST - 2 VIEW COMPARISON:  03/23/2019 FINDINGS: Frontal and lateral views of the chest demonstrate a stable cardiac silhouette. Chronic interstitial scarring, without airspace disease, effusion, or pneumothorax. No acute bony abnormalities. IMPRESSION: 1. No acute intrathoracic process. Electronically Signed   By: Randa Ngo M.D.   On: 09/18/2019 18:26    Procedures Procedures (including critical care time)  Medications Ordered in UC Medications - No data to display  Initial Impression / Assessment and Plan / UC Course  I have reviewed the triage vital signs and the nursing notes.  Pertinent labs & imaging results that were available during my care of the patient were reviewed by me and considered in my medical decision making (see chart for details).     Non toxic. Afebrile. No work of breathing. Long winded in conversation without difficulty. No cough through exam. Decreased lung sounds at bases with clear chest xray. Coverage for copd exacerbation provided. Encouraged follow up with PCP as may need further evaluation or treatment if symptoms persist. Patient verbalized understanding and agreeable to plan.   Final Clinical Impressions(s) / UC Diagnoses   Final diagnoses:  COPD exacerbation ([place])  Sinus drainage     Discharge Instructions     Your xray looks normal today which is reassuring.  Complete course of antibiotics.  Complete 5 days of steroids as well.  Please follow up with your PCP as you may need recheck and further treatment or referral if persistent.    ED Prescriptions    Medication Sig Dispense Auth. Provider   predniSONE (DELTASONE) 20 MG tablet Take 2 tablets (40 mg  total) by mouth daily with breakfast for 5 days. 10 tablet Augusto Gamble B, NP   azithromycin (ZITHROMAX) 250 MG tablet Take 2 tablets (500 mg total) by mouth daily for 1 day, THEN 1 tablet (250 mg total) daily for 4 days. 6 tablet Zigmund Gottron, NP     PDMP not reviewed this encounter.   Zigmund Gottron, NP 09/19/19 1135

## 2019-09-26 ENCOUNTER — Encounter: Payer: Self-pay | Admitting: Internal Medicine

## 2019-09-26 ENCOUNTER — Ambulatory Visit (INDEPENDENT_AMBULATORY_CARE_PROVIDER_SITE_OTHER): Payer: Self-pay | Admitting: Internal Medicine

## 2019-09-26 DIAGNOSIS — R0982 Postnasal drip: Secondary | ICD-10-CM

## 2019-09-26 MED ORDER — FLUTICASONE PROPIONATE 50 MCG/ACT NA SUSP: 2.0000 | Freq: Every day | NASAL | 2 refills | Status: AC

## 2019-09-26 MED ORDER — CETIRIZINE HCL 10 MG PO TABS: 10.0000 mg | ORAL_TABLET | Freq: Every day | ORAL | 2 refills | Status: DC

## 2019-09-26 NOTE — Patient Instructions (Signed)
Ms. Dorian, It was nice meeting you!   Today we discussed your sinus drainage and mucous.  I'd like you to start taking a different allergy pill every day, as well as flonase spray twice daily.  You can also try a humidifier at night to see if this helps.   Continue drinking plenty of fluids to keep your secretions thinned and do the best you can with avoiding smoking.   Take care, Dr. Koleen Distance

## 2019-09-27 ENCOUNTER — Encounter: Payer: Self-pay | Admitting: Internal Medicine

## 2019-09-27 DIAGNOSIS — R0982 Postnasal drip: Secondary | ICD-10-CM | POA: Insufficient documentation

## 2019-09-27 NOTE — Assessment & Plan Note (Signed)
This is a chronic problem, but she reports worsening symptoms the last 2 weeks. She presented to an urgent care and was prescribed Prednisone and Azithromycin which did not improve her symptoms. She denies any respiratory symptoms. Primarily complains of nasal drainage, sore throat and productive cough in the morning off of which are consistent with postnasal drip.   -start cetirizine daily -intranasal corticosteroid twice daily  -can try humidifier at night

## 2019-09-27 NOTE — Progress Notes (Signed)
Acute Office Visit  Subjective:    Patient ID: Kendra Adams, female    DOB: 1961/01/28, 59 y.o.   MRN: [id number]  Chief complaint: sinus drainage   HPI Patient is in today for chronic sinus drainage. Please see problem based charting for further details.   Past Medical History:  Diagnosis Date  . Allergic rhinitis   . Asthma   . Atopic dermatitis   . CAD (coronary artery disease)    a. 12/13/10: mRCA 99%, EF 65-70%;  s/p BMS to mRCA  . COPD (chronic obstructive pulmonary disease) ([place])   . Depression   . DM2 (diabetes mellitus, type 2) ([place])   . GERD (gastroesophageal reflux disease)   . GI bleed   . HTN (hypertension)   . Hyperlipidemia   . Hyperlipidemia   . Hypothyroidism   . Psoriasis   . Seasonal allergies   . Tobacco abuse     Past Surgical History:  Procedure Laterality Date  . BIOPSY  10/28/2018   Procedure: BIOPSY;  Surgeon: Juanita Craver, MD;  Location: [facility];  Service: Endoscopy;;  . CARDIAC CATHETERIZATION N/A 08/14/2015   Procedure: Left Heart Cath and Coronary Angiography;  Surgeon: Peter M Martinique, MD;  Location: [facility];  Service: Cardiovascular;  Laterality: N/A;  . CARPAL TUNNEL RELEASE     w/ bone spurs  . CHOLECYSTECTOMY N/A 08/15/2015   Procedure: LAPAROSCOPIC CHOLECYSTECTOMY WITH ATTEMPTED INTRAOPERATIVE CHOLANGIOGRAM;  Surgeon: Erroll Luna, MD;  Location: [facility];  Service: General;  Laterality: N/A;  . COLONOSCOPY WITH PROPOFOL N/A 10/28/2018   Procedure: COLONOSCOPY WITH PROPOFOL;  Surgeon: Juanita Craver, MD;  Location: [facility];  Service: Endoscopy;  Laterality: N/A;  . CORONARY ANGIOPLASTY WITH STENT PLACEMENT    . DILATION AND CURETTAGE OF UTERUS      Family History  Problem Relation Age of Onset  . Diabetes Mother   . Hypertension Mother     Social History   Socioeconomic History  . Marital status: Married    Spouse name: Not on file  . Number of children: 0  . Years of education: 10th grade  . Highest  education level: Not on file  Occupational History  . Occupation: Conservation officer, nature    Comment: self-employed  . 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 Smoking status: Current Every Day Smoker    Packs/day: 0.30    Years: 40.00    Pack years: 12.00    Types: Cigarettes    Last attempt to quit: 05/19/2014    Years since quitting: 5.3  . Smokeless tobacco: Never Used  Vaping Use  . Vaping Use: Some days  Substance and Sexual Activity  . Alcohol use: No    Alcohol/week: 0.0 standard drinks  . Drug use: Yes    Frequency: 7.0 times per week    Types: Marijuana    Comment: Marijuana Use  . Sexual activity: Not on file  Other Topics Concern  . Not on file  Social History Narrative   Father died of suicide (gunshot) in 10. 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       Patient lives with her husband, her husband's niece Tammy who is mentally retarded and her best friend and the friend's daughter and 6 dogs  Social Determinants of Health   Financial Resource Strain:   . Difficulty of Paying Living Expenses:   Food Insecurity:   . Worried About Charity fundraiser in the Last Year:   . Arboriculturist in the Last Year:   Transportation Needs:   . Film/video editor (Medical):   Marland Kitchen Lack of Transportation (Non-Medical):   Physical Activity:   . Days of Exercise per Week:   . Minutes of Exercise per Session:   Stress:   . Feeling of Stress :   Social Connections:   . Frequency of Communication with Friends and Family:   . Frequency of Social Gatherings with Friends and Family:   . Attends Religious Services:   . Active Member of Clubs or Organizations:   . Attends Archivist Meetings:   Marland Kitchen Marital Status:   Intimate Partner Violence:   . Fear of Current or Ex-Partner:   . Emotionally Abused:   Marland Kitchen Physically Abused:   . Sexually Abused:      Outpatient Medications Prior to Visit  Medication Sig Dispense Refill  . albuterol (PROVENTIL HFA;VENTOLIN HFA) 108 (90 Base) MCG/ACT inhaler Inhale 1-2 puffs into the lungs every 6 (six) hours as needed for wheezing or shortness of breath. 1 Inhaler 0  . aspirin 81 MG chewable tablet Chew 81 mg by mouth every morning.     Marland Kitchen atorvastatin (LIPITOR) 80 MG tablet Take 1 tablet by mouth once daily 90 tablet 1  . betamethasone dipropionate (DIPROLENE) 0.05 % cream Apply topically 2 (two) times daily. (Patient taking differently: Apply 1 application topically 2 (two) times daily as needed (skin care). ) 45 g 2  . buPROPion (WELLBUTRIN SR) 150 MG 12 hr tablet Take 1 tablet (150 mg total) by mouth 2 (two) times daily. 60 tablet 11  . dicyclomine (BENTYL) 10 MG capsule Take 1 capsule (10 mg total) by mouth 3 (three) times daily before meals. IM Program 90 capsule 0  . ferrous sulfate 325 (65 FE) MG EC tablet Take 1 tablet (325 mg total) by mouth daily with breakfast. 30 tablet 11  . glipiZIDE (GLUCOTROL) 5 MG tablet Take 1 tablet (5 mg total) by mouth daily. 90 tablet 1  . glucose blood (CONTOUR TEST) test strip Use as instructed 100 each 12  . levothyroxine (SYNTHROID) 150 MCG tablet Take 1 tablet (150 mcg total) by mouth daily before breakfast. 90 tablet 3  . loratadine (CLARITIN) 10 MG tablet Take 1 tablet (10 mg total) by mouth daily. 30 tablet 2  . losartan (COZAAR) 50 MG tablet Take 1 tablet (50 mg total) by mouth at bedtime. 90 tablet 3  . metFORMIN (GLUCOPHAGE) 1000 MG tablet Take 1 tablet (1,000 mg total) by mouth 2 (two) times daily. 60 tablet 11  . metoprolol succinate (TOPROL-XL) 100 MG 24 hr tablet TAKE 1 TABLET BY MOUTH ONCE DAILY WITH  OR  IMMEDIATELY  FOLLOWING  A  MEAL 90 tablet 3  . Microlet Lancets MISC Check CBG once daily 100 each 1  . nitroGLYCERIN (NITROSTAT) 0.4 MG SL tablet Place 1 tablet (0.4 mg total) under the tongue every 5 (five) minutes as needed for chest pain. 25 tablet  3  . Omega-3 Fatty Acids (FISH OIL) 1000 MG CAPS Take 1 capsule by mouth every morning.     Marland Kitchen omeprazole (PRILOSEC) 40 MG capsule Take 1 capsule (40 mg total) by mouth daily. 90 capsule 3  . ondansetron (ZOFRAN) 4 MG tablet TAKE 1 TABLET (  4 MG TOTAL) BY MOUTH EVERY 6 (SIX) HOURS AS NEEDED FOR NAUSEA. 20 tablet 0  . rOPINIRole (REQUIP) 1 MG tablet TAKE 1 TABLET BY MOUTH AT BEDTIME 90 tablet 0  . sertraline (ZOLOFT) 100 MG tablet TAKE 1 & 1/2 (ONE & ONE-HALF) TABLETS BY MOUTH ONCE DAILY 45 tablet 5  . Spacer/Aero-Holding Chambers (AEROCHAMBER PLUS) inhaler Use as instructed 1 each 2  . triamcinolone cream (KENALOG) 0.1 % Apply 1 application topically 2 (two) times daily. 80 g 3  . fluticasone (FLONASE) 50 MCG/ACT nasal spray Place 2 sprays into both nostrils daily. 16 g 2   No facility-administered medications prior to visit.    Allergies  Allergen Reactions  . Gabapentin     Skin rash, lip swelling  . Nsaids     IBU - Rectal bleeds; states ASA & Tylenol OK  . Opium Nausea And Vomiting  . Tramadol Hcl     "Just doesn't do anything for me"    Review of Systems  Constitutional: Negative for chills and fever.  HENT: Positive for congestion, postnasal drip and sore throat. Negative for ear pain, hearing loss, sinus pressure and trouble swallowing.   Respiratory: Negative for shortness of breath.   Cardiovascular: Negative for chest pain and palpitations.  Gastrointestinal: Negative for abdominal pain and nausea.  Musculoskeletal: Negative for arthralgias.       Objective:    Physical Exam Constitutional:      General: She is not in acute distress.    Appearance: Normal appearance.  HENT:     Nose: Mucosal edema present.     Right Turbinates: Swollen.     Left Turbinates: Enlarged.     Right Sinus: No maxillary sinus tenderness.     Left Sinus: No maxillary sinus tenderness.     Mouth/Throat:     Pharynx: Posterior oropharyngeal erythema present.  Cardiovascular:     Rate  and Rhythm: Normal rate and regular rhythm.  Pulmonary:     Effort: Pulmonary effort is normal.     Breath sounds: Normal breath sounds.  Lymphadenopathy:     Cervical: No cervical adenopathy.  Neurological:     Mental Status: She is alert.     BP 110/66 (BP Location: Left Arm, Patient Position: Supine, Cuff Size: Normal)   Pulse 76   Temp 98.4 F (36.9 C) (Oral)   Wt 247 lb 9.6 oz (112.3 kg)   SpO2 98%   BMI 43.86 kg/m  Wt Readings from Last 3 Encounters:  09/26/19 247 lb 9.6 oz (112.3 kg)  03/23/19 248 lb (112.5 kg)  12/02/18 237 lb 8 oz (107.7 kg)    Health Maintenance Due  Topic Date Due  . OPHTHALMOLOGY EXAM  02/11/2015  . FOOT EXAM  02/16/2019  . MAMMOGRAM  04/17/2019  . HEMOGLOBIN A1C  06/21/2019  . LIPID PANEL  09/02/2019    There are no preventive care reminders to display for this patient.   Lab Results  Component Value Date   TSH 2.300 12/02/2018   Lab Results  Component Value Date   WBC 9.0 03/23/2019   HGB 14.1 03/23/2019   HCT 42.4 03/23/2019   MCV 90 03/23/2019   PLT 242 03/23/2019   Lab Results  Component Value Date   NA 143 12/02/2018   K 3.6 12/02/2018   CO2 24 12/02/2018   GLUCOSE 108 (H) 12/02/2018   BUN 10 12/02/2018   CREATININE 0.67 12/02/2018   BILITOT 0.4 09/11/2018   ALKPHOS 105 09/11/2018  AST 19 09/11/2018   ALT 16 09/11/2018   PROT 6.2 (L) 09/11/2018   ALBUMIN 2.6 (L) 09/11/2018   CALCIUM 8.9 12/02/2018   CALCIUM 8.4 (L) 12/02/2018   ANIONGAP 8 09/15/2018   GFR 105.35 05/15/2011   Lab Results  Component Value Date   CHOL 114 09/02/2018   Lab Results  Component Value Date   HDL 32 (L) 09/02/2018   Lab Results  Component Value Date   LDLCALC 63 09/02/2018   Lab Results  Component Value Date   TRIG 93 09/02/2018   Lab Results  Component Value Date   CHOLHDL 3.6 09/02/2018   Lab Results  Component Value Date   HGBA1C 6.9 (A) 03/23/2019       Assessment & Plan:   Problem List Items Addressed This  Visit      Other   Postnasal drip    This is a chronic problem, but she reports worsening symptoms the last 2 weeks. She presented to an urgent care and was prescribed Prednisone and Azithromycin which did not improve her symptoms. She denies any respiratory symptoms. Primarily complains of nasal drainage, sore throat and productive cough in the morning off of which are consistent with postnasal drip.   -start cetirizine daily -intranasal corticosteroid twice daily  -can try humidifier at night       Relevant Medications   fluticasone (FLONASE) 50 MCG/ACT nasal spray       Meds ordered this encounter  Medications  . fluticasone (FLONASE) 50 MCG/ACT nasal spray    Sig: Place 2 sprays into both nostrils daily.    Dispense:  16 g    Refill:  2  . cetirizine (ZYRTEC ALLERGY) 10 MG tablet    Sig: Take 1 tablet (10 mg total) by mouth daily.    Dispense:  30 tablet    Refill:  2     Eivan Gallina D Petronella Shuford, DO

## 2019-10-04 NOTE — Progress Notes (Signed)
Internal Medicine Clinic Attending  Case discussed with Dr. Bloomfield  At the time of the visit.  We reviewed the resident's history and exam and pertinent patient test results.  I agree with the assessment, diagnosis, and plan of care documented in the resident's note.  

## 2019-10-15 ENCOUNTER — Other Ambulatory Visit: Payer: Self-pay | Admitting: Internal Medicine

## 2019-10-15 DIAGNOSIS — E118 Type 2 diabetes mellitus with unspecified complications: Secondary | ICD-10-CM

## 2019-10-26 ENCOUNTER — Encounter: Payer: Self-pay | Admitting: Internal Medicine

## 2019-10-26 ENCOUNTER — Encounter: Payer: Self-pay | Admitting: Dietician

## 2019-10-26 ENCOUNTER — Ambulatory Visit: Payer: Self-pay | Admitting: Internal Medicine

## 2019-10-26 ENCOUNTER — Ambulatory Visit: Payer: Self-pay | Admitting: Dietician

## 2019-10-26 ENCOUNTER — Other Ambulatory Visit: Payer: Self-pay

## 2019-10-26 VITALS — BP 150/73 | HR 66 | Temp 98.2°F | Ht 63.0 in | Wt 248.6 lb

## 2019-10-26 DIAGNOSIS — E1169 Type 2 diabetes mellitus with other specified complication: Secondary | ICD-10-CM

## 2019-10-26 DIAGNOSIS — Z955 Presence of coronary angioplasty implant and graft: Secondary | ICD-10-CM

## 2019-10-26 DIAGNOSIS — R079 Chest pain, unspecified: Secondary | ICD-10-CM

## 2019-10-26 DIAGNOSIS — I152 Hypertension secondary to endocrine disorders: Secondary | ICD-10-CM

## 2019-10-26 DIAGNOSIS — E118 Type 2 diabetes mellitus with unspecified complications: Secondary | ICD-10-CM

## 2019-10-26 DIAGNOSIS — E038 Other specified hypothyroidism: Secondary | ICD-10-CM

## 2019-10-26 DIAGNOSIS — F17210 Nicotine dependence, cigarettes, uncomplicated: Secondary | ICD-10-CM

## 2019-10-26 DIAGNOSIS — I10 Essential (primary) hypertension: Secondary | ICD-10-CM

## 2019-10-26 DIAGNOSIS — L409 Psoriasis, unspecified: Secondary | ICD-10-CM

## 2019-10-26 DIAGNOSIS — I2510 Atherosclerotic heart disease of native coronary artery without angina pectoris: Secondary | ICD-10-CM

## 2019-10-26 DIAGNOSIS — E039 Hypothyroidism, unspecified: Secondary | ICD-10-CM

## 2019-10-26 DIAGNOSIS — L209 Atopic dermatitis, unspecified: Secondary | ICD-10-CM

## 2019-10-26 DIAGNOSIS — E1159 Type 2 diabetes mellitus with other circulatory complications: Secondary | ICD-10-CM

## 2019-10-26 DIAGNOSIS — K50919 Crohn's disease, unspecified, with unspecified complications: Secondary | ICD-10-CM

## 2019-10-26 DIAGNOSIS — K529 Noninfective gastroenteritis and colitis, unspecified: Secondary | ICD-10-CM

## 2019-10-26 DIAGNOSIS — Z7984 Long term (current) use of oral hypoglycemic drugs: Secondary | ICD-10-CM

## 2019-10-26 LAB — POCT GLYCOSYLATED HEMOGLOBIN (HGB A1C): Hemoglobin A1C: 6.8 % — AB (ref 4.0–5.6)

## 2019-10-26 LAB — GLUCOSE, CAPILLARY: Glucose-Capillary: 135 mg/dL — ABNORMAL HIGH (ref 70–99)

## 2019-10-26 MED ORDER — NITROGLYCERIN 0.4 MG SL SUBL: 0.4000 mg | SUBLINGUAL_TABLET | SUBLINGUAL | 3 refills | Status: DC | PRN

## 2019-10-26 MED ORDER — LOSARTAN POTASSIUM 100 MG PO TABS: 100.0000 mg | ORAL_TABLET | Freq: Every day | ORAL | 1 refills | Status: DC

## 2019-10-26 MED ORDER — TRIAMCINOLONE ACETONIDE 0.1 % EX CREA: 1.0000 "application " | TOPICAL_CREAM | Freq: Two times a day (BID) | CUTANEOUS | 1 refills | Status: DC

## 2019-10-26 NOTE — Progress Notes (Signed)
Retinal images were done today per Dr. Rivka Safer order as part of this patient's yearly diabetes health maintenance program. They were transmitted electronically to an ophthalmologist who will interpret them and send a report back with their findings. This was explained to the patient and they were also informed that the results would be communicated to them either by phone, mail or both.  Debera Lat, RD 10/26/2019 1:47 PM.

## 2019-10-26 NOTE — Progress Notes (Signed)
Subjective:   Patient ID: Kendra Adams female   DOB: 1960-12-05 59 y.o.   MRN: [id number]  HPI: [name] is a 59 y.o. female with past medical history outlined below here for follow up of HTN and DM. For the details of today's visit, please refer to the assessment and plan.   Past Medical History:  Diagnosis Date  . Allergic rhinitis   . Asthma   . Atopic dermatitis   . CAD (coronary artery disease)    a. 12/13/10: mRCA 99%, EF 65-70%;  s/p BMS to mRCA  . COPD (chronic obstructive pulmonary disease) ([place])   . Depression   . DM2 (diabetes mellitus, type 2) ([place])   . GERD (gastroesophageal reflux disease)   . GI bleed   . HTN (hypertension)   . Hyperlipidemia   . Hyperlipidemia   . Hypothyroidism   . Psoriasis   . Seasonal allergies   . Tobacco abuse    Current Outpatient Medications  Medication Sig Dispense Refill  . albuterol (PROVENTIL HFA;VENTOLIN HFA) 108 (90 Base) MCG/ACT inhaler Inhale 1-2 puffs into the lungs every 6 (six) hours as needed for wheezing or shortness of breath. 1 Inhaler 0  . aspirin 81 MG chewable tablet Chew 81 mg by mouth every morning.     Marland Kitchen atorvastatin (LIPITOR) 80 MG tablet Take 1 tablet by mouth once daily 90 tablet 1  . betamethasone dipropionate (DIPROLENE) 0.05 % cream Apply topically 2 (two) times daily. (Patient taking differently: Apply 1 application topically 2 (two) times daily as needed (skin care). ) 45 g 2  . buPROPion (WELLBUTRIN SR) 150 MG 12 hr tablet Take 1 tablet (150 mg total) by mouth 2 (two) times daily. 60 tablet 11  . cetirizine (ZYRTEC ALLERGY) 10 MG tablet Take 1 tablet (10 mg total) by mouth daily. 30 tablet 2  . dicyclomine (BENTYL) 10 MG capsule Take 1 capsule (10 mg total) by mouth 3 (three) times daily before meals. IM Program 90 capsule 0  . ferrous sulfate 325 (65 FE) MG EC tablet Take 1 tablet (325 mg total) by mouth daily with breakfast. 30 tablet 11  . fluticasone (FLONASE) 50 MCG/ACT nasal spray Place  2 sprays into both nostrils daily. 16 g 2  . glipiZIDE (GLUCOTROL) 5 MG tablet Take 1 tablet (5 mg total) by mouth daily. 90 tablet 1  . glucose blood (CONTOUR TEST) test strip Use as instructed 100 each 12  . levothyroxine (SYNTHROID) 150 MCG tablet Take 1 tablet (150 mcg total) by mouth daily before breakfast. 90 tablet 3  . loratadine (CLARITIN) 10 MG tablet Take 1 tablet (10 mg total) by mouth daily. 30 tablet 2  . losartan (COZAAR) 50 MG tablet Take 1 tablet (50 mg total) by mouth at bedtime. 90 tablet 3  . metFORMIN (GLUCOPHAGE) 1000 MG tablet Take 1 tablet by mouth twice daily 180 tablet 0  . metoprolol succinate (TOPROL-XL) 100 MG 24 hr tablet TAKE 1 TABLET BY MOUTH ONCE DAILY WITH  OR  IMMEDIATELY  FOLLOWING  A  MEAL 90 tablet 3  . Microlet Lancets MISC Check CBG once daily 100 each 1  . nitroGLYCERIN (NITROSTAT) 0.4 MG SL tablet Place 1 tablet (0.4 mg total) under the tongue every 5 (five) minutes as needed for chest pain. 25 tablet 3  . Omega-3 Fatty Acids (FISH OIL) 1000 MG CAPS Take 1 capsule by mouth every morning.     Marland Kitchen omeprazole (PRILOSEC) 40 MG capsule Take 1 capsule (40 mg  total) by mouth daily. 90 capsule 3  . ondansetron (ZOFRAN) 4 MG tablet TAKE 1 TABLET (4 MG TOTAL) BY MOUTH EVERY 6 (SIX) HOURS AS NEEDED FOR NAUSEA. 20 tablet 0  . rOPINIRole (REQUIP) 1 MG tablet TAKE 1 TABLET BY MOUTH AT BEDTIME 90 tablet 0  . sertraline (ZOLOFT) 100 MG tablet TAKE 1 & 1/2 (ONE & ONE-HALF) TABLETS BY MOUTH ONCE DAILY 45 tablet 5  . Spacer/Aero-Holding Chambers (AEROCHAMBER PLUS) inhaler Use as instructed 1 each 2  . triamcinolone cream (KENALOG) 0.1 % Apply 1 application topically 2 (two) times daily. 80 g 3   No current facility-administered medications for this visit.   Family History  Problem Relation Age of Onset  . Diabetes Mother   . Hypertension Mother    Social History   Socioeconomic History  . Marital status: Married    Spouse name: Not on file  . Number of children: 0    . Years of education: 10th grade  . Highest education level: Not on file  Occupational History  . Occupation: Conservation officer, nature    Comment: self-employed  . 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 Smoking status: Current Every Day Smoker    Packs/day: 0.30    Years: 40.00    Pack years: 12.00    Types: Cigarettes    Last attempt to quit: 05/19/2014    Years since quitting: 5.4  . Smokeless tobacco: Never Used  . Tobacco comment: 2 cigs/day.  Vaping Use  . Vaping Use: Some days  Substance and Sexual Activity  . Alcohol use: No    Alcohol/week: 0.0 standard drinks  . Drug use: Yes    Frequency: 7.0 times per week    Types: Marijuana    Comment: Marijuana Use  . Sexual activity: Not on file  Other Topics Concern  . Not on file  Social History Narrative   Father died of suicide (gunshot) in 51. 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       Patient lives with her husband, her husband's niece Kendra Adams who is mentally retarded and her best friend and the friend's daughter and 6 dogs               Social Determinants of Health   Financial Resource Strain:   . Difficulty of Paying Living Expenses:   Food Insecurity:   . Worried About Charity fundraiser in the Last Year:   . Arboriculturist in the Last Year:   Transportation Needs:   . Film/video editor (Medical):   Marland Kitchen Lack of Transportation (Non-Medical):   Physical Activity:   . Days of Exercise per Week:   . Minutes of Exercise per Session:   Stress:   . Feeling of Stress :   Social Connections:   . Frequency of Communication with Friends and Family:   . Frequency of Social Gatherings with Friends and Family:   . Attends Religious Services:   . Active Member of Clubs or Organizations:   . Attends Archivist Meetings:   Marland Kitchen Marital Status:     Review of Systems: Review of Systems   Respiratory: Negative for shortness of breath.   Cardiovascular: Positive for chest pain.     Objective:  Physical Exam:  Vitals:   10/26/19 1046  BP: (!) 150/71  Pulse:  70  Temp: 98.2 F (36.8 C)  TempSrc: Oral  SpO2: 97%  Weight: 248 lb 9.6 oz (112.8 kg)  Height: 5' 3"  (1.6 m)    Physical Exam Constitutional:      Appearance: Normal appearance.  Cardiovascular:     Rate and Rhythm: Normal rate and regular rhythm.     Heart sounds: Normal heart sounds.  Pulmonary:     Effort: Pulmonary effort is normal. No respiratory distress.     Breath sounds: Normal breath sounds.  Abdominal:     General: Abdomen is flat. Bowel sounds are normal.     Palpations: Abdomen is soft.  Musculoskeletal:     Right lower leg: No edema.     Left lower leg: No edema.  Skin:    Comments: Diffuse scaly psoriasis plaques on bilateral arms   Neurological:     Mental Status: She is alert.      Assessment & Plan:   See Encounters Tab for problem based charting.

## 2019-10-26 NOTE — Patient Instructions (Addendum)
Ms. Mulligan,  It was a pleasure to see you today. I am glad you are doing well!   Today we discussed the following:  Chest Pain:  - I have refilled your nitroglycerin. Please give your cardiologist a call to schedule follow up.  Diabetes: - Keep up the great work. Continue to take your metformin and glipizide as previously prescribed.   High Blood Pressure: - Your blood pressure is uncontrolled. I have increased your losartan to 100 mg. Please continue to take metoprolol as previously prescribed.   Inflammatory Bowel Disease: - Continue to follow up with your GI doctor  Thyroid: - Continue synthroid, I am checking your labs today   Follow up with me again in 6 months or sooner if you have any issues. If you have any questions or concerns, call our clinic at [phone] or after hours call [phone] and ask for the internal medicine resident on call. Thank you!  Dr. Philipp Ovens

## 2019-10-27 ENCOUNTER — Telehealth: Payer: Self-pay | Admitting: Dietician

## 2019-10-27 ENCOUNTER — Encounter: Payer: Self-pay | Admitting: Internal Medicine

## 2019-10-27 LAB — BMP8+ANION GAP
Anion Gap: 13 mmol/L (ref 10.0–18.0)
BUN/Creatinine Ratio: 17 (ref 12–28)
BUN: 13 mg/dL (ref 8–27)
CO2: 25 mmol/L (ref 20–29)
Calcium: 9.8 mg/dL (ref 8.7–10.3)
Chloride: 100 mmol/L (ref 96–106)
Creatinine, Ser: 0.77 mg/dL (ref 0.57–1.00)
GFR calc Af Amer: 97 mL/min/1.73
GFR calc non Af Amer: 84 mL/min/1.73
Glucose: 129 mg/dL — ABNORMAL HIGH (ref 65–99)
Potassium: 4.9 mmol/L (ref 3.5–5.2)
Sodium: 138 mmol/L (ref 134–144)

## 2019-10-27 LAB — TSH: TSH: 10.1 u[IU]/mL — ABNORMAL HIGH (ref 0.450–4.500)

## 2019-10-27 NOTE — Assessment & Plan Note (Signed)
Currently at goal, Hgb A1c today is 6.8. She does not have her meter with her. Infrequently checks her CBG. Continues to have few episodes of dizziness but has not checked her blood sugar during these episodes as we previously discussed. Although frequency has decreased since we reduced her glipizide to 5 mg.  -- Continue metformin 1,000 mg BID -- Continue reduced dose glipizide 5 mg daily -- Instructed to check fast CBG daily and anytime she is symptomatic; call if she is experiencing hypoglycemia

## 2019-10-27 NOTE — Assessment & Plan Note (Signed)
Refilled triamcinolone cream 0.1% to be applied to affected areas on bilateral arms, BID prn.

## 2019-10-27 NOTE — Assessment & Plan Note (Signed)
Patient was diagnosed with Crohn's disease last year after presenting to the hospital with rectal bleeding and diarrhea, found to have colitis spanning from her ascending colon to her rectum with a small intramural abscess adjacent to the sigmoid colon. She improved with steroids and antibiotics. Biopsies from her colonoscopy were consistent with IBD, likely crohn's colitis. She now follows with GI and has been started on Humira. She is tolerating this well, symptoms are currently well controlled. Has occasional loose stool but overall doing well and no further bleeding. Will continue to follow along with GI, appreciate assistance.

## 2019-10-27 NOTE — Telephone Encounter (Signed)
Left voicemail for return call about results of retinal images: right eye- no diabetes retinopathy and left eye-  was low quality and inadequate for interpretation.

## 2019-10-27 NOTE — Assessment & Plan Note (Signed)
Patient has a history of CAD s/p 2 BMS to the RCA in Oct of 2012. She underwent repeat cardiac cath 08/14/2015 which showed nonobstructive CAD with stent to the RCA widely patent with normal LV function. She also had a normal stress test in March of 2019 with normal EF. Today she tells me that is has been experiencing exertional chest pain a few times a week. Pain improves with rest and nitroglycerin. She is requesting a refill. I have instructed patient to call her cardiologist office to schedule follow up as she may need another ischemic evaluation. She is currently chest pain free. Of note her BP is also elevated today and we are increasing her losartan.  -- Refilled SL nitro -- Continue ASA 81 and Lipitor 80  -- Instructed to follow up with cards asap  -- Optimizing BP; see HTN A&P

## 2019-10-27 NOTE — Assessment & Plan Note (Signed)
TSH is mildly elevated but she has previously been well controlled on synthroid 150 mcg for many years. Continue current dose, will repeat at follow up.

## 2019-10-27 NOTE — Assessment & Plan Note (Signed)
Blood pressure today is uncontrolled, 150/71. She is currently taking metoprolol succinate 100 mg daily and losartan 50 mg daily. Losartan was resumed at her last visit. She was previously taking losartan 100 mg daily however this was stopped in the setting of hypotension and acute blood loss anemia during her hospitalization for Crohn's colitis. We are now retitrating. Instructed patient to increase back to 100 mg daily and continue her metoprolol. Checking BMP today. -- Increase losartan 50 -> 100 mg daily -- Continue metoprolol succinate 100 mg daily  -- Follow up labs

## 2019-10-28 ENCOUNTER — Other Ambulatory Visit: Payer: Self-pay | Admitting: Internal Medicine

## 2019-10-28 DIAGNOSIS — Z8669 Personal history of other diseases of the nervous system and sense organs: Secondary | ICD-10-CM

## 2019-10-28 DIAGNOSIS — K219 Gastro-esophageal reflux disease without esophagitis: Secondary | ICD-10-CM

## 2019-10-28 DIAGNOSIS — E118 Type 2 diabetes mellitus with unspecified complications: Secondary | ICD-10-CM

## 2019-10-28 NOTE — Progress Notes (Signed)
RetinaVue report shows no diabetic retinopaty in left eye but right eye image quality inadequate for intrepretation.  Placed referral to Opthomology

## 2019-10-28 NOTE — Telephone Encounter (Signed)
Spoke with Ms. Spaugh today and explained the results of the retinal images were incomplete. She stated that  She will make an appointment with her eye doctor and ask them to fax [place] the results.

## 2019-11-09 ENCOUNTER — Encounter: Payer: Self-pay | Admitting: *Deleted

## 2019-11-10 ENCOUNTER — Ambulatory Visit (INDEPENDENT_AMBULATORY_CARE_PROVIDER_SITE_OTHER): Payer: Self-pay

## 2019-11-10 ENCOUNTER — Telehealth: Payer: Self-pay | Admitting: Internal Medicine

## 2019-11-10 ENCOUNTER — Ambulatory Visit (HOSPITAL_COMMUNITY)
Admission: EM | Admit: 2019-11-10 | Discharge: 2019-11-10 | Disposition: A | Discharge status: Home / Self Care | Payer: Self-pay | Attending: Family Medicine | Admitting: Family Medicine

## 2019-11-10 ENCOUNTER — Other Ambulatory Visit: Payer: Self-pay

## 2019-11-10 DIAGNOSIS — Z7982 Long term (current) use of aspirin: Secondary | ICD-10-CM | POA: Insufficient documentation

## 2019-11-10 DIAGNOSIS — I10 Essential (primary) hypertension: Secondary | ICD-10-CM | POA: Insufficient documentation

## 2019-11-10 DIAGNOSIS — Z886 Allergy status to analgesic agent status: Secondary | ICD-10-CM | POA: Insufficient documentation

## 2019-11-10 DIAGNOSIS — E785 Hyperlipidemia, unspecified: Secondary | ICD-10-CM | POA: Insufficient documentation

## 2019-11-10 DIAGNOSIS — K219 Gastro-esophageal reflux disease without esophagitis: Secondary | ICD-10-CM | POA: Insufficient documentation

## 2019-11-10 DIAGNOSIS — I700 Atherosclerosis of aorta: Secondary | ICD-10-CM | POA: Insufficient documentation

## 2019-11-10 DIAGNOSIS — Z8249 Family history of ischemic heart disease and other diseases of the circulatory system: Secondary | ICD-10-CM | POA: Insufficient documentation

## 2019-11-10 DIAGNOSIS — J449 Chronic obstructive pulmonary disease, unspecified: Secondary | ICD-10-CM | POA: Insufficient documentation

## 2019-11-10 DIAGNOSIS — F17210 Nicotine dependence, cigarettes, uncomplicated: Secondary | ICD-10-CM | POA: Insufficient documentation

## 2019-11-10 DIAGNOSIS — D649 Anemia, unspecified: Secondary | ICD-10-CM | POA: Insufficient documentation

## 2019-11-10 DIAGNOSIS — R509 Fever, unspecified: Secondary | ICD-10-CM

## 2019-11-10 DIAGNOSIS — Z20822 Contact with and (suspected) exposure to covid-19: Secondary | ICD-10-CM | POA: Insufficient documentation

## 2019-11-10 DIAGNOSIS — E039 Hypothyroidism, unspecified: Secondary | ICD-10-CM | POA: Insufficient documentation

## 2019-11-10 DIAGNOSIS — E1159 Type 2 diabetes mellitus with other circulatory complications: Secondary | ICD-10-CM | POA: Insufficient documentation

## 2019-11-10 DIAGNOSIS — I2510 Atherosclerotic heart disease of native coronary artery without angina pectoris: Secondary | ICD-10-CM | POA: Insufficient documentation

## 2019-11-10 DIAGNOSIS — F329 Major depressive disorder, single episode, unspecified: Secondary | ICD-10-CM | POA: Insufficient documentation

## 2019-11-10 DIAGNOSIS — Z955 Presence of coronary angioplasty implant and graft: Secondary | ICD-10-CM | POA: Insufficient documentation

## 2019-11-10 DIAGNOSIS — Z833 Family history of diabetes mellitus: Secondary | ICD-10-CM | POA: Insufficient documentation

## 2019-11-10 DIAGNOSIS — Z888 Allergy status to other drugs, medicaments and biological substances status: Secondary | ICD-10-CM | POA: Insufficient documentation

## 2019-11-10 DIAGNOSIS — Z9049 Acquired absence of other specified parts of digestive tract: Secondary | ICD-10-CM | POA: Insufficient documentation

## 2019-11-10 DIAGNOSIS — Z79899 Other long term (current) drug therapy: Secondary | ICD-10-CM | POA: Insufficient documentation

## 2019-11-10 DIAGNOSIS — N12 Tubulo-interstitial nephritis, not specified as acute or chronic: Secondary | ICD-10-CM | POA: Insufficient documentation

## 2019-11-10 DIAGNOSIS — R05 Cough: Secondary | ICD-10-CM

## 2019-11-10 DIAGNOSIS — Z7984 Long term (current) use of oral hypoglycemic drugs: Secondary | ICD-10-CM | POA: Insufficient documentation

## 2019-11-10 LAB — CBC WITH DIFFERENTIAL/PLATELET
Abs Immature Granulocytes: 0.16 10*3/uL — ABNORMAL HIGH (ref 0.00–0.07)
Basophils Absolute: 0.1 10*3/uL (ref 0.0–0.1)
Basophils Relative: 0 %
Eosinophils Absolute: 0.1 10*3/uL (ref 0.0–0.5)
Eosinophils Relative: 0 %
HCT: 42.5 % (ref 36.0–46.0)
Hemoglobin: 14.4 g/dL (ref 12.0–15.0)
Immature Granulocytes: 1 %
Lymphocytes Relative: 9 %
Lymphs Abs: 2.1 10*3/uL (ref 0.7–4.0)
MCH: 31.4 pg (ref 26.0–34.0)
MCHC: 33.9 g/dL (ref 30.0–36.0)
MCV: 92.8 fL (ref 80.0–100.0)
Monocytes Absolute: 2.7 10*3/uL — ABNORMAL HIGH (ref 0.1–1.0)
Monocytes Relative: 12 %
Neutro Abs: 17.7 10*3/uL — ABNORMAL HIGH (ref 1.7–7.7)
Neutrophils Relative %: 78 %
Platelets: 223 10*3/uL (ref 150–400)
RBC: 4.58 MIL/uL (ref 3.87–5.11)
RDW: 13.2 % (ref 11.5–15.5)
WBC: 22.9 10*3/uL — ABNORMAL HIGH (ref 4.0–10.5)
nRBC: 0 % (ref 0.0–0.2)

## 2019-11-10 LAB — POCT URINALYSIS DIPSTICK, ED / UC
Bilirubin Urine: NEGATIVE
Glucose, UA: NEGATIVE mg/dL
Ketones, ur: NEGATIVE mg/dL
Nitrite: POSITIVE — AB
Protein, ur: 100 mg/dL — AB
Specific Gravity, Urine: 1.02 (ref 1.005–1.030)
Urobilinogen, UA: 0.2 mg/dL (ref 0.0–1.0)
pH: 6 (ref 5.0–8.0)

## 2019-11-10 LAB — BASIC METABOLIC PANEL
Anion gap: 11 (ref 5–15)
BUN: 12 mg/dL (ref 6–20)
CO2: 24 mmol/L (ref 22–32)
Calcium: 9.5 mg/dL (ref 8.9–10.3)
Chloride: 100 mmol/L (ref 98–111)
Creatinine, Ser: 1.08 mg/dL — ABNORMAL HIGH (ref 0.44–1.00)
GFR calc Af Amer: >60 mL/min (ref >60)
GFR calc non Af Amer: 56 mL/min — ABNORMAL LOW (ref >60)
Glucose, Bld: 102 mg/dL — ABNORMAL HIGH (ref 70–99)
Potassium: 3.8 mmol/L (ref 3.5–5.1)
Sodium: 135 mmol/L (ref 135–145)

## 2019-11-10 MED ORDER — ACETAMINOPHEN 325 MG PO TABS
ORAL_TABLET | ORAL | Status: AC
  Filled 2019-11-10: qty 2

## 2019-11-10 MED ORDER — LIDOCAINE HCL (PF) 1 % IJ SOLN
INTRAMUSCULAR | Status: AC
  Filled 2019-11-10: qty 2

## 2019-11-10 MED ORDER — CEFTRIAXONE SODIUM 1 G IJ SOLR
1.0000 g | Freq: Once | INTRAMUSCULAR | Status: AC
  Administered 2019-11-10: 1 g via INTRAMUSCULAR

## 2019-11-10 MED ORDER — ACETAMINOPHEN 325 MG PO TABS
650.0000 mg | ORAL_TABLET | Freq: Once | ORAL | Status: AC
  Administered 2019-11-10: 650 mg via ORAL

## 2019-11-10 MED ORDER — ACETAMINOPHEN 325 MG PO TABS: 650.0000 mg | ORAL_TABLET | Freq: Four times a day (QID) | ORAL | 0 refills | Status: DC | PRN

## 2019-11-10 MED ORDER — ONDANSETRON HCL 4 MG PO TABS: 4.0000 mg | ORAL_TABLET | Freq: Three times a day (TID) | ORAL | 0 refills | Status: AC | PRN

## 2019-11-10 MED ORDER — CEFTRIAXONE SODIUM 1 G IJ SOLR
INTRAMUSCULAR | Status: AC
  Filled 2019-11-10: qty 10

## 2019-11-10 MED ORDER — CIPROFLOXACIN HCL 500 MG PO TABS: 500.0000 mg | ORAL_TABLET | Freq: Two times a day (BID) | ORAL | 0 refills | Status: AC

## 2019-11-10 NOTE — Discharge Instructions (Signed)
Take the ciprofloxacin as prescribed Take Tylenol for your fever Take Zofran only if unable to tolerate liquids  If you feel worse to include shortness of breath, belly pain, higher fevers, feel faint go to the emergency department  I would like for you to be reevaluated tomorrow if you can be followed up by your primary care this is okay or return to this clinic  We have sent a urine culture and will contact you when this returns determine if we need to change medications  We are also sending a Covid test out of precaution.  Continue all of your regular prescribed medications.

## 2019-11-10 NOTE — Telephone Encounter (Signed)
Called spoke w/ pt, she states she went out to eat last evening and then awoke around midnight with h/a, nausea, vomiting, feeling horrible, checked temp 101. This morning she feels much better, just kind of washed out, no fever, no nausea. She is asked if she has experienced this before, states about 3 -4 momths ago, it went away She is ask to rest, take lots of PO fluids as she cqn tolerate, BRATT diet given. Check temp today. If severe h/a high fever above 101, unable to eat or drink, short of breath, chest pain, severe vomiting, change in vision or speech call 911, f/u with clinic call if questions She is agreeable Do you agree?

## 2019-11-10 NOTE — ED Provider Notes (Signed)
[place]    CSN: [id number] Arrival date & time: 11/10/19  1544      History   Chief Complaint Chief Complaint  Patient presents with  . Generalized Body Aches  . Fever    HPI Kendra Adams is a 59 y.o. female.   Patient with history of COPD, CAD, diabetes presents for cute onset of fever, body aches, frequent urination.  The symptoms started yesterday.  She reports yesterday having a little bit of right-sided flank pain but this is also resided.  She does report 2 episodes of vomiting one yesterday one today.  She has been on to tolerate liquids throughout the day.  She has not tried to eat food.  She denies abdominal pain.  Moving her bowels per usual.  She does not endorse shortness of breath.  She has been using all her COPD inhalers per usual.  She has felt a little lightheaded at times but has not felt like she will pass out.  Denies chest pain.  Baseline cough with occasional sputum.  No significant increase.  Denies sore throat.  Denies known sick contacts.  Received both Covid vaccines.  Called and discussed her symptoms with her primary care and they recommended she come to urgent care today.     Past Medical History:  Diagnosis Date  . Allergic rhinitis   . Asthma   . Atopic dermatitis   . CAD (coronary artery disease)    a. 12/13/10: mRCA 99%, EF 65-70%;  s/p BMS to mRCA  . COPD (chronic obstructive pulmonary disease) ([place])   . Depression   . DM2 (diabetes mellitus, type 2) ([place])   . GERD (gastroesophageal reflux disease)   . GI bleed   . HTN (hypertension)   . Hyperlipidemia   . Hyperlipidemia   . Hypothyroidism   . Psoriasis   . Seasonal allergies   . Tobacco abuse     Patient Active Problem List   Diagnosis Date Noted  . Postnasal drip 09/27/2019  . Ear pain, right 03/23/2019  . Need for vaccination against hepatitis B virus 12/02/2018  . Hypocalcemia 12/02/2018  . Olecranon bursitis of left elbow 12/02/2018  . Preoperative  clearance 09/27/2018  . Crohn's disease ([place]) 09/22/2018  . Anemia 09/22/2018  . Aortic atherosclerosis ([place]) 09/12/2018  . Bilateral hip pain 05/27/2017  . Dyspareunia, female 04/06/2017  . Atopic dermatitis 01/01/2017  . History of restless legs syndrome 11/24/2016  . 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 Psoriasis 10/15/2015  . Elevated transaminase level 08/12/2015  . Tendinitis of left rotator cuff 01/19/2015  . Tobacco use disorder 09/07/2014  . Dysuria 01/07/2013  . Morbid obesity ([place]) 03/19/2011  . Cough 03/17/2011  . Routine health maintenance 02/03/2011  . CAD (coronary artery disease) 12/26/2010  . Diabetes mellitus type 2 with complications ([place]) 97/04/6376  . Hypertension associated with diabetes ([place]) 05/10/2008  . Hypothyroidism 04/27/2008  . HLD (hyperlipidemia) 04/27/2008  . Depression 04/27/2008  . GERD 04/27/2008    Past Surgical History:  Procedure Laterality Date  . BIOPSY  10/28/2018   Procedure: BIOPSY;  Surgeon: Juanita Craver, MD;  Location: [facility];  Service: Endoscopy;;  . CARDIAC CATHETERIZATION N/A 08/14/2015   Procedure: Left Heart Cath and Coronary Angiography;  Surgeon: Peter M Martinique, MD;  Location: [facility];  Service: Cardiovascular;  Laterality: N/A;  . CARPAL TUNNEL RELEASE     w/ bone spurs  . CHOLECYSTECTOMY N/A 08/15/2015   Procedure: LAPAROSCOPIC CHOLECYSTECTOMY WITH  ATTEMPTED INTRAOPERATIVE CHOLANGIOGRAM;  Surgeon: Erroll Luna, MD;  Location: [facility];  Service: General;  Laterality: N/A;  . COLONOSCOPY WITH PROPOFOL N/A 10/28/2018   Procedure: COLONOSCOPY WITH PROPOFOL;  Surgeon: Juanita Craver, MD;  Location: [facility];  Service: Endoscopy;  Laterality: N/A;  . CORONARY ANGIOPLASTY WITH STENT PLACEMENT    . DILATION AND CURETTAGE OF UTERUS      OB History    Gravida  2   Para      Term      Preterm      AB  2   Living        SAB  1   TAB  1   Ectopic      Multiple      Live  Births               Home Medications    Prior to Admission medications   Medication Sig Start Date End Date Taking? Authorizing Provider  acetaminophen (TYLENOL) 325 MG tablet Take 2 tablets (650 mg total) by mouth every 6 (six) hours as needed. 11/10/19   Drako Maese, Marguerita Beards, PA-C  albuterol (PROVENTIL HFA;VENTOLIN HFA) 108 (90 Base) MCG/ACT inhaler Inhale 1-2 puffs into the lungs every 6 (six) hours as needed for wheezing or shortness of breath. 01/01/18   [name], MD  aspirin 81 MG chewable tablet Chew 81 mg by mouth every morning.     [provider]  atorvastatin (LIPITOR) 80 MG tablet Take 1 tablet by mouth once daily 09/16/19   [name], MD  betamethasone dipropionate (DIPROLENE) 0.05 % cream Apply topically 2 (two) times daily. Patient taking differently: Apply 1 application topically 2 (two) times daily as needed (skin care).  02/15/18   [name], MD  buPROPion (WELLBUTRIN SR) 150 MG 12 hr tablet Take 1 tablet (150 mg total) by mouth 2 (two) times daily. 03/23/19   [name], MD  cetirizine (ZYRTEC ALLERGY) 10 MG tablet Take 1 tablet (10 mg total) by mouth daily. 09/26/19 09/25/20  [place]om[place], Nila Nephew D, DO  ciprofloxacin (CIPRO) 500 MG tablet Take 1 tablet (500 mg total) by mouth every 12 (twelve) hours for 7 days. 11/10/19 11/17/19  Deerica Waszak, Marguerita Beards, PA-C  dicyclomine (BENTYL) 10 MG capsule Take 1 capsule (10 mg total) by mouth 3 (three) times daily before meals. IM Program 09/15/18   Al Decant, MD  ferrous sulfate 325 (65 FE) MG EC tablet Take 1 tablet (325 mg total) by mouth daily with breakfast. 10/18/18 10/18/19  Jean Rosenthal, MD  fluticasone (FLONASE) 50 MCG/ACT nasal spray Place 2 sprays into both nostrils daily. 09/26/19   [place]omfield, Carley D, DO  glipiZIDE (GLUCOTROL) 5 MG tablet Take 1 tablet (5 mg total) by mouth daily. 03/23/19   [name], MD  glucose blood (CONTOUR TEST) test strip Use as instructed 03/23/19   [name], MD    levothyroxine (SYNTHROID) 150 MCG tablet Take 1 tablet (150 mcg total) by mouth daily before breakfast. 03/23/19   [name], MD  loratadine (CLARITIN) 10 MG tablet Take 1 tablet (10 mg total) by mouth daily. 02/15/18   [name], MD  losartan (COZAAR) 100 MG tablet Take 1 tablet (100 mg total) by mouth at bedtime. 10/26/19   [name], MD  metFORMIN (GLUCOPHAGE) 1000 MG tablet Take 1 tablet by mouth twice daily 10/17/19   [name], MD  metoprolol succinate (TOPROL-XL) 100 MG 24 hr tablet TAKE 1 TABLET BY MOUTH ONCE DAILY WITH  OR  IMMEDIATELY  FOLLOWING  A  MEAL 11/30/18   Dorothy Spark, MD  Microlet Lancets MISC Check CBG once daily 03/29/19   [name], MD  nitroGLYCERIN (NITROSTAT) 0.4 MG SL tablet Place 1 tablet (0.4 mg total) under the tongue every 5 (five) minutes as needed for chest pain. 10/26/19   [name], MD  Omega-3 Fatty Acids (FISH OIL) 1000 MG CAPS Take 1 capsule by mouth every morning.     [provider]  omeprazole (PRILOSEC) 40 MG capsule Take 1 capsule by mouth once daily 10/28/19   [name], MD  ondansetron (ZOFRAN) 4 MG tablet Take 1 tablet (4 mg total) by mouth every 8 (eight) hours as needed for nausea or vomiting. 11/10/19   Tylee Yum, Marguerita Beards, PA-C  rOPINIRole (REQUIP) 1 MG tablet TAKE 1 TABLET BY MOUTH AT BEDTIME 10/28/19   [name], MD  sertraline (ZOLOFT) 100 MG tablet TAKE 1 & 1/2 (ONE & ONE-HALF) TABLETS BY MOUTH ONCE DAILY 09/16/19   [name], MD  Spacer/Aero-Holding Chambers (AEROCHAMBER PLUS) inhaler Use as instructed 02/03/15   Melynda Ripple, MD  triamcinolone cream (KENALOG) 0.1 % Apply 1 application topically 2 (two) times daily. 10/26/19   [name], MD    Family History Family History  Problem Relation Age of Onset  . Diabetes Mother   . Hypertension Mother     Social History Social History   Tobacco Use  . Smoking status: Current Every Day Smoker    Packs/day: 0.30     Years: 40.00    Pack years: 12.00    Types: Cigarettes    Last attempt to quit: 05/19/2014    Years since quitting: 5.4  . Smokeless tobacco: Never Used  . Tobacco comment: 2 cigs/day.  Vaping Use  . Vaping Use: Some days  Substance Use Topics  . Alcohol use: No    Alcohol/week: 0.0 standard drinks  . Drug use: Yes    Frequency: 7.0 times per week    Types: Marijuana    Comment: Marijuana Use     Allergies   Gabapentin, Nsaids, Opium, and Tramadol hcl   Review of Systems Review of Systems   Physical Exam Triage Vital Signs ED Triage Vitals  Enc Vitals Group     BP 11/10/19 1730 (!) 149/88     Pulse Rate 11/10/19 1730 93     Resp 11/10/19 1730 (!) 22     Temp 11/10/19 1730 (!) 102.5 F (39.2 C)     Temp src --      SpO2 11/10/19 1730 93 %     Weight --      Height --      Head Circumference --      Peak Flow --      Pain Score 11/10/19 1956 5     Pain Loc --      Pain Edu? --      Excl. in [place]? --    No data found.  Updated Vital Signs BP (!) 149/88 (BP Location: Right Arm)   Pulse 91   Temp (!) 101 F (38.3 C)   Resp (!) 22   SpO2 94%   Visual Acuity Right Eye Distance:   Left Eye Distance:   Bilateral Distance:    Right Eye Near:   Left Eye Near:    Bilateral Near:     Physical Exam Vitals and nursing note reviewed.  Constitutional:      General: She is not in acute distress.  Appearance: Normal appearance. She is well-developed. She is not ill-appearing.  HENT:     Head: Normocephalic and atraumatic.     Mouth/Throat:     Mouth: Mucous membranes are moist.     Pharynx: No posterior oropharyngeal erythema.  Eyes:     Conjunctiva/sclera: Conjunctivae normal.  Cardiovascular:     Rate and Rhythm: Normal rate and regular rhythm.     Heart sounds: No murmur heard.   Pulmonary:     Effort: Pulmonary effort is normal. No respiratory distress.     Breath sounds: Normal breath sounds.     Comments: Speaking in full sentences.  Moving  air okay on lung exam, saturating between 94-96% on room air. Abdominal:     Palpations: Abdomen is soft.     Tenderness: There is no abdominal tenderness. There is no right CVA tenderness or left CVA tenderness.  Musculoskeletal:     Cervical back: Neck supple.     Right lower leg: No edema.     Left lower leg: No edema.  Skin:    General: Skin is warm and dry.  Neurological:     Mental Status: She is alert.      UC Treatments / Results  Labs (all labs ordered are listed, but only abnormal results are displayed) Labs Reviewed  BASIC METABOLIC PANEL - Abnormal; Notable for the following components:      Result Value   Glucose, Bld 102 (*)    Creatinine, Ser 1.08 (*)    GFR calc non Af Amer 56 (*)    All other components within normal limits  CBC WITH DIFFERENTIAL/PLATELET - Abnormal; Notable for the following components:   WBC 22.9 (*)    Neutro Abs 17.7 (*)    Monocytes Absolute 2.7 (*)    Abs Immature Granulocytes 0.16 (*)    All other components within normal limits  POCT URINALYSIS DIPSTICK, ED / UC - Abnormal; Notable for the following components:   Hgb urine dipstick SMALL (*)    Protein, ur 100 (*)    Nitrite POSITIVE (*)    Leukocytes,Ua SMALL (*)    All other components within normal limits  URINE CULTURE  SARS CORONAVIRUS 2 (TAT 6-24 HRS)    EKG   Radiology DG Chest 2 View  Result Date: 11/10/2019 CLINICAL DATA:  Productive cough EXAM: CHEST - 2 VIEW COMPARISON:  09/18/2019 FINDINGS: The heart size and mediastinal contours are within normal limits. Both lungs are clear. The visualized skeletal structures are unremarkable. Aortic atherosclerosis. Mild chronic bronchitic changes. IMPRESSION: No active cardiopulmonary disease. Electronically Signed   By: Donavan Foil M.D.   On: 11/10/2019 18:47    Procedures Procedures (including critical care time)  Medications Ordered in UC Medications  acetaminophen (TYLENOL) tablet 650 mg (650 mg Oral Given 11/10/19  1739)  cefTRIAXone (ROCEPHIN) injection 1 g (1 g Intramuscular Given 11/10/19 1948)    Initial Impression / Assessment and Plan / UC Course  I have reviewed the triage vital signs and the nursing notes.  Pertinent labs & imaging results that were available during my care of the patient were reviewed by me and considered in my medical decision making (see chart for details).     #Pyelonephritis Patient is a 59 year old presenting with history of COPD, CAD and diabetes with what appears to be a pyelonephritis.  Chest x-ray negative.  No CVA tenderness however given nitrite and leukocyte positive with 102.5 temp and elevated heart rate on vital signs, suspicious for Pylo.  White blood cell count at 22.9, slight increase in creatinine from 0.77-1.09.  However patient appears very well in clinic, despite fever and elevated white blood cell count And has been tolerating fluids well in clinic.  Temperature is decreased and overall she states she is feeling better after Tylenol.  I believe we can treat her at home with a shot of Rocephin here and ciprofloxacin outpatient.  Zofran given in the event of nausea.  Strict emergency department precautions discussed.  Instructed her to call her primary care in the morning to see if she can be followed up on tomorrow and if not to return to clinic for reevaluation.  Patient is agreeable to this plan. -We will also test her for Covid Final Clinical Impressions(s) / UC Diagnoses   Final diagnoses:  Pyelonephritis     Discharge Instructions     Take the ciprofloxacin as prescribed Take Tylenol for your fever Take Zofran only if unable to tolerate liquids  If you feel worse to include shortness of breath, belly pain, higher fevers, feel faint go to the emergency department  I would like for you to be reevaluated tomorrow if you can be followed up by your primary care this is okay or return to this clinic  We have sent a urine culture and will contact you  when this returns determine if we need to change medications  We are also sending a Covid test out of precaution.  Continue all of your regular prescribed medications.     ED Prescriptions    Medication Sig Dispense Auth. Provider   ciprofloxacin (CIPRO) 500 MG tablet Take 1 tablet (500 mg total) by mouth every 12 (twelve) hours for 7 days. 14 tablet Pollyann Roa, Marguerita Beards, PA-C   ondansetron (ZOFRAN) 4 MG tablet Take 1 tablet (4 mg total) by mouth every 8 (eight) hours as needed for nausea or vomiting. 4 tablet Iyani Dresner, Marguerita Beards, PA-C   acetaminophen (TYLENOL) 325 MG tablet Take 2 tablets (650 mg total) by mouth every 6 (six) hours as needed. 30 tablet Sharilyn Geisinger, Marguerita Beards, PA-C     PDMP not reviewed this encounter.   Purnell Shoemaker, PA-C 11/10/19 2002

## 2019-11-10 NOTE — Telephone Encounter (Signed)
Pt calls and states she is going to urg care. States she feels really bad and fever is 102. Orally now. Agreed with pt.

## 2019-11-10 NOTE — Telephone Encounter (Signed)
Pls contact pt [phone], pt has a temperature, headaches, vomiting,

## 2019-11-10 NOTE — ED Triage Notes (Addendum)
Patient reports fever since yesterday. Reports "swimmy head" feeling and headache. Patient did take all medications today. Reports nasal congestion and is taking inhaler, states that is helping get mucus out. Increase in shortness of breath has hx of COPD-not on O2.   Patient reports polyuria, states increase incontinence.

## 2019-11-10 NOTE — Telephone Encounter (Signed)
Sounds reasonable. Do you know what she ate for dinner? Was it a kind or pork or red meat? Symptoms could be from a self limited gastroenteritis, other possibility is allergic reaction to alpha gal protein.

## 2019-11-11 ENCOUNTER — Encounter: Payer: Self-pay | Admitting: Internal Medicine

## 2019-11-11 ENCOUNTER — Ambulatory Visit (INDEPENDENT_AMBULATORY_CARE_PROVIDER_SITE_OTHER): Payer: Self-pay | Admitting: Internal Medicine

## 2019-11-11 DIAGNOSIS — N12 Tubulo-interstitial nephritis, not specified as acute or chronic: Secondary | ICD-10-CM

## 2019-11-11 DIAGNOSIS — N10 Acute tubulo-interstitial nephritis: Secondary | ICD-10-CM

## 2019-11-11 LAB — SARS CORONAVIRUS 2 (TAT 6-24 HRS): SARS Coronavirus 2: NEGATIVE

## 2019-11-11 NOTE — Assessment & Plan Note (Signed)
Pyelonephritis: Kendra Adams presented to the urgent care on 11/10/2019 complaining of right side pain, generalized malaise, fever, nausea, vomiting and polyuria.  She was found to be febrile with temperature of 102.5 F, labs revealed leukocytosis with WBC of 22, neutrophil predominance, urinalysis revealed pyuria and positive nitrites with microscopic hemoglobinuria.  She was given intramuscular ceftriaxone and subsequently discharged on ciprofloxacin.  Since discharge from the urgent care clinic she has remained afebrile and denies any further episodes of chills or malaise.  She had an episode of nausea and one episode of nonbloody nonbilious emesis this morning.  She is however able to keep down her medications.  Plan: -Patient advised to continue 7-day course of ciprofloxacin -She is given strict return precautions -Follow up urine culture

## 2019-11-11 NOTE — Progress Notes (Signed)
Internal Medicine Clinic Attending  Case discussed with Dr. Agyei  At the time of the visit.  We reviewed the resident's history and exam and pertinent patient test results.  I agree with the assessment, diagnosis, and plan of care documented in the resident's note.  

## 2019-11-11 NOTE — Patient Instructions (Signed)
[name],   It was a pleasure taking care of you today. Please continue taking your ciprofloxacin. If you start experiencing high fevers, chills and feeling unwell, please go to the emergency room  Dr. Eileen Stanford  Please call the internal medicine center clinic if you have any questions or concerns, we may be able to help and keep you from a long and expensive emergency room wait. Our clinic and after hours phone number is [phone], the best time to call is Monday through Friday 9 am to 4 pm but there is always someone available 24/7 if you have an emergency. If you need medication refills please notify your pharmacy one week in advance and they will send [place] a request.

## 2019-11-11 NOTE — Progress Notes (Signed)
   CC: Follow-up uncomplicated pyelonephritis  HPI:  [name] is a 59 y.o. post medical history significant for diabetes mellitus, hypertension, hyperlipidemia, COPD, CAD, inflammatory bowel disease on Humira here after recent ED visit for pyelonephritis.  Please see problem based charting for further details.    Past Medical History:  Diagnosis Date  . Allergic rhinitis   . Asthma   . Atopic dermatitis   . CAD (coronary artery disease)    a. 12/13/10: mRCA 99%, EF 65-70%;  s/p BMS to mRCA  . COPD (chronic obstructive pulmonary disease) ([place])   . Depression   . DM2 (diabetes mellitus, type 2) ([place])   . GERD (gastroesophageal reflux disease)   . GI bleed   . HTN (hypertension)   . Hyperlipidemia   . Hyperlipidemia   . Hypothyroidism   . Psoriasis   . Seasonal allergies   . Tobacco abuse    Review of Systems:  As per HPI  Physical Exam:  Vitals:   11/11/19 1452  BP: 125/74  Pulse: 89  Temp: 99.7 F (37.6 C)  TempSrc: Oral  SpO2: 98%  Weight: 245 lb 6.4 oz (111.3 kg)  Height: 5' 3"  (1.6 m)   Physical Exam Cardiovascular:     Rate and Rhythm: Normal rate.     Heart sounds: No murmur heard.   Pulmonary:     Effort: Pulmonary effort is normal.     Breath sounds: No wheezing.  Abdominal:     General: Bowel sounds are normal.     Tenderness: There is no abdominal tenderness. There is no right CVA tenderness or left CVA tenderness.     Assessment & Plan:   See Encounters Tab for problem based charting.  Patient discussed with Dr. Evette Doffing

## 2019-11-12 LAB — URINE CULTURE: Culture: >=100000 — AB

## 2019-11-15 ENCOUNTER — Ambulatory Visit (INDEPENDENT_AMBULATORY_CARE_PROVIDER_SITE_OTHER): Payer: Self-pay | Admitting: Cardiology

## 2019-11-15 ENCOUNTER — Encounter: Payer: Self-pay | Admitting: *Deleted

## 2019-11-15 ENCOUNTER — Other Ambulatory Visit: Payer: Self-pay

## 2019-11-15 ENCOUNTER — Encounter: Payer: Self-pay | Admitting: Cardiology

## 2019-11-15 VITALS — BP 134/74 | HR 101 | Ht 63.0 in | Wt 243.8 lb

## 2019-11-15 DIAGNOSIS — I2510 Atherosclerotic heart disease of native coronary artery without angina pectoris: Secondary | ICD-10-CM

## 2019-11-15 DIAGNOSIS — R079 Chest pain, unspecified: Secondary | ICD-10-CM

## 2019-11-15 DIAGNOSIS — E785 Hyperlipidemia, unspecified: Secondary | ICD-10-CM

## 2019-11-15 DIAGNOSIS — I10 Essential (primary) hypertension: Secondary | ICD-10-CM

## 2019-11-15 NOTE — Progress Notes (Signed)
Cardiology Office Note    Date:  11/15/2019   ID:  Kendra Adams, DOB April 21, 1960, MRN [id number]  PCP:  [name], MD  Cardiologist: Ena Dawley, MD EPS: None  Chief complain: Chest pain  History of Present Illness:  Kendra Adams is a 59 y.o. female with history of CAD status post 2 BMS to the RCA 12/2010, HLD, tobacco abuse, DM type II, hypertension.  Repeat cardiac cath 08/14/2015 nonobstructive CAD with stent to the RCA widely patent with normal LV function.  I last saw the patient 05/2017 at which time she was having some chest and arm pain was still smoking blood sugars were running high.  NST 05/2017 was normal LVEF 57%. The patient was diagnosed with ulcerative colitis a year ago and was started on Humira shots with improvement of her symptoms. She was recently in the ER with symptoms of UTI and she was diagnosed with pyelonephritis.  She has noticed progressively worsening chest discomfort radiating to her right arm and her neck associated with nausea.  The symptoms resemble her symptoms prior to having 2 stents placed in 2012.  She has alsoworsening dyspnea on exertion in about the last 2 months.  She feels like her episodes are now becoming more frequent.  Past Medical History:  Diagnosis Date  . Allergic rhinitis   . Asthma   . Atopic dermatitis   . CAD (coronary artery disease)    a. 12/13/10: mRCA 99%, EF 65-70%;  s/p BMS to mRCA  . COPD (chronic obstructive pulmonary disease) ([place])   . Depression   . DM2 (diabetes mellitus, type 2) ([place])   . GERD (gastroesophageal reflux disease)   . GI bleed   . HTN (hypertension)   . Hyperlipidemia   . Hyperlipidemia   . Hypothyroidism   . Psoriasis   . Seasonal allergies   . Tobacco abuse     Past Surgical History:  Procedure Laterality Date  . BIOPSY  10/28/2018   Procedure: BIOPSY;  Surgeon: Juanita Craver, MD;  Location: [facility];  Service: Endoscopy;;  . CARDIAC CATHETERIZATION N/A 08/14/2015    Procedure: Left Heart Cath and Coronary Angiography;  Surgeon: Peter M Martinique, MD;  Location: [facility];  Service: Cardiovascular;  Laterality: N/A;  . CARPAL TUNNEL RELEASE     w/ bone spurs  . CHOLECYSTECTOMY N/A 08/15/2015   Procedure: LAPAROSCOPIC CHOLECYSTECTOMY WITH ATTEMPTED INTRAOPERATIVE CHOLANGIOGRAM;  Surgeon: Erroll Luna, MD;  Location: [facility];  Service: General;  Laterality: N/A;  . COLONOSCOPY WITH PROPOFOL N/A 10/28/2018   Procedure: COLONOSCOPY WITH PROPOFOL;  Surgeon: Juanita Craver, MD;  Location: [facility];  Service: Endoscopy;  Laterality: N/A;  . CORONARY ANGIOPLASTY WITH STENT PLACEMENT    . DILATION AND CURETTAGE OF UTERUS      Current Medications: Current Meds  Medication Sig  . acetaminophen (TYLENOL) 325 MG tablet Take 2 tablets (650 mg total) by mouth every 6 (six) hours as needed.  Marland Kitchen albuterol (PROVENTIL HFA;VENTOLIN HFA) 108 (90 Base) MCG/ACT inhaler Inhale 1-2 puffs into the lungs every 6 (six) hours as needed for wheezing or shortness of breath.  Marland Kitchen aspirin 81 MG chewable tablet Chew 81 mg by mouth every morning.   Marland Kitchen atorvastatin (LIPITOR) 80 MG tablet Take 1 tablet by mouth once daily  . betamethasone dipropionate (DIPROLENE) 0.05 % cream Apply topically 2 (two) times daily.  Marland Kitchen buPROPion (WELLBUTRIN SR) 150 MG 12 hr tablet Take 1 tablet (150 mg total) by mouth 2 (two) times  daily.  . cetirizine (ZYRTEC ALLERGY) 10 MG tablet Take 1 tablet (10 mg total) by mouth daily.  . ciprofloxacin (CIPRO) 500 MG tablet Take 1 tablet (500 mg total) by mouth every 12 (twelve) hours for 7 days.  . fluticasone (FLONASE) 50 MCG/ACT nasal spray Place 2 sprays into both nostrils daily.  Marland Kitchen glipiZIDE (GLUCOTROL) 5 MG tablet Take 1 tablet (5 mg total) by mouth daily.  Marland Kitchen glucose blood (CONTOUR TEST) test strip Use as instructed  . levothyroxine (SYNTHROID) 150 MCG tablet Take 1 tablet (150 mcg total) by mouth daily before breakfast.  . loratadine (CLARITIN) 10 MG tablet Take  1 tablet (10 mg total) by mouth daily.  Marland Kitchen losartan (COZAAR) 100 MG tablet Take 1 tablet (100 mg total) by mouth at bedtime.  . metFORMIN (GLUCOPHAGE) 1000 MG tablet Take 1 tablet by mouth twice daily  . metoprolol succinate (TOPROL-XL) 100 MG 24 hr tablet TAKE 1 TABLET BY MOUTH ONCE DAILY WITH  OR  IMMEDIATELY  FOLLOWING  A  MEAL  . Microlet Lancets MISC Check CBG once daily  . nitroGLYCERIN (NITROSTAT) 0.4 MG SL tablet Place 1 tablet (0.4 mg total) under the tongue every 5 (five) minutes as needed for chest pain.  Marland Kitchen omeprazole (PRILOSEC) 40 MG capsule Take 1 capsule by mouth once daily  . ondansetron (ZOFRAN) 4 MG tablet Take 1 tablet (4 mg total) by mouth every 8 (eight) hours as needed for nausea or vomiting.  Marland Kitchen rOPINIRole (REQUIP) 1 MG tablet TAKE 1 TABLET BY MOUTH AT BEDTIME  . sertraline (ZOLOFT) 100 MG tablet TAKE 1 & 1/2 (ONE & ONE-HALF) TABLETS BY MOUTH ONCE DAILY  . Spacer/Aero-Holding Chambers (AEROCHAMBER PLUS) inhaler Use as instructed  . triamcinolone cream (KENALOG) 0.1 % Apply 1 application topically 2 (two) times daily.     Allergies:   Gabapentin, Nsaids, Opium, and Tramadol hcl   Social History   Socioeconomic History  . Marital status: Married    Spouse name: Not on file  . Number of children: 0  . Years of education: 10th grade  . Highest education level: Not on file  Occupational History  . Occupation: Conservation officer, [employer]    Comment: self-employed  . 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 Smoking status: Current Every Day Smoker    Packs/day: 0.30    Years: 40.00    Pack years: 12.00    Types: Cigarettes    Last attempt to quit: 05/19/2014    Years since quitting: 5.4  . Smokeless tobacco: Never Used  . Tobacco comment: 2 cigs/day.  Vaping Use  . Vaping Use: Some days  Substance and Sexual Activity  . Alcohol use: No    Alcohol/week: 0.0 standard drinks  . Drug use: Yes    Frequency: 7.0 times per week    Types: Marijuana    Comment:  Marijuana Use  . Sexual activity: Not on file  Other Topics Concern  . Not on file  Social History Narrative   Father died of suicide (gunshot) in 76. 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       Patient lives with her husband, her husband's niece Tammy who is mentally retarded and her best friend and the friend's daughter and 6 dogs               Social Determinants of  Health   Financial Resource Strain:   . Difficulty of Paying Living Expenses: Not on file  Food Insecurity:   . Worried About Charity fundraiser in the Last Year: Not on file  . Ran Out of Food in the Last Year: Not on file  Transportation Needs:   . Lack of Transportation (Medical): Not on file  . Lack of Transportation (Non-Medical): Not on file  Physical Activity:   . Days of Exercise per Week: Not on file  . Minutes of Exercise per Session: Not on file  Stress:   . Feeling of Stress : Not on file  Social Connections:   . Frequency of Communication with Friends and Family: Not on file  . Frequency of Social Gatherings with Friends and Family: Not on file  . Attends Religious Services: Not on file  . Active Member of Clubs or Organizations: Not on file  . Attends Archivist Meetings: Not on file  . Marital Status: Not on file    Family History:  The patient's   family history includes Diabetes in her mother; Hypertension in her mother.   ROS:   Please see the history of present illness.    ROS All other systems reviewed and are negative.  PHYSICAL EXAM:   VS:  BP 134/74   Pulse (!) 101   Ht 5' 3"  (1.6 m)   Wt 243 lb 12.8 oz (110.6 kg)   SpO2 97%   BMI 43.19 kg/m   Physical Exam  RJJ:OACZY, in no acute distress   Neck: no JVD, carotid bruits, or masses Cardiac:RRR; no murmurs, rubs, or gallops  Respiratory:  clear to auscultation bilaterally, normal work of breathing GI: soft, nontender,  nondistended, + BS Ext: without cyanosis, clubbing, or edema, Good distal pulses bilaterally MS: no deformity or atrophy  Skin: warm and dry, no rash Neuro:  Alert and Oriented x 3 Psych: euthymic mood, full affect  Wt Readings from Last 3 Encounters:  11/15/19 243 lb 12.8 oz (110.6 kg)  11/11/19 245 lb 6.4 oz (111.3 kg)  10/26/19 248 lb 9.6 oz (112.8 kg)    Studies/Labs Reviewed:   EKG:  EKG is  ordered today.  The ekg ordered today demonstrates sinus rhythm 89 bpm, unchanged from prior, this was personally reviewed.  Recent Labs: 12/02/2018: Magnesium 1.3 10/26/2019: TSH 10.100 11/10/2019: BUN 12; Creatinine, Ser 1.08; Hemoglobin 14.4; Platelets 223; Potassium 3.8; Sodium 135   Lipid Panel    Component Value Date/Time   CHOL 114 09/02/2018 1350   CHOL 123 05/25/2017 0800   TRIG 93 09/02/2018 1350   HDL 32 (L) 09/02/2018 1350   HDL 31 (L) 05/25/2017 0800   CHOLHDL 3.6 09/02/2018 1350   VLDL 19 09/02/2018 1350   LDLCALC 63 09/02/2018 1350   LDLCALC 59 05/25/2017 0800   LDLDIRECT 142.4 03/21/2011 0843   Additional studies/ records that were reviewed today include:   NST 05/2017  Nuclear stress EF: 57%. The left ventricular ejection fraction is normal (55-65%). The mid anterior and anterolateral wall contracts normally.  Defect 1: There is a small fixed defect of mild severity present in the mid anterior and mid anterolateral location. This appears to be most consistent with breast artifact.  The study is normal.  This is a low risk study.   Cardiac cath 08/2015   Conclusion     Ost Cx to Prox Cx lesion, 10% stenosed.  The left ventricular systolic function is normal.   1. Nonobstructive CAD. The  stent in the RCA is widely patent. 2. Normal LV function   Plan: she is clear to proceed with cholecystectomy from a cardiac standpoint.       Conclusion     Ost Cx to Prox Cx lesion, 10% stenosed.  The left ventricular systolic function is normal.   1.  Nonobstructive CAD. The stent in the RCA is widely patent. 2. Normal LV function   Plan: she is clear to proceed with cholecystectomy from a cardiac standpoint.       FINAL ASSESSMENT:  Successful stenting of the mid right coronary artery with a bare-metal stent.   PLAN:  Continue on aspirin and Plavix.        ______________________________ Peter M. Martinique, M.D.    Nuclear stress test 10/2013   Impression Exercise Capacity:  Poor exercise capacity. BP Response:  Normal blood pressure response. Patient was hypotensive during Stage 1, then became hypertensive. Clinical Symptoms:  Significant dyspnea ECG Impression:  No significant ST segment change suggestive of ischemia. Comparison with Prior Nuclear Study: No previous nuclear study performed   Overall Impression:  Low risk stress nuclear study with fixed inferior, apical and anterior attenuation artifacts despite 2 day imaging. Poor exercise capacity..   LV Ejection Fraction: 53%.  LV Wall Motion:  NL LV Function; NL Wall Motion    ASSESSMENT:    1. Coronary artery disease involving native coronary artery of native heart without angina pectoris   2. Chest pain of uncertain etiology   3. Essential hypertension   4. Hyperlipidemia, unspecified hyperlipidemia type    PLAN:  In order of problems listed above:  CAD status post BMS x2 to the RCA in 2012, cardiac cath 2017 patent stents nonobstructive disease with normal LVEF, she now has typical symptoms similar to ones she had prior to his stents placement in 2012 with chest pain jaw pain radiating to her right arm, we will arrange for cardiac catheterization.  Essential hypertension -controlled on current regimen, slightly elevated today but controlled at home.  Hyperlipidemia LDL 63, HDL 32 and triglycerides 93 on 09/02/2018 on Lipitor 80 mg daily that she tolerates well.  DM type II hemoglobin A1c 6.8% in August 2021  Tobacco abuse-quit 09/11/18, she is congratulated on  that.  Morbid obesity exercise and weight loss recommended.  Medication Adjustments/Labs and Tests Ordered: Current medicines are reviewed at length with the patient today.  Concerns regarding medicines are outlined above.  Medication changes, Labs and Tests ordered today are listed in the Patient Instructions below. Patient Instructions  Medication Instructions:   Your physician recommends that you continue on your current medications as directed. Please refer to the Current Medication list given to you today.  *If you need a refill on your cardiac medications before your next appointment, please call your pharmacy*  Testing/Procedures:  Your physician has requested that you have a cardiac catheterization. Cardiac catheterization is used to diagnose and/or treat various heart conditions. Doctors may recommend this procedure for a number of different reasons. The most common reason is to evaluate chest pain. Chest pain can be a symptom of coronary artery disease (CAD), and cardiac catheterization can show whether plaque is narrowing or blocking your heart's arteries. This procedure is also used to evaluate the valves, as well as measure the blood flow and oxygen levels in different parts of your heart. For further information please visit [email or web address]. Please follow instruction sheet, as given.  YOUR CARDIAC CATH IS SCHEDULED FOR NEXT Wednesday 11/23/19 AT 8 AM FOR  DR. Angelena Form TO DO.  PLEASE FOLLOW THE CATH INSTRUCTIONS PROVIDED TO YOU AT TODAY'S VISIT TO PREPARE FOR THIS PROCEDURE  Due to recent COVID-19 restrictions implemented by our local and state authorities and in an effort to keep both patients and staff as safe as possible, our hospital system requires COVID-19 testing prior to certain scheduled hospital procedures.  Please go to [place]. [place], [place] [id number] on 11/21/19 at 1:50 PM  .  This is a drive up testing site.  You will not need to exit your vehicle.  You will  not be billed at the time of testing but may receive a bill later depending on your insurance.  The approximate cost of the test is $100.  You must agree to self-quarantine from the time of your testing until the procedure date on 11/23/19.  This should included staying home with ONLY the people you live with.  Avoid take-out, grocery store shopping or leaving the house for any non-emergent reason.  Failure to have your COVID-19 test done on the date and time you have been scheduled will result in cancellation of your procedure.  Please call our office at [phone] if you have any questions.  Follow-Up:  WILL BE ARRANGED AT [facility] FROM YOUR CARDIAC CATH      Signed, Ena Dawley, MD  11/15/2019 5:51 PM    [facility] [place], , [place]  [address] Phone: [phone]; Fax: [phone]

## 2019-11-15 NOTE — H&P (View-Only) (Signed)
Cardiology Office Note    Date:  11/15/2019   ID:  Hesper Venturella, DOB 1961-03-05, MRN [id number]  PCP:  [name], MD  Cardiologist: Ena Dawley, MD EPS: None  Chief complain: Chest pain  History of Present Illness:  Kendra Adams is a 59 y.o. female with history of CAD status post 2 BMS to the RCA 12/2010, HLD, tobacco abuse, DM type II, hypertension.  Repeat cardiac cath 08/14/2015 nonobstructive CAD with stent to the RCA widely patent with normal LV function.  I last saw the patient 05/2017 at which time she was having some chest and arm pain was still smoking blood sugars were running high.  NST 05/2017 was normal LVEF 57%. The patient was diagnosed with ulcerative colitis a year ago and was started on Humira shots with improvement of her symptoms. She was recently in the ER with symptoms of UTI and she was diagnosed with pyelonephritis.  She has noticed progressively worsening chest discomfort radiating to her right arm and her neck associated with nausea.  The symptoms resemble her symptoms prior to having 2 stents placed in 2012.  She has alsoworsening dyspnea on exertion in about the last 2 months.  She feels like her episodes are now becoming more frequent.  Past Medical History:  Diagnosis Date  . Allergic rhinitis   . Asthma   . Atopic dermatitis   . CAD (coronary artery disease)    a. 12/13/10: mRCA 99%, EF 65-70%;  s/p BMS to mRCA  . COPD (chronic obstructive pulmonary disease) ([place])   . Depression   . DM2 (diabetes mellitus, type 2) ([place])   . GERD (gastroesophageal reflux disease)   . GI bleed   . HTN (hypertension)   . Hyperlipidemia   . Hyperlipidemia   . Hypothyroidism   . Psoriasis   . Seasonal allergies   . Tobacco abuse     Past Surgical History:  Procedure Laterality Date  . BIOPSY  10/28/2018   Procedure: BIOPSY;  Surgeon: Juanita Craver, MD;  Location: [facility];  Service: Endoscopy;;  . CARDIAC CATHETERIZATION N/A 08/14/2015    Procedure: Left Heart Cath and Coronary Angiography;  Surgeon: Peter M Martinique, MD;  Location: [facility];  Service: Cardiovascular;  Laterality: N/A;  . CARPAL TUNNEL RELEASE     w/ bone spurs  . CHOLECYSTECTOMY N/A 08/15/2015   Procedure: LAPAROSCOPIC CHOLECYSTECTOMY WITH ATTEMPTED INTRAOPERATIVE CHOLANGIOGRAM;  Surgeon: Erroll Luna, MD;  Location: [facility];  Service: General;  Laterality: N/A;  . COLONOSCOPY WITH PROPOFOL N/A 10/28/2018   Procedure: COLONOSCOPY WITH PROPOFOL;  Surgeon: Juanita Craver, MD;  Location: [facility];  Service: Endoscopy;  Laterality: N/A;  . CORONARY ANGIOPLASTY WITH STENT PLACEMENT    . DILATION AND CURETTAGE OF UTERUS      Current Medications: Current Meds  Medication Sig  . acetaminophen (TYLENOL) 325 MG tablet Take 2 tablets (650 mg total) by mouth every 6 (six) hours as needed.  Marland Kitchen albuterol (PROVENTIL HFA;VENTOLIN HFA) 108 (90 Base) MCG/ACT inhaler Inhale 1-2 puffs into the lungs every 6 (six) hours as needed for wheezing or shortness of breath.  Marland Kitchen aspirin 81 MG chewable tablet Chew 81 mg by mouth every morning.   Marland Kitchen atorvastatin (LIPITOR) 80 MG tablet Take 1 tablet by mouth once daily  . betamethasone dipropionate (DIPROLENE) 0.05 % cream Apply topically 2 (two) times daily.  Marland Kitchen buPROPion (WELLBUTRIN SR) 150 MG 12 hr tablet Take 1 tablet (150 mg total) by mouth 2 (two) times  daily.  . cetirizine (ZYRTEC ALLERGY) 10 MG tablet Take 1 tablet (10 mg total) by mouth daily.  . ciprofloxacin (CIPRO) 500 MG tablet Take 1 tablet (500 mg total) by mouth every 12 (twelve) hours for 7 days.  . fluticasone (FLONASE) 50 MCG/ACT nasal spray Place 2 sprays into both nostrils daily.  Marland Kitchen glipiZIDE (GLUCOTROL) 5 MG tablet Take 1 tablet (5 mg total) by mouth daily.  Marland Kitchen glucose blood (CONTOUR TEST) test strip Use as instructed  . levothyroxine (SYNTHROID) 150 MCG tablet Take 1 tablet (150 mcg total) by mouth daily before breakfast.  . loratadine (CLARITIN) 10 MG tablet Take  1 tablet (10 mg total) by mouth daily.  Marland Kitchen losartan (COZAAR) 100 MG tablet Take 1 tablet (100 mg total) by mouth at bedtime.  . metFORMIN (GLUCOPHAGE) 1000 MG tablet Take 1 tablet by mouth twice daily  . metoprolol succinate (TOPROL-XL) 100 MG 24 hr tablet TAKE 1 TABLET BY MOUTH ONCE DAILY WITH  OR  IMMEDIATELY  FOLLOWING  A  MEAL  . Microlet Lancets MISC Check CBG once daily  . nitroGLYCERIN (NITROSTAT) 0.4 MG SL tablet Place 1 tablet (0.4 mg total) under the tongue every 5 (five) minutes as needed for chest pain.  Marland Kitchen omeprazole (PRILOSEC) 40 MG capsule Take 1 capsule by mouth once daily  . ondansetron (ZOFRAN) 4 MG tablet Take 1 tablet (4 mg total) by mouth every 8 (eight) hours as needed for nausea or vomiting.  Marland Kitchen rOPINIRole (REQUIP) 1 MG tablet TAKE 1 TABLET BY MOUTH AT BEDTIME  . sertraline (ZOLOFT) 100 MG tablet TAKE 1 & 1/2 (ONE & ONE-HALF) TABLETS BY MOUTH ONCE DAILY  . Spacer/Aero-Holding Chambers (AEROCHAMBER PLUS) inhaler Use as instructed  . triamcinolone cream (KENALOG) 0.1 % Apply 1 application topically 2 (two) times daily.     Allergies:   Gabapentin, Nsaids, Opium, and Tramadol hcl   Social History   Socioeconomic History  . Marital status: Married    Spouse name: Not on file  . Number of children: 0  . Years of education: 10th grade  . Highest education level: Not on file  Occupational History  . Occupation: Conservation officer, nature    Comment: self-employed  . 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 Smoking status: Current Every Day Smoker    Packs/day: 0.30    Years: 40.00    Pack years: 12.00    Types: Cigarettes    Last attempt to quit: 05/19/2014    Years since quitting: 5.4  . Smokeless tobacco: Never Used  . Tobacco comment: 2 cigs/day.  Vaping Use  . Vaping Use: Some days  Substance and Sexual Activity  . Alcohol use: No    Alcohol/week: 0.0 standard drinks  . Drug use: Yes    Frequency: 7.0 times per week    Types: Marijuana    Comment:  Marijuana Use  . Sexual activity: Not on file  Other Topics Concern  . Not on file  Social History Narrative   Father died of suicide (gunshot) in 57. 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       Patient lives with her husband, her husband's niece Tammy who is mentally retarded and her best friend and the friend's daughter and 6 dogs               Social Determinants of  Health   Financial Resource Strain:   . Difficulty of Paying Living Expenses: Not on file  Food Insecurity:   . Worried About Charity fundraiser in the Last Year: Not on file  . Ran Out of Food in the Last Year: Not on file  Transportation Needs:   . Lack of Transportation (Medical): Not on file  . Lack of Transportation (Non-Medical): Not on file  Physical Activity:   . Days of Exercise per Week: Not on file  . Minutes of Exercise per Session: Not on file  Stress:   . Feeling of Stress : Not on file  Social Connections:   . Frequency of Communication with Friends and Family: Not on file  . Frequency of Social Gatherings with Friends and Family: Not on file  . Attends Religious Services: Not on file  . Active Member of Clubs or Organizations: Not on file  . Attends Archivist Meetings: Not on file  . Marital Status: Not on file    Family History:  The patient's   family history includes Diabetes in her mother; Hypertension in her mother.   ROS:   Please see the history of present illness.    ROS All other systems reviewed and are negative.  PHYSICAL EXAM:   VS:  BP 134/74   Pulse (!) 101   Ht 5' 3"  (1.6 m)   Wt 243 lb 12.8 oz (110.6 kg)   SpO2 97%   BMI 43.19 kg/m   Physical Exam  NUU:VOZDG, in no acute distress   Neck: no JVD, carotid bruits, or masses Cardiac:RRR; no murmurs, rubs, or gallops  Respiratory:  clear to auscultation bilaterally, normal work of breathing GI: soft, nontender,  nondistended, + BS Ext: without cyanosis, clubbing, or edema, Good distal pulses bilaterally MS: no deformity or atrophy  Skin: warm and dry, no rash Neuro:  Alert and Oriented x 3 Psych: euthymic mood, full affect  Wt Readings from Last 3 Encounters:  11/15/19 243 lb 12.8 oz (110.6 kg)  11/11/19 245 lb 6.4 oz (111.3 kg)  10/26/19 248 lb 9.6 oz (112.8 kg)    Studies/Labs Reviewed:   EKG:  EKG is  ordered today.  The ekg ordered today demonstrates sinus rhythm 89 bpm, unchanged from prior, this was personally reviewed.  Recent Labs: 12/02/2018: Magnesium 1.3 10/26/2019: TSH 10.100 11/10/2019: BUN 12; Creatinine, Ser 1.08; Hemoglobin 14.4; Platelets 223; Potassium 3.8; Sodium 135   Lipid Panel    Component Value Date/Time   CHOL 114 09/02/2018 1350   CHOL 123 05/25/2017 0800   TRIG 93 09/02/2018 1350   HDL 32 (L) 09/02/2018 1350   HDL 31 (L) 05/25/2017 0800   CHOLHDL 3.6 09/02/2018 1350   VLDL 19 09/02/2018 1350   LDLCALC 63 09/02/2018 1350   LDLCALC 59 05/25/2017 0800   LDLDIRECT 142.4 03/21/2011 0843   Additional studies/ records that were reviewed today include:   NST 05/2017  Nuclear stress EF: 57%. The left ventricular ejection fraction is normal (55-65%). The mid anterior and anterolateral wall contracts normally.  Defect 1: There is a small fixed defect of mild severity present in the mid anterior and mid anterolateral location. This appears to be most consistent with breast artifact.  The study is normal.  This is a low risk study.   Cardiac cath 08/2015   Conclusion     Ost Cx to Prox Cx lesion, 10% stenosed.  The left ventricular systolic function is normal.   1. Nonobstructive CAD. The  stent in the RCA is widely patent. 2. Normal LV function   Plan: she is clear to proceed with cholecystectomy from a cardiac standpoint.       Conclusion     Ost Cx to Prox Cx lesion, 10% stenosed.  The left ventricular systolic function is normal.   1.  Nonobstructive CAD. The stent in the RCA is widely patent. 2. Normal LV function   Plan: she is clear to proceed with cholecystectomy from a cardiac standpoint.       FINAL ASSESSMENT:  Successful stenting of the mid right coronary artery with a bare-metal stent.   PLAN:  Continue on aspirin and Plavix.        ______________________________ Peter M. Martinique, M.D.    Nuclear stress test 10/2013   Impression Exercise Capacity:  Poor exercise capacity. BP Response:  Normal blood pressure response. Patient was hypotensive during Stage 1, then became hypertensive. Clinical Symptoms:  Significant dyspnea ECG Impression:  No significant ST segment change suggestive of ischemia. Comparison with Prior Nuclear Study: No previous nuclear study performed   Overall Impression:  Low risk stress nuclear study with fixed inferior, apical and anterior attenuation artifacts despite 2 day imaging. Poor exercise capacity..   LV Ejection Fraction: 53%.  LV Wall Motion:  NL LV Function; NL Wall Motion    ASSESSMENT:    1. Coronary artery disease involving native coronary artery of native heart without angina pectoris   2. Chest pain of uncertain etiology   3. Essential hypertension   4. Hyperlipidemia, unspecified hyperlipidemia type    PLAN:  In order of problems listed above:  CAD status post BMS x2 to the RCA in 2012, cardiac cath 2017 patent stents nonobstructive disease with normal LVEF, she now has typical symptoms similar to ones she had prior to his stents placement in 2012 with chest pain jaw pain radiating to her right arm, we will arrange for cardiac catheterization.  Essential hypertension -controlled on current regimen, slightly elevated today but controlled at home.  Hyperlipidemia LDL 63, HDL 32 and triglycerides 93 on 09/02/2018 on Lipitor 80 mg daily that she tolerates well.  DM type II hemoglobin A1c 6.8% in August 2021  Tobacco abuse-quit 09/11/18, she is congratulated on  that.  Morbid obesity exercise and weight loss recommended.  Medication Adjustments/Labs and Tests Ordered: Current medicines are reviewed at length with the patient today.  Concerns regarding medicines are outlined above.  Medication changes, Labs and Tests ordered today are listed in the Patient Instructions below. Patient Instructions  Medication Instructions:   Your physician recommends that you continue on your current medications as directed. Please refer to the Current Medication list given to you today.  *If you need a refill on your cardiac medications before your next appointment, please call your pharmacy*  Testing/Procedures:  Your physician has requested that you have a cardiac catheterization. Cardiac catheterization is used to diagnose and/or treat various heart conditions. Doctors may recommend this procedure for a number of different reasons. The most common reason is to evaluate chest pain. Chest pain can be a symptom of coronary artery disease (CAD), and cardiac catheterization can show whether plaque is narrowing or blocking your heart's arteries. This procedure is also used to evaluate the valves, as well as measure the blood flow and oxygen levels in different parts of your heart. For further information please visit [facility]uge[facility]iest[facility]tn. Please follow instruction sheet, as given.  YOUR CARDIAC CATH IS SCHEDULED FOR NEXT Wednesday 11/23/19 AT 8 AM FOR  DR. Angelena Form TO DO.  PLEASE FOLLOW THE CATH INSTRUCTIONS PROVIDED TO YOU AT TODAY'S VISIT TO PREPARE FOR THIS PROCEDURE  Due to recent COVID-19 restrictions implemented by our local and state authorities and in an effort to keep both patients and staff as safe as possible, our hospital system requires COVID-19 testing prior to certain scheduled hospital procedures.  Please go to [facility]. [place], [place] [address] on 11/21/19 at 1:50 PM  .  This is a drive up testing site.  You will not need to exit your vehicle.  You will  not be billed at the time of testing but may receive a bill later depending on your insurance.  The approximate cost of the test is $100.  You must agree to self-quarantine from the time of your testing until the procedure date on 11/23/19.  This should included staying home with ONLY the people you live with.  Avoid take-out, grocery store shopping or leaving the house for any non-emergent reason.  Failure to have your COVID-19 test done on the date and time you have been scheduled will result in cancellation of your procedure.  Please call our office at [phone] if you have any questions.  Follow-Up:  WILL BE ARRANGED AT [facility] FROM YOUR CARDIAC CATH      Signed, Ena Dawley, MD  11/15/2019 5:51 PM    [facility] [address], [place], [place]  [address] Phone: [phone]; Fax: [phone]

## 2019-11-15 NOTE — Patient Instructions (Addendum)
Medication Instructions:   Your physician recommends that you continue on your current medications as directed. Please refer to the Current Medication list given to you today.  *If you need a refill on your cardiac medications before your next appointment, please call your pharmacy*  Testing/Procedures:  Your physician has requested that you have a cardiac catheterization. Cardiac catheterization is used to diagnose and/or treat various heart conditions. Doctors may recommend this procedure for a number of different reasons. The most common reason is to evaluate chest pain. Chest pain can be a symptom of coronary artery disease (CAD), and cardiac catheterization can show whether plaque is narrowing or blocking your heart's arteries. This procedure is also used to evaluate the valves, as well as measure the blood flow and oxygen levels in different parts of your heart. For further information please visit [facility][email or web address]. Please follow instruction sheet, as given.  YOUR CARDIAC CATH IS SCHEDULED FOR NEXT Wednesday 11/23/19 AT 8 AM FOR DR. MCALHANY TO DO.  PLEASE FOLLOW THE CATH INSTRUCTIONS PROVIDED TO YOU AT TODAY'S VISIT TO PREPARE FOR THIS PROCEDURE  Due to recent COVID-19 restrictions implemented by our local and state authorities and in an effort to keep both patients and staff as safe as possible, our hospital system requires COVID-19 testing prior to certain scheduled hospital procedures.  Please go to [facility]. [place], [place] [address] on 11/21/19 at 1:50 PM  .  This is a drive up testing site.  You will not need to exit your vehicle.  You will not be billed at the time of testing but may receive a bill later depending on your insurance.  The approximate cost of the test is $100.  You must agree to self-quarantine from the time of your testing until the procedure date on 11/23/19.  This should included staying home with ONLY the people you live with.  Avoid take-out, grocery store shopping  or leaving the house for any non-emergent reason.  Failure to have your COVID-19 test done on the date and time you have been scheduled will result in cancellation of your procedure.  Please call our office at [phone] if you have any questions.  Follow-Up:  WILL BE ARRANGED AT DISCHARGE FROM YOUR CARDIAC CATH

## 2019-11-21 ENCOUNTER — Telehealth: Payer: Self-pay | Admitting: *Deleted

## 2019-11-21 ENCOUNTER — Other Ambulatory Visit (HOSPITAL_COMMUNITY)
Admission: RE | Admit: 2019-11-21 | Discharge: 2019-11-21 | Disposition: A | Discharge status: Home / Self Care | Payer: Self-pay | Source: Ambulatory Visit | Attending: Cardiovascular Disease | Admitting: Cardiovascular Disease

## 2019-11-21 DIAGNOSIS — Z20822 Contact with and (suspected) exposure to covid-19: Secondary | ICD-10-CM | POA: Insufficient documentation

## 2019-11-21 DIAGNOSIS — Z01812 Encounter for preprocedural laboratory examination: Secondary | ICD-10-CM | POA: Insufficient documentation

## 2019-11-21 LAB — SARS CORONAVIRUS 2 (TAT 6-24 HRS): SARS Coronavirus 2: NEGATIVE

## 2019-11-21 NOTE — Telephone Encounter (Signed)
Pt contacted pre-catheterization scheduled at [facility] for: Wednesday November 23, 2019 10 AM Verified arrival time and place: [facility]) at: 8 AM   No solid food after midnight prior to cath, clear liquids until 5 AM day of procedure.  Hold: Glipizide-AM of procedure Metformin-day of procedure and 48 hours post procedure Losartan-day before and day of procedure-GFR 56   Except hold medications AM meds can be  taken pre-cath with sips of water including: ASA 81 mg   Confirmed patient has responsible adult to drive home post procedure and be with patient first 24 hours after arriving home:  You are allowed ONE visitor in the waiting room during the time you are at the hospital for your procedure. Both you and your visitor must wear a mask once you enter the hospital.       COVID-19 Pre-Screening Questions:  . In the past 10 days have you had a new cough, shortness of breath, headache, congestion, fever (100 or greater) unexplained body aches, new sore throat, or sudden loss of taste or sense of smell? Marland Kitchen In the past 10 days have you been around anyone with known Covid 19?  Marland Kitchen Have you been vaccinated for COVID-19?    LMTCB to review procedure instructions with patient.  11/10/19 WBC 22.9-pt treated for UTI. I will ask patient if she can go by [facility] for CBC today.

## 2019-11-21 NOTE — Telephone Encounter (Signed)
LMTCB for pt to review instructions

## 2019-11-21 NOTE — Telephone Encounter (Signed)
Patient is returning call to discuss instructions.

## 2019-11-21 NOTE — Telephone Encounter (Signed)
CALLED PATIENT LVM FOR PATIENT TO RETURN CALL  /   REGARDING REFERRAL TO MY EYE DR.  THEIR OFFICE IS NOT SCHEDULING ANY APPOINTMENT UNTIL November 2021. PATIENT WILL HAVE TO CALL AFTER THEN TO MAKE APPOINTMENT.

## 2019-11-21 NOTE — Telephone Encounter (Signed)
LMTCB to review procedure instructions/arrange CBC prior to procedure.

## 2019-11-22 NOTE — Telephone Encounter (Signed)
LMTCB for pt to review procedure instructions

## 2019-11-22 NOTE — Telephone Encounter (Signed)
No answer, voicemail message. 

## 2019-11-23 ENCOUNTER — Ambulatory Visit (HOSPITAL_COMMUNITY)
Admission: RE | Admit: 2019-11-23 | Discharge: 2019-11-23 | Disposition: A | Discharge status: Home / Self Care | Payer: Self-pay | Attending: Cardiovascular Disease | Admitting: Cardiovascular Disease

## 2019-11-23 ENCOUNTER — Encounter (HOSPITAL_COMMUNITY)
Admission: RE | Disposition: A | Discharge status: Home / Self Care | Payer: Self-pay | Source: Home / Self Care | Attending: Cardiovascular Disease

## 2019-11-23 ENCOUNTER — Other Ambulatory Visit: Payer: Self-pay

## 2019-11-23 DIAGNOSIS — Z833 Family history of diabetes mellitus: Secondary | ICD-10-CM | POA: Insufficient documentation

## 2019-11-23 DIAGNOSIS — Z888 Allergy status to other drugs, medicaments and biological substances status: Secondary | ICD-10-CM | POA: Insufficient documentation

## 2019-11-23 DIAGNOSIS — Z886 Allergy status to analgesic agent status: Secondary | ICD-10-CM | POA: Insufficient documentation

## 2019-11-23 DIAGNOSIS — I2510 Atherosclerotic heart disease of native coronary artery without angina pectoris: Secondary | ICD-10-CM

## 2019-11-23 DIAGNOSIS — Z8249 Family history of ischemic heart disease and other diseases of the circulatory system: Secondary | ICD-10-CM | POA: Insufficient documentation

## 2019-11-23 DIAGNOSIS — Z955 Presence of coronary angioplasty implant and graft: Secondary | ICD-10-CM | POA: Insufficient documentation

## 2019-11-23 DIAGNOSIS — J449 Chronic obstructive pulmonary disease, unspecified: Secondary | ICD-10-CM | POA: Insufficient documentation

## 2019-11-23 DIAGNOSIS — K219 Gastro-esophageal reflux disease without esophagitis: Secondary | ICD-10-CM | POA: Insufficient documentation

## 2019-11-23 DIAGNOSIS — E119 Type 2 diabetes mellitus without complications: Secondary | ICD-10-CM | POA: Insufficient documentation

## 2019-11-23 DIAGNOSIS — E039 Hypothyroidism, unspecified: Secondary | ICD-10-CM | POA: Insufficient documentation

## 2019-11-23 DIAGNOSIS — I10 Essential (primary) hypertension: Secondary | ICD-10-CM | POA: Insufficient documentation

## 2019-11-23 DIAGNOSIS — Z79899 Other long term (current) drug therapy: Secondary | ICD-10-CM | POA: Insufficient documentation

## 2019-11-23 DIAGNOSIS — Z7984 Long term (current) use of oral hypoglycemic drugs: Secondary | ICD-10-CM | POA: Insufficient documentation

## 2019-11-23 DIAGNOSIS — F17210 Nicotine dependence, cigarettes, uncomplicated: Secondary | ICD-10-CM | POA: Insufficient documentation

## 2019-11-23 DIAGNOSIS — F329 Major depressive disorder, single episode, unspecified: Secondary | ICD-10-CM | POA: Insufficient documentation

## 2019-11-23 DIAGNOSIS — E785 Hyperlipidemia, unspecified: Secondary | ICD-10-CM | POA: Insufficient documentation

## 2019-11-23 DIAGNOSIS — Z7982 Long term (current) use of aspirin: Secondary | ICD-10-CM | POA: Insufficient documentation

## 2019-11-23 DIAGNOSIS — R079 Chest pain, unspecified: Secondary | ICD-10-CM | POA: Insufficient documentation

## 2019-11-23 DIAGNOSIS — Z6841 Body Mass Index (BMI) 40.0 and over, adult: Secondary | ICD-10-CM | POA: Insufficient documentation

## 2019-11-23 DIAGNOSIS — Z9049 Acquired absence of other specified parts of digestive tract: Secondary | ICD-10-CM | POA: Insufficient documentation

## 2019-11-23 DIAGNOSIS — Z7902 Long term (current) use of antithrombotics/antiplatelets: Secondary | ICD-10-CM | POA: Insufficient documentation

## 2019-11-23 HISTORY — PX: LEFT HEART CATH AND CORONARY ANGIOGRAPHY: CATH118249

## 2019-11-23 LAB — CBC
HCT: 41.2 % (ref 36.0–46.0)
Hemoglobin: 13.3 g/dL (ref 12.0–15.0)
MCH: 30.3 pg (ref 26.0–34.0)
MCHC: 32.3 g/dL (ref 30.0–36.0)
MCV: 93.8 fL (ref 80.0–100.0)
Platelets: 270 10*3/uL (ref 150–400)
RBC: 4.39 MIL/uL (ref 3.87–5.11)
RDW: 13.2 % (ref 11.5–15.5)
WBC: 10.8 10*3/uL — ABNORMAL HIGH (ref 4.0–10.5)
nRBC: 0 % (ref 0.0–0.2)

## 2019-11-23 LAB — GLUCOSE, CAPILLARY: Glucose-Capillary: 145 mg/dL — ABNORMAL HIGH (ref 70–99)

## 2019-11-23 SURGERY — LEFT HEART CATH AND CORONARY ANGIOGRAPHY
Anesthesia: LOCAL

## 2019-11-23 MED ORDER — FENTANYL CITRATE (PF) 100 MCG/2ML IJ SOLN
INTRAMUSCULAR | Status: AC
  Filled 2019-11-23: qty 2

## 2019-11-23 MED ORDER — HEPARIN (PORCINE) IN NACL 1000-0.9 UT/500ML-% IV SOLN
INTRAVENOUS | Status: DC | PRN
  Administered 2019-11-23 (×2): 500 mL

## 2019-11-23 MED ORDER — SODIUM CHLORIDE 0.9 % IV SOLN: 250.0000 mL | INTRAVENOUS | Status: DC | PRN

## 2019-11-23 MED ORDER — SODIUM CHLORIDE 0.9% FLUSH: 3.0000 mL | INTRAVENOUS | Status: DC | PRN

## 2019-11-23 MED ORDER — SODIUM CHLORIDE 0.9 % WEIGHT BASED INFUSION: 1.0000 mL/kg/h | INTRAVENOUS | Status: DC

## 2019-11-23 MED ORDER — ACETAMINOPHEN 325 MG PO TABS: 650.0000 mg | ORAL_TABLET | ORAL | Status: DC | PRN

## 2019-11-23 MED ORDER — HEPARIN SODIUM (PORCINE) 1000 UNIT/ML IJ SOLN
INTRAMUSCULAR | Status: AC
  Filled 2019-11-23: qty 1

## 2019-11-23 MED ORDER — LABETALOL HCL 5 MG/ML IV SOLN: 10.0000 mg | INTRAVENOUS | Status: DC | PRN

## 2019-11-23 MED ORDER — VERAPAMIL HCL 2.5 MG/ML IV SOLN
INTRAVENOUS | Status: DC | PRN
  Administered 2019-11-23: 10 mL via INTRA_ARTERIAL

## 2019-11-23 MED ORDER — HYDRALAZINE HCL 20 MG/ML IJ SOLN: 10.0000 mg | INTRAMUSCULAR | Status: DC | PRN

## 2019-11-23 MED ORDER — MIDAZOLAM HCL 2 MG/2ML IJ SOLN
INTRAMUSCULAR | Status: AC
  Filled 2019-11-23: qty 2

## 2019-11-23 MED ORDER — HEPARIN SODIUM (PORCINE) 1000 UNIT/ML IJ SOLN
INTRAMUSCULAR | Status: DC | PRN
  Administered 2019-11-23: 5500 [IU] via INTRAVENOUS

## 2019-11-23 MED ORDER — IOHEXOL 350 MG/ML SOLN
INTRAVENOUS | Status: DC | PRN
  Administered 2019-11-23: 70 mL

## 2019-11-23 MED ORDER — SODIUM CHLORIDE 0.9% FLUSH: 3.0000 mL | Freq: Two times a day (BID) | INTRAVENOUS | Status: DC

## 2019-11-23 MED ORDER — HEPARIN (PORCINE) IN NACL 1000-0.9 UT/500ML-% IV SOLN
INTRAVENOUS | Status: AC
  Filled 2019-11-23: qty 1000

## 2019-11-23 MED ORDER — LIDOCAINE HCL (PF) 1 % IJ SOLN
INTRAMUSCULAR | Status: AC
  Filled 2019-11-23: qty 30

## 2019-11-23 MED ORDER — SODIUM CHLORIDE 0.9 % WEIGHT BASED INFUSION
3.0000 mL/kg/h | INTRAVENOUS | Status: AC
  Administered 2019-11-23: 3 mL/kg/h via INTRAVENOUS

## 2019-11-23 MED ORDER — ONDANSETRON HCL 4 MG/2ML IJ SOLN: 4.0000 mg | Freq: Four times a day (QID) | INTRAMUSCULAR | Status: DC | PRN

## 2019-11-23 MED ORDER — SODIUM CHLORIDE 0.9 % IV SOLN: INTRAVENOUS | Status: AC

## 2019-11-23 MED ORDER — FENTANYL CITRATE (PF) 100 MCG/2ML IJ SOLN
INTRAMUSCULAR | Status: DC | PRN
Start: 2019-11-23 — End: 2019-11-23
  Administered 2019-11-23: 50 ug via INTRAVENOUS

## 2019-11-23 MED ORDER — MIDAZOLAM HCL 2 MG/2ML IJ SOLN
INTRAMUSCULAR | Status: DC | PRN
  Administered 2019-11-23: 2 mg via INTRAVENOUS

## 2019-11-23 MED ORDER — LIDOCAINE HCL (PF) 1 % IJ SOLN
INTRAMUSCULAR | Status: DC | PRN
  Administered 2019-11-23: 2 mL

## 2019-11-23 MED ORDER — VERAPAMIL HCL 2.5 MG/ML IV SOLN
INTRAVENOUS | Status: AC
  Filled 2019-11-23: qty 2

## 2019-11-23 MED ORDER — ASPIRIN 81 MG PO CHEW: 81.0000 mg | CHEWABLE_TABLET | ORAL | Status: DC

## 2019-11-23 SURGICAL SUPPLY — 10 items
CATH 5FR JL3.5 JR4 ANG PIG MP (CATHETERS) ×1 IMPLANT
DEVICE RAD COMP TR BAND LRG (VASCULAR PRODUCTS) ×1 IMPLANT
GLIDESHEATH SLEND SS 6F .021 (SHEATH) ×1 IMPLANT
GUIDEWIRE INQWIRE 1.5J.035X260 (WIRE) IMPLANT
INQWIRE 1.5J .035X260CM (WIRE) ×2
KIT HEART LEFT (KITS) ×2 IMPLANT
PACK CARDIAC CATHETERIZATION (CUSTOM PROCEDURE TRAY) ×2 IMPLANT
SYR MEDRAD MARK 7 150ML (SYRINGE) ×2 IMPLANT
TRANSDUCER W/STOPCOCK (MISCELLANEOUS) ×2 IMPLANT
TUBING CIL FLEX 10 FLL-RA (TUBING) ×2 IMPLANT

## 2019-11-23 NOTE — Progress Notes (Signed)
Discharge instructions reviewed with patient and family. Verbalized understanding. 

## 2019-11-23 NOTE — Interval H&P Note (Signed)
History and Physical Interval Note:  11/23/2019 9:22 AM  Kendra Adams  has presented today for surgery, with the diagnosis of chest pain.  The various methods of treatment have been discussed with the patient and family. After consideration of risks, benefits and other options for treatment, the patient has consented to  Procedure(s): LEFT HEART CATH AND CORONARY ANGIOGRAPHY (N/A) as a surgical intervention.  The patient's history has been reviewed, patient examined, no change in status, stable for surgery.  I have reviewed the patient's chart and labs.  Questions were answered to the patient's satisfaction.    Cath Lab Visit (complete for each Cath Lab visit)  Clinical Evaluation Leading to the Procedure:   ACS: No.  Non-ACS:    Anginal Classification: CCS III  Anti-ischemic medical therapy: Minimal Therapy (1 class of medications)  Non-Invasive Test Results: No non-invasive testing performed  Prior CABG: No previous CABG        Lauree Chandler

## 2019-11-23 NOTE — Discharge Instructions (Signed)
Radial Site Care  Hold metformin for 48 hours after procedure.  Drink plenty of fluids for the first 24-48 hours.   This sheet gives you information about how to care for yourself after your procedure. Your health care provider may also give you more specific instructions. If you have problems or questions, contact your health care provider. What can I expect after the procedure? After the procedure, it is common to have:  Bruising and tenderness at the catheter insertion area. Follow these instructions at home: Medicines  Take over-the-counter and prescription medicines only as told by your health care provider. Insertion site care  Follow instructions from your health care provider about how to take care of your insertion site. Make sure you: ? Wash your hands with soap and water before you change your bandage (dressing). If soap and water are not available, use hand sanitizer. ? Change your dressing as told by your health care provider. ? Leave stitches (sutures), skin glue, or adhesive strips in place. These skin closures may need to stay in place for 2 weeks or longer. If adhesive strip edges start to loosen and curl up, you may trim the loose edges. Do not remove adhesive strips completely unless your health care provider tells you to do that.  Check your insertion site every day for signs of infection. Check for: ? Redness, swelling, or pain. ? Fluid or blood. ? Pus or a bad smell. ? Warmth.  Do not take baths, swim, or use a hot tub until your health care provider approves.  You may shower 24-48 hours after the procedure, or as directed by your health care provider. ? Remove the dressing and gently wash the site with plain soap and water. ? Pat the area dry with a clean towel. ? Do not rub the site. That could cause bleeding.  Do not apply powder or lotion to the site. Activity   For 24 hours after the procedure, or as directed by your health care provider: ? Do not  flex or bend the affected arm. ? Do not push or pull heavy objects with the affected arm. ? Do not drive yourself home from the hospital or clinic. You may drive 24 hours after the procedure unless your health care provider tells you not to. ? Do not operate machinery or power tools.  Do not lift anything that is heavier than 10 lb (4.5 kg), or the limit that you are told, until your health care provider says that it is safe.  Ask your health care provider when it is okay to: ? Return to work or school. ? Resume usual physical activities or sports. ? Resume sexual activity. General instructions  If the catheter site starts to bleed, raise your arm and put firm pressure on the site. If the bleeding does not stop, get help right away. This is a medical emergency.  If you went home on the same day as your procedure, a responsible adult should be with you for the first 24 hours after you arrive home.  Keep all follow-up visits as told by your health care provider. This is important. Contact a health care provider if:  You have a fever.  You have redness, swelling, or yellow drainage around your insertion site. Get help right away if:  You have unusual pain at the radial site.  The catheter insertion area swells very fast.  The insertion area is bleeding, and the bleeding does not stop when you hold steady pressure on the  area.  Your arm or hand becomes pale, cool, tingly, or numb. These symptoms may represent a serious problem that is an emergency. Do not wait to see if the symptoms will go away. Get medical help right away. Call your local emergency services (911 in the [place] Do not drive yourself to the hospital. Summary  After the procedure, it is common to have bruising and tenderness at the site.  Follow instructions from your health care provider about how to take care of your radial site wound. Check the wound every day for signs of infection.  Do not lift anything that is  heavier than 10 lb (4.5 kg), or the limit that you are told, until your health care provider says that it is safe. This information is not intended to replace advice given to you by your health care provider. Make sure you discuss any questions you have with your health care provider. Document Revised: 04/01/2017 Document Reviewed: 04/01/2017 Elsevier Patient Education  2020 [employer].

## 2019-11-23 NOTE — Telephone Encounter (Signed)
Pt at hospital for procedure/today at hospital  WBC 10.8

## 2019-11-24 ENCOUNTER — Encounter (HOSPITAL_COMMUNITY): Payer: Self-pay | Admitting: Cardiovascular Disease

## 2019-11-28 ENCOUNTER — Telehealth: Payer: Self-pay | Admitting: *Deleted

## 2019-11-28 NOTE — Telephone Encounter (Signed)
Per Dr Glade Lloyd post cath in office appointment with APP in 2-3 weeks.  I have scheduled in office appointment for her with Ermalinda Barrios, PA Wednesday December 21, 2019 11:45 AM. Call placed to patient to let her know about appointment-LMTCB to let her know about appt, will also send MyChart message.

## 2019-11-29 NOTE — Telephone Encounter (Signed)
LMTCB for patient to discuss follow up appointment in our office.

## 2019-12-01 NOTE — Telephone Encounter (Signed)
LMTCB to discuss follow up appointment in our office.

## 2019-12-05 NOTE — Telephone Encounter (Signed)
LMTCB for pt to notify her of upcoming appt with Ermalinda Barrios, PA 12/21/19 11:45 AM

## 2019-12-07 NOTE — Telephone Encounter (Signed)
Pt confirmed appointment through 11/28/19 My Chart message.

## 2019-12-10 ENCOUNTER — Other Ambulatory Visit: Payer: Self-pay | Admitting: Cardiology

## 2019-12-10 DIAGNOSIS — I10 Essential (primary) hypertension: Secondary | ICD-10-CM

## 2019-12-10 DIAGNOSIS — I2510 Atherosclerotic heart disease of native coronary artery without angina pectoris: Secondary | ICD-10-CM

## 2019-12-20 ENCOUNTER — Emergency Department (HOSPITAL_COMMUNITY): Payer: Self-pay

## 2019-12-20 ENCOUNTER — Encounter (HOSPITAL_COMMUNITY): Payer: Self-pay

## 2019-12-20 ENCOUNTER — Emergency Department (HOSPITAL_COMMUNITY)
Admission: EM | Admit: 2019-12-20 | Discharge: 2019-12-20 | Disposition: A | Discharge status: Home / Self Care | Payer: Self-pay | Attending: Emergency Medicine | Admitting: Emergency Medicine

## 2019-12-20 DIAGNOSIS — S20211A Contusion of right front wall of thorax, initial encounter: Secondary | ICD-10-CM | POA: Insufficient documentation

## 2019-12-20 DIAGNOSIS — W108XXA Fall (on) (from) other stairs and steps, initial encounter: Secondary | ICD-10-CM | POA: Insufficient documentation

## 2019-12-20 DIAGNOSIS — T07XXXA Unspecified multiple injuries, initial encounter: Secondary | ICD-10-CM

## 2019-12-20 DIAGNOSIS — Y92009 Unspecified place in unspecified non-institutional (private) residence as the place of occurrence of the external cause: Secondary | ICD-10-CM | POA: Insufficient documentation

## 2019-12-20 DIAGNOSIS — Z7951 Long term (current) use of inhaled steroids: Secondary | ICD-10-CM | POA: Insufficient documentation

## 2019-12-20 DIAGNOSIS — S50311A Abrasion of right elbow, initial encounter: Secondary | ICD-10-CM | POA: Insufficient documentation

## 2019-12-20 DIAGNOSIS — I10 Essential (primary) hypertension: Secondary | ICD-10-CM | POA: Insufficient documentation

## 2019-12-20 DIAGNOSIS — J449 Chronic obstructive pulmonary disease, unspecified: Secondary | ICD-10-CM | POA: Insufficient documentation

## 2019-12-20 DIAGNOSIS — Z79890 Hormone replacement therapy (postmenopausal): Secondary | ICD-10-CM | POA: Insufficient documentation

## 2019-12-20 DIAGNOSIS — E039 Hypothyroidism, unspecified: Secondary | ICD-10-CM | POA: Insufficient documentation

## 2019-12-20 DIAGNOSIS — Z7984 Long term (current) use of oral hypoglycemic drugs: Secondary | ICD-10-CM | POA: Insufficient documentation

## 2019-12-20 DIAGNOSIS — Z951 Presence of aortocoronary bypass graft: Secondary | ICD-10-CM | POA: Insufficient documentation

## 2019-12-20 DIAGNOSIS — I2510 Atherosclerotic heart disease of native coronary artery without angina pectoris: Secondary | ICD-10-CM | POA: Insufficient documentation

## 2019-12-20 DIAGNOSIS — W19XXXA Unspecified fall, initial encounter: Secondary | ICD-10-CM

## 2019-12-20 DIAGNOSIS — Z7982 Long term (current) use of aspirin: Secondary | ICD-10-CM | POA: Insufficient documentation

## 2019-12-20 DIAGNOSIS — S80211A Abrasion, right knee, initial encounter: Secondary | ICD-10-CM | POA: Insufficient documentation

## 2019-12-20 DIAGNOSIS — Z79899 Other long term (current) drug therapy: Secondary | ICD-10-CM | POA: Insufficient documentation

## 2019-12-20 DIAGNOSIS — F17210 Nicotine dependence, cigarettes, uncomplicated: Secondary | ICD-10-CM | POA: Insufficient documentation

## 2019-12-20 DIAGNOSIS — J45909 Unspecified asthma, uncomplicated: Secondary | ICD-10-CM | POA: Insufficient documentation

## 2019-12-20 DIAGNOSIS — R52 Pain, unspecified: Secondary | ICD-10-CM

## 2019-12-20 DIAGNOSIS — E119 Type 2 diabetes mellitus without complications: Secondary | ICD-10-CM | POA: Insufficient documentation

## 2019-12-20 MED ORDER — OXYCODONE-ACETAMINOPHEN 5-325 MG PO TABS: 1.0000 | ORAL_TABLET | Freq: Four times a day (QID) | ORAL | 0 refills | Status: DC | PRN

## 2019-12-20 MED ORDER — OXYCODONE-ACETAMINOPHEN 5-325 MG PO TABS
1.0000 | ORAL_TABLET | Freq: Once | ORAL | Status: AC
  Administered 2019-12-20: 1 via ORAL
  Filled 2019-12-20: qty 1

## 2019-12-20 NOTE — ED Notes (Signed)
Pt able to ambulate around room with ease

## 2019-12-20 NOTE — Progress Notes (Signed)
Cardiology Office Note    Date:  12/21/2019   ID:  Kendra Adams, DOB [birth year], MRN [id number]  PCP:  [name], MD  Cardiologist: Ena Dawley, MD EPS: None  Chief Complaint  Patient presents with  . Hospitalization Follow-up    History of Present Illness:  Kendra Adams is a 59 y.o. female with history of CAD status post 2 BMS to the RCA 12/2010, HLD, tobacco abuse, DM type II, hypertension.  Repeat cardiac cath 08/14/2015 nonobstructive CAD with stent to the RCA widely patent with normal LV function. NST 05/2017 was normal LVEF 57%.  Patient underwent cardiac catheterization 11/23/2019 for recurrent chest pain found to have patent mid RCA stent with minimal restenosis, mild nonobstructive disease in the circumflex and normal LV function.  Patient in the ER yesterday after tripping and falling on her door mat and fell down 5 stairs and landed on her right chest, Xrays negative. She's very uncomfortable. No chest tightness, just sore from her fall yesterday. Has a lot of post nasal drip and allergy symptoms.   Past Medical History:  Diagnosis Date  . Allergic rhinitis   . Asthma   . Atopic dermatitis   . CAD (coronary artery disease)    a. 12/13/10: mRCA 99%, EF 65-70%;  s/p BMS to mRCA  . COPD (chronic obstructive pulmonary disease) ([place])   . Depression   . DM2 (diabetes mellitus, type 2) ([place])   . GERD (gastroesophageal reflux disease)   . GI bleed   . HTN (hypertension)   . Hyperlipidemia   . Hyperlipidemia   . Hypothyroidism   . Psoriasis   . Seasonal allergies   . Tobacco abuse     Past Surgical History:  Procedure Laterality Date  . BIOPSY  10/28/2018   Procedure: BIOPSY;  Surgeon: Juanita Craver, MD;  Location: [facility];  Service: Endoscopy;;  . CARDIAC CATHETERIZATION N/A 08/14/2015   Procedure: Left Heart Cath and Coronary Angiography;  Surgeon: Peter M Martinique, MD;  Location: [facility];  Service: Cardiovascular;  Laterality: N/A;    . CARPAL TUNNEL RELEASE     w/ bone spurs  . CHOLECYSTECTOMY N/A 08/15/2015   Procedure: LAPAROSCOPIC CHOLECYSTECTOMY WITH ATTEMPTED INTRAOPERATIVE CHOLANGIOGRAM;  Surgeon: Erroll [facility], MD;  Location: [facility];  Service: General;  Laterality: N/A;  . COLONOSCOPY WITH PROPOFOL N/A 10/28/2018   Procedure: COLONOSCOPY WITH PROPOFOL;  Surgeon: Juanita Craver, MD;  Location: [facility];  Service: Endoscopy;  Laterality: N/A;  . CORONARY ANGIOPLASTY WITH STENT PLACEMENT    . DILATION AND CURETTAGE OF UTERUS    . LEFT HEART CATH AND CORONARY ANGIOGRAPHY N/A 11/23/2019   Procedure: LEFT HEART CATH AND CORONARY ANGIOGRAPHY;  Surgeon: Burnell Blanks, MD;  Location: [facility];  Service: Cardiovascular;  Laterality: N/A;    Current Medications: Current Meds  Medication Sig  . acetaminophen (TYLENOL) 325 MG tablet Take 2 tablets (650 mg total) by mouth every 6 (six) hours as needed.  Marland Kitchen albuterol (PROVENTIL HFA;VENTOLIN HFA) 108 (90 Base) MCG/ACT inhaler Inhale 1-2 puffs into the lungs every 6 (six) hours as needed for wheezing or shortness of breath.  Marland Kitchen aspirin 81 MG chewable tablet Chew 81 mg by mouth every morning.   Marland Kitchen atorvastatin (LIPITOR) 80 MG tablet Take 1 tablet by mouth once daily (Patient taking differently: Take 80 mg by mouth daily. )  . betamethasone dipropionate (DIPROLENE) 0.05 % cream Apply topically 2 (two) times daily. (Patient taking differently: Apply 1 application topically  2 (two) times daily as needed (psoriasis). )  . buPROPion (WELLBUTRIN SR) 150 MG 12 hr tablet Take 1 tablet (150 mg total) by mouth 2 (two) times daily.  . cetirizine (ZYRTEC ALLERGY) 10 MG tablet Take 1 tablet (10 mg total) by mouth daily.  . ferrous sulfate 325 (65 FE) MG EC tablet Take 1 tablet (325 mg total) by mouth daily with breakfast.  . fluticasone (FLONASE) 50 MCG/ACT nasal spray Place 2 sprays into both nostrils daily.  Marland Kitchen glipiZIDE (GLUCOTROL) 5 MG tablet Take 1 tablet (5 mg total) by  mouth daily.  Marland Kitchen glucose blood (CONTOUR TEST) test strip Use as instructed  . levothyroxine (SYNTHROID) 150 MCG tablet Take 1 tablet (150 mcg total) by mouth daily before breakfast.  . loratadine (CLARITIN) 10 MG tablet Take 1 tablet (10 mg total) by mouth daily.  Marland Kitchen losartan (COZAAR) 100 MG tablet Take 1 tablet (100 mg total) by mouth at bedtime.  . metFORMIN (GLUCOPHAGE) 1000 MG tablet Take 1 tablet by mouth twice daily (Patient taking differently: Take 1,000 mg by mouth 2 (two) times daily with a meal. )  . metoprolol succinate (TOPROL-XL) 100 MG 24 hr tablet TAKE 1 TABLET BY MOUTH ONCE DAILY WITH  OR  IMMEDIATELY  FOLLOWING  A  MEAL  . Microlet Lancets MISC Check CBG once daily  . nitroGLYCERIN (NITROSTAT) 0.4 MG SL tablet Place 1 tablet (0.4 mg total) under the tongue every 5 (five) minutes as needed for chest pain.  Marland Kitchen omeprazole (PRILOSEC) 40 MG capsule Take 1 capsule by mouth once daily (Patient taking differently: Take 40 mg by mouth daily. )  . ondansetron (ZOFRAN) 4 MG tablet Take 1 tablet (4 mg total) by mouth every 8 (eight) hours as needed for nausea or vomiting.  Marland Kitchen oxyCODONE-acetaminophen (PERCOCET/ROXICET) 5-325 MG tablet Take 1 tablet by mouth every 6 (six) hours as needed for severe pain.  Marland Kitchen rOPINIRole (REQUIP) 1 MG tablet TAKE 1 TABLET BY MOUTH AT BEDTIME (Patient taking differently: Take 1 mg by mouth at bedtime. )  . sertraline (ZOLOFT) 100 MG tablet TAKE 1 & 1/2 (ONE & ONE-HALF) TABLETS BY MOUTH ONCE DAILY (Patient taking differently: Take 150 mg by mouth daily. )  . Spacer/Aero-Holding Chambers (AEROCHAMBER PLUS) inhaler Use as instructed  . triamcinolone cream (KENALOG) 0.1 % Apply 1 application topically 2 (two) times daily.     Allergies:   Nsaids, Opium, Tramadol hcl, and Gabapentin   Social History   Socioeconomic History  . Marital status: Married    Spouse name: Not on file  . Number of children: 0  . Years of education: 10th grade  . Highest education level: Not  on file  Occupational History  . Occupation: Conservation officer, nature    Comment: self-employed  . 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 Smoking status: Current Every Day Smoker    Packs/day: 0.30    Years: 40.00    Pack years: 12.00    Types: Cigarettes    Last attempt to quit: 05/19/2014    Years since quitting: 5.5  . Smokeless tobacco: Never Used  . Tobacco comment: 2 cigs/day.  Vaping Use  . Vaping Use: Some days  Substance and Sexual Activity  . Alcohol use: No    Alcohol/week: 0.0 standard drinks  . Drug use: Yes    Frequency: 7.0 times per week    Types: Marijuana    Comment: Marijuana Use  . Sexual activity: Not on  file  Other Topics Concern  . Not on file  Social History Narrative   Father died of suicide (gunshot) in 44. 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       Patient lives with her husband, her husband's niece Tammy who is mentally retarded and her best friend and the friend's daughter and 6 dogs               Social Determinants of Health   Financial Resource Strain:   . Difficulty of Paying Living Expenses: Not on file  Food Insecurity:   . Worried About Charity fundraiser in the Last Year: Not on file  . Ran Out of Food in the Last Year: Not on file  Transportation Needs:   . Lack of Transportation (Medical): Not on file  . Lack of Transportation (Non-Medical): Not on file  Physical Activity:   . Days of Exercise per Week: Not on file  . Minutes of Exercise per Session: Not on file  Stress:   . Feeling of Stress : Not on file  Social Connections:   . Frequency of Communication with Friends and Family: Not on file  . Frequency of Social Gatherings with Friends and Family: Not on file  . Attends Religious Services: Not on file  . Active Member of Clubs or Organizations: Not on file  . Attends Archivist Meetings: Not on file    . Marital Status: Not on file     Family History:  The patient's family history includes Diabetes in her mother; Hypertension in her mother.   ROS:   Please see the history of present illness.    ROS All other systems reviewed and are negative.   PHYSICAL EXAM:   VS:  BP (!) 146/84   Pulse 76   Ht 5' 3"  (1.6 m)   Wt 250 lb 3.2 oz (113.5 kg)   SpO2 90%   BMI 44.32 kg/m   Physical Exam  GEN: Obese, in no acute distress  Neck: no JVD, carotid bruits, or masses Cardiac:RRR; no murmurs, rubs, or gallops  Respiratory:  clear to auscultation bilaterally, normal work of breathing GI: soft, nontender, nondistended, + BS Ext: without cyanosis, clubbing, or edema, Good distal pulses bilaterally Neuro:  Alert and Oriented x 3 Psych: euthymic mood, full affect  Wt Readings from Last 3 Encounters:  12/21/19 250 lb 3.2 oz (113.5 kg)  12/20/19 242 lb (109.8 kg)  11/23/19 246 lb (111.6 kg)      Studies/Labs Reviewed:   EKG:  EKG is not ordered today.    Recent Labs: 10/26/2019: TSH 10.100 11/10/2019: BUN 12; Creatinine, Ser 1.08; Potassium 3.8; Sodium 135 11/23/2019: Hemoglobin 13.3; Platelets 270   Lipid Panel    Component Value Date/Time   CHOL 114 09/02/2018 1350   CHOL 123 05/25/2017 0800   TRIG 93 09/02/2018 1350   HDL 32 (L) 09/02/2018 1350   HDL 31 (L) 05/25/2017 0800   CHOLHDL 3.6 09/02/2018 1350   VLDL 19 09/02/2018 1350   LDLCALC 63 09/02/2018 1350   LDLCALC 59 05/25/2017 0800   LDLDIRECT 142.4 03/21/2011 0843    Additional studies/ records that were reviewed today include:  Cardiac cath 11/18/2019  Ost Cx to Prox Cx lesion is 20% stenosed.  Mid RCA lesion is 10% stenosed.  The left ventricular systolic function is normal.  LV end diastolic pressure  is normal.  The left ventricular ejection fraction is 55-65% by visual estimate.  There is no mitral valve regurgitation.   1. Patent mid RCA stent with minimal restenosis 2. Mild non-obstructive disease in  the Circumflex 3. Normal LV systolic function   Recommendations: Continue medical management of CAD. Explore other causes of chest pain.        ASSESSMENT:    1. Coronary artery disease due to lipid rich plaque   2. Fall, sequela   3. Essential hypertension   4. Hyperlipidemia, unspecified hyperlipidemia type   5. Diabetes mellitus type 2 with complications ([place])   6. Tobacco abuse   7. Morbid obesity ([place])      PLAN:  In order of problems listed above:   CAD status post BMS x2 to the RCA in 2012, cardiac cath 2017 patent stents nonobstructive disease with normal LVEF, repeat cath 11/23/2019 patent mid RCA stent with minimal restenosis mild nonobstructive disease in the circumflex normal LV function.  Consider other causes of chest pain. No chest pressure since.  Continue current meds.  Fall yesterday after tripping and landing on her right chest-went to ER -nothing broken but very sore  Essential hypertension-BP stable  Hyperlipidemia LDL 63 09/02/2018 on Lipitor check lipid panel today.  DM type II hemoglobin A1c 7.2 09/02/2018  Tobacco abuse down to 1 cigarette daily.  Morbid obesity exercise and weight loss essential to overall health.   Medication Adjustments/Labs and Tests Ordered: Current medicines are reviewed at length with the patient today.  Concerns regarding medicines are outlined above.  Medication changes, Labs and Tests ordered today are listed in the Patient Instructions below. Patient Instructions  Medication Instructions:  Your physician recommends that you continue on your current medications as directed. Please refer to the Current Medication list given to you today.  *If you need a refill on your cardiac medications before your next appointment, please call your pharmacy*   Lab Work: TODAY: FLP  If you have labs (blood work) drawn today and your tests are completely normal, you will receive your results only by: Marland Kitchen MyChart Message (if you have  MyChart) OR . A paper copy in the mail If you have any lab test that is abnormal or we need to change your treatment, we will call you to review the results.   Follow-Up: At [facility], you and your health needs are our priority.  As part of our continuing mission to provide you with exceptional heart care, we have created designated Provider Care Teams.  These Care Teams include your primary Cardiologist (physician) and Advanced Practice Providers (APPs -  Physician Assistants and Nurse Practitioners) who all work together to provide you with the care you need, when you need it.  Your next appointment:   6 month(s)  The format for your next appointment:   In Person  Provider:   You may see Ena Dawley, MD or one of the following Advanced Practice Providers on your designated Care Team:    Melina Copa, PA-C  Ermalinda Barrios, PA-C        Signed, Ermalinda Barrios, PA-C  12/21/2019 12:08 PM    [address], [place], [place]  [id number] Phone: [phone]; Fax: [phone]

## 2019-12-20 NOTE — ED Notes (Signed)
Patient verbalizes understanding of discharge instructions. Opportunity for questioning and answers were provided. Armband removed by staff, pt discharged from ED.  

## 2019-12-20 NOTE — Discharge Instructions (Signed)
Please read and follow all provided instructions.  Your diagnoses today include:  1. Contusion of rib on right side, initial encounter   2. Pain   3. Multiple abrasions   4. Fall, initial encounter     Tests performed today include:  X-ray of your chest and leg - no definite broken bones  Vital signs. See below for your results today.   Medications prescribed:   Percocet (oxycodone/acetaminophen) - narcotic pain medication  DO NOT drive or perform any activities that require you to be awake and alert because this medicine can make you drowsy. BE VERY CAREFUL not to take multiple medicines containing Tylenol (also called acetaminophen). Doing so can lead to an overdose which can damage your liver and cause liver failure and possibly death.  Take any prescribed medications only as directed.  Home care instructions:  Follow any educational materials contained in this packet.  Please use your incentive spirometer 10 times every hour while awake.  This will help prevent pneumonia from not breathing deeply.  BE VERY CAREFUL not to take multiple medicines containing Tylenol (also called acetaminophen). Doing so can lead to an overdose which can damage your liver and cause liver failure and possibly death.   Follow-up instructions: Please follow-up with your primary care provider in the next 3 days for further evaluation of your symptoms.   Return instructions:   Please return to the Emergency Department if you experience worsening symptoms.   Return with uncontrolled pain, persistent vomiting, difficulty breathing, fever.  Please return if you have any other emergent concerns.  Additional Information:  Your vital signs today were: BP 124/65   Pulse 65   Temp 98.1 F (36.7 C) (Oral)   Resp 15   Ht 5' 3"  (1.6 m)   Wt 109.8 kg   SpO2 97%   BMI 42.87 kg/m  If your blood pressure (BP) was elevated above 135/85 this visit, please have this repeated by your doctor within one  month. --------------

## 2019-12-20 NOTE — ED Provider Notes (Signed)
[facility] EMERGENCY DEPARTMENT Provider Note   CSN: [id number] Arrival date & time: 12/20/19  1411     History Chief Complaint  Patient presents with  . Fall  . Chest Pain    Kendra Adams is a 59 y.o. female.  Patient with history of coronary artery disease on baby aspirin daily, diabetes --presents to the emergency department after a fall today.  Patient states that she was leaving her house and tripped over the door mat.  She fell forward falling down several stairs.  She states that she landed on her right side with her right arm and elbow tucked underneath her right ribs.  She sustained an abrasion to her right elbow and currently complains of pain in the right ribs.  She also has an abrasion to her right knee.  She was able to ambulate after the accident.  She did not hit her head or lose consciousness.  She denies current headache, neck pain, confusion, vomiting.  No shortness of breath however it is difficult for her to take a deep breath because of pain in her right side.  No abdominal pains.  Denies preceding chest pain, shortness of breath, or syncope.        Past Medical History:  Diagnosis Date  . Allergic rhinitis   . Asthma   . Atopic dermatitis   . CAD (coronary artery disease)    a. 12/13/10: mRCA 99%, EF 65-70%;  s/p BMS to mRCA  . COPD (chronic obstructive pulmonary disease) ([place])   . Depression   . DM2 (diabetes mellitus, type 2) ([place])   . GERD (gastroesophageal reflux disease)   . GI bleed   . HTN (hypertension)   . Hyperlipidemia   . Hyperlipidemia   . Hypothyroidism   . Psoriasis   . Seasonal allergies   . Tobacco abuse     Patient Active Problem List   Diagnosis Date Noted  . Chest pain of uncertain etiology   . Pyelonephritis 11/11/2019  . Postnasal drip 09/27/2019  . Ear pain, right 03/23/2019  . Need for vaccination against hepatitis B virus 12/02/2018  . Hypocalcemia 12/02/2018  . Olecranon bursitis of left elbow  12/02/2018  . Preoperative clearance 09/27/2018  . Crohn's disease ([place]) 09/22/2018  . Anemia 09/22/2018  . Aortic atherosclerosis ([place]) 09/12/2018  . Bilateral hip pain 05/27/2017  . Dyspareunia, female 04/06/2017  . Atopic dermatitis 01/01/2017  . History of restless legs syndrome 11/24/2016  . 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 Psoriasis 10/15/2015  . Elevated transaminase level 08/12/2015  . Tendinitis of left rotator cuff 01/19/2015  . Tobacco use disorder 09/07/2014  . Dysuria 01/07/2013  . Morbid obesity ([place]) 03/19/2011  . Cough 03/17/2011  . Routine health maintenance 02/03/2011  . CAD (coronary artery disease) 12/26/2010  . Diabetes mellitus type 2 with complications ([place]) 74/94/4967  . Hypertension associated with diabetes ([place]) 05/10/2008  . Hypothyroidism 04/27/2008  . HLD (hyperlipidemia) 04/27/2008  . Depression 04/27/2008  . GERD 04/27/2008    Past Surgical History:  Procedure Laterality Date  . BIOPSY  10/28/2018   Procedure: BIOPSY;  Surgeon: Juanita Craver, MD;  Location: [facility];  Service: Endoscopy;;  . CARDIAC CATHETERIZATION N/A 08/14/2015   Procedure: Left Heart Cath and Coronary Angiography;  Surgeon: Peter M Martinique, MD;  Location: [facility];  Service: Cardiovascular;  Laterality: N/A;  . CARPAL TUNNEL RELEASE     w/ bone spurs  . CHOLECYSTECTOMY N/A 08/15/2015  Procedure: LAPAROSCOPIC CHOLECYSTECTOMY WITH ATTEMPTED INTRAOPERATIVE CHOLANGIOGRAM;  Surgeon: Erroll Luna, MD;  Location: [facility];  Service: General;  Laterality: N/A;  . COLONOSCOPY WITH PROPOFOL N/A 10/28/2018   Procedure: COLONOSCOPY WITH PROPOFOL;  Surgeon: Juanita Craver, MD;  Location: [facility];  Service: Endoscopy;  Laterality: N/A;  . CORONARY ANGIOPLASTY WITH STENT PLACEMENT    . DILATION AND CURETTAGE OF UTERUS    . LEFT HEART CATH AND CORONARY ANGIOGRAPHY N/A 11/23/2019   Procedure: LEFT HEART CATH AND CORONARY ANGIOGRAPHY;  Surgeon: Burnell Blanks, MD;  Location: [facility];  Service: Cardiovascular;  Laterality: N/A;     OB History    Gravida  2   Para      Term      Preterm      AB  2   Living        SAB  1   TAB  1   Ectopic      Multiple      Live Births              Family History  Problem Relation Age of Onset  . Diabetes Mother   . Hypertension Mother     Social History   Tobacco Use  . Smoking status: Current Every Day Smoker    Packs/day: 0.30    Years: 40.00    Pack years: 12.00    Types: Cigarettes    Last attempt to quit: 05/19/2014    Years since quitting: 5.5  . Smokeless tobacco: Never Used  . Tobacco comment: 2 cigs/day.  Vaping Use  . Vaping Use: Some days  Substance Use Topics  . Alcohol use: No    Alcohol/week: 0.0 standard drinks  . Drug use: Yes    Frequency: 7.0 times per week    Types: Marijuana    Comment: Marijuana Use    Home Medications Prior to Admission medications   Medication Sig Start Date End Date Taking? Authorizing Provider  acetaminophen (TYLENOL) 325 MG tablet Take 2 tablets (650 mg total) by mouth every 6 (six) hours as needed. 11/10/19   Darr, Marguerita Beards, PA-C  albuterol (PROVENTIL HFA;VENTOLIN HFA) 108 (90 Base) MCG/ACT inhaler Inhale 1-2 puffs into the lungs every 6 (six) hours as needed for wheezing or shortness of breath. 01/01/18   [name], MD  aspirin 81 MG chewable tablet Chew 81 mg by mouth every morning.     [provider]  atorvastatin (LIPITOR) 80 MG tablet Take 1 tablet by mouth once daily Patient taking differently: Take 80 mg by mouth daily.  09/16/19   [name], MD  betamethasone dipropionate (DIPROLENE) 0.05 % cream Apply topically 2 (two) times daily. Patient taking differently: Apply 1 application topically 2 (two) times daily as needed (psoriasis).  02/15/18   [name], MD  buPROPion (WELLBUTRIN SR) 150 MG 12 hr tablet Take 1 tablet (150 mg total) by mouth 2 (two) times daily.  03/23/19   [name], MD  cetirizine (ZYRTEC ALLERGY) 10 MG tablet Take 1 tablet (10 mg total) by mouth daily. 09/26/19 09/25/20  [place], Nila Nephew D, DO  ferrous sulfate 325 (65 FE) MG EC tablet Take 1 tablet (325 mg total) by mouth daily with breakfast. Patient not taking: Reported on 11/21/2019 10/18/18 10/18/19  Jean Rosenthal, MD  fluticasone (FLONASE) 50 MCG/ACT nasal spray Place 2 sprays into both nostrils daily. Patient not taking: Reported on 11/21/2019 09/26/19   Modena Nunnery D, DO  glipiZIDE (GLUCOTROL) 5 MG  tablet Take 1 tablet (5 mg total) by mouth daily. 03/23/19   [name], MD  glucose blood (CONTOUR TEST) test strip Use as instructed 03/23/19   [name], MD  levothyroxine (SYNTHROID) 150 MCG tablet Take 1 tablet (150 mcg total) by mouth daily before breakfast. 03/23/19   [name], MD  loratadine (CLARITIN) 10 MG tablet Take 1 tablet (10 mg total) by mouth daily. Patient not taking: Reported on 11/21/2019 02/15/18   [name], MD  losartan (COZAAR) 100 MG tablet Take 1 tablet (100 mg total) by mouth at bedtime. 10/26/19   [name], MD  metFORMIN (GLUCOPHAGE) 1000 MG tablet Take 1 tablet by mouth twice daily Patient taking differently: Take 1,000 mg by mouth 2 (two) times daily with a meal.  10/17/19   [name], MD  metoprolol succinate (TOPROL-XL) 100 MG 24 hr tablet TAKE 1 TABLET BY MOUTH ONCE DAILY WITH  OR  IMMEDIATELY  FOLLOWING  A  MEAL 12/13/19   Dorothy Spark, MD  Microlet Lancets MISC Check CBG once daily 03/29/19   [name], MD  nitroGLYCERIN (NITROSTAT) 0.4 MG SL tablet Place 1 tablet (0.4 mg total) under the tongue every 5 (five) minutes as needed for chest pain. 10/26/19   [name], MD  omeprazole (PRILOSEC) 40 MG capsule Take 1 capsule by mouth once daily Patient taking differently: Take 40 mg by mouth daily.  10/28/19   [name], MD  ondansetron (ZOFRAN) 4 MG tablet Take 1 tablet (4 mg  total) by mouth every 8 (eight) hours as needed for nausea or vomiting. 11/10/19   Darr, Marguerita Beards, PA-C  rOPINIRole (REQUIP) 1 MG tablet TAKE 1 TABLET BY MOUTH AT BEDTIME Patient taking differently: Take 1 mg by mouth at bedtime.  10/28/19   [name], MD  sertraline (ZOLOFT) 100 MG tablet TAKE 1 & 1/2 (ONE & ONE-HALF) TABLETS BY MOUTH ONCE DAILY Patient taking differently: Take 150 mg by mouth daily.  09/16/19   [name], MD  Spacer/Aero-Holding Chambers (AEROCHAMBER PLUS) inhaler Use as instructed 02/03/15   Melynda Ripple, MD  triamcinolone cream (KENALOG) 0.1 % Apply 1 application topically 2 (two) times daily. Patient not taking: Reported on 11/21/2019 10/26/19   [name], MD    Allergies    Nsaids, Opium, Tramadol hcl, and Gabapentin  Review of Systems   Review of Systems  Constitutional: Negative for fatigue.  HENT: Negative for tinnitus.   Eyes: Negative for photophobia, pain and visual disturbance.  Respiratory: Negative for shortness of breath.   Cardiovascular: Positive for chest pain (Right ribs).  Gastrointestinal: Negative for nausea and vomiting.  Musculoskeletal: Negative for back pain, gait problem and neck pain.  Skin: Positive for wound (Multiple abrasions).  Neurological: Negative for dizziness, weakness, light-headedness, numbness and headaches.  Psychiatric/Behavioral: Negative for confusion and decreased concentration.    Physical Exam Updated Vital Signs BP 129/65 (BP Location: Left Arm)   Pulse 65   Temp 98.1 F (36.7 C) (Oral)   Resp 15   Ht 5' 3"  (1.6 m)   Wt 109.8 kg   SpO2 98%   BMI 42.87 kg/m   Physical Exam Vitals and nursing note reviewed.  Constitutional:      General: She is not in acute distress.    Appearance: She is well-developed.  HENT:     Head: Normocephalic and atraumatic.     Right Ear: External ear normal.     Left Ear: External ear normal.     Nose: Nose  normal.  Eyes:     Conjunctiva/sclera:  Conjunctivae normal.  Cardiovascular:     Rate and Rhythm: Normal rate and regular rhythm.     Heart sounds: No murmur heard.   Pulmonary:     Effort: No respiratory distress.     Breath sounds: No wheezing, rhonchi or rales.  Chest:     Chest wall: Tenderness present. No lacerations, deformity, swelling, crepitus or edema.       Comments: Patient has tenderness to palpation to the right anterior and lateral ribs beneath the breast and moving lateral to the breast.  No ecchymosis or skin signs of trauma. Abdominal:     Palpations: Abdomen is soft.     Tenderness: There is no abdominal tenderness. There is no guarding or rebound.  Musculoskeletal:     Right shoulder: Tenderness present. No bony tenderness. Normal range of motion.     Right upper arm: No tenderness or bony tenderness.     Right elbow: Normal range of motion. Tenderness present.     Right forearm: No tenderness.     Cervical back: Normal range of motion and neck supple. No tenderness or bony tenderness. Normal range of motion.     Thoracic back: No tenderness or bony tenderness.     Lumbar back: No tenderness or bony tenderness.     Right hip: No tenderness or bony tenderness. Normal range of motion.     Right knee: No bony tenderness. Tenderness present.     Right lower leg: No edema.     Left lower leg: No edema.     Comments: Small abrasion over the lateral elbow and anterior knee.  Skin:    General: Skin is warm and dry.     Findings: No rash.  Neurological:     General: No focal deficit present.     Mental Status: She is alert. Mental status is at baseline.     GCS: GCS eye subscore is 4. GCS verbal subscore is 5. GCS motor subscore is 6.     Cranial Nerves: Cranial nerves are intact.     Motor: No weakness.     Coordination: Coordination is intact.     Gait: Gait is intact.  Psychiatric:        Mood and Affect: Mood normal.     ED Results / Procedures / Treatments   Labs (all labs ordered are listed,  but only abnormal results are displayed) Labs Reviewed - No data to display  EKG None  Radiology DG Chest 2 View  Result Date: 12/20/2019 CLINICAL DATA:  Chest pain after fall. EXAM: CHEST - 2 VIEW COMPARISON:  November 10, 2019. FINDINGS: The heart size and mediastinal contours are within normal limits. Both lungs are clear. No pneumothorax or pleural effusion is noted. The visualized skeletal structures are unremarkable. IMPRESSION: No active cardiopulmonary disease. Electronically Signed   By: Marijo Conception M.D.   On: 12/20/2019 15:14   DG FEMUR, MIN 2 VIEWS RIGHT  Result Date: 12/20/2019 CLINICAL DATA:  Right femur pain after fall. EXAM: RIGHT FEMUR 2 VIEWS COMPARISON:  None. FINDINGS: There is no evidence of fracture or other focal bone lesions. Soft tissues are unremarkable. IMPRESSION: Negative. Electronically Signed   By: Marijo Conception M.D.   On: 12/20/2019 15:13    Procedures Procedures (including critical care time)  Medications Ordered in ED Medications  oxyCODONE-acetaminophen (PERCOCET/ROXICET) 5-325 MG per tablet 1 tablet (1 tablet Oral Given 12/20/19 1810)    ED  Course  I have reviewed the triage vital signs and the nursing notes.  Pertinent labs & imaging results that were available during my care of the patient were reviewed by me and considered in my medical decision making (see chart for details).  Patient seen and examined. Work-up reviewed with patient at bedside.  Pain medication ordered.  Will ensure she can ambulate without any difficulty.  Vital signs reviewed and are as follows: BP 118/62 (BP Location: Right Arm)   Pulse 68   Temp 98.1 F (36.7 C) (Oral)   Resp 18   Ht 5' 3"  (1.6 m)   Wt 109.8 kg   SpO2 98%   BMI 42.87 kg/m   7:03 PM patient is ready for discharge.  She has ambulated, received incentive spirometer.  I saw her prior to discharge.  She appears comfortable, sitting on the edge of the bed using her phone.  Discussed use of pain  medication, PCP follow-up to ensure that she is healing well.  Patient counseled on use of narcotic pain medications. Counseled not to combine these medications with others containing tylenol. Urged not to drink alcohol, drive, or perform any other activities that requires focus while taking these medications. The patient verbalizes understanding and agrees with the plan.    MDM Rules/Calculators/A&P                          Patient with mechanical fall today after tripping over a door mat.  She sustained injuries to her right arm, right leg, and right lateral chest wall.  Most of her pain seems to be in her right rib area.  X-ray negative without signs of pneumothorax or pulmonary injury.  X-ray of the right upper leg was normal.  Patient is ambulatory.  Pain is controlled and she appears well.  Vital signs are normal, ready for discharged home.  Do not suspect significant head or neck injury given her history and exam.  Neurologically she is intact.    Final Clinical Impression(s) / ED Diagnoses Final diagnoses:  Contusion of rib on right side, initial encounter  Multiple abrasions  Fall, initial encounter    Rx / DC Orders ED Discharge Orders         Ordered    oxyCODONE-acetaminophen (PERCOCET/ROXICET) 5-325 MG tablet  Every 6 hours PRN        12/20/19 1857           Carlisle Cater, PA-C 12/20/19 1904    Blanchie Dessert, MD 12/21/19 1353

## 2019-12-20 NOTE — ED Triage Notes (Signed)
To triage via EMS from home.  Pt tripped over rug on porch and fell face forward off 4 small steps onto ground.  Landed with right arm over right breast.  C/o pain on upper lateral side of breast and lower lateral side of breast; right lateral thigh.  Abrasion on right knee.  No LOC or head injury EMS BP 142/100 RR 18 HR 76 SpO2 95%  CBG 118

## 2019-12-21 ENCOUNTER — Encounter: Payer: Self-pay | Admitting: Physician Assistant

## 2019-12-21 ENCOUNTER — Other Ambulatory Visit: Payer: Self-pay

## 2019-12-21 ENCOUNTER — Ambulatory Visit (INDEPENDENT_AMBULATORY_CARE_PROVIDER_SITE_OTHER): Payer: Self-pay | Admitting: Physician Assistant

## 2019-12-21 VITALS — BP 146/84 | HR 76 | Ht 63.0 in | Wt 250.2 lb

## 2019-12-21 DIAGNOSIS — I2510 Atherosclerotic heart disease of native coronary artery without angina pectoris: Secondary | ICD-10-CM

## 2019-12-21 DIAGNOSIS — W19XXXS Unspecified fall, sequela: Secondary | ICD-10-CM

## 2019-12-21 DIAGNOSIS — I2583 Coronary atherosclerosis due to lipid rich plaque: Secondary | ICD-10-CM

## 2019-12-21 DIAGNOSIS — E785 Hyperlipidemia, unspecified: Secondary | ICD-10-CM

## 2019-12-21 DIAGNOSIS — Z720 Tobacco use: Secondary | ICD-10-CM

## 2019-12-21 DIAGNOSIS — E118 Type 2 diabetes mellitus with unspecified complications: Secondary | ICD-10-CM

## 2019-12-21 DIAGNOSIS — I10 Essential (primary) hypertension: Secondary | ICD-10-CM

## 2019-12-21 LAB — LIPID PANEL
Chol/HDL Ratio: 3.7 ratio (ref 0.0–4.4)
Cholesterol, Total: 137 mg/dL (ref 100–199)
HDL: 37 mg/dL — ABNORMAL LOW (ref >39)
LDL Chol Calc (NIH): 76 mg/dL (ref 0–99)
Triglycerides: 136 mg/dL (ref 0–149)
VLDL Cholesterol Cal: 24 mg/dL (ref 5–40)

## 2019-12-21 NOTE — Patient Instructions (Signed)
Medication Instructions:  Your physician recommends that you continue on your current medications as directed. Please refer to the Current Medication list given to you today.  *If you need a refill on your cardiac medications before your next appointment, please call your pharmacy*   Lab Work: TODAY: FLP  If you have labs (blood work) drawn today and your tests are completely normal, you will receive your results only by: Marland Kitchen MyChart Message (if you have MyChart) OR . A paper copy in the mail If you have any lab test that is abnormal or we need to change your treatment, we will call you to review the results.   Follow-Up: At [facility], you and your health needs are our priority.  As part of our continuing mission to provide you with exceptional heart care, we have created designated Provider Care Teams.  These Care Teams include your primary Cardiologist (physician) and Advanced Practice Providers (APPs -  Physician Assistants and Nurse Practitioners) who all work together to provide you with the care you need, when you need it.  Your next appointment:   6 month(s)  The format for your next appointment:   In Person  Provider:   You may see Ena Dawley, MD or one of the following Advanced Practice Providers on your designated Care Team:    Melina Copa, PA-C  Ermalinda Barrios, PA-C

## 2019-12-23 ENCOUNTER — Ambulatory Visit (INDEPENDENT_AMBULATORY_CARE_PROVIDER_SITE_OTHER): Payer: Self-pay | Admitting: Internal Medicine

## 2019-12-23 ENCOUNTER — Other Ambulatory Visit: Payer: Self-pay

## 2019-12-23 ENCOUNTER — Encounter: Payer: Self-pay | Admitting: Internal Medicine

## 2019-12-23 VITALS — BP 147/82 | HR 95 | Temp 98.6°F | Ht 63.0 in | Wt 247.2 lb

## 2019-12-23 DIAGNOSIS — S20211D Contusion of right front wall of thorax, subsequent encounter: Secondary | ICD-10-CM

## 2019-12-23 MED ORDER — OXYCODONE-ACETAMINOPHEN 5-325 MG PO TABS
1.0000 | ORAL_TABLET | Freq: Four times a day (QID) | ORAL | 0 refills | Status: DC | PRN
Start: 2019-12-23 — End: 2019-12-29

## 2019-12-23 NOTE — Patient Instructions (Signed)
Ms. Puerta, So sorry to hear about your fall. You can continue using a pillow when able to help brace when you cough. Take Mucinex DM to help bring up the mucous and suppress your cough. Continue taking the nasal spray and allergy pill to minimize your post nasal drip.   I have also extended out your pain medication to take as needed.   Take care, Dr. Koleen Distance

## 2019-12-24 ENCOUNTER — Encounter: Payer: Self-pay | Admitting: Internal Medicine

## 2019-12-24 DIAGNOSIS — S20211D Contusion of right front wall of thorax, subsequent encounter: Secondary | ICD-10-CM | POA: Insufficient documentation

## 2019-12-24 NOTE — Assessment & Plan Note (Signed)
Patient presents for ED follow-up after sustaining a fall off her porch steps onto her right side. Imaging was negative for any fractures, but she has quite a bit of pain along her chest wall. She is taking prescribed percocet as needed, as well as using incentive spirometer. She has a history of chronic post-nasal drip and productive cough for which she takes flonase and an antihistamine. Due to pain, she is having a more difficult time coughing mucous up. Instructed her to take Mucinex DM and use a pillow to help brace when she needs to cough. Will also prescribe her Percocet to take every 6 hours prn for another week while she continues to heal from this fall.

## 2019-12-24 NOTE — Progress Notes (Signed)
Acute Office Visit  Subjective:    Patient ID: Kendra Adams, female    DOB: 05-27-1960, 59 y.o.   MRN: [id number]  Chief Complaint  Patient presents with  . Follow-up    Fall    HPI Patient is in today for Patient is in today for ED follow-up visit after falling off her porch steps onto her right side 3 days ago. Fortunately, all of her x-rays were negative for acute fractures. Most painful area is right side of her rib cage. She was sent home with narcotic pain medication to take as needed, as well as an incentive spirometer. She is still in quite a bit of pain, particularly when she has to cough or tries to breathe deeply. She suffers from chronic post-nasal drip which results in productive cough. She has been taking her usual flonase and antihistamine and has successfully been using the incentive spirometer several times per day. She takes the Percocet only when absolutely necessary, but does feel she could benefit from a few more days' worth of pills.  Please see problem based charting for assessment and plan.    Past Medical History:  Diagnosis Date  . Allergic rhinitis   . Asthma   . Atopic dermatitis   . CAD (coronary artery disease)    a. 12/13/10: mRCA 99%, EF 65-70%;  s/p BMS to mRCA  . COPD (chronic obstructive pulmonary disease) ([place])   . Depression   . DM2 (diabetes mellitus, type 2) ([place])   . GERD (gastroesophageal reflux disease)   . GI bleed   . HTN (hypertension)   . Hyperlipidemia   . Hyperlipidemia   . Hypothyroidism   . Psoriasis   . Seasonal allergies   . Tobacco abuse     Past Surgical History:  Procedure Laterality Date  . BIOPSY  10/28/2018   Procedure: BIOPSY;  Surgeon: Juanita Craver, MD;  Location: [facility];  Service: Endoscopy;;  . CARDIAC CATHETERIZATION N/A 08/14/2015   Procedure: Left Heart Cath and Coronary Angiography;  Surgeon: Peter M Martinique, MD;  Location: [facility];  Service: Cardiovascular;  Laterality: N/A;  . CARPAL  TUNNEL RELEASE     w/ bone spurs  . CHOLECYSTECTOMY N/A 08/15/2015   Procedure: LAPAROSCOPIC CHOLECYSTECTOMY WITH ATTEMPTED INTRAOPERATIVE CHOLANGIOGRAM;  Surgeon: Erroll Luna, MD;  Location: [facility];  Service: General;  Laterality: N/A;  . COLONOSCOPY WITH PROPOFOL N/A 10/28/2018   Procedure: COLONOSCOPY WITH PROPOFOL;  Surgeon: Juanita Craver, MD;  Location: [facility];  Service: Endoscopy;  Laterality: N/A;  . CORONARY ANGIOPLASTY WITH STENT PLACEMENT    . DILATION AND CURETTAGE OF UTERUS    . LEFT HEART CATH AND CORONARY ANGIOGRAPHY N/A 11/23/2019   Procedure: LEFT HEART CATH AND CORONARY ANGIOGRAPHY;  Surgeon: Burnell Blanks, MD;  Location: [facility];  Service: Cardiovascular;  Laterality: N/A;    Family History  Problem Relation Age of Onset  . Diabetes Mother   . Hypertension Mother     Social History   Socioeconomic History  . Marital status: Married    Spouse name: Not on file  . Number of children: 0  . Years of education: 10th grade  . Highest education level: Not on file  Occupational History  . Occupation: Conservation officer, [employer]    Comment: self-employed  . 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 Smoking status: Current Every Day Smoker    Packs/day: 0.30    Years: 40.00  Pack years: 12.00    Types: Cigarettes    Last attempt to quit: 05/19/2014    Years since quitting: 5.6  . Smokeless tobacco: Never Used  . Tobacco comment: 2 cigs/day.  Vaping Use  . Vaping Use: Some days  Substance and Sexual Activity  . Alcohol use: No    Alcohol/week: 0.0 standard drinks  . Drug use: Yes    Frequency: 7.0 times per week    Types: Marijuana    Comment: Marijuana Use  . Sexual activity: Not on file  Other Topics Concern  . Not on file  Social History Narrative   Father died of suicide (gunshot) in 60. 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        Patient lives with her husband, her husband's niece Tammy who is mentally retarded and her best friend and the friend's daughter and 6 dogs               Social Determinants of Health   Financial Resource Strain:   . Difficulty of Paying Living Expenses: Not on file  Food Insecurity:   . Worried About Charity fundraiser in the Last Year: Not on file  . Ran Out of Food in the Last Year: Not on file  Transportation Needs:   . Lack of Transportation (Medical): Not on file  . Lack of Transportation (Non-Medical): Not on file  Physical Activity:   . Days of Exercise per Week: Not on file  . Minutes of Exercise per Session: Not on file  Stress:   . Feeling of Stress : Not on file  Social Connections:   . Frequency of Communication with Friends and Family: Not on file  . Frequency of Social Gatherings with Friends and Family: Not on file  . Attends Religious Services: Not on file  . Active Member of Clubs or Organizations: Not on file  . Attends Archivist Meetings: Not on file  . Marital Status: Not on file  Intimate Partner Violence:   . Fear of Current or Ex-Partner: Not on file  . Emotionally Abused: Not on file  . Physically Abused: Not on file  . Sexually Abused: Not on file    Outpatient Medications Prior to Visit  Medication Sig Dispense Refill  . albuterol (PROVENTIL HFA;VENTOLIN HFA) 108 (90 Base) MCG/ACT inhaler Inhale 1-2 puffs into the lungs every 6 (six) hours as needed for wheezing or shortness of breath. 1 Inhaler 0  . aspirin 81 MG chewable tablet Chew 81 mg by mouth every morning.     Marland Kitchen atorvastatin (LIPITOR) 80 MG tablet Take 1 tablet by mouth once daily (Patient taking differently: Take 80 mg by mouth daily. ) 90 tablet 1  . betamethasone dipropionate (DIPROLENE) 0.05 % cream Apply topically 2 (two) times daily. (Patient taking differently: Apply 1 application topically 2 (two) times daily as needed (psoriasis). ) 45 g 2  . buPROPion (WELLBUTRIN  SR) 150 MG 12 hr tablet Take 1 tablet (150 mg total) by mouth 2 (two) times daily. 60 tablet 11  . cetirizine (ZYRTEC ALLERGY) 10 MG tablet Take 1 tablet (10 mg total) by mouth daily. 30 tablet 2  . ferrous sulfate 325 (65 FE) MG EC tablet Take 1 tablet (325 mg total) by mouth daily with breakfast. 30 tablet 11  . fluticasone (FLONASE) 50 MCG/ACT nasal spray Place 2 sprays into both  nostrils daily. 16 g 2  . glipiZIDE (GLUCOTROL) 5 MG tablet Take 1 tablet (5 mg total) by mouth daily. 90 tablet 1  . glucose blood (CONTOUR TEST) test strip Use as instructed 100 each 12  . levothyroxine (SYNTHROID) 150 MCG tablet Take 1 tablet (150 mcg total) by mouth daily before breakfast. 90 tablet 3  . loratadine (CLARITIN) 10 MG tablet Take 1 tablet (10 mg total) by mouth daily. 30 tablet 2  . losartan (COZAAR) 100 MG tablet Take 1 tablet (100 mg total) by mouth at bedtime. 90 tablet 1  . metFORMIN (GLUCOPHAGE) 1000 MG tablet Take 1 tablet by mouth twice daily (Patient taking differently: Take 1,000 mg by mouth 2 (two) times daily with a meal. ) 180 tablet 0  . metoprolol succinate (TOPROL-XL) 100 MG 24 hr tablet TAKE 1 TABLET BY MOUTH ONCE DAILY WITH  OR  IMMEDIATELY  FOLLOWING  A  MEAL 90 tablet 3  . Microlet Lancets MISC Check CBG once daily 100 each 1  . nitroGLYCERIN (NITROSTAT) 0.4 MG SL tablet Place 1 tablet (0.4 mg total) under the tongue every 5 (five) minutes as needed for chest pain. 25 tablet 3  . omeprazole (PRILOSEC) 40 MG capsule Take 1 capsule by mouth once daily (Patient taking differently: Take 40 mg by mouth daily. ) 90 capsule 0  . ondansetron (ZOFRAN) 4 MG tablet Take 1 tablet (4 mg total) by mouth every 8 (eight) hours as needed for nausea or vomiting. 4 tablet 0  . rOPINIRole (REQUIP) 1 MG tablet TAKE 1 TABLET BY MOUTH AT BEDTIME (Patient taking differently: Take 1 mg by mouth at bedtime. ) 90 tablet 2  . sertraline (ZOLOFT) 100 MG tablet TAKE 1 & 1/2 (ONE & ONE-HALF) TABLETS BY MOUTH ONCE  DAILY (Patient taking differently: Take 150 mg by mouth daily. ) 45 tablet 5  . Spacer/Aero-Holding Chambers (AEROCHAMBER PLUS) inhaler Use as instructed 1 each 2  . triamcinolone cream (KENALOG) 0.1 % Apply 1 application topically 2 (two) times daily. 453.6 g 1  . acetaminophen (TYLENOL) 325 MG tablet Take 2 tablets (650 mg total) by mouth every 6 (six) hours as needed. 30 tablet 0  . oxyCODONE-acetaminophen (PERCOCET/ROXICET) 5-325 MG tablet Take 1 tablet by mouth every 6 (six) hours as needed for severe pain. 10 tablet 0   No facility-administered medications prior to visit.    Allergies  Allergen Reactions  . Nsaids     IBU - Rectal bleeds; states ASA & Tylenol OK  . Opium Nausea And Vomiting  . Tramadol Hcl     "Just doesn't do anything for me"  . Gabapentin Swelling and Rash    Skin rash, lip swelling    Review of Systems  Constitutional: Negative for chills, fatigue and fever.  HENT: Positive for postnasal drip. Negative for ear pain, sinus pain and sore throat.   Respiratory: Negative for chest tightness and shortness of breath.   Cardiovascular: Negative for chest pain.  Gastrointestinal: Negative for constipation and diarrhea.  Genitourinary: Negative for difficulty urinating.  Neurological: Negative for dizziness and headaches.       Objective:    Physical Exam Constitutional:      Appearance: Normal appearance.     Comments: In obvious pain when she coughs.   Cardiovascular:     Rate and Rhythm: Normal rate and regular rhythm.  Pulmonary:     Effort: Pulmonary effort is normal.     Breath sounds: Normal breath sounds.  Chest:  Comments: Tenderness along right chest wall, most prominently under the breast.  Neurological:     Mental Status: She is alert.     BP (!) 147/82 (BP Location: Left Arm, Patient Position: Sitting, Cuff Size: Normal)   Pulse 95   Temp 98.6 F (37 C) (Oral)   Ht 5' 3"  (1.6 m)   Wt 247 lb 3.2 oz (112.1 kg)   SpO2 94%   BMI  43.79 kg/m  Wt Readings from Last 3 Encounters:  12/23/19 247 lb 3.2 oz (112.1 kg)  12/21/19 250 lb 3.2 oz (113.5 kg)  12/20/19 242 lb (109.8 kg)    Health Maintenance Due  Topic Date Due  . OPHTHALMOLOGY EXAM  02/11/2015  . MAMMOGRAM  04/17/2019  . INFLUENZA VACCINE  10/09/2019    There are no preventive care reminders to display for this patient.   Lab Results  Component Value Date   TSH 10.100 (H) 10/26/2019   Lab Results  Component Value Date   WBC 10.8 (H) 11/23/2019   HGB 13.3 11/23/2019   HCT 41.2 11/23/2019   MCV 93.8 11/23/2019   PLT 270 11/23/2019   Lab Results  Component Value Date   NA 135 11/10/2019   K 3.8 11/10/2019   CO2 24 11/10/2019   GLUCOSE 102 (H) 11/10/2019   BUN 12 11/10/2019   CREATININE 1.08 (H) 11/10/2019   BILITOT 0.4 09/11/2018   ALKPHOS 105 09/11/2018   AST 19 09/11/2018   ALT 16 09/11/2018   PROT 6.2 (L) 09/11/2018   ALBUMIN 2.6 (L) 09/11/2018   CALCIUM 9.5 11/10/2019   ANIONGAP 11 11/10/2019   GFR 105.35 05/15/2011   Lab Results  Component Value Date   CHOL 137 12/21/2019   Lab Results  Component Value Date   HDL 37 (L) 12/21/2019   Lab Results  Component Value Date   LDLCALC 76 12/21/2019   Lab Results  Component Value Date   TRIG 136 12/21/2019   Lab Results  Component Value Date   CHOLHDL 3.7 12/21/2019   Lab Results  Component Value Date   HGBA1C 6.8 (A) 10/26/2019       Assessment & Plan:   Problem List Items Addressed This Visit    None    Visit Diagnoses    Chest wall contusion, right, subsequent encounter    -  Primary       Meds ordered this encounter  Medications  . oxyCODONE-acetaminophen (PERCOCET/ROXICET) 5-325 MG tablet    Sig: Take 1 tablet by mouth every 6 (six) hours as needed for severe pain.    Dispense:  30 tablet    Refill:  0     Gelila Well D Owen Pagnotta, DO

## 2019-12-28 NOTE — Progress Notes (Signed)
Internal Medicine Clinic Attending  Case discussed with Dr. Bloomfield  At the time of the visit.  We reviewed the resident's history and exam and pertinent patient test results.  I agree with the assessment, diagnosis, and plan of care documented in the resident's note.  

## 2019-12-29 ENCOUNTER — Telehealth: Payer: Self-pay

## 2019-12-29 ENCOUNTER — Telehealth: Payer: Self-pay | Admitting: *Deleted

## 2019-12-29 ENCOUNTER — Encounter: Payer: Self-pay | Admitting: *Deleted

## 2019-12-29 MED ORDER — OXYCODONE-ACETAMINOPHEN 5-325 MG PO TABS
1.0000 | ORAL_TABLET | Freq: Two times a day (BID) | ORAL | 0 refills | Status: DC | PRN
Start: 2019-12-29 — End: 2020-05-23

## 2019-12-29 NOTE — Telephone Encounter (Signed)
Notified patient via MyChart that Rx has been refilled for 10 tabs. Hubbard Hartshorn, BSN, RN-BC

## 2019-12-29 NOTE — Telephone Encounter (Signed)
Pt is requesting a nurse to callback [phone]

## 2019-12-29 NOTE — Telephone Encounter (Signed)
Acute pain generator from fall with contusion to the chest. She is having persistent pain, I will approve a second five day course of oxycodone twice daily. Hopefully the pain will continue to improve with time.

## 2019-12-29 NOTE — Telephone Encounter (Signed)
I am aware of the rules for the pain medication's again call me or text me by my chart please let me know thank you   Chanda Busing Devane "[name]"  Powers, Cooper Render, RN 5 minutes ago (11:19 AM)   Sent by MyChart:  Good morning nurse Kendra Adams hi I am requesting a refill on the Ox[place] codon because I still have a lot of pain in it where I hurt myself a week and a half ago. Dr. Koleen Distance prescribed me 30 oxycodone and hi took the prescription to [facility] they were only able to give me 47 of the oxycodone because of rules and regulations I do understand why of the epidemic of opiates I lost the 10 because of the rules and regulations I would just like to get the other 10 again the weekend is coming up and I don't think what I have is going to last till Monday or Tuesday or Wednesday of next week may I please be  re-prescribed the other 10 I don't think I will need any more after this one because I don't take them every six hours I try very hard to wait 8 to 10 hours to take it because I am not a drug user  again please reply thank you

## 2020-01-06 ENCOUNTER — Other Ambulatory Visit: Payer: Self-pay | Admitting: Internal Medicine

## 2020-01-21 ENCOUNTER — Other Ambulatory Visit: Payer: Self-pay | Admitting: Internal Medicine

## 2020-01-21 DIAGNOSIS — K219 Gastro-esophageal reflux disease without esophagitis: Secondary | ICD-10-CM

## 2020-01-21 DIAGNOSIS — E118 Type 2 diabetes mellitus with unspecified complications: Secondary | ICD-10-CM

## 2020-01-25 ENCOUNTER — Encounter: Payer: Self-pay | Admitting: Internal Medicine

## 2020-01-25 DIAGNOSIS — B373 Candidiasis of vulva and vagina: Secondary | ICD-10-CM

## 2020-01-25 DIAGNOSIS — B3731 Acute candidiasis of vulva and vagina: Secondary | ICD-10-CM

## 2020-01-25 MED ORDER — FLUCONAZOLE 150 MG PO TABS: ORAL_TABLET | ORAL | 0 refills | Status: DC

## 2020-01-31 ENCOUNTER — Other Ambulatory Visit: Payer: Self-pay | Admitting: Internal Medicine

## 2020-01-31 ENCOUNTER — Other Ambulatory Visit: Payer: Self-pay | Admitting: Student in an Organized Health Care Education/Training Program

## 2020-01-31 DIAGNOSIS — K219 Gastro-esophageal reflux disease without esophagitis: Secondary | ICD-10-CM

## 2020-01-31 DIAGNOSIS — E118 Type 2 diabetes mellitus with unspecified complications: Secondary | ICD-10-CM

## 2020-02-27 ENCOUNTER — Encounter: Payer: Self-pay | Admitting: *Deleted

## 2020-03-05 ENCOUNTER — Other Ambulatory Visit: Payer: Self-pay | Admitting: Internal Medicine

## 2020-03-05 DIAGNOSIS — F17200 Nicotine dependence, unspecified, uncomplicated: Secondary | ICD-10-CM

## 2020-03-12 NOTE — Telephone Encounter (Signed)
Approved wellbutrin refill request. Patient needs a follow up appointment with me, next available. Thanks!

## 2020-03-13 LAB — HM DIABETES EYE EXAM

## 2020-03-14 ENCOUNTER — Encounter: Payer: Self-pay | Admitting: Internal Medicine

## 2020-03-14 NOTE — Telephone Encounter (Signed)
Currently unable to schedule patient a future appointment.  No schedule is in the system.  Mailed patient a letter asking to call the clinic around the first week in February to make an appointment.

## 2020-03-17 ENCOUNTER — Other Ambulatory Visit: Payer: Self-pay | Admitting: Internal Medicine

## 2020-03-17 DIAGNOSIS — E1159 Type 2 diabetes mellitus with other circulatory complications: Secondary | ICD-10-CM

## 2020-03-30 ENCOUNTER — Other Ambulatory Visit: Payer: Self-pay | Admitting: Internal Medicine

## 2020-03-30 DIAGNOSIS — I10 Essential (primary) hypertension: Secondary | ICD-10-CM

## 2020-03-30 NOTE — Telephone Encounter (Signed)
Approved atorvastatin refill request. Please have patient schedule follow up. Thanks!

## 2020-04-02 ENCOUNTER — Telehealth: Payer: Self-pay | Admitting: *Deleted

## 2020-04-02 ENCOUNTER — Other Ambulatory Visit: Payer: Self-pay | Admitting: Internal Medicine

## 2020-04-02 DIAGNOSIS — F17200 Nicotine dependence, unspecified, uncomplicated: Secondary | ICD-10-CM

## 2020-04-02 DIAGNOSIS — E118 Type 2 diabetes mellitus with unspecified complications: Secondary | ICD-10-CM

## 2020-04-02 DIAGNOSIS — I10 Essential (primary) hypertension: Secondary | ICD-10-CM

## 2020-04-02 DIAGNOSIS — I152 Hypertension secondary to endocrine disorders: Secondary | ICD-10-CM

## 2020-04-02 DIAGNOSIS — K219 Gastro-esophageal reflux disease without esophagitis: Secondary | ICD-10-CM

## 2020-04-02 NOTE — Telephone Encounter (Signed)
Patient called in stating she has had covid symptoms x 7 days; Took at home test yesterday which was positive. C/o chest congestion, sinus drainage, and SHOB with activity and sometimes at rest. Denies fever. Continues to take bupropion to prevent smoking. Received second covid vaccine in April 2021. Has not yet had Booster. States she has a hx of respiratory infections and has been using an incentive spirometer throughout the day. First available appt for tele visit given for tomorrow at 0915 with [facility]. Will also send to PCP to advise. Hubbard Hartshorn, BSN, RN-BC

## 2020-04-02 NOTE — Telephone Encounter (Signed)
Spoke with PCP. Call placed to patient to call EMS to check VS to ensure she is not hypoxic. Left detailed message on patient's self-identified VM with this information and requested return call. Hubbard Hartshorn, BSN, RN-BC

## 2020-04-03 ENCOUNTER — Encounter: Payer: Self-pay | Admitting: Internal Medicine

## 2020-04-03 ENCOUNTER — Other Ambulatory Visit: Payer: Self-pay

## 2020-04-03 ENCOUNTER — Ambulatory Visit (INDEPENDENT_AMBULATORY_CARE_PROVIDER_SITE_OTHER): Payer: Self-pay | Admitting: Internal Medicine

## 2020-04-03 DIAGNOSIS — U071 COVID-19: Secondary | ICD-10-CM

## 2020-04-03 MED ORDER — CETIRIZINE HCL 10 MG PO TABS
10.0000 mg | ORAL_TABLET | Freq: Every day | ORAL | 2 refills | Status: DC
Start: 2020-04-03 — End: 2020-07-23

## 2020-04-03 MED ORDER — ATORVASTATIN CALCIUM 80 MG PO TABS: 80.0000 mg | ORAL_TABLET | Freq: Every day | ORAL | 0 refills | Status: DC

## 2020-04-03 MED ORDER — METFORMIN HCL 1000 MG PO TABS: 1000.0000 mg | ORAL_TABLET | Freq: Two times a day (BID) | ORAL | 0 refills | Status: DC

## 2020-04-03 MED ORDER — LOSARTAN POTASSIUM 100 MG PO TABS: 100.0000 mg | ORAL_TABLET | Freq: Every day | ORAL | 0 refills | Status: DC

## 2020-04-03 MED ORDER — BUPROPION HCL ER (SR) 150 MG PO TB12: 150.0000 mg | ORAL_TABLET | Freq: Two times a day (BID) | ORAL | 0 refills | Status: DC

## 2020-04-03 MED ORDER — OMEPRAZOLE 40 MG PO CPDR: 40.0000 mg | DELAYED_RELEASE_CAPSULE | Freq: Every day | ORAL | 0 refills | Status: DC

## 2020-04-03 MED ORDER — GLIPIZIDE 5 MG PO TABS: 5.0000 mg | ORAL_TABLET | Freq: Every day | ORAL | 0 refills | Status: DC

## 2020-04-03 MED ORDER — SERTRALINE HCL 100 MG PO TABS
150.0000 mg | ORAL_TABLET | Freq: Every day | ORAL | 2 refills | Status: DC
Start: 2020-04-03 — End: 2020-07-23

## 2020-04-03 NOTE — Telephone Encounter (Signed)
Next appt scheduled 05/09/20 with PCP.

## 2020-04-04 ENCOUNTER — Other Ambulatory Visit: Payer: Self-pay | Admitting: Internal Medicine

## 2020-04-04 DIAGNOSIS — Z8669 Personal history of other diseases of the nervous system and sense organs: Secondary | ICD-10-CM

## 2020-04-05 ENCOUNTER — Encounter: Payer: Self-pay | Admitting: Internal Medicine

## 2020-04-05 DIAGNOSIS — U071 COVID-19: Secondary | ICD-10-CM | POA: Insufficient documentation

## 2020-04-05 NOTE — Assessment & Plan Note (Signed)
Ms. Pomplun calls in with 1 week history of URI symptoms. She took a home COVID test 2 days prior which was positive. Primary symptoms are nasal congestion, productive cough, and shortness of breath with exertion. She has been taking Mucinex and Robitussin DM which have provided some relief. Her husband is sick as well.  She is fully vaccinated, but has not gotten booster yet.   A/P -Ms. Runyan is able speak in long strings of sentences without getting short of breath so her respiratory status is reassuring -I have asked her to get a pulse ox to monitor O2 sats while continuing to recover at home; she knows to seek care if they sustain below 88% -given her timeline, would not benefit from antiviral or monoclonal antibody therapy

## 2020-04-05 NOTE — Progress Notes (Signed)
  [facility] Telephone Encounter Continuity Care Appointment  HPI:   This telephone encounter was created for Ms. Anise Harbin on 04/05/2020 for the following purpose/cc URI symptoms in the setting of testing COVID + 2 days ago.   Past Medical History:  Past Medical History:  Diagnosis Date  . Allergic rhinitis   . Asthma   . Atopic dermatitis   . CAD (coronary artery disease)    a. 12/13/10: mRCA 99%, EF 65-70%;  s/p BMS to mRCA  . COPD (chronic obstructive pulmonary disease) ([place])   . Depression   . DM2 (diabetes mellitus, type 2) ([place])   . GERD (gastroesophageal reflux disease)   . GI bleed   . HTN (hypertension)   . Hyperlipidemia   . Hyperlipidemia   . Hypothyroidism   . Psoriasis   . Seasonal allergies   . Tobacco abuse       ROS:   Denies fevers, chills, chest pain, n/v, diarrhea    Assessment / Plan / Recommendations:   Please see A&P under problem oriented charting for assessment of the patient's acute and chronic medical conditions.   As always, pt is advised that if symptoms worsen or new symptoms arise, they should go to an urgent care facility or to to ER for further evaluation.   Consent and Medical Decision Making:   Patient discussed with Dr. Philipp Ovens  This is a telephone encounter between [name] and Delice Bison on 04/05/2020 for COVID-19 infection. The visit was conducted with the patient located at home and Delice Bison at [facility]. The patient's identity was confirmed using their DOB and current address. The patient has consented to being evaluated through a telephone encounter and understands the associated risks (an examination cannot be done and the patient may need to come in for an appointment) / benefits (allows the patient to remain at home, decreasing exposure to coronavirus). I personally spent 20 minutes on medical discussion.

## 2020-04-06 ENCOUNTER — Encounter: Payer: Self-pay | Admitting: Internal Medicine

## 2020-04-06 ENCOUNTER — Telehealth: Payer: Self-pay

## 2020-04-06 NOTE — Telephone Encounter (Signed)
Received following message from patient via mychart.  Discussed with Friday o/c MD, Dr. Koleen Distance who suggested patient continue with Robitussin DM and Mucinex (which was discussed during telehealth visit with Dr. Koleen Distance).  Patient called and notified of Dr. Michiel Sites recommendation and she verbalized understanding.  Pt very appreciative, no distress noted during conversation. SChaplin, RN,BSN        Kendra Adams, Kendra Devane "[name]"  [name], MD 43 minutes ago (3:02 PM)      My sinuses are still draining pretty regular even though I use the nose spray to help it dry up. What is coming out of my chest is a dark color chunky and think I need some help getting it out is there a cough syrup besides the Robitussin DM that can help it come out more can you prescribe that for me

## 2020-04-09 NOTE — Progress Notes (Signed)
Internal Medicine Clinic Attending  Case discussed with Dr. Bloomfield  At the time of the visit.  We reviewed the resident's history and exam and pertinent patient test results.  I agree with the assessment, diagnosis, and plan of care documented in the resident's note.  

## 2020-04-23 ENCOUNTER — Encounter: Payer: Self-pay | Admitting: *Deleted

## 2020-05-09 ENCOUNTER — Encounter: Payer: Self-pay | Admitting: Internal Medicine

## 2020-05-11 ENCOUNTER — Other Ambulatory Visit: Payer: Self-pay | Admitting: Internal Medicine

## 2020-05-11 DIAGNOSIS — E118 Type 2 diabetes mellitus with unspecified complications: Secondary | ICD-10-CM

## 2020-05-11 DIAGNOSIS — E038 Other specified hypothyroidism: Secondary | ICD-10-CM

## 2020-05-11 NOTE — Telephone Encounter (Signed)
Next appt scheduled 05/23/20 with PCP.

## 2020-05-23 ENCOUNTER — Ambulatory Visit (INDEPENDENT_AMBULATORY_CARE_PROVIDER_SITE_OTHER): Payer: Self-pay | Admitting: Internal Medicine

## 2020-05-23 ENCOUNTER — Other Ambulatory Visit: Payer: Self-pay | Admitting: Internal Medicine

## 2020-05-23 ENCOUNTER — Other Ambulatory Visit: Payer: Self-pay

## 2020-05-23 ENCOUNTER — Ambulatory Visit (HOSPITAL_COMMUNITY)
Admission: RE | Admit: 2020-05-23 | Discharge: 2020-05-23 | Disposition: A | Discharge status: Home / Self Care | Payer: Self-pay | Source: Ambulatory Visit | Attending: Internal Medicine | Admitting: Internal Medicine

## 2020-05-23 ENCOUNTER — Encounter: Payer: Self-pay | Admitting: Internal Medicine

## 2020-05-23 VITALS — BP 131/74 | HR 80 | Temp 98.4°F | Ht 63.0 in | Wt 257.8 lb

## 2020-05-23 DIAGNOSIS — K51014 Ulcerative (chronic) pancolitis with abscess: Secondary | ICD-10-CM

## 2020-05-23 DIAGNOSIS — K50919 Crohn's disease, unspecified, with unspecified complications: Secondary | ICD-10-CM

## 2020-05-23 DIAGNOSIS — E118 Type 2 diabetes mellitus with unspecified complications: Secondary | ICD-10-CM

## 2020-05-23 DIAGNOSIS — F17210 Nicotine dependence, cigarettes, uncomplicated: Secondary | ICD-10-CM

## 2020-05-23 DIAGNOSIS — E1159 Type 2 diabetes mellitus with other circulatory complications: Secondary | ICD-10-CM

## 2020-05-23 DIAGNOSIS — E785 Hyperlipidemia, unspecified: Secondary | ICD-10-CM

## 2020-05-23 DIAGNOSIS — K50114 Crohn's disease of large intestine with abscess: Secondary | ICD-10-CM

## 2020-05-23 DIAGNOSIS — I10 Essential (primary) hypertension: Secondary | ICD-10-CM

## 2020-05-23 DIAGNOSIS — I152 Hypertension secondary to endocrine disorders: Secondary | ICD-10-CM

## 2020-05-23 DIAGNOSIS — M25551 Pain in right hip: Secondary | ICD-10-CM

## 2020-05-23 DIAGNOSIS — L409 Psoriasis, unspecified: Secondary | ICD-10-CM

## 2020-05-23 DIAGNOSIS — K219 Gastro-esophageal reflux disease without esophagitis: Secondary | ICD-10-CM

## 2020-05-23 DIAGNOSIS — M25559 Pain in unspecified hip: Secondary | ICD-10-CM | POA: Insufficient documentation

## 2020-05-23 DIAGNOSIS — E038 Other specified hypothyroidism: Secondary | ICD-10-CM

## 2020-05-23 DIAGNOSIS — F17200 Nicotine dependence, unspecified, uncomplicated: Secondary | ICD-10-CM

## 2020-05-23 LAB — POCT GLYCOSYLATED HEMOGLOBIN (HGB A1C): Hemoglobin A1C: 6.9 % — AB (ref 4.0–5.6)

## 2020-05-23 LAB — GLUCOSE, CAPILLARY: Glucose-Capillary: 149 mg/dL — ABNORMAL HIGH (ref 70–99)

## 2020-05-23 MED ORDER — BUPROPION HCL ER (SR) 150 MG PO TB12: 150.0000 mg | ORAL_TABLET | Freq: Two times a day (BID) | ORAL | 0 refills | Status: DC

## 2020-05-23 MED ORDER — PREDNISONE 10 MG PO TABS: ORAL_TABLET | ORAL | 0 refills | Status: DC

## 2020-05-23 MED ORDER — LEVOTHYROXINE SODIUM 150 MCG PO TABS: 150.0000 ug | ORAL_TABLET | Freq: Every day | ORAL | 3 refills | Status: DC

## 2020-05-23 MED ORDER — METFORMIN HCL 1000 MG PO TABS: 1000.0000 mg | ORAL_TABLET | Freq: Two times a day (BID) | ORAL | 3 refills | Status: DC

## 2020-05-23 MED ORDER — ATORVASTATIN CALCIUM 80 MG PO TABS: 80.0000 mg | ORAL_TABLET | Freq: Every day | ORAL | 0 refills | Status: DC

## 2020-05-23 MED ORDER — GLIPIZIDE 5 MG PO TABS: 5.0000 mg | ORAL_TABLET | Freq: Every day | ORAL | 0 refills | Status: DC

## 2020-05-23 MED ORDER — OMEPRAZOLE 40 MG PO CPDR: 40.0000 mg | DELAYED_RELEASE_CAPSULE | Freq: Every day | ORAL | 0 refills | Status: DC

## 2020-05-23 MED ORDER — LOSARTAN POTASSIUM 100 MG PO TABS: 100.0000 mg | ORAL_TABLET | Freq: Every day | ORAL | 0 refills | Status: DC

## 2020-05-23 NOTE — Patient Instructions (Signed)
Ms. Schnee,  It was a pleasure to see you today. I have ordered xrays of your right hip. Please go upstairs to the radiology department to get these done.   For your diarrhea and rash, I have given you a prescription for predniosone. Please take as instructed for 4 weeks total.   Follow up with me again in 3 months or sooner if needed. If you have any questions or concerns, call our clinic at [phone] or after hours call [phone] and ask for the internal medicine resident on call.  Thanks!  Dr. Philipp Ovens

## 2020-05-24 ENCOUNTER — Encounter: Payer: Self-pay | Admitting: Internal Medicine

## 2020-05-24 NOTE — Assessment & Plan Note (Signed)
Continue Lipitor 80 mg daily. Last lipid panel checked in October with LDL of 76.

## 2020-05-24 NOTE — Assessment & Plan Note (Signed)
Patient follows with GI, unfortunately has been off Humira since January due to coverage issues. She is uninsured and receives the medicine through a patient assistance program. She is waiting on some tax documents so she can reapply. Since stopping treatment, she is now having diarrhea and abdominal pain. She reports a minimum of 5 bowel movements a day. She denies blood in her stool. Plan to treat for Crohn's flair. Encouraged her to complete paper work with her gastroenterologist's office to resume Humira treatment.  -- Prednisone taper, 40 mg daily x 1 week, then 20 mg daily x 1 week, then 10 mg daily x 1 week, then 5 mg daily x 1 week

## 2020-05-24 NOTE — Assessment & Plan Note (Signed)
Improved on increased dose of losartan 100 mg daily. She also takes metoprolol XL 100 mg daily. She has labs checked in December with normal renal function. Continue current regimen.

## 2020-05-24 NOTE — Progress Notes (Signed)
Subjective:   Patient ID: Kendra Adams female   DOB: 09-01-1960 60 y.o.   MRN: [id number]  HPI: [name] is a 60 y.o. female with past medical history outlined below here for follow up of her chronic medical conditions. For the details of today's visit, please refer to the assessment and plan.   Past Medical History:  Diagnosis Date  . Allergic rhinitis   . Asthma   . Atopic dermatitis   . CAD (coronary artery disease)    a. 12/13/10: mRCA 99%, EF 65-70%;  s/p BMS to mRCA  . COPD (chronic obstructive pulmonary disease) ([place])   . Depression   . DM2 (diabetes mellitus, type 2) ([place])   . GERD (gastroesophageal reflux disease)   . GI bleed   . HTN (hypertension)   . Hyperlipidemia   . Hyperlipidemia   . Hypothyroidism   . Psoriasis   . Seasonal allergies   . Tobacco abuse    Current Outpatient Medications  Medication Sig Dispense Refill  . predniSONE (DELTASONE) 10 MG tablet Take 4 tablets (40 mg) once daily x 1 week, then 2 tablets (20 mg) once daily x 1 week, then 1 tablet (10 mg) once daily x 1 week, then 1/2 a tablet (5 mg) once daily x 1 week then stop. 53 tablet 0  . rOPINIRole (REQUIP) 1 MG tablet TAKE 1 TABLET BY MOUTH AT BEDTIME 90 tablet 0  . ACCU-CHEK GUIDE test strip USE  STRIP TO CHECK GLUCOSE ONCE DAILY 50 each 0  . Accu-Chek Softclix Lancets lancets USE   TO CHECK GLUCOSE ONCE DAILY 100 each 0  . albuterol (PROVENTIL HFA;VENTOLIN HFA) 108 (90 Base) MCG/ACT inhaler Inhale 1-2 puffs into the lungs every 6 (six) hours as needed for wheezing or shortness of breath. 1 Inhaler 0  . aspirin 81 MG chewable tablet Chew 81 mg by mouth every morning.     Marland Kitchen atorvastatin (LIPITOR) 80 MG tablet Take 1 tablet (80 mg total) by mouth daily. 90 tablet 0  . betamethasone dipropionate (DIPROLENE) 0.05 % cream Apply topically 2 (two) times daily. (Patient taking differently: Apply 1 application topically 2 (two) times daily as needed (psoriasis). ) 45 g 2  . buPROPion  (WELLBUTRIN SR) 150 MG 12 hr tablet Take 1 tablet (150 mg total) by mouth 2 (two) times daily. 60 tablet 0  . cetirizine (EQ ALLERGY RELIEF, CETIRIZINE,) 10 MG tablet Take 1 tablet (10 mg total) by mouth daily. 30 tablet 2  . ferrous sulfate 325 (65 FE) MG EC tablet Take 1 tablet (325 mg total) by mouth daily with breakfast. 30 tablet 11  . fluconazole (DIFLUCAN) 150 MG tablet Take one tablet by mouth. Take second tablet by mouth in 3 days if symptoms persist. 2 tablet 0  . fluticasone (FLONASE) 50 MCG/ACT nasal spray Place 2 sprays into both nostrils daily. 16 g 2  . glipiZIDE (GLUCOTROL) 5 MG tablet Take 1 tablet (5 mg total) by mouth daily. 90 tablet 0  . levothyroxine (SYNTHROID) 150 MCG tablet Take 1 tablet (150 mcg total) by mouth daily before breakfast. 90 tablet 3  . loratadine (CLARITIN) 10 MG tablet Take 1 tablet (10 mg total) by mouth daily. 30 tablet 2  . losartan (COZAAR) 100 MG tablet Take 1 tablet (100 mg total) by mouth at bedtime. 90 tablet 0  . metFORMIN (GLUCOPHAGE) 1000 MG tablet Take 1 tablet (1,000 mg total) by mouth 2 (two) times daily. 180 tablet 3  . metoprolol succinate (TOPROL-XL)  100 MG 24 hr tablet TAKE 1 TABLET BY MOUTH ONCE DAILY WITH  OR  IMMEDIATELY  FOLLOWING  A  MEAL 90 tablet 3  . nitroGLYCERIN (NITROSTAT) 0.4 MG SL tablet Place 1 tablet (0.4 mg total) under the tongue every 5 (five) minutes as needed for chest pain. 25 tablet 3  . omeprazole (PRILOSEC) 40 MG capsule Take 1 capsule (40 mg total) by mouth daily. 90 capsule 0  . ondansetron (ZOFRAN) 4 MG tablet Take 1 tablet (4 mg total) by mouth every 8 (eight) hours as needed for nausea or vomiting. 4 tablet 0  . sertraline (ZOLOFT) 100 MG tablet Take 1.5 tablets (150 mg total) by mouth daily. 45 tablet 2  . Spacer/Aero-Holding Chambers (AEROCHAMBER PLUS) inhaler Use as instructed 1 each 2  . triamcinolone cream (KENALOG) 0.1 % Apply 1 application topically 2 (two) times daily. 453.6 g 1   No current  facility-administered medications for this visit.   Family History  Problem Relation Age of Onset  . Diabetes Mother   . Hypertension Mother    Social History   Socioeconomic History  . Marital status: Married    Spouse name: Not on file  . Number of children: 0  . Years of education: 10th grade  . Highest education level: Not on file  Occupational History  . Occupation: Conservation officer, nature    Comment: self-employed  . 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 Smoking status: Current Every Day Smoker    Packs/day: 0.30    Years: 40.00    Pack years: 12.00    Types: Cigarettes    Last attempt to quit: 05/19/2014    Years since quitting: 6.0  . Smokeless tobacco: Never Used  . Tobacco comment: 2 cigs/day.  Vaping Use  . Vaping Use: Some days  Substance and Sexual Activity  . Alcohol use: No    Alcohol/week: 0.0 standard drinks  . Drug use: Yes    Frequency: 7.0 times per week    Types: Marijuana    Comment: Marijuana Use  . Sexual activity: Not on file  Other Topics Concern  . Not on file  Social History Narrative   Father died of suicide (gunshot) in 30. 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       Patient lives with her husband, her husband's niece Tammy who is mentally retarded and her best friend and the friend's daughter and 6 dogs               Social Determinants of Radio broadcast assistant Strain: Not on file  Food Insecurity: Not on file  Transportation Needs: Not on file  Physical Activity: Not on file  Stress: Not on file  Social Connections: Not on file    Review of Systems: Review of Systems  Gastrointestinal: Positive for abdominal pain and diarrhea.  Skin: Positive for itching and rash.     Objective:  Physical Exam:  Vitals:   05/23/20 1048  BP: 131/74  Pulse: 80  Temp: 98.4 F (36.9 C)  TempSrc: Oral  SpO2: 97%   Weight: 257 lb 12.8 oz (116.9 kg)  Height: 5' 3"  (1.6 m)    Physical Exam Constitutional:      Appearance: Normal appearance.  Cardiovascular:     Rate and Rhythm: Normal rate and regular rhythm.  Heart sounds: Normal heart sounds.  Pulmonary:     Effort: Pulmonary effort is normal. No respiratory distress.     Breath sounds: Normal breath sounds.  Skin:    Comments: Diffuse raised, erythematous rash with skin thickening, dry scales, and excoriations involving her bilateral arms, chest, and legs. She has a few small patches of erythema with silvery scales over the bilateral knees and left elbow.   Neurological:     Mental Status: She is alert and oriented to person, place, and time.  Psychiatric:        Mood and Affect: Mood normal.        Behavior: Behavior normal.      Assessment & Plan:   See Encounters Tab for problem based charting.

## 2020-05-24 NOTE — Assessment & Plan Note (Signed)
Patient has a reported history of psoriasis and atopic dermatitis. I do not see that she has ever had a skin biopsy. In the past, her rash has always appeared more consistent with atopic dermatitis and she was being managed on PRN topical corticosteroids. We have been unsuccessful in getting her established with dermatology because she in uninsured. She started Humira last year for treatment of her new Chron's diagnosis, and she reports complete resolution of her rash. Since stopping treatment two months ago, she has had a severe flair with a diffuse rash involving her bilateral arms, legs, and chest. This is the worst I have seen it. The rash is atypical for psoriasis, but given positive response to humira I am now favoring a true psoriasis diagnosis. We are treating with prednisone for a Chron's flair. This should hopefully help with her rash as well. I do not think topicals will be helpful given the diffuse nature of the rash. She is reapplying for patient assistance for Humira.

## 2020-05-24 NOTE — Assessment & Plan Note (Addendum)
Rechecking TSH.  ADDENDUM: Mildly above goal, but improved from last check. Attempted to call patient with results, no answer. She only recently started taking synthroid appropriately (empty stomach, prior to taking other medications). Will continue current synthroid dose and recheck at follow up.

## 2020-05-24 NOTE — Assessment & Plan Note (Signed)
Hgb A1c today is 6.9. She is taking glipizide 5 mg daily. Will need close CBG monitoring while taking prednisone. I have refilled her lancets and test strips.

## 2020-05-24 NOTE — Assessment & Plan Note (Deleted)
Unfortunately patient has been off Humira since January due to coverage issues. She is uninsured and receives the medicine through a patient assistance program. She is waiting on some tax documents so she can reapply. Since stopping treatment, she is now having diarrhea and abdominal pain. She reports a minimum of 5 bowel movements a day. She denies blood in her stool. Plan to treat for Crohn's flair. Encouraged her to complete paper work with her gastroenterologist's office to resume Humira treatment.  -- Prednisone taper, 40 mg daily x 1 week, then 20 mg daily x 1 week, then 10 mg daily x 1 week, then 5 mg daily x 1 week

## 2020-05-25 ENCOUNTER — Other Ambulatory Visit: Payer: Self-pay | Admitting: Internal Medicine

## 2020-05-25 DIAGNOSIS — E1159 Type 2 diabetes mellitus with other circulatory complications: Secondary | ICD-10-CM

## 2020-05-25 LAB — TSH: TSH: 6.66 u[IU]/mL — ABNORMAL HIGH (ref 0.450–4.500)

## 2020-05-25 NOTE — Assessment & Plan Note (Signed)
Complaining of hip pain worsening after a recent fall. Xrays show mild to moderate bilateral osteoarthritis but no acute findings. Attempted to call patient with results, no answer.

## 2020-05-26 ENCOUNTER — Encounter: Payer: Self-pay | Admitting: Internal Medicine

## 2020-05-30 ENCOUNTER — Encounter: Payer: Self-pay | Admitting: Internal Medicine

## 2020-06-12 ENCOUNTER — Observation Stay (HOSPITAL_COMMUNITY)
Admission: EM | Admit: 2020-06-12 | Discharge: 2020-06-14 | Disposition: A | Discharge status: Home / Self Care | Payer: Self-pay | Attending: Internal Medicine | Admitting: Internal Medicine

## 2020-06-12 ENCOUNTER — Ambulatory Visit (HOSPITAL_COMMUNITY)
Admission: EM | Admit: 2020-06-12 | Discharge: 2020-06-12 | Disposition: A | Discharge status: Home / Self Care | Payer: Self-pay | Attending: Emergency Medicine | Admitting: Emergency Medicine

## 2020-06-12 ENCOUNTER — Encounter (HOSPITAL_COMMUNITY): Payer: Self-pay

## 2020-06-12 ENCOUNTER — Other Ambulatory Visit: Payer: Self-pay

## 2020-06-12 ENCOUNTER — Emergency Department (HOSPITAL_COMMUNITY): Payer: Self-pay

## 2020-06-12 DIAGNOSIS — E039 Hypothyroidism, unspecified: Secondary | ICD-10-CM | POA: Insufficient documentation

## 2020-06-12 DIAGNOSIS — S81032A Puncture wound without foreign body, left knee, initial encounter: Principal | ICD-10-CM | POA: Insufficient documentation

## 2020-06-12 DIAGNOSIS — Z23 Encounter for immunization: Secondary | ICD-10-CM | POA: Insufficient documentation

## 2020-06-12 DIAGNOSIS — E119 Type 2 diabetes mellitus without complications: Secondary | ICD-10-CM | POA: Insufficient documentation

## 2020-06-12 DIAGNOSIS — Z955 Presence of coronary angioplasty implant and graft: Secondary | ICD-10-CM | POA: Insufficient documentation

## 2020-06-12 DIAGNOSIS — Y9301 Activity, walking, marching and hiking: Secondary | ICD-10-CM | POA: Insufficient documentation

## 2020-06-12 DIAGNOSIS — I2510 Atherosclerotic heart disease of native coronary artery without angina pectoris: Secondary | ICD-10-CM | POA: Insufficient documentation

## 2020-06-12 DIAGNOSIS — W450XXA Nail entering through skin, initial encounter: Secondary | ICD-10-CM | POA: Insufficient documentation

## 2020-06-12 DIAGNOSIS — N390 Urinary tract infection, site not specified: Secondary | ICD-10-CM | POA: Diagnosis present

## 2020-06-12 DIAGNOSIS — B9620 Unspecified Escherichia coli [E. coli] as the cause of diseases classified elsewhere: Secondary | ICD-10-CM | POA: Insufficient documentation

## 2020-06-12 DIAGNOSIS — Z8616 Personal history of COVID-19: Secondary | ICD-10-CM | POA: Insufficient documentation

## 2020-06-12 DIAGNOSIS — B9689 Other specified bacterial agents as the cause of diseases classified elsewhere: Secondary | ICD-10-CM | POA: Insufficient documentation

## 2020-06-12 DIAGNOSIS — F17210 Nicotine dependence, cigarettes, uncomplicated: Secondary | ICD-10-CM | POA: Insufficient documentation

## 2020-06-12 DIAGNOSIS — J45909 Unspecified asthma, uncomplicated: Secondary | ICD-10-CM | POA: Insufficient documentation

## 2020-06-12 DIAGNOSIS — M1712 Unilateral primary osteoarthritis, left knee: Secondary | ICD-10-CM

## 2020-06-12 DIAGNOSIS — J449 Chronic obstructive pulmonary disease, unspecified: Secondary | ICD-10-CM | POA: Insufficient documentation

## 2020-06-12 DIAGNOSIS — Z7984 Long term (current) use of oral hypoglycemic drugs: Secondary | ICD-10-CM | POA: Insufficient documentation

## 2020-06-12 DIAGNOSIS — N179 Acute kidney failure, unspecified: Secondary | ICD-10-CM | POA: Insufficient documentation

## 2020-06-12 DIAGNOSIS — Z87440 Personal history of urinary (tract) infections: Secondary | ICD-10-CM | POA: Diagnosis present

## 2020-06-12 DIAGNOSIS — Z20822 Contact with and (suspected) exposure to covid-19: Secondary | ICD-10-CM | POA: Insufficient documentation

## 2020-06-12 DIAGNOSIS — I10 Essential (primary) hypertension: Secondary | ICD-10-CM | POA: Insufficient documentation

## 2020-06-12 DIAGNOSIS — N3000 Acute cystitis without hematuria: Secondary | ICD-10-CM | POA: Insufficient documentation

## 2020-06-12 DIAGNOSIS — Z79899 Other long term (current) drug therapy: Secondary | ICD-10-CM | POA: Insufficient documentation

## 2020-06-12 DIAGNOSIS — R509 Fever, unspecified: Secondary | ICD-10-CM

## 2020-06-12 DIAGNOSIS — M25562 Pain in left knee: Secondary | ICD-10-CM

## 2020-06-12 DIAGNOSIS — Z7982 Long term (current) use of aspirin: Secondary | ICD-10-CM | POA: Insufficient documentation

## 2020-06-12 LAB — CBC WITH DIFFERENTIAL/PLATELET
Abs Immature Granulocytes: 0.06 10*3/uL (ref 0.00–0.07)
Basophils Absolute: 0.1 10*3/uL (ref 0.0–0.1)
Basophils Relative: 0 %
Eosinophils Absolute: 0.2 10*3/uL (ref 0.0–0.5)
Eosinophils Relative: 1 %
HCT: 40.9 % (ref 36.0–46.0)
Hemoglobin: 13.2 g/dL (ref 12.0–15.0)
Immature Granulocytes: 0 %
Lymphocytes Relative: 11 %
Lymphs Abs: 1.7 10*3/uL (ref 0.7–4.0)
MCH: 30.5 pg (ref 26.0–34.0)
MCHC: 32.3 g/dL (ref 30.0–36.0)
MCV: 94.5 fL (ref 80.0–100.0)
Monocytes Absolute: 1.6 10*3/uL — ABNORMAL HIGH (ref 0.1–1.0)
Monocytes Relative: 11 %
Neutro Abs: 11.2 10*3/uL — ABNORMAL HIGH (ref 1.7–7.7)
Neutrophils Relative %: 77 %
Platelets: 225 10*3/uL (ref 150–400)
RBC: 4.33 MIL/uL (ref 3.87–5.11)
RDW: 13.9 % (ref 11.5–15.5)
WBC: 14.8 10*3/uL — ABNORMAL HIGH (ref 4.0–10.5)
nRBC: 0 % (ref 0.0–0.2)

## 2020-06-12 LAB — BASIC METABOLIC PANEL
Anion gap: 11 (ref 5–15)
BUN: 14 mg/dL (ref 8–23)
CO2: 24 mmol/L (ref 22–32)
Calcium: 9.4 mg/dL (ref 8.9–10.3)
Chloride: 101 mmol/L (ref 98–111)
Creatinine, Ser: 1.18 mg/dL — ABNORMAL HIGH (ref 0.44–1.00)
GFR, Estimated: 53 mL/min — ABNORMAL LOW (ref >60)
Glucose, Bld: 109 mg/dL — ABNORMAL HIGH (ref 70–99)
Potassium: 3.8 mmol/L (ref 3.5–5.1)
Sodium: 136 mmol/L (ref 135–145)

## 2020-06-12 LAB — URINALYSIS, ROUTINE W REFLEX MICROSCOPIC
Bilirubin Urine: NEGATIVE
Glucose, UA: NEGATIVE mg/dL
Ketones, ur: NEGATIVE mg/dL
Nitrite: POSITIVE — AB
Protein, ur: 30 mg/dL — AB
Specific Gravity, Urine: 1.019 (ref 1.005–1.030)
WBC, UA: >50 WBC/hpf — ABNORMAL HIGH (ref 0–5)
pH: 5 (ref 5.0–8.0)

## 2020-06-12 LAB — RESP PANEL BY RT-PCR (FLU A&B, COVID) ARPGX2
Influenza A by PCR: NEGATIVE
Influenza B by PCR: NEGATIVE
SARS Coronavirus 2 by RT PCR: NEGATIVE

## 2020-06-12 MED ORDER — ACETAMINOPHEN 325 MG PO TABS
ORAL_TABLET | ORAL | Status: AC
  Filled 2020-06-12: qty 2

## 2020-06-12 MED ORDER — SODIUM CHLORIDE 0.9 % IV BOLUS
1000.0000 mL | Freq: Once | INTRAVENOUS | Status: AC
  Administered 2020-06-13: 1000 mL via INTRAVENOUS

## 2020-06-12 MED ORDER — LIDOCAINE-EPINEPHRINE (PF) 2 %-1:200000 IJ SOLN
10.0000 mL | Freq: Once | INTRAMUSCULAR | Status: AC
  Administered 2020-06-13: 10 mL via INTRADERMAL
  Filled 2020-06-12: qty 20

## 2020-06-12 MED ORDER — ACETAMINOPHEN 325 MG PO TABS
650.0000 mg | ORAL_TABLET | Freq: Once | ORAL | Status: AC
  Administered 2020-06-12: 650 mg via ORAL

## 2020-06-12 NOTE — ED Triage Notes (Signed)
Emergency Medicine Provider Triage Evaluation Note  Kendra Adams , a 60 y.o. female  was evaluated in triage.  Pt complains of left knee pain x 1 week worse with ambulation. Patient fell 1 week ago, injuring her left knee. She also admits to a fever that started today. Tmax 103F. She was evaluated at [facility] prior to arrival and sent to the ED to rule out septic joint. No cough, sore throat, nasal congestion, dysuria. No chest pain or shortness of breath  Review of Systems  Positive: Fever, left knee pain   Physical Exam  BP (!) 119/55 (BP Location: Left Arm)   Pulse 96   Temp (!) 101.3 F (38.5 C) (Oral)   Resp 16   Ht 5' 3"  (1.6 m)   Wt 116.9 kg   SpO2 96%   BMI 45.65 kg/m  Gen:   Awake, no distress   HEENT:  Atraumatic  Resp:  Normal effort  Cardiac:  Normal rate  Abd:   Nondistended, nontender  MSK:   Tenderness throughout anterior aspect of knee. Full extension and flexion of knee. Mild erythema. Neuro:  Speech clear   Medical Decision Making  Medically screening exam initiated at 8:31 PM.  Appropriate orders placed.  Kendra Adams was informed that the remainder of the evaluation will be completed by another provider, this initial triage assessment does not replace that evaluation, and the importance of remaining in the ED until their evaluation is complete.  Clinical Impression  Left knee pain status post injury 1 week ago. Fever started today. Patient able to fully extend and flex left knee. X-ray to rule out bony fractures. Labs, UA, COVID test for other etiologies of fever.    Suzy Bouchard, [place] 06/12/20 2035

## 2020-06-12 NOTE — ED Triage Notes (Signed)
Pt reports cutting bamboo and loading a rotten picnic table to a truck. She states she injured her left knee. She states she punctured her knee with a nail. Pt states she does not remember when she last received a tetanus shot. She states she was running a fever earlier.

## 2020-06-12 NOTE — ED Triage Notes (Addendum)
Left knee pain from a fall about a week ago, and difficulty to move/ bend knee. Redness and warm to touch reported by pt.  Pt was seen at urgent care and had a fever of 103 and received tylenol.  Denies any chest pain and cough. [id number]

## 2020-06-12 NOTE — ED Provider Notes (Signed)
[facility] EMERGENCY DEPARTMENT Provider Note   CSN: [id number] Arrival date & time: 06/12/20  2019     History Chief Complaint  Patient presents with  . Knee Pain  . Fever    Kendra Adams is a 60 y.o. female.  The history is provided by the patient and medical records. No language interpreter was used.  Knee Pain Location:  Knee Time since incident:  1 week Injury: yes   Mechanism of injury: fall   Mechanism of injury comment:  With puncture of nail Fall:    Fall occurred:  Walking   Point of impact:  Knees   Entrapped after fall: no   Knee location:  L knee Pain details:    Quality:  Aching and pressure   Radiates to:  Does not radiate   Severity:  Moderate   Onset quality:  Gradual   Timing:  Constant   Progression:  Worsening Chronicity:  New Dislocation: no   Foreign body present:  Unable to specify Tetanus status:  Out of date Prior injury to area:  No Relieved by:  Nothing Worsened by:  Nothing Ineffective treatments:  None tried Associated symptoms: fatigue, fever and swelling   Associated symptoms: no back pain, no muscle weakness and no neck pain        Past Medical History:  Diagnosis Date  . Allergic rhinitis   . Asthma   . Atopic dermatitis   . CAD (coronary artery disease)    a. 12/13/10: mRCA 99%, EF 65-70%;  s/p BMS to mRCA  . COPD (chronic obstructive pulmonary disease) ([place])   . Depression   . DM2 (diabetes mellitus, type 2) ([place])   . GERD (gastroesophageal reflux disease)   . GI bleed   . HTN (hypertension)   . Hyperlipidemia   . Hyperlipidemia   . Hypothyroidism   . Psoriasis   . Seasonal allergies   . Tobacco abuse     Patient Active Problem List   Diagnosis Date Noted  . COVID-19 virus infection 04/05/2020  . Chest wall contusion, right, subsequent encounter 12/24/2019  . Pyelonephritis 11/11/2019  . Postnasal drip 09/27/2019  . Ear pain, right 03/23/2019  . Need for vaccination against hepatitis  B virus 12/02/2018  . Hypocalcemia 12/02/2018  . Olecranon bursitis of left elbow 12/02/2018  . Preoperative clearance 09/27/2018  . Crohn's colitis ([place]) 09/22/2018  . Anemia 09/22/2018  . Aortic atherosclerosis ([place]) 09/12/2018  . Hip pain 05/27/2017  . Dyspareunia, female 04/06/2017  . Atopic dermatitis 01/01/2017  . History of restless legs syndrome 11/24/2016  . 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 Psoriasis 10/15/2015  . Elevated transaminase level 08/12/2015  . Tendinitis of left rotator cuff 01/19/2015  . Tobacco use disorder 09/07/2014  . Dysuria 01/07/2013  . Morbid obesity ([place]) 03/19/2011  . Cough 03/17/2011  . Routine health maintenance 02/03/2011  . CAD (coronary artery disease) 12/26/2010  . Diabetes mellitus type 2 with complications ([place]) 80/32/1224  . Hypertension associated with diabetes ([place]) 05/10/2008  . Hypothyroidism 04/27/2008  . HLD (hyperlipidemia) 04/27/2008  . Depression 04/27/2008  . GERD 04/27/2008    Past Surgical History:  Procedure Laterality Date  . BIOPSY  10/28/2018   Procedure: BIOPSY;  Surgeon: Juanita Craver, MD;  Location: [facility];  Service: Endoscopy;;  . CARDIAC CATHETERIZATION N/A 08/14/2015   Procedure: Left Heart Cath and Coronary Angiography;  Surgeon: Peter M Martinique, MD;  Location: [place] [facility];  Service: Cardiovascular;  Laterality:  N/A;  . CARPAL TUNNEL RELEASE     w/ bone spurs  . CHOLECYSTECTOMY N/A 08/15/2015   Procedure: LAPAROSCOPIC CHOLECYSTECTOMY WITH ATTEMPTED INTRAOPERATIVE CHOLANGIOGRAM;  Surgeon: Erroll Luna, MD;  Location: [place];  Service: General;  Laterality: N/A;  . COLONOSCOPY WITH PROPOFOL N/A 10/28/2018   Procedure: COLONOSCOPY WITH PROPOFOL;  Surgeon: Juanita Craver, MD;  Location: [facility];  Service: Endoscopy;  Laterality: N/A;  . CORONARY ANGIOPLASTY WITH STENT PLACEMENT    . DILATION AND CURETTAGE OF UTERUS    . LEFT HEART CATH AND CORONARY ANGIOGRAPHY N/A 11/23/2019    Procedure: LEFT HEART CATH AND CORONARY ANGIOGRAPHY;  Surgeon: Burnell Blanks, MD;  Location: [place] [facility];  Service: Cardiovascular;  Laterality: N/A;     OB History    Gravida  2   Para      Term      Preterm      AB  2   Living        SAB  1   IAB  1   Ectopic      Multiple      Live Births              Family History  Problem Relation Age of Onset  . Diabetes Mother   . Hypertension Mother     Social History   Tobacco Use  . Smoking status: Current Every Day Smoker    Packs/day: 0.30    Years: 40.00    Pack years: 12.00    Types: Cigarettes    Last attempt to quit: 05/19/2014    Years since quitting: 6.0  . Smokeless tobacco: Never Used  . Tobacco comment: 2 cigs/day.  Vaping Use  . Vaping Use: Some days  Substance Use Topics  . Alcohol use: No    Alcohol/week: 0.0 standard drinks  . Drug use: Yes    Frequency: 7.0 times per week    Types: Marijuana    Comment: Marijuana Use    Home Medications Prior to Admission medications   Medication Sig Start Date End Date Taking? Authorizing Provider  rOPINIRole (REQUIP) 1 MG tablet TAKE 1 TABLET BY MOUTH AT BEDTIME 04/04/20   [name], MD  ACCU-CHEK GUIDE test strip USE  STRIP TO CHECK GLUCOSE ONCE DAILY 05/24/20   [name], MD  Accu-Chek Softclix Lancets lancets USE   TO CHECK GLUCOSE ONCE DAILY 05/24/20   [name], MD  albuterol (PROVENTIL HFA;VENTOLIN HFA) 108 (90 Base) MCG/ACT inhaler Inhale 1-2 puffs into the lungs every 6 (six) hours as needed for wheezing or shortness of breath. 01/01/18   [name], MD  aspirin 81 MG chewable tablet Chew 81 mg by mouth every morning.     [provider]  atorvastatin (LIPITOR) 80 MG tablet Take 1 tablet (80 mg total) by mouth daily. 05/23/20   [name], MD  betamethasone dipropionate (DIPROLENE) 0.05 % cream Apply topically 2 (two) times daily. Patient taking differently: Apply 1 application  topically 2 (two) times daily as needed (psoriasis).  02/15/18   [name], MD  buPROPion (WELLBUTRIN SR) 150 MG 12 hr tablet Take 1 tablet (150 mg total) by mouth 2 (two) times daily. 05/23/20   [name], MD  cetirizine (EQ ALLERGY RELIEF, CETIRIZINE,) 10 MG tablet Take 1 tablet (10 mg total) by mouth daily. 04/03/20   [name], MD  ferrous sulfate 325 (65 FE) MG EC tablet Take 1 tablet (325 mg total) by mouth daily with breakfast. 10/18/18 12/21/19  Jean Rosenthal, MD  fluconazole (DIFLUCAN) 150 MG tablet Take one tablet by mouth. Take second tablet by mouth in 3 days if symptoms persist. 01/25/20   [name], MD  fluticasone (FLONASE) 50 MCG/ACT nasal spray Place 2 sprays into both nostrils daily. 09/26/19   [place], Carley D, DO  glipiZIDE (GLUCOTROL) 5 MG tablet Take 1 tablet (5 mg total) by mouth daily. 05/23/20   [name], MD  levothyroxine (SYNTHROID) 150 MCG tablet Take 1 tablet (150 mcg total) by mouth daily before breakfast. 05/23/20   [name], MD  loratadine (CLARITIN) 10 MG tablet Take 1 tablet (10 mg total) by mouth daily. 02/15/18   [name], MD  losartan (COZAAR) 100 MG tablet Take 1 tablet (100 mg total) by mouth at bedtime. 05/23/20   [name], MD  metFORMIN (GLUCOPHAGE) 1000 MG tablet Take 1 tablet (1,000 mg total) by mouth 2 (two) times daily. 05/23/20   [name], MD  metoprolol succinate (TOPROL-XL) 100 MG 24 hr tablet TAKE 1 TABLET BY MOUTH ONCE DAILY WITH  OR  IMMEDIATELY  FOLLOWING  A  MEAL 12/13/19   Dorothy Spark, MD  nitroGLYCERIN (NITROSTAT) 0.4 MG SL tablet Place 1 tablet (0.4 mg total) under the tongue every 5 (five) minutes as needed for chest pain. 10/26/19   [name], MD  omeprazole (PRILOSEC) 40 MG capsule Take 1 capsule (40 mg total) by mouth daily. 05/23/20   [name], MD  ondansetron (ZOFRAN) 4 MG tablet Take 1 tablet (4 mg total) by mouth every 8 (eight) hours as needed for  nausea or vomiting. 11/10/19   Darr, Edison Nasuti, PA-C  predniSONE (DELTASONE) 10 MG tablet Take 4 tablets (40 mg) once daily x 1 week, then 2 tablets (20 mg) once daily x 1 week, then 1 tablet (10 mg) once daily x 1 week, then 1/2 a tablet (5 mg) once daily x 1 week then stop. 05/23/20   [name], MD  sertraline (ZOLOFT) 100 MG tablet Take 1.5 tablets (150 mg total) by mouth daily. 04/03/20   [name], MD  Spacer/Aero-Holding Chambers (AEROCHAMBER PLUS) inhaler Use as instructed 02/03/15   Melynda Ripple, MD  triamcinolone cream (KENALOG) 0.1 % Apply 1 application topically 2 (two) times daily. 10/26/19   [name], MD    Allergies    Nsaids, Opium, Tramadol hcl, and Gabapentin  Review of Systems   Review of Systems  Constitutional: Positive for chills, fatigue and fever.  HENT: Negative for congestion.   Eyes: Negative for visual disturbance.  Respiratory: Negative for cough, chest tightness and shortness of breath.   Cardiovascular: Negative for chest pain and palpitations.  Gastrointestinal: Negative for abdominal pain, constipation, diarrhea, nausea and vomiting.  Genitourinary: Positive for dysuria. Negative for flank pain and frequency.  Musculoskeletal: Positive for joint swelling. Negative for back pain, neck pain and neck stiffness.  Skin: Positive for wound. Negative for rash.  Neurological: Negative for weakness, light-headedness, numbness and headaches.  Psychiatric/Behavioral: Negative for agitation and confusion.  All other systems reviewed and are negative.   Physical Exam Updated Vital Signs BP (!) 119/55 (BP Location: Left Arm)   Pulse 96   Temp (!) 101.3 F (38.5 C) (Oral)   Resp 16   Ht 5' 3"  (1.6 m)   Wt 116.9 kg   SpO2 96%   BMI 45.65 kg/m   Physical Exam Vitals and nursing note reviewed.  Constitutional:      General: She is not in acute distress.  Appearance: She is well-developed. She is not ill-appearing, toxic-appearing or  diaphoretic.  HENT:     Head: Normocephalic and atraumatic.     Nose: Nose normal.     Mouth/Throat:     Mouth: Mucous membranes are moist.     Pharynx: No oropharyngeal exudate or posterior oropharyngeal erythema.  Eyes:     Conjunctiva/sclera: Conjunctivae normal.     Pupils: Pupils are equal, round, and reactive to light.  Cardiovascular:     Rate and Rhythm: Normal rate and regular rhythm.     Pulses: Normal pulses.     Heart sounds: No murmur heard.   Pulmonary:     Effort: Pulmonary effort is normal. No respiratory distress.     Breath sounds: Normal breath sounds. No wheezing, rhonchi or rales.  Chest:     Chest wall: No tenderness.  Abdominal:     General: Abdomen is flat.     Palpations: Abdomen is soft.     Tenderness: There is no abdominal tenderness. There is no right CVA tenderness, left CVA tenderness, guarding or rebound.  Musculoskeletal:        General: Swelling and tenderness present.     Cervical back: Neck supple.     Left knee: Swelling and effusion present. No bony tenderness or crepitus. Tenderness present.       Legs:  Skin:    General: Skin is warm and dry.     Capillary Refill: Capillary refill takes less than 2 seconds.     Findings: No erythema.  Neurological:     General: No focal deficit present.     Mental Status: She is alert.     ED Results / Procedures / Treatments   Labs (all labs ordered are listed, but only abnormal results are displayed) Labs Reviewed  CBC WITH DIFFERENTIAL/PLATELET - Abnormal; Notable for the following components:      Result Value   WBC 14.8 (*)    Neutro Abs 11.2 (*)    Monocytes Absolute 1.6 (*)    All other components within normal limits  BASIC METABOLIC PANEL - Abnormal; Notable for the following components:   Glucose, Bld 109 (*)    Creatinine, Ser 1.18 (*)    GFR, Estimated 53 (*)    All other components within normal limits  URINALYSIS, ROUTINE W REFLEX MICROSCOPIC - Abnormal; Notable for the  following components:   APPearance CLOUDY (*)    Hgb urine dipstick SMALL (*)    Protein, ur 30 (*)    Nitrite POSITIVE (*)    Leukocytes,Ua LARGE (*)    WBC, UA >50 (*)    Bacteria, UA MANY (*)    Non Squamous Epithelial 0-5 (*)    All other components within normal limits  SYNOVIAL CELL COUNT + DIFF, W/ CRYSTALS - Abnormal; Notable for the following components:   Color, Synovial STRAW (*)    Appearance-Synovial HAZY (*)    WBC, Synovial 1,488 (*)    All other components within normal limits  BASIC METABOLIC PANEL - Abnormal; Notable for the following components:   Glucose, Bld 186 (*)    Creatinine, Ser 1.06 (*)    Calcium 8.8 (*)    GFR, Estimated 60 (*)    All other components within normal limits  CBC - Abnormal; Notable for the following components:   WBC 14.0 (*)    All other components within normal limits  CBG MONITORING, ED - Abnormal; Notable for the following components:   Glucose-Capillary 119 (*)  All other components within normal limits  RESP PANEL BY RT-PCR (FLU A&B, COVID) ARPGX2  BODY FLUID CULTURE W GRAM STAIN  CULTURE, BLOOD (ROUTINE X 2)  CULTURE, BLOOD (ROUTINE X 2)  URINE CULTURE  SARS CORONAVIRUS 2 (TAT 6-24 HRS)  LACTIC ACID, PLASMA  GLUCOSE, BODY FLUID OTHER  HIV ANTIBODY (ROUTINE TESTING W REFLEX)    EKG None  Radiology DG Knee Complete 4 Views Left  Result Date: 06/12/2020 CLINICAL DATA:  Fall 1 week ago.  Pain EXAM: LEFT KNEE - COMPLETE 4+ VIEW COMPARISON:  None. FINDINGS: Moderate tricompartment degenerative changes. Small joint effusion. No acute bony abnormality. Specifically, no fracture, subluxation, or dislocation. IMPRESSION: Degenerative changes with joint effusion. No acute bony abnormality. Electronically Signed   By: Rolm Baptise M.D.   On: 06/12/2020 22:37    Procedures .Joint Aspiration/Arthrocentesis  Date/Time: 06/13/2020 12:47 AM Performed by: Courtney Paris, MD Authorized by: Courtney Paris, MD    Consent:    Consent obtained:  Written   Consent given by:  Patient   Risks, benefits, and alternatives were discussed: yes     Risks discussed:  Bleeding, infection, pain and incomplete drainage   Alternatives discussed:  No treatment Universal protocol:    Procedure explained and questions answered to patient or proxy's satisfaction: yes     Imaging studies available: yes     Site/side marked: yes     Immediately prior to procedure, a time out was called: yes     Patient identity confirmed:  Verbally with patient and arm band Location:    Location:  Knee   Knee:  L knee Anesthesia:    Anesthesia method:  Local infiltration   Local anesthetic:  Lidocaine 1% WITH epi Procedure details:    Preparation: Patient was prepped and draped in usual sterile fashion     Needle gauge:  18 G   Ultrasound guidance: no     Approach:  Lateral   Aspirate amount:  15   Aspirate characteristics:  Blood-tinged, cloudy and yellow   Steroid injected: no     Specimen collected: yes   Post-procedure details:    Dressing:  Adhesive bandage   Procedure completion:  Tolerated     CRITICAL CARE Performed by: Gwenyth Allegra Aqsa Sensabaugh Total critical care time: 35 minutes Critical care time was exclusive of separately billable procedures and treating other patients. Critical care was necessary to treat or prevent imminent or life-threatening deterioration. Critical care was time spent personally by me on the following activities: development of treatment plan with patient and/or surrogate as well as nursing, discussions with consultants, evaluation of patient's response to treatment, examination of patient, obtaining history from patient or surrogate, ordering and performing treatments and interventions, ordering and review of laboratory studies, ordering and review of radiographic studies, pulse oximetry and re-evaluation of patient's condition.   Medications Ordered in ED Medications  buPROPion  (WELLBUTRIN SR) 12 hr tablet 150 mg (has no administration in time range)  sertraline (ZOLOFT) tablet 150 mg (has no administration in time range)  levothyroxine (SYNTHROID) tablet 150 mcg (has no administration in time range)  rOPINIRole (REQUIP) tablet 1 mg (has no administration in time range)  albuterol (VENTOLIN HFA) 108 (90 Base) MCG/ACT inhaler 1-2 puff (has no administration in time range)  enoxaparin (LOVENOX) injection 40 mg (has no administration in time range)  acetaminophen (TYLENOL) tablet 650 mg (has no administration in time range)    Or  acetaminophen (TYLENOL) suppository 650 mg (has no administration  in time range)  atorvastatin (LIPITOR) tablet 80 mg (has no administration in time range)  loratadine (CLARITIN) tablet 10 mg (has no administration in time range)  insulin aspart (novoLOG) injection 0-15 Units (has no administration in time range)  nicotine (NICODERM CQ - dosed in mg/24 hr) patch 7 mg (has no administration in time range)  sodium chloride 0.9 % bolus 1,000 mL (0 mLs Intravenous Stopped 06/13/20 0118)  lidocaine-EPINEPHrine (XYLOCAINE W/EPI) 2 %-1:200000 (PF) injection 10 mL (10 mLs Intradermal Given 06/13/20 0041)  Tdap (BOOSTRIX) injection 0.5 mL (0.5 mLs Intramuscular Given 06/13/20 0512)  cefTRIAXone (ROCEPHIN) 1 g in sodium chloride 0.9 % 100 mL IVPB (0 g Intravenous Stopped 06/13/20 0559)    ED Course  I have reviewed the triage vital signs and the nursing notes.  Pertinent labs & imaging results that were available during my care of the patient were reviewed by me and considered in my medical decision making (see chart for details).    MDM Rules/Calculators/A&P                          Ishani Goldwasser is a 60 y.o. female with a past medical history significant for diabetes, psoriasis currently on a steroid taper, hyperlipidemia, hypertension, Crohn's, hypothyroidism, and CAD who presents at the direction from urgent care for evaluation of left knee pain.   According to patient, she is currently on a steroid taper after having a flare of a rash 2 and half weeks ago.  She reports that is all doing better.  She says that 1 week ago, she was moving a table when it crumbled and she fell striking her left knee on the ground and puncturing it with a "rusty nail".  She reports some mild pain but tried to wash it and then let it heal.  She reports that over the last week, it has gradually worsened with pain and is now moderate to severe when she tries to bend it or walk on it.  She reports a feel swollen but it is not looking red on the outside.  She does report it feels warm.  Yesterday she started feeling subjective fevers and chills and then today had a temperature of 103.  She went to an urgent care for evaluation and they sent her here for rule out of septic arthritis versus other cause of fever.  She denies any chest pain, shortness of breath, or cough.  She denies any abdominal pain, flank pain, back pain, nausea, vomiting, constipation, or diarrhea.  She does report some mild change in sensation with urination.  She denies significant numbness or weakness of the leg and denies any overlying rash aside from the area of puncture.  She is unsure if her tetanus is up-to-date or not.  On exam, lungs are clear and chest is nontender.  Abdomen is nontender.  Normal bowel sounds.  Normal sensation and strength in the legs with normal pulses in the legs.  Patient has tenderness on the left knee more on the joint line than on the patella.  When I tried to bend it she did have some mild discomfort.  She reports the pain is worse when she tries to walk on it.  It was warm but not overly erythematous externally.  Clinically I am concerned that the patient may have septic arthritis contributing to her fevers and pain.  Patient has screening laboratory testing in triage as well as an x-ray to look for  foreign bodies from the puncture wound.  X-ray shows no foreign body or acute  bony injury but does show degenerative changes as well as knee effusion.  Urinalysis returns and does show UTI.  Suspect this is the source of some of the patient's urinary changes however with the knee pain I am concerned that there still may be septic arthritis.  Patient does have a mild leukocytosis however patient is been on steroids.  I am concerned steroids may have made patient less able to fight potential infection especially given her diabetes.  COVID and flu test negative, do not suspect this is the cause of her fevers.  Had a shared decision-making conversation patient we agreed to proceed with knee aspiration to look for septic arthritis.  We will give the patient fluid for blood pressure that is now in the 95G systolic.  She is still febrile.  Will give some Tylenol for the fever.  When blood pressure improves, will consider pain medication.  After aspiration, anticipate admission for IV antibiotics either for pyelonephritis causing hypotension and systemic signs infection versus further management of arthritis.  Will discuss with pharmacy further management of tetanus vaccination  12:46 AM Knee aspiration took place without difficulty.  Yellow and mildly cloudy aspirate was obtained, at the end there was some blood.  Will send to lab.  Patient started having severe pain so we only got around 15 cc of fluid out initially. Anticipate follow-up on these results.  3:28 AM The aspiration results appear to have returned initially.  The body fluid culture and Gram stain does not show any organisms or bacteria.  The cell count and differential does not show any evidence of septic arthritis as the white count is only 1400.  It is hazy but there are no crystals.  Suspect a mild inflammatory arthritis in the setting of the recent local injury near the joint line but lower suspicion for septic arthritis at this time.  Patient is still febrile.  She is still having intermittent tachycardia and tachypnea  and pressures were soft initially.  Patient reports still feeling very ill and given her whole picture I am concerned about UTI/pyelonephritis.  Will give IV antibiotics and admit for further monitoring of pyelonephritis and knee pain.  Spoke with pharmacy who recommended Tdap and Rocephin should cover for cellulitis coverage and UTI.    Final Clinical Impression(s) / ED Diagnoses Final diagnoses:  Urinary tract infection without hematuria, site unspecified  Acute pain of left knee  Puncture wound of left knee, initial encounter  Fever, unspecified fever cause     Clinical Impression: 1. Urinary tract infection without hematuria, site unspecified   2. Acute pain of left knee   3. Puncture wound of left knee, initial encounter   4. Fever, unspecified fever cause     Disposition: Admit  This note was prepared with assistance of Dragon voice recognition software. Occasional wrong-word or sound-a-like substitutions may have occurred due to the inherent limitations of voice recognition software.     Edrei Norgaard, Gwenyth Allegra, MD 06/13/20 [phone]

## 2020-06-12 NOTE — ED Notes (Signed)
Patient is being discharged from the Urgent Care and sent to the Emergency Department via pov . Per Hall Busing, NP, patient is in need of higher level of care due to R/O septic joint. Patient is aware and verbalizes understanding of plan of care.  Vitals:   06/12/20 1926  Pulse: 100  Resp: 15  Temp: (!) 103 F (39.4 C)  SpO2: 94%

## 2020-06-13 ENCOUNTER — Observation Stay (HOSPITAL_COMMUNITY): Payer: Self-pay

## 2020-06-13 DIAGNOSIS — Z87440 Personal history of urinary (tract) infections: Secondary | ICD-10-CM | POA: Diagnosis present

## 2020-06-13 DIAGNOSIS — N390 Urinary tract infection, site not specified: Secondary | ICD-10-CM | POA: Diagnosis present

## 2020-06-13 DIAGNOSIS — Z720 Tobacco use: Secondary | ICD-10-CM

## 2020-06-13 DIAGNOSIS — M1732 Unilateral post-traumatic osteoarthritis, left knee: Secondary | ICD-10-CM

## 2020-06-13 DIAGNOSIS — E039 Hypothyroidism, unspecified: Secondary | ICD-10-CM

## 2020-06-13 DIAGNOSIS — J449 Chronic obstructive pulmonary disease, unspecified: Secondary | ICD-10-CM

## 2020-06-13 LAB — BASIC METABOLIC PANEL
Anion gap: 6 (ref 5–15)
BUN: 14 mg/dL (ref 8–23)
CO2: 26 mmol/L (ref 22–32)
Calcium: 8.8 mg/dL — ABNORMAL LOW (ref 8.9–10.3)
Chloride: 103 mmol/L (ref 98–111)
Creatinine, Ser: 1.06 mg/dL — ABNORMAL HIGH (ref 0.44–1.00)
GFR, Estimated: 60 mL/min — ABNORMAL LOW (ref >60)
Glucose, Bld: 186 mg/dL — ABNORMAL HIGH (ref 70–99)
Potassium: 4.2 mmol/L (ref 3.5–5.1)
Sodium: 135 mmol/L (ref 135–145)

## 2020-06-13 LAB — CBG MONITORING, ED
Glucose-Capillary: 119 mg/dL — ABNORMAL HIGH (ref 70–99)
Glucose-Capillary: 160 mg/dL — ABNORMAL HIGH (ref 70–99)

## 2020-06-13 LAB — CBC
HCT: 37.1 % (ref 36.0–46.0)
Hemoglobin: 12.1 g/dL (ref 12.0–15.0)
MCH: 31.1 pg (ref 26.0–34.0)
MCHC: 32.6 g/dL (ref 30.0–36.0)
MCV: 95.4 fL (ref 80.0–100.0)
Platelets: 175 10*3/uL (ref 150–400)
RBC: 3.89 MIL/uL (ref 3.87–5.11)
RDW: 13.9 % (ref 11.5–15.5)
WBC: 14 10*3/uL — ABNORMAL HIGH (ref 4.0–10.5)
nRBC: 0 % (ref 0.0–0.2)

## 2020-06-13 LAB — SYNOVIAL CELL COUNT + DIFF, W/ CRYSTALS
Crystals, Fluid: NONE SEEN
Lymphocytes-Synovial Fld: 9 % (ref 0–20)
Monocyte-Macrophage-Synovial Fluid: 66 % (ref 50–90)
Neutrophil, Synovial: 25 % (ref 0–25)
WBC, Synovial: 1488 /mm3 — ABNORMAL HIGH (ref 0–200)

## 2020-06-13 LAB — LACTIC ACID, PLASMA: Lactic Acid, Venous: 1.7 mmol/L (ref 0.5–1.9)

## 2020-06-13 LAB — SARS CORONAVIRUS 2 (TAT 6-24 HRS): SARS Coronavirus 2: NEGATIVE

## 2020-06-13 LAB — HIV ANTIBODY (ROUTINE TESTING W REFLEX): HIV Screen 4th Generation wRfx: NONREACTIVE

## 2020-06-13 LAB — GLUCOSE, CAPILLARY
Glucose-Capillary: 191 mg/dL — ABNORMAL HIGH (ref 70–99)
Glucose-Capillary: 205 mg/dL — ABNORMAL HIGH (ref 70–99)

## 2020-06-13 MED ORDER — ALBUTEROL SULFATE HFA 108 (90 BASE) MCG/ACT IN AERS
1.0000 | INHALATION_SPRAY | Freq: Four times a day (QID) | RESPIRATORY_TRACT | Status: DC | PRN
  Filled 2020-06-13: qty 6.7

## 2020-06-13 MED ORDER — ACETAMINOPHEN 325 MG PO TABS
650.0000 mg | ORAL_TABLET | Freq: Four times a day (QID) | ORAL | Status: DC | PRN
  Administered 2020-06-13 (×3): 650 mg via ORAL
  Filled 2020-06-13 (×3): qty 2

## 2020-06-13 MED ORDER — SERTRALINE HCL 50 MG PO TABS
150.0000 mg | ORAL_TABLET | Freq: Every day | ORAL | Status: DC
  Administered 2020-06-13 – 2020-06-14 (×2): 150 mg via ORAL
  Filled 2020-06-13 (×2): qty 1

## 2020-06-13 MED ORDER — SODIUM CHLORIDE 0.9 % IV SOLN
1.0000 g | INTRAVENOUS | Status: DC
  Administered 2020-06-14: 1 g via INTRAVENOUS
  Filled 2020-06-13: qty 10

## 2020-06-13 MED ORDER — ROPINIROLE HCL 1 MG PO TABS
1.0000 mg | ORAL_TABLET | Freq: Every day | ORAL | Status: DC
  Administered 2020-06-13: 1 mg via ORAL
  Filled 2020-06-13 (×2): qty 1
  Filled 2020-06-13: qty 2

## 2020-06-13 MED ORDER — ACETAMINOPHEN 650 MG RE SUPP: 650.0000 mg | Freq: Four times a day (QID) | RECTAL | Status: DC | PRN

## 2020-06-13 MED ORDER — INSULIN ASPART 100 UNIT/ML ~~LOC~~ SOLN
0.0000 [IU] | Freq: Three times a day (TID) | SUBCUTANEOUS | Status: DC
  Administered 2020-06-13: 3 [IU] via SUBCUTANEOUS
  Administered 2020-06-13: 5 [IU] via SUBCUTANEOUS
  Administered 2020-06-13: 3 [IU] via SUBCUTANEOUS

## 2020-06-13 MED ORDER — TETANUS-DIPHTH-ACELL PERTUSSIS 5-2.5-18.5 LF-MCG/0.5 IM SUSY
0.5000 mL | PREFILLED_SYRINGE | Freq: Once | INTRAMUSCULAR | Status: AC
  Administered 2020-06-13: 0.5 mL via INTRAMUSCULAR
  Filled 2020-06-13: qty 0.5

## 2020-06-13 MED ORDER — BUPROPION HCL ER (SR) 150 MG PO TB12
150.0000 mg | ORAL_TABLET | Freq: Two times a day (BID) | ORAL | Status: DC
  Administered 2020-06-13 (×2): 150 mg via ORAL
  Filled 2020-06-13 (×3): qty 1

## 2020-06-13 MED ORDER — SODIUM CHLORIDE 0.9 % IV SOLN
1.0000 g | Freq: Once | INTRAVENOUS | Status: AC
  Administered 2020-06-13: 1 g via INTRAVENOUS
  Filled 2020-06-13: qty 10

## 2020-06-13 MED ORDER — NICOTINE 7 MG/24HR TD PT24
7.0000 mg | MEDICATED_PATCH | Freq: Every day | TRANSDERMAL | Status: DC
  Administered 2020-06-13 – 2020-06-14 (×2): 7 mg via TRANSDERMAL
  Filled 2020-06-13 (×3): qty 1

## 2020-06-13 MED ORDER — LORATADINE 10 MG PO TABS
10.0000 mg | ORAL_TABLET | Freq: Every day | ORAL | Status: DC
  Administered 2020-06-13 – 2020-06-14 (×2): 10 mg via ORAL
  Filled 2020-06-13 (×2): qty 1

## 2020-06-13 MED ORDER — LEVOTHYROXINE SODIUM 75 MCG PO TABS
150.0000 ug | ORAL_TABLET | Freq: Every day | ORAL | Status: DC
  Administered 2020-06-13 – 2020-06-14 (×2): 150 ug via ORAL
  Filled 2020-06-13: qty 1
  Filled 2020-06-13: qty 2

## 2020-06-13 MED ORDER — PREDNISONE 5 MG PO TABS
5.0000 mg | ORAL_TABLET | Freq: Every day | ORAL | Status: DC
  Administered 2020-06-13: 5 mg via ORAL
  Filled 2020-06-13: qty 1

## 2020-06-13 MED ORDER — ENOXAPARIN SODIUM 40 MG/0.4ML ~~LOC~~ SOLN
40.0000 mg | SUBCUTANEOUS | Status: DC
  Administered 2020-06-13 – 2020-06-14 (×2): 40 mg via SUBCUTANEOUS
  Filled 2020-06-13 (×2): qty 0.4

## 2020-06-13 MED ORDER — ATORVASTATIN CALCIUM 80 MG PO TABS
80.0000 mg | ORAL_TABLET | Freq: Every day | ORAL | Status: DC
  Administered 2020-06-13 – 2020-06-14 (×2): 80 mg via ORAL
  Filled 2020-06-13 (×2): qty 1

## 2020-06-13 NOTE — ED Notes (Signed)
Attempted report 

## 2020-06-13 NOTE — Progress Notes (Signed)
Subjective:   This morning, patient states she hit her left knee on March 30th when she was climbing a rusted table. She notes her knee hit a rusty nail but only barely broke the skin. Since then, she developed bruising and increased popping of the knee. She continues to have some knee pain today with ambulation.  She notes intense urgency on the 30th or 31st of March, with moments of urinary incontinence, but it mostly resolved in 1 day. Then, on April 4th, she began to develop fever at home, with a temperature of 102. She continues to endorse mild urgency now.   Objective:  Vital signs in last 24 hours: Vitals:   06/13/20 0830 06/13/20 0941 06/13/20 1011 06/13/20 1231  BP: 101/73 103/73 (!) 115/57   Pulse: 98 91 87   Resp: (!) 24 14 19    Temp:  99.4 F (37.4 C) 99.4 F (37.4 C) 99.6 F (37.6 C)  TempSrc:  Oral Oral Axillary  SpO2: 93% 92% 96%   Weight:      Height:      On room air  Intake/Output Summary (Last 24 hours) at 06/13/2020 1448 Last data filed at 06/13/2020 0559 Gross per 24 hour  Intake 1100 ml  Output --  Net 1100 ml   Filed Weights   06/12/20 2029  Weight: 116.9 kg  Physical Exam Constitutional:      General: She is not in acute distress.    Appearance: She is not ill-appearing.  HENT:     Mouth/Throat:     Mouth: Mucous membranes are moist.     Pharynx: Oropharynx is clear.  Pulmonary:     Effort: Pulmonary effort is normal. No respiratory distress.  Abdominal:     Tenderness: There is no right CVA tenderness or left CVA tenderness.  Musculoskeletal:        General: Swelling and tenderness present.     Right lower leg: No edema.     Left lower leg: No edema.     Comments: Patient has mild swelling of the left knee with tenderness to palpation along the medial joint line. She has palpable crepitus overlying the knee with active and passive range of motion.  Skin:    Capillary Refill: Capillary refill takes less than 2 seconds.     Comments: Left  knee and surrounding soft tissue is mildly warmer than right knee. No overlying erythema. Patient has small puncture wound superolateral to the left knee.  Neurological:     General: No focal deficit present.     Mental Status: She is alert. Mental status is at baseline.  Psychiatric:        Mood and Affect: Mood normal.        Behavior: Behavior normal.        Thought Content: Thought content normal.        Judgment: Judgment normal.   Labs since admission: CBC Latest Ref Rng & Units 06/13/2020 06/12/2020 11/23/2019  WBC 4.0 - 10.5 K/uL 14.0(H) 14.8(H) 10.8(H)  Hemoglobin 12.0 - 15.0 g/dL 12.1 13.2 13.3  Hematocrit 36.0 - 46.0 % 37.1 40.9 41.2  Platelets 150 - 400 K/uL 175 225 270   BMP Latest Ref Rng & Units 06/13/2020 06/12/2020 11/10/2019  Glucose 70 - 99 mg/dL 186(H) 109(H) 102(H)  BUN 8 - 23 mg/dL 14 14 12   Creatinine 0.44 - 1.00 mg/dL 1.06(H) 1.18(H) 1.08(H)  BUN/Creat Ratio 12 - 28 - - -  Sodium 135 - 145 mmol/L 135 136 135  Potassium 3.5 - 5.1 mmol/L 4.2 3.8 3.8  Chloride 98 - 111 mmol/L 103 101 100  CO2 22 - 32 mmol/L 26 24 24   Calcium 8.9 - 10.3 mg/dL 8.8(L) 9.4 9.5   Glucose - 191, 160, 119 HIV - non reactive COVID - in process Urine culture (collected 06/13/20) - in process Blood cultures (collected 06/13/20) - in process  Imaging since admission: US RENAL Result Date: 06/13/2020 IMPRESSION: Normal renal ultrasound.  Assessment/Plan:  Active Problems:   UTI (urinary tract infection)  Kendra Adams. Hannen "Arrie Aran" is a 60 year old woman with past medical history significant for Crohn's disease (currently on prednisone taper for flare), osteoarthritis, type 2 diabetes mellitus, hypothyroidism, CAD (status post PCI in 2012), COPD, HTN and psoriasis who presented to [facility] on 06/13/2020 for evaluation of fever, left knee pain and urinary frequency/urgency found to have acute cystitis.  #Acute cystitis, active Patient's urinalysis consistent with urinary tract infection. Urine culture  has been collected and is currently pending. Patient has received one dose of IV ceftriaxone in the ED. Renal ultrasound does not reveal pyelonephritis and patient without CVA tenderness on examination. Plan to continue treating patient for urinary tract infection with transition to oral regimen upon stabilization. -Continue ceftriaxone, transition to oral regimen prior to discharge -Follow-up blood and urine cultures -Daily CBC  #Left knee traumatic injury, active #Osteoarthritis, chronic Patient continues to endorse ongoing left knee pain that is worse with ambulation. Workup thus far is not consistent with infectious arthritis. Patient's radiograph does reveal findings consistent with osteoarthritis. Patient would benefit from conservative management with consideration of further workup in the outpatient setting with MRI if symptoms fail to improve. -Acetaminophen 663m every six hours PRN  #Acute kidney injury, active Patient's baseline creatinine appears to be approximately 0.7 with creatinine of 1.06 on morning labs following 1L of IVF, therefore likely pre-renal in setting of decreased oral intake and urinary tract infection. -Monitor renal function on daily BMP -Strict intake and output  #Hypertension, chronic Blood pressure labile since admission. Currently holding home antihypertensive regimen. -Continue holding home metoprolol 1079mdaily, may restart -Continue holding home losartan 10078maily, may restart  #Crohn's disease, chronic Per chart review, patient should have approximately one week left of her prednisone taper and she has 2-3 tablets left at home (approximately six days of 5mg25m-Restart prednisone 5mg 65mly for one additional week of treatment  #Type 2 diabetes mellitus, chronic Patient's blood glucose levels maintained in 100s on sliding scale insulin since admission. -Moderate sliding scale insulin three times daily with meals  #Tobacco use disorder,  chronic -Continue home bupropion 150mg 83me daily -Continue nicotine 7mg da14m  #Hypothyroidism, chronic -Continue home levothyroxine 150mcg  100m, chronic Patient does not endorse chest pain or chest tightness at this time. -Continue home atorvastatin 80mg dai60m#COPD, chronic Patient is saturating at 100% on room air with no respiratory complaints at this time. -Continue home albuterol of 1-2 puffs every six hours PRN  #Restless leg syndrome, chronic -Continue home ropinirole 1mg night66m #Psoriasis, chronic Patient's psoriasis appears well-controlled at this time with only minimal cutaneous findings noted on examination. -Continue following with PCP to obtain financial assistance  #VTE ppx: Enoxaparin 40mg daily81mde status: Full code #IVF: None #Bowel regimen: Miralax 17g daily PRN #Diet: Heart healthy/carb modified  Prior to Admission Living Arrangement: Home Anticipated Discharge Location: Home Barriers to Discharge: Continued medical evaluation Dispo: Anticipated discharge 1-2 days  Asuna Peth, MaCato Mulligan22, 2:48 PM Pager: [phone] weekdays  and 1pm on weekends: On Call pager [phone]

## 2020-06-13 NOTE — ED Notes (Signed)
Hospitalist placed urine culture and wants to hold Rocephin until culture is collected.

## 2020-06-13 NOTE — Progress Notes (Signed)
   06/13/20 1449  Assess: MEWS Score  Temp (!) 102.4 F (39.1 C)  BP (!) 144/83  Pulse Rate 100  Resp 18  SpO2 96 %  Assess: MEWS Score  MEWS Temp 2  MEWS Systolic 0  MEWS Pulse 0  MEWS RR 0  MEWS LOC 0  MEWS Score 2  MEWS Score Color Yellow  Assess: if the MEWS score is Yellow or Red  Were vital signs taken at a resting state? Yes  Focused Assessment No change from prior assessment  Early Detection of Sepsis Score *See Row Information* High  MEWS guidelines implemented *See Row Information* Yes  Treat  MEWS Interventions Administered prn meds/treatments  Pain Scale 0-10  Pain Score 4  Pain Type Acute pain  Pain Location Head  Pain Orientation Anterior  Pain Descriptors / Indicators Headache  Pain Frequency Intermittent  Pain Onset Sudden  Patients Stated Pain Goal 2  Pain Intervention(s) Medication (See eMAR)  Take Vital Signs  Increase Vital Sign Frequency  Yellow: Q 2hr X 2 then Q 4hr X 2, if remains yellow, continue Q 4hrs  Escalate  MEWS: Escalate Yellow: discuss with charge nurse/RN and consider discussing with provider and RRT  Notify: Charge Nurse/RN  Name of Charge Nurse/RN Notified Marissa RN  Date Charge Nurse/RN Notified 06/13/20  Time Charge Nurse/RN Notified 1521

## 2020-06-13 NOTE — H&P (Addendum)
Date: 06/13/2020               Patient Name:  Kendra Adams MRN: [id number]  DOB: 12/18/1960 Age / Sex: 60 y.o., female   PCP: Kendra Ochs, MD         Medical Service: Internal Medicine Teaching Service         Attending Physician: Dr. [name], MD    First Contact: Dr. Wynetta Adams Pager: [phone]  Second Contact: Dr. Charleen Adams Pager: [phone]       After Hours (After 5p/  First Contact Pager: [phone]  weekends / holidays): Second Contact Pager: [phone]   Chief Complaint: fevers, chills  History of Present Illness:  Kendra Adams is 60yo cisfemale (she/her) with hx recent steroid use 2/2 Crohn's flare, psoriasis, T2DM, osteoarthritis, hypothyroidism, CAD s/p PCI 2012, COPD, HTN  presenting to [facility] with fevers, chills, and L knee pain.   Kendra Adams reports increased urinary urgency over this past weekend. She notes increased urinary frequency as well during this time. Mentions yesterday having fever of 103  along with chills and mild headache. Given her symptoms she went to the urgent care who subsequently sent her to the ED. Denies chest pain, dyspnea, nausea, vomiting, change in bowel habits. Of note, patient has history of E. Coli UTI (pansensitive), treated with ciprofloxacin.   Kendra Adams also reports L knee pain over the last two weeks that has acutely worsened. She describes helping her mother in law throw away a picnic table when she fell and hit her L knee on a nail. States she immediately used peroxide on the area. Since the incident, reports the pain has been tolerable. She was able to work on Monday, but the pain acutely worsened afterward. States she can no longer bear weight easily, but can bend the knee. Denies any pain prior to the incident, skin changes over the joint, or erythema. Denies any previous joint replacements or IVDU. Does mention it has been bruised, swollen, and is tender on the medial side of the knee. Of note, patient does not remember the last  time she received a tetanus shot.  Meds:  Albuterol 1-2 puffs q6h PRN Aspirin 29m qd Atorvastatin 861mqd Buproprion 15037md Cetirizine 70m60m Ferrous sulfate 325mg7mGlipizide 5mg q54mynthroid 150mg q42msartan 70mg qd79mformin 1000mg BID26moprolol 100mg qd N3mglycerin 0.4mg PRN Om90mazole 40mg qd Ser85mine 150mg qd  *Sh59ms been on prednisone taper since 3/17  Allergies: Allergies as of 06/12/2020 - Review Complete 06/12/2020  Allergen Reaction Noted  . Nsaids  09/25/2011  . Opium Nausea And Vomiting 05/13/2017  . Tramadol hcl  02/22/2010  . Gabapentin Swelling and Rash 07/25/2014   Past Medical History:  Diagnosis Date  . Allergic rhinitis   . Asthma   . Atopic dermatitis   . CAD (coronary artery disease)    a. 12/13/10: mRCA 99%, EF 65-70%;  s/p BMS to mRCA  . COPD (chronic obstructive pulmonary disease) (HCC)   . [place] Renovoion   . DM2 (diabetes mellitus, type 2) (HCC)   . [place]ER[place]esophageal reflux disease[place]   . GI bleed   . HTN (hypertension)   . Hyperlipidemia   . Hyperlipidemia   . Hypothyroidism   . Psoriasis   . Seasonal allergies   . Tobacco abuse    Family History:  Family History  Problem Relation Age of Onset  . Diabetes Mother   . 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 Patient has been smoking for "years" but is now down to three cigarettes daily with Wellbutrin. Denies alcohol use. Reports daily marijuana use since age 60.  Review of Systems: A complete ROS was negative except as per HPI.   Physical Exam: Blood pressure 132/64, pulse 95, temperature (!) 101.3 F (38.5 C), temperature source Oral, resp. rate 19, height 5' 3"  (1.6 m), weight 116.9 kg, SpO2 92 %. General: Obese, laying in bed in no acute distress. Non toxic-appearing, non-diaphoretic.  HENT: Normocephalic, atraumatic. Dry mucous membranes. CV: Tachycardic, regular rhythm. No murmurs, rubs, gallops  appreciated. Distal pulses 2+ bilaterally. Pulm: Normal work of breathing. Clear to auscultation bilaterally. No wheezing, rhonchi, rales. GI: Soft, non-tender, non-distended. Normoactive bowel sounds. MSK: L knee mildly swollen, medial>lateral. No erythema or drainage, small bruise medial side of knee. Slightly warm compared to other knee. No effusion appreciated. Anterior drawer, posterior drawer, varus stress, valgus stress tests all normal. Active and passive ROM normal. DIPs on bilateral hands enlarged, no tenderness to palpation, erythema, or warmth. No pitting edema bilaterally. Skin: No rashes on arms, legs appreciated. Skin warm, dry.  Neuro: Awake, alert, answering questions appropriately. Moving extremities appropriately. Psych: Normal mood, affect, speech.  CBC Latest Ref Rng & Units 06/12/2020 11/23/2019 11/10/2019  WBC 4.0 - 10.5 K/uL 14.8(H) 10.8(H) 22.9(H)  Hemoglobin 12.0 - 15.0 g/dL 13.2 13.3 14.4  Hematocrit 36.0 - 46.0 % 40.9 41.2 42.5  Platelets 150 - 400 K/uL 225 270 223   BMP Latest Ref Rng & Units 06/12/2020 11/10/2019 10/26/2019  Glucose 70 - 99 mg/dL 109(H) 102(H) 129(H)  BUN 8 - 23 mg/dL 14 12 13   Creatinine 0.44 - 1.00 mg/dL 1.18(H) 1.08(H) 0.77  BUN/Creat Ratio 12 - 28 - - 17  Sodium 135 - 145 mmol/L 136 135 138  Potassium 3.5 - 5.1 mmol/L 3.8 3.8 4.9  Chloride 98 - 111 mmol/L 101 100 100  CO2 22 - 32 mmol/L 24 24 25   Calcium 8.9 - 10.3 mg/dL 9.4 9.5 9.8   Urinalysis    Component Value Date/Time   COLORURINE YELLOW 06/12/2020 2031   APPEARANCEUR CLOUDY (A) 06/12/2020 2031   LABSPEC 1.019 06/12/2020 2031   PHURINE 5.0 06/12/2020 2031   GLUCOSEU NEGATIVE 06/12/2020 2031   GLUCOSEU NEG mg/dL 06/20/2008 2335   HGBUR SMALL (A) 06/12/2020 2031   BILIRUBINUR NEGATIVE 06/12/2020 2031   BILIRUBINUR Negative 12/02/2018 1016   KETONESUR NEGATIVE 06/12/2020 2031   PROTEINUR 30 (A) 06/12/2020 2031   UROBILINOGEN 0.2 11/10/2019 1739   NITRITE POSITIVE (A) 06/12/2020 2031    LEUKOCYTESUR LARGE (A) 06/12/2020 2031   Lactic 1.7  Synovial fluid studies: Appearance - Hazy Crystals - None WBC - 1488 (25% PMN, 9% lymphocytes, 66% monocyte-macrophage) Gram stain: Few WBC present, no organisms seen  L knee XR: Degenerative changes with joint effusion. No acute bony abnormality.  Assessment & Plan by Problem: Ms. Ricchio is 60yo cisfemale (she/her) with hx recent steroid use 2/2 Crohn's flair, psoriasis, T2DM, osteoarthritis, hypothyroidism, CAD s/p PCI 2012, COPD, HTN admitted 4/6 with urinary tract infection and acute monoarticular L knee pain, most likely non-inflammatory and 2/2 injury.  Active Problems:   UTI (urinary tract infection)  #Urinary tract infection #Hx previous UTIs, pyelonephritis Patient presenting with worsening urinary urgency and frequency associated with fevers and chills. On arrival to ED, patient febrile 103F, initially normotensive, and sating well on room air. While in ED, BP dropped 97/48 and was given 1L IVF  with adequate response. Initial lab work revealed leukocytosis (she has been on steroids since 3/17), normal lactic, elevated creatinine, and positive leukocytes, nitrites, and protein in urine. Presentation most consistent with urinary tract infection. BCx, UCx pending, patient started on Rocephin. Overall patient appears stable and likely will be able to transition to oral medications for discharge soon. - Given 1 dose Rocephin, likely can transition to oral abx - F/u BCx, UCx - F/u CBC, BMP  #L knee pain most likely 2/2 injury #Osteoarthritis Patient also presenting with L knee pain after a fall two weeks ago. 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  In ED, L knee with effusion present. XR without fractures, but noted degenerative changes and joint effusion. Given patient was  febrile, concern for possible septic joint therefore arthrocentesis performed (removed 26m) with synovial fluid studies collected. These revealed WBC 1500 w/ 25% PMNs, no organisms on gram stain, no crystals. Most consistent with non-inflammatory process, most likely due to injury. Exam did not reveal ligament tear, but can discuss pursuing MRI for further evaluation. Of note, she did receive Tdap in ED. - Acetaminophen 6537mq6h PRN - Can discuss L knee MRI  #Acute kidney injury On arrival, sCr 1.18 w/ GFR 53 (BL sCr 0.7-0.8). Most likely AKI pre-renal in setting of UTI. She does appear dry on exam. In ED, patient did receive 1L IVF. However, she was kept NPO for possible further procedures. Diet order placed, expect kidney function to improve with treatment of infection and increased po intake. - Daily BMP - I&O's - Avoid nephrotoxins   #Type 2 diabetes mellitus Most recent A1c 6.9 in March. She reports compliance with glipizide and metformin. Will hold in setting of acute infection and place on sliding scale while inpatient. - CBG monitoring QID - SSI  #Crohn's disease #Recent flare, currently on steroid taper Patient recently seen by PCP for Crohn's flare and placed on prednisone course with taper. Per chart review, patient should have on week left of medications, but reports only have two or three pills left. Given she is at the end of her course, will hold further steroids. Of note, patient was previously on Humira but has not taken since January given insurance issues. No acute changes in bowel habits, denies current abdominal pain. Will need to follow-up with GI outpatient to fill out paperwork. - F/u with outpatient GI for Humira paperwork.  #Psoriasis During patient's most recent visit to PCP, patient noted to have severe diffuse rash on bilateral arms, legs, and chest. She was treated concomitantly for Crohn's flare and psoriasis. She does not hold biopsy-proven diagnosis as she has  had trouble establishing with dermatology given uninsured status. She has also been referred to rheumatology in the past but has had similar issues. Of note, she is currently managing any dry skin with OTC creams. - F/u with PCP, will need further financial assistance  #Tobacco use disorder Mentions she has not had cigarette in 24 hours. States her Wellbutrin has helped. - Wellbutrin 15048m12h - Nicotine patch  #Hypothyroidism Last TSH 6.66 in March. Will continue home dose levothyroxine.  - C/w Synthroid 150m40m#CAD s/p PCI 2012 Patient with hx CAD s/p 2 BMS to RCA in 2012. Most recently underwent catheterization in September due to recurring chest pain. At that time found to have patent mid-RCA stent with minimal re-stenosis, mild non-obstructive disease  in circumflex, and normal LV function. Currently no complaints of chest pain or dyspnea and is HDS.  - C/w atorvastatin 620m qd, ASA 840mqd  #COPD No signs of COPD exacerbation on exam. Currently denies dyspnea or cough. Reports using albuterol as needed.  - Albuterol 1-2 puffs q6h PRN  #Hypertension BP's intermittently soft in ED given infection. Will hold home antihypertensives.  #Restless leg syndrome Continue with home ropinirole 20m68mhs  Dispo: Admit patient to Observation with expected length of stay less than 2 midnights.  Signed: BraSanjuan DameD 06/13/2020, 3:45 AM  Pager: [phone] 5pm on weekdays and 1pm on weekends: On Call pager: [phone]

## 2020-06-13 NOTE — ED Notes (Signed)
Consent signed with Tegeler, MD and pt, this RN as witness, consent to be scanned into medical records.

## 2020-06-13 NOTE — ED Notes (Signed)
Pt provided hygiene care at this time. Pt fresh personal brief applied, warm blanket given. Pt calling spouse to reenter room for update.

## 2020-06-13 NOTE — ED Notes (Signed)
Pt placed on purewick at this time, after 3 previous episodes of urinary incontinence. Pt reports being unable to control bladder when the urge to urinate is present. Pt provided hygiene care at this time, new brief provided.

## 2020-06-13 NOTE — Hospital Course (Signed)
Fever on Tuesday.  A few days before she was having urinary urgency and needed to rush to the bathroom. Wears a diaper. Denied any dysuria. Reports increased frequency. Then started having chills and fevers yesterday. Has a history of UTIs.  Some abdominal pain where she gets Humira shot.  Denies constipation. No changes in her bowel habits.   She was getting rid of old picnic table, fell, and hit a nail. Used peroxide.  Then she had pain with any weight. Was not having any pain before the fall.  Some warmth, no redness or drainage.   Worked on Monday, does lawn care for a living, when she was done working then the pain started.   Still needs to have re-certification for the Humira shots.   Hx of e coli UTI, treated with cipro, Pansensitive.   Lives in [place]. Smokes about 3 cigarettes per day for a few month, her Wellbutrin has been helping. Denies EtOH use or recreational drugs. Daily marijuana use, since age 1.

## 2020-06-13 NOTE — Plan of Care (Signed)

## 2020-06-14 DIAGNOSIS — M1712 Unilateral primary osteoarthritis, left knee: Secondary | ICD-10-CM

## 2020-06-14 DIAGNOSIS — I10 Essential (primary) hypertension: Secondary | ICD-10-CM

## 2020-06-14 DIAGNOSIS — M25562 Pain in left knee: Secondary | ICD-10-CM

## 2020-06-14 DIAGNOSIS — N3000 Acute cystitis without hematuria: Secondary | ICD-10-CM

## 2020-06-14 DIAGNOSIS — N390 Urinary tract infection, site not specified: Secondary | ICD-10-CM

## 2020-06-14 LAB — CBC
HCT: 36.5 % (ref 36.0–46.0)
Hemoglobin: 12.2 g/dL (ref 12.0–15.0)
MCH: 31.2 pg (ref 26.0–34.0)
MCHC: 33.4 g/dL (ref 30.0–36.0)
MCV: 93.4 fL (ref 80.0–100.0)
Platelets: 174 10*3/uL (ref 150–400)
RBC: 3.91 MIL/uL (ref 3.87–5.11)
RDW: 13.7 % (ref 11.5–15.5)
WBC: 11.3 10*3/uL — ABNORMAL HIGH (ref 4.0–10.5)
nRBC: 0 % (ref 0.0–0.2)

## 2020-06-14 LAB — BASIC METABOLIC PANEL
Anion gap: 9 (ref 5–15)
BUN: 11 mg/dL (ref 8–23)
CO2: 26 mmol/L (ref 22–32)
Calcium: 8.9 mg/dL (ref 8.9–10.3)
Chloride: 102 mmol/L (ref 98–111)
Creatinine, Ser: 0.93 mg/dL (ref 0.44–1.00)
GFR, Estimated: >60 mL/min (ref >60)
Glucose, Bld: 172 mg/dL — ABNORMAL HIGH (ref 70–99)
Potassium: 4.1 mmol/L (ref 3.5–5.1)
Sodium: 137 mmol/L (ref 135–145)

## 2020-06-14 LAB — GLUCOSE, CAPILLARY
Glucose-Capillary: 207 mg/dL — ABNORMAL HIGH (ref 70–99)
Glucose-Capillary: 194 mg/dL — ABNORMAL HIGH (ref 70–99)

## 2020-06-14 LAB — GLUCOSE, BODY FLUID OTHER

## 2020-06-14 MED ORDER — METOPROLOL SUCCINATE ER 50 MG PO TB24
100.0000 mg | ORAL_TABLET | Freq: Every day | ORAL | Status: DC
  Administered 2020-06-14: 100 mg via ORAL
  Filled 2020-06-14: qty 2

## 2020-06-14 MED ORDER — ACETAMINOPHEN 325 MG PO TABS
650.0000 mg | ORAL_TABLET | Freq: Four times a day (QID) | ORAL | 0 refills | Status: AC | PRN
Start: 2020-06-14 — End: ?

## 2020-06-14 MED ORDER — CEFDINIR 300 MG PO CAPS: 300.0000 mg | ORAL_CAPSULE | Freq: Two times a day (BID) | ORAL | 0 refills | Status: DC

## 2020-06-14 MED ORDER — CEFDINIR 300 MG PO CAPS: 300.0000 mg | ORAL_CAPSULE | Freq: Two times a day (BID) | ORAL | Status: DC

## 2020-06-14 MED ORDER — INSULIN ASPART 100 UNIT/ML ~~LOC~~ SOLN
0.0000 [IU] | Freq: Three times a day (TID) | SUBCUTANEOUS | Status: DC
  Administered 2020-06-14: 7 [IU] via SUBCUTANEOUS

## 2020-06-14 NOTE — Progress Notes (Signed)
Patient received AVS discharge paperwork with education provided by Primary RN including changes to medications and follow-up appointments with patient voicing understanding of material and no questions or concerns voiced at time of discharge.  Patient PIV removed prior to discharge with patient transported off the unit by Primary RN downstairs to patient's husband who will be providing transport for patient home.

## 2020-06-14 NOTE — Progress Notes (Signed)
Subjective:   Overnight, Tmax of 100.3 and received one dose of acetaminophen.  This morning, patient reports that she is doing well. She states that her urinary urgency has improved since admission. She is eating and drinking well without nausea or significant abdominal discomfort. She reports that her left knee feels fine while in bed with full range of motion, however she has discomfort with weight bearing that affects her gait somewhat. She is very excited to likely discharge home today after physical therapy evaluation.  Objective:  Vital signs in last 24 hours: Vitals:   06/13/20 1711 06/13/20 2030 06/14/20 0500 06/14/20 0552  BP: (!) 132/98 98/73  (!) 124/59  Pulse: 98 92  (!) 103  Resp: 18 18  20   Temp: 100.2 F (37.9 C) 100.3 F (37.9 C)  100 F (37.8 C)  TempSrc: Oral Oral    SpO2: 93% 96%  94%  Weight:   116.9 kg   Height:      On room air  Physical Exam Constitutional:      General: She is not in acute distress.    Appearance: She is not ill-appearing.  Pulmonary:     Effort: Pulmonary effort is normal. No respiratory distress.  Abdominal:     General: Abdomen is flat. Bowel sounds are normal.     Palpations: Abdomen is soft.     Comments: Minimal tenderness of left lower quadrant  Musculoskeletal:        General: Tenderness present. No swelling.     Right lower leg: No edema.     Left lower leg: No edema.     Comments: Patient has mild swelling of the left knee with tenderness to palpation along the medial joint line. She has palpable crepitus overlying the knee with active and passive range of motion.  Skin:    Comments: No overlying erythema of the left knee. No increased warmth when compared to the right knee.  Neurological:     General: No focal deficit present.     Mental Status: She is alert. Mental status is at baseline.  Psychiatric:        Mood and Affect: Mood normal.        Behavior: Behavior normal.        Thought Content: Thought content  normal.        Judgment: Judgment normal.    Labs in last 24 hours: CBC Latest Ref Rng & Units 06/14/2020 06/13/2020 06/12/2020  WBC 4.0 - 10.5 K/uL 11.3(H) 14.0(H) 14.8(H)  Hemoglobin 12.0 - 15.0 g/dL 12.2 12.1 13.2  Hematocrit 36.0 - 46.0 % 36.5 37.1 40.9  Platelets 150 - 400 K/uL 174 175 225   BMP Latest Ref Rng & Units 06/14/2020 06/13/2020 06/12/2020  Glucose 70 - 99 mg/dL 172(H) 186(H) 109(H)  BUN 8 - 23 mg/dL 11 14 14   Creatinine 0.44 - 1.00 mg/dL 0.93 1.06(H) 1.18(H)  BUN/Creat Ratio 12 - 28 - - -  Sodium 135 - 145 mmol/L 137 135 136  Potassium 3.5 - 5.1 mmol/L 4.1 4.2 3.8  Chloride 98 - 111 mmol/L 102 103 101  CO2 22 - 32 mmol/L 26 26 24   Calcium 8.9 - 10.3 mg/dL 8.9 8.8(L) 9.4   Glucose - 205, 191, 160, 119 Urine culture (collected 06/13/20) - sent Blood cultures (collected 06/13/20) - No growth <24 hours Body fluid from knee: Culture pending  Imaging in last 24 hours: No new imaging  Assessment/Plan:  Active Problems:   UTI (urinary tract infection)  Kendra Berry. Yepez "Arrie Aran" is a 60 year old woman with past medical history significant for Crohn's disease (currently on prednisone taper for flare), osteoarthritis, type 2 diabetes mellitus, hypothyroidism, CAD, COPD, HTN and psoriasis who presented to [facility] on 06/13/2020 for evaluation of fever, left knee pain and urinary frequency/urgency found to have acute complicated cystitis.  #Acute complicated cystitis, active Patient continues to have low-grade fevers in the setting of acetaminophen use over the past 24 hours, although improved from admission. Patient's white blood cell count mildly elevated although in the setting of ongoing low-dose prednisone use. No other signs/symptoms to suggest alternative source of infection. Blood cultures have resulted with no growth in less than 24 hours although urine culture is still pending. -Continue ceftriaxone (day 2), transition to oral cefdinir upon discharge for additional five days of  treatment -Continue to follow blood and urine cultures  #Acute kidney injury, active Patient's baseline creatinine appears to be approximately 0.7. Patient's creatinine improving gradually to 0.9 today. Likely secondary to complication of infection as described above as well as decreased oral intake. -Continue to avoid nephrotoxins -Treat infection as above -Follow-up BMP in hospital follow-up  #Left knee traumatic injury, active #Osteoarthritis, chronic Patient continues to endorse ongoing left knee pain that is present with ambulation. Workup thus far is not consistent with infectious arthritis. Patient's radiograph does reveal findings consistent with osteoarthritis. Patient would benefit from conservative management with consideration of further workup in the outpatient setting with MRI if symptoms fail to improve. We will request physical therapy evaluation prior to discharge. -Acetaminophen 653m every six hours PRN -Physical therapy evaluation today, appreciate recommendations  #Hypertension, chronic Blood pressure labile since admission. Originally holding home metoprolol and losartan since admission. As patient has been mildly tachycardic since admission, will restart home metoprolol with continued holding of home losartan. -Restart home metoprolol 1073mdaily -Continue holding home losartan 10016maily, re-evaluate at hospital follow-up  #Crohn's disease, chronic Patient should have completed prednisone taper as of today. We will discontinue further systemic corticosteroids in the setting of her diabetes. -Discontinue prednisone 5mg63mily  #Type 2 diabetes mellitus, chronic Patient's blood glucose levels trending up gradually since admission. Patient would benefit from escalating the sensitivity of her sliding scale insulin while admitted with plan to transition to home regimen upon discharge. -Increase sliding scale insulin to resistant TID AC and QHS -Continue carb  modified/heart healthy diet  #Tobacco use disorder, chronic -Continue home bupropion 150mg53mce daily -Continue nicotine 7mg d48my  #Hypothyroidism, chronic -Continue home levothyroxine 150mcg 2mD, chronic -Continue home atorvastatin 80mg da26m #COPD, chronic Patient is saturating well on room air with no respiratory complaints at this time. -Continue home albuterol of 1-2 puffs every six hours PRN  #Restless leg syndrome, chronic -Continue home ropinirole 1mg nigh34m  #Psoriasis, chronic Patient's psoriasis appears well-controlled at this time with only minimal cutaneous findings noted on examination. -Continue following with PCP to obtain financial assistance  #VTE ppx: Enoxaparin 40mg dail15mode status: Full code #IVF: None #Bowel regimen: Miralax 17g daily PRN #Diet: Heart healthy/carb modified  Prior to Admission Living Arrangement: Home Anticipated Discharge Location: Home Barriers to Discharge: Continued medical evaluation Dispo: Anticipated discharge 0-1 days  Javien Tesch, MCato Mulligan022, 9:47 AM Pager: [phone] on weekdays and 1pm on weekends: On Call pager [phone]

## 2020-06-14 NOTE — Discharge Summary (Signed)
Name: Kendra Adams MRN: [id number] DOB: May 31, 1960 60 y.o. PCP: [name], MD  Date of Admission: 06/12/2020  8:25 PM Date of Discharge: 06/14/2020 Attending Physician: [name], MD  Discharge Diagnosis: 1. Active Problems:   UTI (urinary tract infection)  Discharge Medications: Allergies as of 06/14/2020      Reactions   Nsaids    IBU - Rectal bleeds; states ASA & Tylenol OK   Opium Nausea And Vomiting   Tramadol Hcl    "Just doesn't do anything for me"   Gabapentin Swelling, Rash   Skin rash, lip swelling      Medication List    STOP taking these medications   fluconazole 150 MG tablet Commonly known as: DIFLUCAN   Humira Pen 80 MG/0.8ML Pnkt Generic drug: Adalimumab   loratadine 10 MG tablet Commonly known as: CLARITIN   predniSONE 10 MG tablet Commonly known as: DELTASONE   triamcinolone cream 0.1 % Commonly known as: KENALOG     TAKE these medications   Accu-Chek Guide test strip Generic drug: glucose blood USE  STRIP TO CHECK GLUCOSE ONCE DAILY   Accu-Chek Softclix Lancets lancets USE   TO CHECK GLUCOSE ONCE DAILY   acetaminophen 325 MG tablet Commonly known as: TYLENOL Take 2 tablets (650 mg total) by mouth every 6 (six) hours as needed for mild pain (or Fever >/= 101).   AeroChamber Plus inhaler Use as instructed   albuterol 108 (90 Base) MCG/ACT inhaler Commonly known as: VENTOLIN HFA Inhale 1-2 puffs into the lungs every 6 (six) hours as needed for wheezing or shortness of breath.   aspirin 81 MG chewable tablet Chew 81 mg by mouth every morning.   atorvastatin 80 MG tablet Commonly known as: LIPITOR Take 1 tablet (80 mg total) by mouth daily.   betamethasone dipropionate 0.05 % cream Apply topically 2 (two) times daily. What changed:   how much to take  when to take this  reasons to take this   buPROPion 150 MG 12 hr tablet Commonly known as: WELLBUTRIN SR Take 1 tablet (150 mg total) by mouth 2 (two)  times daily.   cefdinir 300 MG capsule Commonly known as: OMNICEF Take 1 capsule (300 mg total) by mouth every 12 (twelve) hours. Start taking on: June 15, 2020   cetirizine 10 MG tablet Commonly known as: EQ Allergy Relief (Cetirizine) Take 1 tablet (10 mg total) by mouth daily.   ferrous sulfate 325 (65 FE) MG EC tablet Take 1 tablet (325 mg total) by mouth daily with breakfast.   fluticasone 50 MCG/ACT nasal spray Commonly known as: Flonase Place 2 sprays into both nostrils daily.   glipiZIDE 5 MG tablet Commonly known as: GLUCOTROL Take 1 tablet (5 mg total) by mouth daily.   levothyroxine 150 MCG tablet Commonly known as: SYNTHROID Take 1 tablet (150 mcg total) by mouth daily before breakfast.   losartan 100 MG tablet Commonly known as: COZAAR Take 1 tablet (100 mg total) by mouth at bedtime.   metFORMIN 1000 MG tablet Commonly known as: GLUCOPHAGE Take 1 tablet (1,000 mg total) by mouth 2 (two) times daily.   metoprolol succinate 100 MG 24 hr tablet Commonly known as: TOPROL-XL TAKE 1 TABLET BY MOUTH ONCE DAILY WITH  OR  IMMEDIATELY  FOLLOWING  A  MEAL What changed: See the new instructions.   nitroGLYCERIN 0.4 MG SL tablet Commonly known as: NITROSTAT Place 1 tablet (0.4 mg total) under the tongue every 5 (five) minutes as needed for  chest pain.   omeprazole 40 MG capsule Commonly known as: PRILOSEC Take 1 capsule (40 mg total) by mouth daily.   ondansetron 4 MG tablet Commonly known as: ZOFRAN Take 1 tablet (4 mg total) by mouth every 8 (eight) hours as needed for nausea or vomiting.   rOPINIRole 1 MG tablet Commonly known as: REQUIP TAKE 1 TABLET BY MOUTH AT BEDTIME   sertraline 100 MG tablet Commonly known as: ZOLOFT Take 1.5 tablets (150 mg total) by mouth daily.      Disposition and follow-up:   Ms.[name] was discharged from [facility] in Good condition.  At the hospital follow up visit please address:  1.  Acute  complicated cystitis 2/2 E. Coli: Patient hospitalized for urinary tract infection resulting in high fevers (104) and acute kidney injury. Patient received two days of IV ceftriaxone and transitioned to oral cefdinir for an additional five days of treatment. Please assess status of patient's symptoms and adherence to antibiotic regimen. Urine culture susceptibilities pending prior to discharge.  Hypertension: Patient has history of hypertension for which she was previously prescribed losartan and metoprolol. Patient initially had relatively softer blood pressures throughout admission. Assess for symptoms suggestive of symptomatic hypotension at hospital follow-up.  AKI: Patient noted to have mild acute kidney injury with creatinine elevated to 1.2 with baseline of approximately 0.7. Please consider obtaining a basic metabolic panel to evaluate renal function.  2.  Labs / imaging needed at time of follow-up: Consider obtaining CBC and BMP  3.  Pending labs/ test needing follow-up: Urine culture susceptibilities, blood culture, body fluid culture (left knee aspiration)  Follow-up Appointments:  Follow-up Information    [facility]. Schedule an appointment as soon as possible for a visit in 1 week(s).   Contact information: [address] [place] [facility] Course by problem list:  #Acute complicated cystitis, active Patient admitted following several days of urinary frequency and urgency found to have urinalysis consistent with urinary tract infection. Patient started on IV ceftriaxone of which she received two doses. Patient noted to have leukocytosis and frequent high fevers. She underwent renal ultrasound which was unremarkable for pyelonephritis. Patient transitioned to oral cefdinir with plan to complete additional five day course of treatment. Patient's urine culture resulted >100,000 E. Coli prior to discharge  with susceptibilities pending.  #Acute kidney injury, active Patient's baseline creatinine appears to be approximately 0.7, however creatinine was 1.2 on admission. Patient's creatinine improved gradually to 0.9 today. Acute kidney injury attributed to be secondary to complication of infection as described above as well as decreased oral intake.  #Left knee traumatic injury, active #Osteoarthritis, chronic Patient presented with ongoing left knee pain that was initially concerning for septic arthritis. Patient received radiograph of left knee which was remarkable for osteoarthritis and joint aspiration which revealed only approximately 1500 WBCs. Patient's knee pain was attributed to osteoarthritis and recent injury as septic arthritis was ruled. Patient did receive tetanus shot for exposure to rusty nail. If patient's symptoms don't improve, plan was to have patient receive further evaluation and imaging in outpatient setting.  #Hypertension, chronic Patient's blood pressure was relatively labile throughout admission. Patient's home metoprolol and losartan originally held, however she was restarted prior to discharge.  #Crohn's disease, chronic Patient had been on a prednisone taper prior to arrival with approximately 2-3 days left in treatment. Patient received 28m of  prednisone on day of admission, however this medication was discontinued prior to discharge.  Pertinent Labs, Studies, and Procedures:  CBC Latest Ref Rng & Units 06/14/2020 06/13/2020 06/12/2020  WBC 4.0 - 10.5 K/uL 11.3(H) 14.0(H) 14.8(H)  Hemoglobin 12.0 - 15.0 g/dL 12.2 12.1 13.2  Hematocrit 36.0 - 46.0 % 36.5 37.1 40.9  Platelets 150 - 400 K/uL 174 175 225   CMP Latest Ref Rng & Units 06/14/2020 06/13/2020 06/12/2020  Glucose 70 - 99 mg/dL 172(H) 186(H) 109(H)  BUN 8 - 23 mg/dL 11 14 14   Creatinine 0.44 - 1.00 mg/dL 0.93 1.06(H) 1.18(H)  Sodium 135 - 145 mmol/L 137 135 136  Potassium 3.5 - 5.1 mmol/L 4.1 4.2 3.8  Chloride 98  - 111 mmol/L 102 103 101  CO2 22 - 32 mmol/L 26 26 24   Calcium 8.9 - 10.3 mg/dL 8.9 8.8(L) 9.4  Total Protein 6.5 - 8.1 g/dL - - -  Total Bilirubin 0.3 - 1.2 mg/dL - - -  Alkaline Phos 38 - 126 U/L - - -  AST 15 - 41 U/L - - -  ALT 0 - 44 U/L - - -   Glucose - 205, 191, 160, 119 Urine culture (collected 06/13/20) - >100,000 colonies of E. Coli (susceptibilities pending) Blood cultures (collected 06/13/20) - No growth <24 hours Body fluid from knee: No growth in 1 day  US RENAL  Result Date: 06/13/2020 CLINICAL DATA:  Urinary tract infection EXAM: RENAL / URINARY TRACT ULTRASOUND COMPLETE COMPARISON:  None. FINDINGS: Right Kidney: Renal measurements: 11.1 x 5.2 x 6.1 cm = volume: 199 mL. Echogenicity within normal limits. No mass or hydronephrosis visualized. Left Kidney: Renal measurements: 10.1 x 7.0 x 6.0 = volume: 222 mL. Echogenicity within normal limits. No mass or hydronephrosis visualized. Bladder: Appears normal for degree of bladder distention. Other: None. IMPRESSION: Normal renal ultrasound. Electronically Signed   By: Jacqulynn Cadet M.D.   On: 06/13/2020 11:35   DG Knee Complete 4 Views Left  Result Date: 06/12/2020 CLINICAL DATA:  Fall 1 week ago.  Pain EXAM: LEFT KNEE - COMPLETE 4+ VIEW COMPARISON:  None. FINDINGS: Moderate tricompartment degenerative changes. Small joint effusion. No acute bony abnormality. Specifically, no fracture, subluxation, or dislocation. IMPRESSION: Degenerative changes with joint effusion. No acute bony abnormality. Electronically Signed   By: Rolm Baptise M.D.   On: 06/12/2020 22:37   Discharge Instructions: Discharge Instructions    Call MD for:  extreme fatigue   Complete by: As directed    Call MD for:  persistant dizziness or light-headedness   Complete by: As directed    Call MD for:  persistant nausea and vomiting   Complete by: As directed    Diet - low sodium heart healthy   Complete by: As directed    Discharge instructions    Complete by: As directed    Ms. Abdella,  It was an absolute pleasure having the opportunity to help take care of you during your recent hospitalization.  You presented to the emergency department for evaluation of fevers, knee pain and some urine frequency and urgency. Your workup revealed that you had a urinary tract infection which we treated with IV antibiotics in the hospital. Once you leave the hospital, please pick up a new prescription for an oral antibiotic called cefdinir. I ask that you take this medication twice a day for the next five days starting tomorrow morning. Also, I ask that you stop taking the prednisone that you have at home. You can  continue taking all of your other medications as previously prescribed. We would like for you to have a hospital follow-up appointment within one week of discharge to evaluate your symptoms and recovery from infection. If you have any questions or concerns, please do not hesitate to contact our clinic.  Sincerely, Dr. Paulla Dolly, MD   Increase activity slowly   Complete by: As directed      Signed: Cato Mulligan, MD 06/14/2020, 1:42 PM   Pager: [phone]

## 2020-06-14 NOTE — Evaluation (Signed)
Physical Therapy Evaluation Patient Details Name: Kendra Adams MRN: [id number] DOB: 1961-01-18 Today's Date: 06/14/2020   History of Present Illness  Pt is a 60 y/o female admitted 4/5 after injuring her L knee when loading things into a truck. Imaging negative for acute abnormality. Pt also found to have UTI. PMH includes Crohn's disease,  psoriasis, T2DM, osteoarthritis, hypothyroidism, CAD s/p PCI 2012, COPD, HTN  Clinical Impression  Pt admitted secondary to problem above with deficits below. Pt requiring min guard A for mobility tasks using RW. Initially guarded secondary to pain in L knee, but improved with increased distance. Educated about using RW at home to increase safety. Feel pt will progress well and will not require follow up PT. Will continue to follow acutely.     Follow Up Recommendations No PT follow up    Equipment Recommendations  None recommended by PT    Recommendations for Other Services       Precautions / Restrictions Precautions Precautions: Fall Restrictions Weight Bearing Restrictions: No      Mobility  Bed Mobility Overal bed mobility: Modified Independent                  Transfers Overall transfer level: Needs assistance Equipment used: Rolling walker (2 wheeled) Transfers: Sit to/from Stand Sit to Stand: Min guard         General transfer comment: Min guard for safety. Cues for safe hand placement. Limited weightshift on LLE initially.  Ambulation/Gait Ambulation/Gait assistance: Min guard Gait Distance (Feet): 120 Feet Assistive device: Rolling walker (2 wheeled) Gait Pattern/deviations: Step-to pattern;Step-through pattern;Decreased weight shift to left Gait velocity: Decreased   General Gait Details: Mildly antalgic gait initially, however, pt able to put increased weight on LLE as gait progressed. Min guard for safety. Educated about using RW at home to increase safety.  Stairs Stairs: Yes Stairs assistance: Min  guard Stair Management: One rail Right;Step to pattern;Forwards Number of Stairs: 3 General stair comments: Educated about LE sequencing, however, pt choosing to ascend steps with LLE. Pt reporting thats the way she does it at home. Educated on "up with the good, down with the bad" extremity to help with pain management.  Wheelchair Mobility    Modified Rankin (Stroke Patients Only)       Balance Overall balance assessment: Mild deficits observed, not formally tested                                           Pertinent Vitals/Pain Pain Assessment: Faces Faces Pain Scale: Hurts a little bit Pain Location: L knee Pain Descriptors / Indicators: Aching Pain Intervention(s): Limited activity within patient's tolerance;Monitored during session;Repositioned    Home Living Family/patient expects to be discharged to:: Private residence Living Arrangements: Spouse/significant other Available Help at Discharge: Family Type of Home: House Home Access: Stairs to enter Entrance Stairs-Rails: Right;Left;Can reach both Entrance Stairs-Number of Steps: 4 Home Layout: Two level;Able to live on main level with bedroom/bathroom Home Equipment: Gilford Rile - 2 wheels;Shower seat;Wheelchair - manual      Prior Function Level of Independence: Independent         Comments: Working in Copy        Extremity/Trunk Assessment   Upper Extremity Assessment Upper Extremity Assessment: Overall WFL for tasks assessed    Lower Extremity Assessment Lower Extremity Assessment: LLE deficits/detail  LLE Deficits / Details: Reports increased pain when weightbearing. Otherwise ROM WFL.    Cervical / Trunk Assessment Cervical / Trunk Assessment: Normal  Communication   Communication: No difficulties  Cognition Arousal/Alertness: Awake/alert Behavior During Therapy: WFL for tasks assessed/performed Overall Cognitive Status: Within Functional Limits  for tasks assessed                                        General Comments      Exercises     Assessment/Plan    PT Assessment Patient needs continued PT services  PT Problem List Decreased strength;Decreased activity tolerance;Decreased balance;Decreased mobility;Decreased knowledge of use of DME;Decreased knowledge of precautions;Pain       PT Treatment Interventions DME instruction;Gait training;Functional mobility training;Stair training;Therapeutic activities;Therapeutic exercise;Balance training;Patient/family education    PT Goals (Current goals can be found in the Care Plan section)  Acute Rehab PT Goals Patient Stated Goal: to go home PT Goal Formulation: With patient Time For Goal Achievement: 06/28/20 Potential to Achieve Goals: Good    Frequency Min 3X/week   Barriers to discharge        Co-evaluation               AM-PAC PT "6 Clicks" Mobility  Outcome Measure Help needed turning from your back to your side while in a flat bed without using bedrails?: None Help needed moving from lying on your back to sitting on the side of a flat bed without using bedrails?: None Help needed moving to and from a bed to a chair (including a wheelchair)?: A Little Help needed standing up from a chair using your arms (e.g., wheelchair or bedside chair)?: A Little Help needed to walk in hospital room?: A Little Help needed climbing 3-5 steps with a railing? : A Little 6 Click Score: 20    End of Session Equipment Utilized During Treatment: Gait belt Activity Tolerance: Patient tolerated treatment well Patient left: in bed;with call bell/phone within reach;with bed alarm set Nurse Communication: Mobility status PT Visit Diagnosis: Other abnormalities of gait and mobility (R26.89);Pain Pain - Right/Left: Left Pain - part of body: Knee    Time: 8469-6295 PT Time Calculation (min) (ACUTE ONLY): 16 min   Charges:   PT Evaluation $PT Eval Low  Complexity: 1 Low          Lou Miner, DPT  Acute Rehabilitation Services  Pager: [phone] Office: [phone]   Rudean Hitt 06/14/2020, 11:27 AM

## 2020-06-15 ENCOUNTER — Telehealth: Payer: Self-pay

## 2020-06-15 LAB — URINE CULTURE: Culture: >=100000 — AB

## 2020-06-15 NOTE — Telephone Encounter (Signed)
Transition Care Management Unsuccessful Follow-up Telephone Call  Date of discharge and from where:  06/14/2020 from [facility]  Attempts:  1st Attempt  Reason for unsuccessful TCM follow-up call:  Left voice message

## 2020-06-16 LAB — BODY FLUID CULTURE W GRAM STAIN: Culture: NO GROWTH

## 2020-06-18 LAB — CULTURE, BLOOD (ROUTINE X 2)
Culture: NO GROWTH
Culture: NO GROWTH

## 2020-06-18 NOTE — Telephone Encounter (Signed)
Transition Care Management Unsuccessful Follow-up Telephone Call  Date of discharge and from where:  06/14/2020 from [facility]  Attempts:  2nd Attempt  Reason for unsuccessful TCM follow-up call:  Left voice message

## 2020-06-19 NOTE — Telephone Encounter (Signed)
Transition Care Management Unsuccessful Follow-up Telephone Call  Date of discharge and from where:  06/14/2020 from [facility] Attempts:  3rd Attempt  Reason for unsuccessful TCM follow-up call:  Unable to reach patient

## 2020-07-11 ENCOUNTER — Encounter: Payer: Self-pay | Admitting: Internal Medicine

## 2020-07-21 ENCOUNTER — Other Ambulatory Visit: Payer: Self-pay | Admitting: Internal Medicine

## 2020-07-21 DIAGNOSIS — F17200 Nicotine dependence, unspecified, uncomplicated: Secondary | ICD-10-CM

## 2020-07-21 DIAGNOSIS — K219 Gastro-esophageal reflux disease without esophagitis: Secondary | ICD-10-CM

## 2020-07-23 ENCOUNTER — Other Ambulatory Visit: Payer: Self-pay | Admitting: *Deleted

## 2020-07-23 MED ORDER — CETIRIZINE HCL 10 MG PO TABS: 10.0000 mg | ORAL_TABLET | Freq: Every day | ORAL | 2 refills | Status: DC

## 2020-07-23 NOTE — Telephone Encounter (Signed)
Next appt scheduled 6/8 with PCP.

## 2020-07-23 NOTE — Telephone Encounter (Signed)
Approved refill request for cetirizine. Please have patient schedule a hospital follow up appointment. Thank you.

## 2020-08-11 ENCOUNTER — Other Ambulatory Visit: Payer: Self-pay | Admitting: Internal Medicine

## 2020-08-11 DIAGNOSIS — I10 Essential (primary) hypertension: Secondary | ICD-10-CM

## 2020-08-15 ENCOUNTER — Encounter: Payer: Self-pay | Admitting: *Deleted

## 2020-08-15 ENCOUNTER — Other Ambulatory Visit: Payer: Self-pay

## 2020-08-15 ENCOUNTER — Encounter: Payer: Self-pay | Admitting: Internal Medicine

## 2020-08-15 ENCOUNTER — Ambulatory Visit (INDEPENDENT_AMBULATORY_CARE_PROVIDER_SITE_OTHER): Payer: Self-pay | Admitting: Internal Medicine

## 2020-08-15 VITALS — BP 120/82 | HR 92 | Temp 98.9°F | Ht 63.0 in | Wt 248.2 lb

## 2020-08-15 DIAGNOSIS — I152 Hypertension secondary to endocrine disorders: Secondary | ICD-10-CM

## 2020-08-15 DIAGNOSIS — E1159 Type 2 diabetes mellitus with other circulatory complications: Secondary | ICD-10-CM

## 2020-08-15 DIAGNOSIS — Z87440 Personal history of urinary (tract) infections: Secondary | ICD-10-CM

## 2020-08-15 DIAGNOSIS — K50919 Crohn's disease, unspecified, with unspecified complications: Secondary | ICD-10-CM

## 2020-08-15 DIAGNOSIS — L409 Psoriasis, unspecified: Secondary | ICD-10-CM

## 2020-08-15 DIAGNOSIS — F32A Depression, unspecified: Secondary | ICD-10-CM

## 2020-08-15 DIAGNOSIS — F17200 Nicotine dependence, unspecified, uncomplicated: Secondary | ICD-10-CM

## 2020-08-15 DIAGNOSIS — I10 Essential (primary) hypertension: Secondary | ICD-10-CM

## 2020-08-15 DIAGNOSIS — Z1231 Encounter for screening mammogram for malignant neoplasm of breast: Secondary | ICD-10-CM

## 2020-08-15 DIAGNOSIS — Z8669 Personal history of other diseases of the nervous system and sense organs: Secondary | ICD-10-CM

## 2020-08-15 DIAGNOSIS — E785 Hyperlipidemia, unspecified: Secondary | ICD-10-CM

## 2020-08-15 DIAGNOSIS — K50114 Crohn's disease of large intestine with abscess: Secondary | ICD-10-CM

## 2020-08-15 DIAGNOSIS — K219 Gastro-esophageal reflux disease without esophagitis: Secondary | ICD-10-CM

## 2020-08-15 DIAGNOSIS — E038 Other specified hypothyroidism: Secondary | ICD-10-CM

## 2020-08-15 DIAGNOSIS — E118 Type 2 diabetes mellitus with unspecified complications: Secondary | ICD-10-CM

## 2020-08-15 DIAGNOSIS — M1712 Unilateral primary osteoarthritis, left knee: Secondary | ICD-10-CM

## 2020-08-15 LAB — POCT GLYCOSYLATED HEMOGLOBIN (HGB A1C): Hemoglobin A1C: 7.2 % — AB (ref 4.0–5.6)

## 2020-08-15 LAB — GLUCOSE, CAPILLARY: Glucose-Capillary: 133 mg/dL — ABNORMAL HIGH (ref 70–99)

## 2020-08-15 MED ORDER — METFORMIN HCL 1000 MG PO TABS: 1000.0000 mg | ORAL_TABLET | Freq: Two times a day (BID) | ORAL | 3 refills | Status: DC

## 2020-08-15 MED ORDER — ROPINIROLE HCL 2 MG PO TABS: 2.0000 mg | ORAL_TABLET | Freq: Every day | ORAL | 1 refills | Status: DC

## 2020-08-15 MED ORDER — OMEPRAZOLE 40 MG PO CPDR: 1.0000 | DELAYED_RELEASE_CAPSULE | Freq: Every day | ORAL | 3 refills | Status: DC

## 2020-08-15 MED ORDER — GLIPIZIDE 5 MG PO TABS: 5.0000 mg | ORAL_TABLET | Freq: Every day | ORAL | 3 refills | Status: DC

## 2020-08-15 MED ORDER — BUPROPION HCL ER (SR) 150 MG PO TB12: 150.0000 mg | ORAL_TABLET | Freq: Two times a day (BID) | ORAL | 3 refills | Status: DC

## 2020-08-15 MED ORDER — ATORVASTATIN CALCIUM 80 MG PO TABS: 80.0000 mg | ORAL_TABLET | Freq: Every day | ORAL | 3 refills | Status: DC

## 2020-08-15 MED ORDER — LOSARTAN POTASSIUM 100 MG PO TABS: 100.0000 mg | ORAL_TABLET | Freq: Every day | ORAL | 3 refills | Status: DC

## 2020-08-15 NOTE — Assessment & Plan Note (Signed)
Symptoms have significantly improved after restarting Humira earlier this year.  Checking CBC and iron studies.  She is due for GI follow-up.  She believes she already has an appointment scheduled.  We will request records at her next visit.

## 2020-08-15 NOTE — Assessment & Plan Note (Signed)
Well-controlled on Wellbutrin and Zoloft.  Refills of Wellbutrin sent to her pharmacy.

## 2020-08-15 NOTE — Assessment & Plan Note (Signed)
Well-controlled.  Continue losartan 100 mg daily.  Refill sent to her pharmacy.  Checking renal function today.

## 2020-08-15 NOTE — Assessment & Plan Note (Addendum)
Patient here with persistent pain and intermittent swelling of her left knee. On exam today she has bony enlargement. I am unable to appreciate an effusion but she reports intermittent swelling. She presented to the ED for this a few months ago and underwent joint aspiration with 15 cc of fluid removed.  Fluid analysis showed WBC count of 1400 and no crystals.  Cultures were negative.  She also had x-rays of this knee which showed degenerative changes but no acute abnormality. Suspect her pain is due osteoarthritis with associated effusion.   Discussed treatment options today, patient has elected for intra-articular steroid injection.  Procedure Note  Indication:  Left Knee osteoarthritis   Operators: Drs Philipp Ovens  The patient was provided with risks, benefits, and alternatives to intraarticular injection. She consented to intraarticular knee injection for left knee osteoarthritis.  After a time out was preformed, the knee was prepped in a sterile fashion. Cold spray was applied to the skin over the insertion site (lateral inferior patellar aspect of the knee while in the seated position). A mixture of 2 cc 1% xylocaine and 40 mg of Kenalog was injected into the knee using a 25 gauge 1/2 inch needle.  The left knee space was entered without difficulty, entered successfully after the second attempt. The patient tolerated the procedures well without immediate complication.

## 2020-08-15 NOTE — Assessment & Plan Note (Signed)
Stable, near goal.  Hemoglobin A1c today 7.2.  Currently taking metformin 1000 mg twice daily and glipizide 5 mg daily.  Therapy options have been limited due to her being uninsured.  She has previously declined $4 prescriptions through the [facility] outpatient pharmacy.  She does not have her glucometer with her today but denies episodes of hypoglycemia.  Continue current regimen.

## 2020-08-15 NOTE — Patient Instructions (Signed)
Ms. Banes,  It was a pleasure to see you. Please follow up with me again in 6 months. I will call you with the results of your blood work. If you have any questions or concerns, call our clinic at [phone] or after hours call [phone] and ask for the internal medicine resident on call. Thank you!  Dr. Philipp Ovens

## 2020-08-15 NOTE — Assessment & Plan Note (Signed)
Checking TSH.  Currently on Synthroid 150 mcg daily.

## 2020-08-15 NOTE — Assessment & Plan Note (Signed)
Patient is here for hospital follow-up after admission 3 months ago for complicated UTI with AKI.  Urine cultures grew pansensitive E. coli.  She was discharged on a course of cefdinir which she completed.  Symptoms have completely resolved.  Rechecking BMP today.

## 2020-08-15 NOTE — Progress Notes (Signed)
7.2 

## 2020-08-15 NOTE — Assessment & Plan Note (Signed)
Currently on Lipitor 80 mg daily.  Follow-up repeat lipid panel.

## 2020-08-15 NOTE — Progress Notes (Signed)
Subjective:   Patient ID: Kendra Adams female   DOB: 1960/03/27 60 y.o.   MRN: [id number]  HPI: Kendra Adams is a 60 y.o. female with past medical history outlined below here for follow up of her chronic medical conditions. For the details of today's visit, please refer to the assessment and plan.   Past Medical History:  Diagnosis Date  . Allergic rhinitis   . Asthma   . Atopic dermatitis   . CAD (coronary artery disease)    a. 12/13/10: mRCA 99%, EF 65-70%;  s/p BMS to mRCA  . COPD (chronic obstructive pulmonary disease) ([place])   . Depression   . DM2 (diabetes mellitus, type 2) ([place])   . GERD (gastroesophageal reflux disease)   . GI bleed   . HTN (hypertension)   . Hyperlipidemia   . Hyperlipidemia   . Hypothyroidism   . Psoriasis   . Seasonal allergies   . Tobacco abuse    Current Outpatient Medications  Medication Sig Dispense Refill  . rOPINIRole (REQUIP) 2 MG tablet Take 1 tablet (2 mg total) by mouth at bedtime. 90 tablet 1  . ACCU-CHEK GUIDE test strip USE  STRIP TO CHECK GLUCOSE ONCE DAILY 50 each 0  . Accu-Chek Softclix Lancets lancets USE   TO CHECK GLUCOSE ONCE DAILY 100 each 0  . acetaminophen (TYLENOL) 325 MG tablet Take 2 tablets (650 mg total) by mouth every 6 (six) hours as needed for mild pain (or Fever >/= 101). 30 tablet 0  . albuterol (PROVENTIL HFA;VENTOLIN HFA) 108 (90 Base) MCG/ACT inhaler Inhale 1-2 puffs into the lungs every 6 (six) hours as needed for wheezing or shortness of breath. 1 Inhaler 0  . aspirin 81 MG chewable tablet Chew 81 mg by mouth every morning.     Marland Kitchen atorvastatin (LIPITOR) 80 MG tablet Take 1 tablet (80 mg total) by mouth daily. 90 tablet 3  . betamethasone dipropionate (DIPROLENE) 0.05 % cream Apply topically 2 (two) times daily. (Patient taking differently: Apply 1 application topically 2 (two) times daily as needed (psoriasis).) 45 g 2  . buPROPion (WELLBUTRIN SR) 150 MG 12 hr tablet Take 1 tablet (150 mg total) by  mouth 2 (two) times daily. 180 tablet 3  . cefdinir (OMNICEF) 300 MG capsule Take 1 capsule (300 mg total) by mouth every 12 (twelve) hours. 10 capsule 0  . cetirizine (EQ ALLERGY RELIEF, CETIRIZINE,) 10 MG tablet Take 1 tablet (10 mg total) by mouth daily. 30 tablet 2  . ferrous sulfate 325 (65 FE) MG EC tablet Take 1 tablet (325 mg total) by mouth daily with breakfast. 30 tablet 11  . fluticasone (FLONASE) 50 MCG/ACT nasal spray Place 2 sprays into both nostrils daily. 16 g 2  . glipiZIDE (GLUCOTROL) 5 MG tablet Take 1 tablet (5 mg total) by mouth daily. 90 tablet 3  . levothyroxine (SYNTHROID) 150 MCG tablet Take 1 tablet (150 mcg total) by mouth daily before breakfast. 90 tablet 3  . losartan (COZAAR) 100 MG tablet Take 1 tablet (100 mg total) by mouth at bedtime. 90 tablet 3  . metFORMIN (GLUCOPHAGE) 1000 MG tablet Take 1 tablet (1,000 mg total) by mouth 2 (two) times daily. 180 tablet 3  . metoprolol succinate (TOPROL-XL) 100 MG 24 hr tablet TAKE 1 TABLET BY MOUTH ONCE DAILY WITH  OR  IMMEDIATELY  FOLLOWING  A  MEAL (Patient taking differently: Take 100 mg by mouth daily.) 90 tablet 3  . nitroGLYCERIN (NITROSTAT) 0.4 MG SL tablet  Place 1 tablet (0.4 mg total) under the tongue every 5 (five) minutes as needed for chest pain. 25 tablet 3  . omeprazole (PRILOSEC) 40 MG capsule Take 1 capsule (40 mg total) by mouth daily. 90 capsule 3  . ondansetron (ZOFRAN) 4 MG tablet Take 1 tablet (4 mg total) by mouth every 8 (eight) hours as needed for nausea or vomiting. 4 tablet 0  . sertraline (ZOLOFT) 100 MG tablet TAKE 1 & 1/2 (ONE & ONE-HALF) TABLETS BY MOUTH ONCE DAILY 45 tablet 5  . Spacer/Aero-Holding Chambers (AEROCHAMBER PLUS) inhaler Use as instructed 1 each 2   No current facility-administered medications for this visit.   Family History  Problem Relation Age of Onset  . Diabetes Mother   . Hypertension Mother    Social History   Socioeconomic History  . Marital status: Married     Spouse name: Not on file  . Number of children: 0  . Years of education: 10th grade  . Highest education level: Not on file  Occupational History  . Occupation: Conservation officer, nature    Comment: self-employed  . 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 Smoking status: Current Every Day Smoker    Packs/day: 0.30    Years: 40.00    Pack years: 12.00    Types: Cigarettes    Last attempt to quit: 05/19/2014    Years since quitting: 6.2  . Smokeless tobacco: Never Used  . Tobacco comment: 2 cigs/day.  Vaping Use  . Vaping Use: Some days  Substance and Sexual Activity  . Alcohol use: No    Alcohol/week: 0.0 standard drinks  . Drug use: Yes    Frequency: 7.0 times per week    Types: Marijuana    Comment: Marijuana Use  . Sexual activity: Not on file  Other Topics Concern  . Not on file  Social History Narrative   Father died of suicide (gunshot) in 40. 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       Patient lives with her husband, her husband's niece Tammy who is mentally retarded and her best friend and the friend's daughter and 6 dogs               Social Determinants of Radio broadcast assistant Strain: Not on file  Food Insecurity: Not on file  Transportation Needs: Not on file  Physical Activity: Not on file  Stress: Not on file  Social Connections: Not on file    Review of Systems: Review of Systems  Constitutional: Negative for chills and fever.  Musculoskeletal: Positive for joint pain.     Objective:  Physical Exam:  Vitals:   08/15/20 1022  BP: 120/82  Pulse: 92  Temp: 98.9 F (37.2 C)  TempSrc: Oral  SpO2: 98%  Weight: 248 lb 3.2 oz (112.6 kg)  Height: 5' 3"  (1.6 m)    Physical Exam Constitutional:      Appearance: Normal appearance.  Cardiovascular:     Rate and Rhythm: Normal rate and regular rhythm.     Heart sounds: No murmur  heard.   Pulmonary:     Effort: Pulmonary effort is normal.     Breath sounds: Normal breath sounds.  Musculoskeletal:     Comments: Right knee with bony enlargement, no effusion  Skin:    General: Skin is warm and  dry.  Neurological:     Mental Status: She is alert.  Psychiatric:        Mood and Affect: Mood normal.        Behavior: Behavior normal.      Assessment & Plan:   See Encounters Tab for problem based charting.

## 2020-08-15 NOTE — Assessment & Plan Note (Signed)
Significantly improved since restarting Humira for her Crohn's colitis.  Continue to monitor.

## 2020-08-15 NOTE — Assessment & Plan Note (Signed)
Patient reports worsening symptoms of restless leg syndrome and has increased her ropinirole from 1 to 2 mg.  She occasionally takes this twice a day and will sometimes skip days in between.  Advised patient not to adjust her medications without speaking with the physician.  Instructed her to take once daily only as prescribed.  I have increased her ropinirole dose to 2 mg nightly.  Given worsening symptoms, I am also repeating iron studies.  She is at risk for iron deficiency with her Crohn's colitis.

## 2020-08-16 LAB — FERRITIN: Ferritin: 29 ng/mL (ref 15–150)

## 2020-08-16 LAB — CBC WITH DIFFERENTIAL/PLATELET
Basophils Absolute: 0.1 10*3/uL (ref 0.0–0.2)
Basos: 1 %
EOS (ABSOLUTE): 0.4 10*3/uL (ref 0.0–0.4)
Eos: 5 %
Hematocrit: 38.6 % (ref 34.0–46.6)
Hemoglobin: 13.2 g/dL (ref 11.1–15.9)
Immature Grans (Abs): 0 10*3/uL (ref 0.0–0.1)
Immature Granulocytes: 0 %
Lymphocytes Absolute: 2.2 10*3/uL (ref 0.7–3.1)
Lymphs: 24 %
MCH: 31 pg (ref 26.6–33.0)
MCHC: 34.2 g/dL (ref 31.5–35.7)
MCV: 91 fL (ref 79–97)
Monocytes Absolute: 0.9 10*3/uL (ref 0.1–0.9)
Monocytes: 10 %
Neutrophils Absolute: 5.4 10*3/uL (ref 1.4–7.0)
Neutrophils: 60 %
Platelets: 229 10*3/uL (ref 150–450)
RBC: 4.26 x10E6/uL (ref 3.77–5.28)
RDW: 13.9 % (ref 11.7–15.4)
WBC: 9 10*3/uL (ref 3.4–10.8)

## 2020-08-16 LAB — CMP14 + ANION GAP
ALT: 20 IU/L (ref 0–32)
AST: 21 IU/L (ref 0–40)
Albumin/Globulin Ratio: 1.7 (ref 1.2–2.2)
Albumin: 4.5 g/dL (ref 3.8–4.8)
Alkaline Phosphatase: 135 IU/L — ABNORMAL HIGH (ref 44–121)
Anion Gap: 18 mmol/L (ref 10.0–18.0)
BUN/Creatinine Ratio: 12 (ref 12–28)
BUN: 11 mg/dL (ref 8–27)
Bilirubin Total: <0.2 mg/dL (ref 0.0–1.2)
CO2: 20 mmol/L (ref 20–29)
Calcium: 9.7 mg/dL (ref 8.7–10.3)
Chloride: 103 mmol/L (ref 96–106)
Creatinine, Ser: 0.93 mg/dL (ref 0.57–1.00)
Globulin, Total: 2.6 g/dL (ref 1.5–4.5)
Glucose: 124 mg/dL — ABNORMAL HIGH (ref 65–99)
Potassium: 4.2 mmol/L (ref 3.5–5.2)
Sodium: 141 mmol/L (ref 134–144)
Total Protein: 7.1 g/dL (ref 6.0–8.5)
eGFR: 70 mL/min/{1.73_m2} (ref >59)

## 2020-08-16 LAB — LIPID PANEL
Chol/HDL Ratio: 6.1 ratio — ABNORMAL HIGH (ref 0.0–4.4)
Cholesterol, Total: 208 mg/dL — ABNORMAL HIGH (ref 100–199)
HDL: 34 mg/dL — ABNORMAL LOW (ref >39)
LDL Chol Calc (NIH): 133 mg/dL — ABNORMAL HIGH (ref 0–99)
Triglycerides: 230 mg/dL — ABNORMAL HIGH (ref 0–149)
VLDL Cholesterol Cal: 41 mg/dL — ABNORMAL HIGH (ref 5–40)

## 2020-08-16 LAB — IRON AND TIBC
Iron Saturation: 14 % — ABNORMAL LOW (ref 15–55)
Iron: 47 ug/dL (ref 27–139)
Total Iron Binding Capacity: 328 ug/dL (ref 250–450)
UIBC: 281 ug/dL (ref 118–369)

## 2020-08-16 LAB — TSH: TSH: 1.91 u[IU]/mL (ref 0.450–4.500)

## 2020-08-17 ENCOUNTER — Other Ambulatory Visit: Payer: Self-pay | Admitting: Internal Medicine

## 2020-08-17 DIAGNOSIS — D62 Acute posthemorrhagic anemia: Secondary | ICD-10-CM

## 2020-08-17 MED ORDER — FERROUS SULFATE 325 (65 FE) MG PO TBEC: 325.0000 mg | DELAYED_RELEASE_TABLET | Freq: Every day | ORAL | 1 refills | Status: DC

## 2020-09-04 ENCOUNTER — Other Ambulatory Visit: Payer: Self-pay | Admitting: *Deleted

## 2020-09-04 DIAGNOSIS — E118 Type 2 diabetes mellitus with unspecified complications: Secondary | ICD-10-CM

## 2020-09-04 DIAGNOSIS — I152 Hypertension secondary to endocrine disorders: Secondary | ICD-10-CM

## 2020-09-04 DIAGNOSIS — E785 Hyperlipidemia, unspecified: Secondary | ICD-10-CM

## 2020-09-04 DIAGNOSIS — I10 Essential (primary) hypertension: Secondary | ICD-10-CM

## 2020-09-04 DIAGNOSIS — F17200 Nicotine dependence, unspecified, uncomplicated: Secondary | ICD-10-CM

## 2020-09-04 DIAGNOSIS — K219 Gastro-esophageal reflux disease without esophagitis: Secondary | ICD-10-CM

## 2020-09-04 DIAGNOSIS — I2510 Atherosclerotic heart disease of native coronary artery without angina pectoris: Secondary | ICD-10-CM

## 2020-09-04 DIAGNOSIS — E038 Other specified hypothyroidism: Secondary | ICD-10-CM

## 2020-09-04 MED ORDER — LEVOTHYROXINE SODIUM 150 MCG PO TABS: 150.0000 ug | ORAL_TABLET | Freq: Every day | ORAL | 3 refills | Status: DC

## 2020-09-04 MED ORDER — BUPROPION HCL ER (SR) 150 MG PO TB12: 150.0000 mg | ORAL_TABLET | Freq: Two times a day (BID) | ORAL | 3 refills | Status: DC

## 2020-09-04 MED ORDER — METFORMIN HCL 1000 MG PO TABS: 1000.0000 mg | ORAL_TABLET | Freq: Two times a day (BID) | ORAL | 3 refills | Status: DC

## 2020-09-04 MED ORDER — OMEPRAZOLE 40 MG PO CPDR: 40.0000 mg | DELAYED_RELEASE_CAPSULE | Freq: Every day | ORAL | 3 refills | Status: DC

## 2020-09-04 MED ORDER — GLIPIZIDE 5 MG PO TABS: 5.0000 mg | ORAL_TABLET | Freq: Every day | ORAL | 3 refills | Status: DC

## 2020-09-04 MED ORDER — METOPROLOL SUCCINATE ER 100 MG PO TB24: ORAL_TABLET | ORAL | 3 refills | Status: DC

## 2020-09-04 MED ORDER — ATORVASTATIN CALCIUM 80 MG PO TABS: 80.0000 mg | ORAL_TABLET | Freq: Every day | ORAL | 3 refills | Status: DC

## 2020-09-04 MED ORDER — SERTRALINE HCL 100 MG PO TABS: ORAL_TABLET | ORAL | 5 refills | Status: DC

## 2020-09-04 MED ORDER — ROPINIROLE HCL 2 MG PO TABS: 2.0000 mg | ORAL_TABLET | Freq: Every day | ORAL | 1 refills | Status: DC

## 2020-09-04 MED ORDER — LOSARTAN POTASSIUM 100 MG PO TABS: 100.0000 mg | ORAL_TABLET | Freq: Every day | ORAL | 3 refills | Status: DC

## 2020-09-11 ENCOUNTER — Encounter: Payer: Self-pay | Admitting: *Deleted

## 2020-10-25 ENCOUNTER — Other Ambulatory Visit: Payer: Self-pay | Admitting: Internal Medicine

## 2020-10-25 DIAGNOSIS — K219 Gastro-esophageal reflux disease without esophagitis: Secondary | ICD-10-CM

## 2020-10-25 NOTE — Telephone Encounter (Signed)
Refill request from [facility] for omeprazole 87m cap daily. Last rx was sent to [facility] Attempted to contact pt to confirm pharmacy-no answer, message left on recorder Will have MD review request and send in if appropriate

## 2020-11-05 ENCOUNTER — Other Ambulatory Visit: Payer: Self-pay

## 2020-11-05 ENCOUNTER — Ambulatory Visit (HOSPITAL_COMMUNITY)
Admission: EM | Admit: 2020-11-05 | Discharge: 2020-11-05 | Disposition: A | Discharge status: Home / Self Care | Payer: Self-pay | Attending: Sports Medicine | Admitting: Sports Medicine

## 2020-11-05 ENCOUNTER — Encounter (HOSPITAL_COMMUNITY): Payer: Self-pay | Admitting: Emergency Medicine

## 2020-11-05 DIAGNOSIS — S61511A Laceration without foreign body of right wrist, initial encounter: Secondary | ICD-10-CM

## 2020-11-05 NOTE — Discharge Instructions (Addendum)
Change bandage once daily with new Band-Aid and apply Neosporin/bacitracin ointment  Look for any signs of redness or infection, if these are present please return to your PCP or urgent care

## 2020-11-05 NOTE — ED Triage Notes (Signed)
PT reports laceration to right wrist from box cutter, she was trying to prepare her recycling. This occurred yesterday at 5pm

## 2020-11-05 NOTE — ED Provider Notes (Signed)
[facility]    CSN: [id number] Arrival date & time: 11/05/20  1659      History   Chief Complaint Chief Complaint  Patient presents with   Extremity Laceration    Right wrist    HPI Kendra Adams is a pleasant 60 y.o. female who presents for laceration on her lateral wrist.   HPI  Patient presents for evaluation of a right ulnar-sided laceration of her distal wrist.  She states she was using box cutters a box cutter knife yesterday when her wrist accidentally slipped and she cut the lateral aspect of her distal wrist.  She states this happened at about 5 PM yesterday on 8/28. she states she applied immediate pressure and there was very little bleeding at that time.  He did not think she needed evaluation last night, but she wanted to come get checked out today as she does have a history of diabetes and does not want this to get infected.  Night she did wash this copiously with soap and water.  Any residual pain, no surrounding erythema or bruising.  The wound is no longer bleeding.  She is able to move the wrist and all 5 digits without difficulty or pain.  She reports good sensation.  She also states she recently had her Tdap immunization 3 months ago.,  She feels well without fever chills or any signs of systemic illness.  Past Medical History:  Diagnosis Date   Allergic rhinitis    Asthma    Atopic dermatitis    CAD (coronary artery disease)    a. 12/13/10: mRCA 99%, EF 65-70%;  s/p BMS to mRCA   COPD (chronic obstructive pulmonary disease) (HCC)    Depression    DM2 (diabetes mellitus, type 2) (HCC)    GERD (gastroesophageal reflux disease)    GI bleed    HTN (hypertension)    Hyperlipidemia    Hyperlipidemia    Hypothyroidism    Psoriasis    Seasonal allergies    Tobacco abuse     Patient Active Problem List   Diagnosis Date Noted   Osteoarthritis of left knee    History of UTI 06/13/2020   Need for vaccination against hepatitis B virus 12/02/2018    Hypocalcemia 12/02/2018   Crohn's colitis ([place]) 09/22/2018   Anemia 09/22/2018   Aortic atherosclerosis ([place]) 09/12/2018   Hip pain 05/27/2017   Dyspareunia, female 04/06/2017   History of restless legs syndrome 11/24/2016   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   Psoriasis 10/15/2015   Tendinitis of left rotator cuff 01/19/2015   Tobacco use disorder 09/07/2014   Morbid obesity ([place]) 03/19/2011   Routine health maintenance 02/03/2011   CAD (coronary artery disease) 12/26/2010   Diabetes mellitus type 2 with complications ([place]) 56/81/2751   Hypertension associated with diabetes ([place]) 05/10/2008   Hypothyroidism 04/27/2008   HLD (hyperlipidemia) 04/27/2008   Depression 04/27/2008   GERD 04/27/2008    Past Surgical History:  Procedure Laterality Date   BIOPSY  10/28/2018   Procedure: BIOPSY;  Surgeon: Juanita Craver, MD;  Location: [facility];  Service: Endoscopy;;   CARDIAC CATHETERIZATION N/A 08/14/2015   Procedure: Left Heart Cath and Coronary Angiography;  Surgeon: Peter M Martinique, MD;  Location: [facility];  Service: Cardiovascular;  Laterality: N/A;   CARPAL TUNNEL RELEASE     w/ bone spurs   CHOLECYSTECTOMY N/A 08/15/2015   Procedure: LAPAROSCOPIC CHOLECYSTECTOMY WITH ATTEMPTED INTRAOPERATIVE CHOLANGIOGRAM;  Surgeon: Erroll Luna, MD;  Location:  [facility] OR;  Service: General;  Laterality: N/A;   COLONOSCOPY WITH PROPOFOL N/A 10/28/2018   Procedure: COLONOSCOPY WITH PROPOFOL;  Surgeon: Juanita Craver, MD;  Location: [facility];  Service: Endoscopy;  Laterality: N/A;   CORONARY ANGIOPLASTY WITH STENT PLACEMENT     DILATION AND CURETTAGE OF UTERUS     LEFT HEART CATH AND CORONARY ANGIOGRAPHY N/A 11/23/2019   Procedure: LEFT HEART CATH AND CORONARY ANGIOGRAPHY;  Surgeon: Burnell Blanks, MD;  Location: [facility];  Service: Cardiovascular;  Laterality: N/A;    OB History     Gravida  2   Para      Term      Preterm      AB  2    Living         SAB  1   IAB  1   Ectopic      Multiple      Live Births               Home Medications    Prior to Admission medications   Medication Sig Start Date End Date Taking? Authorizing Provider  ACCU-CHEK GUIDE test strip USE  STRIP TO CHECK GLUCOSE ONCE DAILY 05/24/20   [name], MD  Accu-Chek Softclix Lancets lancets USE   TO CHECK GLUCOSE ONCE DAILY 05/24/20   [name], MD  acetaminophen (TYLENOL) 325 MG tablet Take 2 tablets (650 mg total) by mouth every 6 (six) hours as needed for mild pain (or Fever >/= 101). 06/14/20   Cato Mulligan, MD  albuterol (PROVENTIL HFA;VENTOLIN HFA) 108 (90 Base) MCG/ACT inhaler Inhale 1-2 puffs into the lungs every 6 (six) hours as needed for wheezing or shortness of breath. 01/01/18   [name], MD  aspirin 81 MG chewable tablet Chew 81 mg by mouth every morning.     [provider]  atorvastatin (LIPITOR) 80 MG tablet Take 1 tablet (80 mg total) by mouth daily. 09/04/20   [name], MD  betamethasone dipropionate (DIPROLENE) 0.05 % cream Apply topically 2 (two) times daily. Patient taking differently: Apply 1 application topically 2 (two) times daily as needed (psoriasis). 02/15/18   [name], MD  buPROPion (WELLBUTRIN SR) 150 MG 12 hr tablet Take 1 tablet (150 mg total) by mouth 2 (two) times daily. 09/04/20   [name], MD  cetirizine (EQ ALLERGY RELIEF, CETIRIZINE,) 10 MG tablet Take 1 tablet (10 mg total) by mouth daily. 07/23/20   [name], MD  ferrous sulfate 325 (65 FE) MG EC tablet Take 1 tablet (325 mg total) by mouth daily with breakfast. 08/17/20 08/17/21  [name], MD  fluticasone (FLONASE) 50 MCG/ACT nasal spray Place 2 sprays into both nostrils daily. 09/26/19   [place], Carley D, DO  glipiZIDE (GLUCOTROL) 5 MG tablet Take 1 tablet (5 mg total) by mouth daily. 09/04/20   [name], MD  levothyroxine (SYNTHROID) 150 MCG tablet Take 1 tablet  (150 mcg total) by mouth daily before breakfast. 09/04/20   [name], MD  losartan (COZAAR) 100 MG tablet Take 1 tablet (100 mg total) by mouth at bedtime. 09/04/20   [name], MD  metFORMIN (GLUCOPHAGE) 1000 MG tablet Take 1 tablet (1,000 mg total) by mouth 2 (two) times daily. 09/04/20   [name], MD  metoprolol succinate (TOPROL-XL) 100 MG 24 hr tablet Take with or immediately following a meal. 09/04/20   [name], MD  nitroGLYCERIN (NITROSTAT) 0.4 MG SL tablet Place 1 tablet (0.4 mg total) under  the tongue every 5 (five) minutes as needed for chest pain. 10/26/19   [name], MD  omeprazole (PRILOSEC) 40 MG capsule Take 1 capsule by mouth once daily 10/26/20   [name], MD  ondansetron (ZOFRAN) 4 MG tablet Take 1 tablet (4 mg total) by mouth every 8 (eight) hours as needed for nausea or vomiting. 11/10/19   Darr, Edison Nasuti, PA-C  rOPINIRole (REQUIP) 2 MG tablet Take 1 tablet (2 mg total) by mouth at bedtime. 09/04/20   [name], MD  sertraline (ZOLOFT) 100 MG tablet TAKE 1 & 1/2 (ONE & ONE-HALF) TABLETS BY MOUTH ONCE DAILY 09/04/20   [name], MD  Spacer/Aero-Holding Chambers (AEROCHAMBER PLUS) inhaler Use as instructed 02/03/15   Melynda Ripple, MD    Family History Family History  Problem Relation Age of Onset   Diabetes Mother    Hypertension Mother     Social History Social History   Tobacco Use   Smoking status: Every Day    Packs/day: 0.30    Years: 40.00    Pack years: 12.00    Types: Cigarettes    Last attempt to quit: 05/19/2014    Years since quitting: 6.4   Smokeless tobacco: Never   Tobacco comments:    2 cigs/day.  Vaping Use   Vaping Use: Some days  Substance Use Topics   Alcohol use: No    Alcohol/week: 0.0 standard drinks   Drug use: Yes    Frequency: 7.0 times per week    Types: Marijuana    Comment: Marijuana Use     Allergies   Nsaids, Opium, Tramadol hcl, and Gabapentin   Review of  Systems Review of Systems  Constitutional:  Negative for activity change, chills and fever.  Musculoskeletal:  Negative for arthralgias.  Skin:  Positive for wound (R-lateral wrist).  Hematological:  Does not bruise/bleed easily.    Physical Exam Triage Vital Signs ED Triage Vitals  Enc Vitals Group     BP 11/05/20 1803 117/60     Pulse Rate 11/05/20 1803 67     Resp 11/05/20 1803 16     Temp 11/05/20 1803 98.4 F (36.9 C)     Temp src --      SpO2 11/05/20 1803 96 %     Weight --      Height --      Head Circumference --      Peak Flow --      Pain Score 11/05/20 1802 3     Pain Loc --      Pain Edu? --      Excl. in [place]? --    No data found.  Updated Vital Signs BP 117/60   Pulse 67   Temp 98.4 F (36.9 C)   Resp 16   SpO2 96%   Visual Acuity Right Eye Distance:   Left Eye Distance:   Bilateral Distance:    Right Eye Near:   Left Eye Near:    Bilateral Near:     Physical Exam Gen: Well-appearing, in no acute distress; non-toxic CV: Regular Rate. Well-perfused. Warm.  Resp: Breathing unlabored on room air; no wheezing. Psych: Fluid speech in conversation; appropriate affect; normal thought process Neuro: Sensation intact throughout. No gross coordination deficits.  MSK:  - Right wrist: There is a linear approximate 1.5 cm laceration of the distal lateral wrist, just lateral to the ulnar styloid, there is already evidence of soft tissue granulation and healing.  No large gapping of the laceration.  Hemostatic in nature.  There is no surrounding erythema.  5/5 strength at the wrist and all 5 digits, able to move without pain or difficulty.  Sensation to light touch intact on the dorsum plantar aspect of the wrist and hand.   UC Treatments / Results  Labs (all labs ordered are listed, but only abnormal results are displayed) Labs Reviewed - No data to display  EKG   Radiology No results found.  Procedures Procedures (including critical care  time)  Medications Ordered in UC Medications - No data to display  Initial Impression / Assessment and Plan / UC Course  I have reviewed the triage vital signs and the nursing notes.  Pertinent labs & imaging results that were available during my care of the patient were reviewed by me and considered in my medical decision making (see chart for details).     Laceration of right wrist -patient presents with a small 1.5 cm laceration to the distal lateral wrist approximately 25 hours ago.  The wound is also showing signs of granulation tissue, there is no gapping of the wound.  There is no indication for repair at this time given the duration of greater than 24 hours.  Clean the wound with iodine x 3, and alcohol swab.  The wound was then covered with bacitracin ointment and a Band-Aid, and a soft wrap.  The patient recently had a Tdap vaccine about 3 months ago, so no indication for this at the time.  Patient was given precautions for looking for signs of infection, however none were present currently.  She may change the bandage daily and apply bacitracin/antibiotic ointment.  Return precautions provided.  She is safe for discharge home. Final Clinical Impressions(s) / UC Diagnoses   Final diagnoses:  Laceration of right wrist, initial encounter     Discharge Instructions      Change bandage once daily with new Band-Aid and apply Neosporin/bacitracin ointment  Look for any signs of redness or infection, if these are present please return to your PCP or urgent care     ED Prescriptions   None    PDMP not reviewed this encounter.   Elba Barman, [place] 11/05/20 1915

## 2020-11-09 ENCOUNTER — Other Ambulatory Visit: Payer: Self-pay | Admitting: Internal Medicine

## 2020-11-09 DIAGNOSIS — K219 Gastro-esophageal reflux disease without esophagitis: Secondary | ICD-10-CM

## 2020-12-11 ENCOUNTER — Other Ambulatory Visit: Payer: Self-pay | Admitting: Internal Medicine

## 2020-12-18 ENCOUNTER — Other Ambulatory Visit: Payer: Self-pay

## 2020-12-18 NOTE — Telephone Encounter (Signed)
Thank you, refill request denied until she schedules to be seen.

## 2020-12-25 ENCOUNTER — Other Ambulatory Visit: Payer: Self-pay

## 2020-12-25 DIAGNOSIS — I10 Essential (primary) hypertension: Secondary | ICD-10-CM

## 2020-12-25 DIAGNOSIS — I2510 Atherosclerotic heart disease of native coronary artery without angina pectoris: Secondary | ICD-10-CM

## 2020-12-25 MED ORDER — METOPROLOL SUCCINATE ER 100 MG PO TB24: ORAL_TABLET | ORAL | 3 refills | Status: DC

## 2020-12-25 NOTE — Telephone Encounter (Signed)
Please have patient schedule follow up with me in December. Thank you!

## 2021-01-01 ENCOUNTER — Telehealth: Payer: Self-pay

## 2021-01-01 NOTE — Telephone Encounter (Signed)
Pt is requesting a call back .. she stated that he had fallen on 10/21 and he leg is swollen and hurting along with bruising .Marland Kitchen  pt is worried that she cut the blood flow to her leg due to her being diabetic

## 2021-01-01 NOTE — Telephone Encounter (Signed)
Noted, thank you

## 2021-01-01 NOTE — Telephone Encounter (Signed)
Return pt's call. Stated this past Friday, when she was getting off the lawnmower, she got twisted up,slipped and felled. The skin was rubbed off her left calf; she cleaned w/hydrogen peroxide x 1. Stated she did not burn herself on the lawnmower. And her husband noticed today a small open area behind her knee; he cleaned it w/hydrogen peroxide. Took Tylenol x 2 for pain; just to sleep. Hx of diabetes. She has broken skin also on left knee and left outer ankle. She's willing to come in tomorrow to see Dr Philipp Ovens. Appt schedule for 0845 AM.

## 2021-01-02 ENCOUNTER — Ambulatory Visit: Payer: Self-pay | Admitting: Internal Medicine

## 2021-01-02 ENCOUNTER — Encounter: Payer: Self-pay | Admitting: Internal Medicine

## 2021-01-02 ENCOUNTER — Other Ambulatory Visit: Payer: Self-pay

## 2021-01-02 ENCOUNTER — Other Ambulatory Visit (HOSPITAL_COMMUNITY): Payer: Self-pay

## 2021-01-02 ENCOUNTER — Other Ambulatory Visit (HOSPITAL_COMMUNITY)
Admission: RE | Admit: 2021-01-02 | Discharge: 2021-01-02 | Disposition: A | Discharge status: Home / Self Care | Payer: Self-pay | Source: Ambulatory Visit | Attending: Internal Medicine | Admitting: Internal Medicine

## 2021-01-02 VITALS — BP 130/71 | HR 80 | Temp 98.3°F | Ht 63.0 in | Wt 240.1 lb

## 2021-01-02 DIAGNOSIS — Z1231 Encounter for screening mammogram for malignant neoplasm of breast: Secondary | ICD-10-CM | POA: Insufficient documentation

## 2021-01-02 DIAGNOSIS — K5190 Ulcerative colitis, unspecified, without complications: Secondary | ICD-10-CM

## 2021-01-02 DIAGNOSIS — Z01419 Encounter for gynecological examination (general) (routine) without abnormal findings: Secondary | ICD-10-CM

## 2021-01-02 DIAGNOSIS — E118 Type 2 diabetes mellitus with unspecified complications: Secondary | ICD-10-CM

## 2021-01-02 DIAGNOSIS — E611 Iron deficiency: Secondary | ICD-10-CM

## 2021-01-02 DIAGNOSIS — E785 Hyperlipidemia, unspecified: Secondary | ICD-10-CM

## 2021-01-02 DIAGNOSIS — Z23 Encounter for immunization: Secondary | ICD-10-CM

## 2021-01-02 DIAGNOSIS — J309 Allergic rhinitis, unspecified: Secondary | ICD-10-CM

## 2021-01-02 DIAGNOSIS — Z6841 Body Mass Index (BMI) 40.0 and over, adult: Secondary | ICD-10-CM

## 2021-01-02 LAB — POCT GLYCOSYLATED HEMOGLOBIN (HGB A1C): Hemoglobin A1C: 6.5 % — AB (ref 4.0–5.6)

## 2021-01-02 LAB — GLUCOSE, CAPILLARY: Glucose-Capillary: 158 mg/dL — ABNORMAL HIGH (ref 70–99)

## 2021-01-02 MED ORDER — INSULIN PEN NEEDLE 32G X 4 MM MISC
1.0000 | Freq: Every day | 1 refills | Status: AC
  Filled 2021-01-02: qty 100, 30d supply, fill #0
  Filled 2021-10-30: qty 100, 30d supply, fill #1

## 2021-01-02 MED ORDER — LIRAGLUTIDE 18 MG/3ML ~~LOC~~ SOPN
PEN_INJECTOR | SUBCUTANEOUS | 0 refills | Status: DC
Start: 2021-01-02 — End: 2021-01-02
  Filled 2021-01-02: qty 9, 28d supply, fill #0

## 2021-01-02 MED ORDER — CETIRIZINE HCL 10 MG PO TABS: 10.0000 mg | ORAL_TABLET | Freq: Every day | ORAL | 3 refills | Status: DC

## 2021-01-02 MED ORDER — LIRAGLUTIDE 18 MG/3ML ~~LOC~~ SOPN
PEN_INJECTOR | SUBCUTANEOUS | 0 refills | Status: DC
  Filled 2021-01-02: qty 9, 30d supply, fill #0

## 2021-01-02 NOTE — Assessment & Plan Note (Addendum)
Chronic and well controlled. Hgb A1c today is 6.5. She is taking glipizide 5 mg daily but reports symptoms of hypoglycemia on this medication and frequently skips doses. She does not check her blood sugar at home. We discussed switching to a GLP-1 agonist for the dual benefit of weight loss. She is agreeable to this. Plan to stop glipizide. Start victoza, rx sent to [facility] outpatient pharamcy through the IM program $4 list. She was given instructions on how to uptitrate to 1.8 mg.  -- Stop glipizide  -- Start victoza 0.6 mg daily x 1 week; then increase to 1.2 mg daily x 1 week; then 1.8 mg daily -- Follow up 4-6 week for a telehealth appointment

## 2021-01-02 NOTE — Progress Notes (Addendum)
Subjective:   Patient ID: Kendra Adams female   DOB: 1961-01-29 60 y.o.   MRN: [id number]  HPI: [name] is a 60 y.o. female with past medical history outlined below here for a fall and follow up of her diabetes. For the details of today's visit, please refer to the assessment and plan.  Past Medical History:  Diagnosis Date   Allergic rhinitis    Asthma    Atopic dermatitis    CAD (coronary artery disease)    a. 12/13/10: mRCA 99%, EF 65-70%;  s/p BMS to mRCA   COPD (chronic obstructive pulmonary disease) (HCC)    Depression    DM2 (diabetes mellitus, type 2) (HCC)    GERD (gastroesophageal reflux disease)    GI bleed    HTN (hypertension)    Hyperlipidemia    Hyperlipidemia    Hypothyroidism    Psoriasis    Seasonal allergies    Tobacco abuse    Current Outpatient Medications  Medication Sig Dispense Refill   Adalimumab 40 MG/0.4ML PSKT Inject 40 mg into the skin every 14 (fourteen) days.     Insulin Pen Needle 32G X 4 MM MISC Use daily as diretced 100 each 1   ACCU-CHEK GUIDE test strip USE  STRIP TO CHECK GLUCOSE ONCE DAILY 50 each 0   Accu-Chek Softclix Lancets lancets USE   TO CHECK GLUCOSE ONCE DAILY 100 each 0   acetaminophen (TYLENOL) 325 MG tablet Take 2 tablets (650 mg total) by mouth every 6 (six) hours as needed for mild pain (or Fever >/= 101). 30 tablet 0   albuterol (PROVENTIL HFA;VENTOLIN HFA) 108 (90 Base) MCG/ACT inhaler Inhale 1-2 puffs into the lungs every 6 (six) hours as needed for wheezing or shortness of breath. 1 Inhaler 0   aspirin 81 MG chewable tablet Chew 81 mg by mouth every morning.      atorvastatin (LIPITOR) 80 MG tablet Take 1 tablet (80 mg total) by mouth daily. 90 tablet 3   betamethasone dipropionate (DIPROLENE) 0.05 % cream Apply topically 2 (two) times daily. (Patient taking differently: Apply 1 application topically 2 (two) times daily as needed (psoriasis).) 45 g 2   buPROPion (WELLBUTRIN SR) 150 MG 12 hr tablet Take 1  tablet (150 mg total) by mouth 2 (two) times daily. 180 tablet 3   cetirizine (ZYRTEC) 10 MG tablet Take 1 tablet (10 mg total) by mouth daily. 90 tablet 3   ferrous sulfate 325 (65 FE) MG EC tablet Take 1 tablet (325 mg total) by mouth daily with breakfast. 90 tablet 1   fluticasone (FLONASE) 50 MCG/ACT nasal spray Place 2 sprays into both nostrils daily. 16 g 2   levothyroxine (SYNTHROID) 150 MCG tablet Take 1 tablet (150 mcg total) by mouth daily before breakfast. 90 tablet 3   liraglutide (VICTOZA) 18 MG/3ML SOPN Inject 0.6 mg into the skin daily for 7 days, THEN 1.2 mg daily for 7 days, THEN 1.8 mg daily for 14 days. 9 mL 0   losartan (COZAAR) 100 MG tablet Take 1 tablet (100 mg total) by mouth at bedtime. 90 tablet 3   metFORMIN (GLUCOPHAGE) 1000 MG tablet Take 1 tablet (1,000 mg total) by mouth 2 (two) times daily. 180 tablet 3   metoprolol succinate (TOPROL-XL) 100 MG 24 hr tablet Take with or immediately following a meal. 90 tablet 3   nitroGLYCERIN (NITROSTAT) 0.4 MG SL tablet Place 1 tablet (0.4 mg total) under the tongue every 5 (five) minutes as needed  for chest pain. 25 tablet 3   omeprazole (PRILOSEC) 40 MG capsule Take 1 capsule by mouth once daily 90 capsule 0   ondansetron (ZOFRAN) 4 MG tablet Take 1 tablet (4 mg total) by mouth every 8 (eight) hours as needed for nausea or vomiting. 4 tablet 0   rOPINIRole (REQUIP) 2 MG tablet Take 1 tablet (2 mg total) by mouth at bedtime. 90 tablet 1   sertraline (ZOLOFT) 100 MG tablet TAKE 1 & 1/2 (ONE & ONE-HALF) TABLETS BY MOUTH ONCE DAILY 45 tablet 5   Spacer/Aero-Holding Chambers (AEROCHAMBER PLUS) inhaler Use as instructed 1 each 2   No current facility-administered medications for this visit.   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  Vaping Use   Vaping Use: Some days  Substance and Sexual Activity   Alcohol use: No    Alcohol/week: 0.0 standard drinks   Drug use: Yes    Frequency: 7.0 times per week    Types: Marijuana    Comment: Marijuana Use   Sexual activity: Not on file  Other Topics Concern   Not on file  Social History Narrative   Father died of suicide (gunshot) in 69. 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       Patient lives with her husband, her husband's niece Tammy who is mentally retarded and her best friend and the friend's daughter and 6 dogs               Social Determinants of Radio broadcast assistant Strain: Not on file  Food Insecurity: Not on file  Transportation Needs: Not on file  Physical Activity: Not on file  Stress: Not on file  Social Connections: Not on file    Review of Systems: Review of Systems  Musculoskeletal:  Positive for falls.       Left leg pain     Objective:  Physical Exam:  Vitals:   01/02/21 0846  BP: 130/71  Pulse: 80  Temp: 98.3 F (36.8 C)  TempSrc: Oral  SpO2: 97%  Weight: 240 lb 1.6 oz (108.9 kg)  Height: 5' 3"  (1.6 m)    Physical Exam Constitutional:      Appearance: Normal appearance.  Cardiovascular:     Rate and Rhythm: Normal rate and regular rhythm.  Pulmonary:     Effort: Pulmonary effort is normal.     Breath sounds: Normal breath sounds.  Genitourinary:    General: Normal vulva.     Comments: Speculum exam  with atrophic changes. Cervix visualized without abnormality. Musculoskeletal:     Right lower  leg: No edema.     Left lower leg: No edema.     Comments: Deep bruising / ecchymosis of her left calf with two superficial abrasions. No evidence of infection or induration.   Neurological:     Mental Status: She is alert.     Assessment & Plan:   See Encounters Tab for problem based charting.

## 2021-01-02 NOTE — Assessment & Plan Note (Signed)
Well controlled, currently taking Humira 40 mg injections every 15 days. Follows closely with GI.

## 2021-01-02 NOTE — Assessment & Plan Note (Signed)
Lipid panel checked last visit with LDL above goal. Patient admitted to non compliance. She has been taking her Lipitor regularly since that visit. Repeating lipid panel today.

## 2021-01-02 NOTE — Patient Instructions (Addendum)
Ms. Aybar,  It was a pleasure to see you today. Please stop taking glipizide. Instead, start injecting victoza (aka liraglutide) once a day. Please inject: - 0.6 mg daily x 1 week - then increase to 1.2 mg daily x 1 week - then 1.8 mg daily x 1 week - then 2.4 mg daily x 1 week  Follow up with me in 4-6 weeks for a telehealth appointment.   Continue taking your other medications as prescribed. I will call you with the results of your blood work and your pap smear.   If you have any questions or concerns, call our clinic at [phone] or after hours call [phone] and ask for the internal medicine resident on call.   Thank you!  Dr. Philipp Ovens

## 2021-01-03 LAB — LIPID PANEL
Chol/HDL Ratio: 4.1 ratio (ref 0.0–4.4)
Cholesterol, Total: 138 mg/dL (ref 100–199)
HDL: 34 mg/dL — ABNORMAL LOW (ref >39)
LDL Chol Calc (NIH): 73 mg/dL (ref 0–99)
Triglycerides: 185 mg/dL — ABNORMAL HIGH (ref 0–149)
VLDL Cholesterol Cal: 31 mg/dL (ref 5–40)

## 2021-01-03 LAB — IRON AND TIBC
Iron Saturation: 25 % (ref 15–55)
Iron: 81 ug/dL (ref 27–139)
Total Iron Binding Capacity: 326 ug/dL (ref 250–450)
UIBC: 245 ug/dL (ref 118–369)

## 2021-01-03 LAB — FERRITIN: Ferritin: 64 ng/mL (ref 15–150)

## 2021-01-04 LAB — CYTOLOGY - PAP
Comment: NEGATIVE
Diagnosis: NEGATIVE
Diagnosis: REACTIVE
High risk HPV: NEGATIVE

## 2021-01-05 ENCOUNTER — Encounter: Payer: Self-pay | Admitting: Internal Medicine

## 2021-01-05 ENCOUNTER — Other Ambulatory Visit: Payer: Self-pay | Admitting: Internal Medicine

## 2021-01-05 DIAGNOSIS — J309 Allergic rhinitis, unspecified: Secondary | ICD-10-CM

## 2021-01-05 NOTE — Assessment & Plan Note (Signed)
We are starting victoza for DM and weight loss. Sent to [facility] outpatient pharmacy through the $4 IM program. Follow up 4-6 weeks for telehealth.

## 2021-01-05 NOTE — Assessment & Plan Note (Signed)
Repeat iron studies are improved. Continue iron supplementation

## 2021-01-05 NOTE — Assessment & Plan Note (Signed)
Routine PAP smear collected today. F/u results.

## 2021-01-29 ENCOUNTER — Other Ambulatory Visit: Payer: Self-pay | Admitting: Internal Medicine

## 2021-02-13 ENCOUNTER — Encounter: Payer: Self-pay | Admitting: Internal Medicine

## 2021-02-13 ENCOUNTER — Other Ambulatory Visit: Payer: Self-pay

## 2021-02-13 ENCOUNTER — Ambulatory Visit (INDEPENDENT_AMBULATORY_CARE_PROVIDER_SITE_OTHER): Payer: Self-pay | Admitting: Internal Medicine

## 2021-02-13 ENCOUNTER — Other Ambulatory Visit (HOSPITAL_COMMUNITY): Payer: Self-pay

## 2021-02-13 DIAGNOSIS — Z6841 Body Mass Index (BMI) 40.0 and over, adult: Secondary | ICD-10-CM

## 2021-02-13 DIAGNOSIS — E118 Type 2 diabetes mellitus with unspecified complications: Secondary | ICD-10-CM

## 2021-02-13 MED ORDER — LIRAGLUTIDE 18 MG/3ML ~~LOC~~ SOPN
PEN_INJECTOR | SUBCUTANEOUS | 1 refills | Status: DC
  Filled 2021-02-13: qty 9, 28d supply, fill #0
  Filled 2021-05-19: qty 9, 30d supply, fill #1

## 2021-02-13 NOTE — Patient Instructions (Addendum)
Ms. Doane,  It was a pleasure to see you. I have sent a new prescription for Victoza to your pharmacy. Please follow up with me again in 2 months.   If you have any questions or concerns, call our clinic at [phone] or after hours call [phone] and ask for the internal medicine resident on call.   Thank you!  Dr. Darnell Level

## 2021-02-14 NOTE — Progress Notes (Signed)
Subjective:   Patient ID: Kendra Adams female   DOB: [birth year] 60 y.o.   MRN: [id number]  HPI: Ms.Annalicia Chrys Racer Adams is a 60 y.o. female with past medical history outlined below here for medication follow up. For the details of today's visit, please refer to the assessment and plan.   Past Medical History:  Diagnosis Date   Allergic rhinitis    Asthma    Atopic dermatitis    CAD (coronary artery disease)    a. 12/13/10: mRCA 99%, EF 65-70%;  s/p BMS to mRCA   COPD (chronic obstructive pulmonary disease) (HCC)    Depression    DM2 (diabetes mellitus, type 2) (HCC)    GERD (gastroesophageal reflux disease)    GI bleed    HTN (hypertension)    Hyperlipidemia    Hyperlipidemia    Hypothyroidism    Psoriasis    Seasonal allergies    Tobacco abuse    Current Outpatient Medications  Medication Sig Dispense Refill   ACCU-CHEK GUIDE test strip USE  STRIP TO CHECK GLUCOSE ONCE DAILY 50 each 0   Accu-Chek Softclix Lancets lancets USE   TO CHECK GLUCOSE ONCE DAILY 100 each 0   acetaminophen (TYLENOL) 325 MG tablet Take 2 tablets (650 mg total) by mouth every 6 (six) hours as needed for mild pain (or Fever >/= 101). 30 tablet 0   Adalimumab 40 MG/0.4ML PSKT Inject 40 mg into the skin every 14 (fourteen) days.     albuterol (PROVENTIL HFA;VENTOLIN HFA) 108 (90 Base) MCG/ACT inhaler Inhale 1-2 puffs into the lungs every 6 (six) hours as needed for wheezing or shortness of breath. 1 Inhaler 0   aspirin 81 MG chewable tablet Chew 81 mg by mouth every morning.      atorvastatin (LIPITOR) 80 MG tablet Take 1 tablet (80 mg total) by mouth daily. 90 tablet 3   betamethasone dipropionate (DIPROLENE) 0.05 % cream Apply topically 2 (two) times daily. (Patient taking differently: Apply 1 application topically 2 (two) times daily as needed (psoriasis).) 45 g 2   buPROPion (WELLBUTRIN SR) 150 MG 12 hr tablet Take 1 tablet (150 mg total) by mouth 2 (two) times daily. 180 tablet 3   cetirizine  (ZYRTEC) 10 MG tablet Take 1 tablet (10 mg total) by mouth daily. 90 tablet 3   ferrous sulfate 325 (65 FE) MG EC tablet Take 1 tablet (325 mg total) by mouth daily with breakfast. 90 tablet 1   fluticasone (FLONASE) 50 MCG/ACT nasal spray Place 2 sprays into both nostrils daily. 16 g 2   Insulin Pen Needle 32G X 4 MM MISC Use daily as diretced 100 each 1   levothyroxine (SYNTHROID) 150 MCG tablet Take 1 tablet (150 mcg total) by mouth daily before breakfast. 90 tablet 3   liraglutide (VICTOZA) 18 MG/3ML SOPN Inject 0.6 mg into the skin daily for 7 days, THEN 1.2 mg daily for 7 days, THEN 1.8 mg daily for 14 days. 9 mL 1   losartan (COZAAR) 100 MG tablet Take 1 tablet (100 mg total) by mouth at bedtime. 90 tablet 3   metFORMIN (GLUCOPHAGE) 1000 MG tablet Take 1 tablet (1,000 mg total) by mouth 2 (two) times daily. 180 tablet 3   metoprolol succinate (TOPROL-XL) 100 MG 24 hr tablet Take with or immediately following a meal. 90 tablet 3   nitroGLYCERIN (NITROSTAT) 0.4 MG SL tablet Place 1 tablet (0.4 mg total) under the tongue every 5 (five) minutes as needed for chest pain. 25  tablet 3   omeprazole (PRILOSEC) 40 MG capsule Take 1 capsule by mouth once daily 90 capsule 0   ondansetron (ZOFRAN) 4 MG tablet Take 1 tablet (4 mg total) by mouth every 8 (eight) hours as needed for nausea or vomiting. 4 tablet 0   rOPINIRole (REQUIP) 2 MG tablet TAKE 1 TABLET BY MOUTH AT BEDTIME 90 tablet 3   sertraline (ZOLOFT) 100 MG tablet TAKE 1 & 1/2 (ONE & ONE-HALF) TABLETS BY MOUTH ONCE DAILY 45 tablet 5   Spacer/Aero-Holding Chambers (AEROCHAMBER PLUS) inhaler Use as instructed 1 each 2   No current facility-administered medications for this visit.   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  Vaping Use   Vaping Use: Some days  Substance and Sexual Activity   Alcohol use: No    Alcohol/week: 0.0 standard drinks   Drug use: Yes    Frequency: 7.0 times per week    Types: Marijuana    Comment: Marijuana Use   Sexual activity: Not on file  Other Topics Concern   Not on file  Social History Narrative   Father died of suicide (gunshot) in 26. 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       Patient lives with her husband, her husband's niece Tammy who is mentally retarded and her best friend and the friend's daughter and 6 dogs               Social Determinants of Radio broadcast assistant Strain: Not on file  Food Insecurity: Not on file  Transportation Needs: Not on file  Physical Activity: Not on file  Stress: Not on file  Social Connections: Not on file    Review of Systems: Review of Systems  Respiratory:  Negative for shortness of breath.   Cardiovascular:  Negative for chest pain.  Gastrointestinal:  Positive for diarrhea.    Objective:  Physical Exam:  Vitals:   02/13/21 0939  BP: 133/67  Pulse: 70  Temp: 98.4 F (36.9 C)  TempSrc: Oral  SpO2: 96%  Weight: 239 lb (108.4 kg)  Height: 5' 3"  (1.6 m)    Physical Exam Constitutional:      Appearance: Normal appearance. She is obese.  Cardiovascular:     Rate and Rhythm: Normal rate and regular rhythm.     Heart sounds: No murmur heard. Pulmonary:     Effort: Pulmonary effort is normal.     Breath  sounds: Normal breath sounds.  Musculoskeletal:     Right lower leg: No edema.     Left lower leg: No edema.  Skin:    General: Skin is warm and dry.  Neurological:     Mental Status: She is alert.  Psychiatric:        Mood and Affect: Mood normal.        Behavior: Behavior normal.     Assessment & Plan:   See Encounters Tab for problem based charting.

## 2021-02-14 NOTE — Assessment & Plan Note (Signed)
Patient is here to follow up after stopping glipizide and starting victoza at her last visit one month ago. She was having hypoglycemic episodes on glipizide and given her morbid obesity, agreed to try victoza for the dual benefit of weight loss. Unfortunately she has not yet started the medication. She picked it up from the pharmacy but did not start it because she was "scared". She thought it was insulin and this worried her. She also did not refrigerate the pen. I reassured her that victoza is not insulin therapy, despite the pen looking similar. She is agreeable now to trying it. I instructed her to discard the old prescription since it has been sitting out unrefridgerated. Sent a new Rx with instructions for how to up titrate. Follow up in 2 months for weight check and repeat HgbA1c.

## 2021-02-20 ENCOUNTER — Telehealth: Payer: Self-pay | Admitting: Internal Medicine

## 2021-03-01 ENCOUNTER — Other Ambulatory Visit: Payer: Self-pay | Admitting: Internal Medicine

## 2021-03-01 ENCOUNTER — Encounter: Payer: Self-pay | Admitting: Internal Medicine

## 2021-03-01 DIAGNOSIS — D62 Acute posthemorrhagic anemia: Secondary | ICD-10-CM

## 2021-03-05 NOTE — Telephone Encounter (Signed)
RTC to patient has gotten her Victoza pens.  Unsure how to use.  Was shown by family member but still would like instruction.. Given appointment with D. Plyler Diabetes Educator to work with patient on 1//05/2021 at 10:15 AM

## 2021-03-12 ENCOUNTER — Encounter: Payer: Self-pay | Admitting: Dietician

## 2021-03-12 ENCOUNTER — Ambulatory Visit: Payer: Self-pay | Admitting: Dietician

## 2021-03-12 NOTE — Progress Notes (Signed)
Diabetes Self Management Education & Support Met with patient today for Victoza injection education. She brought her Victoza pen and a pen needle. She was educated on how to use the Victoza pen, GLP-1s action and side effects and when to call the office. She verbalized and demonstrated understanding and gave  herself an injection of 0.6 mg Victoza here in the office today with minimal assistance.  Follow up as needed.  Debera Lat, RD 03/12/2021 11:37 AM.

## 2021-03-16 IMAGING — DX DG FEMUR 2+V*R*
4 series · 4 of 4 positions shown · non-contrast
Comparison: None.

CLINICAL DATA: Right femur pain after fall.

EXAM:
RIGHT FEMUR 2 VIEWS

[femur ap (1 of 2)]
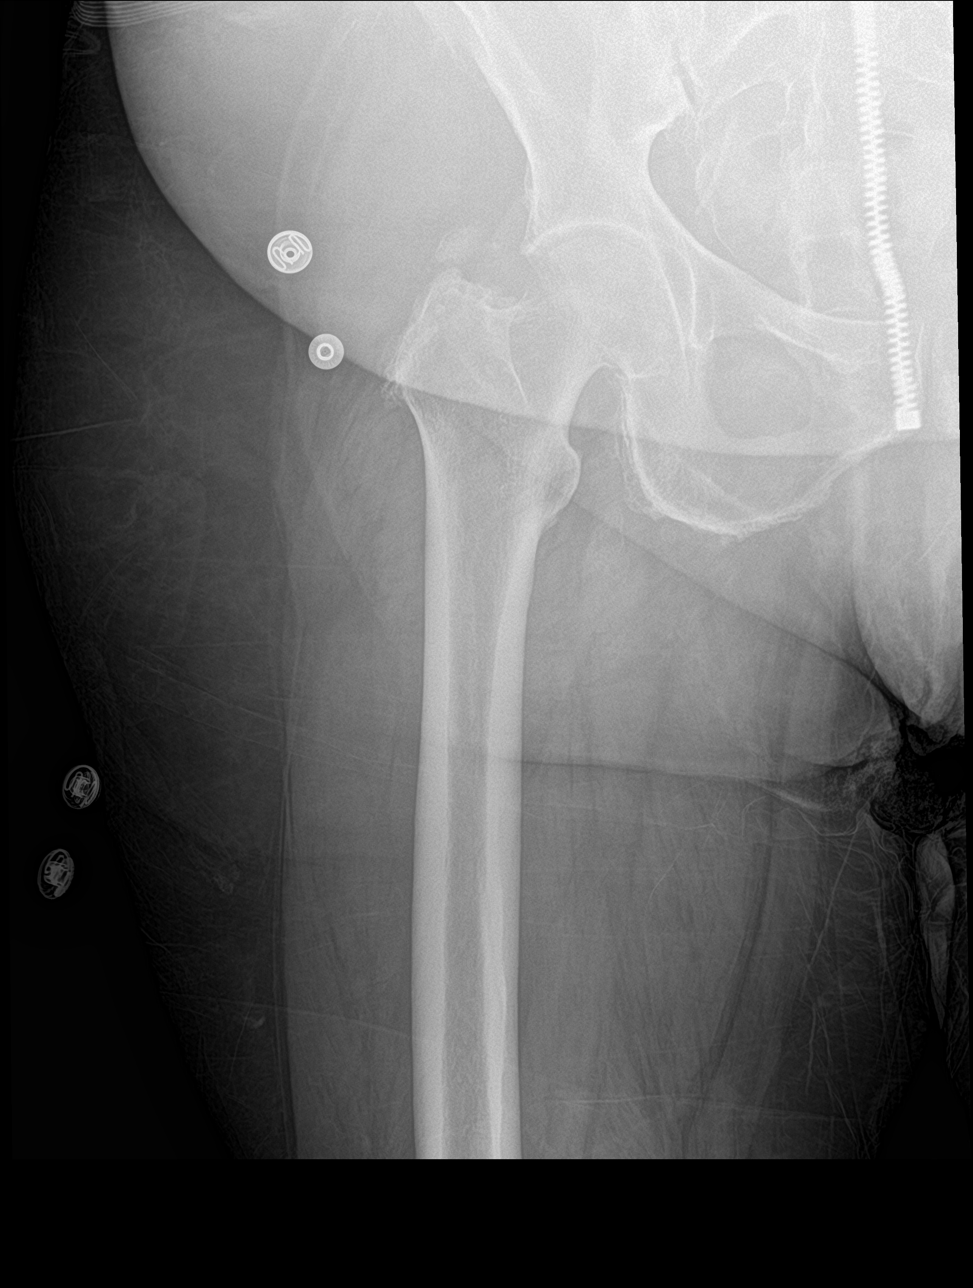

[femur ap (2 of 2)]
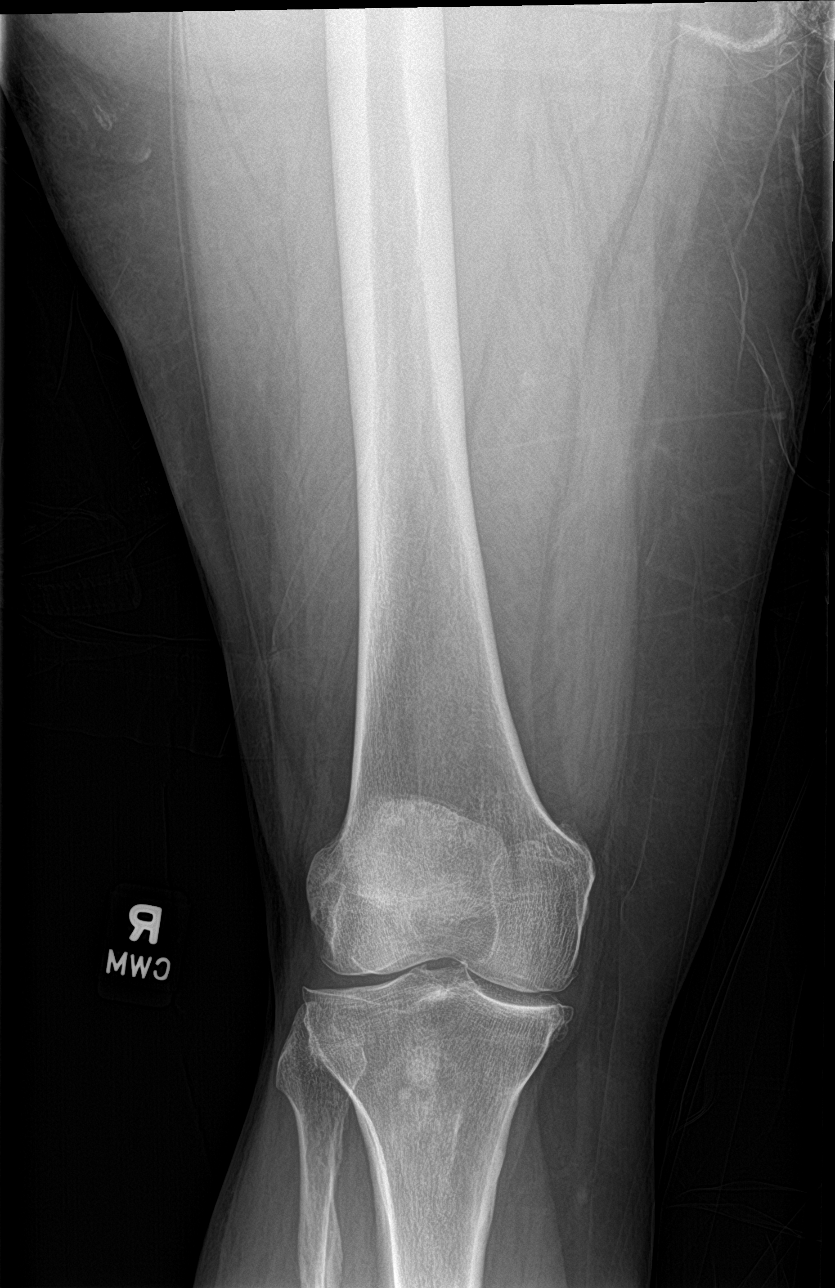

[femur lat (1 of 2)]
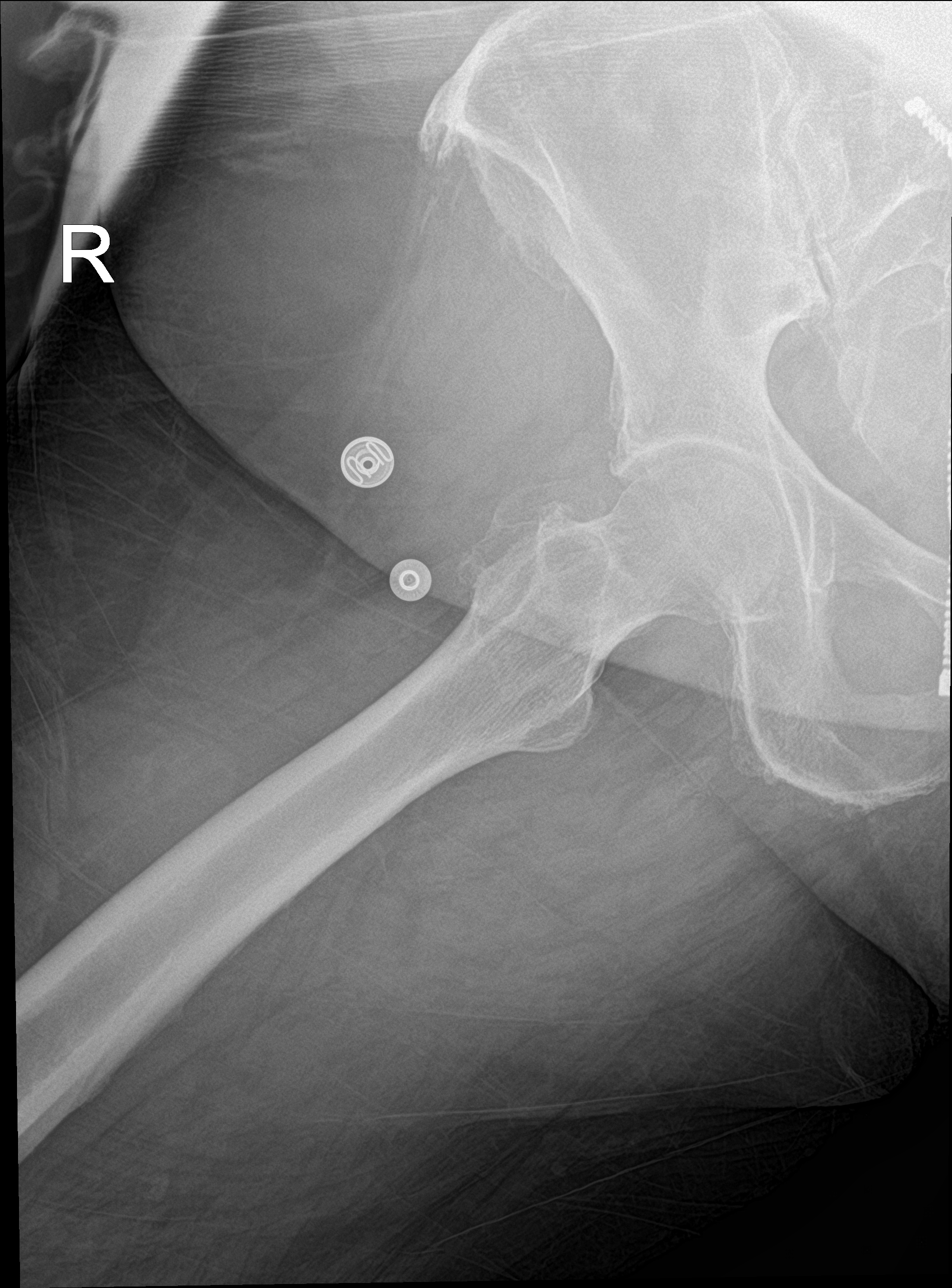

[femur lat (2 of 2)]
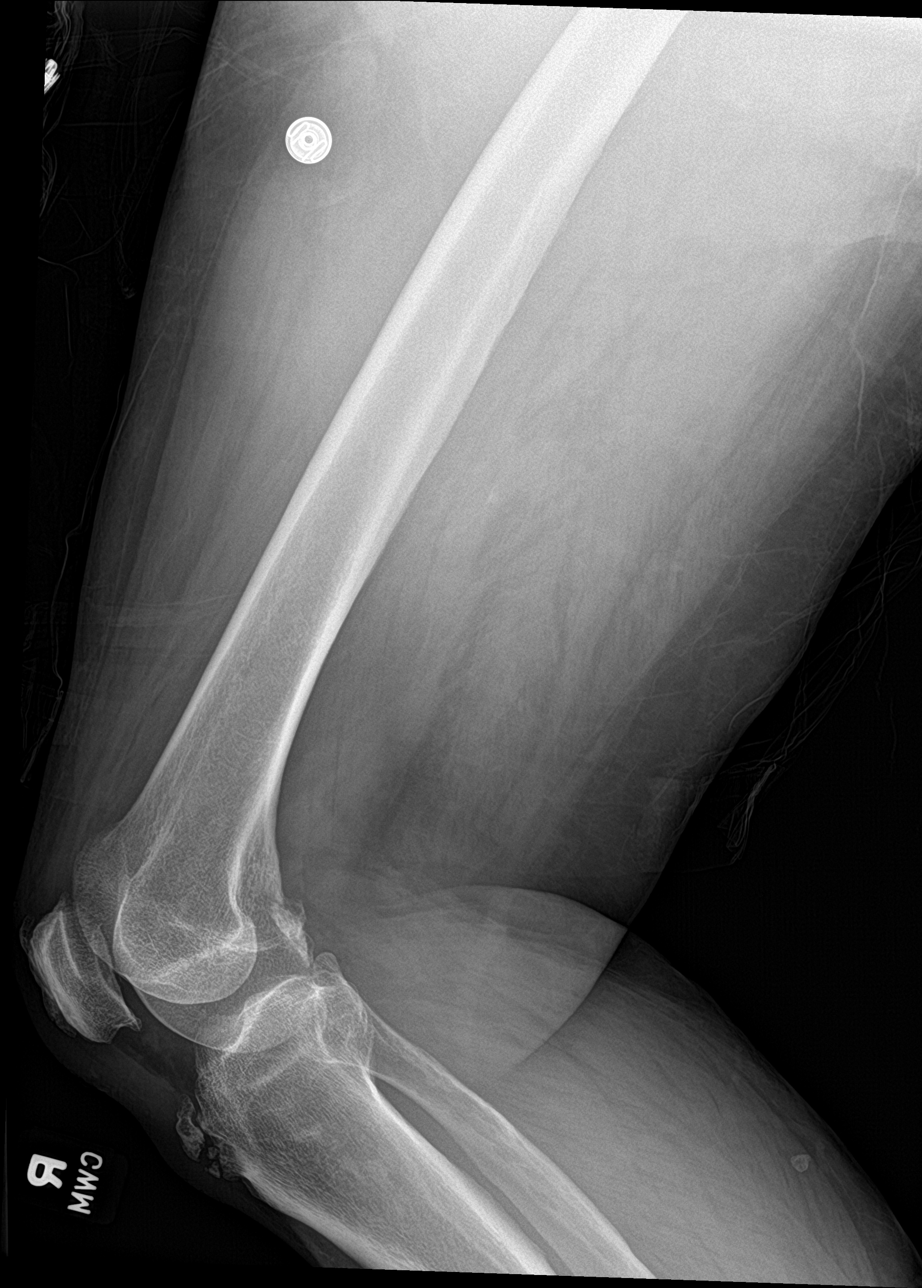

[4 of 4 positions shown; findings below may reference images not displayed]

FINDINGS: There is no evidence of fracture or other focal bone lesions. Soft
tissues are unremarkable.
IMPRESSION: Negative.

## 2021-05-11 ENCOUNTER — Other Ambulatory Visit: Payer: Self-pay | Admitting: Internal Medicine

## 2021-05-11 DIAGNOSIS — I2510 Atherosclerotic heart disease of native coronary artery without angina pectoris: Secondary | ICD-10-CM

## 2021-05-20 ENCOUNTER — Encounter: Payer: Self-pay | Admitting: Internal Medicine

## 2021-05-20 ENCOUNTER — Other Ambulatory Visit: Payer: Self-pay | Admitting: Internal Medicine

## 2021-05-20 ENCOUNTER — Ambulatory Visit (HOSPITAL_COMMUNITY)
Admission: EM | Admit: 2021-05-20 | Discharge: 2021-05-20 | Disposition: A | Discharge status: Home / Self Care | Payer: Self-pay | Attending: Emergency Medicine | Admitting: Emergency Medicine

## 2021-05-20 ENCOUNTER — Other Ambulatory Visit (HOSPITAL_COMMUNITY): Payer: Self-pay

## 2021-05-20 ENCOUNTER — Encounter (HOSPITAL_COMMUNITY): Payer: Self-pay

## 2021-05-20 DIAGNOSIS — R051 Acute cough: Secondary | ICD-10-CM

## 2021-05-20 MED ORDER — AMOXICILLIN-POT CLAVULANATE 875-125 MG PO TABS: 1.0000 | ORAL_TABLET | Freq: Two times a day (BID) | ORAL | 0 refills | Status: AC

## 2021-05-20 MED ORDER — PREDNISONE 10 MG PO TABS: 20.0000 mg | ORAL_TABLET | Freq: Two times a day (BID) | ORAL | 0 refills | Status: AC

## 2021-05-20 NOTE — ED Provider Notes (Signed)
[place]  ____________________________________________  Time seen: Approximately 8:29 PM  I have reviewed the triage vital signs and the nursing notes.   HISTORY  Chief Complaint Cough and Shortness of Breath   Historian Patient     HPI Kendra Adams is a 61 y.o. female presents to the urgent care with cough and nasal congestion for approximately 1 week.  Patient does have a history of COPD and asthma.  She states that her blood glucose levels have been in the 150s.  She states that she does have some shortness of breath with prolonged exertion.  No chest tightness or chest pain.  She denies fever or chills.  No lower extremity swelling.  No recent travel, prolonged immobilization or recent surgery.   Past Medical History:  Diagnosis Date   Allergic rhinitis    Asthma    Atopic dermatitis    CAD (coronary artery disease)    a. 12/13/10: mRCA 99%, EF 65-70%;  s/p BMS to mRCA   COPD (chronic obstructive pulmonary disease) (HCC)    Depression    DM2 (diabetes mellitus, type 2) (HCC)    GERD (gastroesophageal reflux disease)    GI bleed    HTN (hypertension)    Hyperlipidemia    Hyperlipidemia    Hypothyroidism    Psoriasis    Seasonal allergies    Tobacco abuse      Immunizations up to date:  Yes.     Past Medical History:  Diagnosis Date   Allergic rhinitis    Asthma    Atopic dermatitis    CAD (coronary artery disease)    a. 12/13/10: mRCA 99%, EF 65-70%;  s/p BMS to mRCA   COPD (chronic obstructive pulmonary disease) (HCC)    Depression    DM2 (diabetes mellitus, type 2) (HCC)    GERD (gastroesophageal reflux disease)    GI bleed    HTN (hypertension)    Hyperlipidemia    Hyperlipidemia    Hypothyroidism    Psoriasis    Seasonal allergies    Tobacco abuse     Patient Active Problem List   Diagnosis Date Noted   Iron deficiency 01/02/2021   Encounter for cervical Pap smear with pelvic exam 01/02/2021   Osteoarthritis of left  knee    History of UTI 06/13/2020   Need for vaccination against hepatitis B virus 12/02/2018   Hypocalcemia 12/02/2018   Ulcerative colitis ([place]) 09/22/2018   Anemia 09/22/2018   Aortic atherosclerosis ([place]) 09/12/2018   Hip pain 05/27/2017   Dyspareunia, female 04/06/2017   History of restless legs syndrome 11/24/2016   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   Psoriasis 10/15/2015   Tendinitis of left rotator cuff 01/19/2015   Tobacco use disorder 09/07/2014   Morbid obesity with BMI of 40.0-44.9, adult ([place]) 03/19/2011   Routine health maintenance 02/03/2011   CAD (coronary artery disease) 12/26/2010   Diabetes mellitus type 2 with complications ([place]) 26/94/8546   Hypertension associated with diabetes ([place]) 05/10/2008   Hypothyroidism 04/27/2008   HLD (hyperlipidemia) 04/27/2008   Depression 04/27/2008   GERD 04/27/2008    Past Surgical History:  Procedure Laterality Date   BIOPSY  10/28/2018   Procedure: BIOPSY;  Surgeon: Juanita Craver, MD;  Location: [facility];  Service: Endoscopy;;   CARDIAC CATHETERIZATION N/A 08/14/2015   Procedure: Left Heart Cath and Coronary Angiography;  Surgeon: Peter M Martinique, MD;  Location: [place] [facility];  Service: Cardiovascular;  Laterality: N/A;   CARPAL TUNNEL RELEASE  w/ bone spurs   CHOLECYSTECTOMY N/A 08/15/2015   Procedure: LAPAROSCOPIC CHOLECYSTECTOMY WITH ATTEMPTED INTRAOPERATIVE CHOLANGIOGRAM;  Surgeon: Erroll Luna, MD;  Location: [place];  Service: General;  Laterality: N/A;   COLONOSCOPY WITH PROPOFOL N/A 10/28/2018   Procedure: COLONOSCOPY WITH PROPOFOL;  Surgeon: Juanita Craver, MD;  Location: [facility];  Service: Endoscopy;  Laterality: N/A;   CORONARY ANGIOPLASTY WITH STENT PLACEMENT     DILATION AND CURETTAGE OF UTERUS     LEFT HEART CATH AND CORONARY ANGIOGRAPHY N/A 11/23/2019   Procedure: LEFT HEART CATH AND CORONARY ANGIOGRAPHY;  Surgeon: Burnell Blanks, MD;  Location: [place] [facility];   Service: Cardiovascular;  Laterality: N/A;    Prior to Admission medications   Medication Sig Start Date End Date Taking? Authorizing Provider  amoxicillin-clavulanate (AUGMENTIN) 875-125 MG tablet Take 1 tablet by mouth 2 (two) times daily for 7 days. 05/20/21 05/27/21 Yes Vallarie Mare M, PA-C  predniSONE (DELTASONE) 10 MG tablet Take 2 tablets (20 mg total) by mouth in the morning and at bedtime for 5 days. 05/20/21 05/25/21 Yes Vallarie Mare M, PA-C  ACCU-CHEK GUIDE test strip USE  STRIP TO CHECK GLUCOSE ONCE DAILY 05/24/20   [name], MD  Accu-Chek Softclix Lancets lancets USE   TO CHECK GLUCOSE ONCE DAILY 05/24/20   [name], MD  acetaminophen (TYLENOL) 325 MG tablet Take 2 tablets (650 mg total) by mouth every 6 (six) hours as needed for mild pain (or Fever >/= 101). 06/14/20   Cato Mulligan, MD  Adalimumab 40 MG/0.4ML PSKT Inject 40 mg into the skin every 14 (fourteen) days.    [provider]  albuterol (PROVENTIL HFA;VENTOLIN HFA) 108 (90 Base) MCG/ACT inhaler Inhale 1-2 puffs into the lungs every 6 (six) hours as needed for wheezing or shortness of breath. 01/01/18   [name], MD  aspirin 81 MG chewable tablet Chew 81 mg by mouth every morning.     [provider]  atorvastatin (LIPITOR) 80 MG tablet Take 1 tablet (80 mg total) by mouth daily. 09/04/20   [name], MD  betamethasone dipropionate (DIPROLENE) 0.05 % cream Apply topically 2 (two) times daily. Patient taking differently: Apply 1 application topically 2 (two) times daily as needed (psoriasis). 02/15/18   [name], MD  buPROPion (WELLBUTRIN SR) 150 MG 12 hr tablet Take 1 tablet (150 mg total) by mouth 2 (two) times daily. 09/04/20   [name], MD  cetirizine (ZYRTEC) 10 MG tablet Take 1 tablet (10 mg total) by mouth daily. 01/02/21   [name], MD  ferrous sulfate 325 (65 FE) MG EC tablet Take 1 tablet by mouth once daily with breakfast 03/07/21   [name], MD  fluticasone [facility]) 50 MCG/ACT nasal spray Place 2 sprays into both nostrils daily. 09/26/19   [place], Nila Nephew D, DO  Insulin Pen Needle 32G X 4 MM MISC Use daily as diretced 01/02/21   [name], MD  levothyroxine (SYNTHROID) 150 MCG tablet Take 1 tablet (150 mcg total) by mouth daily before breakfast. 09/04/20   [name], MD  liraglutide (VICTOZA) 18 MG/3ML SOPN Inject 0.6 mg into the skin daily for 7 days, THEN 1.2 mg daily for 7 days, THEN 1.8 mg daily for 14 days. 02/13/21 06/19/21  [name], MD  losartan (COZAAR) 100 MG tablet Take 1 tablet (100 mg total) by mouth at bedtime. 09/04/20   [name], MD  metFORMIN (GLUCOPHAGE) 1000 MG tablet Take 1 tablet (1,000 mg total) by mouth 2 (two) times  daily. 09/04/20   [name], MD  metoprolol succinate (TOPROL-XL) 100 MG 24 hr tablet Take with or immediately following a meal. 12/25/20   [name], MD  nitroGLYCERIN (NITROSTAT) 0.4 MG SL tablet DISSOLVE ONE TABLET UNDER THE TONGUE EVERY 5 MINUTES AS NEEDED FOR CHEST PAIN.  DO NOT EXCEED A TOTAL OF 3 DOSES IN 15 MINUTES...CALL 911 05/13/21   [name], MD  omeprazole (PRILOSEC) 40 MG capsule Take 1 capsule by mouth once daily 10/26/20   [name], MD  ondansetron (ZOFRAN) 4 MG tablet Take 1 tablet (4 mg total) by mouth every 8 (eight) hours as needed for nausea or vomiting. 11/10/19   Darr, Edison Nasuti, PA-C  rOPINIRole (REQUIP) 2 MG tablet TAKE 1 TABLET BY MOUTH AT BEDTIME 01/30/21   [name], MD  sertraline (ZOLOFT) 100 MG tablet TAKE 1 & 1/2 (ONE & ONE-HALF) TABLETS BY MOUTH ONCE DAILY 01/30/21   [name], MD  Spacer/Aero-Holding Chambers (AEROCHAMBER PLUS) inhaler Use as instructed 02/03/15   Melynda Ripple, MD    Allergies Nsaids, Opium, Tramadol hcl, and Gabapentin  Family History  Problem Relation Age of Onset   Diabetes Mother    Hypertension Mother     Social History Social History   Tobacco Use    Smoking status: Every Day    Packs/day: 0.30    Years: 40.00    Pack years: 12.00    Types: Cigarettes    Last attempt to quit: 05/19/2014    Years since quitting: 7.0   Smokeless tobacco: Never   Tobacco comments:    2 cigs/day.  Vaping Use   Vaping Use: Some days  Substance Use Topics   Alcohol use: No    Alcohol/week: 0.0 standard drinks   Drug use: Yes    Frequency: 7.0 times per week    Types: Marijuana    Comment: Marijuana Use     Review of Systems  Constitutional: No fever/chills Eyes:  No discharge ENT: No upper respiratory complaints. Respiratory: Patient has cough. Gastrointestinal:   No nausea, no vomiting.  No diarrhea.  No constipation. Musculoskeletal: Negative for musculoskeletal pain. Skin: Negative for rash, abrasions, lacerations, ecchymosis.    ____________________________________________   PHYSICAL EXAM:  VITAL SIGNS: ED Triage Vitals  Enc Vitals Group     BP 05/20/21 2000 (!) 160/95     Pulse Rate 05/20/21 2000 90     Resp 05/20/21 2000 18     Temp 05/20/21 2000 98.3 F (36.8 C)     Temp Source 05/20/21 2000 Oral     SpO2 05/20/21 2000 92 %     Weight --      Height --      Head Circumference --      Peak Flow --      Pain Score 05/20/21 2001 8     Pain Loc --      Pain Edu? --      Excl. in [place]? --      Constitutional: Alert and oriented. Well appearing and in no acute distress. Eyes: Conjunctivae are normal. PERRL. EOMI. Head: Atraumatic. ENT:      Nose: No congestion/rhinnorhea.      Mouth/Throat: Mucous membranes are moist.  Neck: No stridor.  No cervical spine tenderness to palpation. Cardiovascular: Normal rate, regular rhythm. Normal S1 and S2.  Good peripheral circulation. Respiratory: Normal respiratory effort without tachypnea or retractions. Lungs CTAB. Good air entry to the bases with no decreased or absent breath sounds Gastrointestinal: Bowel sounds  x 4 quadrants. Soft and nontender to palpation. No guarding or  rigidity. No distention. Musculoskeletal: Full range of motion to all extremities. No obvious deformities noted Neurologic:  Normal for age. No gross focal neurologic deficits are appreciated.  Skin:  Skin is warm, dry and intact. No rash noted. Psychiatric: Mood and affect are normal for age. Speech and behavior are normal.   ____________________________________________   LABS (all labs ordered are listed, but only abnormal results are displayed)  Labs Reviewed - No data to display ____________________________________________  EKG   ____________________________________________  RADIOLOGY  No results found.  ____________________________________________    PROCEDURES  Procedure(s) performed:     Procedures     Medications - No data to display   ____________________________________________   INITIAL IMPRESSION / ASSESSMENT AND PLAN / ED COURSE  Pertinent labs & imaging results that were available during my care of the patient were reviewed by me and considered in my medical decision making (see chart for details).      Assessment and plan COPD exacerbation Cough 61 year old female presents to the urgent care with cough for approximately 1 week following a viral URI that is productive in nature and worse at night.  We will treat patient with both Augmentin and prednisone.  Patient was cautioned that should her cough or shortness of breath worsen at home, she should seek care at local emergency department for further care and management.  She voiced understanding and has easy access to the emergency department.    ____________________________________________  FINAL CLINICAL IMPRESSION(S) / ED DIAGNOSES  Final diagnoses:  Acute cough      NEW MEDICATIONS STARTED DURING THIS VISIT:  ED Discharge Orders          Ordered    amoxicillin-clavulanate (AUGMENTIN) 875-125 MG tablet  2 times daily        05/20/21 2015    predniSONE (DELTASONE) 10 MG  tablet  2 times daily        05/20/21 2015                This chart was dictated using voice recognition software/Dragon. Despite best efforts to proofread, errors can occur which can change the meaning. Any change was purely unintentional.     Lannie Fields, PA-C 05/20/21 2032

## 2021-05-20 NOTE — ED Triage Notes (Signed)
Pt c/o cough, nasal/chest congestion, and SOB x1wk. States the congestion has worsen in the past few days. No distress noted. Speaking in complete sentences ?

## 2021-05-20 NOTE — Discharge Instructions (Signed)
Take 40 mg of prednisone once daily for 5 days. ?Take Augmentin twice daily for the next 7 days. ?

## 2021-05-21 MED ORDER — ALBUTEROL SULFATE HFA 108 (90 BASE) MCG/ACT IN AERS: 1.0000 | INHALATION_SPRAY | Freq: Four times a day (QID) | RESPIRATORY_TRACT | 2 refills | Status: DC | PRN

## 2021-05-21 MED ORDER — ASPIRIN 81 MG PO CHEW: 81.0000 mg | CHEWABLE_TABLET | ORAL | 3 refills | Status: DC

## 2021-05-21 NOTE — Telephone Encounter (Signed)
Please have patient schedule follow up with me. Thanks! ?

## 2021-05-22 ENCOUNTER — Ambulatory Visit (INDEPENDENT_AMBULATORY_CARE_PROVIDER_SITE_OTHER): Payer: Self-pay | Admitting: Internal Medicine

## 2021-05-22 ENCOUNTER — Telehealth: Payer: Self-pay | Admitting: Cardiology

## 2021-05-22 DIAGNOSIS — J449 Chronic obstructive pulmonary disease, unspecified: Secondary | ICD-10-CM | POA: Insufficient documentation

## 2021-05-22 DIAGNOSIS — J22 Unspecified acute lower respiratory infection: Secondary | ICD-10-CM | POA: Insufficient documentation

## 2021-05-22 NOTE — Telephone Encounter (Signed)
Called patient back about message. Patient complaining of chest pain and SOB that comes and goes. Patient stated she has had some congestion that might be causing her symptoms, but she wants to make sure it is not her heart. Patient has history of CAD. Made patient an appointment with DOD, Dr. Johney Frame who patient is suppose to follow up with per recall. Patient does not currently have chest pain, but will go to ED if it comes back. ?

## 2021-05-22 NOTE — Progress Notes (Signed)
?  [facility] Telephone Encounter ?Continuity Care Appointment ? ?HPI:  ?This telephone encounter was created for [name] on 05/22/2021 for the following purpose/cc: Cough.  ? ?Please see the Encounters tab for problem-based Assessment & Plan regarding status of patient's acute and chronic conditions.  ? ?Past Medical History:  ?Past Medical History:  ?Diagnosis Date  ? Allergic rhinitis   ? Asthma   ? Atopic dermatitis   ? CAD (coronary artery disease)   ? a. 12/13/10: mRCA 99%, EF 65-70%;  s/p BMS to mRCA  ? COPD (chronic obstructive pulmonary disease) ([place])   ? Depression   ? DM2 (diabetes mellitus, type 2) ([place])   ? GERD (gastroesophageal reflux disease)   ? GI bleed   ? HTN (hypertension)   ? Hyperlipidemia   ? Hyperlipidemia   ? Hypothyroidism   ? Psoriasis   ? Seasonal allergies   ? Tobacco abuse   ?  ? ?ROS:  ?+ cough, intermittent CP (resolved), SOB (resolved), diarrhea  ?- N/V, dizziness  ? ?Assessment / Plan / Recommendations:  ?Please see A&P under problem oriented charting for assessment of the patient's acute and chronic medical conditions.  ?As always, pt is advised that if symptoms worsen or new symptoms arise, they should go to an urgent care facility or to to ER for further evaluation.  ? ?Consent and Medical Decision Making:  ?Patient discussed with Dr. Philipp Ovens ?This is a telephone encounter between Joyice Magda and Jose Persia on 05/22/2021 for cough. The visit was conducted with the patient located at home and Jose Persia at [facility]. The patient's identity was confirmed using their DOB and current address. The patient has consented to being evaluated through a telephone encounter and understands the associated risks (an examination cannot be done and the patient may need to come in for an appointment) / benefits (allows the patient to remain at home, decreasing exposure to coronavirus). I personally spent 23 minutes on medical discussion.   ? ?

## 2021-05-22 NOTE — Telephone Encounter (Signed)
?  Pt c/o of Chest Pain: STAT if CP now or developed within 24 hours ? ?1. Are you having CP right now? no ? ?2. Are you experiencing any other symptoms (ex. SOB, nausea, vomiting, sweating)? Fatigue, discomfort in both arms, sob ? ?3. How long have you been experiencing CP? Last 48 hours ? ?4. Is your CP continuous or coming and going? Comes and goes  ? ?5. Have you taken Nitroglycerin? What she has is expired ??  ?Patient had stent placed in 2012. ?

## 2021-05-23 ENCOUNTER — Ambulatory Visit
Admission: RE | Admit: 2021-05-23 | Discharge: 2021-05-23 | Disposition: A | Discharge status: Home / Self Care | Payer: No Typology Code available for payment source | Source: Ambulatory Visit | Attending: Cardiology | Admitting: Cardiology

## 2021-05-23 ENCOUNTER — Encounter: Payer: Self-pay | Admitting: Cardiology

## 2021-05-23 ENCOUNTER — Other Ambulatory Visit: Payer: Self-pay

## 2021-05-23 ENCOUNTER — Encounter: Payer: Self-pay | Admitting: *Deleted

## 2021-05-23 ENCOUNTER — Ambulatory Visit (INDEPENDENT_AMBULATORY_CARE_PROVIDER_SITE_OTHER): Payer: Self-pay | Admitting: Cardiology

## 2021-05-23 VITALS — BP 122/72 | HR 70 | Ht 63.0 in | Wt 233.6 lb

## 2021-05-23 DIAGNOSIS — E118 Type 2 diabetes mellitus with unspecified complications: Secondary | ICD-10-CM

## 2021-05-23 DIAGNOSIS — Z720 Tobacco use: Secondary | ICD-10-CM

## 2021-05-23 DIAGNOSIS — I2583 Coronary atherosclerosis due to lipid rich plaque: Secondary | ICD-10-CM

## 2021-05-23 DIAGNOSIS — I10 Essential (primary) hypertension: Secondary | ICD-10-CM

## 2021-05-23 DIAGNOSIS — E785 Hyperlipidemia, unspecified: Secondary | ICD-10-CM

## 2021-05-23 DIAGNOSIS — I2510 Atherosclerotic heart disease of native coronary artery without angina pectoris: Secondary | ICD-10-CM

## 2021-05-23 DIAGNOSIS — R079 Chest pain, unspecified: Secondary | ICD-10-CM

## 2021-05-23 DIAGNOSIS — J40 Bronchitis, not specified as acute or chronic: Secondary | ICD-10-CM

## 2021-05-23 MED ORDER — ASPIRIN EC 81 MG PO TBEC: 81.0000 mg | DELAYED_RELEASE_TABLET | Freq: Every day | ORAL | 3 refills | Status: AC

## 2021-05-23 MED ORDER — ISOSORBIDE MONONITRATE ER 30 MG PO TB24: 15.0000 mg | ORAL_TABLET | Freq: Every day | ORAL | 3 refills | Status: DC

## 2021-05-23 NOTE — Patient Instructions (Signed)
Medication Instructions:  ? ?START BACK TAKING ASPIRIN 81 MG BY MOUTH DAILY ? ?START TAKING ISOSORBIDE MONONITRATE (IMDUR) 15 MG BY MOUTH DAILY ? ?*If you need a refill on your cardiac medications before your next appointment, please call your pharmacy* ? ? ?Testing/Procedures: ? ?CHEST X-RAY TO BE DONE AT [place] [facility] ? ? ?Your physician has requested that you have a lexiscan myoview. For further information please visit [email or web address]. Please follow instruction sheet, as given. ? ? ?Follow-Up: ? ?3 MONTHS WITH AN EXTENDER IN THE OFFICE ? ? ? ?

## 2021-05-23 NOTE — Progress Notes (Signed)
?Cardiology Office Note:   ? ?Date:  05/23/2021  ? ?ID:  Kendra Adams, DOB 1960-10-07, MRN [id number] ? ?PCP:  [name], MD ?  ?[facility] Providers ?Cardiologist:  Ena Dawley, MD    ? ?Referring MD: [name], MD  ? ?Chest Pain: CC ? ?History of Present Illness:   ? ?Kendra Adams is a 61 y.o. female with a hx of CAD s/p BMS x2 to mRCA (12/2010), hypertension, hyperlipidemia, hypothyroidism, COPD, asthma, diabetes mellitus type 2, GI bleed, GERD, asthma, psoriasis, and tobacco abuse, here for follow-up and evaluation of chest pain and SOB. Previously a patient of Dr. Meda Coffee. ? ?Kendra Adams was admitted to the hospital 11/23/2019 with progressively worsening chest discomfort radiating to her right arm and neck, associated with nausea and DOE. These were similar to her symptoms prior to her stents placed in 2012. A left heart catheterization was ordered and showed LVEF 55-60%, patent mid RCA stent with minimal restenosis, and mild non-obstructive disease in the circumflex. ? ?Kendra Adams was last seen in cardiology by Ermalinda Barrios, PA-C on 12/21/2019. Her ED visit 12/20/19 was reviewed, Kendra Adams tripped and fell down 5 stairs, landing on her right chest. X-rays were negative. Kendra Adams complained of soreness but no chest tightness. Her smoking was down to 1 cigarette a day. ? ?Kendra Adams called the office 05/22/2021 complaining of intermittent chest pain and SOB over the previous 2 days. Kendra Adams did not try nitroglycerin as hers was expired. Kendra Adams noted congestion that may be causing her symptoms, but wanted to make sure it was not her heart due to her history of CAD.  ? ?Today, the patient states that her chest discomfort is localized to her central chest when it occurs. Her pain radiates to her jaw, running horizontally along her inferior mandible/chin. There is also associated diaphoresis at times. Her episodes have occurred when Kendra Adams is sitting and with exertion. In the last month her chest discomfort has occurred  about 3 times. It does not occur after meals.Typically Kendra Adams tries to stay calm and still while Kendra Adams waits for the symptoms to resolve. When Kendra Adams did take nitroglycerin Kendra Adams believes that her pain resolved. ? ?A few years ago Kendra Adams had a mechanical fall and hit her right chest on several stairs. Since that fall Kendra Adams began feeling the chest discomfort. At times Kendra Adams does feel pain and soreness that Kendra Adams knows is musculoskeletal, as it feels different than the chest discomfort as above. ? ?Her diaphoresis also occurs independently, often suddenly while sitting. This occurred in clinic today while sitting on the exam table. Sometimes her sugar will bottom out and Kendra Adams will develop similar diaphoresis. Kendra Adams denies any syncope. ?  ?Additionally, Kendra Adams complains of constant congestion and post-nasal drip. From her chest, her sputum is milky white to brownish. Kendra Adams also feels pain with deep coughing. Kendra Adams is on amoxicillin and prednisone for sinus infection. ? ?Kendra Adams continues to work on quitting smoking. At this time Kendra Adams has been smoking up to 3 cigarettes a day for the past several months. Kendra Adams also endorses marijuana use.  ? ?Kendra Adams remains compliant with her regimen faithfully. Since beginning Victoza, Kendra Adams has successfully lost some weight. ? ?Kendra Adams denies any palpitations, or peripheral edema. No lightheadedness, headaches, orthopnea, or PND. ? ? ?Past Medical History:  ?Diagnosis Date  ? Allergic rhinitis   ? Asthma   ? Atopic dermatitis   ? CAD (coronary artery disease)   ? a. 12/13/10: mRCA 99%, EF 65-70%;  s/p BMS to mRCA  ?  COPD (chronic obstructive pulmonary disease) ([place])   ? Depression   ? DM2 (diabetes mellitus, type 2) ([place])   ? GERD (gastroesophageal reflux disease)   ? GI bleed   ? HTN (hypertension)   ? Hyperlipidemia   ? Hyperlipidemia   ? Hypothyroidism   ? Psoriasis   ? Seasonal allergies   ? Tobacco abuse   ? ? ?Past Surgical History:  ?Procedure Laterality Date  ? BIOPSY  10/28/2018  ? Procedure: BIOPSY;  Surgeon: Juanita Craver, MD;  Location: [facility];  Service: Endoscopy;;  ? CARDIAC CATHETERIZATION N/A 08/14/2015  ? Procedure: Left Heart Cath and Coronary Angiography;  Surgeon: Peter M Martinique, MD;  Location: [facility];  Service: Cardiovascular;  Laterality: N/A;  ? CARPAL TUNNEL RELEASE    ? w/ bone spurs  ? CHOLECYSTECTOMY N/A 08/15/2015  ? Procedure: LAPAROSCOPIC CHOLECYSTECTOMY WITH ATTEMPTED INTRAOPERATIVE CHOLANGIOGRAM;  Surgeon: Erroll Luna, MD;  Location: [facility];  Service: General;  Laterality: N/A;  ? COLONOSCOPY WITH PROPOFOL N/A 10/28/2018  ? Procedure: COLONOSCOPY WITH PROPOFOL;  Surgeon: Juanita Craver, MD;  Location: [facility];  Service: Endoscopy;  Laterality: N/A;  ? CORONARY ANGIOPLASTY WITH STENT PLACEMENT    ? DILATION AND CURETTAGE OF UTERUS    ? LEFT HEART CATH AND CORONARY ANGIOGRAPHY N/A 11/23/2019  ? Procedure: LEFT HEART CATH AND CORONARY ANGIOGRAPHY;  Surgeon: Burnell Blanks, MD;  Location: [facility];  Service: Cardiovascular;  Laterality: N/A;  ? ? ?Current Medications: ?Current Meds  ?Medication Sig  ? ACCU-CHEK GUIDE test strip USE  STRIP TO CHECK GLUCOSE ONCE DAILY  ? Accu-Chek Softclix Lancets lancets USE   TO CHECK GLUCOSE ONCE DAILY  ? acetaminophen (TYLENOL) 325 MG tablet Take 2 tablets (650 mg total) by mouth every 6 (six) hours as needed for mild pain (or Fever >/= 101).  ? Adalimumab 40 MG/0.4ML PSKT Inject 40 mg into the skin every 14 (fourteen) days.  ? albuterol (VENTOLIN HFA) 108 (90 Base) MCG/ACT inhaler Inhale 1-2 puffs into the lungs every 6 (six) hours as needed for wheezing or shortness of breath.  ? amoxicillin-clavulanate (AUGMENTIN) 875-125 MG tablet Take 1 tablet by mouth 2 (two) times daily for 7 days.  ? aspirin EC 81 MG tablet Take 1 tablet (81 mg total) by mouth daily. Swallow whole.  ? atorvastatin (LIPITOR) 80 MG tablet Take 1 tablet (80 mg total) by mouth daily.  ? buPROPion (WELLBUTRIN SR) 150 MG 12 hr tablet Take 1 tablet (150 mg total) by mouth  2 (two) times daily.  ? cetirizine (ZYRTEC) 10 MG tablet Take 1 tablet (10 mg total) by mouth daily.  ? ferrous sulfate 325 (65 FE) MG EC tablet Take 1 tablet by mouth once daily with breakfast  ? fluticasone (FLONASE) 50 MCG/ACT nasal spray Place 2 sprays into both nostrils daily.  ? Insulin Pen Needle 32G X 4 MM MISC Use daily as diretced  ? isosorbide mononitrate (IMDUR) 30 MG 24 hr tablet Take 0.5 tablets (15 mg total) by mouth daily.  ? levothyroxine (SYNTHROID) 150 MCG tablet Take 1 tablet (150 mcg total) by mouth daily before breakfast.  ? liraglutide (VICTOZA) 18 MG/3ML SOPN Inject 0.6 mg into the skin daily for 7 days, THEN 1.2 mg daily for 7 days, THEN 1.8 mg daily for 14 days.  ? losartan (COZAAR) 100 MG tablet Take 1 tablet (100 mg total) by mouth at bedtime.  ? metFORMIN (GLUCOPHAGE) 1000 MG tablet Take 1 tablet (1,000 mg total)  by mouth 2 (two) times daily.  ? metoprolol succinate (TOPROL-XL) 100 MG 24 hr tablet Take with or immediately following a meal.  ? nitroGLYCERIN (NITROSTAT) 0.4 MG SL tablet DISSOLVE ONE TABLET UNDER THE TONGUE EVERY 5 MINUTES AS NEEDED FOR CHEST PAIN.  DO NOT EXCEED A TOTAL OF 3 DOSES IN 15 MINUTES...CALL 911  ? omeprazole (PRILOSEC) 40 MG capsule Take 1 capsule by mouth once daily  ? predniSONE (DELTASONE) 10 MG tablet Take 2 tablets (20 mg total) by mouth in the morning and at bedtime for 5 days.  ? predniSONE (DELTASONE) 10 MG tablet Take 10 mg by mouth daily with breakfast. Pt takes 4 tablets 40 mg each morning.  ? rOPINIRole (REQUIP) 2 MG tablet TAKE 1 TABLET BY MOUTH AT BEDTIME  ? sertraline (ZOLOFT) 100 MG tablet TAKE 1 & 1/2 (ONE & ONE-HALF) TABLETS BY MOUTH ONCE DAILY  ? Spacer/Aero-Holding Chambers (AEROCHAMBER PLUS) inhaler Use as instructed  ?  ? ?Allergies:   Nsaids, Opium, Tramadol hcl, and Gabapentin  ? ?Social History  ? ?Socioeconomic History  ? Marital status: Married  ?  Spouse name: Not on file  ? Number of children: 0  ? Years of education: 10th grade  ?  Highest education level: Not on file  ?Occupational History  ? Occupation: Conservation officer, nature  ?  Comment: self-employed  ? Occupation: [employer]  ?  Comment: in the past  ?Tobacco Use  ? Smoking status: Every Da

## 2021-05-23 NOTE — Assessment & Plan Note (Addendum)
Kendra Adams states that 2 days ago, she developed shortness of breath, rhinorrhea and a dry cough.  She was experiencing changes in her voice and choking on food, stating it went in the wrong pipe.  She went to an urgent care and discharged on Augmentin for 7 days and prednisone for 5 days.  At this time, her cough has become more productive with green sputum.  She continues to have rhinorrhea but is no longer experiencing shortness of breath.  She notes that her choking sensation has also resolved.  She denies any sore throat at this time.  She endorses diarrhea but states this occurs chronically. ? ?She states that since the visit with urgent care, she developed intermittent chest pain that radiates to her jaw and left forearm, but specifies that it does not include her left bicep.  The pain does not occur with diaphoresis although she is diaphoretic at times.  Chest pain does not occur with exertion.  She contacted her cardiologist office and has an appointment for 3/16. ? ?Kendra Adams notes that she has a pulse oximeter at home as well as a blood pressure cuff.  I requested that she take her vitals:  ? ?O2 saturation on room air: 90 % ?Heart rate: 74  ?Blood pressure: 137/75 ? ?Assessment/plan: ?At this time, I suspect patient has a lower respiratory tract infection, particularly pneumonia, however unable to confirm with chest x-ray given our appointment is via telephone.  On the differential is also bronchitis.  Symptoms are improving with antibiotic and steroid therapy.  At this time, no indication for emergent evaluation.  We discussed extensively red flag signs to monitor for including oxygen saturation below 88%, reoccurrence of shortness of breath or reoccurrence of chest pain.  She expressed understanding. ? ?- Continue Augmentin ?- Continue prednisone ?- If patient fails to continue clinically improving, would consider the addition of azithromycin for atypical coverage given possible history of COPD ?-  Continue home albuterol inhaler every 4 hours as needed ?- Follow-up in person in 1 week ?

## 2021-05-25 ENCOUNTER — Other Ambulatory Visit: Payer: Self-pay | Admitting: Internal Medicine

## 2021-05-25 DIAGNOSIS — E038 Other specified hypothyroidism: Secondary | ICD-10-CM

## 2021-05-27 NOTE — Telephone Encounter (Signed)
Approved synthroid. Please have patient schedule follow up with me in person. Thanks.  ?

## 2021-05-28 ENCOUNTER — Telehealth (HOSPITAL_COMMUNITY): Payer: Self-pay

## 2021-05-28 NOTE — Telephone Encounter (Signed)
Spoke with the patient, detailed instructions given. Asked to call back with any questions. She stated that she understood and would be here for test. S.Gwendloyn Forsee EMTP ?

## 2021-05-28 NOTE — Progress Notes (Signed)
Internal Medicine Clinic Attending ? ?Case discussed with Dr. Charleen Kirks  At the time of the visit.  We reviewed the resident?s history and exam and pertinent patient test results.  I agree with the assessment, diagnosis, and plan of care documented in the resident?s note.  ?

## 2021-05-29 ENCOUNTER — Encounter: Payer: Self-pay | Admitting: Internal Medicine

## 2021-05-30 ENCOUNTER — Other Ambulatory Visit: Payer: Self-pay

## 2021-05-30 ENCOUNTER — Ambulatory Visit (HOSPITAL_COMMUNITY): Payer: Self-pay | Attending: Cardiology

## 2021-05-30 DIAGNOSIS — I2583 Coronary atherosclerosis due to lipid rich plaque: Secondary | ICD-10-CM | POA: Insufficient documentation

## 2021-05-30 DIAGNOSIS — R079 Chest pain, unspecified: Secondary | ICD-10-CM | POA: Insufficient documentation

## 2021-05-30 DIAGNOSIS — I2510 Atherosclerotic heart disease of native coronary artery without angina pectoris: Secondary | ICD-10-CM | POA: Insufficient documentation

## 2021-05-30 LAB — MYOCARDIAL PERFUSION IMAGING
LV dias vol: 98 mL (ref 46–106)
LV sys vol: 46 mL
Nuc Stress EF: 54 %
Peak HR: 94 {beats}/min
Rest HR: 67 {beats}/min
Rest Nuclear Isotope Dose: 10.7 mCi
SDS: 2
SRS: 0
SSS: 2
ST Depression (mm): 0 mm
Stress Nuclear Isotope Dose: 31.5 mCi
TID: 0.95

## 2021-05-30 MED ORDER — TECHNETIUM TC 99M TETROFOSMIN IV KIT
31.5000 | PACK | Freq: Once | INTRAVENOUS | Status: AC | PRN
  Administered 2021-05-30: 31.5 via INTRAVENOUS
  Filled 2021-05-30: qty 32

## 2021-05-30 MED ORDER — TECHNETIUM TC 99M TETROFOSMIN IV KIT
10.7000 | PACK | Freq: Once | INTRAVENOUS | Status: AC | PRN
  Administered 2021-05-30: 10.7 via INTRAVENOUS
  Filled 2021-05-30: qty 11

## 2021-05-30 MED ORDER — REGADENOSON 0.4 MG/5ML IV SOLN
0.4000 mg | Freq: Once | INTRAVENOUS | Status: AC
  Administered 2021-05-30: 0.4 mg via INTRAVENOUS

## 2021-06-26 ENCOUNTER — Encounter: Payer: Self-pay | Admitting: Internal Medicine

## 2021-06-26 ENCOUNTER — Other Ambulatory Visit: Payer: Self-pay

## 2021-06-26 ENCOUNTER — Ambulatory Visit (INDEPENDENT_AMBULATORY_CARE_PROVIDER_SITE_OTHER): Payer: 59 | Admitting: Internal Medicine

## 2021-06-26 VITALS — BP 126/74 | HR 85 | Temp 98.3°F | Ht 63.0 in | Wt 231.6 lb

## 2021-06-26 DIAGNOSIS — F17290 Nicotine dependence, other tobacco product, uncomplicated: Secondary | ICD-10-CM

## 2021-06-26 DIAGNOSIS — I2510 Atherosclerotic heart disease of native coronary artery without angina pectoris: Secondary | ICD-10-CM

## 2021-06-26 DIAGNOSIS — K5190 Ulcerative colitis, unspecified, without complications: Secondary | ICD-10-CM

## 2021-06-26 DIAGNOSIS — E785 Hyperlipidemia, unspecified: Secondary | ICD-10-CM | POA: Diagnosis not present

## 2021-06-26 DIAGNOSIS — E1159 Type 2 diabetes mellitus with other circulatory complications: Secondary | ICD-10-CM | POA: Diagnosis not present

## 2021-06-26 DIAGNOSIS — I10 Essential (primary) hypertension: Secondary | ICD-10-CM

## 2021-06-26 DIAGNOSIS — I152 Hypertension secondary to endocrine disorders: Secondary | ICD-10-CM | POA: Diagnosis not present

## 2021-06-26 DIAGNOSIS — Z6841 Body Mass Index (BMI) 40.0 and over, adult: Secondary | ICD-10-CM

## 2021-06-26 DIAGNOSIS — E038 Other specified hypothyroidism: Secondary | ICD-10-CM

## 2021-06-26 DIAGNOSIS — B3781 Candidal esophagitis: Secondary | ICD-10-CM

## 2021-06-26 DIAGNOSIS — E611 Iron deficiency: Secondary | ICD-10-CM

## 2021-06-26 DIAGNOSIS — N3000 Acute cystitis without hematuria: Secondary | ICD-10-CM

## 2021-06-26 DIAGNOSIS — F17200 Nicotine dependence, unspecified, uncomplicated: Secondary | ICD-10-CM

## 2021-06-26 DIAGNOSIS — R300 Dysuria: Secondary | ICD-10-CM | POA: Diagnosis not present

## 2021-06-26 DIAGNOSIS — E118 Type 2 diabetes mellitus with unspecified complications: Secondary | ICD-10-CM

## 2021-06-26 LAB — POCT URINALYSIS DIPSTICK
Bilirubin, UA: NEGATIVE
Glucose, UA: NEGATIVE
Ketones, UA: NEGATIVE
Nitrite, UA: POSITIVE
Protein, UA: POSITIVE — AB
Spec Grav, UA: 1.02 (ref 1.010–1.025)
Urobilinogen, UA: 0.2 E.U./dL
pH, UA: 5.5 (ref 5.0–8.0)

## 2021-06-26 LAB — GLUCOSE, CAPILLARY: Glucose-Capillary: 145 mg/dL — ABNORMAL HIGH (ref 70–99)

## 2021-06-26 LAB — POCT GLYCOSYLATED HEMOGLOBIN (HGB A1C): Hemoglobin A1C: 6.6 % — AB (ref 4.0–5.6)

## 2021-06-26 MED ORDER — FLUCONAZOLE 200 MG PO TABS: ORAL_TABLET | ORAL | 0 refills | Status: DC

## 2021-06-26 MED ORDER — LOSARTAN POTASSIUM 100 MG PO TABS: 100.0000 mg | ORAL_TABLET | Freq: Every day | ORAL | 3 refills | Status: DC

## 2021-06-26 MED ORDER — NITROFURANTOIN MONOHYD MACRO 100 MG PO CAPS: 100.0000 mg | ORAL_CAPSULE | Freq: Two times a day (BID) | ORAL | 0 refills | Status: DC

## 2021-06-26 MED ORDER — METOPROLOL SUCCINATE ER 100 MG PO TB24: ORAL_TABLET | ORAL | 3 refills | Status: DC

## 2021-06-26 MED ORDER — METFORMIN HCL 1000 MG PO TABS: 1000.0000 mg | ORAL_TABLET | Freq: Two times a day (BID) | ORAL | 3 refills | Status: DC

## 2021-06-26 MED ORDER — ATORVASTATIN CALCIUM 80 MG PO TABS: 80.0000 mg | ORAL_TABLET | Freq: Every day | ORAL | 3 refills | Status: DC

## 2021-06-26 NOTE — Patient Instructions (Addendum)
Ms. Manygoats, ? ?It was a pleasure to see you today. Today we discussed: ? ?Diabetes: Please continue to take victoza as previously prescribed. ? ?Esophageal Candidiasis: I believe you have a fungal infection of your esophagus called esophageal candidiasis. This is likely a complication of your recent antibiotic and steroid use. I have sent a prescription to your pharmacy for fluconazole. Please take two tablets on the first day, then one tablet once a day for a total of 3 weeks. If your symptoms do not improve, please give me a call back.  ? ?Urinary Infection: I have sent a prescription for an antibiotic to your pharmacy. Please take this twice a day for 5 days.  ? ?I am checking blood work today and will call you with the results. Please continue taking all of your other medicines as previously prescribed.  ? ?Follow up with me again in 3 months or sooner if you need anything. If you have any questions or concerns, call our clinic at [phone] or after hours call [phone] and ask for the internal medicine resident on call.  ? ?Thank you! ? ?Dr. Darnell Level  ?

## 2021-06-26 NOTE — Progress Notes (Signed)
?Subjective:  ? ?Patient ID: Kendra Adams female   DOB: 24-Nov-1960 61 y.o.   MRN: [id number] ? ?HPI: ?Kendra Adams is a 61 y.o. female with past medical history outlined below here for follow up of her chronic medical problems as well as acute complaint of sore throat, difficulty swallowing, and dysuria. For the details of today's visit, please refer to the assessment and plan. ? ? ?Past Medical History:  ?Diagnosis Date  ? Allergic rhinitis   ? Asthma   ? Atopic dermatitis   ? CAD (coronary artery disease)   ? a. 12/13/10: mRCA 99%, EF 65-70%;  s/p BMS to mRCA  ? COPD (chronic obstructive pulmonary disease) ([place])   ? Depression   ? DM2 (diabetes mellitus, type 2) ([place])   ? GERD (gastroesophageal reflux disease)   ? GI bleed   ? HTN (hypertension)   ? Hyperlipidemia   ? Hyperlipidemia   ? Hypothyroidism   ? Psoriasis   ? Seasonal allergies   ? Tobacco abuse   ? ?Current Outpatient Medications  ?Medication Sig Dispense Refill  ? fluconazole (DIFLUCAN) 200 MG tablet Take 2 tablets on day one, then take 1 tablet daily thereafter 22 tablet 0  ? nitrofurantoin, macrocrystal-monohydrate, (MACROBID) 100 MG capsule Take 1 capsule (100 mg total) by mouth 2 (two) times daily. 10 capsule 0  ? ACCU-CHEK GUIDE test strip USE  STRIP TO CHECK GLUCOSE ONCE DAILY 50 each 0  ? Accu-Chek Softclix Lancets lancets USE   TO CHECK GLUCOSE ONCE DAILY 100 each 0  ? acetaminophen (TYLENOL) 325 MG tablet Take 2 tablets (650 mg total) by mouth every 6 (six) hours as needed for mild pain (or Fever >/= 101). 30 tablet 0  ? Adalimumab 40 MG/0.4ML PSKT Inject 40 mg into the skin every 14 (fourteen) days.    ? albuterol (VENTOLIN HFA) 108 (90 Base) MCG/ACT inhaler Inhale 1-2 puffs into the lungs every 6 (six) hours as needed for wheezing or shortness of breath. 1 each 2  ? aspirin EC 81 MG tablet Take 1 tablet (81 mg total) by mouth daily. Swallow whole. 90 tablet 3  ? atorvastatin (LIPITOR) 80 MG tablet Take 1 tablet (80 mg total) by  mouth daily. 90 tablet 3  ? betamethasone dipropionate (DIPROLENE) 0.05 % cream Apply topically 2 (two) times daily. (Patient not taking: Reported on 05/23/2021) 45 g 2  ? buPROPion (WELLBUTRIN SR) 150 MG 12 hr tablet Take 1 tablet (150 mg total) by mouth 2 (two) times daily. 180 tablet 3  ? cetirizine (ZYRTEC) 10 MG tablet Take 1 tablet (10 mg total) by mouth daily. 90 tablet 3  ? ferrous sulfate 325 (65 FE) MG EC tablet Take 1 tablet by mouth once daily with breakfast 90 tablet 1  ? fluticasone (FLONASE) 50 MCG/ACT nasal spray Place 2 sprays into both nostrils daily. 16 g 2  ? Insulin Pen Needle 32G X 4 MM MISC Use daily as diretced 100 each 1  ? isosorbide mononitrate (IMDUR) 30 MG 24 hr tablet Take 0.5 tablets (15 mg total) by mouth daily. 45 tablet 3  ? levothyroxine (SYNTHROID) 150 MCG tablet TAKE 1 TABLET BY MOUTH BEFORE BREAKFAST 90 tablet 0  ? liraglutide (VICTOZA) 18 MG/3ML SOPN Inject 0.6 mg into the skin daily for 7 days, THEN 1.2 mg daily for 7 days, THEN 1.8 mg daily for 14 days. 9 mL 1  ? losartan (COZAAR) 100 MG tablet Take 1 tablet (100 mg total) by mouth at bedtime.  90 tablet 3  ? metFORMIN (GLUCOPHAGE) 1000 MG tablet Take 1 tablet (1,000 mg total) by mouth 2 (two) times daily. 180 tablet 3  ? metoprolol succinate (TOPROL-XL) 100 MG 24 hr tablet Take with or immediately following a meal. 90 tablet 3  ? nitroGLYCERIN (NITROSTAT) 0.4 MG SL tablet DISSOLVE ONE TABLET UNDER THE TONGUE EVERY 5 MINUTES AS NEEDED FOR CHEST PAIN.  DO NOT EXCEED A TOTAL OF 3 DOSES IN 15 MINUTES...CALL 911 25 tablet 0  ? omeprazole (PRILOSEC) 40 MG capsule Take 1 capsule by mouth once daily 90 capsule 0  ? ondansetron (ZOFRAN) 4 MG tablet Take 1 tablet (4 mg total) by mouth every 8 (eight) hours as needed for nausea or vomiting. (Patient not taking: Reported on 05/23/2021) 4 tablet 0  ? rOPINIRole (REQUIP) 2 MG tablet TAKE 1 TABLET BY MOUTH AT BEDTIME 90 tablet 3  ? sertraline (ZOLOFT) 100 MG tablet TAKE 1 & 1/2 (ONE &  ONE-HALF) TABLETS BY MOUTH ONCE DAILY 45 tablet 5  ? Spacer/Aero-Holding Chambers (AEROCHAMBER PLUS) inhaler Use as instructed 1 each 2  ? ?No current facility-administered medications for this visit.  ? ?Family History  ?Problem Relation Age of Onset  ? Diabetes Mother   ? Hypertension Mother   ? ?Social History  ? ?Socioeconomic History  ? Marital status: Married  ?  Spouse name: Not on file  ? Number of children: 0  ? Years of education: 10th grade  ? Highest education level: Not on file  ?Occupational History  ? Occupation: Conservation officer, nature  ?  Comment: self-employed  ? Occupation: paper [employer]  ?  Comment: in the past  ?Tobacco Use  ? Smoking status: Every Day  ?  Packs/day: 0.30  ?  Years: 40.00  ?  Pack years: 12.00  ?  Types: Cigarettes  ?  Last attempt to quit: 05/19/2014  ?  Years since quitting: 7.1  ?  Passive exposure: Current  ? Smokeless tobacco: Never  ? Tobacco comments:  ?  3 cigs/day.  ?Vaping Use  ? Vaping Use: Some days  ?Substance and Sexual Activity  ? Alcohol use: No  ?  Alcohol/week: 0.0 standard drinks  ? Drug use: Yes  ?  Frequency: 7.0 times per week  ?  Types: Marijuana  ?  Comment: Marijuana Use  ? Sexual activity: Not on file  ?Other Topics Concern  ? Not on file  ?Social History Narrative  ? Father died of suicide (gunshot) in 31. 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   ?   ? Patient lives with her husband, her husband's niece Tammy who is mentally retarded and her best friend and the friend's daughter and 6 dogs  ?   ?   ?   ?   ? ?Social Determinants of Health  ? ?Financial Resource Strain: Not on file  ?Food Insecurity: Not on file  ?Transportation Needs: Not on file  ?Physical Activity: Not on file  ?Stress: Not on file  ?Social Connections: Not on file  ? ? ?Review of Systems: ?Review of Systems  ?Gastrointestinal:  Positive for abdominal pain. Negative for blood in stool and diarrhea.  ?      Dysphagia  ?Genitourinary:  Positive for dysuria and frequency.   ? ?Objective:  ?Physical Exam: ? ?Vitals:  ? 06/26/21 0855  ?BP: 126/74  ?Pulse: 85  ?Temp: 98.3 ?F (  36.8 ?C)  ?TempSrc: Oral  ?SpO2: 96%  ?Weight: 231 lb 9.6 oz (105.1 kg)  ?Height: 5' 3"  (1.6 m)  ? ? ?Constitutional: NAD, well appearing  ?HEENT: White plaques over her tongue and posterior oropharynx  ?Cardiovascular: RRR, no m/r/g ?Pulmonary/Chest: clear bilaterally, normal effort ?Abdominal: soft, non tender, normal BS ? ? ?Assessment & Plan:  ? ?Ulcerative colitis ([place]) ?Chronic and well-controlled on Humira.  Follows closely with GI.  Denies diarrhea and blood in her stool, but does report mild intermittent abdominal cramping.  CBC checked today was within normal limits aside from a mild leukocytosis in the setting of UTI.  Continue to monitor. ? ?Morbid obesity with BMI of 40.0-44.9, adult ([place]) ?Patient has lost 10 pounds since starting Victoza for diabetes.  She reports decreased appetite on this medication and is happy overall with results.  She is currently taking 1.2 mg daily.  Experienced adverse GI side effects when she tried to uptitrate to the 1.8 mg dosing.  Plan to continue with 1.2 mg daily. ? ?Iron deficiency ?Patient has chronic iron deficiency in the setting of ulcerative colitis.  She is currently taking ferrous sulfate 325 mg daily.  Iron studies checked 6 months ago were within normal limits.  Plan to continue with iron supplementation and repeat iron studies again in 6 months. ? ?Hypertension associated with diabetes ([place]) ?This is chronic and well-controlled on losartan 100 mg daily.  Labs checked today with normal renal function.  Continue current regimen. ? ?HLD (hyperlipidemia) ?Patient is taking atorvastatin 80 mg daily for secondary prevention in the setting of CAD and aortic atherosclerosis.  Lipid panel checked 6 months ago with LDL of 73, near goal.  Continue current regimen. ? ?Esophageal candidiasis ([place]) ?Patient  is presenting today with complaint of sore throat, dysphagia, and pharyngitis.  She reports a bolus sensation in her throat and chest when trying to swallow solid foods.  Her throat feels sore and her voice is

## 2021-06-27 ENCOUNTER — Encounter: Payer: Self-pay | Admitting: Internal Medicine

## 2021-06-27 LAB — CMP14 + ANION GAP
ALT: 13 IU/L (ref 0–32)
AST: 12 IU/L (ref 0–40)
Albumin/Globulin Ratio: 2 (ref 1.2–2.2)
Albumin: 4.4 g/dL (ref 3.8–4.8)
Alkaline Phosphatase: 141 IU/L — ABNORMAL HIGH (ref 44–121)
Anion Gap: 17 mmol/L (ref 10.0–18.0)
BUN/Creatinine Ratio: 10 — ABNORMAL LOW (ref 12–28)
BUN: 8 mg/dL (ref 8–27)
Bilirubin Total: 0.3 mg/dL (ref 0.0–1.2)
CO2: 22 mmol/L (ref 20–29)
Calcium: 9.2 mg/dL (ref 8.7–10.3)
Chloride: 102 mmol/L (ref 96–106)
Creatinine, Ser: 0.82 mg/dL (ref 0.57–1.00)
Globulin, Total: 2.2 g/dL (ref 1.5–4.5)
Glucose: 130 mg/dL — ABNORMAL HIGH (ref 70–99)
Potassium: 4.5 mmol/L (ref 3.5–5.2)
Sodium: 141 mmol/L (ref 134–144)
Total Protein: 6.6 g/dL (ref 6.0–8.5)
eGFR: 81 mL/min/{1.73_m2} (ref >59)

## 2021-06-27 LAB — URINALYSIS, COMPLETE
Bilirubin, UA: NEGATIVE
Glucose, UA: NEGATIVE
Ketones, UA: NEGATIVE
Nitrite, UA: POSITIVE — AB
Specific Gravity, UA: 1.014 (ref 1.005–1.030)
Urobilinogen, Ur: 0.2 mg/dL (ref 0.2–1.0)
pH, UA: 5 (ref 5.0–7.5)

## 2021-06-27 LAB — CBC WITH DIFFERENTIAL/PLATELET
Basophils Absolute: 0.1 10*3/uL (ref 0.0–0.2)
Basos: 1 %
EOS (ABSOLUTE): 0.4 10*3/uL (ref 0.0–0.4)
Eos: 3 %
Hematocrit: 41.9 % (ref 34.0–46.6)
Hemoglobin: 14.3 g/dL (ref 11.1–15.9)
Immature Grans (Abs): 0 10*3/uL (ref 0.0–0.1)
Immature Granulocytes: 0 %
Lymphocytes Absolute: 2.4 10*3/uL (ref 0.7–3.1)
Lymphs: 17 %
MCH: 30.6 pg (ref 26.6–33.0)
MCHC: 34.1 g/dL (ref 31.5–35.7)
MCV: 90 fL (ref 79–97)
Monocytes Absolute: 1.2 10*3/uL — ABNORMAL HIGH (ref 0.1–0.9)
Monocytes: 8 %
Neutrophils Absolute: 10.3 10*3/uL — ABNORMAL HIGH (ref 1.4–7.0)
Neutrophils: 71 %
Platelets: 223 10*3/uL (ref 150–450)
RBC: 4.68 x10E6/uL (ref 3.77–5.28)
RDW: 13.7 % (ref 11.7–15.4)
WBC: 14.3 10*3/uL — ABNORMAL HIGH (ref 3.4–10.8)

## 2021-06-27 LAB — MICROSCOPIC EXAMINATION
Casts: NONE SEEN /lpf
RBC, Urine: NONE SEEN /hpf (ref 0–2)
WBC, UA: >30 /hpf — AB (ref 0–5)

## 2021-06-27 LAB — TSH: TSH: 1.54 u[IU]/mL (ref 0.450–4.500)

## 2021-06-27 NOTE — Assessment & Plan Note (Signed)
Patient has lost 10 pounds since starting Victoza for diabetes.  She reports decreased appetite on this medication and is happy overall with results.  She is currently taking 1.2 mg daily.  Experienced adverse GI side effects when she tried to uptitrate to the 1.8 mg dosing.  Plan to continue with 1.2 mg daily. ?

## 2021-06-27 NOTE — Assessment & Plan Note (Addendum)
Patient is presenting today with complaint of sore throat, dysphagia, and pharyngitis.  She reports a bolus sensation in her throat and chest when trying to swallow solid foods.  Her throat feels sore and her voice is hoarse.  She was recently treated for a COPD exacerbation and lower respiratory tract infection with antibiotics and prednisone.  On exam, she has white plaques on her tongue and posterior oropharynx consistent with thrush.  In the setting of her esophageal symptoms, I am concerned for esophageal candidiasis.  We will treat empirically with an extended course of oral fluconazole. She is immunocompromised on Humira for ulcerative colitis.  She is also presenting today with symptoms of UTI.  Since we are planning for additional antibiotics, will treat for a full 3-week course of oral fluconazole.  If no improvement in symptoms following treatment, I will ask her to follow up with GI for upper endoscopy.  ?-- 400 mg of fluconazole x1 day then 200 mg daily thereafter x 3 weeks  ?

## 2021-06-27 NOTE — Assessment & Plan Note (Signed)
Continues to smoke 1 cigarette daily in the morning.  She is still working toward cessation.  Refilled wellbutrin 150 mg BID. ?

## 2021-06-27 NOTE — Assessment & Plan Note (Signed)
This is chronic and well-controlled on losartan 100 mg daily.  Labs checked today with normal renal function.  Continue current regimen. ?

## 2021-06-27 NOTE — Assessment & Plan Note (Signed)
Patient is taking atorvastatin 80 mg daily for secondary prevention in the setting of CAD and aortic atherosclerosis.  Lipid panel checked 6 months ago with LDL of 73, near goal.  Continue current regimen. ?

## 2021-06-27 NOTE — Assessment & Plan Note (Signed)
Chronic and well-controlled, hemoglobin A1c today is 6.5.  Glipizide was stopped at her last visit due to episodes of hypoglycemia and she was started on daily Victoza for the added benefit of weight loss.  She is currently taking 1.2 mg daily and doing very well with this dose.  She has lost 10 pounds since her last visit and is happy with these results.  Unfortunately she experienced adverse GI side effects when she tried to uptitrate to the 1.8 mg dose.  ?-- Continue Victoza 1.2 mg daily ?

## 2021-06-27 NOTE — Assessment & Plan Note (Signed)
Patient has chronic iron deficiency in the setting of ulcerative colitis.  She is currently taking ferrous sulfate 325 mg daily.  Iron studies checked 6 months ago were within normal limits.  Plan to continue with iron supplementation and repeat iron studies again in 6 months. ?

## 2021-06-27 NOTE — Assessment & Plan Note (Signed)
TSH checked today is within the normal limits.  Continue Synthroid 150 mcg daily. ?

## 2021-06-27 NOTE — Assessment & Plan Note (Signed)
Patient is here with multiple urinary complaints including incomplete bladder emptying, urinary frequency, and pain with urination ongoing now x1 week.  She has a history of UTI complicated by AKI requiring hospitalization and reports the symptoms feel similar.  Urinalysis today is consistent with infection (3+ leukocytes, positive nitrites, >30 wbc, and many bacteria). Her last urine culture grew pansensitive E. Coli. ?-- Macrobid 100 mg twice daily x5 days ?- Follow-up repeat urine culture ?

## 2021-06-27 NOTE — Assessment & Plan Note (Signed)
Chronic and well-controlled on Humira.  Follows closely with GI.  Denies diarrhea and blood in her stool, but does report mild intermittent abdominal cramping.  CBC checked today was within normal limits aside from a mild leukocytosis in the setting of UTI.  Continue to monitor. ?

## 2021-06-28 LAB — URINE CULTURE

## 2021-06-29 ENCOUNTER — Other Ambulatory Visit: Payer: Self-pay | Admitting: Student

## 2021-07-13 ENCOUNTER — Emergency Department (HOSPITAL_COMMUNITY)
Admission: EM | Admit: 2021-07-13 | Discharge: 2021-07-13 | Disposition: A | Discharge status: Home / Self Care | Payer: Commercial Managed Care - HMO | Attending: Emergency Medicine | Admitting: Emergency Medicine

## 2021-07-13 ENCOUNTER — Other Ambulatory Visit: Payer: Self-pay

## 2021-07-13 ENCOUNTER — Emergency Department (HOSPITAL_COMMUNITY): Payer: Commercial Managed Care - HMO

## 2021-07-13 ENCOUNTER — Encounter (HOSPITAL_COMMUNITY): Payer: Self-pay | Admitting: Emergency Medicine

## 2021-07-13 DIAGNOSIS — Z79899 Other long term (current) drug therapy: Secondary | ICD-10-CM | POA: Diagnosis not present

## 2021-07-13 DIAGNOSIS — R5383 Other fatigue: Secondary | ICD-10-CM | POA: Insufficient documentation

## 2021-07-13 DIAGNOSIS — R0789 Other chest pain: Secondary | ICD-10-CM

## 2021-07-13 DIAGNOSIS — J449 Chronic obstructive pulmonary disease, unspecified: Secondary | ICD-10-CM | POA: Diagnosis not present

## 2021-07-13 DIAGNOSIS — E119 Type 2 diabetes mellitus without complications: Secondary | ICD-10-CM | POA: Diagnosis not present

## 2021-07-13 DIAGNOSIS — I119 Hypertensive heart disease without heart failure: Secondary | ICD-10-CM | POA: Diagnosis not present

## 2021-07-13 DIAGNOSIS — R42 Dizziness and giddiness: Secondary | ICD-10-CM | POA: Insufficient documentation

## 2021-07-13 DIAGNOSIS — R000 Tachycardia, unspecified: Secondary | ICD-10-CM | POA: Insufficient documentation

## 2021-07-13 DIAGNOSIS — I2510 Atherosclerotic heart disease of native coronary artery without angina pectoris: Secondary | ICD-10-CM | POA: Insufficient documentation

## 2021-07-13 DIAGNOSIS — Z7984 Long term (current) use of oral hypoglycemic drugs: Secondary | ICD-10-CM | POA: Insufficient documentation

## 2021-07-13 DIAGNOSIS — Z7982 Long term (current) use of aspirin: Secondary | ICD-10-CM | POA: Diagnosis not present

## 2021-07-13 LAB — BASIC METABOLIC PANEL
Anion gap: 10 (ref 5–15)
BUN: 15 mg/dL (ref 8–23)
CO2: 25 mmol/L (ref 22–32)
Calcium: 9.2 mg/dL (ref 8.9–10.3)
Chloride: 104 mmol/L (ref 98–111)
Creatinine, Ser: 1.19 mg/dL — ABNORMAL HIGH (ref 0.44–1.00)
GFR, Estimated: 52 mL/min — ABNORMAL LOW (ref >60)
Glucose, Bld: 180 mg/dL — ABNORMAL HIGH (ref 70–99)
Potassium: 3.8 mmol/L (ref 3.5–5.1)
Sodium: 139 mmol/L (ref 135–145)

## 2021-07-13 LAB — CBC
HCT: 42.3 % (ref 36.0–46.0)
Hemoglobin: 14.2 g/dL (ref 12.0–15.0)
MCH: 31.1 pg (ref 26.0–34.0)
MCHC: 33.6 g/dL (ref 30.0–36.0)
MCV: 92.6 fL (ref 80.0–100.0)
Platelets: 233 10*3/uL (ref 150–400)
RBC: 4.57 MIL/uL (ref 3.87–5.11)
RDW: 13.7 % (ref 11.5–15.5)
WBC: 10.7 10*3/uL — ABNORMAL HIGH (ref 4.0–10.5)
nRBC: 0 % (ref 0.0–0.2)

## 2021-07-13 LAB — TROPONIN I (HIGH SENSITIVITY)
Troponin I (High Sensitivity): 4 ng/L (ref <18)
Troponin I (High Sensitivity): 4 ng/L (ref <18)

## 2021-07-13 MED ORDER — SODIUM CHLORIDE 0.9 % IV BOLUS
1000.0000 mL | Freq: Once | INTRAVENOUS | Status: AC
  Administered 2021-07-13: 1000 mL via INTRAVENOUS

## 2021-07-13 NOTE — Discharge Instructions (Signed)
You have been evaluated for your chest discomfort.  Fortunately EKG and labs as well as chest x-ray did not show any concerning finding.  It is important for you to follow-up closely with your primary care doctor for further assessment.  You may benefit from an outpatient stress test if indicated.  Return to the ER if your symptoms worsen or if you have any other concern. ?

## 2021-07-13 NOTE — ED Provider Notes (Signed)
?[place] DEPT ?Provider Note ? ? ?CSN: [id number] ?Arrival date & time: 07/13/21  1812 ? ?  ? ?History ? ?Chief Complaint  ?Patient presents with  ? Chest Pain  ? ? ?Kendra Adams is a 61 y.o. female. ? ?The history is provided by the patient.  ?Chest Pain ? ?62 year old female significant history of CAD s/p BMS x2 to mRCA (12/2010), diabetes, depression, tobacco use, hypertension, hyperlipidemia, COPD presenting to the ER with complaints of chest pain.  Patient reports she was cleaning out her garage earlier today when she developed pain in her chest.  She described pain as a pressure heartburn sensation in her mid chest, radiates to both the shoulder with bouts of fatigue and dizziness.  She sat down and seems to improved.  She did take sublingual nitro and states now her pain has gone.  She does not endorse any significant fever or chills no productive cough no shortness of breath no nausea or diaphoresis.  She does smoke on occasion.  She mentions she has been seen by her cardiologist last month and was told she is fine.  She also mention having history of ulcerative colitis and sometimes she developed somewhat of a similar symptoms.  No prior PE or DVT.  She is here recommendation of her husband. ? ?Home Medications ?Prior to Admission medications   ?Medication Sig Start Date End Date Taking? Authorizing Provider  ?ACCU-CHEK GUIDE test strip USE  STRIP TO CHECK GLUCOSE ONCE DAILY 05/24/20   [name], MD  ?Accu-Chek Softclix Lancets lancets USE   TO CHECK GLUCOSE ONCE DAILY 05/24/20   [name], MD  ?acetaminophen (TYLENOL) 325 MG tablet Take 2 tablets (650 mg total) by mouth every 6 (six) hours as needed for mild pain (or Fever >/= 101). 06/14/20   Cato Mulligan, MD  ?Adalimumab 40 MG/0.4ML PSKT Inject 40 mg into the skin every 14 (fourteen) days.    [provider]  ?albuterol (VENTOLIN HFA) 108 (90 Base) MCG/ACT inhaler Inhale 1-2 puffs into the  lungs every 6 (six) hours as needed for wheezing or shortness of breath. 05/21/21   [name], MD  ?aspirin EC 81 MG tablet Take 1 tablet (81 mg total) by mouth daily. Swallow whole. 05/23/21   Freada Bergeron, MD  ?atorvastatin (LIPITOR) 80 MG tablet Take 1 tablet (80 mg total) by mouth daily. 06/26/21   [name], MD  ?betamethasone dipropionate (DIPROLENE) 0.05 % cream Apply topically 2 (two) times daily. ?Patient not taking: Reported on 05/23/2021 02/15/18   [name], MD  ?buPROPion [facility] SR) 150 MG 12 hr tablet Take 1 tablet (150 mg total) by mouth 2 (two) times daily. 09/04/20   [name], MD  ?cetirizine (ZYRTEC) 10 MG tablet Take 1 tablet (10 mg total) by mouth daily. 01/02/21   [name], MD  ?ferrous sulfate 325 (65 FE) MG EC tablet Take 1 tablet by mouth once daily with breakfast 03/07/21   [name], MD  ?fluconazole (DIFLUCAN) 200 MG tablet Take 2 tablets on day one, then take 1 tablet daily thereafter 06/26/21   [name], MD  ?fluticasone (FLONASE) 50 MCG/ACT nasal spray Place 2 sprays into both nostrils daily. 09/26/19   Modena Nunnery D, DO  ?Insulin Pen Needle 32G X 4 MM MISC Use daily as diretced 01/02/21   [name], MD  ?isosorbide mononitrate (IMDUR) 30 MG 24 hr tablet Take 0.5 tablets (15 mg total) by mouth daily. 05/23/21   Freada Bergeron, MD  ?  levothyroxine (SYNTHROID) 150 MCG tablet TAKE 1 TABLET BY MOUTH BEFORE BREAKFAST 05/27/21   [name], MD  ?liraglutide (VICTOZA) 18 MG/3ML SOPN Inject 0.6 mg into the skin daily for 7 days, THEN 1.2 mg daily for 7 days, THEN 1.8 mg daily for 14 days. 02/13/21 06/22/21  [name], MD  ?losartan (COZAAR) 100 MG tablet Take 1 tablet (100 mg total) by mouth at bedtime. 06/26/21   [name], MD  ?metFORMIN (GLUCOPHAGE) 1000 MG tablet Take 1 tablet (1,000 mg total) by mouth 2 (two) times daily. 06/26/21   [name], MD  ?metoprolol succinate (TOPROL-XL)  100 MG 24 hr tablet Take with or immediately following a meal. 06/26/21   [name], MD  ?nitrofurantoin, macrocrystal-monohydrate, (MACROBID) 100 MG capsule Take 1 capsule (100 mg total) by mouth 2 (two) times daily. 06/26/21   [name], MD  ?nitroGLYCERIN (NITROSTAT) 0.4 MG SL tablet DISSOLVE ONE TABLET UNDER THE TONGUE EVERY 5 MINUTES AS NEEDED FOR CHEST PAIN.  DO NOT EXCEED A TOTAL OF 3 DOSES IN 15 MINUTES...CALL 911 05/13/21   [name], MD  ?omeprazole (PRILOSEC) 40 MG capsule Take 1 capsule by mouth once daily 10/26/20   [name], MD  ?ondansetron (ZOFRAN) 4 MG tablet Take 1 tablet (4 mg total) by mouth every 8 (eight) hours as needed for nausea or vomiting. ?Patient not taking: Reported on 05/23/2021 11/10/19   Darr, Edison Nasuti, PA-C  ?rOPINIRole (REQUIP) 2 MG tablet TAKE 1 TABLET BY MOUTH AT BEDTIME 01/30/21   [name], MD  ?sertraline (ZOLOFT) 100 MG tablet TAKE 1 & 1/2 (ONE & ONE-HALF) TABLETS BY MOUTH ONCE DAILY 01/30/21   [name], MD  ?Spacer/Aero-Holding Chambers (AEROCHAMBER PLUS) inhaler Use as instructed 02/03/15   Melynda Ripple, MD  ?   ? ?Allergies    ?Nsaids, Opium, Tramadol hcl, and Gabapentin   ? ?Review of Systems   ?Review of Systems  ?Cardiovascular:  Positive for chest pain.  ?All other systems reviewed and are negative. ? ?Physical Exam ?Updated Vital Signs ?BP 103/62 (BP Location: Right Arm)   Pulse (!) 103   Temp 98 ?F (36.7 ?C) (Oral)   Resp 18   SpO2 94%  ?Physical Exam ?Vitals and nursing note reviewed.  ?Constitutional:   ?   General: She is not in acute distress. ?   Appearance: She is well-developed. She is obese.  ?HENT:  ?   Head: Atraumatic.  ?Eyes:  ?   Conjunctiva/sclera: Conjunctivae normal.  ?Cardiovascular:  ?   Rate and Rhythm: Tachycardia present.  ?   Pulses: Normal pulses.  ?   Heart sounds: Normal heart sounds.  ?Pulmonary:  ?   Effort: Pulmonary effort is normal.  ?   Breath sounds: No wheezing, rhonchi or rales.   ?Abdominal:  ?   Palpations: Abdomen is soft.  ?   Tenderness: There is no abdominal tenderness.  ?Musculoskeletal:  ?   Cervical back: Neck supple.  ?   Right lower leg: No edema.  ?   Left lower leg: No edema.  ?Skin: ?   Findings: No rash.  ?Neurological:  ?   Mental Status: She is alert.  ?Psychiatric:     ?   Mood and Affect: Mood normal.  ? ? ?ED Results / Procedures / Treatments   ?Labs ?(all labs ordered are listed, but only abnormal results are displayed) ?Labs Reviewed  ?BASIC METABOLIC PANEL - Abnormal; Notable for the following components:  ?    Result Value  ?  Glucose, Bld 180 (*)   ? Creatinine, Ser 1.19 (*)   ? GFR, Estimated 52 (*)   ? All other components within normal limits  ?CBC - Abnormal; Notable for the following components:  ? WBC 10.7 (*)   ? All other components within normal limits  ?TROPONIN I (HIGH SENSITIVITY)  ?TROPONIN I (HIGH SENSITIVITY)  ? ? ?EKG ?EKG Interpretation ? ?Date/Time:  Saturday Jul 13 2021 18:16:53 EDT ?Ventricular Rate:  109 ?PR Interval:  163 ?QRS Duration: 87 ?QT Interval:  338 ?QTC Calculation: 456 ?R Axis:   18 ?Text Interpretation: Sinus tachycardia Ventricular premature complex Aberrant complex Consider left atrial enlargement Abnormal R-wave progression, early transition SINCE LAST TRACING HEART RATE HAS INCREASED Confirmed by Malvin Johns [phone]) on 07/13/2021 7:38:27 PM ? ?Radiology ?DG Chest 2 View ? ?Result Date: 07/13/2021 ?CLINICAL DATA:  Chest pain. EXAM: CHEST - 2 VIEW COMPARISON:  Chest x-ray 05/23/2021. FINDINGS: The heart size and mediastinal contours are within normal limits. Both lungs are clear. The visualized skeletal structures are unremarkable. IMPRESSION: No active cardiopulmonary disease. Electronically Signed   By: Ronney Asters M.D.   On: 07/13/2021 19:24   ? ?Procedures ?Procedures  ? ? ?Medications Ordered in ED ?Medications  ?sodium chloride 0.9 % bolus 1,000 mL (1,000 mLs Intravenous New Bag/Given 07/13/21 1901)  ? ? ?ED Course/ Medical  Decision Making/ A&P ?  ?                        ?Medical Decision Making ?Amount and/or Complexity of Data Reviewed ?Labs: ordered. ?Radiology: ordered. ? ? ?BP 103/62 (BP Location: Right Arm)   Pulse (!) 103   Temp 98

## 2021-07-13 NOTE — ED Notes (Addendum)
Patient transported to XR. 

## 2021-07-13 NOTE — ED Notes (Signed)
Pt. returned from XR. 

## 2021-07-13 NOTE — ED Triage Notes (Signed)
Patient c/o central chest pain x2 hours. Reports feeling dizzy today. ?

## 2021-07-20 ENCOUNTER — Other Ambulatory Visit: Payer: Self-pay | Admitting: Internal Medicine

## 2021-07-20 DIAGNOSIS — E038 Other specified hypothyroidism: Secondary | ICD-10-CM

## 2021-07-22 ENCOUNTER — Telehealth: Payer: Self-pay | Admitting: Internal Medicine

## 2021-07-22 NOTE — Telephone Encounter (Signed)
Patient sent message via my chart on 07/20/2021 at 7:25 pm.   ? ?Stating Wednesday 10th 11:30 pm I had a fever of 102.1 I took two Tylenol and went to bed. My head was hurting so badly I couldn?t sleep the next morning . It was 98.8 today Saturday 13th I?m back up to101.9 . I don?t feel well but not sick,no diarrhea . Humira is dragging their feet I have now missed 4 Humira shots . I have no idea what is going on with me. Something is not right. Swimmy headed and my head hurts. I just don?t head ?  ?Message forwarded to triage to contact patient. ?

## 2021-07-22 NOTE — Telephone Encounter (Signed)
Call pt to f/u on My Chart message sent to the front office. No answer; left message on self-identified vm to go to UC or call the office if she continues not feeling well. ?

## 2021-07-23 ENCOUNTER — Telehealth: Payer: Self-pay | Admitting: *Deleted

## 2021-07-23 NOTE — Telephone Encounter (Signed)
Call to patient to check on progress.  Unable to speak with patient.  Message left that Clinics had called and to return call as soon as possible as an appointment is available if she needs to be seen. ?

## 2021-08-06 ENCOUNTER — Other Ambulatory Visit: Payer: Self-pay | Admitting: Internal Medicine

## 2021-08-08 NOTE — Telephone Encounter (Signed)
Please have patient schedule follow up with me. Thanks.

## 2021-08-23 NOTE — Progress Notes (Deleted)
Cardiology Office Note:    Date:  08/23/2021   ID:  Kendra Adams, DOB 1960-06-30, MRN [id number]  PCP:  [name], MD   [facility] Providers Cardiologist:  Ena Dawley, MD { Click to update primary MD,subspecialty MD or APP then REFRESH:1}    Referring MD: [name], MD   Chief Complaint: ***  History of Present Illness:    Kendra Adams is a *** 61 y.o. female with a hx of CAD s/p BMS x 2 to mRCA (12/2010), hypertension, hyperlipidemia, hypothyroidism, COPD, asthma, diabetes type 2, GERD, GI bleed, asthma, and tobacco abuse.   Admission 11/23/2019 with symptoms of chest discomfort radiating to her right arm and neck, associated with nausea and DOE and similar to symptoms prior to stents.  Heart catheterization revealed patent mid RCA stent with minimal restenosis and mild nonobstructive disease in the circumflex, LVEF 55 to 60%.  Previously a patient of Dr. Meda Coffee, established with Dr. Johney Frame 05/23/2021.  At that visit she reported intermittent chest pain and SOB over the previous 2 days.  She did not try nitroglycerin, noted congestion that may be causing her symptoms but wanted to make sure it was not her heart.  Pain radiating to her jaw running horizontally along her inferior mandible/chin.  Also associated diaphoresis at times.  Episodes occurring at rest and with exertion.  She had suffered a mechanical fall in 2021 and had occasional chest and rib soreness since that time.  She also would experience times of low blood sugar and becoming diaphoretic. She was advised to start Imdur 15 mg daily. A chest x-ray was ordered due to chest congestion and cough which revealed no evidence of pneumonia or fluid in her lungs. Nuclear stress test revealed low risk with no ischemia. She was advised to follow-up in 3 months.  Today,  Past Medical History:  Diagnosis Date   Allergic rhinitis    Asthma    Atopic dermatitis    CAD (coronary artery disease)    a.  12/13/10: mRCA 99%, EF 65-70%;  s/p BMS to mRCA   COPD (chronic obstructive pulmonary disease) (HCC)    Depression    DM2 (diabetes mellitus, type 2) (HCC)    GERD (gastroesophageal reflux disease)    GI bleed    HTN (hypertension)    Hyperlipidemia    Hyperlipidemia    Hypothyroidism    Psoriasis    Seasonal allergies    Tobacco abuse     Past Surgical History:  Procedure Laterality Date   BIOPSY  10/28/2018   Procedure: BIOPSY;  Surgeon: Juanita Craver, MD;  Location: [facility];  Service: Endoscopy;;   CARDIAC CATHETERIZATION N/A 08/14/2015   Procedure: Left Heart Cath and Coronary Angiography;  Surgeon: Peter M Martinique, MD;  Location: [place] [facility];  Service: Cardiovascular;  Laterality: N/A;   CARPAL TUNNEL RELEASE     w/ bone spurs   CHOLECYSTECTOMY N/A 08/15/2015   Procedure: LAPAROSCOPIC CHOLECYSTECTOMY WITH ATTEMPTED INTRAOPERATIVE CHOLANGIOGRAM;  Surgeon: Erroll Luna, MD;  Location: [place];  Service: General;  Laterality: N/A;   COLONOSCOPY WITH PROPOFOL N/A 10/28/2018   Procedure: COLONOSCOPY WITH PROPOFOL;  Surgeon: Juanita Craver, MD;  Location: [facility];  Service: Endoscopy;  Laterality: N/A;   CORONARY ANGIOPLASTY WITH STENT PLACEMENT     DILATION AND CURETTAGE OF UTERUS     LEFT HEART CATH AND CORONARY ANGIOGRAPHY N/A 11/23/2019   Procedure: LEFT HEART CATH AND CORONARY ANGIOGRAPHY;  Surgeon: Burnell Blanks, MD;  Location: [facility]  INVASIVE CV LAB;  Service: Cardiovascular;  Laterality: N/A;    Current Medications: No outpatient medications have been marked as taking for the 08/26/21 encounter (Appointment) with Ann Maki, Lanice Schwab, NP.     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  Vaping Use   Vaping Use: Some days  Substance and Sexual Activity   Alcohol use: No    Alcohol/week: 0.0 standard drinks of alcohol   Drug use: Yes    Frequency: 7.0 times per week    Types: Marijuana    Comment: Marijuana Use   Sexual activity: Not on file  Other Topics Concern   Not on file  Social History Narrative   Father died of suicide (gunshot) in 33. 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       Patient lives with her husband, her husband's niece Tammy who is mentally retarded and her best friend and the friend's daughter and 6 dogs               Social Determinants of Health   Financial Resource Strain: Not on file  Food Insecurity: Not on file  Transportation Needs: Not on file  Physical Activity: Inactive (04/16/2017)   Exercise Vital Sign    Days of Exercise per Week: 0 days    Minutes of Exercise per Session: 0 min  Stress: No Stress Concern Present (04/16/2017)   Camp of Stress : Not at all  Social Connections: Somewhat Isolated (04/16/2017)   Social Connection and Isolation Panel [NHANES]    Frequency of Communication with Friends and Family: More than three times a week    Frequency of Social Gatherings with Friends and Family: Once a week    Attends Religious Services: Never    Marine scientist or Organizations: No    Attends Music therapist: Never    Marital Status: Married     Family History: The patient's  ***family history includes Diabetes in her mother; Hypertension in her mother.  ROS:   Please see the history of present illness.    *** All other systems reviewed and are negative.  Labs/Other Studies Reviewed:    The following studies were reviewed today:  Leane Call 05/30/21    Findings are consistent with no prior ischemia. The study is low risk.   No ST deviation was noted.   LV perfusion is equivocal. There is no evidence of ischemia.   Left ventricular function is normal. Nuclear stress EF: 54 %. The left ventricular ejection fraction is mildly  decreased (45-54%). End diastolic cavity size is normal. End systolic cavity size is normal.   Prior study not available for comparison.   No evidence of ischemia. There is a focal perfusion defect in the apical inferior, apical lateral, and apical segments that is the same to slightly improved on stress imaging. This may be artifact due to extracardiac tracer activity given normal wall motion in the area.   LHC 11/23/19  Ost Cx to Prox Cx lesion is 20% stenosed. Mid RCA lesion is 10% stenosed. The left ventricular systolic function is normal. LV end diastolic pressure is normal. The left ventricular ejection fraction is 55-65% by visual estimate. There is no mitral valve regurgitation.   1. Patent mid RCA stent with minimal restenosis 2. Mild non-obstructive disease in the Circumflex 3. Normal LV systolic function   Recommendations: Continue medical management of CAD. Explore other causes of chest pain.    CXR 05/25/21  Cardiomediastinal silhouette and pulmonary vasculature are within normal limits.   Lungs are clear.   IMPRESSION: No acute cardiopulmonary process.    Recent Labs: 06/26/2021: ALT 13; TSH 1.540 07/13/2021: BUN 15; Creatinine, Ser 1.19; Hemoglobin 14.2; Platelets 233; Potassium 3.8; Sodium 139  Recent Lipid Panel    Component Value Date/Time   CHOL 138 01/02/2021 0949   TRIG 185 (H) 01/02/2021 0949    HDL 34 (L) 01/02/2021 0949   CHOLHDL 4.1 01/02/2021 0949   CHOLHDL 3.6 09/02/2018 1350   VLDL 19 09/02/2018 1350   LDLCALC 73 01/02/2021 0949   LDLDIRECT 142.4 03/21/2011 0843     Risk Assessment/Calculations:   {Does this patient have ATRIAL FIBRILLATION?:[id number]}       Physical Exam:    VS:  There were no vitals taken for this visit.    Wt Readings from Last 3 Encounters:  07/13/21 231 lb 7.7 oz (105 kg)  06/26/21 231 lb 9.6 oz (105.1 kg)  05/30/21 233 lb (105.7 kg)     GEN: *** Well nourished, well developed in no acute distress HEENT: Normal NECK: No JVD; No carotid bruits CARDIAC: ***RRR, no murmurs, rubs, gallops RESPIRATORY:  Clear to auscultation without rales, wheezing or rhonchi  ABDOMEN: Soft, non-tender, non-distended MUSCULOSKELETAL:  No edema; No deformity. *** pedal pulses, ***bilaterally SKIN: Warm and dry NEUROLOGIC:  Alert and oriented x 3 PSYCHIATRIC:  Normal affect   EKG:  EKG is *** ordered today.  The ekg ordered today demonstrates ***  Diagnoses:    No diagnosis found. Assessment and Plan:     ***          {Are you ordering a CV Procedure (e.g. stress test, cath, DCCV, TEE, etc)?   Press F2        :[id number]}    Medication Adjustments/Labs and Tests Ordered: Current medicines are reviewed at length with the patient today.  Concerns regarding medicines are outlined above.  No orders of the defined types were placed in this encounter.  No orders of the defined types were placed in this encounter.   There are no Patient Instructions on file for this visit.   Signed, Emmaline Life, NP  08/23/2021 1:54 PM    [facility]

## 2021-08-26 ENCOUNTER — Ambulatory Visit: Payer: Commercial Managed Care - HMO | Admitting: Nurse Practitioner

## 2021-08-27 ENCOUNTER — Other Ambulatory Visit: Payer: Self-pay

## 2021-08-27 DIAGNOSIS — K219 Gastro-esophageal reflux disease without esophagitis: Secondary | ICD-10-CM

## 2021-08-27 DIAGNOSIS — E038 Other specified hypothyroidism: Secondary | ICD-10-CM

## 2021-08-27 DIAGNOSIS — E118 Type 2 diabetes mellitus with unspecified complications: Secondary | ICD-10-CM

## 2021-08-27 MED ORDER — SERTRALINE HCL 100 MG PO TABS: ORAL_TABLET | ORAL | 11 refills | Status: DC

## 2021-08-27 MED ORDER — ROPINIROLE HCL 2 MG PO TABS: 2.0000 mg | ORAL_TABLET | Freq: Every day | ORAL | 3 refills | Status: DC

## 2021-08-27 MED ORDER — METFORMIN HCL 1000 MG PO TABS: 1000.0000 mg | ORAL_TABLET | Freq: Two times a day (BID) | ORAL | 3 refills | Status: DC

## 2021-08-27 MED ORDER — OMEPRAZOLE 40 MG PO CPDR: 40.0000 mg | DELAYED_RELEASE_CAPSULE | Freq: Every day | ORAL | 3 refills | Status: DC

## 2021-08-27 MED ORDER — LEVOTHYROXINE SODIUM 150 MCG PO TABS: ORAL_TABLET | ORAL | 3 refills | Status: DC

## 2021-09-05 ENCOUNTER — Ambulatory Visit (INDEPENDENT_AMBULATORY_CARE_PROVIDER_SITE_OTHER): Payer: Commercial Managed Care - HMO

## 2021-09-05 ENCOUNTER — Ambulatory Visit: Payer: Commercial Managed Care - HMO | Admitting: Podiatry

## 2021-09-05 DIAGNOSIS — M2041 Other hammer toe(s) (acquired), right foot: Secondary | ICD-10-CM

## 2021-09-05 DIAGNOSIS — M79671 Pain in right foot: Secondary | ICD-10-CM

## 2021-09-05 DIAGNOSIS — M7751 Other enthesopathy of right foot: Secondary | ICD-10-CM

## 2021-09-05 DIAGNOSIS — L84 Corns and callosities: Secondary | ICD-10-CM | POA: Diagnosis not present

## 2021-09-05 NOTE — Patient Instructions (Signed)
Look for an extra depth or double depth shoe. You can usually get these at places like the [facility]

## 2021-09-08 NOTE — Progress Notes (Signed)
Subjective:   Patient ID: Kendra Adams, female   DOB: 61 y.o.   MRN: [id number]   HPI 61 year old female presents the office today for concerns of a corn on the right second toe on the top.  Start about a year ago.  Cause discomfort.  She states that the toe has become contracted and this started after she injured her toe several years ago.  Reconcile of her shoes causing discomfort.  Secondary concerns at this morning she started noticing right ankle discomfort.  She said it felt like a sprain but she did not recall any injury.  No pain or swelling.  She has no other concerns today.  States her last glucose was 173 and last A1c was 7.   Review of Systems  All other systems reviewed and are negative.  Past Medical History:  Diagnosis Date   Allergic rhinitis    Asthma    Atopic dermatitis    CAD (coronary artery disease)    a. 12/13/10: mRCA 99%, EF 65-70%;  s/p BMS to mRCA   COPD (chronic obstructive pulmonary disease) (HCC)    Depression    DM2 (diabetes mellitus, type 2) (HCC)    GERD (gastroesophageal reflux disease)    GI bleed    HTN (hypertension)    Hyperlipidemia    Hyperlipidemia    Hypothyroidism    Psoriasis    Seasonal allergies    Tobacco abuse     Past Surgical History:  Procedure Laterality Date   BIOPSY  10/28/2018   Procedure: BIOPSY;  Surgeon: Juanita Craver, MD;  Location: [facility];  Service: Endoscopy;;   CARDIAC CATHETERIZATION N/A 08/14/2015   Procedure: Left Heart Cath and Coronary Angiography;  Surgeon: Peter M Martinique, MD;  Location: [place] [facility];  Service: Cardiovascular;  Laterality: N/A;   CARPAL TUNNEL RELEASE     w/ bone spurs   CHOLECYSTECTOMY N/A 08/15/2015   Procedure: LAPAROSCOPIC CHOLECYSTECTOMY WITH ATTEMPTED INTRAOPERATIVE CHOLANGIOGRAM;  Surgeon: Erroll Luna, MD;  Location: [place];  Service: General;  Laterality: N/A;   COLONOSCOPY WITH PROPOFOL N/A 10/28/2018   Procedure: COLONOSCOPY WITH PROPOFOL;  Surgeon: Juanita Craver,  MD;  [facility];  Service: Endoscopy;  Laterality: N/A;   CORONARY ANGIOPLASTY WITH STENT PLACEMENT     DILATION AND CURETTAGE OF UTERUS     LEFT HEART CATH AND CORONARY ANGIOGRAPHY N/A 11/23/2019   Procedure: LEFT HEART CATH AND CORONARY ANGIOGRAPHY;  Surgeon: Burnell Blanks, MD;  Location: [place] [facility];  Service: Cardiovascular;  Laterality: N/A;     Current Outpatient Medications:    ACCU-CHEK GUIDE test strip, USE  STRIP TO CHECK GLUCOSE ONCE DAILY, Disp: 50 each, Rfl: 0   Accu-Chek Softclix Lancets lancets, USE   TO CHECK GLUCOSE ONCE DAILY, Disp: 100 each, Rfl: 0   acetaminophen (TYLENOL) 325 MG tablet, Take 2 tablets (650 mg total) by mouth every 6 (six) hours as needed for mild pain (or Fever >/= 101)., Disp: 30 tablet, Rfl: 0   Adalimumab 40 MG/0.4ML PSKT, Inject 40 mg into the skin every 14 (fourteen) days., Disp: , Rfl:    albuterol (VENTOLIN HFA) 108 (90 Base) MCG/ACT inhaler, Inhale 1-2 puffs into the lungs every 6 (six) hours as needed for wheezing or shortness of breath., Disp: 1 each, Rfl: 2   aspirin EC 81 MG tablet, Take 1 tablet (81 mg total) by mouth daily. Swallow whole., Disp: 90 tablet, Rfl: 3   atorvastatin (LIPITOR) 80 MG tablet, Take 1 tablet (  80 mg total) by mouth daily., Disp: 90 tablet, Rfl: 3   betamethasone dipropionate (DIPROLENE) 0.05 % cream, Apply topically 2 (two) times daily. (Patient not taking: Reported on 05/23/2021), Disp: 45 g, Rfl: 2   buPROPion (WELLBUTRIN SR) 150 MG 12 hr tablet, Take 1 tablet (150 mg total) by mouth 2 (two) times daily., Disp: 180 tablet, Rfl: 3   cetirizine (ZYRTEC) 10 MG tablet, Take 1 tablet (10 mg total) by mouth daily., Disp: 90 tablet, Rfl: 3   ferrous sulfate 325 (65 FE) MG EC tablet, Take 1 tablet by mouth once daily with breakfast, Disp: 90 tablet, Rfl: 1   fluconazole (DIFLUCAN) 200 MG tablet, Take 2 tablets on day one, then take 1 tablet daily thereafter, Disp: 22 tablet, Rfl: 0   fluticasone  (FLONASE) 50 MCG/ACT nasal spray, Place 2 sprays into both nostrils daily., Disp: 16 g, Rfl: 2   Insulin Pen Needle 32G X 4 MM MISC, Use daily as diretced, Disp: 100 each, Rfl: 1   isosorbide mononitrate (IMDUR) 30 MG 24 hr tablet, Take 0.5 tablets (15 mg total) by mouth daily., Disp: 45 tablet, Rfl: 3   levothyroxine (SYNTHROID) 150 MCG tablet, TAKE 1 TABLET BY MOUTH BEFORE BREAKFAST, Disp: 90 tablet, Rfl: 3   liraglutide (VICTOZA) 18 MG/3ML SOPN, Inject 0.6 mg into the skin daily for 7 days, THEN 1.2 mg daily for 7 days, THEN 1.8 mg daily for 14 days., Disp: 9 mL, Rfl: 1   losartan (COZAAR) 100 MG tablet, Take 1 tablet (100 mg total) by mouth at bedtime., Disp: 90 tablet, Rfl: 3   metFORMIN (GLUCOPHAGE) 1000 MG tablet, Take 1 tablet (1,000 mg total) by mouth 2 (two) times daily., Disp: 180 tablet, Rfl: 3   metoprolol succinate (TOPROL-XL) 100 MG 24 hr tablet, Take with or immediately following a meal., Disp: 90 tablet, Rfl: 3   nitrofurantoin, macrocrystal-monohydrate, (MACROBID) 100 MG capsule, Take 1 capsule (100 mg total) by mouth 2 (two) times daily., Disp: 10 capsule, Rfl: 0   nitroGLYCERIN (NITROSTAT) 0.4 MG SL tablet, DISSOLVE ONE TABLET UNDER THE TONGUE EVERY 5 MINUTES AS NEEDED FOR CHEST PAIN.  DO NOT EXCEED A TOTAL OF 3 DOSES IN 15 MINUTES...CALL 911, Disp: 25 tablet, Rfl: 0   omeprazole (PRILOSEC) 40 MG capsule, Take 1 capsule (40 mg total) by mouth daily., Disp: 90 capsule, Rfl: 3   ondansetron (ZOFRAN) 4 MG tablet, Take 1 tablet (4 mg total) by mouth every 8 (eight) hours as needed for nausea or vomiting. (Patient not taking: Reported on 05/23/2021), Disp: 4 tablet, Rfl: 0   rOPINIRole (REQUIP) 2 MG tablet, Take 1 tablet (2 mg total) by mouth at bedtime., Disp: 90 tablet, Rfl: 3   sertraline (ZOLOFT) 100 MG tablet, Take 1 and 1/2 tablets by mouth once daily, Disp: 45 tablet, Rfl: 11   Spacer/Aero-Holding Chambers (AEROCHAMBER PLUS) inhaler, Use as instructed, Disp: 1 each, Rfl:  2  Allergies  Allergen Reactions   Nsaids     IBU - Rectal bleeds; states ASA & Tylenol OK   Opium Nausea And Vomiting   Tramadol Hcl     "Just doesn't do anything for me"   Gabapentin Swelling and Rash    Skin rash, lip swelling           Objective:  Physical Exam  General: AAO x3, NAD  Dermatological: Preulcerative hyperkeratotic lesion on the dorsal aspect of right second PIPJ given significant hammertoe deformity.  There is no underlying ulceration noted.  There is  no erythema, drainage or pus or signs of infection.  No open lesions.  Vascular: Dorsalis Pedis artery and Posterior Tibial artery pedal pulses are palpable bilateral with immedate capillary fill time. There is no pain with calf compression, swelling, warmth, erythema.   Neruologic: Grossly intact via light touch bilateral.   Musculoskeletal: Significant hammertoe deformity on the right foot most notably on the second digit.  This is resulted in the hyperkeratotic lesion.  Mild tenderness to the right anterior ankle joint there is no pain or crepitation ankle joint range of motion.  No area pinpoint tenderness.  Muscular strength 5/5 in all groups tested bilateral.  Gait: Unassisted, Nonantalgic.       Assessment:   61 year old female symptomatic hammertoe deformity, preulcerative callus; ankle capsulitis     Plan:  -Treatment options discussed including all alternatives, risks, and complications -Etiology of symptoms were discussed -X-rays were obtained and reviewed with the patient. 3 views of the right foot and ankle were obtained.  No evidence of acute fracture.  Bunion, hammertoes present.  There is no evidence of acute fracture or osteomyelitis. -Discussed with conservative as well as surgical management for the toes. -Debrided hyperkeratotic tissue with any complications or bleeding.  Dispensed offloading pads.  Discussed shoe modification including extra-depth or double depth shoes. -For the ankle  continue supportive shoe gear.  Topical anti-inflammatories as needed.  Trula Slade DPM

## 2021-09-10 NOTE — Progress Notes (Signed)
Cardiology Office Note:    Date:  09/12/2021   ID:  [name], DOB 02-02-1961, MRN [id number]  PCP:  [name], MD   [facility] Providers Cardiologist:  Ena Dawley, MD     Referring MD: [name], MD   Chief Complaint: lightheadedness  History of Present Illness:    Kendra Adams is a very pleasant 61 y.o. female with a hx of CAD s/p BMS x 2 to mRCA (12/2010), hypertension, hyperlipidemia, hypothyroidism, COPD, asthma, diabetes type 2, GERD, GI bleed, asthma, and tobacco abuse.    Admission 11/23/2019 with symptoms of chest discomfort radiating to her right arm and neck, associated with nausea and DOE and similar to symptoms prior to stents.  Heart catheterization revealed patent mid RCA stent with minimal restenosis and mild nonobstructive disease in the circumflex, LVEF 55 to 60%.   Previously a patient of Dr. Meda Coffee, established with Dr. Johney Frame 05/23/2021. At that visit she reported intermittent chest pain and SOB over the previous 2 days. She did not try nitroglycerin, noted congestion that may be causing her symptoms but wanted to make sure it was not her heart.  Pain radiating to her jaw running horizontally along her inferior mandible/chin. Also associated diaphoresis at times. Episodes occurring at rest and with exertion. She had suffered a mechanical fall in 2021 and had occasional chest and rib soreness since that time.  She also would experience times of low blood sugar and becoming diaphoretic. Was advised to start Imdur 15 mg daily. A chest x-ray was ordered due to chest congestion and cough which revealed no evidence of pneumonia or fluid in her lungs. Nuclear stress test revealed low risk with no ischemia. She was advised to follow-up in 3 months.   ER visit 07/13/21 for chest pain while cleaning out the garage earlier that day.  Described as pressure, heartburn sensation in mid chest, radiating to both shoulders with bouts of fatigue and  dizziness.  Rest improved symptoms.  Took SL NTG x1 and pain resolved.  Troponin negative x2, EKG no acute changes, HR elevated at 109 bpm, no acute finding on CXR.    Today, she reports several days of feeling "swimmy headed." Recent medication changes include change from glipizide to Victoza. Thinks she has lost 20 lb. We discussed dietary changes that would benefit heart and diabetes. History of ulcerative colitis, having trouble getting Humira covered by insurance, none since March. Reports chest pain that occurred 5/6 may have been heartburn because she had stopped Humira. One episode of discomfort since 5/6 that was not as intense. Has been taking Imdur faithfully - thinks this is helping. Reports shortness of breath is same as when she saw Dr. Johney Frame in March. Feels that congestion improves when she lies down, is able to expel more phlegm. No edema, orthopnea, or PND. Has reduced cigarette smoking to 5 cigs per day also smokes marijuana, on bupropion. Is stressed taking care of great-nephew who is 33 y/o.   Past Medical History:  Diagnosis Date   Allergic rhinitis    Asthma    Atopic dermatitis    CAD (coronary artery disease)    a. 12/13/10: mRCA 99%, EF 65-70%;  s/p BMS to mRCA   COPD (chronic obstructive pulmonary disease) (HCC)    Depression    DM2 (diabetes mellitus, type 2) (HCC)    GERD (gastroesophageal reflux disease)    GI bleed    HTN (hypertension)    Hyperlipidemia    Hyperlipidemia  Hypothyroidism    Psoriasis    Seasonal allergies    Tobacco abuse     Past Surgical History:  Procedure Laterality Date   BIOPSY  10/28/2018   Procedure: BIOPSY;  Surgeon: Juanita Craver, MD;  [facility];  Service: Endoscopy;;   CARDIAC CATHETERIZATION N/A 08/14/2015   Procedure: Left Heart Cath and Coronary Angiography;  Surgeon: Peter M Martinique, MD;  Location: [place] [facility];  Service: Cardiovascular;  Laterality: N/A;   CARPAL TUNNEL RELEASE     w/ bone spurs    CHOLECYSTECTOMY N/A 08/15/2015   Procedure: LAPAROSCOPIC CHOLECYSTECTOMY WITH ATTEMPTED INTRAOPERATIVE CHOLANGIOGRAM;  Surgeon: Erroll Luna, MD;  Location: [place];  Service: General;  Laterality: N/A;   COLONOSCOPY WITH PROPOFOL N/A 10/28/2018   Procedure: COLONOSCOPY WITH PROPOFOL;  Surgeon: Juanita Craver, MD;  [facility];  Service: Endoscopy;  Laterality: N/A;   CORONARY ANGIOPLASTY WITH STENT PLACEMENT     DILATION AND CURETTAGE OF UTERUS     LEFT HEART CATH AND CORONARY ANGIOGRAPHY N/A 11/23/2019   Procedure: LEFT HEART CATH AND CORONARY ANGIOGRAPHY;  Surgeon: Burnell Blanks, MD;  Location: [place] [facility];  Service: Cardiovascular;  Laterality: N/A;    Current Medications: Current Meds  Medication Sig   ACCU-CHEK GUIDE test strip USE  STRIP TO CHECK GLUCOSE ONCE DAILY   Accu-Chek Softclix Lancets lancets USE   TO CHECK GLUCOSE ONCE DAILY   acetaminophen (TYLENOL) 325 MG tablet Take 2 tablets (650 mg total) by mouth every 6 (six) hours as needed for mild pain (or Fever >/= 101).   Adalimumab 40 MG/0.4ML PSKT Inject 40 mg into the skin every 14 (fourteen) days.   albuterol (VENTOLIN HFA) 108 (90 Base) MCG/ACT inhaler Inhale 1-2 puffs into the lungs every 6 (six) hours as needed for wheezing or shortness of breath.   aspirin EC 81 MG tablet Take 1 tablet (81 mg total) by mouth daily. Swallow whole.   atorvastatin (LIPITOR) 80 MG tablet Take 1 tablet (80 mg total) by mouth daily.   betamethasone dipropionate (DIPROLENE) 0.05 % cream Apply topically 2 (two) times daily.   buPROPion (WELLBUTRIN SR) 150 MG 12 hr tablet Take 1 tablet (150 mg total) by mouth 2 (two) times daily.   cetirizine (ZYRTEC) 10 MG tablet Take 1 tablet (10 mg total) by mouth daily.   ferrous sulfate 325 (65 FE) MG EC tablet Take 1 tablet by mouth once daily with breakfast   fluticasone (FLONASE) 50 MCG/ACT nasal spray Place 2 sprays into both nostrils daily.   Insulin Pen Needle 32G X 4 MM MISC Use  daily as diretced   isosorbide mononitrate (IMDUR) 30 MG 24 hr tablet Take 0.5 tablets (15 mg total) by mouth daily.   levothyroxine (SYNTHROID) 150 MCG tablet TAKE 1 TABLET BY MOUTH BEFORE BREAKFAST   losartan (COZAAR) 100 MG tablet Take 1 tablet (100 mg total) by mouth at bedtime.   meclizine (ANTIVERT) 25 MG tablet Take 25 mg by mouth as needed for dizziness.   metFORMIN (GLUCOPHAGE) 1000 MG tablet Take 1 tablet (1,000 mg total) by mouth 2 (two) times daily.   metoprolol succinate (TOPROL-XL) 100 MG 24 hr tablet Take with or immediately following a meal.   nitrofurantoin, macrocrystal-monohydrate, (MACROBID) 100 MG capsule Take 1 capsule (100 mg total) by mouth 2 (two) times daily.   omeprazole (PRILOSEC) 40 MG capsule Take 1 capsule (40 mg total) by mouth daily.   ondansetron (ZOFRAN) 4 MG tablet Take 1 tablet (4 mg  total) by mouth every 8 (eight) hours as needed for nausea or vomiting.   rOPINIRole (REQUIP) 2 MG tablet Take 1 tablet (2 mg total) by mouth at bedtime.   sertraline (ZOLOFT) 100 MG tablet Take 1 and 1/2 tablets by mouth once daily   Spacer/Aero-Holding Chambers (AEROCHAMBER PLUS) inhaler Use as instructed   [DISCONTINUED] nitroGLYCERIN (NITROSTAT) 0.4 MG SL tablet DISSOLVE ONE TABLET UNDER THE TONGUE EVERY 5 MINUTES AS NEEDED FOR CHEST PAIN.  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  Vaping Use   Vaping Use: Some days  Substance and Sexual Activity   Alcohol use: No    Alcohol/week: 0.0 standard drinks of alcohol   Drug use: Yes    Frequency: 7.0 times per week    Types: Marijuana    Comment: Marijuana Use   Sexual activity: Not on file  Other Topics Concern   Not on file  Social History Narrative   Father died of suicide (gunshot) in 23. 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       Patient lives with her husband, her husband's niece Tammy who is mentally retarded and her best friend and the friend's daughter and 6 dogs               Social Determinants of Health   Financial Resource Strain: Not on file  Food Insecurity: Not on file  Transportation Needs: Not on file  Physical Activity: Inactive (04/16/2017)   Exercise Vital Sign    Days of Exercise per Week: 0 days    Minutes of Exercise per Session: 0 min  Stress: No Stress Concern Present (04/16/2017)   [place] of Stress : Not at all  Social Connections: Somewhat Isolated (04/16/2017)   Social Connection and Isolation Panel [NHANES]    Frequency of Communication with Friends and Family: More than three times a week    Frequency of Social Gatherings with Friends and Family: Once a week    Attends Religious Services: Never    Marine scientist or Organizations: No    Attends Music therapist: Never    Marital Status: Married     Family History: The patient's family history includes Diabetes in her mother; Hypertension in her mother.  ROS:   Please see the history of present illness.    + lightheaded All other systems reviewed and are negative.  Labs/Other Studies Reviewed:    The following studies  were reviewed  today:  Leane Call 05/30/21    Findings are consistent with no prior ischemia. The study is low risk.   No ST deviation was noted.   LV perfusion is equivocal. There is no evidence of ischemia.   Left ventricular function is normal. Nuclear stress EF: 54 %. The left ventricular ejection fraction is mildly decreased (45-54%). End diastolic cavity size is normal. End systolic cavity size is normal.   Prior study not available for comparison.   No evidence of ischemia. There is a focal perfusion defect in the apical inferior, apical lateral, and apical segments that is the same to slightly improved on stress imaging. This may be artifact due to extracardiac tracer activity given normal wall motion in the area.  CXR 05/25/21  Cardiomediastinal silhouette and pulmonary vasculature are within normal limits.   Lungs are clear.   LHC 11/2019  IMPRESSION: No acute cardiopulmonary process.    Ost Cx to Prox Cx lesion is 20% stenosed. Mid RCA lesion is 10% stenosed. The left ventricular systolic function is normal. LV end diastolic pressure is normal. The left ventricular ejection fraction is 55-65% by visual estimate. There is no mitral valve regurgitation.   1. Patent mid RCA stent with minimal restenosis 2. Mild non-obstructive disease in the Circumflex 3. Normal LV systolic function   Recommendations: Continue medical management of CAD. Explore other causes of chest pain.     Diagnostic Dominance: Right     Recent Labs: 06/26/2021: ALT 13; TSH 1.540 07/13/2021: BUN 15; Creatinine, Ser 1.19; Hemoglobin 14.2; Platelets 233; Potassium 3.8; Sodium 139   Recent Lipid Panel    Component Value Date/Time   CHOL 138 01/02/2021 0949   TRIG 185 (H) 01/02/2021 0949   HDL 34 (L) 01/02/2021 0949   CHOLHDL 4.1 01/02/2021 0949   CHOLHDL 3.6 09/02/2018 1350   VLDL 19 09/02/2018 1350   LDLCALC 73 01/02/2021 0949   LDLDIRECT 142.4 03/21/2011 0843     Risk Assessment/Calculations:        Physical Exam:    VS:  BP (!) 98/58   Pulse 74   Ht 5' 3"  (1.6 m)   Wt 226 lb 9.6 oz (102.8 kg)   SpO2 96%   BMI 40.14 kg/m     Wt Readings from Last 3 Encounters:  09/12/21 226 lb 9.6 oz (102.8 kg)  07/13/21 231 lb 7.7 oz (105 kg)  06/26/21 231 lb 9.6 oz (105.1 kg)     GEN: Well nourished, well developed in no acute distress HEENT: Normal NECK: No JVD; No carotid bruits CARDIAC: RRR, no murmurs, rubs, gallops RESPIRATORY:  Diminished breath sounds bilaterally, expiratory wheeze noted LLL.   ABDOMEN: Soft, non-tender, non-distended MUSCULOSKELETAL:  No edema; No deformity. 2= pedal pulses, equal bilaterally SKIN: Warm and dry NEUROLOGIC:  Alert and oriented x 3 PSYCHIATRIC:  Normal affect   EKG:  EKG is not ordered today.   Diagnoses:    1. Dyspnea on exertion   2. Coronary artery disease involving native coronary artery of native heart without angina pectoris   3. Essential hypertension   4. Tobacco abuse   5. Hyperlipidemia LDL goal <70   6. Morbid obesity ([place])    Assessment and Plan:     Chest pain: One episode of chest discomfort recently that she felt was heartburn. No recurrence. Symptoms are not exertional, do not change with position. Also endorses DOE. She does not exercise on a consistent basis but is active doing house and yard work.  Takes care of a young nephew. Low risk nuclear stress test 05/30/21.  As noted below, we will get echocardiogram to evaluate heart function.  Continue Imdur 15 mg, up titration limited by soft BP. Advised her to notify us if chest pain worsens prior to next office visit.   DOE: Recent cough treated with prednisone and augmentin. Cough has improved, but she continues to have DOE. History of COPD, she continues to smoke. No edema, orthopnea, PND. Recent weight loss of at least 5 lbs. we will get echocardiogram to evaluate for structural heart disease.  CAD without angina: Known hx CAD s/p PCI to RAD 12/2019 with most recent  cath 11/2019 with patent stent and mild LAD disease. Low risk nuclear stress test 05/30/21.  Has felt improvement since starting isosorbide. Will evaluate echo for abnormal wall motion.  Continue aspirin, atorvastatin, isosorbide, losartan, metoprolol. We will refill SL NTG today.   Morbid obesity: Now on Victoza with successful weight loss of 5 lbs by our scale, she thinks total of 20 lbs since starting. Continues to drink multiples sodas daily and unrestricted diet. We discussed the importance of substituting or removing one non-nutritious food from her diet at the time. Continue Victoza.   Hypertension: BP is soft today. She is feeling lightheaded. We will reduce losartan to 50 mg daily. Recommended that she continue to monitor BP at least 2 hours after medication. Report if lightheadedness does not improve or if BP consistently > 130/80.   Hyperlipidemia LDL goal < 70: LDL 73 01/02/21.  Discussed the importance of heart healthy, low-sodium diet. We will recheck lipids in a few weeks when she returns for echo.   Tobacco abuse: Complete cessation advised  Disposition: 6 months with Dr. Johney Frame    Medication Adjustments/Labs and Tests Ordered: Current medicines are reviewed at length with the patient today.  Concerns regarding medicines are outlined above.  Orders Placed This Encounter  Procedures   Lipid Profile   Comp Met (CMET)   ECHOCARDIOGRAM COMPLETE   Meds ordered this encounter  Medications   nitroGLYCERIN (NITROSTAT) 0.4 MG SL tablet    Sig: DISSOLVE ONE TABLET UNDER THE TONGUE EVERY 5 MINUTES AS NEEDED FOR CHEST PAIN.  DO NOT EXCEED A TOTAL OF 3 DOSES IN 15 MINUTES...CALL 911    Dispense:  25 tablet    Refill:  3    There are no Patient Instructions on file for this visit.   Signed, Emmaline Life, NP  09/12/2021 10:12 AM    [place]

## 2021-09-12 ENCOUNTER — Ambulatory Visit: Payer: Commercial Managed Care - HMO | Admitting: Nurse Practitioner

## 2021-09-12 ENCOUNTER — Encounter: Payer: Self-pay | Admitting: Nurse Practitioner

## 2021-09-12 VITALS — BP 98/58 | HR 74 | Ht 63.0 in | Wt 226.6 lb

## 2021-09-12 DIAGNOSIS — I152 Hypertension secondary to endocrine disorders: Secondary | ICD-10-CM

## 2021-09-12 DIAGNOSIS — I10 Essential (primary) hypertension: Secondary | ICD-10-CM

## 2021-09-12 DIAGNOSIS — E1159 Type 2 diabetes mellitus with other circulatory complications: Secondary | ICD-10-CM

## 2021-09-12 DIAGNOSIS — E785 Hyperlipidemia, unspecified: Secondary | ICD-10-CM

## 2021-09-12 DIAGNOSIS — R0609 Other forms of dyspnea: Secondary | ICD-10-CM

## 2021-09-12 DIAGNOSIS — I2510 Atherosclerotic heart disease of native coronary artery without angina pectoris: Secondary | ICD-10-CM

## 2021-09-12 DIAGNOSIS — Z720 Tobacco use: Secondary | ICD-10-CM

## 2021-09-12 MED ORDER — LOSARTAN POTASSIUM 100 MG PO TABS: 50.0000 mg | ORAL_TABLET | Freq: Every day | ORAL | 3 refills | Status: DC

## 2021-09-12 MED ORDER — NITROGLYCERIN 0.4 MG SL SUBL: SUBLINGUAL_TABLET | SUBLINGUAL | 3 refills | Status: DC

## 2021-09-12 NOTE — Patient Instructions (Signed)
Medication Instructions:   DECREASE Losartan one half tablet by mouth (50 mg ) daily.   *If you need a refill on your cardiac medications before your next appointment, please call your pharmacy*   Lab Work:  Your physician recommends that you return for a FASTING lipid profile/CMET same day as echo on Wednesday, July 19. You can come in on the day of your appointment anytime between 7:30-4:30 fasting from midnight the night before.   If you have labs (blood work) drawn today and your tests are completely normal, you will receive your results only by: [place] (if you have MyChart) OR A paper copy in the mail If you have any lab test that is abnormal or we need to change your treatment, we will call you to review the results.   Testing/Procedures:  Your physician has requested that you have an echocardiogram. Echocardiography is a painless test that uses sound waves to create images of your heart. It provides your doctor with information about the size and shape of your heart and how well your heart's chambers and valves are working. This procedure takes approximately one hour. There are no restrictions for this procedure.    Follow-Up: At [facility], you and your health needs are our priority.  As part of our continuing mission to provide you with exceptional heart care, we have created designated Provider Care Teams.  These Care Teams include your primary Cardiologist (physician) and Advanced Practice Providers (APPs -  Physician Assistants and Nurse Practitioners) who all work together to provide you with the care you need, when you need it.  We recommend signing up for the patient portal called "MyChart".  Sign up information is provided on this After Visit Summary.  MyChart is used to connect with patients for Virtual Visits (Telemedicine).  Patients are able to view lab/test results, encounter notes, upcoming appointments, etc.  Non-urgent messages can be sent to your  provider as well.   To learn more about what you can do with MyChart, go to [email or web address].    Your next appointment:   6 month(s)  The format for your next appointment:   In Person  Provider:   Gwyndolyn Kaufman, MD  If primary card or EP is not listed click here to update    :1}    Other Instructions  Your physician wants you to follow-up in: 6 months with Dr. Johney Frame.  You will receive a reminder letter in the mail two months in advance. If you don't receive a letter, please call our office to schedule the follow-up appointment.  Please keep checking Blood Pressure if it consistently X 3 stays below 130/80 or light headedness continues please call office at [phone] or send mychart message.   Important Information About Sugar

## 2021-09-18 ENCOUNTER — Ambulatory Visit (INDEPENDENT_AMBULATORY_CARE_PROVIDER_SITE_OTHER): Payer: Self-pay | Admitting: Internal Medicine

## 2021-09-18 ENCOUNTER — Encounter: Payer: Self-pay | Admitting: Internal Medicine

## 2021-09-18 VITALS — BP 107/60 | HR 71 | Ht 63.0 in | Wt 228.2 lb

## 2021-09-18 DIAGNOSIS — I152 Hypertension secondary to endocrine disorders: Secondary | ICD-10-CM

## 2021-09-18 DIAGNOSIS — E611 Iron deficiency: Secondary | ICD-10-CM

## 2021-09-18 DIAGNOSIS — T169XXA Foreign body in ear, unspecified ear, initial encounter: Secondary | ICD-10-CM | POA: Insufficient documentation

## 2021-09-18 DIAGNOSIS — E1159 Type 2 diabetes mellitus with other circulatory complications: Secondary | ICD-10-CM

## 2021-09-18 DIAGNOSIS — F17200 Nicotine dependence, unspecified, uncomplicated: Secondary | ICD-10-CM

## 2021-09-18 DIAGNOSIS — K5190 Ulcerative colitis, unspecified, without complications: Secondary | ICD-10-CM

## 2021-09-18 DIAGNOSIS — J309 Allergic rhinitis, unspecified: Secondary | ICD-10-CM

## 2021-09-18 DIAGNOSIS — Z1231 Encounter for screening mammogram for malignant neoplasm of breast: Secondary | ICD-10-CM

## 2021-09-18 DIAGNOSIS — E118 Type 2 diabetes mellitus with unspecified complications: Secondary | ICD-10-CM

## 2021-09-18 DIAGNOSIS — I10 Essential (primary) hypertension: Secondary | ICD-10-CM

## 2021-09-18 DIAGNOSIS — F17210 Nicotine dependence, cigarettes, uncomplicated: Secondary | ICD-10-CM

## 2021-09-18 DIAGNOSIS — Z7984 Long term (current) use of oral hypoglycemic drugs: Secondary | ICD-10-CM

## 2021-09-18 DIAGNOSIS — T161XXA Foreign body in right ear, initial encounter: Secondary | ICD-10-CM | POA: Diagnosis not present

## 2021-09-18 DIAGNOSIS — I2510 Atherosclerotic heart disease of native coronary artery without angina pectoris: Secondary | ICD-10-CM

## 2021-09-18 MED ORDER — METOPROLOL SUCCINATE ER 100 MG PO TB24: ORAL_TABLET | ORAL | 3 refills | Status: DC

## 2021-09-18 MED ORDER — BUPROPION HCL ER (SR) 150 MG PO TB12: 150.0000 mg | ORAL_TABLET | Freq: Two times a day (BID) | ORAL | 3 refills | Status: DC

## 2021-09-18 MED ORDER — CETIRIZINE HCL 10 MG PO TABS: 10.0000 mg | ORAL_TABLET | Freq: Every day | ORAL | 3 refills | Status: DC

## 2021-09-18 NOTE — Assessment & Plan Note (Signed)
Patient is complaining of right ear fullness and decreased hearing. On exam, she has a small foreign body in her right ear canal that she says has been there for over a year. The portion of the tympanic membrane that was visualized appears normal. ENT referral placed.

## 2021-09-18 NOTE — Assessment & Plan Note (Signed)
Refilled buproprion. Counseled on cessation.

## 2021-09-18 NOTE — Assessment & Plan Note (Signed)
Patient has chronic iron deficiency in the setting of ulcerative colitis. Currently taking ferrous sulfate 325 mg. Rechecking iron studies today; will send refills pending results.

## 2021-09-18 NOTE — Assessment & Plan Note (Signed)
Currently taking victoza, has lost ~5 lbs per review of our records. Carollee Massed was stopped previously due to hypoglycemia. Rechecking Hgb A1c today. Referral for DM eye exam placed.

## 2021-09-18 NOTE — Patient Instructions (Signed)
Kendra Adams,  It was a pleasure to see you today. I will call you with the results of your blood work. I have placed a referral for you to see and [facility] doctor for your ear. You will be called to schedule this. I have also placed an order for your mammogram and eye exam.  Please follow up with me again in 3 months or sooner if you have any problems. If you have any questions or concerns, call our clinic at [phone] or after hours call [phone] and ask for the internal medicine resident on call.   Thank you!  Dr. Philipp Ovens

## 2021-09-18 NOTE — Assessment & Plan Note (Addendum)
Recently seen by cardiology and losartan was decreased from 100 mg -> 50 mg due to low BP and symtpoms of dizziness. BP today is 107/60. She only recently decreased her losartan dosing last night. She is also taking metoprolol 100 mg qd and imdur 30 mg qd. She has been off treatment for her ulcerative colitis (waiting insurance approval for humira) and has had some increased diarrhea. I suspect her soft BP may be related to volume depletion. Plan to continue with reduced dose losartan and current dose of metoprolol and imdur. Follow up 3 months.

## 2021-09-18 NOTE — Assessment & Plan Note (Signed)
Previously well controlled on Humira, follows with GI. Was receiving this through a patient assistance program but recently received insurance and is now waiting for authorization. Unfortunate she has been off of humira now for 5 months. She reports some worsening of her abdominal pain with associated loose stool. She is waiting to hear back from her GI office today.

## 2021-09-18 NOTE — Progress Notes (Signed)
Subjective:   Patient ID: Kendra Adams female   DOB: 09-02-1960 61 y.o.   MRN: [id number]  HPI: Kendra Adams is a 61 y.o. female with past medical history outlined below here for follow up of her chronic medical conditions. For the details of today's visit, please refer to the assessment and plan.   Past Medical History:  Diagnosis Date   Allergic rhinitis    Asthma    Atopic dermatitis    CAD (coronary artery disease)    a. 12/13/10: mRCA 99%, EF 65-70%;  s/p BMS to mRCA   COPD (chronic obstructive pulmonary disease) (HCC)    Depression    DM2 (diabetes mellitus, type 2) (HCC)    GERD (gastroesophageal reflux disease)    GI bleed    HTN (hypertension)    Hyperlipidemia    Hyperlipidemia    Hypothyroidism    Psoriasis    Seasonal allergies    Tobacco abuse    Current Outpatient Medications  Medication Sig Dispense Refill   ACCU-CHEK GUIDE test strip USE  STRIP TO CHECK GLUCOSE ONCE DAILY 50 each 0   Accu-Chek Softclix Lancets lancets USE   TO CHECK GLUCOSE ONCE DAILY 100 each 0   acetaminophen (TYLENOL) 325 MG tablet Take 2 tablets (650 mg total) by mouth every 6 (six) hours as needed for mild pain (or Fever >/= 101). 30 tablet 0   Adalimumab 40 MG/0.4ML PSKT Inject 40 mg into the skin every 14 (fourteen) days.     albuterol (VENTOLIN HFA) 108 (90 Base) MCG/ACT inhaler Inhale 1-2 puffs into the lungs every 6 (six) hours as needed for wheezing or shortness of breath. 1 each 2   aspirin EC 81 MG tablet Take 1 tablet (81 mg total) by mouth daily. Swallow whole. 90 tablet 3   atorvastatin (LIPITOR) 80 MG tablet Take 1 tablet (80 mg total) by mouth daily. 90 tablet 3   betamethasone dipropionate (DIPROLENE) 0.05 % cream Apply topically 2 (two) times daily. 45 g 2   buPROPion (WELLBUTRIN SR) 150 MG 12 hr tablet Take 1 tablet (150 mg total) by mouth 2 (two) times daily. 180 tablet 3   cetirizine (ZYRTEC) 10 MG tablet Take 1 tablet (10 mg total) by mouth daily. 90 tablet 3    ferrous sulfate 325 (65 FE) MG EC tablet Take 1 tablet by mouth once daily with breakfast 90 tablet 1   fluticasone (FLONASE) 50 MCG/ACT nasal spray Place 2 sprays into both nostrils daily. 16 g 2   Insulin Pen Needle 32G X 4 MM MISC Use daily as diretced 100 each 1   isosorbide mononitrate (IMDUR) 30 MG 24 hr tablet Take 0.5 tablets (15 mg total) by mouth daily. 45 tablet 3   levothyroxine (SYNTHROID) 150 MCG tablet TAKE 1 TABLET BY MOUTH BEFORE BREAKFAST 90 tablet 3   liraglutide (VICTOZA) 18 MG/3ML SOPN Inject 0.6 mg into the skin daily for 7 days, THEN 1.2 mg daily for 7 days, THEN 1.8 mg daily for 14 days. 9 mL 1   losartan (COZAAR) 100 MG tablet Take 0.5 tablets (50 mg total) by mouth at bedtime. 45 tablet 3   meclizine (ANTIVERT) 25 MG tablet Take 25 mg by mouth as needed for dizziness.     metFORMIN (GLUCOPHAGE) 1000 MG tablet Take 1 tablet (1,000 mg total) by mouth 2 (two) times daily. 180 tablet 3   metoprolol succinate (TOPROL-XL) 100 MG 24 hr tablet Take with or immediately following a meal. 90 tablet 3  nitroGLYCERIN (NITROSTAT) 0.4 MG SL tablet DISSOLVE ONE TABLET UNDER THE TONGUE EVERY 5 MINUTES AS NEEDED FOR CHEST PAIN.  DO NOT EXCEED A TOTAL OF 3 DOSES IN 15 MINUTES...CALL 911 25 tablet 3   omeprazole (PRILOSEC) 40 MG capsule Take 1 capsule (40 mg total) by mouth daily. 90 capsule 3   ondansetron (ZOFRAN) 4 MG tablet Take 1 tablet (4 mg total) by mouth every 8 (eight) hours as needed for nausea or vomiting. 4 tablet 0   rOPINIRole (REQUIP) 2 MG tablet Take 1 tablet (2 mg total) by mouth at bedtime. 90 tablet 3   sertraline (ZOLOFT) 100 MG tablet Take 1 and 1/2 tablets by mouth once daily 45 tablet 11   Spacer/Aero-Holding Chambers (AEROCHAMBER PLUS) inhaler Use as instructed 1 each 2   No current facility-administered medications for this visit.   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  Vaping Use   Vaping Use: Some days  Substance and Sexual Activity   Alcohol use: No    Alcohol/week: 0.0 standard drinks of alcohol   Drug use: Yes    Frequency: 7.0 times per week    Types: Marijuana    Comment: Marijuana Use   Sexual activity: Not on file  Other Topics Concern   Not on file  Social History Narrative   Father died of suicide (gunshot) in 48. 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       Patient lives with her husband, her husband's niece Tammy who is mentally retarded and her best friend and the friend's daughter and 6 dogs               Social Determinants of Health   Financial Resource Strain: Not on file  Food Insecurity: Not on file  Transportation Needs: Not on file  Physical Activity: Inactive (04/16/2017)   Exercise Vital Sign    Days of Exercise per Week: 0 days    Minutes of Exercise per Session: 0 min  Stress: No Stress Concern Present (04/16/2017)   [place] of Stress : Not at all  Social Connections: Somewhat Isolated (04/16/2017)   Social Connection and Isolation Panel [NHANES]    Frequency of Communication with Friends and Family: More than three times a week    Frequency of Social Gatherings with Friends and  Family: Once a week    Attends Religious Services: Never    Marine scientist or Organizations: No    Attends Archivist  Meetings: Never    Marital Status: Married    Review of Systems: ROS   Objective:  Physical Exam:  Vitals:   09/18/21 0957 09/18/21 0958  BP:  107/60  Pulse:  71  SpO2:  96%  Weight: 228 lb 1.6 oz (103.5 kg) 228 lb 3.2 oz (103.5 kg)  Height: 5' 3"  (1.6 m) 5' 3"  (1.6 m)    Constitutional: Obese, NAD HEENT: Foreign body (opaque appearing bead) in the right ear canal, right tympanic membrane partially visualized and appears intact without erythema. Left TM and ear canal appear normal.  Cardiovascular: RRR, no m/r/g Pulmonary/Chest: Clear bilaterally, normal effort  Extremities: No edema   Assessment & Plan:   Ulcerative colitis ([place]) Previously well controlled on Humira, follows with GI. Was receiving this through a patient assistance program but recently received insurance and is now waiting for authorization. Unfortunate she has been off of humira now for 5 months. She reports some worsening of her abdominal pain with associated loose stool. She is waiting to hear back from her GI office today.   Tobacco use disorder Refilled buproprion. Counseled on cessation.   Iron deficiency Patient has chronic iron deficiency in the setting of ulcerative colitis. Currently taking ferrous sulfate 325 mg. Rechecking iron studies today; will send refills pending results.   Encounter for screening mammogram for breast cancer Order for screening mammogram placed.   Ear foreign body Patient is complaining of right ear fullness and decreased hearing. On exam, she has a small foreign body in her right ear canal that she says has been there for over a year. The portion of the tympanic membrane that was visualized appears normal. ENT referral placed.   Diabetes mellitus type 2 with complications ([place]) Currently taking victoza, has lost ~5 lbs per review of our  records. Carollee Massed was stopped previously due to hypoglycemia. Rechecking Hgb A1c today. Referral for DM eye exam placed.   Hypertension associated with diabetes ([place]) Recently seen by cardiology and losartan was decreased from 100 mg -> 50 mg due to low BP and symtpoms of dizziness. BP today is 107/60. She only recently decreased her losartan dosing last night. She is also taking metoprolol 100 mg qd and imdur 30 mg qd. She has been off treatment for her ulcerative colitis (waiting insurance approval for humira) and has had some increased diarrhea. I suspect her soft BP may be related to volume depletion. Plan to continue with reduced dose losartan and current dose of metoprolol and imdur. Follow up 3 months.

## 2021-09-18 NOTE — Assessment & Plan Note (Signed)
Order for screening mammogram placed.  

## 2021-09-19 LAB — IRON,TIBC AND FERRITIN PANEL
Ferritin: 176 ng/mL — ABNORMAL HIGH (ref 15–150)
Iron Saturation: 15 % (ref 15–55)
Iron: 43 ug/dL (ref 27–139)
Total Iron Binding Capacity: 290 ug/dL (ref 250–450)
UIBC: 247 ug/dL (ref 118–369)

## 2021-09-19 LAB — HEMOGLOBIN A1C
Est. average glucose Bld gHb Est-mCnc: 160 mg/dL
Hgb A1c MFr Bld: 7.2 % — ABNORMAL HIGH (ref 4.8–5.6)

## 2021-09-20 ENCOUNTER — Other Ambulatory Visit: Payer: Self-pay | Admitting: Internal Medicine

## 2021-09-20 DIAGNOSIS — D62 Acute posthemorrhagic anemia: Secondary | ICD-10-CM

## 2021-09-20 MED ORDER — FERROUS SULFATE 325 (65 FE) MG PO TBEC: 1.0000 | DELAYED_RELEASE_TABLET | Freq: Every day | ORAL | 1 refills | Status: DC

## 2021-09-25 ENCOUNTER — Ambulatory Visit (HOSPITAL_COMMUNITY)
Admission: RE | Admit: 2021-09-25 | Discharge: 2021-09-25 | Disposition: A | Discharge status: Home / Self Care | Payer: Commercial Managed Care - HMO | Source: Ambulatory Visit | Attending: Internal Medicine | Admitting: Internal Medicine

## 2021-09-25 ENCOUNTER — Other Ambulatory Visit: Payer: Commercial Managed Care - HMO

## 2021-09-25 DIAGNOSIS — I10 Essential (primary) hypertension: Secondary | ICD-10-CM | POA: Diagnosis not present

## 2021-09-25 DIAGNOSIS — E119 Type 2 diabetes mellitus without complications: Secondary | ICD-10-CM | POA: Insufficient documentation

## 2021-09-25 DIAGNOSIS — I2510 Atherosclerotic heart disease of native coronary artery without angina pectoris: Secondary | ICD-10-CM | POA: Diagnosis not present

## 2021-09-25 DIAGNOSIS — J449 Chronic obstructive pulmonary disease, unspecified: Secondary | ICD-10-CM | POA: Insufficient documentation

## 2021-09-25 DIAGNOSIS — R0609 Other forms of dyspnea: Secondary | ICD-10-CM | POA: Diagnosis not present

## 2021-09-25 DIAGNOSIS — F17200 Nicotine dependence, unspecified, uncomplicated: Secondary | ICD-10-CM | POA: Insufficient documentation

## 2021-09-25 DIAGNOSIS — E785 Hyperlipidemia, unspecified: Secondary | ICD-10-CM | POA: Insufficient documentation

## 2021-09-25 LAB — ECHOCARDIOGRAM COMPLETE
Area-P 1/2: 3.21 cm2
S' Lateral: 3.8 cm

## 2021-09-26 ENCOUNTER — Other Ambulatory Visit: Payer: Commercial Managed Care - HMO

## 2021-09-27 ENCOUNTER — Other Ambulatory Visit: Payer: Commercial Managed Care - HMO

## 2021-09-27 DIAGNOSIS — R0609 Other forms of dyspnea: Secondary | ICD-10-CM

## 2021-09-27 DIAGNOSIS — I2510 Atherosclerotic heart disease of native coronary artery without angina pectoris: Secondary | ICD-10-CM

## 2021-09-27 LAB — COMPREHENSIVE METABOLIC PANEL
ALT: 14 IU/L (ref 0–32)
AST: 16 IU/L (ref 0–40)
Albumin/Globulin Ratio: 1.4 (ref 1.2–2.2)
Albumin: 4 g/dL (ref 3.9–4.9)
Alkaline Phosphatase: 118 IU/L (ref 44–121)
BUN/Creatinine Ratio: 13 (ref 12–28)
BUN: 12 mg/dL (ref 8–27)
Bilirubin Total: 0.2 mg/dL (ref 0.0–1.2)
CO2: 27 mmol/L (ref 20–29)
Calcium: 9.8 mg/dL (ref 8.7–10.3)
Chloride: 100 mmol/L (ref 96–106)
Creatinine, Ser: 0.89 mg/dL (ref 0.57–1.00)
Globulin, Total: 2.8 g/dL (ref 1.5–4.5)
Glucose: 166 mg/dL — ABNORMAL HIGH (ref 70–99)
Potassium: 4.6 mmol/L (ref 3.5–5.2)
Sodium: 140 mmol/L (ref 134–144)
Total Protein: 6.8 g/dL (ref 6.0–8.5)
eGFR: 73 mL/min/{1.73_m2} (ref >59)

## 2021-09-27 LAB — LIPID PANEL
Chol/HDL Ratio: 4 ratio (ref 0.0–4.4)
Cholesterol, Total: 131 mg/dL (ref 100–199)
HDL: 33 mg/dL — ABNORMAL LOW (ref >39)
LDL Chol Calc (NIH): 73 mg/dL (ref 0–99)
Triglycerides: 140 mg/dL (ref 0–149)
VLDL Cholesterol Cal: 25 mg/dL (ref 5–40)

## 2021-09-28 ENCOUNTER — Other Ambulatory Visit: Payer: Self-pay | Admitting: Internal Medicine

## 2021-09-28 DIAGNOSIS — D62 Acute posthemorrhagic anemia: Secondary | ICD-10-CM

## 2021-10-01 ENCOUNTER — Ambulatory Visit
Admission: RE | Admit: 2021-10-01 | Discharge: 2021-10-01 | Disposition: A | Discharge status: Home / Self Care | Payer: Commercial Managed Care - HMO | Source: Ambulatory Visit | Attending: Internal Medicine | Admitting: Internal Medicine

## 2021-10-01 DIAGNOSIS — Z1231 Encounter for screening mammogram for malignant neoplasm of breast: Secondary | ICD-10-CM

## 2021-10-30 ENCOUNTER — Other Ambulatory Visit: Payer: Self-pay | Admitting: Internal Medicine

## 2021-10-30 ENCOUNTER — Other Ambulatory Visit (HOSPITAL_COMMUNITY): Payer: Self-pay

## 2021-10-30 DIAGNOSIS — E118 Type 2 diabetes mellitus with unspecified complications: Secondary | ICD-10-CM

## 2021-10-31 ENCOUNTER — Other Ambulatory Visit (HOSPITAL_COMMUNITY): Payer: Self-pay

## 2021-10-31 MED ORDER — VICTOZA 18 MG/3ML ~~LOC~~ SOPN
1.8000 mg | PEN_INJECTOR | Freq: Every day | SUBCUTANEOUS | 2 refills | Status: DC
  Filled 2021-10-31: qty 9, 30d supply, fill #0

## 2021-11-01 ENCOUNTER — Other Ambulatory Visit (HOSPITAL_COMMUNITY): Payer: Self-pay

## 2021-11-06 ENCOUNTER — Encounter: Payer: Commercial Managed Care - HMO | Admitting: Student

## 2021-11-06 ENCOUNTER — Encounter: Payer: Self-pay | Admitting: Internal Medicine

## 2021-11-08 ENCOUNTER — Other Ambulatory Visit: Payer: Self-pay | Admitting: Internal Medicine

## 2021-11-08 DIAGNOSIS — F17200 Nicotine dependence, unspecified, uncomplicated: Secondary | ICD-10-CM

## 2021-12-04 ENCOUNTER — Other Ambulatory Visit: Payer: Self-pay | Admitting: Podiatry

## 2021-12-04 DIAGNOSIS — M2041 Other hammer toe(s) (acquired), right foot: Secondary | ICD-10-CM

## 2021-12-07 ENCOUNTER — Encounter (HOSPITAL_COMMUNITY): Payer: Self-pay | Admitting: *Deleted

## 2021-12-07 ENCOUNTER — Other Ambulatory Visit: Payer: Self-pay

## 2021-12-07 ENCOUNTER — Emergency Department (HOSPITAL_COMMUNITY)
Admission: EM | Admit: 2021-12-07 | Discharge: 2021-12-08 | Discharge status: Against Medical Advice | Payer: Self-pay | Attending: Student | Admitting: Student

## 2021-12-07 ENCOUNTER — Emergency Department (HOSPITAL_COMMUNITY): Payer: Self-pay

## 2021-12-07 DIAGNOSIS — R519 Headache, unspecified: Secondary | ICD-10-CM | POA: Insufficient documentation

## 2021-12-07 DIAGNOSIS — M542 Cervicalgia: Secondary | ICD-10-CM | POA: Insufficient documentation

## 2021-12-07 DIAGNOSIS — Z5321 Procedure and treatment not carried out due to patient leaving prior to being seen by health care provider: Secondary | ICD-10-CM | POA: Insufficient documentation

## 2021-12-07 NOTE — ED Provider Triage Note (Signed)
Emergency Medicine Provider Triage Evaluation Note  Kendra Adams , a 61 y.o. female  was evaluated in triage.  Pt complains of head pain and neck pain secondary to a fall.  Patient was backing down her steps when she missed a step and fell backwards, hitting her head on concrete.  She believes she fell down 2 steps.  She denies loss of consciousness.  She does not take blood thinners.  Patient also complains of neck pain.  Review of Systems  Positive: As above Negative: As Above  Physical Exam  BP 137/78 (BP Location: Right Arm)   Pulse 74   Temp 98.6 F (37 C) (Oral)   Resp 16   SpO2 96%  Gen:   Awake, no distress   Resp:  Normal effort  MSK:   Moves extremities without difficulty  Other:    Medical Decision Making  Medically screening exam initiated at 7:40 PM.  Appropriate orders placed.  Kendra Adams was informed that the remainder of the evaluation will be completed by another provider, this initial triage assessment does not replace that evaluation, and the importance of remaining in the ED until their evaluation is complete.     Dorothyann Peng, PA-C 12/07/21 1941

## 2021-12-07 NOTE — ED Triage Notes (Signed)
Pt here pov for fall.  Pt states she missed a step "going backwards" going 2 steps up, and hit the back of her head on the cement.  No loc.  Now c/o R occipital pain.     PERRL  Denies nausea or dizziness.  Pt is not on blood thinners.  Ao x 4

## 2021-12-08 NOTE — ED Notes (Signed)
Pt left due to not being seen quick enough 

## 2021-12-25 ENCOUNTER — Encounter: Payer: Self-pay | Admitting: Internal Medicine

## 2021-12-25 ENCOUNTER — Other Ambulatory Visit (HOSPITAL_COMMUNITY)
Admission: RE | Admit: 2021-12-25 | Discharge: 2021-12-25 | Disposition: A | Discharge status: Home / Self Care | Payer: Self-pay | Source: Ambulatory Visit | Attending: Internal Medicine | Admitting: Internal Medicine

## 2021-12-25 ENCOUNTER — Ambulatory Visit (INDEPENDENT_AMBULATORY_CARE_PROVIDER_SITE_OTHER): Payer: Self-pay | Admitting: Internal Medicine

## 2021-12-25 ENCOUNTER — Other Ambulatory Visit (HOSPITAL_COMMUNITY): Payer: Self-pay

## 2021-12-25 VITALS — BP 143/121 | HR 88 | Temp 98.6°F | Ht 62.0 in | Wt 234.3 lb

## 2021-12-25 DIAGNOSIS — R1319 Other dysphagia: Secondary | ICD-10-CM

## 2021-12-25 DIAGNOSIS — E611 Iron deficiency: Secondary | ICD-10-CM

## 2021-12-25 DIAGNOSIS — A630 Anogenital (venereal) warts: Secondary | ICD-10-CM

## 2021-12-25 DIAGNOSIS — E118 Type 2 diabetes mellitus with unspecified complications: Secondary | ICD-10-CM

## 2021-12-25 DIAGNOSIS — Z794 Long term (current) use of insulin: Secondary | ICD-10-CM

## 2021-12-25 DIAGNOSIS — K5190 Ulcerative colitis, unspecified, without complications: Secondary | ICD-10-CM

## 2021-12-25 DIAGNOSIS — Z113 Encounter for screening for infections with a predominantly sexual mode of transmission: Secondary | ICD-10-CM

## 2021-12-25 DIAGNOSIS — Z7984 Long term (current) use of oral hypoglycemic drugs: Secondary | ICD-10-CM

## 2021-12-25 DIAGNOSIS — F17210 Nicotine dependence, cigarettes, uncomplicated: Secondary | ICD-10-CM

## 2021-12-25 DIAGNOSIS — Z6841 Body Mass Index (BMI) 40.0 and over, adult: Secondary | ICD-10-CM

## 2021-12-25 DIAGNOSIS — F17200 Nicotine dependence, unspecified, uncomplicated: Secondary | ICD-10-CM

## 2021-12-25 LAB — GLUCOSE, CAPILLARY: Glucose-Capillary: 135 mg/dL — ABNORMAL HIGH (ref 70–99)

## 2021-12-25 LAB — POCT GLYCOSYLATED HEMOGLOBIN (HGB A1C): Hemoglobin A1C: 7.3 % — AB (ref 4.0–5.6)

## 2021-12-25 MED ORDER — VICTOZA 18 MG/3ML ~~LOC~~ SOPN
1.8000 mg | PEN_INJECTOR | Freq: Every day | SUBCUTANEOUS | 2 refills | Status: DC
  Filled 2021-12-25 – 2022-01-21 (×2): qty 9, 30d supply, fill #0

## 2021-12-25 MED ORDER — BUPROPION HCL ER (SR) 150 MG PO TB12: 150.0000 mg | ORAL_TABLET | Freq: Two times a day (BID) | ORAL | 3 refills | Status: DC

## 2021-12-25 MED ORDER — VICTOZA 18 MG/3ML ~~LOC~~ SOPN: 1.8000 mg | PEN_INJECTOR | Freq: Every day | SUBCUTANEOUS | 2 refills | Status: DC

## 2021-12-25 NOTE — Assessment & Plan Note (Signed)
Continue iron supplements, recheck iron studies at follow up in 3 months.

## 2021-12-25 NOTE — Assessment & Plan Note (Signed)
Hgb A1c is near goal, 7.3. She is taking metformin 1000 mg BID and victoza, 1.2 mg daily. We discussed increasing victoza for optimal glycemic control and the potential benefit of further weight loss, she is agreeable to trying. Sent new Rx, [facility] outpatient pharmacy $4 list.

## 2021-12-25 NOTE — Assessment & Plan Note (Signed)
Recently started smoking again due to life stressors. Request to restart bupropion. Rx sent.

## 2021-12-25 NOTE — Progress Notes (Signed)
Subjective:   Patient ID: Kendra Adams female   DOB: Mar 19, 1960 61 y.o.   MRN: [id number]  HPI: [name] is a 61 y.o. female with past medical history outlined below here for follow up of her diabetes. For the details of today's visit, please refer to the assessment and plan.   Past Medical History:  Diagnosis Date   Allergic rhinitis    Asthma    Atopic dermatitis    CAD (coronary artery disease)    a. 12/13/10: mRCA 99%, EF 65-70%;  s/p BMS to mRCA   COPD (chronic obstructive pulmonary disease) (HCC)    Depression    DM2 (diabetes mellitus, type 2) (HCC)    GERD (gastroesophageal reflux disease)    GI bleed    HTN (hypertension)    Hyperlipidemia    Hyperlipidemia    Hypothyroidism    Psoriasis    Seasonal allergies    Tobacco abuse    Current Outpatient Medications  Medication Sig Dispense Refill   ACCU-CHEK GUIDE test strip USE  STRIP TO CHECK GLUCOSE ONCE DAILY 50 each 0   Accu-Chek Softclix Lancets lancets USE   TO CHECK GLUCOSE ONCE DAILY 100 each 0   acetaminophen (TYLENOL) 325 MG tablet Take 2 tablets (650 mg total) by mouth every 6 (six) hours as needed for mild pain (or Fever >/= 101). 30 tablet 0   Adalimumab 40 MG/0.4ML PSKT Inject 40 mg into the skin every 14 (fourteen) days.     albuterol (VENTOLIN HFA) 108 (90 Base) MCG/ACT inhaler Inhale 1-2 puffs into the lungs every 6 (six) hours as needed for wheezing or shortness of breath. 1 each 2   aspirin EC 81 MG tablet Take 1 tablet (81 mg total) by mouth daily. Swallow whole. 90 tablet 3   atorvastatin (LIPITOR) 80 MG tablet Take 1 tablet (80 mg total) by mouth daily. 90 tablet 3   betamethasone dipropionate (DIPROLENE) 0.05 % cream Apply topically 2 (two) times daily. 45 g 2   buPROPion (WELLBUTRIN SR) 150 MG 12 hr tablet Take 1 tablet (150 mg total) by mouth 2 (two) times daily. 180 tablet 3   cetirizine (ZYRTEC) 10 MG tablet Take 1 tablet (10 mg total) by mouth daily. 90 tablet 3   ferrous  sulfate 325 (65 FE) MG EC tablet Take 1 tablet by mouth once daily with breakfast 90 tablet 1   fluticasone (FLONASE) 50 MCG/ACT nasal spray Place 2 sprays into both nostrils daily. 16 g 2   Insulin Pen Needle 32G X 4 MM MISC Use daily as diretced 100 each 1   isosorbide mononitrate (IMDUR) 30 MG 24 hr tablet Take 0.5 tablets (15 mg total) by mouth daily. 45 tablet 3   levothyroxine (SYNTHROID) 150 MCG tablet TAKE 1 TABLET BY MOUTH BEFORE BREAKFAST 90 tablet 3   liraglutide (VICTOZA) 18 MG/3ML SOPN Inject 1.8 mg into the skin daily. 9 mL 2   losartan (COZAAR) 100 MG tablet Take 0.5 tablets (50 mg total) by mouth at bedtime. 45 tablet 3   meclizine (ANTIVERT) 25 MG tablet Take 25 mg by mouth as needed for dizziness.     metFORMIN (GLUCOPHAGE) 1000 MG tablet Take 1 tablet (1,000 mg total) by mouth 2 (two) times daily. 180 tablet 3   metoprolol succinate (TOPROL-XL) 100 MG 24 hr tablet Take with or immediately following a meal. 90 tablet 3   nitroGLYCERIN (NITROSTAT) 0.4 MG SL tablet DISSOLVE ONE TABLET UNDER THE TONGUE EVERY 5 MINUTES AS NEEDED  FOR CHEST PAIN.  DO NOT EXCEED A TOTAL OF 3 DOSES IN 15 MINUTES...CALL 911 25 tablet 3   omeprazole (PRILOSEC) 40 MG capsule Take 1 capsule (40 mg total) by mouth daily. 90 capsule 3   ondansetron (ZOFRAN) 4 MG tablet Take 1 tablet (4 mg total) by mouth every 8 (eight) hours as needed for nausea or vomiting. 4 tablet 0   rOPINIRole (REQUIP) 2 MG tablet Take 1 tablet (2 mg total) by mouth at bedtime. 90 tablet 3   sertraline (ZOLOFT) 100 MG tablet Take 1 and 1/2 tablets by mouth once daily 45 tablet 11   Spacer/Aero-Holding Chambers (AEROCHAMBER PLUS) inhaler Use as instructed 1 each 2   No current facility-administered medications for this visit.   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  Vaping Use   Vaping Use: Some days  Substance and Sexual Activity   Alcohol use: No    Alcohol/week: 0.0 standard drinks of alcohol   Drug use: Yes    Frequency: 7.0 times per week    Types: Marijuana    Comment: Marijuana Use   Sexual activity: Not on file  Other Topics Concern   Not on file  Social History Narrative   Father died of suicide (gunshot) in 55. 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       Patient lives with her husband, her husband's niece Tammy who is mentally retarded and her best friend and the friend's daughter and 6 dogs               Social Determinants of Health   Financial Resource Strain: Not on file  Food Insecurity: Not on file  Transportation Needs: Not on file  Physical Activity: Inactive (04/16/2017)   Exercise Vital Sign    Days of Exercise per Week: 0 days    Minutes of Exercise per Session: 0 min  Stress: No Stress Concern Present (04/16/2017)   [place] of Stress : Not at all  Social Connections: Somewhat Isolated (04/16/2017)   Social Connection and Isolation Panel [NHANES]    Frequency of Communication with Friends and Family: More than three times a week    Frequency of Social Gatherings with Friends and Family: Once a week    Attends Religious Services: Never     Marine scientist or Organizations: No    Attends Archivist Meetings: Never    Marital Status: Married  Objective:  Physical Exam:  Vitals:   12/25/21 0947  BP: (!) 143/121  Pulse: 88  Temp: 98.6 F (37 C)  TempSrc: Oral  SpO2: 96%  Weight: 234 lb 4.8 oz (106.3 kg)  Height: 5' 2"  (1.575 m)    Constitutional: NAD, well appearing HEENT: Normal / clear posterior oropharynx  Cardiovascular: RRR. 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 Has been practicing oral sex and developed a new "skin tag" on the inside mucosal surface of her lower lip. On exam, she appears to have a very small condyloma, likely oral wart. Will observe for now, if lesion does not resolve will consider therapies for treatment.   Routine screening for STI (sexually transmitted infection) Denies vaginal symptoms and discharge. However she is sexually active with a new partner. We have sent for GC / chlamydia and trichomonas testing. Her last PAP smear done one year ago was negative for HPV.   Iron deficiency Continue iron supplements, recheck iron studies at follow up in 3 months.   Tobacco use disorder Recently started smoking again due to life stressors. Request to restart bupropion. Rx sent.   Diabetes mellitus type 2 with complications (HCC) Hgb Z7H is near goal, 7.3. She is taking metformin 1000 mg BID and victoza, 1.2 mg daily. We discussed increasing victoza for optimal glycemic control and the potential benefit of further weight loss, she is agreeable to trying. Sent new Rx, [facility] outpatient pharmacy $4 list.   Ulcerative colitis ([place]) Following with GI, has been off Humira due to insurance coverage issues. Currently uninsured, she is going by their office today to sign paper work for patient assistance. Has some mild  GI symptoms but no diarrhea or blood in her stool.   Esophageal dysphagia Patient complaining of food and medication getting lodge in her throat. This happens frequently. I did treat her empirically for esophageal candidiasis last year. No signs of thrush currently on exam. She already follows with GI for her ulcerative colitis. I will place a new referral for evaluation of her dysphagia.

## 2021-12-25 NOTE — Assessment & Plan Note (Signed)
Patient complaining of food and medication getting lodge in her throat. This happens frequently. I did treat her empirically for esophageal candidiasis last year. No signs of thrush currently on exam. She already follows with GI for her ulcerative colitis. I will place a new referral for evaluation of her dysphagia.

## 2021-12-25 NOTE — Patient Instructions (Addendum)
Kendra Adams,  It was a pleasure to see you today. Please increase your victoza to 1.8 mg. I will call you with the results of your testing today.   Please follow up with me again in 3 months or sooner if you have any issues.  If you have any questions or concerns, call our clinic at [phone] or after hours call [phone] and ask for the internal medicine resident on call.   Thank you!  Dr. Darnell Level

## 2021-12-25 NOTE — Assessment & Plan Note (Signed)
Patient is sexually active with a new partner. Has been practicing oral sex and developed a new "skin tag" on the inside mucosal surface of her lower lip. On exam, she appears to have a very small condyloma, likely oral wart. Will observe for now, if lesion does not resolve will consider therapies for treatment.

## 2021-12-25 NOTE — Assessment & Plan Note (Signed)
Denies vaginal symptoms and discharge. However she is sexually active with a new partner. We have sent for GC / chlamydia and trichomonas testing. Her last PAP smear done one year ago was negative for HPV.

## 2021-12-25 NOTE — Assessment & Plan Note (Signed)
Following with GI, has been off Humira due to insurance coverage issues. Currently uninsured, she is going by their office today to sign paper work for patient assistance. Has some mild GI symptoms but no diarrhea or blood in her stool.

## 2021-12-26 LAB — CERVICOVAGINAL ANCILLARY ONLY
Chlamydia: NEGATIVE
Comment: NEGATIVE
Comment: NEGATIVE
Comment: NORMAL
Neisseria Gonorrhea: NEGATIVE
Trichomonas: NEGATIVE

## 2022-01-01 ENCOUNTER — Other Ambulatory Visit: Payer: Self-pay | Admitting: Internal Medicine

## 2022-01-01 DIAGNOSIS — K219 Gastro-esophageal reflux disease without esophagitis: Secondary | ICD-10-CM

## 2022-01-02 ENCOUNTER — Telehealth: Payer: Self-pay | Admitting: Family Medicine

## 2022-01-02 DIAGNOSIS — J019 Acute sinusitis, unspecified: Secondary | ICD-10-CM

## 2022-01-02 DIAGNOSIS — B9689 Other specified bacterial agents as the cause of diseases classified elsewhere: Secondary | ICD-10-CM

## 2022-01-02 MED ORDER — AMOXICILLIN-POT CLAVULANATE 875-125 MG PO TABS: 1.0000 | ORAL_TABLET | Freq: Two times a day (BID) | ORAL | 0 refills | Status: AC

## 2022-01-02 NOTE — Progress Notes (Signed)

## 2022-01-03 ENCOUNTER — Other Ambulatory Visit (HOSPITAL_COMMUNITY): Payer: Self-pay

## 2022-01-15 ENCOUNTER — Other Ambulatory Visit: Payer: Self-pay

## 2022-01-15 ENCOUNTER — Ambulatory Visit: Payer: Self-pay | Admitting: Internal Medicine

## 2022-01-15 ENCOUNTER — Encounter: Payer: Self-pay | Admitting: Internal Medicine

## 2022-01-15 VITALS — BP 125/63 | HR 73 | Temp 98.2°F | Ht 62.0 in | Wt 230.7 lb

## 2022-01-15 DIAGNOSIS — Z7984 Long term (current) use of oral hypoglycemic drugs: Secondary | ICD-10-CM

## 2022-01-15 DIAGNOSIS — Z794 Long term (current) use of insulin: Secondary | ICD-10-CM

## 2022-01-15 DIAGNOSIS — B3731 Acute candidiasis of vulva and vagina: Secondary | ICD-10-CM

## 2022-01-15 DIAGNOSIS — E118 Type 2 diabetes mellitus with unspecified complications: Secondary | ICD-10-CM

## 2022-01-15 DIAGNOSIS — L52 Erythema nodosum: Secondary | ICD-10-CM | POA: Insufficient documentation

## 2022-01-15 MED ORDER — FLUCONAZOLE 150 MG PO TABS: 150.0000 mg | ORAL_TABLET | ORAL | 0 refills | Status: DC | PRN

## 2022-01-15 MED ORDER — PREDNISONE 20 MG PO TABS: 20.0000 mg | ORAL_TABLET | Freq: Every day | ORAL | 0 refills | Status: DC

## 2022-01-15 NOTE — Assessment & Plan Note (Signed)
Foot exam done today. Pulses 2+ bilaterally. Hammer toe formation on the second right toe. Onychomycosis bilaterally. Still uninsured, unable to refer to podiatry.

## 2022-01-15 NOTE — Progress Notes (Signed)
Subjective:   Patient ID: Kendra Adams female   DOB: 08-20-1960 61 y.o.   MRN: [id number]  HPI: [name] is a 61 y.o. female with past medical history outlined below here for an acute visit with complaint of rash. For the details of today's visit, please refer to the assessment and plan.   Past Medical History:  Diagnosis Date   Allergic rhinitis    Asthma    Atopic dermatitis    CAD (coronary artery disease)    a. 12/13/10: mRCA 99%, EF 65-70%;  s/p BMS to mRCA   COPD (chronic obstructive pulmonary disease) (HCC)    Depression    DM2 (diabetes mellitus, type 2) (HCC)    GERD (gastroesophageal reflux disease)    GI bleed    HTN (hypertension)    Hyperlipidemia    Hyperlipidemia    Hypothyroidism    Psoriasis    Seasonal allergies    Tobacco abuse    Current Outpatient Medications  Medication Sig Dispense Refill   fluconazole (DIFLUCAN) 150 MG tablet Take 1 tablet (150 mg total) by mouth every 3 (three) days as needed. 2 tablet 0   predniSONE (DELTASONE) 20 MG tablet Take 1 tablet (20 mg total) by mouth daily with breakfast. 7 tablet 0   ACCU-CHEK GUIDE test strip USE  STRIP TO CHECK GLUCOSE ONCE DAILY 50 each 0   Accu-Chek Softclix Lancets lancets USE   TO CHECK GLUCOSE ONCE DAILY 100 each 0   acetaminophen (TYLENOL) 325 MG tablet Take 2 tablets (650 mg total) by mouth every 6 (six) hours as needed for mild pain (or Fever >/= 101). 30 tablet 0   Adalimumab 40 MG/0.4ML PSKT Inject 40 mg into the skin every 14 (fourteen) days.     albuterol (VENTOLIN HFA) 108 (90 Base) MCG/ACT inhaler Inhale 1-2 puffs into the lungs every 6 (six) hours as needed for wheezing or shortness of breath. 1 each 2   aspirin EC 81 MG tablet Take 1 tablet (81 mg total) by mouth daily. Swallow whole. 90 tablet 3   atorvastatin (LIPITOR) 80 MG tablet Take 1 tablet (80 mg total) by mouth daily. 90 tablet 3   betamethasone dipropionate (DIPROLENE) 0.05 % cream Apply topically 2 (two) times  daily. 45 g 2   buPROPion (WELLBUTRIN SR) 150 MG 12 hr tablet Take 1 tablet (150 mg total) by mouth 2 (two) times daily. 180 tablet 3   cetirizine (ZYRTEC) 10 MG tablet Take 1 tablet (10 mg total) by mouth daily. 90 tablet 3   ferrous sulfate 325 (65 FE) MG EC tablet Take 1 tablet by mouth once daily with breakfast 90 tablet 1   fluticasone (FLONASE) 50 MCG/ACT nasal spray Place 2 sprays into both nostrils daily. 16 g 2   Insulin Pen Needle 32G X 4 MM MISC Use daily as diretced 100 each 1   isosorbide mononitrate (IMDUR) 30 MG 24 hr tablet Take 0.5 tablets (15 mg total) by mouth daily. 45 tablet 3   levothyroxine (SYNTHROID) 150 MCG tablet TAKE 1 TABLET BY MOUTH BEFORE BREAKFAST 90 tablet 3   liraglutide (VICTOZA) 18 MG/3ML SOPN Inject 1.8 mg into the skin daily. 9 mL 2   losartan (COZAAR) 100 MG tablet Take 0.5 tablets (50 mg total) by mouth at bedtime. 45 tablet 3   meclizine (ANTIVERT) 25 MG tablet Take 25 mg by mouth as needed for dizziness.     metFORMIN (GLUCOPHAGE) 1000 MG tablet Take 1 tablet (1,000 mg total) by  mouth 2 (two) times daily. 180 tablet 3   metoprolol succinate (TOPROL-XL) 100 MG 24 hr tablet Take with or immediately following a meal. 90 tablet 3   nitroGLYCERIN (NITROSTAT) 0.4 MG SL tablet DISSOLVE ONE TABLET UNDER THE TONGUE EVERY 5 MINUTES AS NEEDED FOR CHEST PAIN.  DO NOT EXCEED A TOTAL OF 3 DOSES IN 15 MINUTES...CALL 911 25 tablet 3   omeprazole (PRILOSEC) 40 MG capsule Take 1 capsule by mouth once daily 90 capsule 3   ondansetron (ZOFRAN) 4 MG tablet Take 1 tablet (4 mg total) by mouth every 8 (eight) hours as needed for nausea or vomiting. 4 tablet 0   rOPINIRole (REQUIP) 2 MG tablet Take 1 tablet (2 mg total) by mouth at bedtime. 90 tablet 3   sertraline (ZOLOFT) 100 MG tablet Take 1 and 1/2 tablets by mouth once daily 45 tablet 11   Spacer/Aero-Holding Chambers (AEROCHAMBER PLUS) inhaler Use as instructed 1 each 2   No current facility-administered medications for  this visit.   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  Vaping Use   Vaping Use: Some days  Substance and Sexual Activity   Alcohol use: No    Alcohol/week: 0.0 standard drinks of alcohol   Drug use: Yes    Frequency: 7.0 times per week    Types: Marijuana    Comment: Marijuana Use   Sexual activity: Not on file  Other Topics Concern   Not on file  Social History Narrative   Father died of suicide (gunshot) in 55. 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       Patient lives with her husband, her husband's niece Tammy who is mentally retarded and her best friend and the friend's daughter and 6 dogs               Social Determinants of Health   Financial Resource Strain: Not on file  Food Insecurity: Not on file  Transportation Needs: Not on file  Physical Activity: Inactive (04/16/2017)   Exercise Vital Sign    Days of Exercise per Week: 0 days    Minutes of Exercise per Session: 0 min  Stress: No Stress Concern Present (04/16/2017)   [place] of Stress : Not at all  Social Connections: Somewhat Isolated (04/16/2017)   Social Connection  and Isolation Panel [NHANES]    Frequency of Communication with Friends and Family: More than three times a week  Frequency of Social Gatherings with Friends and Family: Once a week    Attends Religious Services: Never    Marine scientist or Organizations: No    Attends Music therapist: Never    Marital Status: Married    Objective:  Physical Exam:  Vitals:   01/15/22 0817  BP: 125/63  Pulse: 73  Temp: 98.2 F (36.8 C)  TempSrc: Oral  SpO2: 94%  Weight: 230 lb 11.2 oz (104.6 kg)  Height: 5' 2"  (1.575 m)    Constitutional: NAD, well appearing  Extremities: Warm, pulses 2+ bilaterally. Dry skin with scaling bilaterally. Discrete area of erythema with swelling and induration over her right anterior shin.  Psychiatric: Normal mood and affect  Assessment & Plan:   Diabetes mellitus type 2 with complications ([place]) Foot exam done today. Pulses 2+ bilaterally. Hammer toe formation on the second right toe. Onychomycosis bilaterally. Still uninsured, unable to refer to podiatry.   Erythema nodosum Patient is here with a painful rash on her right lower extremity that started after being on her feet trick or treating with her grandchildren for Halloween.  She has a history of ulcerative colitis and psoriasis and has been off her Humira treatment since March.  On exam there is an area of erythema with swelling and induration over her right anterior shin.  She just recently finished a course of Augmentin for sinusitis.  I favor treating this as erythema nodosum given her untreated inflammatory bowel disease.  I have sent a prescription for prednisone 20 mg daily x7 days to her pharmacy.  Instructed her to check her CBGs daily while on this medication given her history of diabetes and to call if the swelling and erythema do not improve.  She just qualified for patient assistance for Humira and was told it would be delivered this Saturday.  Vaginal candidiasis Patient  reports vaginal discharge and itching after completing a course of Augmentin for sinusitis.  Symptoms are consistent with her prior yeast infections.  Sent Rx for Diflucan 150 mg.

## 2022-01-15 NOTE — Assessment & Plan Note (Signed)
Patient reports vaginal discharge and itching after completing a course of Augmentin for sinusitis.  Symptoms are consistent with her prior yeast infections.  Sent Rx for Diflucan 150 mg.

## 2022-01-15 NOTE — Patient Instructions (Signed)
Kendra Adams,  For the rash on your legs, please take prednisone 20 mg daily for 1 week. Check you blood sugar every morning while you are on this medicine and call me if your blood sugars are >200.   For your yeast infection, I have sent a prescription for diflucan. Take one tablet one time. You may repeat this in 3 days if symptoms persist.   Follow up with me again in 2 months for your diabetes.  If you have any questions or concerns, call our clinic at [phone] or after hours call [phone] and ask for the internal medicine resident on call.   Thank you!  Dr. Darnell Level

## 2022-01-15 NOTE — Assessment & Plan Note (Signed)
Patient is here with a painful rash on her right lower extremity that started after being on her feet trick or treating with her grandchildren for Halloween.  She has a history of ulcerative colitis and psoriasis and has been off her Humira treatment since March.  On exam there is an area of erythema with swelling and induration over her right anterior shin.  She just recently finished a course of Augmentin for sinusitis.  I favor treating this as erythema nodosum given her untreated inflammatory bowel disease.  I have sent a prescription for prednisone 20 mg daily x7 days to her pharmacy.  Instructed her to check her CBGs daily while on this medication given her history of diabetes and to call if the swelling and erythema do not improve.  She just qualified for patient assistance for Humira and was told it would be delivered this Saturday.

## 2022-01-21 ENCOUNTER — Other Ambulatory Visit (HOSPITAL_COMMUNITY): Payer: Self-pay

## 2022-02-03 ENCOUNTER — Other Ambulatory Visit: Payer: Self-pay | Admitting: Internal Medicine

## 2022-02-15 ENCOUNTER — Other Ambulatory Visit: Payer: Self-pay | Admitting: Internal Medicine

## 2022-02-15 DIAGNOSIS — J309 Allergic rhinitis, unspecified: Secondary | ICD-10-CM

## 2022-03-21 ENCOUNTER — Telehealth: Payer: Self-pay | Admitting: Family Medicine

## 2022-03-21 DIAGNOSIS — J40 Bronchitis, not specified as acute or chronic: Secondary | ICD-10-CM

## 2022-03-21 MED ORDER — PREDNISONE 20 MG PO TABS: 20.0000 mg | ORAL_TABLET | Freq: Two times a day (BID) | ORAL | 0 refills | Status: AC

## 2022-03-21 MED ORDER — BENZONATATE 200 MG PO CAPS: 200.0000 mg | ORAL_CAPSULE | Freq: Two times a day (BID) | ORAL | 0 refills | Status: DC | PRN

## 2022-03-21 MED ORDER — AZITHROMYCIN 250 MG PO TABS: ORAL_TABLET | ORAL | 0 refills | Status: AC

## 2022-03-21 NOTE — Progress Notes (Signed)
We are sorry that you are not feeling well.  Here is how we plan to help!  Based on your presentation I believe you most likely have A cough due to bacteria.  When patients have a fever and a productive cough with a change in color or increased sputum production, we are concerned about bacterial bronchitis.  If left untreated it can progress to pneumonia.  If your symptoms do not improve with your treatment plan it is important that you contact your provider.   I have prescribed Azithromyin 250 mg: two tablets now and then one tablet daily for 4 additonal days    In addition you may use A prescription cough medication called Tessalon Perles 100mg . You may take 1-2 capsules every 8 hours as needed for your cough.   Prednisone for 5 days From your responses in the eVisit questionnaire you describe inflammation in the upper respiratory tract which is causing a significant cough.  This is commonly called Bronchitis and has four common causes:   Allergies Viral Infections Acid Reflux Bacterial Infection Allergies, viruses and acid reflux are treated by controlling symptoms or eliminating the cause. An example might be a cough caused by taking certain blood pressure medications. You stop the cough by changing the medication. Another example might be a cough caused by acid reflux. Controlling the reflux helps control the cough.  USE OF BRONCHODILATOR ("RESCUE") INHALERS: There is a risk from using your bronchodilator too frequently.  The risk is that over-reliance on a medication which only relaxes the muscles surrounding the breathing tubes can reduce the effectiveness of medications prescribed to reduce swelling and congestion of the tubes themselves.  Although you feel brief relief from the bronchodilator inhaler, your asthma may actually be worsening with the tubes becoming more swollen and filled with mucus.  This can delay other crucial treatments, such as oral steroid medications. If you need to use  a bronchodilator inhaler daily, several times per day, you should discuss this with your provider.  There are probably better treatments that could be used to keep your asthma under control.     HOME CARE Only take medications as instructed by your medical team. Complete the entire course of an antibiotic. Drink plenty of fluids and get plenty of rest. Avoid close contacts especially the very young and the elderly Cover your mouth if you cough or cough into your sleeve. Always remember to wash your hands A steam or ultrasonic humidifier can help congestion.   GET HELP RIGHT AWAY IF: You develop worsening fever. You become short of breath You cough up blood. Your symptoms persist after you have completed your treatment plan MAKE SURE YOU  Understand these instructions. Will watch your condition. Will get help right away if you are not doing well or get worse.    Thank you for choosing an e-visit.  Your e-visit answers were reviewed by a board certified advanced clinical practitioner to complete your personal care plan. Depending upon the condition, your plan could have included both over the counter or prescription medications.  Please review your pharmacy choice. Make sure the pharmacy is open so you can pick up prescription now. If there is a problem, you may contact your provider through [employer] and have the prescription routed to another pharmacy.  Your safety is important to [place]. If you have drug allergies check your prescription carefully.   For the next 24 hours you can use MyChart to ask questions about today's visit, request a non-urgent  call back, or ask for a work or school excuse. You will get an email in the next two days asking about your experience. I hope that your e-visit has been valuable and will speed your recovery.    have provided 5 minutes of non face to face time during this encounter for chart review and documentation.

## 2022-04-09 ENCOUNTER — Encounter: Payer: Self-pay | Admitting: Internal Medicine

## 2022-04-09 DIAGNOSIS — J441 Chronic obstructive pulmonary disease with (acute) exacerbation: Secondary | ICD-10-CM

## 2022-04-11 MED ORDER — AZITHROMYCIN 250 MG PO TABS: ORAL_TABLET | ORAL | 0 refills | Status: AC

## 2022-04-11 NOTE — Addendum Note (Signed)
Addended by: Jodean Lima on: 04/11/2022 11:49 AM   Modules accepted: Orders

## 2022-04-17 ENCOUNTER — Encounter: Payer: Self-pay | Admitting: Internal Medicine

## 2022-04-17 ENCOUNTER — Ambulatory Visit (INDEPENDENT_AMBULATORY_CARE_PROVIDER_SITE_OTHER): Payer: BLUE CROSS/BLUE SHIELD | Admitting: Internal Medicine

## 2022-04-17 VITALS — BP 132/77 | HR 86 | Temp 98.9°F | Wt 243.2 lb

## 2022-04-17 DIAGNOSIS — R0600 Dyspnea, unspecified: Secondary | ICD-10-CM

## 2022-04-17 DIAGNOSIS — R053 Chronic cough: Secondary | ICD-10-CM | POA: Diagnosis not present

## 2022-04-17 DIAGNOSIS — E118 Type 2 diabetes mellitus with unspecified complications: Secondary | ICD-10-CM | POA: Diagnosis not present

## 2022-04-17 DIAGNOSIS — Z794 Long term (current) use of insulin: Secondary | ICD-10-CM

## 2022-04-17 DIAGNOSIS — J45909 Unspecified asthma, uncomplicated: Secondary | ICD-10-CM

## 2022-04-17 DIAGNOSIS — J449 Chronic obstructive pulmonary disease, unspecified: Secondary | ICD-10-CM | POA: Diagnosis not present

## 2022-04-17 DIAGNOSIS — Z7985 Long-term (current) use of injectable non-insulin antidiabetic drugs: Secondary | ICD-10-CM

## 2022-04-17 LAB — GLUCOSE, CAPILLARY: Glucose-Capillary: 324 mg/dL — ABNORMAL HIGH (ref 70–99)

## 2022-04-17 LAB — POCT GLYCOSYLATED HEMOGLOBIN (HGB A1C): Hemoglobin A1C: 8.2 % — AB (ref 4.0–5.6)

## 2022-04-17 MED ORDER — SEMAGLUTIDE(0.25 OR 0.5MG/DOS) 2 MG/3ML ~~LOC~~ SOPN: 0.2500 mg | PEN_INJECTOR | SUBCUTANEOUS | 0 refills | Status: DC

## 2022-04-17 MED ORDER — BUDESONIDE-FORMOTEROL FUMARATE 80-4.5 MCG/ACT IN AERO: 2.0000 | INHALATION_SPRAY | Freq: Two times a day (BID) | RESPIRATORY_TRACT | 3 refills | Status: DC

## 2022-04-17 NOTE — Progress Notes (Signed)
CC: coughing  HPI:  [name] is a 62 y.o. with medical history of HTN, DMII, HLD, CAD, Hypothyroidism, COPD, tobacco use disorder presenting to [facility] for complaint of coughing.   Please see problem-based list for further details, assessments, and plans.  Past Medical History:  Diagnosis Date   Allergic rhinitis    Asthma    Atopic dermatitis    CAD (coronary artery disease)    a. 12/13/10: mRCA 99%, EF 65-70%;  s/p BMS to mRCA   COPD (chronic obstructive pulmonary disease) (HCC)    Coughing 03/17/2011   Depression    DM2 (diabetes mellitus, type 2) (HCC)    GERD (gastroesophageal reflux disease)    GI bleed    HTN (hypertension)    Hyperlipidemia    Hyperlipidemia    Hypothyroidism    Psoriasis    Seasonal allergies    Tobacco abuse     Current Outpatient Medications (Endocrine & Metabolic):    0000000 or 0.5MG/DOS, 2 MG/3ML SOPN, Inject 0.25 mg into the skin once a week.   levothyroxine (SYNTHROID) 150 MCG tablet, TAKE 1 TABLET BY MOUTH BEFORE BREAKFAST   metFORMIN (GLUCOPHAGE) 1000 MG tablet, Take 1 tablet (1,000 mg total) by mouth 2 (two) times daily.   predniSONE (DELTASONE) 20 MG tablet, Take 1 tablet (20 mg total) by mouth daily with breakfast.  Current Outpatient Medications (Cardiovascular):    atorvastatin (LIPITOR) 80 MG tablet, Take 1 tablet (80 mg total) by mouth daily.   isosorbide mononitrate (IMDUR) 30 MG 24 hr tablet, Take 0.5 tablets (15 mg total) by mouth daily.   losartan (COZAAR) 100 MG tablet, Take 0.5 tablets (50 mg total) by mouth at bedtime.   metoprolol succinate (TOPROL-XL) 100 MG 24 hr tablet, Take with or immediately following a meal.   nitroGLYCERIN (NITROSTAT) 0.4 MG SL tablet, DISSOLVE ONE TABLET UNDER THE TONGUE EVERY 5 MINUTES AS NEEDED FOR CHEST PAIN.  DO NOT EXCEED A TOTAL OF 3 DOSES IN 15 MINUTES...CALL 911  Current Outpatient Medications (Respiratory):    budesonide-formoterol (SYMBICORT) 80-4.5 MCG/ACT inhaler,  Inhale 2 puffs into the lungs 2 (two) times daily.   albuterol (VENTOLIN HFA) 108 (90 Base) MCG/ACT inhaler, Inhale 1-2 puffs into the lungs every 6 (six) hours as needed for wheezing or shortness of breath.   benzonatate (TESSALON) 200 MG capsule, Take 1 capsule (200 mg total) by mouth 2 (two) times daily as needed for cough.   cetirizine (ZYRTEC) 10 MG tablet, Take 1 tablet by mouth once daily   fluticasone (FLONASE) 50 MCG/ACT nasal spray, Place 2 sprays into both nostrils daily.  Current Outpatient Medications (Analgesics):    acetaminophen (TYLENOL) 325 MG tablet, Take 2 tablets (650 mg total) by mouth every 6 (six) hours as needed for mild pain (or Fever >/= 101).   Adalimumab 40 MG/0.4ML PSKT, Inject 40 mg into the skin every 14 (fourteen) days.   aspirin EC 81 MG tablet, Take 1 tablet (81 mg total) by mouth daily. Swallow whole.  Current Outpatient Medications (Hematological):    ferrous sulfate 325 (65 FE) MG EC tablet, Take 1 tablet by mouth once daily with breakfast  Current Outpatient Medications (Other):    ACCU-CHEK GUIDE test strip, USE  STRIP TO CHECK GLUCOSE ONCE DAILY   Accu-Chek Softclix Lancets lancets, USE   TO CHECK GLUCOSE ONCE DAILY   betamethasone dipropionate (DIPROLENE) 0.05 % cream, Apply topically 2 (two) times daily.   buPROPion (WELLBUTRIN SR) 150 MG 12 hr tablet, Take 1  tablet (150 mg total) by mouth 2 (two) times daily.   fluconazole (DIFLUCAN) 150 MG tablet, Take 1 tablet (150 mg total) by mouth every 3 (three) days as needed.   Insulin Pen Needle 32G X 4 MM MISC, Use daily as diretced   meclizine (ANTIVERT) 25 MG tablet, Take 25 mg by mouth as needed for dizziness.   omeprazole (PRILOSEC) 40 MG capsule, Take 1 capsule by mouth once daily   ondansetron (ZOFRAN) 4 MG tablet, Take 1 tablet (4 mg total) by mouth every 8 (eight) hours as needed for nausea or vomiting.   rOPINIRole (REQUIP) 2 MG tablet, TAKE 1 TABLET BY MOUTH AT BEDTIME   sertraline (ZOLOFT) 100  MG tablet, Take 1 and 1/2 tablets by mouth once daily   Spacer/Aero-Holding Chambers (AEROCHAMBER PLUS) inhaler, Use as instructed  Review of Systems:  Review of system negative unless stated in the problem list or HPI.    Physical Exam:  Vitals:   04/17/22 1413  BP: 132/77  Pulse: 86  Temp: 98.9 F (37.2 C)  TempSrc: Oral  SpO2: 96%  Weight: 243 lb 3.2 oz (110.3 kg)    Physical Exam General: NAD HENT: NCAT Lungs: wheeze noted in all lung fields.  Cardiovascular: Normal heart sounds, no r/m/g, 2+ pulses in all extremities. No LE edema Abdomen: No TTP, normal bowel sounds MSK: No asymmetry or muscle atrophy.  Skin: no lesions noted on exposed skin Neuro: Alert and oriented x4. CN grossly intact Psych: Normal mood and normal affect   Assessment & Plan:   Cough Patient is presenting with persistent coughing that is dry in nature.  States that has been going on for 2 and half months.  This is exacerbated by her sleeping.  Patient did have an E visit with an urgent care where she was treated with azithromycin and prednisone.  She states this did not help her.  On review of systems she is endorsing pressure in her sinuses and postnasal drip.  Still smoking about 3 to 4 cigarettes a day.  Physical exam shows diffuse wheezing.  Patient has multiple reasons for her symptoms including asthma, postnasal drip or GERD.  All of these can be worsened at nighttime.  Given she has wheezing present makes me suspect she could have asthma.  She has an albuterol inhaler that she states helps her symptoms.  I will start her on Symbicort and use albuterol as needed.  We will plan to get PFTs for this patient.  Advised patient not use her inhalers 12 hours prior to her PFTs to get accurate results.  Advised patient to use a Nettie pot and gave instructions on this.  Patient states her husband will help her with this.  Diabetes mellitus type 2 with complications [facility]) Patient has diabetes and her A1c is  8.2 this visit.  Patient previous A1c results more controlled.  Patient is currently on metformin and Victoza 1.8 mg daily.  She endorses missing the Victoza 3 times a week.  I offered her to change her Victoza to Ozempic patient is in agreement.  We will start Ozempic at 0.25 mg and uptitrate monthly.  Advised patient to follow-up in 1 month.   See Encounters Tab for problem based charting.  Patient discussed with Dr. Garald Balding, MD Tillie Rung. [facility] Internal Medicine Residency, PGY-2

## 2022-04-17 NOTE — Patient Instructions (Addendum)
Kendra Adams, it was a pleasure seeing you today! You endorsed feeling well today. Below are some of the things we talked about this visit. We look forward to seeing you in the follow up appointment!  Today we discussed: We will add a daily inhaler for your coughing. We will also have you use a neti pot which may help with the sensation of fluid in your throat.  We will change your victoza to ozempic.   I have ordered the following labs today:  Lab Orders         Glucose, capillary         POC Hbg A1C       Referrals ordered today:   Referral Orders  No referral(s) requested today     I have ordered the following medication/changed the following medications:   Stop the following medications: Medications Discontinued During This Encounter  Medication Reason   liraglutide (VICTOZA) 18 MG/3ML SOPN Change in therapy     Start the following medications: Meds ordered this encounter  Medications   budesonide-formoterol (SYMBICORT) 80-4.5 MCG/ACT inhaler    Sig: Inhale 2 puffs into the lungs 2 (two) times daily.    Dispense:  1 each    Refill:  3   Semaglutide,0.25 or 0.5MG /DOS, 2 MG/3ML SOPN    Sig: Inject 0.25 mg into the skin once a week.    Dispense:  3 mL    Refill:  0     Follow-up: 1 month follow up or earlier as needed  Please make sure to arrive 15 minutes prior to your next appointment. If you arrive late, you may be asked to reschedule.   We look forward to seeing you next time. Please call our clinic at [phone] if you have any questions or concerns. The best time to call is Monday-Friday from 9am-4pm, but there is someone available 24/7. If after hours or the weekend, call the main hospital number and ask for the Internal Medicine Resident On-Call. If you need medication refills, please notify your pharmacy one week in advance and they will send [place] a request.  Thank you for letting us take part in your care. Wishing you the best!  Thank you, Idamae Schuller, MD

## 2022-04-18 ENCOUNTER — Telehealth: Payer: Self-pay

## 2022-04-18 ENCOUNTER — Encounter: Payer: Self-pay | Admitting: Internal Medicine

## 2022-04-18 DIAGNOSIS — J45909 Unspecified asthma, uncomplicated: Secondary | ICD-10-CM | POA: Insufficient documentation

## 2022-04-18 DIAGNOSIS — R059 Cough, unspecified: Secondary | ICD-10-CM | POA: Insufficient documentation

## 2022-04-18 MED ORDER — FLUTICASONE-SALMETEROL 100-50 MCG/ACT IN AEPB: 1.0000 | INHALATION_SPRAY | Freq: Two times a day (BID) | RESPIRATORY_TRACT | 0 refills | Status: DC

## 2022-04-18 NOTE — Assessment & Plan Note (Addendum)
Patient is presenting with persistent coughing that is dry in nature.  States that has been going on for 2 and half months.  This is exacerbated by her sleeping.  Patient did have an E visit with an urgent care where she was treated with azithromycin and prednisone.  She states this did not help her.  On review of systems she is endorsing pressure in her sinuses and postnasal drip.  Still smoking about 3 to 4 cigarettes a day.  Physical exam shows diffuse wheezing.  Patient has multiple reasons for her symptoms including asthma, postnasal drip or GERD.  All of these can be worsened at nighttime.  Given she has wheezing present makes me suspect she could have asthma.  She has an albuterol inhaler that she states helps her symptoms.  I will start her on Symbicort and use albuterol as needed.  We will plan to get PFTs for this patient.  Advised patient not use her inhalers 12 hours prior to her PFTs to get accurate results.  Advised patient to use a Nettie pot and gave instructions on this.  Patient states her husband will help her with this.

## 2022-04-18 NOTE — Addendum Note (Signed)
Addended by: Idamae Schuller on: 04/18/2022 06:01 PM   Modules accepted: Orders

## 2022-04-18 NOTE — Telephone Encounter (Signed)
Decision:Approved Kendra Adams (Key: [id number]) Rx #: [id number] Ozempic (0.25 or 0.5 MG/DOSE) 2MG/3ML pen-injectors Form Blue Building control surveyor Form (CB) Created 17 hours ago Message from Plan Effective from 04/18/2022 through 04/17/2023.

## 2022-04-18 NOTE — Assessment & Plan Note (Deleted)
Patient is presenting with persistent coughing that is dry in nature.  States that has been going on for 2 and half months.  This is exacerbated by her sleeping.  Patient did have an E visit with an urgent care where she was treated with azithromycin and prednisone.  She states this did not help her.  On review of systems she is endorsing pressure in her sinuses and postnasal drip.  Still smoking about 3 to 4 cigarettes a day.  Physical exam shows diffuse wheezing.  Patient has multiple reasons for her symptoms including asthma, postnasal drip or GERD.  All of these can be worsened at nighttime.  Given she has wheezing present makes me suspect she could have asthma.  She has an albuterol inhaler that she states helps her symptoms.  I will start her on Symbicort and use albuterol as needed.  We will plan to get PFTs for this patient.  Advised patient not use her inhalers 12 hours prior to her PFTs to get accurate results.

## 2022-04-18 NOTE — Telephone Encounter (Signed)
Prior Authorization for patient (ozempic) came through on cover my meds was submitted with last office notes and labs awaiting approval or denial

## 2022-04-18 NOTE — Assessment & Plan Note (Signed)
Patient has diabetes and her A1c is 8.2 this visit.  Patient previous A1c results more controlled.  Patient is currently on metformin and Victoza 1.8 mg daily.  She endorses missing the Victoza 3 times a week.  I offered her to change her Victoza to Ozempic patient is in agreement.  We will start Ozempic at 0.25 mg and uptitrate monthly.  Advised patient to follow-up in 1 month.

## 2022-04-21 ENCOUNTER — Other Ambulatory Visit: Payer: Self-pay | Admitting: Internal Medicine

## 2022-04-21 MED ORDER — AMOXICILLIN-POT CLAVULANATE 875-125 MG PO TABS: 1.0000 | ORAL_TABLET | Freq: Two times a day (BID) | ORAL | 0 refills | Status: DC

## 2022-04-21 NOTE — Progress Notes (Signed)
Internal Medicine Clinic Attending  Case discussed with Dr. Khan  At the time of the visit.  We reviewed the resident's history and exam and pertinent patient test results.  I agree with the assessment, diagnosis, and plan of care documented in the resident's note.  

## 2022-05-19 ENCOUNTER — Other Ambulatory Visit: Payer: Self-pay | Admitting: Internal Medicine

## 2022-05-19 DIAGNOSIS — J309 Allergic rhinitis, unspecified: Secondary | ICD-10-CM

## 2022-06-23 ENCOUNTER — Telehealth: Payer: BLUE CROSS/BLUE SHIELD | Admitting: Physician Assistant

## 2022-06-23 DIAGNOSIS — W548XXA Other contact with dog, initial encounter: Secondary | ICD-10-CM | POA: Diagnosis not present

## 2022-06-23 DIAGNOSIS — R21 Rash and other nonspecific skin eruption: Secondary | ICD-10-CM

## 2022-06-23 MED ORDER — AMOXICILLIN-POT CLAVULANATE 875-125 MG PO TABS: 1.0000 | ORAL_TABLET | Freq: Two times a day (BID) | ORAL | 0 refills | Status: DC

## 2022-06-23 NOTE — Progress Notes (Signed)
I have spent 5 minutes in review of e-visit questionnaire, review and updating patient chart, medical decision making and response to patient.   Lerin Jech Cody Lynde Ludwig, PA-C    

## 2022-06-23 NOTE — Progress Notes (Signed)
E Visit for Cellulitis  We are sorry that you are not feeling well. Here is how we plan to help!  Based on what you shared with me it looks like you have cellulitis.  Cellulitis looks like areas of skin redness, swelling, and warmth; it develops as a result of bacteria entering under the skin. Little red spots and/or bleeding can be seen in skin, and tiny surface sacs containing fluid can occur. Fever can be present. Cellulitis is almost always on one side of a body, and the lower limbs are the most common site of involvement.   I have prescribed:  Augmentin twice daily for 7 days.   HOME CARE:  Take your medications as ordered and take all of them, even if the skin irritation appears to be healing.   GET HELP RIGHT AWAY IF:  Symptoms that don't begin to go away within 48 hours. Severe redness persists or worsens If the area turns color, spreads or swells. If it blisters and opens, develops yellow-Welby crust or bleeds. You develop a fever or chills. If the pain increases or becomes unbearable.  Are unable to keep fluids and food down.  MAKE SURE YOU   Understand these instructions. Will watch your condition. Will get help right away if you are not doing well or get worse.  Thank you for choosing an e-visit.  Your e-visit answers were reviewed by a board certified advanced clinical practitioner to complete your personal care plan. Depending upon the condition, your plan could have included both over the counter or prescription medications.  Please review your pharmacy choice. Make sure the pharmacy is open so you can pick up prescription now. If there is a problem, you may contact your provider through [employer] and have the prescription routed to another pharmacy.  Your safety is important to [place]. If you have drug allergies check your prescription carefully.   For the next 24 hours you can use MyChart to ask questions about today's visit, request a non-urgent call back, or  ask for a work or school excuse. You will get an email in the next two days asking about your experience. I hope that your e-visit has been valuable and will speed your recovery.

## 2022-07-05 ENCOUNTER — Other Ambulatory Visit: Payer: Self-pay | Admitting: Internal Medicine

## 2022-07-05 DIAGNOSIS — I2510 Atherosclerotic heart disease of native coronary artery without angina pectoris: Secondary | ICD-10-CM

## 2022-07-05 DIAGNOSIS — I10 Essential (primary) hypertension: Secondary | ICD-10-CM

## 2022-07-05 DIAGNOSIS — E118 Type 2 diabetes mellitus with unspecified complications: Secondary | ICD-10-CM

## 2022-07-14 ENCOUNTER — Other Ambulatory Visit: Payer: Self-pay

## 2022-07-14 DIAGNOSIS — R079 Chest pain, unspecified: Secondary | ICD-10-CM

## 2022-07-14 DIAGNOSIS — J40 Bronchitis, not specified as acute or chronic: Secondary | ICD-10-CM

## 2022-07-14 MED ORDER — ISOSORBIDE MONONITRATE ER 30 MG PO TB24
15.0000 mg | ORAL_TABLET | Freq: Every day | ORAL | 0 refills | Status: DC
Start: 2022-07-14 — End: 2022-08-13

## 2022-07-29 ENCOUNTER — Other Ambulatory Visit: Payer: Self-pay | Admitting: Internal Medicine

## 2022-07-29 DIAGNOSIS — E785 Hyperlipidemia, unspecified: Secondary | ICD-10-CM

## 2022-08-12 ENCOUNTER — Other Ambulatory Visit: Payer: Self-pay

## 2022-08-12 MED ORDER — BENZONATATE 200 MG PO CAPS: 200.0000 mg | ORAL_CAPSULE | Freq: Two times a day (BID) | ORAL | 0 refills | Status: DC | PRN

## 2022-08-13 ENCOUNTER — Other Ambulatory Visit: Payer: Self-pay

## 2022-08-13 ENCOUNTER — Ambulatory Visit (INDEPENDENT_AMBULATORY_CARE_PROVIDER_SITE_OTHER): Payer: BLUE CROSS/BLUE SHIELD | Admitting: Internal Medicine

## 2022-08-13 ENCOUNTER — Encounter: Payer: Self-pay | Admitting: Internal Medicine

## 2022-08-13 VITALS — BP 132/65 | HR 71 | Temp 98.1°F | Ht 62.0 in | Wt 242.2 lb

## 2022-08-13 DIAGNOSIS — E785 Hyperlipidemia, unspecified: Secondary | ICD-10-CM | POA: Diagnosis not present

## 2022-08-13 DIAGNOSIS — E038 Other specified hypothyroidism: Secondary | ICD-10-CM | POA: Diagnosis not present

## 2022-08-13 DIAGNOSIS — B356 Tinea cruris: Secondary | ICD-10-CM | POA: Insufficient documentation

## 2022-08-13 DIAGNOSIS — L409 Psoriasis, unspecified: Secondary | ICD-10-CM

## 2022-08-13 DIAGNOSIS — Z794 Long term (current) use of insulin: Secondary | ICD-10-CM

## 2022-08-13 DIAGNOSIS — J309 Allergic rhinitis, unspecified: Secondary | ICD-10-CM

## 2022-08-13 DIAGNOSIS — R079 Chest pain, unspecified: Secondary | ICD-10-CM

## 2022-08-13 DIAGNOSIS — E118 Type 2 diabetes mellitus with unspecified complications: Secondary | ICD-10-CM | POA: Diagnosis not present

## 2022-08-13 DIAGNOSIS — Z7984 Long term (current) use of oral hypoglycemic drugs: Secondary | ICD-10-CM

## 2022-08-13 DIAGNOSIS — F17210 Nicotine dependence, cigarettes, uncomplicated: Secondary | ICD-10-CM

## 2022-08-13 DIAGNOSIS — E611 Iron deficiency: Secondary | ICD-10-CM

## 2022-08-13 DIAGNOSIS — L989 Disorder of the skin and subcutaneous tissue, unspecified: Secondary | ICD-10-CM

## 2022-08-13 DIAGNOSIS — Z8669 Personal history of other diseases of the nervous system and sense organs: Secondary | ICD-10-CM

## 2022-08-13 DIAGNOSIS — J40 Bronchitis, not specified as acute or chronic: Secondary | ICD-10-CM

## 2022-08-13 LAB — POCT GLYCOSYLATED HEMOGLOBIN (HGB A1C): Hemoglobin A1C: 8.3 % — AB (ref 4.0–5.6)

## 2022-08-13 LAB — GLUCOSE, CAPILLARY: Glucose-Capillary: 190 mg/dL — ABNORMAL HIGH (ref 70–99)

## 2022-08-13 MED ORDER — ROPINIROLE HCL 2 MG PO TABS
3.0000 mg | ORAL_TABLET | Freq: Every day | ORAL | 0 refills | Status: DC
Start: 2022-08-13 — End: 2022-10-13

## 2022-08-13 MED ORDER — ATORVASTATIN CALCIUM 80 MG PO TABS: 80.0000 mg | ORAL_TABLET | Freq: Every day | ORAL | 3 refills | Status: DC

## 2022-08-13 MED ORDER — METFORMIN HCL 1000 MG PO TABS: 1000.0000 mg | ORAL_TABLET | Freq: Two times a day (BID) | ORAL | 0 refills | Status: DC

## 2022-08-13 MED ORDER — SERTRALINE HCL 100 MG PO TABS: ORAL_TABLET | ORAL | 11 refills | Status: DC

## 2022-08-13 MED ORDER — CLOTRIMAZOLE 1 % EX OINT
TOPICAL_OINTMENT | CUTANEOUS | 1 refills | Status: DC
Start: 2022-08-13 — End: 2023-07-13

## 2022-08-13 MED ORDER — ISOSORBIDE MONONITRATE ER 30 MG PO TB24: 15.0000 mg | ORAL_TABLET | Freq: Every day | ORAL | 3 refills | Status: DC

## 2022-08-13 MED ORDER — CETIRIZINE HCL 10 MG PO TABS: 10.0000 mg | ORAL_TABLET | Freq: Every day | ORAL | 3 refills | Status: DC

## 2022-08-13 MED ORDER — LEVOTHYROXINE SODIUM 150 MCG PO TABS
ORAL_TABLET | ORAL | 3 refills | Status: DC
Start: 2022-08-13 — End: 2023-09-02

## 2022-08-13 NOTE — Progress Notes (Signed)
Subjective:   Patient ID: Kendra Adams female   DOB: 16-Jul-1960 62 y.o.   MRN: [id number]  HPI: [name] is a 62 y.o. female with past medical history outlined below here for follow up of diabetes and her other chronic medical conditions. For further details of today's visit, please refer to the assessment and plan below.   Past Medical History:  Diagnosis Date   Allergic rhinitis    Asthma    Atopic dermatitis    CAD (coronary artery disease)    a. 12/13/10: mRCA 99%, EF 65-70%;  s/p BMS to mRCA   COPD (chronic obstructive pulmonary disease) (HCC)    Coughing 03/17/2011   Depression    DM2 (diabetes mellitus, type 2) (HCC)    GERD (gastroesophageal reflux disease)    GI bleed    HTN (hypertension)    Hyperlipidemia    Hyperlipidemia    Hypothyroidism    Psoriasis    Seasonal allergies    Tobacco abuse    Current Outpatient Medications  Medication Sig Dispense Refill   Clotrimazole 1 % OINT Apply to affected area twice daily 56.7 g 1   ACCU-CHEK GUIDE test strip USE  STRIP TO CHECK GLUCOSE ONCE DAILY 50 each 0   Accu-Chek Softclix Lancets lancets USE   TO CHECK GLUCOSE ONCE DAILY 100 each 0   acetaminophen (TYLENOL) 325 MG tablet Take 2 tablets (650 mg total) by mouth every 6 (six) hours as needed for mild pain (or Fever >/= 101). 30 tablet 0   Adalimumab 40 MG/0.4ML PSKT Inject 40 mg into the skin every 14 (fourteen) days.     albuterol (VENTOLIN HFA) 108 (90 Base) MCG/ACT inhaler Inhale 1-2 puffs into the lungs every 6 (six) hours as needed for wheezing or shortness of breath. 1 each 2   amoxicillin-clavulanate (AUGMENTIN) 875-125 MG tablet Take 1 tablet by mouth 2 (two) times daily. 14 tablet 0   aspirin EC 81 MG tablet Take 1 tablet (81 mg total) by mouth daily. Swallow whole. 90 tablet 3   atorvastatin (LIPITOR) 80 MG tablet Take 1 tablet (80 mg total) by mouth daily. 90 tablet 3   benzonatate (TESSALON) 200 MG capsule Take 1 capsule (200 mg total) by  mouth 2 (two) times daily as needed for cough. 20 capsule 0   betamethasone dipropionate (DIPROLENE) 0.05 % cream Apply topically 2 (two) times daily. 45 g 2   buPROPion (WELLBUTRIN SR) 150 MG 12 hr tablet Take 1 tablet (150 mg total) by mouth 2 (two) times daily. 180 tablet 3   cetirizine (ZYRTEC) 10 MG tablet Take 1 tablet (10 mg total) by mouth daily. 90 tablet 3   ferrous sulfate 325 (65 FE) MG EC tablet Take 1 tablet by mouth once daily with breakfast 90 tablet 1   fluconazole (DIFLUCAN) 150 MG tablet Take 1 tablet (150 mg total) by mouth every 3 (three) days as needed. 2 tablet 0   fluticasone (FLONASE) 50 MCG/ACT nasal spray Place 2 sprays into both nostrils daily. 16 g 2   fluticasone-salmeterol (ADVAIR DISKUS) 100-50 MCG/ACT AEPB Inhale 1 puff into the lungs 2 (two) times daily. 60 each 0   Insulin Pen Needle 32G X 4 MM MISC Use daily as diretced 100 each 1   isosorbide mononitrate (IMDUR) 30 MG 24 hr tablet Take 0.5 tablets (15 mg total) by mouth daily. 45 tablet 3   levothyroxine (SYNTHROID) 150 MCG tablet TAKE 1 TABLET BY MOUTH BEFORE BREAKFAST 90 tablet 3  losartan (COZAAR) 100 MG tablet Take 0.5 tablets (50 mg total) by mouth at bedtime. 45 tablet 3   meclizine (ANTIVERT) 25 MG tablet Take 25 mg by mouth as needed for dizziness.     metFORMIN (GLUCOPHAGE) 1000 MG tablet Take 1 tablet (1,000 mg total) by mouth 2 (two) times daily. 60 tablet 0   metoprolol succinate (TOPROL-XL) 100 MG 24 hr tablet TAKE WITH OR IMMEDIATELY FOLLOWING A MEAL 90 tablet 0   nitroGLYCERIN (NITROSTAT) 0.4 MG SL tablet DISSOLVE ONE TABLET UNDER THE TONGUE EVERY 5 MINUTES AS NEEDED FOR CHEST PAIN.  DO NOT EXCEED A TOTAL OF 3 DOSES IN 15 MINUTES...CALL 911 25 tablet 3   omeprazole (PRILOSEC) 40 MG capsule Take 1 capsule by mouth once daily 90 capsule 3   ondansetron (ZOFRAN) 4 MG tablet Take 1 tablet (4 mg total) by mouth every 8 (eight) hours as needed for nausea or vomiting. 4 tablet 0   predniSONE (DELTASONE)  20 MG tablet Take 1 tablet (20 mg total) by mouth daily with breakfast. 7 tablet 0   rOPINIRole (REQUIP) 2 MG tablet Take 1.5 tablets (3 mg total) by mouth at bedtime. 90 tablet 0   Semaglutide,0.25 or 0.5MG /DOS, 2 MG/3ML SOPN Inject 0.25 mg into the skin once a week. 3 mL 0   sertraline (ZOLOFT) 100 MG tablet Take 1 and 1/2 tablets by mouth once daily 45 tablet 11   Spacer/Aero-Holding Chambers (AEROCHAMBER PLUS) inhaler Use as instructed 1 each 2   No current facility-administered medications for this visit.   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  Vaping Use   Vaping Use: Some days  Substance and Sexual Activity   Alcohol use: No    Alcohol/week: 0.0 standard drinks of alcohol   Drug use: Yes    Frequency: 7.0 times per week    Types: Marijuana    Comment: Marijuana Use   Sexual activity: Not on file  Other Topics Concern   Not on file  Social History Narrative   Father died of suicide (gunshot) in 16. 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       Patient lives with her husband, her husband's niece Kendra Adams who is mentally retarded and her best friend and the friend's daughter and 6  dogs               Social Determinants of Health   Financial Resource Strain: High Risk (01/15/2022)   Overall Financial Resource  Strain (CARDIA)    Difficulty of Paying Living Expenses: Very hard  Food Insecurity: No Food Insecurity (01/15/2022)   Hunger Vital Sign    Worried About Running Out of Food in the Last Year: Never true    Ran Out of Food in the Last Year: Never true  Transportation Needs: No Transportation Needs (01/15/2022)   PRAPARE - Administrator, Civil Service (Medical): No    Lack of Transportation (Non-Medical): No  Physical Activity: Insufficiently Active (01/15/2022)   Exercise Vital Sign    Days of Exercise per Week: 3 days    Minutes of Exercise per Session: 40 min  Stress: No Stress Concern Present (01/15/2022)   [employer] of Occupational Health - Occupational Stress Questionnaire    Feeling of Stress : Only a little  Social Connections: Socially Isolated (01/15/2022)   Social Connection and Isolation Panel [NHANES]    Frequency of Communication with Friends and Family: Once a week    Frequency of Social Gatherings with Friends and Family: Once a week    Attends Religious Services: Never    Database administrator or Organizations: No    Attends Engineer, structural: Never    Marital Status: Married     Objective:  Physical Exam:  Vitals:   08/13/22 1048  BP: 132/65  Pulse: 71  Temp: 98.1 F (36.7 C)  TempSrc: Oral  SpO2: 95%  Weight: 242 lb 3.2 oz (109.9 kg)  Height: 5\' 2"  (1.575 m)    Constitutional: NAD, well appearing, obese Cardiovascular: RRR, no m/r/g Pulmonary/Chest: Clear bilaterally, normal effort Skin: Pink patches of dry scaly skin covering the extensor surface of both arms and knees. Right forehead lesion with non healing scab.    Assessment & Plan:   Tinea cruris Has been using nystatin powder without improvement. Sent Rx for topical clotrimazole.   Scalp lesion Patient has a non healing scab  on her right forehead that has been present for 1 year. Referral placed to dermatology for biopsy to rule out squamous cell carcinoma.   Iron deficiency Rechecking ferritin. Currently taking ferrous sulfate daily.   Hypothyroidism On synthroid 150 mcg; TSH previously at goal. Rechecking today.   HLD (hyperlipidemia) Last lipid panel with LDL at goal. Continue Atorvastatin 80 mg daily; refills sent to pharmacy.   History of restless legs syndrome Continues to have uncontrolled symptoms. We are repeating ferritin to f/u her IDA which can contribute. Otherwise plan to increase ropinirole to 3 mg QHS. Instructed patient not to take more than once a day (admitted to occasional BID use).   Diabetes mellitus type 2 with complications (HCC) Chronic and uncontrolled, Hgb A1c today is 8.3. She is taking metformin 1,000 BID. Not a candidate for SGLT-2 because of recurrent UTI / yeast infections. At her last visit, she was having difficulty with remembering to take her victoza daily and was switched to once weekly ozempic to help with compliance. She was unaware of this change and has continued to take vicotza, but only a couple of times a week. She does not know what dose she is using. Advise she increase to once a day. Also discussed her diet. She drinks ~3 sodas a day. She will work on cutting back and switching to diet soda. Otherwise, follow up in 3 months.   Psoriasis Inquiring about oral prednisone but not currently using any topicals. Advised OTC hydrocortisone cream BID for now; if no improvement I will send rx for triamcinolone. Advised that  we avoid oral glucocorticoids if possible with her uncontrolled DM. I am also referring her to dermatology for her non healing scalp lesion; appreciate assistance with her psoriasis as well.

## 2022-08-13 NOTE — Assessment & Plan Note (Signed)
On synthroid 150 mcg; TSH previously at goal. Rechecking today.

## 2022-08-13 NOTE — Assessment & Plan Note (Signed)
Last lipid panel with LDL at goal. Continue Atorvastatin 80 mg daily; refills sent to pharmacy.

## 2022-08-13 NOTE — Patient Instructions (Signed)
Kendra Adams,  It was a pleasure to see you. Please continue to take your medicins as prescribed. Follow up with me again in 3 months. If you have any questions or concerns, call our clinic at [phone] or after hours call [phone] and ask for the internal medicine resident on call.   Thank you!  DR. G

## 2022-08-13 NOTE — Assessment & Plan Note (Signed)
Continues to have uncontrolled symptoms. We are repeating ferritin to f/u her IDA which can contribute. Otherwise plan to increase ropinirole to 3 mg QHS. Instructed patient not to take more than once a day (admitted to occasional BID use).

## 2022-08-13 NOTE — Assessment & Plan Note (Signed)
Patient has a non healing scab on her right forehead that has been present for 1 year. Referral placed to dermatology for biopsy to rule out squamous cell carcinoma.

## 2022-08-13 NOTE — Assessment & Plan Note (Signed)
Inquiring about oral prednisone but not currently using any topicals. Advised OTC hydrocortisone cream BID for now; if no improvement I will send rx for triamcinolone. Advised that we avoid oral glucocorticoids if possible with her uncontrolled DM. I am also referring her to dermatology for her non healing scalp lesion; appreciate assistance with her psoriasis as well.

## 2022-08-13 NOTE — Assessment & Plan Note (Signed)
Rechecking ferritin. Currently taking ferrous sulfate daily.

## 2022-08-13 NOTE — Assessment & Plan Note (Signed)
Has been using nystatin powder without improvement. Sent Rx for topical clotrimazole.

## 2022-08-13 NOTE — Assessment & Plan Note (Signed)
Chronic and uncontrolled, Hgb A1c today is 8.3. She is taking metformin 1,000 BID. Not a candidate for SGLT-2 because of recurrent UTI / yeast infections. At her last visit, she was having difficulty with remembering to take her victoza daily and was switched to once weekly ozempic to help with compliance. She was unaware of this change and has continued to take vicotza, but only a couple of times a week. She does not know what dose she is using. Advise she increase to once a day. Also discussed her diet. She drinks ~3 sodas a day. She will work on cutting back and switching to diet soda. Otherwise, follow up in 3 months.

## 2022-08-30 ENCOUNTER — Other Ambulatory Visit: Payer: Self-pay | Admitting: Internal Medicine

## 2022-08-30 DIAGNOSIS — E1159 Type 2 diabetes mellitus with other circulatory complications: Secondary | ICD-10-CM

## 2022-09-27 ENCOUNTER — Other Ambulatory Visit: Payer: Self-pay | Admitting: Internal Medicine

## 2022-09-27 DIAGNOSIS — E118 Type 2 diabetes mellitus with unspecified complications: Secondary | ICD-10-CM

## 2022-09-27 DIAGNOSIS — D62 Acute posthemorrhagic anemia: Secondary | ICD-10-CM

## 2022-10-03 ENCOUNTER — Other Ambulatory Visit: Payer: Self-pay | Admitting: Internal Medicine

## 2022-10-03 DIAGNOSIS — E118 Type 2 diabetes mellitus with unspecified complications: Secondary | ICD-10-CM

## 2022-10-03 MED ORDER — ACCU-CHEK SOFTCLIX LANCETS MISC
0 refills | Status: DC
Start: 2022-10-03 — End: 2023-07-13

## 2022-10-03 MED ORDER — ACCU-CHEK GUIDE VI STRP
ORAL_STRIP | 0 refills | Status: AC
Start: 2022-10-03 — End: ?

## 2022-10-11 ENCOUNTER — Other Ambulatory Visit: Payer: Self-pay | Admitting: Internal Medicine

## 2022-10-11 DIAGNOSIS — Z8669 Personal history of other diseases of the nervous system and sense organs: Secondary | ICD-10-CM

## 2022-10-13 NOTE — Telephone Encounter (Signed)
Please have patient schedule follow up with me. Thanks.  

## 2022-10-17 ENCOUNTER — Other Ambulatory Visit: Payer: Self-pay | Admitting: Internal Medicine

## 2022-10-21 ENCOUNTER — Other Ambulatory Visit: Payer: Self-pay

## 2022-10-21 DIAGNOSIS — I2510 Atherosclerotic heart disease of native coronary artery without angina pectoris: Secondary | ICD-10-CM

## 2022-10-21 DIAGNOSIS — I10 Essential (primary) hypertension: Secondary | ICD-10-CM

## 2022-10-21 DIAGNOSIS — E118 Type 2 diabetes mellitus with unspecified complications: Secondary | ICD-10-CM

## 2022-10-21 MED ORDER — METOPROLOL SUCCINATE ER 100 MG PO TB24
ORAL_TABLET | ORAL | 0 refills | Status: DC
Start: 2022-10-21 — End: 2023-01-09

## 2022-10-22 ENCOUNTER — Other Ambulatory Visit: Payer: Self-pay | Admitting: Internal Medicine

## 2022-10-22 NOTE — Telephone Encounter (Signed)
Call to check on need for cough meds.  States has a cough during the day that states off as dark then goes to creamy white.  Cough more at night.  Does not take everyday.  Has 2 tablets left.  Concerned about spot on forehead.  Does not have an appointment until 07/2022.  Would like to get sooner if possible.  Will forward to C. Lissa Hoard to see if she can get an earlier appointment.

## 2022-11-12 ENCOUNTER — Other Ambulatory Visit (HOSPITAL_COMMUNITY): Payer: Self-pay | Admitting: Gastroenterology

## 2022-11-12 ENCOUNTER — Ambulatory Visit (INDEPENDENT_AMBULATORY_CARE_PROVIDER_SITE_OTHER): Payer: BLUE CROSS/BLUE SHIELD | Admitting: Internal Medicine

## 2022-11-12 ENCOUNTER — Encounter: Payer: Self-pay | Admitting: Internal Medicine

## 2022-11-12 VITALS — BP 135/78 | HR 75 | Temp 98.1°F | Wt 233.4 lb

## 2022-11-12 DIAGNOSIS — F17210 Nicotine dependence, cigarettes, uncomplicated: Secondary | ICD-10-CM

## 2022-11-12 DIAGNOSIS — E038 Other specified hypothyroidism: Secondary | ICD-10-CM | POA: Diagnosis not present

## 2022-11-12 DIAGNOSIS — E1159 Type 2 diabetes mellitus with other circulatory complications: Secondary | ICD-10-CM

## 2022-11-12 DIAGNOSIS — K5190 Ulcerative colitis, unspecified, without complications: Secondary | ICD-10-CM | POA: Diagnosis not present

## 2022-11-12 DIAGNOSIS — E118 Type 2 diabetes mellitus with unspecified complications: Secondary | ICD-10-CM

## 2022-11-12 DIAGNOSIS — I152 Hypertension secondary to endocrine disorders: Secondary | ICD-10-CM

## 2022-11-12 DIAGNOSIS — Z8669 Personal history of other diseases of the nervous system and sense organs: Secondary | ICD-10-CM

## 2022-11-12 DIAGNOSIS — R1310 Dysphagia, unspecified: Secondary | ICD-10-CM

## 2022-11-12 DIAGNOSIS — Z23 Encounter for immunization: Secondary | ICD-10-CM

## 2022-11-12 DIAGNOSIS — K219 Gastro-esophageal reflux disease without esophagitis: Secondary | ICD-10-CM

## 2022-11-12 DIAGNOSIS — E611 Iron deficiency: Secondary | ICD-10-CM

## 2022-11-12 DIAGNOSIS — L989 Disorder of the skin and subcutaneous tissue, unspecified: Secondary | ICD-10-CM

## 2022-11-12 DIAGNOSIS — F17200 Nicotine dependence, unspecified, uncomplicated: Secondary | ICD-10-CM

## 2022-11-12 DIAGNOSIS — E785 Hyperlipidemia, unspecified: Secondary | ICD-10-CM

## 2022-11-12 DIAGNOSIS — R053 Chronic cough: Secondary | ICD-10-CM

## 2022-11-12 MED ORDER — OMEPRAZOLE 40 MG PO CPDR
40.0000 mg | DELAYED_RELEASE_CAPSULE | Freq: Every day | ORAL | 3 refills | Status: DC
Start: 2022-11-12 — End: 2022-11-12

## 2022-11-12 MED ORDER — OMEPRAZOLE 40 MG PO CPDR
40.0000 mg | DELAYED_RELEASE_CAPSULE | Freq: Every day | ORAL | 3 refills | Status: AC
Start: 2022-11-12 — End: ?

## 2022-11-12 NOTE — Progress Notes (Signed)
Subjective:   Patient ID: Kendra Adams female   DOB: 1960/05/21 63 y.o.   MRN: [id number]  HPI: Kendra Adams is a 62 y.o. female with past medical history outlined below here for follow up of her chronic conditions. For further details of today's visit, please refer to the assessment and plan below.   Past Medical History:  Diagnosis Date   Allergic rhinitis    Asthma    Atopic dermatitis    CAD (coronary artery disease)    a. 12/13/10: mRCA 99%, EF 65-70%;  s/p BMS to mRCA   COPD (chronic obstructive pulmonary disease) (HCC)    Coughing 03/17/2011   Depression    DM2 (diabetes mellitus, type 2) (HCC)    GERD (gastroesophageal reflux disease)    GI bleed    HTN (hypertension)    Hyperlipidemia    Hyperlipidemia    Hypothyroidism    Psoriasis    Seasonal allergies    Tobacco abuse    Current Outpatient Medications  Medication Sig Dispense Refill   Accu-Chek Softclix Lancets lancets USE   TO CHECK GLUCOSE ONCE DAILY 100 each 0   acetaminophen (TYLENOL) 325 MG tablet Take 2 tablets (650 mg total) by mouth every 6 (six) hours as needed for mild pain (or Fever >/= 101). 30 tablet 0   Adalimumab 40 MG/0.4ML PSKT Inject 40 mg into the skin every 14 (fourteen) days.     albuterol (VENTOLIN HFA) 108 (90 Base) MCG/ACT inhaler INHALE 1 TO 2 PUFFS BY MOUTH EVERY 6 HOURS AS NEEDED FOR WHEEZING AND FOR SHORTNESS OF BREATH 18 g 0   aspirin EC 81 MG tablet Take 1 tablet (81 mg total) by mouth daily. Swallow whole. 90 tablet 3   atorvastatin (LIPITOR) 80 MG tablet Take 1 tablet (80 mg total) by mouth daily. 90 tablet 3   benzonatate (TESSALON) 200 MG capsule TAKE 1 CAPSULE BY MOUTH TWICE DAILY AS NEEDED FOR COUGH 20 capsule 0   betamethasone dipropionate (DIPROLENE) 0.05 % cream Apply topically 2 (two) times daily. 45 g 2   buPROPion (WELLBUTRIN SR) 150 MG 12 hr tablet Take 1 tablet (150 mg total) by mouth 2 (two) times daily. 180 tablet 3   cetirizine (ZYRTEC) 10 MG tablet Take 1  tablet (10 mg total) by mouth daily. 90 tablet 3   Clotrimazole 1 % OINT Apply to affected area twice daily 56.7 g 1   ferrous sulfate 325 (65 FE) MG EC tablet Take 1 tablet by mouth once daily with breakfast 90 tablet 0   fluconazole (DIFLUCAN) 150 MG tablet Take 1 tablet (150 mg total) by mouth every 3 (three) days as needed. 2 tablet 0   fluticasone (FLONASE) 50 MCG/ACT nasal spray Place 2 sprays into both nostrils daily. 16 g 2   fluticasone-salmeterol (ADVAIR DISKUS) 100-50 MCG/ACT AEPB Inhale 1 puff into the lungs 2 (two) times daily. 60 each 0   glucose blood (ACCU-CHEK GUIDE) test strip Use as instructed 50 each 0   Insulin Pen Needle 32G X 4 MM MISC Use daily as diretced 100 each 1   isosorbide mononitrate (IMDUR) 30 MG 24 hr tablet Take 0.5 tablets (15 mg total) by mouth daily. 45 tablet 3   levothyroxine (SYNTHROID) 150 MCG tablet TAKE 1 TABLET BY MOUTH BEFORE BREAKFAST 90 tablet 3   losartan (COZAAR) 100 MG tablet TAKE 1 TABLET BY MOUTH AT BEDTIME 30 tablet 3   meclizine (ANTIVERT) 25 MG tablet Take 25 mg by mouth as needed  for dizziness.     metFORMIN (GLUCOPHAGE) 1000 MG tablet Take 1 tablet by mouth twice daily 60 tablet 11   metoprolol succinate (TOPROL-XL) 100 MG 24 hr tablet Take with or immediately following a meal. 90 tablet 0   nitroGLYCERIN (NITROSTAT) 0.4 MG SL tablet DISSOLVE ONE TABLET UNDER THE TONGUE EVERY 5 MINUTES AS NEEDED FOR CHEST PAIN.  DO NOT EXCEED A TOTAL OF 3 DOSES IN 15 MINUTES...CALL 911 25 tablet 3   omeprazole (PRILOSEC) 40 MG capsule Take 1 capsule (40 mg total) by mouth daily. 90 capsule 3   ondansetron (ZOFRAN) 4 MG tablet Take 1 tablet (4 mg total) by mouth every 8 (eight) hours as needed for nausea or vomiting. 4 tablet 0   rOPINIRole (REQUIP) 2 MG tablet TAKE 1 & 1/2 (ONE & ONE-HALF) TABLETS BY MOUTH ONCE DAILY AT BEDTIME 90 tablet 0   Semaglutide,0.25 or 0.5MG /DOS, 2 MG/3ML SOPN Inject 0.25 mg into the skin once a week. 3 mL 0   sertraline (ZOLOFT)  100 MG tablet Take 1 and 1/2 tablets by mouth once daily 45 tablet 11   Spacer/Aero-Holding Chambers (AEROCHAMBER PLUS) inhaler Use as instructed 1 each 2   No current facility-administered medications for this visit.   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  Vaping Use   Vaping status: Some Days  Substance and Sexual Activity   Alcohol use: No    Alcohol/week: 0.0 standard drinks of alcohol   Drug use: Yes    Frequency: 7.0 times per week    Types: Marijuana    Comment: Marijuana Use   Sexual activity: Not on file  Other Topics Concern   Not on file  Social History Narrative   Father died of suicide (gunshot) in 77. 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       Patient lives with her husband, her husband's niece Tammy who is mentally retarded and her best friend and the friend's daughter and 6 dogs               Social Determinants of Health   Financial Resource Strain: High Risk (01/15/2022)   Overall Financial Resource Strain (CARDIA)    Difficulty of Paying Living Expenses: Very hard  Food Insecurity: No Food Insecurity (01/15/2022)   Hunger Vital  Sign    Worried About Running Out of Food in the Last Year: Never true    Ran Out of Food in the Last Year: Never  true  Transportation Needs: No Transportation Needs (01/15/2022)   PRAPARE - Administrator, Civil Service (Medical): No    Lack of Transportation (Non-Medical): No  Physical Activity: Insufficiently Active (01/15/2022)   Exercise Vital Sign    Days of Exercise per Week: 3 days    Minutes of Exercise per Session: 40 min  Stress: No Stress Concern Present (01/15/2022)   [employer] of Occupational Health - Occupational Stress Questionnaire    Feeling of Stress : Only a little  Social Connections: Socially Isolated (01/15/2022)   Social Connection and Isolation Panel [NHANES]    Frequency of Communication with Friends and Family: Once a week    Frequency of Social Gatherings with Friends and Family: Once a week    Attends Religious Services: Never    Database administrator or Organizations: No    Attends Engineer, structural: Never    Marital Status: Married     Objective:  Physical Exam:  Vitals:   11/12/22 1009  BP: 135/78  Pulse: 75  Temp: 98.1 F (36.7 C)  TempSrc: Oral  SpO2: 97%  Weight: 233 lb 6.4 oz (105.9 kg)    Constitutional: NAD, well appearing, obese Cardiovascular: RRR, no m/r/g Pulmonary/Chest: Clear bilaterally, normal effort Skin: Pink patches of dry scaly skin covering the extensor surface of both arms and knees. Right forehead lesion with non healing scab.   Assessment & Plan:   Ulcerative colitis (HCC) On humira, follows with Dr. Loreta Ave. Sent labs per GI request - CBC, CMP, and Quant gold for ongoing treatment / patient assistance.   Tobacco use disorder Continues to smoke ~1 cigarette a day. Counseled on cessation.   Iron deficiency Rechecking ferritin.   Hypothyroidism Doing well on synthroid, rechecking TSH.   Hypertension associated with diabetes (HCC) Chronic and well controlled. Continue regimen.    HLD (hyperlipidemia) Continue lipitor 80, rechecking lipid panel today.   History of restless legs syndrome Improved on increased dose of ropinirole. Checking ferritin as well given hx of IDA on iron supplements.   GERD Refilled omeprazole.   Diabetes mellitus type 2 with complications (HCC) Chronically uncontrolled, checking Hgb A1c as send off (current POC machine is down). Will f/u. She has her meter today however her husband has been using this and her readings are not accurate. She is taking metformin 1000 mg BID and victoza intermittently. Was not taking it for "awhile" but recently started. However only takes 2-3 times a week. I had switched her to once weekly ozempic at her last appointment to help with compliance but she did not remember this. She will send me a mychart message with her current victoza dose. Pending her Hgb A1c we will plan to transition her to ozempic to help with compliance. Checking urine microalbumin.   Cough Chronic cough in a smoker with worsening dyspnea. Suspect COPD or chronic bronchitis. Patient did not follow up for PFTs previously ordered in February. I am reordering these. Advise smoking cessation. Will likely benefit from a maintenance inhaler pending PFT results.   Scalp lesion Scheduled for dermatology evaluation in May. Stable from my last exam.

## 2022-11-12 NOTE — Assessment & Plan Note (Signed)
Rechecking ferritin.

## 2022-11-12 NOTE — Patient Instructions (Addendum)
Kendra Adams,  It was a pleasure to see you today. Please continue to take your medicine as prescribed. Send me a message and let me know what dose of victoza you are taking.   I will send you a mychart message with your lab results.   Follow up with me again in 3 months. If you have any questions or concerns, call our clinic at [phone] or after hours call [phone] and ask for the internal medicine resident on call.   Thank you!  Dr. Reece Agar

## 2022-11-12 NOTE — Assessment & Plan Note (Signed)
Doing well on synthroid, rechecking TSH.

## 2022-11-12 NOTE — Assessment & Plan Note (Signed)
Continues to smoke ~1 cigarette a day. Counseled on cessation.

## 2022-11-12 NOTE — Assessment & Plan Note (Signed)
Improved on increased dose of ropinirole. Checking ferritin as well given hx of IDA on iron supplements.

## 2022-11-12 NOTE — Assessment & Plan Note (Signed)
Scheduled for dermatology evaluation in May. Stable from my last exam.

## 2022-11-12 NOTE — Assessment & Plan Note (Signed)
On humira, follows with Dr. Loreta Ave. Sent labs per GI request - CBC, CMP, and Quant gold for ongoing treatment / patient assistance.

## 2022-11-12 NOTE — Assessment & Plan Note (Signed)
Refilled omeprazole.

## 2022-11-12 NOTE — Assessment & Plan Note (Signed)
Continue lipitor 80, rechecking lipid panel today.

## 2022-11-12 NOTE — Assessment & Plan Note (Addendum)
Chronically uncontrolled, checking Hgb A1c as send off (current POC machine is down). Will f/u. She has her meter today however her husband has been using this and her readings are not accurate. She is taking metformin 1000 mg BID and victoza intermittently. Was not taking it for "awhile" but recently started. However only takes 2-3 times a week. I had switched her to once weekly ozempic at her last appointment to help with compliance but she did not remember this. She will send me a mychart message with her current victoza dose. Pending her Hgb A1c we will plan to transition her to ozempic to help with compliance. Checking urine microalbumin.

## 2022-11-12 NOTE — Assessment & Plan Note (Signed)
Chronic cough in a smoker with worsening dyspnea. Suspect COPD or chronic bronchitis. Patient did not follow up for PFTs previously ordered in February. I am reordering these. Advise smoking cessation. Will likely benefit from a maintenance inhaler pending PFT results.

## 2022-11-12 NOTE — Assessment & Plan Note (Signed)
Chronic and well controlled. Continue regimen.

## 2022-11-13 LAB — CBC WITH DIFFERENTIAL/PLATELET
Basophils Absolute: 0.1 10*3/uL (ref 0.0–0.2)
Basos: 1 %
EOS (ABSOLUTE): 0.4 10*3/uL (ref 0.0–0.4)
Eos: 6 %
Hematocrit: 41 % (ref 34.0–46.6)
Hemoglobin: 13.7 g/dL (ref 11.1–15.9)
Immature Grans (Abs): 0 10*3/uL (ref 0.0–0.1)
Immature Granulocytes: 0 %
Lymphocytes Absolute: 2 10*3/uL (ref 0.7–3.1)
Lymphs: 27 %
MCH: 30.6 pg (ref 26.6–33.0)
MCHC: 33.4 g/dL (ref 31.5–35.7)
MCV: 92 fL (ref 79–97)
Monocytes Absolute: 0.9 10*3/uL (ref 0.1–0.9)
Monocytes: 12 %
Neutrophils Absolute: 3.9 10*3/uL (ref 1.4–7.0)
Neutrophils: 54 %
Platelets: 218 10*3/uL (ref 150–450)
RBC: 4.47 x10E6/uL (ref 3.77–5.28)
RDW: 13 % (ref 11.7–15.4)
WBC: 7.1 10*3/uL (ref 3.4–10.8)

## 2022-11-13 LAB — CMP14 + ANION GAP
ALT: 15 IU/L (ref 0–32)
AST: 19 IU/L (ref 0–40)
Albumin: 4.2 g/dL (ref 3.9–4.9)
Alkaline Phosphatase: 97 IU/L (ref 44–121)
Anion Gap: 13 mmol/L (ref 10.0–18.0)
BUN/Creatinine Ratio: 11 — ABNORMAL LOW (ref 12–28)
BUN: 10 mg/dL (ref 8–27)
Bilirubin Total: 0.3 mg/dL (ref 0.0–1.2)
CO2: 26 mmol/L (ref 20–29)
Calcium: 9.6 mg/dL (ref 8.7–10.3)
Chloride: 101 mmol/L (ref 96–106)
Creatinine, Ser: 0.92 mg/dL (ref 0.57–1.00)
Globulin, Total: 2.7 g/dL (ref 1.5–4.5)
Glucose: 160 mg/dL — ABNORMAL HIGH (ref 70–99)
Potassium: 4.5 mmol/L (ref 3.5–5.2)
Sodium: 140 mmol/L (ref 134–144)
Total Protein: 6.9 g/dL (ref 6.0–8.5)
eGFR: 70 mL/min/{1.73_m2} (ref >59)

## 2022-11-13 LAB — HEMOGLOBIN A1C
Est. average glucose Bld gHb Est-mCnc: 183 mg/dL
Hgb A1c MFr Bld: 8 % — ABNORMAL HIGH (ref 4.8–5.6)

## 2022-11-13 LAB — LIPID PANEL
Chol/HDL Ratio: 4.5 ratio — ABNORMAL HIGH (ref 0.0–4.4)
Cholesterol, Total: 134 mg/dL (ref 100–199)
HDL: 30 mg/dL — ABNORMAL LOW (ref >39)
LDL Chol Calc (NIH): 71 mg/dL (ref 0–99)
Triglycerides: 195 mg/dL — ABNORMAL HIGH (ref 0–149)
VLDL Cholesterol Cal: 33 mg/dL (ref 5–40)

## 2022-11-13 LAB — TSH: TSH: 1.26 u[IU]/mL (ref 0.450–4.500)

## 2022-11-13 LAB — MICROALBUMIN / CREATININE URINE RATIO
Creatinine, Urine: 211.9 mg/dL
Microalb/Creat Ratio: 23 mg/g{creat} (ref 0–29)
Microalbumin, Urine: 48.6 ug/mL

## 2022-11-13 LAB — FERRITIN: Ferritin: 68 ng/mL (ref 15–150)

## 2022-11-15 LAB — QUANTIFERON-TB GOLD PLUS
QuantiFERON Mitogen Value: >10 [IU]/mL
QuantiFERON Nil Value: 0.25 [IU]/mL
QuantiFERON TB1 Ag Value: 0.26 [IU]/mL
QuantiFERON TB2 Ag Value: 0.29 [IU]/mL
QuantiFERON-TB Gold Plus: NEGATIVE

## 2022-11-19 NOTE — Addendum Note (Signed)
Addended by: Bufford Spikes on: 11/19/2022 01:58 PM   Modules accepted: Orders

## 2022-11-28 ENCOUNTER — Ambulatory Visit (HOSPITAL_COMMUNITY)
Admission: RE | Admit: 2022-11-28 | Discharge: 2022-11-28 | Disposition: A | Discharge status: Home / Self Care | Payer: BLUE CROSS/BLUE SHIELD | Source: Ambulatory Visit | Attending: Gastroenterology | Admitting: Gastroenterology

## 2022-11-28 DIAGNOSIS — R1310 Dysphagia, unspecified: Secondary | ICD-10-CM | POA: Insufficient documentation

## 2022-12-19 ENCOUNTER — Other Ambulatory Visit: Payer: Self-pay | Admitting: Internal Medicine

## 2022-12-19 DIAGNOSIS — Z8669 Personal history of other diseases of the nervous system and sense organs: Secondary | ICD-10-CM

## 2023-01-09 ENCOUNTER — Other Ambulatory Visit: Payer: Self-pay | Admitting: Internal Medicine

## 2023-01-09 DIAGNOSIS — E118 Type 2 diabetes mellitus with unspecified complications: Secondary | ICD-10-CM

## 2023-01-09 DIAGNOSIS — I2510 Atherosclerotic heart disease of native coronary artery without angina pectoris: Secondary | ICD-10-CM

## 2023-01-09 DIAGNOSIS — I10 Essential (primary) hypertension: Secondary | ICD-10-CM

## 2023-01-09 NOTE — Telephone Encounter (Signed)
Please have her schedule follow up with  me, next available is fine. Thanks.

## 2023-01-30 ENCOUNTER — Other Ambulatory Visit: Payer: Self-pay | Admitting: Nurse Practitioner

## 2023-01-30 DIAGNOSIS — I2510 Atherosclerotic heart disease of native coronary artery without angina pectoris: Secondary | ICD-10-CM

## 2023-02-28 ENCOUNTER — Other Ambulatory Visit: Payer: Self-pay | Admitting: Internal Medicine

## 2023-02-28 DIAGNOSIS — D62 Acute posthemorrhagic anemia: Secondary | ICD-10-CM

## 2023-03-02 NOTE — Telephone Encounter (Signed)
Next appt scheduled 03/25/23 with PCP. Ferritin level 68 om 11/12/22.

## 2023-03-25 ENCOUNTER — Encounter: Payer: BLUE CROSS/BLUE SHIELD | Admitting: Internal Medicine

## 2023-03-27 ENCOUNTER — Telehealth: Payer: Self-pay | Admitting: *Deleted

## 2023-03-27 NOTE — Telephone Encounter (Signed)
Called pt - stated she felled and hurt her right shoulder; also has bruises right leg/groin area. Stated she's currently driving out of town. Stated she does not think she broke her shoulder b/c she can move it. Advised pt to call on Tuesday if an appt is needed - stated she will.

## 2023-03-27 NOTE — Telephone Encounter (Signed)
Please refer to message below.  ----- Message -----  From: Kendra Boom "[name]"  Sent: 03/26/2023   4:24 PM EST  To: Imp Front Desk Pool  Subject: Appointment Request                              Appointment Request From: Kendra Boom    With Provider: Reymundo Poll [facility] [place]]    Preferred Date Range: Any    Preferred Times: Any Time    Reason for visit: Office Visit    Health Maintenance Topic:    Comments:  Larey Seat and hurt Right Shoulder . I think it should get  X-ray  rite away I won't be able to get it x-rayed until next Wednesday.

## 2023-04-01 ENCOUNTER — Telehealth: Payer: Self-pay

## 2023-04-01 ENCOUNTER — Ambulatory Visit: Payer: BLUE CROSS/BLUE SHIELD | Admitting: Student

## 2023-04-01 VITALS — BP 129/84 | HR 80 | Temp 98.7°F | Ht 62.0 in | Wt 228.8 lb

## 2023-04-01 DIAGNOSIS — K5190 Ulcerative colitis, unspecified, without complications: Secondary | ICD-10-CM

## 2023-04-01 DIAGNOSIS — J449 Chronic obstructive pulmonary disease, unspecified: Secondary | ICD-10-CM | POA: Diagnosis not present

## 2023-04-01 DIAGNOSIS — E118 Type 2 diabetes mellitus with unspecified complications: Secondary | ICD-10-CM

## 2023-04-01 DIAGNOSIS — Z7984 Long term (current) use of oral hypoglycemic drugs: Secondary | ICD-10-CM

## 2023-04-01 DIAGNOSIS — J309 Allergic rhinitis, unspecified: Secondary | ICD-10-CM | POA: Diagnosis not present

## 2023-04-01 DIAGNOSIS — M25511 Pain in right shoulder: Secondary | ICD-10-CM

## 2023-04-01 LAB — POCT GLYCOSYLATED HEMOGLOBIN (HGB A1C): Hemoglobin A1C: 7 % — AB (ref 4.0–5.6)

## 2023-04-01 LAB — GLUCOSE, CAPILLARY: Glucose-Capillary: 113 mg/dL — ABNORMAL HIGH (ref 70–99)

## 2023-04-01 MED ORDER — LIDOCAINE 5 % EX PTCH: 1.0000 | MEDICATED_PATCH | CUTANEOUS | 0 refills | Status: DC

## 2023-04-01 MED ORDER — CETIRIZINE HCL 10 MG PO TABS: 10.0000 mg | ORAL_TABLET | Freq: Every day | ORAL | 3 refills | Status: DC

## 2023-04-01 NOTE — Patient Instructions (Addendum)
Thank you, Ms.Laniyah Arleane Spinale for allowing [place] to provide your care today. Today we discussed your chronic medical problems and right shoulder pain.  I have ordered the following labs for you:  Lab Orders         Glucose, capillary         POC Hbg A1C      Tests ordered today:  None  Referrals ordered today:   Referral Orders         Ambulatory referral to Gastroenterology       I have ordered the following medication/changed the following medications:   Stop the following medications: Medications Discontinued During This Encounter  Medication Reason   cetirizine (ZYRTEC) 10 MG tablet Reorder   benzonatate (TESSALON) 200 MG capsule Patient has not taken in last 30 days   fluticasone-salmeterol (ADVAIR DISKUS) 100-50 MCG/ACT AEPB Patient has not taken in last 30 days   Semaglutide,0.25 or 0.5MG /DOS, 2 MG/3ML SOPN Patient has not taken in last 30 days     Start the following medications: Meds ordered this encounter  Medications   cetirizine (ZYRTEC) 10 MG tablet    Sig: Take 1 tablet (10 mg total) by mouth daily.    Dispense:  90 tablet    Refill:  3   lidocaine (LIDODERM) 5 %    Sig: Place 1 patch onto the skin daily. Remove & Discard patch every 24 hours.    Dispense:  14 patch    Refill:  0     Follow up: 4 weeks as needed   Remember:   - Your refill has been sent to your preferred pharmacy. - We do not think you have a shoulder fracture, it is likely an irritation of the tendons in your shoulder. You can manage the pain with Tylenol 1000 mg up to 3 times a day combined with lidocaine patches. If the lidocaine patches from the pharmacy are too expensive you can get them over the counter and apply to the area that bothers you the most.  - If in 4 weeks there is no improvement in your pain, we can discuss a physical therapy referral and additional therapy.  - I have placed a referral for gastroenterology so you can establish with them again.   Should you have any  questions or concerns please call the internal medicine clinic at [phone].     Reniah Cottingham Colbert Coyer, MD PGY-1 Internal Medicine Teaching Progam [facility]

## 2023-04-01 NOTE — Telephone Encounter (Signed)
Decision:Approved  Kendra Adams (Key: [id number]) PA Case ID #: [id number] Rx #: [id number] Need Help? Call us at [phone] Outcome Approved today by [facility] [employer] [facility] 2017 Approved. Authorization Expiration Date: 03/31/2024 Drug Lidocaine 5% patches ePA cloud logo Form [employer] [place] [employer] Electronic Request Form Original Claim Info 75

## 2023-04-01 NOTE — Telephone Encounter (Signed)
Prior Authorization for patient (Lidocaine 5% patches) came through on cover my meds was submitted with last office notes awaiting approval or denial.  KEY:[id number]

## 2023-04-03 DIAGNOSIS — M25511 Pain in right shoulder: Secondary | ICD-10-CM | POA: Insufficient documentation

## 2023-04-03 NOTE — Assessment & Plan Note (Signed)
Patient reports she tripped and fell on her right shoulder about a week and a half ago. Feels like right shoulder is sore, not able to move it fully due to pain. Denies weakness or numbness. No obvious deformity or injury on physical exam. Right shoulder tender around rotator cuff area. Strength at baseline, sensation intact. Low concern for right shoulder fracture, discomfort likely due to an irritation of the tendons in the shoulder. Discussed managing pain conservatively, patient agreeable. Recommend Tylenol, lidocaine patches, and return to clinic in 4 weeks if symptoms worsen for further management, can consider PT referral at that time.  Plan - Tylenol 1000 mg up to three times a day as needed for pain - Lidocaine patch applied to area of greatest pain, can get OTC - Return to clinic in 4 weeks there is no improvement in pain, can discuss a physical therapy referral and additional pain management

## 2023-04-03 NOTE — Progress Notes (Signed)
Established Patient Office Visit  Subjective   Patient ID: Kendra Adams, female    DOB: [birth year]  Age: 63 y.o. MRN: [id number]  Chief Complaint  Patient presents with   Diabetes   Medication Refill   Shoulder Pain    Patient is a 63 y.o. with a past medical history stated below who presents today for follow-up for chronic medical conditions like ulcerative colitis, tobacco use disorder, HTN, T2DM, and shoulder pain. Medication refills provided. Patient last seen at [facility] on 11/12/2022. Patient previously referred to dermatology for forehead lesion concerning for skin cancer. Patient states appointment for later this spring but would like to know if sooner appointment is available. Will reach out to [employer]. Please see problem based assessment and plan for additional details.    Diabetes Pertinent negatives for diabetes include no chest pain.  Medication Refill Pertinent negatives include no abdominal pain, chest pain, nausea or vomiting.  Shoulder Pain    Past Medical History:  Diagnosis Date   Allergic rhinitis    Asthma    Atopic dermatitis    CAD (coronary artery disease)    a. 12/13/10: mRCA 99%, EF 65-70%;  s/p BMS to mRCA   COPD (chronic obstructive pulmonary disease) (HCC)    Coughing 03/17/2011   Depression    DM2 (diabetes mellitus, type 2) (HCC)    GERD (gastroesophageal reflux disease)    GI bleed    HTN (hypertension)    Hyperlipidemia    Hyperlipidemia    Hypothyroidism    Psoriasis    Seasonal allergies    Tobacco abuse    Review of Systems  Respiratory:  Negative for shortness of breath.   Cardiovascular:  Negative for chest pain and leg swelling.  Gastrointestinal:  Negative for abdominal pain, diarrhea, nausea and vomiting.  Genitourinary:  Negative for dysuria.  Musculoskeletal:        Right shoulder pain      Objective:     BP 129/84 (BP Location: Left Arm, Patient Position: Sitting, Cuff Size: Small)   Pulse 80   Temp 98.7 F  (37.1 C) (Oral)   Ht 5\' 2"  (1.575 m)   Wt 228 lb 12.8 oz (103.8 kg)   SpO2 100%   BMI 41.85 kg/m  BP Readings from Last 3 Encounters:  04/01/23 129/84  11/12/22 135/78  08/13/22 132/65   Wt Readings from Last 3 Encounters:  04/01/23 228 lb 12.8 oz (103.8 kg)  11/12/22 233 lb 6.4 oz (105.9 kg)  08/13/22 242 lb 3.2 oz (109.9 kg)   Physical Exam HENT:     Head: Normocephalic and atraumatic.     Nose: Nose normal.     Mouth/Throat:     Mouth: Mucous membranes are moist.  Cardiovascular:     Rate and Rhythm: Normal rate and regular rhythm.  Pulmonary:     Effort: Pulmonary effort is normal.  Abdominal:     General: Bowel sounds are normal.     Palpations: Abdomen is soft.  Musculoskeletal:     Comments: Right shoulder with tenderness around rotator cuff area with palpation. Strength at baseline bilaterally, sensation intact bilaterally. ROM minimally impacted by pain/discomfort, especially raising right arm above head.   Skin:    General: Skin is warm and dry.  Neurological:     Mental Status: She is alert. Mental status is at baseline.  Psychiatric:        Mood and Affect: Mood normal.        Behavior: Behavior  normal.    Results for orders placed or performed in visit on 04/01/23  Glucose, capillary  Result Value Ref Range   Glucose-Capillary 113 (H) 70 - 99 mg/dL  POC Hbg Z6X  Result Value Ref Range   Hemoglobin A1C 7.0 (A) 4.0 - 5.6 %   HbA1c POC (<> result, manual entry)     HbA1c, POC (prediabetic range)     HbA1c, POC (controlled diabetic range)      Last hemoglobin A1c Lab Results  Component Value Date   HGBA1C 7.0 (A) 04/01/2023    The 10-year ASCVD risk score (Arnett DK, et al., 2019) is: 24.2%    Assessment & Plan:   Problem List Items Addressed This Visit     Diabetes mellitus type 2 with complications (HCC) - Primary (Chronic)   Hgb A1c 7% today compared to 8% 4 months ago. Taking metformin 1000 mg BID, tolerating well. Was taking Ozempic  weekly injections but states it is too costly even with insurance, would rather go back to Victoza. Given improvement to A1c on metformin alone will continue, will reach out to pharmacy to help determine if there are any patient assistance programs.  Plan - Continue metformin 1000 mg BID  - Message Siri Cole about financial assistance for [employer], consider alternative if too cost prohibitive      Relevant Orders   POC Hbg A1C (Completed)   Ulcerative colitis (HCC) (Chronic)   Patient was following with Dr. Loreta Ave but has not been able to get in touch with clinic. Was on Humira previously, no current therapy. Denies acute flare or associated symptoms. Agreeable to GI referral to establish routine follow up.  Plan - GI referral today       Relevant Orders   Ambulatory referral to Gastroenterology   COPD (chronic obstructive pulmonary disease) (HCC)   Chronic. Patient continues to smoke about 2-3 cigarettes a day. Counseled on smoking cessation. Patient with chronic cough as well. States cough is much better with albuterol inhaler as needed. Physical exam with normal respiratory effort on room air. If there is worsening in symptoms consider maintenance inhaler. Continue to provide smoking cessation counseling.       Relevant Medications   cetirizine (ZYRTEC) 10 MG tablet   Right shoulder pain   Patient reports she tripped and fell on her right shoulder about a week and a half ago. Feels like right shoulder is sore, not able to move it fully due to pain. Denies weakness or numbness. No obvious deformity or injury on physical exam. Right shoulder tender around rotator cuff area. Strength at baseline, sensation intact. Low concern for right shoulder fracture, discomfort likely due to an irritation of the tendons in the shoulder. Discussed managing pain conservatively, patient agreeable. Recommend Tylenol, lidocaine patches, and return to clinic in 4 weeks if symptoms worsen for further  management, can consider PT referral at that time.  Plan - Tylenol 1000 mg up to three times a day as needed for pain - Lidocaine patch applied to area of greatest pain, can get OTC - Return to clinic in 4 weeks there is no improvement in pain, can discuss a physical therapy referral and additional pain management      Other Visit Diagnoses       Allergic sinusitis       Relevant Medications   cetirizine (ZYRTEC) 10 MG tablet      Patient seen with Dr. Sol Blazing.  Return in about 4 weeks (around 04/29/2023), or if symptoms  worsen or fail to improve, for As needed for shoulder pain.   Lempi Edwin Colbert Coyer, MD

## 2023-04-03 NOTE — Assessment & Plan Note (Addendum)
Patient was following with Dr. Loreta Ave but has not been able to get in touch with clinic. Was on Humira previously, no current therapy. Denies acute flare or associated symptoms. Agreeable to GI referral to establish routine follow up.  Plan - GI referral today

## 2023-04-03 NOTE — Assessment & Plan Note (Signed)
Hgb A1c 7% today compared to 8% 4 months ago. Taking metformin 1000 mg BID, tolerating well. Was taking Ozempic weekly injections but states it is too costly even with insurance, would rather go back to Victoza. Given improvement to A1c on metformin alone will continue, will reach out to pharmacy to help determine if there are any patient assistance programs.  Plan - Continue metformin 1000 mg BID  - Message Siri Cole about financial assistance for Ozempic, consider alternative if too cost prohibitive

## 2023-04-03 NOTE — Assessment & Plan Note (Addendum)
Chronic. Patient continues to smoke about 2-3 cigarettes a day. Counseled on smoking cessation. Patient with chronic cough as well. States cough is much better with albuterol inhaler as needed. Physical exam with normal respiratory effort on room air. If there is worsening in symptoms consider maintenance inhaler. Continue to provide smoking cessation counseling.

## 2023-04-06 NOTE — Progress Notes (Signed)
Internal Medicine Clinic Attending  I was physically present during the key portions of the resident provided service and participated in the medical decision making of patient's management care. I reviewed pertinent patient test results.  The assessment, diagnosis, and plan were formulated together and I agree with the documentation in the resident's note.  Full active and passive ROM without significant discomfort. Strength intact to resisted abduction, internal and external rotation, and forward flexion. Pain with resisted internal rotation. Suspect rotator cuff tendinopathy following injury. Continue pain management and regular movement in pain-free range. Consider PT referral if no improvement in the next 4-6 weeks.  Dickie La, MD

## 2023-04-07 ENCOUNTER — Other Ambulatory Visit: Payer: Self-pay | Admitting: Internal Medicine

## 2023-04-07 DIAGNOSIS — Z8669 Personal history of other diseases of the nervous system and sense organs: Secondary | ICD-10-CM

## 2023-04-07 NOTE — Telephone Encounter (Signed)
Patient is overdue for PCP follow up of chronic conditions. Unfortunately I do not think I have any availability. Please have her schedule with a new PCP. Thanks.

## 2023-04-23 ENCOUNTER — Other Ambulatory Visit: Payer: Self-pay | Admitting: Internal Medicine

## 2023-04-23 DIAGNOSIS — F17200 Nicotine dependence, unspecified, uncomplicated: Secondary | ICD-10-CM

## 2023-04-25 ENCOUNTER — Other Ambulatory Visit: Payer: Self-pay | Admitting: Internal Medicine

## 2023-04-25 DIAGNOSIS — E118 Type 2 diabetes mellitus with unspecified complications: Secondary | ICD-10-CM

## 2023-04-25 DIAGNOSIS — I2510 Atherosclerotic heart disease of native coronary artery without angina pectoris: Secondary | ICD-10-CM

## 2023-04-25 DIAGNOSIS — I10 Essential (primary) hypertension: Secondary | ICD-10-CM

## 2023-04-27 NOTE — Telephone Encounter (Signed)
 Patient is overdue for PCP follow up of chronic conditions. Unfortunately I do not think I have any availability. Please have her schedule with a new PCP. Thanks.

## 2023-04-28 ENCOUNTER — Encounter: Payer: Self-pay | Admitting: Internal Medicine

## 2023-04-28 NOTE — Telephone Encounter (Signed)
I called pt to schedule an appt - currently not at home; stated she will call back.

## 2023-05-06 ENCOUNTER — Other Ambulatory Visit: Payer: Self-pay | Admitting: Internal Medicine

## 2023-05-06 DIAGNOSIS — I2510 Atherosclerotic heart disease of native coronary artery without angina pectoris: Secondary | ICD-10-CM

## 2023-05-06 DIAGNOSIS — I152 Hypertension secondary to endocrine disorders: Secondary | ICD-10-CM

## 2023-05-06 DIAGNOSIS — E118 Type 2 diabetes mellitus with unspecified complications: Secondary | ICD-10-CM

## 2023-05-06 DIAGNOSIS — I10 Essential (primary) hypertension: Secondary | ICD-10-CM

## 2023-05-20 ENCOUNTER — Encounter: Payer: Self-pay | Admitting: Student

## 2023-05-20 ENCOUNTER — Telehealth: Payer: Self-pay

## 2023-05-20 ENCOUNTER — Ambulatory Visit (HOSPITAL_COMMUNITY)
Admission: RE | Admit: 2023-05-20 | Discharge: 2023-05-20 | Disposition: A | Discharge status: Home / Self Care | Source: Ambulatory Visit | Attending: Internal Medicine | Admitting: Internal Medicine

## 2023-05-20 ENCOUNTER — Ambulatory Visit (INDEPENDENT_AMBULATORY_CARE_PROVIDER_SITE_OTHER): Admitting: Student

## 2023-05-20 VITALS — BP 131/86 | HR 74 | Temp 99.0°F | Ht 62.0 in | Wt 234.0 lb

## 2023-05-20 DIAGNOSIS — M25551 Pain in right hip: Secondary | ICD-10-CM

## 2023-05-20 DIAGNOSIS — M25559 Pain in unspecified hip: Secondary | ICD-10-CM

## 2023-05-20 DIAGNOSIS — G8929 Other chronic pain: Secondary | ICD-10-CM | POA: Diagnosis not present

## 2023-05-20 MED ORDER — DICLOFENAC SODIUM 1 % EX GEL: 2.0000 g | Freq: Four times a day (QID) | CUTANEOUS | 5 refills | Status: AC

## 2023-05-20 MED ORDER — DICLOFENAC SODIUM 1 % EX GEL
2.0000 g | Freq: Four times a day (QID) | CUTANEOUS | 5 refills | Status: DC
Start: 2023-05-20 — End: 2023-05-20

## 2023-05-20 MED ORDER — LIDOCAINE 5 % EX PTCH: 1.0000 | MEDICATED_PATCH | CUTANEOUS | 0 refills | Status: DC

## 2023-05-20 MED ORDER — LIDOCAINE 5 % EX PTCH: 1.0000 | MEDICATED_PATCH | CUTANEOUS | 0 refills | Status: AC

## 2023-05-20 NOTE — Telephone Encounter (Signed)
 Prior Authorization for patient (Lidocaine 5% patches) came through on cover my meds was submitted with last office notes awaiting approval or denial.  [id number]

## 2023-05-20 NOTE — Progress Notes (Signed)
   CC: R hip pain  HPI:  [name] is a 63 y.o. female living with a history stated below and presents today for acute evaluation of hip pain. Please see problem based assessment and plan for additional details.  Past Medical History:  Diagnosis Date   Allergic rhinitis    Asthma    Atopic dermatitis    CAD (coronary artery disease)    a. 12/13/10: mRCA 99%, EF 65-70%;  s/p BMS to mRCA   COPD (chronic obstructive pulmonary disease) (HCC)    Coughing 03/17/2011   Depression    DM2 (diabetes mellitus, type 2) (HCC)    GERD (gastroesophageal reflux disease)    GI bleed    HTN (hypertension)    Hyperlipidemia    Hyperlipidemia    Hypothyroidism    Psoriasis    Seasonal allergies    Tobacco abuse     Review of Systems: ROS negative except for what is noted on the assessment and plan.  Vitals:   05/20/23 1005 05/20/23 1028  BP: 139/79 131/86  Pulse: 75 74  Temp: 99 F (37.2 C)   TempSrc: Oral   SpO2: 95%   Weight: 234 lb (106.1 kg)   Height: 5\' 2"  (1.575 m)     Physical Exam: Constitutional: well-appearing woman sitting in chair in no acute distress Cardiovascular: regular rate and rhythm, no m/r/g Pulmonary/Chest: normal work of breathing on room air, lungs clear to auscultation bilaterally MSK: normal bulk and tone. Full ROM of the R hip, negative Faber/Fadir, point tenderness at greater trochanter, no instability, she can bear weight and walk unassisted. Skin: warm and dry Psych: normal mood and behavior   Assessment & Plan:   Patient discussed with Dr. Mayford Knife  Greater trochanteric pain syndrome She has 3 months of acute on chronic right hip pain.  She reports low-level pain for 3 decades since she had a fall.  There are no new injuries or falls now, instead pain worsened without clear etiology.  The pain gets worse throughout the day, although is not necessarily associated with activity.  Range of motion, FADIR etc. testing is nonrevealing.  She  does have point tenderness to the greater trochanter.  Given that, do suspect trochanteric pain/bursitis is the most likely issue.  As she has never had imaging, will collect an x-ray however, I doubt there is any fracture but possible arthritis. - Right hip x-ray - Refer for physical therapy - Lidocaine and Voltaren as needed - Refer for physical therapy - If she fails conservative treatment, could pursue injections  Katheran James, D.O. [facility] Internal Medicine, PGY-1 Phone: [phone] Date 05/20/2023 Time 11:57 AM

## 2023-05-20 NOTE — Assessment & Plan Note (Addendum)
 She has 3 months of acute on chronic right hip pain.  She reports low-level pain for 3 decades since she had a fall.  There are no new injuries or falls now, instead pain worsened without clear etiology.  The pain gets worse throughout the day, although is not necessarily associated with activity.  Range of motion, FADIR etc. testing is nonrevealing.  She does have point tenderness to the greater trochanter.  Given that, do suspect trochanteric pain/bursitis is the most likely issue.  As she has never had imaging, will collect an x-ray however, I doubt there is any fracture but possible arthritis. - Right hip x-ray - Refer for physical therapy - Lidocaine and Voltaren as needed - Refer for physical therapy - If she fails conservative treatment, could pursue injections

## 2023-05-20 NOTE — Patient Instructions (Signed)
 Take up to 4000mg  of tylenol/day. Use topical relief as needed. I will call about your XR results. Expect a call for physical therapy. We can consider injections in the future.

## 2023-05-21 NOTE — Telephone Encounter (Signed)
 Malijah Lietz (Key: [id number]) PA Case ID #: [id number] Need Help? Call us at [phone] Outcome Approved on March 12 by [employer] HIX 2017 Approved. LIDOCAINE 5% Patch is approved from 05/20/2023 to 11/20/2023. Effective Date: 05/20/2023 Authorization Expiration Date: 11/20/2023 Drug Lidocaine 5% patches ePA cloud logo Form [employer] / Chief of Staff Prior Authorization Form

## 2023-05-24 NOTE — Progress Notes (Signed)
 Internal Medicine Clinic Attending  Case discussed with the resident at the time of the visit.  We reviewed the resident's history and exam and pertinent patient test results.  I agree with the assessment, diagnosis, and plan of care documented in the resident's note.  Symptoms and examination most c/w greater trochanteric pain syndrome.

## 2023-06-03 ENCOUNTER — Other Ambulatory Visit: Payer: Self-pay | Admitting: Internal Medicine

## 2023-06-04 NOTE — Telephone Encounter (Signed)
 Medication sent to pharmacy

## 2023-06-12 ENCOUNTER — Other Ambulatory Visit: Payer: Self-pay | Admitting: Internal Medicine

## 2023-06-12 DIAGNOSIS — Z8669 Personal history of other diseases of the nervous system and sense organs: Secondary | ICD-10-CM

## 2023-06-18 ENCOUNTER — Other Ambulatory Visit: Payer: Self-pay | Admitting: Internal Medicine

## 2023-06-18 DIAGNOSIS — E1159 Type 2 diabetes mellitus with other circulatory complications: Secondary | ICD-10-CM

## 2023-06-23 ENCOUNTER — Encounter (HOSPITAL_COMMUNITY): Payer: Self-pay | Admitting: Emergency Medicine

## 2023-06-23 ENCOUNTER — Other Ambulatory Visit: Payer: Self-pay

## 2023-06-23 ENCOUNTER — Ambulatory Visit (HOSPITAL_COMMUNITY)
Admission: EM | Admit: 2023-06-23 | Discharge: 2023-06-23 | Disposition: A | Discharge status: Home / Self Care | Attending: Physician Assistant | Admitting: Physician Assistant

## 2023-06-23 DIAGNOSIS — J4521 Mild intermittent asthma with (acute) exacerbation: Secondary | ICD-10-CM

## 2023-06-23 DIAGNOSIS — R051 Acute cough: Secondary | ICD-10-CM | POA: Diagnosis not present

## 2023-06-23 MED ORDER — BENZONATATE 100 MG PO CAPS: 100.0000 mg | ORAL_CAPSULE | Freq: Three times a day (TID) | ORAL | 0 refills | Status: DC

## 2023-06-23 MED ORDER — DOXYCYCLINE HYCLATE 100 MG PO CAPS: 100.0000 mg | ORAL_CAPSULE | Freq: Two times a day (BID) | ORAL | 0 refills | Status: AC

## 2023-06-23 MED ORDER — PREDNISONE 10 MG PO TABS: 20.0000 mg | ORAL_TABLET | Freq: Every day | ORAL | 0 refills | Status: DC

## 2023-06-23 NOTE — ED Triage Notes (Signed)
 Persistent cough and nasal congestion for a week. No getting better with OTC medication

## 2023-06-23 NOTE — Discharge Instructions (Addendum)
 Take antibiotic as prescribed. Take prednisone as prescribed. Can take Tessalon as needed for cough. Drink plenty of fluids. Continue with albuterol inhaler as needed. If symptoms become worse or no improvement please return.

## 2023-06-23 NOTE — ED Provider Notes (Signed)
 [facility]    CSN: [id number] Arrival date & time: 06/23/23  1555      History   Chief Complaint Chief Complaint  Patient presents with   Cough    HPI Kendra Adams is a 63 y.o. female.   Patient presents for 1 week of cough, congestion.  Patient reports history of asthma and has been using her inhaler with some improvement.  Reports intermittent wheezing but denies shortness of breath.  She denies fever, chills, body aches.  Has been taking OTC medications with minimal improvement.    Past Medical History:  Diagnosis Date   Allergic rhinitis    Asthma    Atopic dermatitis    CAD (coronary artery disease)    a. 12/13/10: mRCA 99%, EF 65-70%;  s/p BMS to mRCA   COPD (chronic obstructive pulmonary disease) (HCC)    Coughing 03/17/2011   Depression    DM2 (diabetes mellitus, type 2) (HCC)    GERD (gastroesophageal reflux disease)    GI bleed    HTN (hypertension)    Hyperlipidemia    Hyperlipidemia    Hypothyroidism    Psoriasis    Seasonal allergies    Tobacco abuse     Patient Active Problem List   Diagnosis Date Noted   Greater trochanteric pain syndrome 05/20/2023   Right shoulder pain 04/03/2023   Scalp lesion 08/13/2022   Tinea cruris 08/13/2022   Cough 04/18/2022   Erythema nodosum 01/15/2022   Routine screening for STI (sexually transmitted infection) 12/25/2021   Condylomata acuminata in female 12/25/2021   Esophageal dysphagia 12/25/2021   Ear foreign body 09/18/2021   COPD (chronic obstructive pulmonary disease) (HCC)    Iron deficiency 01/02/2021   Encounter for screening mammogram for breast cancer 01/02/2021   Osteoarthritis of left knee    Hypocalcemia 12/02/2018   Ulcerative colitis (HCC) 09/22/2018   Dyspareunia, female 04/06/2017   History of restless legs syndrome 11/24/2016   Psoriasis 10/15/2015   Tobacco use disorder 09/07/2014   Morbid obesity with BMI of 40.0-44.9, adult (HCC) 03/19/2011   Routine health  maintenance 02/03/2011   Diabetes mellitus type 2 with complications (HCC) 03/10/2010   Hypertension associated with diabetes (HCC) 05/10/2008   Hypothyroidism 04/27/2008   HLD (hyperlipidemia) 04/27/2008   Depression 04/27/2008   GERD 04/27/2008    Past Surgical History:  Procedure Laterality Date   BIOPSY  10/28/2018   Procedure: BIOPSY;  Surgeon: Tami Falcon, MD;  Location: [facility];  Service: Endoscopy;;   CARDIAC CATHETERIZATION N/A 08/14/2015   Procedure: Left Heart Cath and Coronary Angiography;  Surgeon: Peter M Swaziland, MD;  Location: [facility] INVASIVE CV LAB;  Service: Cardiovascular;  Laterality: N/A;   CARPAL TUNNEL RELEASE     w/ bone spurs   CHOLECYSTECTOMY N/A 08/15/2015   Procedure: LAPAROSCOPIC CHOLECYSTECTOMY WITH ATTEMPTED INTRAOPERATIVE CHOLANGIOGRAM;  Surgeon: Sim Dryer, MD;  Location: [facility] OR;  Service: General;  Laterality: N/A;   COLONOSCOPY WITH PROPOFOL N/A 10/28/2018   Procedure: COLONOSCOPY WITH PROPOFOL;  Surgeon: Tami Falcon, MD;  Location: [facility];  Service: Endoscopy;  Laterality: N/A;   CORONARY ANGIOPLASTY WITH STENT PLACEMENT     DILATION AND CURETTAGE OF UTERUS     LEFT HEART CATH AND CORONARY ANGIOGRAPHY N/A 11/23/2019   Procedure: LEFT HEART CATH AND CORONARY ANGIOGRAPHY;  Surgeon: Odie Benne, MD;  Location: [facility] INVASIVE CV LAB;  Service: Cardiovascular;  Laterality: N/A;    OB History     Gravida  2   Para  Term      Preterm      AB  2   Living         SAB  1   IAB  1   Ectopic      Multiple      Live Births               Home Medications    Prior to Admission medications   Medication Sig Start Date End Date Taking? Authorizing Provider  benzonatate (TESSALON) 100 MG capsule Take 1 capsule (100 mg total) by mouth every 8 (eight) hours. 06/23/23  Yes Ward, Char Common, PA-C  doxycycline (VIBRAMYCIN) 100 MG capsule Take 1 capsule (100 mg total) by mouth 2 (two) times daily for 7 days. 06/23/23 06/30/23 Yes  Ward, Char Common, PA-C  predniSONE (DELTASONE) 10 MG tablet Take 2 tablets (20 mg total) by mouth daily. 06/23/23  Yes Ward, Char Common, PA-C  Accu-Chek Softclix Lancets lancets USE   TO CHECK GLUCOSE ONCE DAILY 10/03/22   Guilloud, Carolyn, MD  acetaminophen (TYLENOL) 325 MG tablet Take 2 tablets (650 mg total) by mouth every 6 (six) hours as needed for mild pain (or Fever >/= 101). 06/14/20   Marya Smack, MD  albuterol (VENTOLIN HFA) 108 (90 Base) MCG/ACT inhaler INHALE 1 TO 2 PUFFS BY MOUTH EVERY 6 HOURS AS NEEDED FOR WHEEZING AND FOR SHORTNESS OF BREATH 06/04/23   Guilloud, Carolyn, MD  aspirin EC 81 MG tablet Take 1 tablet (81 mg total) by mouth daily. Swallow whole. 05/23/21   Sonny Dust, MD  atorvastatin (LIPITOR) 80 MG tablet Take 1 tablet (80 mg total) by mouth daily. 08/13/22   Guilloud, Carolyn, MD  betamethasone dipropionate (DIPROLENE) 0.05 % cream Apply topically 2 (two) times daily. 02/15/18   Guilloud, Carolyn, MD  buPROPion (WELLBUTRIN SR) 150 MG 12 hr tablet Take 1 tablet by mouth twice daily 04/23/23   Guilloud, Carolyn, MD  cetirizine (ZYRTEC) 10 MG tablet Take 1 tablet (10 mg total) by mouth daily. 04/01/23   Arellano Zameza, Priscila, MD  Clotrimazole 1 % OINT Apply to affected area twice daily 08/13/22   Guilloud, Carolyn, MD  diclofenac Sodium (VOLTAREN) 1 % GEL Apply 2 g topically 4 (four) times daily. 05/20/23   Carleen Chary, DO  ferrous sulfate 325 (65 FE) MG EC tablet Take 1 tablet by mouth once daily with breakfast 03/06/23   Guilloud, Carolyn, MD  fluconazole (DIFLUCAN) 150 MG tablet Take 1 tablet (150 mg total) by mouth every 3 (three) days as needed. 01/15/22   Guilloud, Carolyn, MD  fluticasone (FLONASE) 50 MCG/ACT nasal spray Place 2 sprays into both nostrils daily. 09/26/19   Bloomfield, Carley D, DO  glucose blood (ACCU-CHEK GUIDE) test strip Use as instructed 10/03/22   Lasandra Points, MD  Insulin Pen Needle 32G X 4 MM MISC Use daily as diretced 01/02/21    Guilloud, Carolyn, MD  isosorbide mononitrate (IMDUR) 30 MG 24 hr tablet Take 0.5 tablets (15 mg total) by mouth daily. 08/13/22   Guilloud, Carolyn, MD  levothyroxine (SYNTHROID) 150 MCG tablet TAKE 1 TABLET BY MOUTH BEFORE BREAKFAST 08/13/22   Lasandra Points, MD  lidocaine (LIDODERM) 5 % Place 1 patch onto the skin daily. Remove & Discard patch every 24 hours. 05/20/23   Carleen Chary, DO  losartan (COZAAR) 100 MG tablet TAKE 1 TABLET BY MOUTH AT BEDTIME 06/18/23   Guilloud, Carolyn, MD  meclizine (ANTIVERT) 25 MG tablet Take 25 mg by mouth as needed for  dizziness.    [provider]  metFORMIN (GLUCOPHAGE) 1000 MG tablet Take 1 tablet by mouth twice daily 09/29/22   Reymundo Poll, MD  metoprolol succinate (TOPROL-XL) 100 MG 24 hr tablet TAKE 1 TABLET BY MOUTH ONCE DAILY WITH MEALS OR  IMMDEDIATELY  AFTER 04/27/23   Reymundo Poll, MD  nitroGLYCERIN (NITROSTAT) 0.4 MG SL tablet DISSOLVE ONE TABLET UNDER THE TONGUE EVERY 5 MINUTES AS NEEDED FOR CHEST PAIN.  DO NOT EXCEED A TOTAL OF 3 DOSES IN 15 MINUTES. CALL 911 01/30/23   Swinyer, Zachary George, NP  omeprazole (PRILOSEC) 40 MG capsule Take 1 capsule (40 mg total) by mouth daily. 11/12/22   Reymundo Poll, MD  ondansetron (ZOFRAN) 4 MG tablet Take 1 tablet (4 mg total) by mouth every 8 (eight) hours as needed for nausea or vomiting. 11/10/19   Darr, Gerilyn Pilgrim, PA-C  rOPINIRole (REQUIP) 2 MG tablet TAKE 1 & 1/2 (ONE & ONE-HALF) TABLETS BY MOUTH ONCE DAILY AT BEDTIME 06/15/23   Angelita Ingles, MD  sertraline (ZOLOFT) 100 MG tablet Take 1 and 1/2 tablets by mouth once daily 08/13/22   Reymundo Poll, MD  Spacer/Aero-Holding Chambers (AEROCHAMBER PLUS) inhaler Use as instructed 02/03/15   Domenick Gong, MD    Family History Family History  Problem Relation Age of Onset   Diabetes Mother    Hypertension Mother    Breast cancer Neg Hx     Social History Social History   Tobacco Use   Smoking status: Every Day    Current  packs/day: 0.00    Average packs/day: 0.3 packs/day for 40.0 years (12.0 ttl pk-yrs)    Types: Cigarettes    Start date: 05/19/1974    Last attempt to quit: 05/19/2014    Years since quitting: 9.1    Passive exposure: Current   Smokeless tobacco: Never   Tobacco comments:    5 cigs/day.  Vaping Use   Vaping status: Some Days  Substance Use Topics   Alcohol use: No    Alcohol/week: 0.0 standard drinks of alcohol   Drug use: Yes    Frequency: 7.0 times per week    Types: Marijuana    Comment: Marijuana Use     Allergies   Nsaids, Opium, Tramadol hcl, and Gabapentin   Review of Systems Review of Systems  Constitutional:  Negative for chills and fever.  HENT:  Positive for congestion. Negative for ear pain and sore throat.   Eyes:  Negative for pain and visual disturbance.  Respiratory:  Positive for cough and wheezing. Negative for shortness of breath.   Cardiovascular:  Negative for chest pain and palpitations.  Gastrointestinal:  Negative for abdominal pain and vomiting.  Genitourinary:  Negative for dysuria and hematuria.  Musculoskeletal:  Negative for arthralgias and back pain.  Skin:  Negative for color change and rash.  Neurological:  Negative for seizures and syncope.  All other systems reviewed and are negative.    Physical Exam Triage Vital Signs ED Triage Vitals [06/23/23 1651]  Encounter Vitals Group     BP 126/75     Systolic BP Percentile      Diastolic BP Percentile      Pulse Rate 100     Resp 20     Temp 98.5 F (36.9 C)     Temp Source Oral     SpO2 90 %     Weight      Height      Head Circumference      Peak  Flow      Pain Score 0     Pain Loc      Pain Education      Exclude from Growth Chart    No data found.  Updated Vital Signs BP 126/75 (BP Location: Right Arm)   Pulse 100   Temp 98.5 F (36.9 C) (Oral)   Resp 20   SpO2 90%   Visual Acuity Right Eye Distance:   Left Eye Distance:   Bilateral Distance:    Right Eye  Near:   Left Eye Near:    Bilateral Near:     Physical Exam Vitals and nursing note reviewed.  Constitutional:      General: She is not in acute distress.    Appearance: She is well-developed.  HENT:     Head: Normocephalic and atraumatic.  Eyes:     Conjunctiva/sclera: Conjunctivae normal.  Cardiovascular:     Rate and Rhythm: Normal rate and regular rhythm.     Heart sounds: No murmur heard. Pulmonary:     Effort: Pulmonary effort is normal. No respiratory distress.     Breath sounds: Normal breath sounds.  Abdominal:     Palpations: Abdomen is soft.     Tenderness: There is no abdominal tenderness.  Musculoskeletal:        General: No swelling.     Cervical back: Neck supple.  Skin:    General: Skin is warm and dry.     Capillary Refill: Capillary refill takes less than 2 seconds.  Neurological:     Mental Status: She is alert.  Psychiatric:        Mood and Affect: Mood normal.      UC Treatments / Results  Labs (all labs ordered are listed, but only abnormal results are displayed) Labs Reviewed - No data to display  EKG   Radiology No results found.  Procedures Procedures (including critical care time)  Medications Ordered in UC Medications - No data to display  Initial Impression / Assessment and Plan / UC Course  I have reviewed the triage vital signs and the nursing notes.  Pertinent labs & imaging results that were available during my care of the patient were reviewed by me and considered in my medical decision making (see chart for details).     Will treat for acute asthma exacerbation provoked by viral upper respiratory infection.  O2 sats 95 during exam.  Lungs clear to auscultation.  Antibiotic prescribed, prednisone prescribed, Tessalon prescribed.  Patient well-appearing in no acute distress.  Return precautions discussed. Final Clinical Impressions(s) / UC Diagnoses   Final diagnoses:  Mild intermittent asthma with acute exacerbation   Acute cough     Discharge Instructions      Take antibiotic as prescribed. Take prednisone as prescribed. Can take Tessalon as needed for cough. Drink plenty of fluids. Continue with albuterol inhaler as needed. If symptoms become worse or no improvement please return.   ED Prescriptions     Medication Sig Dispense Auth. Provider   benzonatate (TESSALON) 100 MG capsule Take 1 capsule (100 mg total) by mouth every 8 (eight) hours. 21 capsule Ward, Eran Windish Z, PA-C   predniSONE (DELTASONE) 10 MG tablet Take 2 tablets (20 mg total) by mouth daily. 15 tablet Ward, Alayha Babineaux Z, PA-C   doxycycline (VIBRAMYCIN) 100 MG capsule Take 1 capsule (100 mg total) by mouth 2 (two) times daily for 7 days. 14 capsule Ward, Tiyona Desouza Z, PA-C      PDMP not reviewed this  encounter.   Ward, Char Common, PA-C 06/23/23 1734

## 2023-07-12 ENCOUNTER — Encounter: Payer: Self-pay | Admitting: Student

## 2023-07-13 ENCOUNTER — Other Ambulatory Visit (HOSPITAL_COMMUNITY)
Admission: RE | Admit: 2023-07-13 | Discharge: 2023-07-13 | Disposition: A | Discharge status: Home / Self Care | Source: Ambulatory Visit | Attending: Internal Medicine | Admitting: Internal Medicine

## 2023-07-13 ENCOUNTER — Ambulatory Visit (INDEPENDENT_AMBULATORY_CARE_PROVIDER_SITE_OTHER): Admitting: Student

## 2023-07-13 VITALS — BP 128/68 | HR 76 | Temp 98.4°F | Ht 62.0 in | Wt 235.3 lb

## 2023-07-13 DIAGNOSIS — E039 Hypothyroidism, unspecified: Secondary | ICD-10-CM

## 2023-07-13 DIAGNOSIS — Z8742 Personal history of other diseases of the female genital tract: Secondary | ICD-10-CM

## 2023-07-13 DIAGNOSIS — E118 Type 2 diabetes mellitus with unspecified complications: Secondary | ICD-10-CM

## 2023-07-13 DIAGNOSIS — N898 Other specified noninflammatory disorders of vagina: Secondary | ICD-10-CM

## 2023-07-13 DIAGNOSIS — E1159 Type 2 diabetes mellitus with other circulatory complications: Secondary | ICD-10-CM | POA: Diagnosis not present

## 2023-07-13 DIAGNOSIS — E038 Other specified hypothyroidism: Secondary | ICD-10-CM

## 2023-07-13 DIAGNOSIS — Z7984 Long term (current) use of oral hypoglycemic drugs: Secondary | ICD-10-CM

## 2023-07-13 DIAGNOSIS — R300 Dysuria: Secondary | ICD-10-CM

## 2023-07-13 DIAGNOSIS — Z7985 Long-term (current) use of injectable non-insulin antidiabetic drugs: Secondary | ICD-10-CM

## 2023-07-13 DIAGNOSIS — I152 Hypertension secondary to endocrine disorders: Secondary | ICD-10-CM | POA: Diagnosis not present

## 2023-07-13 DIAGNOSIS — F32A Depression, unspecified: Secondary | ICD-10-CM

## 2023-07-13 DIAGNOSIS — Z113 Encounter for screening for infections with a predominantly sexual mode of transmission: Secondary | ICD-10-CM

## 2023-07-13 DIAGNOSIS — Z124 Encounter for screening for malignant neoplasm of cervix: Secondary | ICD-10-CM | POA: Insufficient documentation

## 2023-07-13 DIAGNOSIS — L409 Psoriasis, unspecified: Secondary | ICD-10-CM

## 2023-07-13 LAB — POCT GLYCOSYLATED HEMOGLOBIN (HGB A1C): Hemoglobin A1C: 8.4 % — AB (ref 4.0–5.6)

## 2023-07-13 LAB — GLUCOSE, CAPILLARY: Glucose-Capillary: 233 mg/dL — ABNORMAL HIGH (ref 70–99)

## 2023-07-13 MED ORDER — SERTRALINE HCL 100 MG PO TABS: ORAL_TABLET | ORAL | 3 refills | Status: AC

## 2023-07-13 MED ORDER — METFORMIN HCL 1000 MG PO TABS: 1000.0000 mg | ORAL_TABLET | Freq: Two times a day (BID) | ORAL | 3 refills | Status: AC

## 2023-07-13 MED ORDER — LOSARTAN POTASSIUM 100 MG PO TABS: 50.0000 mg | ORAL_TABLET | Freq: Every day | ORAL | 3 refills | Status: AC

## 2023-07-13 NOTE — Assessment & Plan Note (Signed)
 See cervical cancer screening

## 2023-07-13 NOTE — Assessment & Plan Note (Addendum)
 She has a history of psoriasis and recently saw dermatology.  She was given triamcinolone  times a day.  This had about 80% of her body psoriasis.  Recently saw dermatology and was given ointment.  She has been using this but she has been more itchy.  I do not see any scale at this time or signs of tinea cruris.  She may benefit from following up with dermatology earlier.  For now she will continue on triamcinolone  twice daily but may benefit from an immunomodulators given her extensive disease.  -continue to follow up with Dermatology

## 2023-07-13 NOTE — Assessment & Plan Note (Addendum)
 Hemoglobin A1c is currently increased from 7 to 8.4.  The patient used to take Victoza  but continue with the send January.  She is taking 100 mg twice daily of metformin .  Seems like at some point she was prescribed Ozempic  but she denies ever using this medication.  She was approved for this medication in February and may benefit from started giving her BMI of 43.  I will restart this medication.   -Continue with metformin  1000 mg twice daily -Start Ozempic  -cw statin

## 2023-07-13 NOTE — Patient Instructions (Signed)
 Thank you, Ms.Jakyrah Arli Penington for allowing us  to provide your care today.   I have ordered the following labs for you:  Lab Orders         Glucose, capillary         HIV antibody (with reflex)         POC Hbg A1C       We also did a pap smear  I have ordered the following medication/changed the following medications:   Start the following medications: Meds ordered this encounter  Medications   losartan  (COZAAR ) 100 MG tablet    Sig: Take 0.5 tablets (50 mg total) by mouth at bedtime.    Dispense:  90 tablet    Refill:  3   metFORMIN  (GLUCOPHAGE ) 1000 MG tablet    Sig: Take 1 tablet (1,000 mg total) by mouth 2 (two) times daily.    Dispense:  180 tablet    Refill:  3   sertraline  (ZOLOFT ) 100 MG tablet    Sig: Take 1 and 1/2 tablets by mouth once daily    Dispense:  135 tablet    Refill:  3     Follow up: 3 months or sooner if needed   Should you have any questions or concerns please call the internal medicine clinic at [phone].     Jose Ngo, MD [facility]

## 2023-07-13 NOTE — Assessment & Plan Note (Addendum)
 Screening for cervical cancer and STIs today as she states that she has been having vaginal itch and bleeding with sexual intercourse.  She has a history of abnormal Pap in 2019. Had a repeat pap in 2022 and this was negative. However now she is experiencing post-coital bleeding and itch. Has remote history of chlamydia in her 30s. Grape-like cluser in vaginal canal noted in 2019. Never has had children. Sexually active but does not use barrier contraception. Denies having any ulcers although sometimes she feels she does have perineal stinging. On exam there were no ulcers, growths or skin lesions. She does have fusion of the labia and vaginal atrophy which may be contributing to her symptoms. Has not tried lube yet but bought it.   -Pap smear today  -GC, BV, and trichomonas testing today  -HIV RPR -May benefit from endometrial ultrasound if bleeding continues

## 2023-07-13 NOTE — Assessment & Plan Note (Signed)
 TSH 8 months ago was within normal.  Patient is on levothyroxine  150 mcg.  She should continue on this.

## 2023-07-13 NOTE — Progress Notes (Unsigned)
 CC:  Chief Complaint  Patient presents with   Vaginitis    Pt c/o "yeast infection" recent course of doxycycline      HPI:  [name] is a 63 y.o. female living with a history stated below and presents today for the above. Please see problem based assessment and plan for additional details.  Past Medical History:  Diagnosis Date   Allergic rhinitis    Asthma    Atopic dermatitis    CAD (coronary artery disease)    a. 12/13/10: mRCA 99%, EF 65-70%;  s/p BMS to mRCA   COPD (chronic obstructive pulmonary disease) (HCC)    Coughing 03/17/2011   Depression    DM2 (diabetes mellitus, type 2) (HCC)    GERD (gastroesophageal reflux disease)    GI bleed    HTN (hypertension)    Hyperlipidemia    Hyperlipidemia    Hypothyroidism    Psoriasis    Seasonal allergies    Tobacco abuse     Current Outpatient Medications on File Prior to Visit  Medication Sig Dispense Refill   triamcinolone  ointment (KENALOG ) 0.1 % Apply 1 Application topically 2 (two) times daily.     acetaminophen  (TYLENOL ) 325 MG tablet Take 2 tablets (650 mg total) by mouth every 6 (six) hours as needed for mild pain (or Fever >/= 101). 30 tablet 0   albuterol  (VENTOLIN  HFA) 108 (90 Base) MCG/ACT inhaler INHALE 1 TO 2 PUFFS BY MOUTH EVERY 6 HOURS AS NEEDED FOR WHEEZING AND FOR SHORTNESS OF BREATH 18 g 0   aspirin  EC 81 MG tablet Take 1 tablet (81 mg total) by mouth daily. Swallow whole. 90 tablet 3   atorvastatin  (LIPITOR ) 80 MG tablet Take 1 tablet (80 mg total) by mouth daily. 90 tablet 3   betamethasone  dipropionate (DIPROLENE ) 0.05 % cream Apply topically 2 (two) times daily. 45 g 2   buPROPion  (WELLBUTRIN  SR) 150 MG 12 hr tablet Take 1 tablet by mouth twice daily 180 tablet 0   cetirizine  (ZYRTEC ) 10 MG tablet Take 1 tablet (10 mg total) by mouth daily. 90 tablet 3   diclofenac  Sodium (VOLTAREN ) 1 % GEL Apply 2 g topically 4 (four) times daily. 100 g 5   ferrous sulfate  325 (65 FE) MG EC tablet Take 1  tablet by mouth once daily with breakfast 90 tablet 0   fluticasone  (FLONASE ) 50 MCG/ACT nasal spray Place 2 sprays into both nostrils daily. 16 g 2   glucose blood (ACCU-CHEK GUIDE) test strip Use as instructed 50 each 0   Insulin  Pen Needle 32G X 4 MM MISC Use daily as diretced 100 each 1   isosorbide  mononitrate (IMDUR ) 30 MG 24 hr tablet Take 0.5 tablets (15 mg total) by mouth daily. 45 tablet 3   levothyroxine  (SYNTHROID ) 150 MCG tablet TAKE 1 TABLET BY MOUTH BEFORE BREAKFAST 90 tablet 3   lidocaine  (LIDODERM ) 5 % Place 1 patch onto the skin daily. Remove & Discard patch every 24 hours. 14 patch 0   meclizine  (ANTIVERT ) 25 MG tablet Take 25 mg by mouth as needed for dizziness.     metoprolol  succinate (TOPROL -XL) 100 MG 24 hr tablet TAKE 1 TABLET BY MOUTH ONCE DAILY WITH MEALS OR  IMMDEDIATELY  AFTER 90 tablet 0   nitroGLYCERIN  (NITROSTAT ) 0.4 MG SL tablet DISSOLVE ONE TABLET UNDER THE TONGUE EVERY 5 MINUTES AS NEEDED FOR CHEST PAIN.  DO NOT EXCEED A TOTAL OF 3 DOSES IN 15 MINUTES. CALL 911 25 tablet 0  omeprazole  (PRILOSEC) 40 MG capsule Take 1 capsule (40 mg total) by mouth daily. 90 capsule 3   ondansetron  (ZOFRAN ) 4 MG tablet Take 1 tablet (4 mg total) by mouth every 8 (eight) hours as needed for nausea or vomiting. 4 tablet 0   rOPINIRole  (REQUIP ) 2 MG tablet TAKE 1 & 1/2 (ONE & ONE-HALF) TABLETS BY MOUTH ONCE DAILY AT BEDTIME 90 tablet 0   Spacer/Aero-Holding Chambers (AEROCHAMBER PLUS) inhaler Use as instructed 1 each 2   No current facility-administered medications on file prior to visit.    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  Vaping Use   Vaping status: Some Days  Substance and Sexual Activity   Alcohol use: No    Alcohol/week: 0.0 standard drinks of alcohol   Drug use: Yes    Frequency: 7.0 times per week    Types: Marijuana    Comment: Marijuana Use   Sexual activity: Not on file  Other Topics Concern   Not on file  Social History Narrative   Father died of suicide (gunshot) in 64. 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       Patient lives with her husband, her husband's niece Tammy who is mentally retarded and her best friend and the friend's daughter and 6 dogs               Social Drivers of Corporate investment banker Strain: High Risk (01/15/2022)   Overall Financial Resource Strain (CARDIA)    Difficulty of Paying Living Expenses: Very hard  Food Insecurity: No Food Insecurity (01/15/2022)   Hunger Vital Sign    Worried About Running Out of Food in the Last Year: Never true    Ran Out of Food in the Last Year: Never true  Transportation Needs: No Transportation Needs (01/15/2022)   PRAPARE - Administrator, Civil Service (Medical): No    Lack of Transportation (Non-Medical): No  Physical Activity: Insufficiently Active (01/15/2022)   Exercise Vital Sign    Days of Exercise per Week: 3 days    Minutes of Exercise per Session: 40 min  Stress: No Stress Concern Present (01/15/2022)   [employer] of Occupational Health - Occupational Stress Questionnaire    Feeling of Stress : Only a little   Social Connections: Socially Isolated (01/15/2022)   Social Connection and Isolation Panel [NHANES]    Frequency of Communication with Friends and Family: Once a week  Frequency of Social Gatherings with Friends and Family: Once a week    Attends Religious Services: Never    Database administrator or Organizations: No    Attends Banker Meetings: Never    Marital Status: Married  Catering manager Violence: Not At Risk (01/15/2022)   Humiliation, Afraid, Rape, and Kick questionnaire    Fear of Current or Ex-Partner: No    Emotionally Abused: No    Physically Abused: No    Sexually Abused: No    Review of Systems: ROS negative except for what is noted on the assessment and plan.  Vitals:   07/13/23 0955  BP: 128/68  Pulse: 76  Temp: 98.4 F (36.9 C)  TempSrc: Oral  SpO2: 97%  Weight: 235 lb 4.8 oz (106.7 kg)  Height: 5\' 2"  (1.575 m)    Physical Exam: Constitutional: well-appearing, in no acute distress HENT: normocephalic atraumatic, mucous membranes moist Eyes: conjunctiva non-erythematous Cardiovascular: regular rate and rhythm, no m/r/g Pulmonary/Chest: normal work of breathing on room air, lungs clear to auscultation bilaterally Abdominal: soft, non-tender, non-distended MSK: normal bulk and tone Neurological: alert & oriented x 3, no focal deficit Skin: warm and dry, multiple erythematous plaques and patches over the arms and legs as well as central torso.  Genital: Exam done with chaperone in room Aden Agreste).  Fused erythematous labia no lichenification or thinning of the skin noted. Vaginal dryness present requiring more lube to get the speculum into the cervical canal, extensive white-greenish discharge along with white plaques along the cervical tract.  Friability present. Psych: normal mood and behavior  Assessment & Plan:   Patient seen with Dr. Broadus Canes  Diabetes mellitus type 2 with complications (HCC) Hemoglobin A1c is currently increased from 7  to 8.4.  The patient used to take Victoza  but continue with the send January.  She is taking 100 mg twice daily of metformin .  Seems like at some point she was prescribed Ozempic  but she denies ever using this medication.  She was approved for this medication in February and may benefit from started giving her BMI of 43.  I will restart this medication.   -Continue with metformin  1000 mg twice daily -Start Ozempic  -cw statin   Hypothyroidism TSH 8 months ago was within normal.  Patient is on levothyroxine  150 mcg.  She should continue on this.  Psoriasis She has a history of psoriasis and recently saw dermatology.  She was given triamcinolone  times a day.  This had about 80% of her body psoriasis.  Recently saw dermatology and was given ointment.  She has been using this but she has been more itchy.  I do not see any scale at this time or signs of tinea cruris.  She may benefit from following up with dermatology earlier.  For now she will continue on triamcinolone  twice daily but may benefit from an immunomodulators given her extensive disease.  -continue to follow up with Dermatology   Screening for cervical cancer Screening for cervical cancer and STIs today as she states that she has been having vaginal itch and bleeding with sexual intercourse.  She has a history of abnormal Pap in 2019. Had a repeat pap in 2022 and this was negative. However now she is experiencing post-coital bleeding and itch. Has remote history of chlamydia in her 30s. Grape-like cluser in vaginal canal noted in 2019. Never has had children. Sexually active but does not use barrier contraception. Denies having any ulcers although sometimes she feels she does have  perineal stinging. On exam there were no ulcers, growths or skin lesions. She does have fusion of the labia and vaginal atrophy which may be contributing to her symptoms. Has not tried lube yet but bought it.   -Pap smear today  -GC, BV, and trichomonas testing  today  -HIV RPR -May benefit from endometrial ultrasound if bleeding continues   Hypertension associated with diabetes (HCC) BP today well controlled at 128/68. States she has only been taking losartan  50mg  daily. She is also metoprolol  succinate 100mg  daily.   -cw the above  Routine screening for STI (sexually transmitted infection) See cervical cancer screening  Jose Ngo, MD [facility] Internal Medicine, PGY-1 Phone: [phone] Date 07/13/2023 Time 6:29 PM

## 2023-07-13 NOTE — Assessment & Plan Note (Signed)
 BP today well controlled at 128/68. States she has only been taking losartan  50mg  daily. She is also metoprolol  succinate 100mg  daily.   -cw the above

## 2023-07-14 ENCOUNTER — Other Ambulatory Visit: Payer: Self-pay | Admitting: Student

## 2023-07-14 DIAGNOSIS — B3731 Acute candidiasis of vulva and vagina: Secondary | ICD-10-CM

## 2023-07-14 LAB — CERVICOVAGINAL ANCILLARY ONLY
Bacterial Vaginitis (gardnerella): NEGATIVE
Candida Glabrata: NEGATIVE
Candida Vaginitis: POSITIVE — AB
Chlamydia: NEGATIVE
Comment: NEGATIVE
Comment: NEGATIVE
Comment: NEGATIVE
Comment: NEGATIVE
Comment: NEGATIVE
Comment: NORMAL
Neisseria Gonorrhea: NEGATIVE
Trichomonas: NEGATIVE

## 2023-07-14 LAB — HIV ANTIBODY (ROUTINE TESTING W REFLEX): HIV Screen 4th Generation wRfx: NONREACTIVE

## 2023-07-14 MED ORDER — FLUCONAZOLE 150 MG PO TABS
ORAL_TABLET | ORAL | 0 refills | Status: DC
Start: 2023-07-14 — End: 2023-11-04

## 2023-07-14 NOTE — Progress Notes (Signed)
 Swab positive for vaginal candida. Fluconazole  x2 sent to pharmacy.

## 2023-07-15 ENCOUNTER — Encounter: Payer: Self-pay | Admitting: Student

## 2023-07-15 ENCOUNTER — Telehealth: Payer: Self-pay

## 2023-07-15 ENCOUNTER — Other Ambulatory Visit (HOSPITAL_COMMUNITY): Payer: Self-pay

## 2023-07-15 LAB — CYTOLOGY - PAP
Adequacy: ABSENT
Diagnosis: NEGATIVE
Diagnosis: REACTIVE

## 2023-07-15 MED ORDER — SEMAGLUTIDE(0.25 OR 0.5MG/DOS) 2 MG/3ML ~~LOC~~ SOPN: 0.2500 mg | PEN_INJECTOR | SUBCUTANEOUS | 0 refills | Status: DC

## 2023-07-15 NOTE — Telephone Encounter (Signed)
 Prior Authorization for patient (Ozempic  (0.25 or 0.5 MG/DOSE) 2MG /3ML pen-injectors) came through on cover my meds was submitted with last office notes and labs awaiting approval or denial.  BMW:[id number]

## 2023-07-15 NOTE — Telephone Encounter (Signed)
 Kendra Adams (Key: [id number]) Rx #: [id number] Ozempic  (0.25 or 0.5 MG/DOSE) 2MG Flora Humphreys pen-injectors Form Perform[employer] Commercial / Chief of Staff Prior Authorization Form Created Sent to Plan Plan Response Submit Clinical Questions Determination Favorable Message from Plan Approved. OZEMPIC  (0.25 OR 0.5 MG/DOSE) 2MG /3ML Soln Pen-inj is approved from 07/15/2023 to 07/14/2024. All strengths of the drug are approved.. Authorization Expiration Date: Jul 14, 2024.

## 2023-07-17 LAB — SPECIMEN STATUS REPORT

## 2023-07-17 LAB — RPR: RPR Ser Ql: NONREACTIVE

## 2023-07-20 NOTE — Progress Notes (Signed)
Internal Medicine Clinic Attending  I was physically present during the key portions of the resident provided service and participated in the medical decision making of patient's management care. I reviewed pertinent patient test results.  The assessment, diagnosis, and plan were formulated together and I agree with the documentation in the resident's note.  Williams, Julie Anne, MD  

## 2023-07-24 ENCOUNTER — Other Ambulatory Visit: Payer: Self-pay

## 2023-07-24 DIAGNOSIS — F17200 Nicotine dependence, unspecified, uncomplicated: Secondary | ICD-10-CM

## 2023-07-24 MED ORDER — BUPROPION HCL ER (SR) 150 MG PO TB12: 150.0000 mg | ORAL_TABLET | Freq: Two times a day (BID) | ORAL | 1 refills | Status: DC

## 2023-07-24 NOTE — Telephone Encounter (Signed)
 Prescribed for smoking cessation. One month with one refill provided. Patient will need an appointment to discuss continued need for bupropion  therapy.

## 2023-08-08 ENCOUNTER — Other Ambulatory Visit: Payer: Self-pay | Admitting: Internal Medicine

## 2023-08-08 DIAGNOSIS — Z8669 Personal history of other diseases of the nervous system and sense organs: Secondary | ICD-10-CM

## 2023-08-11 ENCOUNTER — Other Ambulatory Visit: Payer: Self-pay

## 2023-08-11 DIAGNOSIS — E118 Type 2 diabetes mellitus with unspecified complications: Secondary | ICD-10-CM

## 2023-08-11 DIAGNOSIS — R079 Chest pain, unspecified: Secondary | ICD-10-CM

## 2023-08-11 DIAGNOSIS — J40 Bronchitis, not specified as acute or chronic: Secondary | ICD-10-CM

## 2023-08-11 DIAGNOSIS — I2510 Atherosclerotic heart disease of native coronary artery without angina pectoris: Secondary | ICD-10-CM

## 2023-08-11 DIAGNOSIS — I10 Essential (primary) hypertension: Secondary | ICD-10-CM

## 2023-08-11 MED ORDER — ISOSORBIDE MONONITRATE ER 30 MG PO TB24: 15.0000 mg | ORAL_TABLET | Freq: Every day | ORAL | 3 refills | Status: AC

## 2023-08-11 MED ORDER — METOPROLOL SUCCINATE ER 100 MG PO TB24: ORAL_TABLET | ORAL | 0 refills | Status: DC

## 2023-08-11 NOTE — Telephone Encounter (Signed)
 Medication sent to pharmacy

## 2023-08-30 ENCOUNTER — Encounter (INDEPENDENT_AMBULATORY_CARE_PROVIDER_SITE_OTHER): Payer: Self-pay

## 2023-09-02 ENCOUNTER — Other Ambulatory Visit: Payer: Self-pay

## 2023-09-02 DIAGNOSIS — E038 Other specified hypothyroidism: Secondary | ICD-10-CM

## 2023-09-02 MED ORDER — LEVOTHYROXINE SODIUM 150 MCG PO TABS: ORAL_TABLET | ORAL | 3 refills | Status: AC

## 2023-09-02 NOTE — Telephone Encounter (Signed)
 Copied from CRM [id number]. Topic: Clinical - Medication Refill >> Sep 02, 2023  3:00 PM Fredrica W wrote: Medication: levothyroxine  (SYNTHROID ) 150 MCG tablet  Has the patient contacted their pharmacy? Yes (Agent: If no, request that the patient contact the pharmacy for the refill. If patient does not wish to contact the pharmacy document the reason why and proceed with request.) (Agent: If yes, when and what did the pharmacy advise?)  This is the patient's preferred pharmacy:  [facility] [address] [place] - [address] [place] [address] Phone: [phone] Fax: [phone]   Is this the correct pharmacy for this prescription? Yes If no, delete pharmacy and type the correct one.   Has the prescription been filled recently? No  Is the patient out of the medication? Yes - does not have nay for today   Has the patient been seen for an appointment in the last year OR does the patient have an upcoming appointment? Yes  Can we respond through MyChart? Yes  Agent: Please be advised that Rx refills may take up to 3 business days. We ask that you follow-up with your pharmacy.   ----------------------------------------------------------------------- From previous Reason for Contact - Medication Question: Reason for CRM:

## 2023-09-02 NOTE — Telephone Encounter (Signed)
 Medication sent to pharmacy

## 2023-09-03 ENCOUNTER — Other Ambulatory Visit: Payer: Self-pay | Admitting: Internal Medicine

## 2023-09-03 DIAGNOSIS — Z8669 Personal history of other diseases of the nervous system and sense organs: Secondary | ICD-10-CM

## 2023-09-10 ENCOUNTER — Other Ambulatory Visit: Payer: Self-pay | Admitting: Internal Medicine

## 2023-09-10 DIAGNOSIS — Z8669 Personal history of other diseases of the nervous system and sense organs: Secondary | ICD-10-CM

## 2023-09-14 ENCOUNTER — Other Ambulatory Visit: Payer: Self-pay

## 2023-09-14 DIAGNOSIS — E118 Type 2 diabetes mellitus with unspecified complications: Secondary | ICD-10-CM

## 2023-09-14 MED ORDER — SEMAGLUTIDE(0.25 OR 0.5MG/DOS) 2 MG/3ML ~~LOC~~ SOPN: 0.2500 mg | PEN_INJECTOR | SUBCUTANEOUS | 0 refills | Status: DC

## 2023-09-22 ENCOUNTER — Other Ambulatory Visit: Payer: Self-pay

## 2023-09-22 DIAGNOSIS — E785 Hyperlipidemia, unspecified: Secondary | ICD-10-CM

## 2023-09-22 MED ORDER — ATORVASTATIN CALCIUM 80 MG PO TABS: 80.0000 mg | ORAL_TABLET | Freq: Every day | ORAL | 3 refills | Status: AC

## 2023-09-22 NOTE — Telephone Encounter (Signed)
 Medication sent to pharmacy

## 2023-10-22 ENCOUNTER — Ambulatory Visit (INDEPENDENT_AMBULATORY_CARE_PROVIDER_SITE_OTHER): Payer: Self-pay | Admitting: Podiatry

## 2023-10-22 ENCOUNTER — Encounter: Payer: Self-pay | Admitting: Podiatry

## 2023-10-22 VITALS — Ht 62.0 in | Wt 235.3 lb

## 2023-10-22 DIAGNOSIS — L600 Ingrowing nail: Secondary | ICD-10-CM

## 2023-10-22 DIAGNOSIS — R52 Pain, unspecified: Secondary | ICD-10-CM | POA: Diagnosis not present

## 2023-10-22 MED ORDER — CEPHALEXIN 500 MG PO CAPS: 500.0000 mg | ORAL_CAPSULE | Freq: Three times a day (TID) | ORAL | 0 refills | Status: AC

## 2023-10-22 MED ORDER — CEPHALEXIN 500 MG PO CAPS: 500.0000 mg | ORAL_CAPSULE | Freq: Three times a day (TID) | ORAL | 0 refills | Status: DC

## 2023-10-22 MED ORDER — MUPIROCIN 2 % EX OINT
1.0000 | TOPICAL_OINTMENT | Freq: Two times a day (BID) | CUTANEOUS | 2 refills | Status: AC
Start: 2023-10-22 — End: ?

## 2023-10-22 NOTE — Patient Instructions (Addendum)
 HAVE SOMEONE ELSE CHECK THE TEMPERATURE OF THE WATER TO MAKE SURE IT IS NOT TOO HOT OR COLD!  Place 1/4 cup of epsom salts in a quart of warm tap water.  Submerge your foot or feet in the solution and soak for 20 minutes.  This soak should be done twice a day.  Next, remove your foot or feet from solution, blot dry the affected area. Apply ointment and cover if instructed by your doctor.   IF YOUR SKIN BECOMES IRRITATED WHILE USING THESE INSTRUCTIONS, IT IS OKAY TO SWITCH TO  WHITE VINEGAR AND WATER.  As another alternative soak, you may use antibacterial soap and water.  Monitor for any signs/symptoms of infection. Call the office immediately if any occur or go directly to the emergency room. Call with any questions/concerns.

## 2023-10-22 NOTE — Progress Notes (Signed)
 Subjective:   Patient ID: Kendra Adams, female   DOB: 63 y.o.   MRN: [id number]   HPI Chief Complaint  Patient presents with   Ingrown Toenail    Pt is here due to ingrown on the right great toenail, she states it has been there for a couple of weeks and it hurts and she would like it out.   63 year old female presents the office with above concerns, which is new.  She states that the right lateral nail border she points to is causing pain and she would to have this out.  She is nervous that it is going to get infected but she has not yet seen any drainage or pus.  Last A1c 8.4   Review of Systems  All other systems reviewed and are negative.    Past Medical History:  Diagnosis Date   Allergic rhinitis    Asthma    Atopic dermatitis    CAD (coronary artery disease)    a. 12/13/10: mRCA 99%, EF 65-70%;  s/p BMS to mRCA   COPD (chronic obstructive pulmonary disease) (HCC)    Coughing 03/17/2011   Depression    DM2 (diabetes mellitus, type 2) (HCC)    GERD (gastroesophageal reflux disease)    GI bleed    HTN (hypertension)    Hyperlipidemia    Hyperlipidemia    Hypothyroidism    Psoriasis    Seasonal allergies    Tobacco abuse     Past Surgical History:  Procedure Laterality Date   BIOPSY  10/28/2018   Procedure: BIOPSY;  Surgeon: Kristie Lamprey, MD;  Location: [facility] ENDOSCOPY;  Service: Endoscopy;;   CARDIAC CATHETERIZATION N/A 08/14/2015   Procedure: Left Heart Cath and Coronary Angiography;  Surgeon: Peter M Swaziland, MD;  Location: [facility] INVASIVE CV LAB;  Service: Cardiovascular;  Laterality: N/A;   CARPAL TUNNEL RELEASE     w/ bone spurs   CHOLECYSTECTOMY N/A 08/15/2015   Procedure: LAPAROSCOPIC CHOLECYSTECTOMY WITH ATTEMPTED INTRAOPERATIVE CHOLANGIOGRAM;  Surgeon: Debby Shipper, MD;  Location: [facility] OR;  Service: General;  Laterality: N/A;   COLONOSCOPY WITH PROPOFOL  N/A 10/28/2018   Procedure: COLONOSCOPY WITH PROPOFOL ;  Surgeon: Kristie Lamprey, MD;  Location: [facility] ENDOSCOPY;   Service: Endoscopy;  Laterality: N/A;   CORONARY ANGIOPLASTY WITH STENT PLACEMENT     DILATION AND CURETTAGE OF UTERUS     LEFT HEART CATH AND CORONARY ANGIOGRAPHY N/A 11/23/2019   Procedure: LEFT HEART CATH AND CORONARY ANGIOGRAPHY;  Surgeon: Verlin Lonni BIRCH, MD;  Location: [facility] INVASIVE CV LAB;  Service: Cardiovascular;  Laterality: N/A;     Current Outpatient Medications:    acetaminophen  (TYLENOL ) 325 MG tablet, Take 2 tablets (650 mg total) by mouth every 6 (six) hours as needed for mild pain (or Fever >/= 101)., Disp: 30 tablet, Rfl: 0   albuterol  (VENTOLIN  HFA) 108 (90 Base) MCG/ACT inhaler, INHALE 1 TO 2 PUFFS BY MOUTH EVERY 6 HOURS AS NEEDED FOR WHEEZING AND FOR SHORTNESS OF BREATH, Disp: 18 g, Rfl: 0   aspirin  EC 81 MG tablet, Take 1 tablet (81 mg total) by mouth daily. Swallow whole., Disp: 90 tablet, Rfl: 3   atorvastatin  (LIPITOR ) 80 MG tablet, Take 1 tablet (80 mg total) by mouth daily., Disp: 90 tablet, Rfl: 3   betamethasone  dipropionate (DIPROLENE ) 0.05 % cream, Apply topically 2 (two) times daily., Disp: 45 g, Rfl: 2   buPROPion  (WELLBUTRIN  SR) 150 MG 12 hr tablet, Take 1 tablet (150 mg total) by mouth 2 (two) times daily., Disp:  60 tablet, Rfl: 1   cetirizine  (ZYRTEC ) 10 MG tablet, Take 1 tablet (10 mg total) by mouth daily., Disp: 90 tablet, Rfl: 3   diclofenac  Sodium (VOLTAREN ) 1 % GEL, Apply 2 g topically 4 (four) times daily., Disp: 100 g, Rfl: 5   ferrous sulfate  325 (65 FE) MG EC tablet, Take 1 tablet by mouth once daily with breakfast, Disp: 90 tablet, Rfl: 0   fluconazole  (DIFLUCAN ) 150 MG tablet, Take 1 tablet by mouth and it itchiness and discharge continue take a second tablet by mouth 3 days later, Disp: 2 tablet, Rfl: 0   fluticasone  (FLONASE ) 50 MCG/ACT nasal spray, Place 2 sprays into both nostrils daily., Disp: 16 g, Rfl: 2   glucose blood (ACCU-CHEK GUIDE) test strip, Use as instructed, Disp: 50 each, Rfl: 0   Insulin  Pen Needle 32G X 4 MM MISC, Use daily  as diretced, Disp: 100 each, Rfl: 1   isosorbide  mononitrate (IMDUR ) 30 MG 24 hr tablet, Take 0.5 tablets (15 mg total) by mouth daily., Disp: 45 tablet, Rfl: 3   levothyroxine  (SYNTHROID ) 150 MCG tablet, TAKE 1 TABLET BY MOUTH BEFORE BREAKFAST, Disp: 90 tablet, Rfl: 3   lidocaine  (LIDODERM ) 5 %, Place 1 patch onto the skin daily. Remove & Discard patch every 24 hours., Disp: 14 patch, Rfl: 0   losartan  (COZAAR ) 100 MG tablet, Take 0.5 tablets (50 mg total) by mouth at bedtime., Disp: 90 tablet, Rfl: 3   meclizine  (ANTIVERT ) 25 MG tablet, Take 25 mg by mouth as needed for dizziness., Disp: , Rfl:    metFORMIN  (GLUCOPHAGE ) 1000 MG tablet, Take 1 tablet (1,000 mg total) by mouth 2 (two) times daily., Disp: 180 tablet, Rfl: 3   metoprolol  succinate (TOPROL -XL) 100 MG 24 hr tablet, TAKE 1 TABLET BY MOUTH ONCE DAILY WITH MEALS OR  IMMDEDIATELY  AFTER, Disp: 90 tablet, Rfl: 0   nitroGLYCERIN  (NITROSTAT ) 0.4 MG SL tablet, DISSOLVE ONE TABLET UNDER THE TONGUE EVERY 5 MINUTES AS NEEDED FOR CHEST PAIN.  DO NOT EXCEED A TOTAL OF 3 DOSES IN 15 MINUTES. CALL 911, Disp: 25 tablet, Rfl: 0   omeprazole  (PRILOSEC) 40 MG capsule, Take 1 capsule (40 mg total) by mouth daily., Disp: 90 capsule, Rfl: 3   ondansetron  (ZOFRAN ) 4 MG tablet, Take 1 tablet (4 mg total) by mouth every 8 (eight) hours as needed for nausea or vomiting., Disp: 4 tablet, Rfl: 0   rOPINIRole  (REQUIP ) 2 MG tablet, TAKE 1 & 1/2 (ONE & ONE-HALF) TABLETS BY MOUTH AT BEDTIME, Disp: 45 tablet, Rfl: 0   Semaglutide ,0.25 or 0.5MG /DOS, 2 MG/3ML SOPN, Inject 0.25 mg into the skin once a week., Disp: 3 mL, Rfl: 0   sertraline  (ZOLOFT ) 100 MG tablet, Take 1 and 1/2 tablets by mouth once daily, Disp: 135 tablet, Rfl: 3   Spacer/Aero-Holding Chambers (AEROCHAMBER PLUS) inhaler, Use as instructed, Disp: 1 each, Rfl: 2   triamcinolone  ointment (KENALOG ) 0.1 %, Apply 1 Application topically 2 (two) times daily., Disp: , Rfl:   Allergies  Allergen Reactions    Nsaids     IBU - Rectal bleeds; states ASA & Tylenol  OK   Opium  Nausea And Vomiting   Tramadol Hcl     Just doesn't do anything for me   Gabapentin  Swelling and Rash    Skin rash, lip swelling          Objective:  Physical Exam  General: AAO x3, NAD  Dermatological: Right hallux toenail is hypertrophic, dystrophic. There is incurvation on both  the medial and lateral aspect, but the only area of discomfort is on the lateral aspect. There is mild edema and erythema around the lateral hallux nail border. No drainage or purulence.   Vascular: Dorsalis Pedis artery and Posterior Tibial artery pedal pulses are 2/4 bilateral with immedate capillary fill time.  There is no pain with calf compression, swelling, warmth, erythema.   Neruologic: Grossly intact via light touch bilateral.   Musculoskeletal: No other areas of discomfort.  Gait: Unassisted, Nonantalgic.       Assessment:   Right lateral hallux ingrown toenail     Plan:  -Treatment options discussed including all alternatives, risks, and complications -Etiology of symptoms were discussed -At this time, the patient is requesting partial nail removal with chemical matricectomy to the symptomatic portion of the nail. Risks and complications were discussed with the patient for which they understand and written consent was obtained. Under sterile conditions a total of 3 mL of a mixture of 2% lidocaine  plain and 0.5% Marcaine  plain was infiltrated in a hallux block fashion. Once anesthetized, the skin was prepped in sterile fashion. A tourniquet was then applied. Next the lateral aspect of hallux nail border was then sharply excised making sure to remove the entire offending nail border. Once the nails were ensured to be removed area was debrided and the underlying skin was intact. There is no purulence identified in the procedure. Next phenol was then applied under standard conditions and copiously irrigated.  Silvadene was applied.  A dry sterile dressing was applied. After application of the dressing the tourniquet was removed and there is found to be an immediate capillary refill time to the digit. The patient tolerated the procedure well any complications. Post procedure instructions were discussed the patient for which he verbally understood. Discussed signs/symptoms of infection and directed to call the office immediately should any occur or go directly to the emergency room. In the meantime, encouraged to call the office with any questions, concerns, changes symptoms. - Due to the patient being diabetic as well as the pain and concern for infection I did not feel comfortable waiting for an approval by insurance prior to having the procedure done today and having her come back.  Therefore the procedure was done today.  Donnice JONELLE Fees DPM

## 2023-10-30 ENCOUNTER — Other Ambulatory Visit: Payer: Self-pay

## 2023-10-30 DIAGNOSIS — Z8669 Personal history of other diseases of the nervous system and sense organs: Secondary | ICD-10-CM

## 2023-11-01 ENCOUNTER — Encounter

## 2023-11-03 ENCOUNTER — Ambulatory Visit: Payer: Self-pay

## 2023-11-03 NOTE — Telephone Encounter (Signed)
 Pt had to take ciprofloxacin  (CIPRO ) 500 MG tablet for an ingrown toe nail. As a result, she now has a yeast infection. Its been about a week now. She is experiencing itching and discharge, and rawness. She is asking if the doctor can prescribe her a fluconazole  (DIFLUCAN ) 150 MG tablet. This has happened before and normally clears it right up. Please follow up at [phone]. Pt will be leaving town Saturday morning.   First attempt to speak to patient-no answer. Voicemail left for patient to call back to Nurse Triage.

## 2023-11-03 NOTE — Telephone Encounter (Signed)
 Nurse attempted to call patient: no answer: left voicemail to call us  back.  Routing encounter to PCP office for review

## 2023-11-03 NOTE — Telephone Encounter (Signed)
 FYI Only or Action Required?: Action required by provider: Requesting prescription for Diflucan .  Patient was last seen in primary care on 07/13/2023 by Volney Leash, MD.  Called Nurse Triage reporting Vaginitis.  Symptoms began a week ago.  Symptoms are: gradually worsening.  Triage Disposition: Home Care  Patient/caregiver understands and will follow disposition?: Yes         Pt had to take ciprofloxacin  (CIPRO ) 500 MG tablet for an ingrown toe nail. As a result, she now has a yeast infection. Its been about a week now. She is experiencing itching and discharge, and rawness. She is asking if the doctor can prescribe her a fluconazole  (DIFLUCAN ) 150 MG tablet. This has happened before and normally clears it right up. Please follow up at [phone]. Pt will be leaving town Saturday morning.          Reason for Disposition  [1] Symptoms of a yeast infection (i.e., itchy, white discharge, not bad smelling) AND [2] feels like prior vaginal yeast infections  Answer Assessment - Initial Assessment Questions Patient reports symptoms of yeast infection began while taking Cipro  last week and would like a prescription for Diflucan  to treat it. Please advise.        1. SYMPTOM: What's the main symptom you're concerned about? (e.g., pain, itching, dryness)     Itching  3. ONSET: When did the itching start?     About a week ago  4. PAIN: Is there any pain? If Yes, ask: How bad is it? (Scale: 1-10; mild, moderate, severe)     Mild 5. ITCHING: Is there any itching? If Yes, ask: How bad is it? (Scale: 1-10; mild, moderate, severe)     Moderate  6. CAUSE: What do you think is causing the discharge? Have you had the same problem before? What happened then?     Believes due to recent antibiotic  7. OTHER SYMPTOMS: Do you have any other symptoms? (e.g., fever, itching, vaginal bleeding, pain with urination, injury to genital area, vaginal foreign  body)     No  Protocols used: Vaginal Symptoms-A-AH

## 2023-11-03 NOTE — Telephone Encounter (Signed)
Called pt no answer left voicemail to return call 

## 2023-11-04 ENCOUNTER — Other Ambulatory Visit: Payer: Self-pay | Admitting: Student

## 2023-11-04 ENCOUNTER — Ambulatory Visit: Payer: Self-pay

## 2023-11-04 DIAGNOSIS — B3731 Acute candidiasis of vulva and vagina: Secondary | ICD-10-CM

## 2023-11-04 MED ORDER — FLUCONAZOLE 150 MG PO TABS: ORAL_TABLET | ORAL | 0 refills | Status: AC

## 2023-11-04 NOTE — Telephone Encounter (Signed)
 Pt was called x 3 this afternoon - no answer. I left a message on pt's vm medication has been sent to the pharmacy,she can canceled her appt tomorrow unless she has other concerns and to call the office to let us  know or send a My Chart message.

## 2023-11-04 NOTE — Progress Notes (Signed)
 Patient has a history of Candida Vaginitis following antibiotic course.  Patient contacted the clinic with concerns of vaginitis after completing a course of Keflex .  Patient described her symptoms to the triage line as change in discharge, rawness, itching.  With her history of Candida vaginitis following a prior antibiotic course in May 2025, we will prescribe Diflucan  150 mg.  If patient's symptoms do not resolve within 72 hours, she will have an additional Diflucan  150 mg.  If this does not lead to resolution of symptoms, will recommend that patient is seen in the office.

## 2023-11-04 NOTE — Telephone Encounter (Signed)
 Pt has an appt tomorrow 8/27 with Dr harrie.

## 2023-11-04 NOTE — Telephone Encounter (Signed)
 FYI Only or Action Required?: Action required by provider: request for appointment.  Patient was last seen in primary care on 07/13/2023 by Volney Leash, MD.  Called Nurse Triage reporting Vaginal Discharge.  Symptoms began several days ago.  Interventions attempted: Nothing.  Symptoms are: unchanged.  Triage Disposition: See PCP When Office is Open (Within 3 Days)  Patient/caregiver understands and will follow disposition?: Yes     Copied from CRM #[id number]. Topic: Clinical - Red Word Triage >> Nov 04, 2023 11:50 AM Laurier BROCKS wrote: Red Word that prompted transfer to Nurse Triage: Patient states she believes she may have a yeast infection. Patient was on cephALEXin  (KEFLEX ) 500 MG capsule due to an ingrown toenail removal. Patient states on 2nd day after taking meds she started itching and has noticed yeast. Patient states she used vagisil and it has not helped. Patient states she is now in the painful stage where its burning, itching and uncomfortable with sitting down (she wears a diaper during the day). She is wanting to have something else prescribed.  Pain level: 8 Reason for Disposition  [1] Symptoms of a yeast infection (i.e., itchy, white discharge, not bad smelling) AND [2] not improved > 3 days following Care Advice  Answer Assessment - Initial Assessment Questions Scheduled appt tomorrow, no available appts today. Advised call back or UC, if symptoms worsen.    Patient reports have yeast infection, started 2 days after taking antibiotic, keflex , 2 days later itching; completed antibiotic Monday.  1. DISCHARGE: Describe the discharge. (e.g., white, yellow, green, gray, foamy, cottage cheese-like)     No discharge, just itching 2. ODOR: Is there a bad odor?     no 3. ONSET: When did the discharge begin?     8/16 4. RASH: Is there a rash in the genital area? If Yes, ask: Describe it. (e.g., redness, blisters, sores, bumps)     Redness,  scratches/scant bleeding,  5. ABDOMEN PAIN: Are you having any abdomen pain? If Yes, ask: What does it feel like?  (e.g., crampy, dull, intermittent, constant)      Discomfort due to ulcerative colitis, not r/t vaginal itching  6. ABDOMEN PAIN SEVERITY: If present, ask: How bad is it? (e.g., Scale 1-10; mild, moderate, or severe)     0 7. CAUSE: What do you think is causing the discharge? Have you had the same problem before? What happened then?     antibiotic 8. OTHER SYMPTOMS: Do you have any other symptoms? (e.g., fever, itching, urination pain, vaginal bleeding, vaginal foreign body)     Denies fever, pain with urination, no foul smelling urine, clear and yellow Reports chills intermittent, 3 days ago. Hx: copd, DM  Protocols used: Vaginal Discharge-A-AH

## 2023-11-05 ENCOUNTER — Other Ambulatory Visit: Payer: Self-pay

## 2023-11-05 ENCOUNTER — Encounter: Payer: Self-pay | Admitting: Student

## 2023-11-05 ENCOUNTER — Other Ambulatory Visit (HOSPITAL_COMMUNITY)
Admission: RE | Admit: 2023-11-05 | Discharge: 2023-11-05 | Disposition: A | Discharge status: Home / Self Care | Source: Ambulatory Visit | Attending: Family Medicine | Admitting: Family Medicine

## 2023-11-05 ENCOUNTER — Ambulatory Visit (INDEPENDENT_AMBULATORY_CARE_PROVIDER_SITE_OTHER): Payer: Self-pay | Admitting: Student

## 2023-11-05 VITALS — BP 117/76 | HR 70 | Temp 98.2°F | Ht 62.0 in | Wt 233.6 lb

## 2023-11-05 DIAGNOSIS — Z7985 Long-term (current) use of injectable non-insulin antidiabetic drugs: Secondary | ICD-10-CM

## 2023-11-05 DIAGNOSIS — R29818 Other symptoms and signs involving the nervous system: Secondary | ICD-10-CM

## 2023-11-05 DIAGNOSIS — Z7984 Long term (current) use of oral hypoglycemic drugs: Secondary | ICD-10-CM

## 2023-11-05 DIAGNOSIS — F17210 Nicotine dependence, cigarettes, uncomplicated: Secondary | ICD-10-CM

## 2023-11-05 DIAGNOSIS — F17200 Nicotine dependence, unspecified, uncomplicated: Secondary | ICD-10-CM

## 2023-11-05 DIAGNOSIS — N760 Acute vaginitis: Secondary | ICD-10-CM | POA: Insufficient documentation

## 2023-11-05 DIAGNOSIS — E118 Type 2 diabetes mellitus with unspecified complications: Secondary | ICD-10-CM

## 2023-11-05 DIAGNOSIS — Z1231 Encounter for screening mammogram for malignant neoplasm of breast: Secondary | ICD-10-CM

## 2023-11-05 LAB — POCT GLYCOSYLATED HEMOGLOBIN (HGB A1C): HbA1c, POC (controlled diabetic range): 9 % — AB (ref 0.0–7.0)

## 2023-11-05 LAB — GLUCOSE, CAPILLARY: Glucose-Capillary: 214 mg/dL — ABNORMAL HIGH (ref 70–99)

## 2023-11-05 MED ORDER — OZEMPIC (2 MG/DOSE) 8 MG/3ML ~~LOC~~ SOPN: 2.0000 mg | PEN_INJECTOR | SUBCUTANEOUS | 1 refills | Status: AC

## 2023-11-05 MED ORDER — SEMAGLUTIDE (1 MG/DOSE) 4 MG/3ML ~~LOC~~ SOPN: 1.0000 mg | PEN_INJECTOR | SUBCUTANEOUS | 0 refills | Status: AC

## 2023-11-05 MED ORDER — BUPROPION HCL ER (SR) 150 MG PO TB12: 150.0000 mg | ORAL_TABLET | Freq: Two times a day (BID) | ORAL | 1 refills | Status: DC

## 2023-11-05 MED ORDER — OZEMPIC (2 MG/DOSE) 8 MG/3ML ~~LOC~~ SOPN: 2.0000 mg | PEN_INJECTOR | SUBCUTANEOUS | 1 refills | Status: DC

## 2023-11-05 MED ORDER — SEMAGLUTIDE(0.25 OR 0.5MG/DOS) 2 MG/3ML ~~LOC~~ SOPN: 0.5000 mg | PEN_INJECTOR | SUBCUTANEOUS | 0 refills | Status: AC

## 2023-11-05 NOTE — Telephone Encounter (Signed)
 Called pt again about her appt; no answer, left message to call the office.

## 2023-11-05 NOTE — Assessment & Plan Note (Signed)
 She is very motivated motivated to quit.  Has tried cold [place] but no luck.  In the past was on bupropion  which helped, although she did not stop entirely.  Presently smoking 1/2 pack/day. - Bupropion  150 twice daily her 12-week course with half dose on the first 3 days

## 2023-11-05 NOTE — Patient Instructions (Addendum)
 Use fluconazole  for your yeast infection.  Start increasing your ozempic  dose. Inject 0.5mg  weekly for four weeks. Then inject 1mg  weekly for four weeks. Then inject 2mg  weekly for four weeks  Start wellbutrin . For the first three days, take only one pill daily. Then take two pills daily for 12 weeks. Stop smoking within 2 weeks of starting the medicine.  Please return for a checkup in 3 months.

## 2023-11-05 NOTE — Assessment & Plan Note (Addendum)
 Reports itching and mild white and thick discharge which is typical for her yeast infections.  Also recently took a course of ciprofloxacin  for UTI which she shares tends to induce this.  Has already had a course of Diflucan  called in but not yet started.  Did want to be evaluated today. - Collect cervicovaginal ancillary testing, she will proceed with diflucan  treatment

## 2023-11-05 NOTE — Progress Notes (Signed)
 Internal Medicine Clinic Attending  Case discussed with the resident at the time of the visit.  We reviewed the resident's history and exam and pertinent patient test results.  I agree with the assessment, diagnosis, and plan of care documented in the resident's note.

## 2023-11-05 NOTE — Addendum Note (Signed)
 Addended by: HARRIE BRUCKNER on: 11/05/2023 01:27 PM   Modules accepted: Orders

## 2023-11-05 NOTE — Progress Notes (Addendum)
   CC: Yeast infection, diabetes checkup  HPI:  [name] is a 63 y.o. female with a PMH stated below who presents today for a checkup of her diabetes and has acute concern of a yeast infection.  Please see problem based assessment and plan for additional details.  Past Medical History:  Diagnosis Date   Allergic rhinitis    Asthma    Atopic dermatitis    CAD (coronary artery disease)    a. 12/13/10: mRCA 99%, EF 65-70%;  s/p BMS to mRCA   COPD (chronic obstructive pulmonary disease) (HCC)    Coughing 03/17/2011   Depression    DM2 (diabetes mellitus, type 2) (HCC)    GERD (gastroesophageal reflux disease)    GI bleed    HTN (hypertension)    Hyperlipidemia    Hyperlipidemia    Hypothyroidism    Psoriasis    Seasonal allergies    Tobacco abuse    Review of Systems: ROS negative except for what is noted on the assessment and plan.  Vitals:   11/05/23 1032 11/05/23 1106  BP: 139/80 117/76  Pulse: 71 70  Temp: 98.2 F (36.8 C)   TempSrc: Oral   SpO2: 94%   Weight: 233 lb 9.6 oz (106 kg)   Height: 5' 2 (1.575 m)    Physical Exam: Constitutional: well-appearing woman in no acute distress Cardiovascular: regular rate and rhythm, no m/r/g Pulmonary/Chest: normal work of breathing on room air, lungs clear to auscultation bilaterally Skin: warm and dry Psych: normal mood and behavior  Assessment & Plan:   Patient discussed with Dr. Francesco  Diabetes mellitus type 2 with complications (HCC) Upward trend in A1c, now 9 up from 7 at the beginning of this year.  Reports compliance with her medicines and no side effects or financial difficulties to prevent regular adherence.  I do believe she is adherent.  Will start increasing her doses. - Continue metformin  1000 twice daily - Increase Ozempic  from 0.25 to 0.5 weekly x 4 weeks then 1 mg weekly x 4 weeks then 2 mg weekly x 4 weeks  Tobacco use disorder She is very motivated motivated to quit.  Has tried cold  [place] but no luck.  In the past was on bupropion  which helped, although she did not stop entirely.  Presently smoking 1/2 pack/day. - Bupropion  150 twice daily her 12-week course with half dose on the first 3 days  Vaginitis Reports itching and mild white and thick discharge which is typical for her yeast infections.  Also recently took a course of ciprofloxacin  for UTI which she shares tends to induce this.  Has already had a course of Diflucan  called in but not yet started.  Did want to be evaluated today. - Collect cervicovaginal ancillary testing, she will proceed with diflucan  treatment  Suspected sleep apnea At risk for sleep apnea given her body habitus, history of snoring, daytime sleepiness.  Not sure if she is ever woken up gasping for air.  STOP-BANG is 5 on my calculation based on age, BMI, hypertension, daytime fatigue, snoring. -Refer for sleep study  Screening mammogram ordered today.  Return to clinic in 3 months for diabetes and smoking.  At that point she will have completed a course of bupropion  and will be in the midst of titration of Ozempic .  Kendra Adams, D.O. [facility] Internal Medicine, PGY-2 Phone: [phone] Date 11/05/2023 Time 1:27 PM

## 2023-11-05 NOTE — Telephone Encounter (Signed)
 Return call from pt - stated she will like to keep her appt this am. Stated she's going to the beach this w/e and will like to see the doctor before the trip. Appt this am @ 1015Am w/Dr Harrie.

## 2023-11-05 NOTE — Assessment & Plan Note (Signed)
 Upward trend in A1c, now 9 up from 7 at the beginning of this year.  Reports compliance with her medicines and no side effects or financial difficulties to prevent regular adherence.  I do believe she is adherent.  Will start increasing her doses. - Continue metformin  1000 twice daily - Increase Ozempic  from 0.25 to 0.5 weekly x 4 weeks then 1 mg weekly x 4 weeks then 2 mg weekly x 4 weeks

## 2023-11-05 NOTE — Assessment & Plan Note (Signed)
 At risk for sleep apnea given her body habitus, history of snoring, daytime sleepiness.  Not sure if she is ever woken up gasping for air.  STOP-BANG is 5 on my calculation based on age, BMI, hypertension, daytime fatigue, snoring. -Refer for sleep study

## 2023-11-06 ENCOUNTER — Ambulatory Visit: Payer: Self-pay | Admitting: Student

## 2023-11-06 LAB — CERVICOVAGINAL ANCILLARY ONLY
Bacterial Vaginitis (gardnerella): POSITIVE — AB
Candida Glabrata: NEGATIVE
Candida Vaginitis: POSITIVE — AB
Chlamydia: NEGATIVE
Comment: NEGATIVE
Comment: NEGATIVE
Comment: NEGATIVE
Comment: NEGATIVE
Comment: NEGATIVE
Comment: NORMAL
Neisseria Gonorrhea: NEGATIVE
Trichomonas: NEGATIVE

## 2023-11-06 MED ORDER — METRONIDAZOLE 500 MG PO TABS: 500.0000 mg | ORAL_TABLET | Freq: Two times a day (BID) | ORAL | 0 refills | Status: AC

## 2023-11-06 NOTE — Addendum Note (Signed)
 Addended by: HARRIE BRUCKNER on: 11/06/2023 02:53 PM   Modules accepted: Orders

## 2023-11-16 ENCOUNTER — Ambulatory Visit: Admitting: Podiatry

## 2023-11-18 ENCOUNTER — Ambulatory Visit

## 2023-11-19 ENCOUNTER — Encounter (HOSPITAL_COMMUNITY): Payer: Self-pay

## 2023-11-19 ENCOUNTER — Emergency Department (HOSPITAL_COMMUNITY)

## 2023-11-19 ENCOUNTER — Emergency Department (HOSPITAL_COMMUNITY)
Admission: EM | Admit: 2023-11-19 | Discharge: 2023-11-19 | Disposition: A | Discharge status: Home / Self Care | Attending: Emergency Medicine | Admitting: Emergency Medicine

## 2023-11-19 ENCOUNTER — Other Ambulatory Visit: Payer: Self-pay

## 2023-11-19 DIAGNOSIS — Z955 Presence of coronary angioplasty implant and graft: Secondary | ICD-10-CM | POA: Diagnosis not present

## 2023-11-19 DIAGNOSIS — E119 Type 2 diabetes mellitus without complications: Secondary | ICD-10-CM | POA: Insufficient documentation

## 2023-11-19 DIAGNOSIS — Y92410 Unspecified street and highway as the place of occurrence of the external cause: Secondary | ICD-10-CM | POA: Diagnosis not present

## 2023-11-19 DIAGNOSIS — R519 Headache, unspecified: Secondary | ICD-10-CM | POA: Insufficient documentation

## 2023-11-19 DIAGNOSIS — I10 Essential (primary) hypertension: Secondary | ICD-10-CM | POA: Insufficient documentation

## 2023-11-19 DIAGNOSIS — I2510 Atherosclerotic heart disease of native coronary artery without angina pectoris: Secondary | ICD-10-CM | POA: Diagnosis not present

## 2023-11-19 DIAGNOSIS — F17200 Nicotine dependence, unspecified, uncomplicated: Secondary | ICD-10-CM | POA: Insufficient documentation

## 2023-11-19 DIAGNOSIS — J4489 Other specified chronic obstructive pulmonary disease: Secondary | ICD-10-CM | POA: Diagnosis not present

## 2023-11-19 DIAGNOSIS — S301XXA Contusion of abdominal wall, initial encounter: Secondary | ICD-10-CM | POA: Diagnosis not present

## 2023-11-19 DIAGNOSIS — S3991XA Unspecified injury of abdomen, initial encounter: Secondary | ICD-10-CM | POA: Diagnosis present

## 2023-11-19 DIAGNOSIS — E039 Hypothyroidism, unspecified: Secondary | ICD-10-CM | POA: Insufficient documentation

## 2023-11-19 MED ORDER — ACETAMINOPHEN 500 MG PO TABS
1000.0000 mg | ORAL_TABLET | Freq: Once | ORAL | Status: AC
  Administered 2023-11-19: 1000 mg via ORAL
  Filled 2023-11-19: qty 2
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
## 2023-11-23 ENCOUNTER — Other Ambulatory Visit: Payer: Self-pay | Admitting: Internal Medicine

## 2023-11-23 DIAGNOSIS — I10 Essential (primary) hypertension: Secondary | ICD-10-CM

## 2023-11-23 DIAGNOSIS — I2510 Atherosclerotic heart disease of native coronary artery without angina pectoris: Secondary | ICD-10-CM

## 2023-11-23 DIAGNOSIS — E118 Type 2 diabetes mellitus with unspecified complications: Secondary | ICD-10-CM

## 2023-11-24 NOTE — Telephone Encounter (Signed)
 Medication sent to pharmacy

## 2023-12-01 ENCOUNTER — Telehealth: Payer: Self-pay

## 2023-12-01 NOTE — Telephone Encounter (Signed)
 Called pt to ask a few questions and make an appt ... Please  forward call to me .Thanks in advance

## 2023-12-03 ENCOUNTER — Other Ambulatory Visit: Payer: Self-pay

## 2023-12-03 DIAGNOSIS — Z8669 Personal history of other diseases of the nervous system and sense organs: Secondary | ICD-10-CM

## 2023-12-17 ENCOUNTER — Ambulatory Visit (INDEPENDENT_AMBULATORY_CARE_PROVIDER_SITE_OTHER): Admitting: Neurology

## 2023-12-17 ENCOUNTER — Encounter: Payer: Self-pay | Admitting: Neurology

## 2023-12-17 VITALS — BP 147/72 | HR 65 | Ht 63.0 in | Wt 236.0 lb

## 2023-12-17 DIAGNOSIS — Z9189 Other specified personal risk factors, not elsewhere classified: Secondary | ICD-10-CM

## 2023-12-17 DIAGNOSIS — G4719 Other hypersomnia: Secondary | ICD-10-CM

## 2023-12-17 DIAGNOSIS — R0683 Snoring: Secondary | ICD-10-CM | POA: Diagnosis not present

## 2023-12-17 DIAGNOSIS — F17210 Nicotine dependence, cigarettes, uncomplicated: Secondary | ICD-10-CM | POA: Diagnosis not present
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
## 2023-12-17 NOTE — Patient Instructions (Signed)

## 2023-12-29 ENCOUNTER — Telehealth: Payer: Self-pay | Admitting: Neurology

## 2023-12-29 NOTE — Telephone Encounter (Signed)
 [facility] pending

## 2024-01-07 NOTE — Telephone Encounter (Signed)
 [facility] no auth req via fax

## 2024-01-12 ENCOUNTER — Other Ambulatory Visit: Payer: Self-pay

## 2024-01-12 DIAGNOSIS — Z8669 Personal history of other diseases of the nervous system and sense organs: Secondary | ICD-10-CM

## 2024-01-13 MED ORDER — ROPINIROLE HCL 2 MG PO TABS
2.0000 mg | ORAL_TABLET | Freq: Every day | ORAL | 3 refills | Status: AC
Start: 2024-01-13 — End: ?

## 2024-01-13 NOTE — Telephone Encounter (Signed)
 01/12/24 [facility] [place]

## 2024-01-13 NOTE — Telephone Encounter (Signed)
 Copied from CRM [id number]. Topic: Clinical - Medication Question >> Jan 13, 2024 12:38 PM Kendra Adams wrote: Reason for CRM: Patient is calling in regarding her rOPINIRole  (REQUIP ) 2 MG tablet medication, patient is completley out and has none for tonight. Patient is asking if this can be sent over today if possible. Advised patient of 3 day turn around window.
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
## 2024-01-15 ENCOUNTER — Ambulatory Visit: Payer: Self-pay

## 2024-01-15 DIAGNOSIS — Z8249 Family history of ischemic heart disease and other diseases of the circulatory system: Secondary | ICD-10-CM

## 2024-01-15 DIAGNOSIS — Z7985 Long-term (current) use of injectable non-insulin antidiabetic drugs: Secondary | ICD-10-CM

## 2024-01-15 DIAGNOSIS — I10 Essential (primary) hypertension: Secondary | ICD-10-CM

## 2024-01-15 DIAGNOSIS — R220 Localized swelling, mass and lump, head: Secondary | ICD-10-CM | POA: Diagnosis not present

## 2024-01-15 DIAGNOSIS — R519 Headache, unspecified: Secondary | ICD-10-CM | POA: Diagnosis not present

## 2024-01-15 DIAGNOSIS — Z79899 Other long term (current) drug therapy: Secondary | ICD-10-CM

## 2024-01-15 DIAGNOSIS — Z7984 Long term (current) use of oral hypoglycemic drugs: Secondary | ICD-10-CM

## 2024-01-15 DIAGNOSIS — F17210 Nicotine dependence, cigarettes, uncomplicated: Secondary | ICD-10-CM

## 2024-01-15 DIAGNOSIS — E118 Type 2 diabetes mellitus with unspecified complications: Secondary | ICD-10-CM

## 2024-01-15 DIAGNOSIS — Z833 Family history of diabetes mellitus: Secondary | ICD-10-CM

## 2024-01-15 DIAGNOSIS — Z1231 Encounter for screening mammogram for malignant neoplasm of breast: Secondary | ICD-10-CM

## 2024-01-15 DIAGNOSIS — E7849 Other hyperlipidemia: Secondary | ICD-10-CM

## 2024-01-15 NOTE — Assessment & Plan Note (Signed)
 Patient already has order for mammogram placed.  She had recent insurance change so has not yet scheduled mammogram.  Provided the patient with number for [facility] imaging breast center to schedule her test. - Mammogram pending

## 2024-01-15 NOTE — Assessment & Plan Note (Addendum)
 Last A1c of 9.0 on 11/05/2023.  Current regimen includes metformin  1000 mg twice daily, Ozempic  2 mg weekly.  She also takes losartan  50 mg daily for hypertension but this also offers renal protective effect.  Do not see SGLT2i on her medication list.  Has not completed diabetes eye exam in the last year.  May consider starting this if proteinuria.  Plan to check BMP for estimated GFR as well as microalbumin/creatinine ratio.  Provided referral to ophthalmology for eye exam. - Continue metformin  1000 mg twice daily - Continue Ozempic  2 mg weekly - Ambulatory referral to ophthalmology for eye exam - BMP, urine microalbumin/creatinine ratio pending

## 2024-01-15 NOTE — Assessment & Plan Note (Signed)
 Last LDL 71 on 11/12/22. Current regimen includes Atorvastatin  80mg  daily. Plan to update lipid panel. If elevated, consider additional medications such as Zetia. - Lipid panel pending
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
## 2024-01-16 LAB — BASIC METABOLIC PANEL WITH GFR
BUN/Creatinine Ratio: 13 (ref 12–28)
BUN: 11 mg/dL (ref 8–27)
CO2: 23 mmol/L (ref 20–29)
Calcium: 8.9 mg/dL (ref 8.7–10.3)
Chloride: 101 mmol/L (ref 96–106)
Creatinine, Ser: 0.85 mg/dL (ref 0.57–1.00)
Glucose: 138 mg/dL — ABNORMAL HIGH (ref 70–99)
Potassium: 4.3 mmol/L (ref 3.5–5.2)
Sodium: 140 mmol/L (ref 134–144)
eGFR: 76 mL/min/1.73 (ref >59)

## 2024-01-16 LAB — LIPID PANEL
Chol/HDL Ratio: 4.7 ratio — ABNORMAL HIGH (ref 0.0–4.4)
Cholesterol, Total: 131 mg/dL (ref 100–199)
HDL: 28 mg/dL — ABNORMAL LOW (ref >39)
LDL Chol Calc (NIH): 76 mg/dL (ref 0–99)
Triglycerides: 152 mg/dL — ABNORMAL HIGH (ref 0–149)
VLDL Cholesterol Cal: 27 mg/dL (ref 5–40)

## 2024-01-17 LAB — MICROALBUMIN / CREATININE URINE RATIO
Creatinine, Urine: 149.6 mg/dL
Microalb/Creat Ratio: 26 mg/g{creat} (ref 0–29)
Microalbumin, Urine: 39.1 ug/mL

## 2024-01-18 ENCOUNTER — Other Ambulatory Visit: Payer: Self-pay

## 2024-01-18 ENCOUNTER — Ambulatory Visit: Payer: Self-pay

## 2024-01-18 DIAGNOSIS — E7849 Other hyperlipidemia: Secondary | ICD-10-CM

## 2024-01-18 MED ORDER — EZETIMIBE 10 MG PO TABS: 10.0000 mg | ORAL_TABLET | Freq: Every day | ORAL | 3 refills | Status: AC

## 2024-01-18 NOTE — Progress Notes (Signed)
 Internal Medicine Clinic Attending  I was physically present during the key portions of the resident provided service and participated in the medical decision making of patient's management care. I reviewed pertinent patient test results.  The assessment, diagnosis, and plan were formulated together and I agree with the documentation in the resident's note.  Dickie La, MD

## 2024-01-18 NOTE — Addendum Note (Signed)
 Addended by: KARNA FELLOWS on: 01/18/2024 09:35 AM   Modules accepted: Level of Service

## 2024-02-10 ENCOUNTER — Telehealth: Payer: Self-pay | Admitting: *Deleted

## 2024-02-10 NOTE — Telephone Encounter (Signed)
 Will forward to [facility], CMA to await forms for doctor to complete Copied from CRM #[id number]. Topic: Clinical - Medical Advice >> Feb 10, 2024 12:11 PM Diannia H wrote: Reason for CRM: Ginnie from [facility] called and is needing to verify members chronic condition. She will be sending over a fax to be completed and sent back. Callback number is [phone] option 1.

## 2024-02-11 NOTE — Telephone Encounter (Signed)
 I called [employer] back to let the them know that the form has been received and it's in the doctor box, waiting for signature. Once form has been signed, I will faxed back.

## 2024-02-15 ENCOUNTER — Other Ambulatory Visit: Payer: Self-pay | Admitting: *Deleted

## 2024-02-15 DIAGNOSIS — E118 Type 2 diabetes mellitus with unspecified complications: Secondary | ICD-10-CM

## 2024-02-15 DIAGNOSIS — I2510 Atherosclerotic heart disease of native coronary artery without angina pectoris: Secondary | ICD-10-CM

## 2024-02-15 DIAGNOSIS — I10 Essential (primary) hypertension: Secondary | ICD-10-CM

## 2024-02-15 MED ORDER — METOPROLOL SUCCINATE ER 100 MG PO TB24: ORAL_TABLET | ORAL | 0 refills | Status: AC

## 2024-02-22 ENCOUNTER — Other Ambulatory Visit: Payer: Self-pay | Admitting: Student

## 2024-02-22 DIAGNOSIS — F17200 Nicotine dependence, unspecified, uncomplicated: Secondary | ICD-10-CM

## 2024-03-12 ENCOUNTER — Other Ambulatory Visit: Payer: Self-pay | Admitting: Student

## 2024-03-12 DIAGNOSIS — E118 Type 2 diabetes mellitus with unspecified complications: Secondary | ICD-10-CM

## 2024-03-14 ENCOUNTER — Telehealth: Payer: Self-pay | Admitting: *Deleted

## 2024-03-14 NOTE — Telephone Encounter (Signed)
 Patient is taking 2 mg dose

## 2024-03-14 NOTE — Telephone Encounter (Signed)
 RTC to Aleona information given fr the referral as well as Address, Phone number and Fax were verified for referral.    Copied from CRM #[id number]. Topic: Referral - Question >> Mar 14, 2024  4:15 PM Miquel SAILOR wrote: Reason for CRM: Aleona from [facility] requesting NPI # for PCP [place] or [place] . Due to no PCP on fileCall back [phone]   Location [address]   [place], [place] [address]   [phone]

## 2024-03-21 ENCOUNTER — Ambulatory Visit: Admitting: Podiatry

## 2024-03-21 DIAGNOSIS — E1165 Type 2 diabetes mellitus with hyperglycemia: Secondary | ICD-10-CM

## 2024-03-21 DIAGNOSIS — M2041 Other hammer toe(s) (acquired), right foot: Secondary | ICD-10-CM | POA: Diagnosis not present

## 2024-03-21 DIAGNOSIS — L84 Corns and callosities: Secondary | ICD-10-CM | POA: Diagnosis not present

## 2024-03-21 NOTE — Patient Instructions (Signed)
 Hammer Toe: What to Know Hammer toe is when your toe bends in a way that makes it look like a hammer. This usually happens to your second, third, or fourth toe. At first, you can straighten the toe, but over time it can get stiff and stay bent. A hammer toe can cause pain when you wear shoes. In some cases, the toe rubs against the shoe and can form bumps called corns or calluses. Early treatments to keep your toe straight may help with pain. As hammer toe gets worse and your toe gets stiff, you may need surgery to fix it. What are the causes? Hammer toe happens when the joint near your feet bends too much. Over time, the bending pulls on the muscles and the tissues that connect muscle to bone (tendons). This can make the muscles and tendons weak and stiff. Wearing shoes that are too narrow in the toe box and don't let your toes stay straight can cause a hammer toe. What increases the risk? You're more likely to get hammer toe if: You're an older female. You wear shoes that are too small or high heels that pinch your toes. Your second toe is longer than your big toe. You hurt your foot or toe. You have: Arthritis. A nerve or muscle problem. Diabetes. Charcot joint. This affects how you walk. Other people in your family have hammer toe. You're a [employer] account planner. What are the signs or symptoms?  Pain in your toe, such as when you walk or wear shoes. A thick patch of skin on the bent part of the toe or between your toes. This patch is called a corn or callus. Redness and a burning feeling on the bent toe. An open sore on top of the bent toe. Not being able to straighten the bent toe. How is this diagnosed? You may be diagnosed based on your symptoms and an exam. Your health care provider will check your toe and try to straighten it. You may also have some tests, such as: A blood test to check for arthritis or diabetes. An X-ray to show how bad the bending is. How is this treated? If you  can still straighten or move your toe, treatment may include: Taping the toe to keep it straight. Using pads and cushions to protect the bent toe. Wearing shoes that have enough room for the toes. Doing exercises to stretch your toe. Taking medicine to help with pain and swelling. Using special insoles to help with the pain and walking. If these treatments don't work or if the toe is very bent, you may need surgery. Common surgeries include: Arthroplasty or osteotomy. Part of the toe joint is remade or removed. This lets the toe straighten. Fusion. The bones are joined together into one long bone. Implantation. Part of the bone is removed. It's replaced with an implant to help your toe move. Flexor tendon transfer. The tendons that curl the toes down are removed. These are called your flexor tendons. Follow these instructions at home: Take your medicines only as told. Exercise as told. You may need to do exercises to straighten or stretch your toe. How is this prevented? Wear shoes that fit well and give your toes enough space. Shoes aren't supposed to hurt. Buy shoes at the end of the day when your feet might be a little bigger. As you age, your shoe size and width might change. Measure both feet. Buy shoes for the larger foot. A shoe repair store might be able  to stretch shoes that feel tight. Do not wear high heels or shoes with pointed toes. Contact a health care provider if: Your pain gets worse. Your toe gets red or swollen. You get an open sore on your toe. This information is not intended to replace advice given to you by your health care provider. Make sure you discuss any questions you have with your health care provider. Document Revised: 03/20/2023 Document Reviewed: 03/20/2023 Elsevier Patient Education  2025 [employer].

## 2024-03-21 NOTE — Progress Notes (Unsigned)
 Subjective:   Patient ID: Kendra Adams, female   DOB: 64 y.o.   MRN: [id number]   HPI Chief Complaint  Patient presents with   Toe Pain    NIDDM Patient with an A1c 9.0 presents today with mass on her second toe of her Right foot patient relates she is experiencing some pain and relates it might have been caused by a injury a few years ago when she tripped over her dog , she never been seen for this before.  Patient is having neuropathic symptoms   64 year old female presents the office today with above concerns.  She said that she has pain to her right second toe and she is interested in surgery to help fix the toe given the contracture and she has a painful corn on the top of the toe.  She was actually seen in urgent care and she asked for a digital block which they did which did help for couple of days of the pain is recurred.  She states that her sugar has been elevated recently given the holidays.  No open lesions or injuries.   Review of Systems  All other systems reviewed and are negative.       Objective:  Physical Exam  General: AAO x3, NAD  Dermatological: Hyperkeratotic tissue on the right second toe dorsal DIPJ without any underlying ulceration, drainage or signs of infection.  There is no open lesions identified otherwise today.  Vascular: Dorsalis Pedis artery and Posterior Tibial artery pedal pulses are 2/4 bilateral with immedate capillary fill time. There is no pain with calf compression, swelling, warmth, erythema.   Neruologic: Grossly intact via light touch bilateral.   Musculoskeletal: Right digital contracture, hammertoe deformity noted of the right second toe with tenderness palpation along the hyperkeratotic lesion.       Assessment:   Hammertoe deformity with preulcerative callus, uncontrolled diabetes     Plan:  -Treatment options discussed including all alternatives, risks, and complications -Etiology of symptoms were discussed - We discussed  with conservative as well as surgical treatment options of the hammertoe however given her uncontrolled diabetes she needs to get this under better control prior to surgery.  A1c needs to be below a 8. -Given the discomfort on the area the callus I anesthetize the toe with 3 mL of lidocaine , Marcaine  plain.  I then sharply debrided hyperkeratotic lesion without any complications or bleeding.  Discussed offloading pads which I dispensed today as well as wider, taller toebox shoes and material to decrease pressure.  No follow-ups on file.  Donnice JONELLE Fees DPM

## 2024-03-22 LAB — HEMOGLOBIN A1C
Hgb A1c MFr Bld: 7 % — ABNORMAL HIGH
Mean Plasma Glucose: 154 mg/dL
eAG (mmol/L): 8.5 mmol/L

## 2024-03-23 ENCOUNTER — Ambulatory Visit: Payer: Self-pay | Admitting: Podiatry

## 2024-03-25 ENCOUNTER — Other Ambulatory Visit: Payer: Self-pay | Admitting: Student

## 2024-03-25 DIAGNOSIS — E118 Type 2 diabetes mellitus with unspecified complications: Secondary | ICD-10-CM

## 2024-03-28 NOTE — Telephone Encounter (Signed)
 Medication discontinued 11/05/2023

## 2024-04-05 ENCOUNTER — Other Ambulatory Visit: Payer: Self-pay

## 2024-04-05 DIAGNOSIS — J309 Allergic rhinitis, unspecified: Secondary | ICD-10-CM

## 2024-04-06 MED ORDER — CETIRIZINE HCL 10 MG PO TABS: 10.0000 mg | ORAL_TABLET | Freq: Every day | ORAL | 3 refills | Status: AC
